# Patient Record
Sex: Male | Born: 1970
Health system: Southern US, Community
[De-identification: ages and names within clinical notes are randomized; demographics above are authoritative.]

## PROBLEM LIST (undated history)

## (undated) DIAGNOSIS — F32A Depression, unspecified: Secondary | ICD-10-CM

## (undated) DIAGNOSIS — F419 Anxiety disorder, unspecified: Secondary | ICD-10-CM

## (undated) DIAGNOSIS — G709 Myoneural disorder, unspecified: Secondary | ICD-10-CM

## (undated) DIAGNOSIS — I4891 Unspecified atrial fibrillation: Secondary | ICD-10-CM

## (undated) DIAGNOSIS — N289 Disorder of kidney and ureter, unspecified: Secondary | ICD-10-CM

## (undated) DIAGNOSIS — D689 Coagulation defect, unspecified: Secondary | ICD-10-CM

## (undated) DIAGNOSIS — D649 Anemia, unspecified: Secondary | ICD-10-CM

## (undated) DIAGNOSIS — R569 Unspecified convulsions: Secondary | ICD-10-CM

## (undated) DIAGNOSIS — R0600 Dyspnea, unspecified: Secondary | ICD-10-CM

## (undated) DIAGNOSIS — E119 Type 2 diabetes mellitus without complications: Secondary | ICD-10-CM

## (undated) DIAGNOSIS — D696 Thrombocytopenia, unspecified: Secondary | ICD-10-CM

## (undated) HISTORY — PX: OTHER SURGICAL HISTORY: SHX169

---

## 2014-09-22 ENCOUNTER — Encounter (HOSPITAL_COMMUNITY): Payer: Self-pay | Admitting: Emergency Medicine

## 2014-09-22 ENCOUNTER — Emergency Department (HOSPITAL_COMMUNITY): Payer: Self-pay

## 2014-09-22 ENCOUNTER — Emergency Department (HOSPITAL_COMMUNITY)
Admission: EM | Admit: 2014-09-22 | Discharge: 2014-09-22 | Disposition: A | Discharge status: Home / Self Care | Payer: Self-pay | Attending: Emergency Medicine | Admitting: Emergency Medicine

## 2014-09-22 DIAGNOSIS — Y939 Activity, unspecified: Secondary | ICD-10-CM | POA: Insufficient documentation

## 2014-09-22 DIAGNOSIS — Y929 Unspecified place or not applicable: Secondary | ICD-10-CM | POA: Insufficient documentation

## 2014-09-22 DIAGNOSIS — S0990XA Unspecified injury of head, initial encounter: Secondary | ICD-10-CM

## 2014-09-22 DIAGNOSIS — Z720 Tobacco use: Secondary | ICD-10-CM | POA: Insufficient documentation

## 2014-09-22 DIAGNOSIS — F1510 Other stimulant abuse, uncomplicated: Secondary | ICD-10-CM | POA: Insufficient documentation

## 2014-09-22 DIAGNOSIS — R55 Syncope and collapse: Secondary | ICD-10-CM | POA: Insufficient documentation

## 2014-09-22 DIAGNOSIS — W1839XA Other fall on same level, initial encounter: Secondary | ICD-10-CM | POA: Insufficient documentation

## 2014-09-22 DIAGNOSIS — S0003XA Contusion of scalp, initial encounter: Secondary | ICD-10-CM | POA: Insufficient documentation

## 2014-09-22 DIAGNOSIS — Y999 Unspecified external cause status: Secondary | ICD-10-CM | POA: Insufficient documentation

## 2014-09-22 DIAGNOSIS — R561 Post traumatic seizures: Secondary | ICD-10-CM | POA: Insufficient documentation

## 2014-09-22 DIAGNOSIS — F1410 Cocaine abuse, uncomplicated: Secondary | ICD-10-CM | POA: Insufficient documentation

## 2014-09-22 LAB — RAPID URINE DRUG SCREEN, HOSP PERFORMED
Amphetamines: POSITIVE — AB
Barbiturates: NOT DETECTED
Benzodiazepines: NOT DETECTED
Cocaine: POSITIVE — AB
Opiates: NOT DETECTED
Tetrahydrocannabinol: POSITIVE — AB

## 2014-09-22 LAB — BASIC METABOLIC PANEL
Anion gap: 10 (ref 5–15)
BUN: 8 mg/dL (ref 6–23)
CO2: 27 mmol/L (ref 19–32)
Calcium: 9.4 mg/dL (ref 8.4–10.5)
Chloride: 99 mmol/L (ref 96–112)
Creatinine, Ser: 0.98 mg/dL (ref 0.50–1.35)
GFR calc Af Amer: >90 mL/min (ref >90)
GFR calc non Af Amer: >90 mL/min (ref >90)
Glucose, Bld: 123 mg/dL — ABNORMAL HIGH (ref 70–99)
Potassium: 3.7 mmol/L (ref 3.5–5.1)
Sodium: 136 mmol/L (ref 135–145)

## 2014-09-22 LAB — CBC
HCT: 39.9 % (ref 39.0–52.0)
Hemoglobin: 13.7 g/dL (ref 13.0–17.0)
MCH: 29.7 pg (ref 26.0–34.0)
MCHC: 34.3 g/dL (ref 30.0–36.0)
MCV: 86.4 fL (ref 78.0–100.0)
Platelets: 209 10*3/uL (ref 150–400)
RBC: 4.62 MIL/uL (ref 4.22–5.81)
RDW: 14.8 % (ref 11.5–15.5)
WBC: 9.3 10*3/uL (ref 4.0–10.5)

## 2014-09-22 LAB — ETHANOL: Alcohol, Ethyl (B): <5 mg/dL (ref 0–9)

## 2014-09-22 LAB — CBG MONITORING, ED: Glucose-Capillary: 131 mg/dL — ABNORMAL HIGH (ref 70–99)

## 2014-09-22 MED ORDER — ACETAMINOPHEN 500 MG PO TABS
1000.0000 mg | ORAL_TABLET | Freq: Once | ORAL | Status: DC
  Filled 2014-09-22: qty 2

## 2014-09-22 NOTE — ED Provider Notes (Signed)
CSN: [id number]     Arrival date & time 09/22/14  2002 History   First MD Initiated Contact with Patient 09/22/14 2017     Chief Complaint  Patient presents with  . Seizures     (Consider location/radiation/quality/duration/timing/severity/associated sxs/prior Treatment) Patient is a 44 y.o. male presenting with head injury. The history is provided by the patient.  Head Injury Location:  Occipital Time since incident:  30 minutes Mechanism of injury: fall   Pain details:    Quality:  Aching   Severity:  Mild   Timing:  Constant   Progression:  Unchanged Chronicity:  New Relieved by:  None tried Worsened by:  Nothing tried Ineffective treatments:  None tried Associated symptoms: loss of consciousness and seizures (possible)   Associated symptoms: no blurred vision, no focal weakness, no nausea, no neck pain and no vomiting     History reviewed. No pertinent past medical history. Past Surgical History  Procedure Laterality Date  . Left kidney removed     No family history on file. History  Substance Use Topics  . Smoking status: Current Every Day Smoker  . Smokeless tobacco: Not on file  . Alcohol Use: 1.8 oz/week    3 Cans of beer per week    Review of Systems  Eyes: Negative for blurred vision.  Gastrointestinal: Negative for nausea and vomiting.  Musculoskeletal: Negative for neck pain.  Neurological: Positive for seizures (possible) and loss of consciousness. Negative for focal weakness.  All other systems reviewed and are negative.     Allergies  Review of patient's allergies indicates no known allergies.  Home Medications   Prior to Admission medications   Not on File   BP 153/94 mmHg  Pulse 81  Temp(Src) 98.2 F (36.8 C) (Oral)  Resp 18  Ht 5\' 4"  (1.626 m)  Wt 150 lb (68.04 kg)  BMI 25.73 kg/m2  SpO2 99% Physical Exam  Constitutional: He is oriented to person, place, and time. He appears well-developed and well-nourished. No distress.  HENT:   Head: Normocephalic.  Mouth/Throat: Oropharynx is clear and moist. No oropharyngeal exudate.  Scalp hematoma with small abrasion to left vertex of scalp. No underlying palpable deformity.  Eyes: Conjunctivae and EOM are normal. Pupils are equal, round, and reactive to light.  Neck: Normal range of motion. Neck supple.  Cardiovascular: Normal rate, regular rhythm, normal heart sounds and intact distal pulses.  Exam reveals no gallop and no friction rub.   No murmur heard. Pulmonary/Chest: Effort normal and breath sounds normal. No respiratory distress. He has no wheezes. He has no rales.  Abdominal: Soft. He exhibits no distension and no mass. There is no tenderness. There is no rebound and no guarding.  Musculoskeletal: Normal range of motion. He exhibits no edema or tenderness.  Lymphadenopathy:    He has no cervical adenopathy.  Neurological: He is alert and oriented to person, place, and time. He has normal strength. No cranial nerve deficit or sensory deficit. He displays a negative Romberg sign. Coordination and gait normal.  Final 5 strength in all extremities and equal. finger-nose-finger intact. Ambulatory without difficulty.  Skin: Skin is warm and dry. No rash noted. He is not diaphoretic.  Psychiatric: He has a normal mood and affect. His behavior is normal. Judgment and thought content normal.  Nursing note and vitals reviewed.   ED Course  Procedures (including critical care time) Labs Review Labs Reviewed  BASIC METABOLIC PANEL - Abnormal; Notable for the following:    Glucose,  Bld 123 (*)    All other components within normal limits  CBG MONITORING, ED - Abnormal; Notable for the following:    Glucose-Capillary 131 (*)    All other components within normal limits  CBC  ETHANOL  URINE RAPID DRUG SCREEN (HOSP PERFORMED)    Imaging Review Ct Head Wo Contrast   (if New Onset Seizure And/or Head Trauma)  09/22/2014   CLINICAL DATA:  Seizure.  EXAM: CT HEAD WITHOUT  CONTRAST  TECHNIQUE: Contiguous axial images were obtained from the base of the skull through the vertex without intravenous contrast.  COMPARISON:  None.  FINDINGS: Bony calvarium appears intact. No mass effect or midline shift is noted. Ventricular size is within normal limits. There is no evidence of mass lesion, hemorrhage or acute infarction.  IMPRESSION: Normal head CT.   Electronically Signed   By: Marijo Conception, M.D.   On: 09/22/2014 20:49     EKG Interpretation   Date/Time:  Thursday September 22 2014 20:12:00 EDT Ventricular Rate:  99 PR Interval:  158 QRS Duration: 82 QT Interval:  370 QTC Calculation: 475 R Axis:   81 Text Interpretation:  Sinus rhythm Abnormal R-wave progression, early  transition Baseline wander in lead(s) V6 No old tracing to compare  Confirmed by MILLER  MD, BRIAN ([id number]) on 09/22/2014 8:20:52 PM      MDM   Final diagnoses:  Head injuries, initial encounter  Seizures, post-traumatic    44 year old male with no past medical history presents with probable syncopal episode followed by possible seizure. He was working on a car when he felt lightheaded and dizzy. Family reports that he then stiffened up and fell backwards hitting his head. He did had less than 1 minute of rhythmic shaking type movements. He was then confused and slow to wake up but alert and oriented in the ambulance with EMS.  Afebrile and stable on arrival. Nonfocal neurologic exam. Differential includes seizure versus posttraumatic seizure versus myoclonic jerks following syncopal lips. EKG is normal sinus rhythm without ischemic changes, arrhythmia, Brugada, short PR intervals. We will obtain CT head as well as basic labs and UDS. C-spine cleared by [facility].  11 PM no further episodes during the emergency department stay. Workup unremarkable and CT head negative. Seizure either secondary to sickle upset with head trauma versus alcohol withdrawal. On exam he has no signs withdrawal and his  vital signs have been stable. He is no other indication for admission and will follow up with PCP. Resources provided.  Larence Penning, MD 09/22/14 [id number]  Noemi Chapel, MD 09/25/14 [phone]

## 2014-09-22 NOTE — Discharge Instructions (Signed)
Seizure, Adult A seizure means there is unusual activity in the brain. A seizure can cause changes in attention or behavior. Seizures often cause shaking (convulsions). Seizures often last from 30 seconds to 2 minutes. HOME CARE   If you are given medicines, take them exactly as told by your doctor.  Keep all doctor visits as told.  Do not swim or drive until your doctor says it is okay.  Teach others what to do if you have a seizure. They should:  Lay you on the ground.  Put a cushion under your head.  Loosen any tight clothing around your neck.  Turn you on your side.  Stay with you until you get better. GET HELP RIGHT AWAY IF:   The seizure lasts longer than 2 to 5 minutes.  The seizure is very bad.  The person does not wake up after the seizure.  The person's attention or behavior changes. Drive the person to the emergency room or call your local emergency services (911 in [place] MAKE SURE YOU:   Understand these instructions.  Will watch your condition.  Will get help right away if you are not doing well or get worse. Document Released: 12/11/2007 Document Revised: 09/16/2011 Document Reviewed: 06/12/2011 [employer] Patient Information 2015 [employer]. This information is not intended to replace advice given to you by your health care provider. Make sure you discuss any questions you have with your health care provider.  

## 2014-09-22 NOTE — ED Notes (Signed)
Per EMS Had witnessed seizure that lasted approx 1 minute. Per ems family stated he was standing up and body tensed up and went to the ground and started shaking all over. Pt has laceration to left back of head. Pt is alert and ox4.

## 2014-09-22 NOTE — ED Provider Notes (Signed)
44 year old male, denies having any medical history, presents after feeling lightheaded, falling and striking the left temporoparietal scalp causing a hematoma, having a resulting seizure like activity lasting approximately 1 minute and then resolving spontaneously. He has no memory of the event, he does endorse drinking daily, states that he did not drink today he does give varying details on this part of the story. He does state he drinks alcohol, possibly a 12 pack, he is very unclear on the amount. He did state that he had mild tremors today, no hallucinations, other than having a headache on the left side where he struck his head he has no other complaints including no fevers chills nausea vomiting diarrhea dysuria or abdominal pain chest pain or shortness of breath. On exam the patient appears calm, he is tearful, he has a 4-5c m hematoma on the left parieto-occipital area of the scalp, no overlying laceration of the skin has broken and a punctate area. No malocclusion, no hemotympanum, no raccoon eyes, or supple neck, no peripheral edema, follows commands without difficulty, soft abdomen, normal strength and sensation. Normal mental status. [name] the patient will need workup for first time seizure, possibly related to alcohol withdrawal, possibly intracranial abnormalities or trauma, electrolytes, labs, reassess. He does not appear to be in acute withdrawal at this time.  I saw and evaluated the patient, reviewed the resident's note and I agree with the findings and plan.   EKG Interpretation  Date/Time:  Thursday September 22 2014 20:12:00 EDT Ventricular Rate:  99 PR Interval:  158 QRS Duration: 82 QT Interval:  370 QTC Calculation: 475 R Axis:   81 Text Interpretation:  Sinus rhythm Abnormal R-wave progression, early transition Baseline wander in lead(s) V6 No old tracing to compare Confirmed by Nichlas Pitera  MD, [place] ([id number]) on 09/22/2014 8:20:52 PM      I personally interpreted the EKG as  well as the resident and agree with the interpretation on the resident's chart.  Final diagnoses:  Head injuries, initial encounter  Seizures, post-traumatic      Noemi Chapel, MD 09/25/14 [phone]

## 2014-09-30 ENCOUNTER — Emergency Department (HOSPITAL_COMMUNITY)
Admission: EM | Admit: 2014-09-30 | Discharge: 2014-09-30 | Discharge status: Against Medical Advice | Payer: No Typology Code available for payment source | Attending: Emergency Medicine | Admitting: Emergency Medicine

## 2014-09-30 ENCOUNTER — Emergency Department (HOSPITAL_COMMUNITY): Payer: No Typology Code available for payment source

## 2014-09-30 ENCOUNTER — Encounter (HOSPITAL_COMMUNITY): Payer: Self-pay | Admitting: Emergency Medicine

## 2014-09-30 DIAGNOSIS — Z720 Tobacco use: Secondary | ICD-10-CM | POA: Insufficient documentation

## 2014-09-30 DIAGNOSIS — I472 Ventricular tachycardia, unspecified: Secondary | ICD-10-CM

## 2014-09-30 DIAGNOSIS — S0990XA Unspecified injury of head, initial encounter: Secondary | ICD-10-CM | POA: Diagnosis present

## 2014-09-30 DIAGNOSIS — Z87448 Personal history of other diseases of urinary system: Secondary | ICD-10-CM | POA: Diagnosis not present

## 2014-09-30 DIAGNOSIS — Y92410 Unspecified street and highway as the place of occurrence of the external cause: Secondary | ICD-10-CM | POA: Diagnosis not present

## 2014-09-30 DIAGNOSIS — S0181XA Laceration without foreign body of other part of head, initial encounter: Secondary | ICD-10-CM | POA: Diagnosis not present

## 2014-09-30 DIAGNOSIS — Y999 Unspecified external cause status: Secondary | ICD-10-CM | POA: Insufficient documentation

## 2014-09-30 DIAGNOSIS — Y939 Activity, unspecified: Secondary | ICD-10-CM | POA: Insufficient documentation

## 2014-09-30 HISTORY — DX: Unspecified convulsions: R56.9

## 2014-09-30 HISTORY — DX: Disorder of kidney and ureter, unspecified: N28.9

## 2014-09-30 LAB — ETHANOL: Alcohol, Ethyl (B): 361 mg/dL — ABNORMAL HIGH (ref 0–9)

## 2014-09-30 LAB — BASIC METABOLIC PANEL
Anion gap: 12 (ref 5–15)
BUN: 14 mg/dL (ref 6–23)
CO2: 24 mmol/L (ref 19–32)
Calcium: 8.2 mg/dL — ABNORMAL LOW (ref 8.4–10.5)
Chloride: 103 mmol/L (ref 96–112)
Creatinine, Ser: 1.35 mg/dL (ref 0.50–1.35)
GFR calc Af Amer: 74 mL/min — ABNORMAL LOW (ref >90)
GFR calc non Af Amer: 64 mL/min — ABNORMAL LOW (ref >90)
Glucose, Bld: 95 mg/dL (ref 70–99)
Potassium: 3.9 mmol/L (ref 3.5–5.1)
Sodium: 139 mmol/L (ref 135–145)

## 2014-09-30 LAB — CBC
HCT: 42.7 % (ref 39.0–52.0)
Hemoglobin: 14.6 g/dL (ref 13.0–17.0)
MCH: 29.9 pg (ref 26.0–34.0)
MCHC: 34.2 g/dL (ref 30.0–36.0)
MCV: 87.5 fL (ref 78.0–100.0)
Platelets: 244 10*3/uL (ref 150–400)
RBC: 4.88 MIL/uL (ref 4.22–5.81)
RDW: 15.1 % (ref 11.5–15.5)
WBC: 6.2 10*3/uL (ref 4.0–10.5)

## 2014-09-30 LAB — ACETAMINOPHEN LEVEL: Acetaminophen (Tylenol), Serum: <10 ug/mL — ABNORMAL LOW (ref 10–30)

## 2014-09-30 LAB — TROPONIN I: Troponin I: <0.03 ng/mL (ref <0.031)

## 2014-09-30 LAB — SALICYLATE LEVEL: Salicylate Lvl: <4 mg/dL (ref 2.8–20.0)

## 2014-09-30 MED ORDER — AMIODARONE HCL IN DEXTROSE 360-4.14 MG/200ML-% IV SOLN
60.0000 mg/h | Freq: Once | INTRAVENOUS | Status: AC
  Administered 2014-09-30: 60 mg/h via INTRAVENOUS
  Filled 2014-09-30: qty 200

## 2014-09-30 MED ORDER — AMIODARONE LOAD VIA INFUSION
150.0000 mg | Freq: Once | INTRAVENOUS | Status: AC
  Administered 2014-09-30: 150 mg via INTRAVENOUS
  Filled 2014-09-30: qty 83.34

## 2014-09-30 MED ORDER — SODIUM CHLORIDE 0.9 % IV BOLUS (SEPSIS)
1000.0000 mL | Freq: Once | INTRAVENOUS | Status: AC
  Administered 2014-09-30: 1000 mL via INTRAVENOUS

## 2014-09-30 MED ORDER — AMIODARONE IV BOLUS ONLY 150 MG/100ML: 150.0000 mg | Freq: Once | INTRAVENOUS | Status: DC

## 2014-09-30 MED ORDER — IOHEXOL 300 MG/ML  SOLN
100.0000 mL | Freq: Once | INTRAMUSCULAR | Status: AC | PRN
  Administered 2014-09-30: 100 mL via INTRAVENOUS
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
## 2014-09-30 NOTE — ED Notes (Signed)
Patient pulling at wires, pulled out iv's,x 2 uncooperative with staff.  IV catheters located, tip noted to be intact, bleeding controlled. Pt states "i need to find my car and keys, i need to get out of here, i don't have time for this".  Pt redirected, plan of care explained, pt placed back in bed and reattached to monitor, dr Mingo Amber notified, at bedside.
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
## 2014-09-30 NOTE — ED Notes (Signed)
Backboard removed by Dr. Mingo Amber.
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
## 2014-09-30 NOTE — ED Notes (Signed)
Patient transported to CT via stretcher.

## 2014-09-30 NOTE — ED Notes (Signed)
Pt returned from ct, in bed on monitor.  Pt repeatedly attempting to leave bed, stating he is going to get his clothes and his keys and his wallet.  Pt redirected and reoriented, resting in bed at this time

## 2015-05-18 ENCOUNTER — Encounter (HOSPITAL_COMMUNITY): Payer: Self-pay | Admitting: Neurology

## 2015-05-18 ENCOUNTER — Emergency Department (HOSPITAL_COMMUNITY): Payer: Self-pay

## 2015-05-18 ENCOUNTER — Emergency Department (HOSPITAL_COMMUNITY)
Admission: EM | Admit: 2015-05-18 | Discharge: 2015-05-18 | Disposition: A | Discharge status: Home / Self Care | Payer: Self-pay | Attending: Emergency Medicine | Admitting: Emergency Medicine

## 2015-05-18 DIAGNOSIS — R569 Unspecified convulsions: Secondary | ICD-10-CM | POA: Insufficient documentation

## 2015-05-18 DIAGNOSIS — S0993XA Unspecified injury of face, initial encounter: Secondary | ICD-10-CM | POA: Insufficient documentation

## 2015-05-18 DIAGNOSIS — R42 Dizziness and giddiness: Secondary | ICD-10-CM | POA: Insufficient documentation

## 2015-05-18 DIAGNOSIS — X58XXXA Exposure to other specified factors, initial encounter: Secondary | ICD-10-CM | POA: Insufficient documentation

## 2015-05-18 DIAGNOSIS — Y998 Other external cause status: Secondary | ICD-10-CM | POA: Insufficient documentation

## 2015-05-18 DIAGNOSIS — T300 Burn of unspecified body region, unspecified degree: Secondary | ICD-10-CM

## 2015-05-18 DIAGNOSIS — T23271A Burn of second degree of right wrist, initial encounter: Secondary | ICD-10-CM | POA: Insufficient documentation

## 2015-05-18 DIAGNOSIS — Y9389 Activity, other specified: Secondary | ICD-10-CM | POA: Insufficient documentation

## 2015-05-18 DIAGNOSIS — T22231A Burn of second degree of right upper arm, initial encounter: Secondary | ICD-10-CM | POA: Insufficient documentation

## 2015-05-18 DIAGNOSIS — Z720 Tobacco use: Secondary | ICD-10-CM | POA: Insufficient documentation

## 2015-05-18 DIAGNOSIS — Y9289 Other specified places as the place of occurrence of the external cause: Secondary | ICD-10-CM | POA: Insufficient documentation

## 2015-05-18 DIAGNOSIS — X16XXXA Contact with hot heating appliances, radiators and pipes, initial encounter: Secondary | ICD-10-CM | POA: Insufficient documentation

## 2015-05-18 LAB — BASIC METABOLIC PANEL
Anion gap: 17 — ABNORMAL HIGH (ref 5–15)
BUN: <5 mg/dL — ABNORMAL LOW (ref 6–20)
CO2: 21 mmol/L — ABNORMAL LOW (ref 22–32)
Calcium: 9.3 mg/dL (ref 8.9–10.3)
Chloride: 94 mmol/L — ABNORMAL LOW (ref 101–111)
Creatinine, Ser: 1 mg/dL (ref 0.61–1.24)
GFR calc Af Amer: >60 mL/min (ref >60)
GFR calc non Af Amer: >60 mL/min (ref >60)
Glucose, Bld: 130 mg/dL — ABNORMAL HIGH (ref 65–99)
Potassium: 3.3 mmol/L — ABNORMAL LOW (ref 3.5–5.1)
Sodium: 132 mmol/L — ABNORMAL LOW (ref 135–145)

## 2015-05-18 LAB — CBC WITH DIFFERENTIAL/PLATELET
Basophils Absolute: 0 10*3/uL (ref 0.0–0.1)
Basophils Relative: 0 %
Eosinophils Absolute: 0 10*3/uL (ref 0.0–0.7)
Eosinophils Relative: 0 %
HCT: 40.2 % (ref 39.0–52.0)
Hemoglobin: 13.8 g/dL (ref 13.0–17.0)
Lymphocytes Relative: 12 %
Lymphs Abs: 1.1 10*3/uL (ref 0.7–4.0)
MCH: 30.8 pg (ref 26.0–34.0)
MCHC: 34.3 g/dL (ref 30.0–36.0)
MCV: 89.7 fL (ref 78.0–100.0)
Monocytes Absolute: 0.9 10*3/uL (ref 0.1–1.0)
Monocytes Relative: 10 %
Neutro Abs: 7.1 10*3/uL (ref 1.7–7.7)
Neutrophils Relative %: 78 %
Platelets: 184 10*3/uL (ref 150–400)
RBC: 4.48 MIL/uL (ref 4.22–5.81)
RDW: 14 % (ref 11.5–15.5)
WBC: 9.1 10*3/uL (ref 4.0–10.5)

## 2015-05-18 MED ORDER — SODIUM CHLORIDE 0.9 % IV BOLUS (SEPSIS)
1000.0000 mL | Freq: Once | INTRAVENOUS | Status: AC
  Administered 2015-05-18: 1000 mL via INTRAVENOUS

## 2015-05-18 MED ORDER — LEVETIRACETAM 500 MG PO TABS
500.0000 mg | ORAL_TABLET | Freq: Two times a day (BID) | ORAL | Status: DC
Start: 2015-05-18 — End: 2018-05-26

## 2015-05-18 MED ORDER — GADOBENATE DIMEGLUMINE 529 MG/ML IV SOLN
15.0000 mL | Freq: Once | INTRAVENOUS | Status: AC | PRN
  Administered 2015-05-18: 13 mL via INTRAVENOUS

## 2015-05-18 MED ORDER — SODIUM CHLORIDE 0.9 % IV SOLN
1000.0000 mg | Freq: Once | INTRAVENOUS | Status: AC
  Administered 2015-05-18: 1000 mg via INTRAVENOUS
  Filled 2015-05-18: qty 10
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
## 2015-05-18 NOTE — ED Notes (Signed)
Patient ambulated easily.

## 2015-05-18 NOTE — ED Notes (Signed)
Patient transported to MRI 

## 2015-07-03 ENCOUNTER — Encounter (HOSPITAL_COMMUNITY): Payer: Self-pay | Admitting: Emergency Medicine

## 2015-07-03 ENCOUNTER — Emergency Department (HOSPITAL_COMMUNITY)
Admission: EM | Admit: 2015-07-03 | Discharge: 2015-07-03 | Disposition: A | Discharge status: Home / Self Care | Payer: Self-pay | Attending: Emergency Medicine | Admitting: Emergency Medicine

## 2015-07-03 DIAGNOSIS — Z87448 Personal history of other diseases of urinary system: Secondary | ICD-10-CM | POA: Insufficient documentation

## 2015-07-03 DIAGNOSIS — R569 Unspecified convulsions: Secondary | ICD-10-CM | POA: Insufficient documentation

## 2015-07-03 DIAGNOSIS — Z79899 Other long term (current) drug therapy: Secondary | ICD-10-CM | POA: Insufficient documentation

## 2015-07-03 DIAGNOSIS — F17200 Nicotine dependence, unspecified, uncomplicated: Secondary | ICD-10-CM | POA: Insufficient documentation

## 2015-07-03 LAB — CBC WITH DIFFERENTIAL/PLATELET
Basophils Absolute: 0 10*3/uL (ref 0.0–0.1)
Basophils Relative: 0 %
Eosinophils Absolute: 0 10*3/uL (ref 0.0–0.7)
Eosinophils Relative: 0 %
HCT: 39.7 % (ref 39.0–52.0)
Hemoglobin: 13.4 g/dL (ref 13.0–17.0)
Lymphocytes Relative: 14 %
Lymphs Abs: 1.5 10*3/uL (ref 0.7–4.0)
MCH: 31.2 pg (ref 26.0–34.0)
MCHC: 33.8 g/dL (ref 30.0–36.0)
MCV: 92.3 fL (ref 78.0–100.0)
Monocytes Absolute: 0.7 10*3/uL (ref 0.1–1.0)
Monocytes Relative: 7 %
Neutro Abs: 8 10*3/uL — ABNORMAL HIGH (ref 1.7–7.7)
Neutrophils Relative %: 79 %
Platelets: 223 10*3/uL (ref 150–400)
RBC: 4.3 MIL/uL (ref 4.22–5.81)
RDW: 14 % (ref 11.5–15.5)
WBC: 10.2 10*3/uL (ref 4.0–10.5)

## 2015-07-03 LAB — BASIC METABOLIC PANEL
Anion gap: 13 (ref 5–15)
BUN: 9 mg/dL (ref 6–20)
CO2: 25 mmol/L (ref 22–32)
Calcium: 9.1 mg/dL (ref 8.9–10.3)
Chloride: 96 mmol/L — ABNORMAL LOW (ref 101–111)
Creatinine, Ser: 0.73 mg/dL (ref 0.61–1.24)
GFR calc Af Amer: >60 mL/min (ref >60)
GFR calc non Af Amer: >60 mL/min (ref >60)
Glucose, Bld: 89 mg/dL (ref 65–99)
Potassium: 4.7 mmol/L (ref 3.5–5.1)
Sodium: 134 mmol/L — ABNORMAL LOW (ref 135–145)

## 2015-07-03 LAB — RAPID URINE DRUG SCREEN, HOSP PERFORMED
Amphetamines: NOT DETECTED
Barbiturates: NOT DETECTED
Benzodiazepines: NOT DETECTED
Cocaine: NOT DETECTED
Opiates: NOT DETECTED
Tetrahydrocannabinol: NOT DETECTED

## 2015-07-03 LAB — ETHANOL: Alcohol, Ethyl (B): <5 mg/dL (ref <5)

## 2015-07-03 LAB — CBG MONITORING, ED: Glucose-Capillary: 91 mg/dL (ref 65–99)

## 2015-07-03 MED ORDER — LORAZEPAM 1 MG PO TABS
1.0000 mg | ORAL_TABLET | Freq: Once | ORAL | Status: AC
  Administered 2015-07-03: 1 mg via ORAL
  Filled 2015-07-03: qty 1

## 2015-07-03 MED ORDER — PHENYTOIN SODIUM EXTENDED 100 MG PO CAPS: 300.0000 mg | ORAL_CAPSULE | Freq: Every day | ORAL | Status: DC

## 2015-07-03 MED ORDER — SODIUM CHLORIDE 0.9 % IV BOLUS (SEPSIS)
1000.0000 mL | Freq: Once | INTRAVENOUS | Status: AC
  Administered 2015-07-03: 1000 mL via INTRAVENOUS

## 2015-07-03 MED ORDER — LORAZEPAM 1 MG PO TABS: 1.0000 mg | ORAL_TABLET | Freq: Three times a day (TID) | ORAL | Status: DC | PRN

## 2015-07-03 MED ORDER — LORAZEPAM 2 MG/ML IJ SOLN
0.5000 mg | Freq: Once | INTRAMUSCULAR | Status: AC
  Administered 2015-07-03: 0.5 mg via INTRAVENOUS
  Filled 2015-07-03: qty 1

## 2015-07-03 NOTE — Progress Notes (Signed)
Entered in EPIC d/c instructions  Please use the resources provided to you in emergency room by case manager to assist with doctor for follow up On 07/06/2015 These [place] uninsured resources provide possible primary care providers, resources for discounted medications, housing, dental resources, affordable care act information, plus other resources for [place] uninsured resources provide possible primary care providers, resources for discounted medications, housing, dental resources, affordable care act information, plus other resources for [employer]

## 2015-07-03 NOTE — Discharge Instructions (Signed)
No alcohol. Start prescription for seizure disorder. Also medication for restlessness. Referral to neurologist. No driving until cleared by specialist

## 2015-07-03 NOTE — Progress Notes (Signed)
CM spoke with pt who confirms uninsured [employer] resident with no pcp.  CM discussed and provided written information for uninsured accepting pcps, discussed the importance of pcp vs EDP services for f/u care, www.needymeds.org, www.goodrx.com, discounted pharmacies and other [employer] such as [employer], affordable care act, financial assistance, uninsured dental services, [place] med assist, DSS and  health department  Reviewed resources for [employer] uninsured accepting pcps like [facility], family medicine at [employer], [facility], [facility], local urgent care centers, [facility], [facility] family practice, general medical clinics, family services of the [place], [facility] urgent care plus others, medication resources, [facility] out patient pharmacies and housing Pt voiced understanding and appreciation of resources provided   Provided [facility] contact information Pt agreed to a referral Cm completed referral Pt to be contact by [facility] clinical liason Cm answered question of pt's parents at bedside related DSS, disability, paying [facility] bill etc Mother has another son presently disabled who had a bill medical paid for by DSS Referred her to DSS and [facility] payment contact number  Pt states he has not been drinking  Teach back method used and pt able to tell CM he would use goodrx discount card to assist with price of keppra at [place] or [employer] stores and importance of getting a pcp using [facility] service Pt never had an orange card but is familiar with [facility] and friends who had an orange card The father also had an orange card

## 2015-07-03 NOTE — ED Notes (Signed)
Bed: WA07 Expected date:  Expected time:  Means of arrival:  Comments: Ems- seizure 
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
## 2015-07-05 NOTE — ED Provider Notes (Signed)
CSN: [id number]     Arrival date & time 07/03/15  1118 History   First MD Initiated Contact with Patient 07/03/15 1341     Chief Complaint  Patient presents with  . Seizures     (Consider location/radiation/quality/duration/timing/severity/associated sxs/prior Treatment) HPI.... Patient with known seizure history presents with suspected seizure at 10:30 this morning. He is presently on no medications, although Keppra has been recommended. This was not started secondary to cost. EMS reports postictal behavior at the scene where he was picked up. An MRI scan recently revealed no acute pathology. His care was discussed with neurologist on May 18, 2015 and Keppra was recommended. Patient does drink alcohol. No street drugs. He appears to be more normal at this time.  Past Medical History  Diagnosis Date  . Seizures ([place])   . Renal disorder     states kidney removal when he was a baby   Past Surgical History  Procedure Laterality Date  . Left kidney removed     No family history on file. Social History  Substance Use Topics  . Smoking status: Current Every Day Smoker -- 2.00 packs/day for 25 years  . Smokeless tobacco: None  . Alcohol Use: 1.8 oz/week    3 Cans of beer per week    Review of Systems  All other systems reviewed and are negative.     Allergies  Review of patient's allergies indicates no known allergies.  Home Medications   Prior to Admission medications   Medication Sig Start Date End Date Taking? Authorizing Provider  levETIRAcetam (KEPPRA) 500 MG tablet Take 1 tablet (500 mg total) by mouth 2 (two) times daily. 05/18/15   Merrily Pew, MD  LORazepam (ATIVAN) 1 MG tablet Take 1 tablet (1 mg total) by mouth 3 (three) times daily as needed for anxiety. 07/03/15   Nat Christen, MD  phenytoin (DILANTIN) 100 MG ER capsule Take 3 capsules (300 mg total) by mouth daily. 07/03/15   Nat Christen, MD   BP 131/98 mmHg  Pulse 90  Temp(Src) 98.6 F (37 C) (Oral)   Resp 21  SpO2 99% Physical Exam  Constitutional: He is oriented to person, place, and time. He appears well-developed and well-nourished.  HENT:  Head: Normocephalic and atraumatic.  Eyes: Conjunctivae and EOM are normal. Pupils are equal, round, and reactive to light.  Neck: Normal range of motion. Neck supple.  Cardiovascular: Normal rate and regular rhythm.   Pulmonary/Chest: Effort normal and breath sounds normal.  Abdominal: Soft. Bowel sounds are normal.  Musculoskeletal: Normal range of motion.  Neurological: He is alert and oriented to person, place, and time.  Skin: Skin is warm and dry.  Psychiatric: He has a normal mood and affect. His behavior is normal.  Nursing note and vitals reviewed.   ED Course  Procedures (including critical care time) Labs Review Labs Reviewed  CBC WITH DIFFERENTIAL/PLATELET - Abnormal; Notable for the following:    Neutro Abs 8.0 (*)    All other components within normal limits  BASIC METABOLIC PANEL - Abnormal; Notable for the following:    Sodium 134 (*)    Chloride 96 (*)    All other components within normal limits  URINE RAPID DRUG SCREEN, HOSP PERFORMED  ETHANOL  CBG MONITORING, ED    Imaging Review No results found. I have personally reviewed and evaluated these images and lab results as part of my medical decision-making.   EKG Interpretation None      MDM   Final diagnoses:  Seizure ([place])    Vital signs are stable. No neurological deficits. Drug screen and alcohol level negative. Glucose normal. Discussed findings with patient and his parents. Will start Dilantin secondary to cost issues. Referral to neurologist. No driving. Also prescription for Ativan for restlessness.    Nat Christen, MD 07/05/15 [phone]

## 2015-09-13 ENCOUNTER — Ambulatory Visit: Payer: Self-pay | Admitting: Family Medicine
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
## 2016-05-21 ENCOUNTER — Encounter (HOSPITAL_COMMUNITY): Payer: Self-pay | Admitting: Emergency Medicine

## 2016-05-21 ENCOUNTER — Emergency Department (HOSPITAL_COMMUNITY): Payer: Self-pay

## 2016-05-21 DIAGNOSIS — R0789 Other chest pain: Secondary | ICD-10-CM | POA: Insufficient documentation

## 2016-05-21 DIAGNOSIS — F17200 Nicotine dependence, unspecified, uncomplicated: Secondary | ICD-10-CM | POA: Insufficient documentation

## 2016-05-21 LAB — BASIC METABOLIC PANEL
Anion gap: 13 (ref 5–15)
BUN: 6 mg/dL (ref 6–20)
CO2: 22 mmol/L (ref 22–32)
Calcium: 9.3 mg/dL (ref 8.9–10.3)
Chloride: 104 mmol/L (ref 101–111)
Creatinine, Ser: 0.78 mg/dL (ref 0.61–1.24)
GFR calc Af Amer: >60 mL/min (ref >60)
GFR calc non Af Amer: >60 mL/min (ref >60)
Glucose, Bld: 121 mg/dL — ABNORMAL HIGH (ref 65–99)
Potassium: 4.6 mmol/L (ref 3.5–5.1)
Sodium: 139 mmol/L (ref 135–145)

## 2016-05-21 LAB — I-STAT TROPONIN, ED: Troponin i, poc: 0 ng/mL (ref 0.00–0.08)

## 2016-05-21 LAB — CBC
HCT: 40.9 % (ref 39.0–52.0)
Hemoglobin: 13.8 g/dL (ref 13.0–17.0)
MCH: 31 pg (ref 26.0–34.0)
MCHC: 33.7 g/dL (ref 30.0–36.0)
MCV: 91.9 fL (ref 78.0–100.0)
Platelets: 187 10*3/uL (ref 150–400)
RBC: 4.45 MIL/uL (ref 4.22–5.81)
RDW: 14.9 % (ref 11.5–15.5)
WBC: 9.6 10*3/uL (ref 4.0–10.5)

## 2016-05-21 NOTE — ED Triage Notes (Signed)
Pt reports CP intermittent with movement since Friday. Hx seizures. Denies SHOB. States pain when rolling over, sitting back.

## 2016-05-22 ENCOUNTER — Emergency Department (HOSPITAL_COMMUNITY)
Admission: EM | Admit: 2016-05-22 | Discharge: 2016-05-22 | Disposition: A | Discharge status: Home / Self Care | Payer: Self-pay | Attending: Emergency Medicine | Admitting: Emergency Medicine

## 2016-05-22 DIAGNOSIS — R0789 Other chest pain: Secondary | ICD-10-CM

## 2016-05-22 MED ORDER — CYCLOBENZAPRINE HCL 10 MG PO TABS: 10.0000 mg | ORAL_TABLET | Freq: Three times a day (TID) | ORAL | 0 refills | Status: DC | PRN

## 2016-05-22 MED ORDER — IBUPROFEN 800 MG PO TABS: 800.0000 mg | ORAL_TABLET | Freq: Three times a day (TID) | ORAL | 0 refills | Status: DC

## 2016-05-22 NOTE — ED Notes (Signed)
EDP at bedside  

## 2016-05-22 NOTE — ED Provider Notes (Signed)
[facility] DEPT Provider Note   CSN: [id number] Arrival date & time: 05/21/16  2010  By signing my name below, I, Gwenlyn Fudge, attest that this documentation has been prepared under the direction and in the presence of Orpah Greek, MD. Electronically Signed: Gwenlyn Fudge, ED Scribe. 05/22/16. 12:27 AM.   History   Chief Complaint Chief Complaint  Patient presents with  . Chest Pain   The history is provided by the patient. No language interpreter was used.   HPI Comments: Gary Mitchell is a 45 y.o. male with PMHx of Seizures who presents to the Emergency Department complaining of gradual onset, intermittent, centralized chest pain onset 4 days. Pain is exacerbated with movement and palpation. Whenever he lays down and places his shoulder blades down, a sharp pain radiates to his central chest area. He denies shortness of breath.  Past Medical History:  Diagnosis Date  . 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 Seizures ([place])     There are no active problems to display for this patient.   Past Surgical History:  Procedure Laterality Date  . left kidney removed      Home Medications    Prior to Admission medications   Medication Sig Start Date End Date Taking? Authorizing Provider  phenytoin (DILANTIN) 100 MG ER capsule Take 3 capsules (300 mg total) by mouth daily. 07/03/15  Yes Nat Christen, MD  cyclobenzaprine (FLEXERIL) 10 MG tablet Take 1 tablet (10 mg total) by mouth 3 (three) times daily as needed for muscle spasms. 05/22/16   Orpah Greek, MD  ibuprofen (ADVIL,MOTRIN) 800 MG tablet Take 1 tablet (800 mg total) by mouth 3 (three) times daily. 05/22/16   Orpah Greek, MD  levETIRAcetam (KEPPRA) 500 MG tablet Take 1 tablet (500 mg total) by mouth 2 (two) times daily. Patient not taking: Reported on 05/21/2016 05/18/15   Merrily Pew, MD  LORazepam (ATIVAN) 1 MG tablet Take 1 tablet (1 mg total) by mouth 3 (three)  times daily as needed for anxiety. Patient not taking: Reported on 05/21/2016 07/03/15   Nat Christen, MD    Family History No family history on file.  Social History Social History  Substance Use Topics  . Smoking status: Current Every Day Smoker    Packs/day: 2.00    Years: 25.00  . Smokeless tobacco: Never Used  . Alcohol use 1.8 oz/week    3 Cans of beer per week     Allergies   Iohexol   Review of Systems Review of Systems  Constitutional: Negative for fever.  Respiratory: Negative for shortness of breath.   Cardiovascular: Positive for chest pain.  All other systems reviewed and are negative.    Physical Exam Updated Vital Signs BP 132/98   Pulse 95   Temp 97.4 F (36.3 C) (Oral)   Resp 19   Ht 5\' 4"  (1.626 m)   Wt 150 lb (68 kg)   SpO2 100%   BMI 25.75 kg/m   Physical Exam  Constitutional: He is oriented to person, place, and time. He appears well-developed and well-nourished. No distress.  HENT:  Head: Normocephalic and atraumatic.  Right Ear: Hearing normal.  Left Ear: Hearing normal.  Nose: Nose normal.  Mouth/Throat: Oropharynx is clear and moist and mucous membranes are normal.  Eyes: Conjunctivae and EOM are normal. Pupils are equal, round, and reactive to light.  Neck: Normal range of motion. Neck supple.  Cardiovascular: Regular rhythm, S1  normal and S2 normal.  Exam reveals no gallop and no friction rub.   No murmur heard. Pulmonary/Chest: Effort normal and breath sounds normal. No respiratory distress. He exhibits no tenderness.  Abdominal: Soft. Normal appearance and bowel sounds are normal. There is no hepatosplenomegaly. There is no tenderness. There is no rebound, no guarding, no tenderness at McBurney's point and negative Murphy's sign. No hernia.  Musculoskeletal: Normal range of motion.  Anterior chest wall tenderness Bilateral midthoracic midline tenderneess  Neurological: He is alert and oriented to person, place, and time. He has  normal strength. No cranial nerve deficit or sensory deficit. Coordination normal. GCS eye subscore is 4. GCS verbal subscore is 5. GCS motor subscore is 6.  Skin: Skin is warm, dry and intact. No rash noted. No cyanosis.  Psychiatric: He has a normal mood and affect. His speech is normal and behavior is normal. Thought content normal.  Nursing note and vitals reviewed.  ED Treatments / Results  DIAGNOSTIC STUDIES: Oxygen Saturation is 99% on RA, normal by my interpretation.    COORDINATION OF CARE: 12:26 AM Discussed treatment plan with pt at bedside which includes EKG and pt agreed to plan.  Labs (all labs ordered are listed, but only abnormal results are displayed) Labs Reviewed  BASIC METABOLIC PANEL - Abnormal; Notable for the following:       Result Value   Glucose, Bld 121 (*)    All other components within normal limits  CBC  I-STAT TROPOININ, ED    EKG  EKG Interpretation  Date/Time:  Tuesday May 21 2016 20:12:41 EST Ventricular Rate:  113 PR Interval:  158 QRS Duration: 78 QT Interval:  326 QTC Calculation: 447 R Axis:   57 Text Interpretation:  Sinus tachycardia Otherwise normal ECG Confirmed by POLLINA  MD, CHRISTOPHER [phone]) on 05/22/2016 12:21:54 AM       Radiology Dg Chest 2 View  Result Date: 05/21/2016 CLINICAL DATA:  Acute onset of generalized chest pain. Pain radiates from the back. Initial encounter. EXAM: CHEST  2 VIEW COMPARISON:  CT of the chest performed 09/30/2014 FINDINGS: The lungs are well-aerated and clear. There is no evidence of focal opacification, pleural effusion or pneumothorax. The heart is normal in size; the mediastinal contour is within normal limits. No acute osseous abnormalities are seen. There is chronic deformity of the left lateral sixth rib. IMPRESSION: No acute cardiopulmonary process seen. Electronically Signed   By: Garald Balding M.D.   On: 05/21/2016 21:24    Procedures Procedures (including critical care  time)  Medications Ordered in ED Medications - No data to display   Initial Impression / Assessment and Plan / ED Course  I have reviewed the triage vital signs and the nursing notes.  Pertinent labs & imaging results that were available during my care of the patient were reviewed by me and considered in my medical decision making (see chart for details).  Clinical Course   Presents with complaints of chest pain. Patient reports anterior chest pain as well as back pain occurs when he bends over. Patient reports that he has difficulty finding a comfortable position when he is lying in his bed as well. Small changes in position causes the pain to occur. When he is at rest there is no pain. Pain is extremely atypical for cardiac etiology. He has reproducible pain on examination. There are no pulmonary symptoms. He is not short of breath or hypoxia, doubt PE. Presentation consistent with musculoskeletal pain and will treat with  analgesia and rest.  I personally performed the services described in this documentation, which was scribed in my presence. The recorded information has been reviewed and is accurate.  Final Clinical Impressions(s) / ED Diagnoses   Final diagnoses:  Chest wall pain    New Prescriptions New Prescriptions   CYCLOBENZAPRINE (FLEXERIL) 10 MG TABLET    Take 1 tablet (10 mg total) by mouth 3 (three) times daily as needed for muscle spasms.   IBUPROFEN (ADVIL,MOTRIN) 800 MG TABLET    Take 1 tablet (800 mg total) by mouth 3 (three) times daily.     Orpah Greek, MD 05/22/16 [phone]

## 2017-03-05 ENCOUNTER — Encounter (HOSPITAL_COMMUNITY): Payer: Self-pay | Admitting: Emergency Medicine

## 2017-03-05 ENCOUNTER — Emergency Department (HOSPITAL_COMMUNITY)
Admission: EM | Admit: 2017-03-05 | Discharge: 2017-03-05 | Disposition: A | Discharge status: Home / Self Care | Payer: Self-pay | Attending: Emergency Medicine | Admitting: Emergency Medicine

## 2017-03-05 DIAGNOSIS — R748 Abnormal levels of other serum enzymes: Secondary | ICD-10-CM | POA: Insufficient documentation

## 2017-03-05 DIAGNOSIS — G40909 Epilepsy, unspecified, not intractable, without status epilepticus: Secondary | ICD-10-CM | POA: Insufficient documentation

## 2017-03-05 DIAGNOSIS — F17200 Nicotine dependence, unspecified, uncomplicated: Secondary | ICD-10-CM | POA: Insufficient documentation

## 2017-03-05 DIAGNOSIS — E860 Dehydration: Secondary | ICD-10-CM | POA: Insufficient documentation

## 2017-03-05 LAB — RAPID URINE DRUG SCREEN, HOSP PERFORMED
Amphetamines: NOT DETECTED
Barbiturates: NOT DETECTED
Benzodiazepines: NOT DETECTED
Cocaine: NOT DETECTED
Opiates: NOT DETECTED
Tetrahydrocannabinol: NOT DETECTED

## 2017-03-05 LAB — CBC WITH DIFFERENTIAL/PLATELET
Basophils Absolute: 0 10*3/uL (ref 0.0–0.1)
Basophils Relative: 0 %
Eosinophils Absolute: 0 10*3/uL (ref 0.0–0.7)
Eosinophils Relative: 0 %
HCT: 35.5 % — ABNORMAL LOW (ref 39.0–52.0)
Hemoglobin: 12.2 g/dL — ABNORMAL LOW (ref 13.0–17.0)
Lymphocytes Relative: 17 %
Lymphs Abs: 1.8 10*3/uL (ref 0.7–4.0)
MCH: 31.9 pg (ref 26.0–34.0)
MCHC: 34.4 g/dL (ref 30.0–36.0)
MCV: 92.9 fL (ref 78.0–100.0)
Monocytes Absolute: 1.3 10*3/uL — ABNORMAL HIGH (ref 0.1–1.0)
Monocytes Relative: 12 %
Neutro Abs: 7.5 10*3/uL (ref 1.7–7.7)
Neutrophils Relative %: 71 %
Platelets: 212 10*3/uL (ref 150–400)
RBC: 3.82 MIL/uL — ABNORMAL LOW (ref 4.22–5.81)
RDW: 14.4 % (ref 11.5–15.5)
WBC: 10.6 10*3/uL — ABNORMAL HIGH (ref 4.0–10.5)

## 2017-03-05 LAB — URINALYSIS, ROUTINE W REFLEX MICROSCOPIC
Bilirubin Urine: NEGATIVE
Glucose, UA: NEGATIVE mg/dL
Ketones, ur: 20 mg/dL — AB
Leukocytes, UA: NEGATIVE
Nitrite: NEGATIVE
Protein, ur: >=300 mg/dL — AB
Specific Gravity, Urine: 1.012 (ref 1.005–1.030)
Squamous Epithelial / LPF: NONE SEEN
pH: 5 (ref 5.0–8.0)

## 2017-03-05 LAB — COMPREHENSIVE METABOLIC PANEL
ALT: 90 U/L — ABNORMAL HIGH (ref 17–63)
ALT: 82 U/L — ABNORMAL HIGH (ref 17–63)
AST: 293 U/L — ABNORMAL HIGH (ref 15–41)
AST: 253 U/L — ABNORMAL HIGH (ref 15–41)
Albumin: 4.3 g/dL (ref 3.5–5.0)
Albumin: 4.2 g/dL (ref 3.5–5.0)
Alkaline Phosphatase: 159 U/L — ABNORMAL HIGH (ref 38–126)
Alkaline Phosphatase: 159 U/L — ABNORMAL HIGH (ref 38–126)
Anion gap: 23 — ABNORMAL HIGH (ref 5–15)
Anion gap: 11 (ref 5–15)
BUN: <5 mg/dL — ABNORMAL LOW (ref 6–20)
BUN: <5 mg/dL — ABNORMAL LOW (ref 6–20)
CO2: 14 mmol/L — ABNORMAL LOW (ref 22–32)
CO2: 22 mmol/L (ref 22–32)
Calcium: 9.2 mg/dL (ref 8.9–10.3)
Calcium: 8.7 mg/dL — ABNORMAL LOW (ref 8.9–10.3)
Chloride: 98 mmol/L — ABNORMAL LOW (ref 101–111)
Chloride: 102 mmol/L (ref 101–111)
Creatinine, Ser: 0.77 mg/dL (ref 0.61–1.24)
Creatinine, Ser: 0.47 mg/dL — ABNORMAL LOW (ref 0.61–1.24)
GFR calc Af Amer: >60 mL/min (ref >60)
GFR calc Af Amer: >60 mL/min (ref >60)
GFR calc non Af Amer: >60 mL/min (ref >60)
GFR calc non Af Amer: >60 mL/min (ref >60)
Glucose, Bld: 89 mg/dL (ref 65–99)
Glucose, Bld: 116 mg/dL — ABNORMAL HIGH (ref 65–99)
Potassium: 3.2 mmol/L — ABNORMAL LOW (ref 3.5–5.1)
Potassium: 3.6 mmol/L (ref 3.5–5.1)
Sodium: 135 mmol/L (ref 135–145)
Sodium: 135 mmol/L (ref 135–145)
Total Bilirubin: 1 mg/dL (ref 0.3–1.2)
Total Bilirubin: 1.4 mg/dL — ABNORMAL HIGH (ref 0.3–1.2)
Total Protein: 7.9 g/dL (ref 6.5–8.1)
Total Protein: 7.8 g/dL (ref 6.5–8.1)

## 2017-03-05 LAB — ETHANOL: Alcohol, Ethyl (B): 10 mg/dL — ABNORMAL HIGH (ref <5)

## 2017-03-05 LAB — PHENYTOIN LEVEL, TOTAL: Phenytoin Lvl: <2.5 ug/mL — ABNORMAL LOW (ref 10.0–20.0)

## 2017-03-05 LAB — CK: Total CK: 309 U/L (ref 49–397)

## 2017-03-05 MED ORDER — POTASSIUM CHLORIDE CRYS ER 20 MEQ PO TBCR
40.0000 meq | EXTENDED_RELEASE_TABLET | Freq: Once | ORAL | Status: AC
  Administered 2017-03-05: 40 meq via ORAL
  Filled 2017-03-05: qty 2

## 2017-03-05 MED ORDER — SODIUM CHLORIDE 0.9 % IV BOLUS (SEPSIS)
1000.0000 mL | Freq: Once | INTRAVENOUS | Status: AC
  Administered 2017-03-05: 1000 mL via INTRAVENOUS

## 2017-03-05 MED ORDER — PHENYTOIN SODIUM EXTENDED 100 MG PO CAPS: 300.0000 mg | ORAL_CAPSULE | Freq: Every day | ORAL | 1 refills | Status: DC

## 2017-03-05 MED ORDER — LORAZEPAM 2 MG/ML IJ SOLN
0.5000 mg | Freq: Once | INTRAMUSCULAR | Status: AC
  Administered 2017-03-05: 0.5 mg via INTRAVENOUS
  Filled 2017-03-05: qty 1

## 2017-03-05 MED ORDER — SODIUM CHLORIDE 0.9 % IV SOLN
1000.0000 mg | Freq: Once | INTRAVENOUS | Status: AC
  Administered 2017-03-05: 1000 mg via INTRAVENOUS
  Filled 2017-03-05: qty 20

## 2017-03-05 NOTE — ED Notes (Signed)
This RN called lab to see time estimate on results. Lab tech reports she is "hunting to find the tubes."

## 2017-03-05 NOTE — ED Notes (Signed)
RN will draw from IV line
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
## 2017-03-05 NOTE — ED Notes (Signed)
Bed: WA05 Expected date:  Expected time:  Means of arrival:  Comments: EMS/seizure 

## 2017-03-05 NOTE — ED Triage Notes (Signed)
Per EMS, patient from home, c/o witnessed seizure by family x2. Hx seizure. Patient prescribed dilantin and reports he does not take it as prescribed. Postictal initially. A&Ox4 currently.   18g L hand  BP 134/86 HR 110 O2 98% CBG 96

## 2017-04-25 ENCOUNTER — Encounter (HOSPITAL_COMMUNITY): Payer: Self-pay | Admitting: Emergency Medicine

## 2017-04-25 DIAGNOSIS — Z79899 Other long term (current) drug therapy: Secondary | ICD-10-CM | POA: Insufficient documentation

## 2017-04-25 DIAGNOSIS — L02413 Cutaneous abscess of right upper limb: Secondary | ICD-10-CM | POA: Insufficient documentation

## 2017-04-25 DIAGNOSIS — F17210 Nicotine dependence, cigarettes, uncomplicated: Secondary | ICD-10-CM | POA: Insufficient documentation

## 2017-04-25 LAB — CBC WITH DIFFERENTIAL/PLATELET
Basophils Absolute: 0 10*3/uL (ref 0.0–0.1)
Basophils Relative: 0 %
Eosinophils Absolute: 0.1 10*3/uL (ref 0.0–0.7)
Eosinophils Relative: 0 %
HCT: 36.3 % — ABNORMAL LOW (ref 39.0–52.0)
Hemoglobin: 11.9 g/dL — ABNORMAL LOW (ref 13.0–17.0)
Lymphocytes Relative: 19 %
Lymphs Abs: 2.5 10*3/uL (ref 0.7–4.0)
MCH: 30.8 pg (ref 26.0–34.0)
MCHC: 32.8 g/dL (ref 30.0–36.0)
MCV: 94 fL (ref 78.0–100.0)
Monocytes Absolute: 1.5 10*3/uL — ABNORMAL HIGH (ref 0.1–1.0)
Monocytes Relative: 12 %
Neutro Abs: 9 10*3/uL — ABNORMAL HIGH (ref 1.7–7.7)
Neutrophils Relative %: 69 %
Platelets: 314 10*3/uL (ref 150–400)
RBC: 3.86 MIL/uL — ABNORMAL LOW (ref 4.22–5.81)
RDW: 13.8 % (ref 11.5–15.5)
WBC: 13.1 10*3/uL — ABNORMAL HIGH (ref 4.0–10.5)

## 2017-04-25 LAB — COMPREHENSIVE METABOLIC PANEL
ALT: 33 U/L (ref 17–63)
AST: 54 U/L — ABNORMAL HIGH (ref 15–41)
Albumin: 3.6 g/dL (ref 3.5–5.0)
Alkaline Phosphatase: 156 U/L — ABNORMAL HIGH (ref 38–126)
Anion gap: 12 (ref 5–15)
BUN: <5 mg/dL — ABNORMAL LOW (ref 6–20)
CO2: 21 mmol/L — ABNORMAL LOW (ref 22–32)
Calcium: 9 mg/dL (ref 8.9–10.3)
Chloride: 102 mmol/L (ref 101–111)
Creatinine, Ser: 0.61 mg/dL (ref 0.61–1.24)
GFR calc Af Amer: >60 mL/min (ref >60)
GFR calc non Af Amer: >60 mL/min (ref >60)
Glucose, Bld: 116 mg/dL — ABNORMAL HIGH (ref 65–99)
Potassium: 3.2 mmol/L — ABNORMAL LOW (ref 3.5–5.1)
Sodium: 135 mmol/L (ref 135–145)
Total Bilirubin: 0.5 mg/dL (ref 0.3–1.2)
Total Protein: 7.2 g/dL (ref 6.5–8.1)

## 2017-04-25 LAB — I-STAT CG4 LACTIC ACID, ED: Lactic Acid, Venous: 1.62 mmol/L (ref 0.5–1.9)
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
## 2017-04-26 ENCOUNTER — Emergency Department (HOSPITAL_COMMUNITY)
Admission: EM | Admit: 2017-04-26 | Discharge: 2017-04-26 | Disposition: A | Discharge status: Home / Self Care | Payer: Self-pay | Attending: Emergency Medicine | Admitting: Emergency Medicine

## 2017-04-26 DIAGNOSIS — L0291 Cutaneous abscess, unspecified: Secondary | ICD-10-CM

## 2017-04-26 MED ORDER — CEPHALEXIN 250 MG PO CAPS
500.0000 mg | ORAL_CAPSULE | Freq: Once | ORAL | Status: AC
  Administered 2017-04-26: 500 mg via ORAL
  Filled 2017-04-26: qty 2

## 2017-04-26 MED ORDER — DOXYCYCLINE HYCLATE 100 MG PO TABS
100.0000 mg | ORAL_TABLET | Freq: Once | ORAL | Status: AC
  Administered 2017-04-26: 100 mg via ORAL
  Filled 2017-04-26: qty 1

## 2017-04-26 MED ORDER — CEPHALEXIN 500 MG PO CAPS: 500.0000 mg | ORAL_CAPSULE | Freq: Four times a day (QID) | ORAL | 0 refills | Status: DC

## 2017-04-26 MED ORDER — DOXYCYCLINE HYCLATE 100 MG PO CAPS: 100.0000 mg | ORAL_CAPSULE | Freq: Two times a day (BID) | ORAL | 0 refills | Status: AC

## 2017-04-26 NOTE — Discharge Instructions (Signed)
Please read attached information regarding your condition. Take Keflex and doxycycline as directed. Return in 2-3 days for wound recheck. Return to ED for worsening pain, additional drainage, fevers, numbness in arm.

## 2017-04-26 NOTE — ED Notes (Signed)
Patient Alert and oriented X4. Stable and ambulatory. Patient verbalized understanding of the discharge instructions.  Patient belongings were taken by the patient.  

## 2017-04-26 NOTE — ED Provider Notes (Signed)
[facility] EMERGENCY DEPARTMENT Provider Note   CSN: [id number] Arrival date & time: 04/25/17  1913     History   Chief Complaint Chief Complaint  Patient presents with  . Abscess    HPI Gary Mitchell is a 46 y.o. male with past medical history of seizures, who presents to ED for evaluation of abscess, swelling of right arm and hand for the past days. He is unsure if he was bit by a spider but he states that he was around a lot of spiders before the symptoms began. He denies any other symptoms including no fevers, chills, appetite changes, vomiting, numbness and arm, injuries, prior history of similar symptoms.  HPI  Past Medical History:  Diagnosis Date  . 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 Seizures ([place])     There are no active problems to display for this patient.   Past Surgical History:  Procedure Laterality Date  . left kidney removed         Home Medications    Prior to Admission medications   Medication Sig Start Date End Date Taking? Authorizing Provider  BENZOYL PEROXIDE EX Apply 1 application topically 4 (four) times daily as needed (wound care).   Yes [provider]  phenytoin (DILANTIN) 100 MG ER capsule Take 3 capsules (300 mg total) by mouth daily. Patient taking differently: Take 100 mg by mouth 3 (three) times daily as needed (vertigo prior to onset of seizure).  03/05/17  Yes Julianne Rice, MD  cephALEXin (KEFLEX) 500 MG capsule Take 1 capsule (500 mg total) by mouth 4 (four) times daily. 04/26/17   Lecil Tapp, PA-C  cyclobenzaprine (FLEXERIL) 10 MG tablet Take 1 tablet (10 mg total) by mouth 3 (three) times daily as needed for muscle spasms. Patient not taking: Reported on 03/05/2017 05/22/16   Orpah Greek, MD  doxycycline (VIBRAMYCIN) 100 MG capsule Take 1 capsule (100 mg total) by mouth 2 (two) times daily. 04/26/17 05/03/17  Brisia Schuermann, PA-C  ibuprofen (ADVIL,MOTRIN) 800  MG tablet Take 1 tablet (800 mg total) by mouth 3 (three) times daily. Patient not taking: Reported on 03/05/2017 05/22/16   Orpah Greek, MD  levETIRAcetam (KEPPRA) 500 MG tablet Take 1 tablet (500 mg total) by mouth 2 (two) times daily. Patient not taking: Reported on 05/21/2016 05/18/15   Mesner, Corene Cornea, MD  LORazepam (ATIVAN) 1 MG tablet Take 1 tablet (1 mg total) by mouth 3 (three) times daily as needed for anxiety. Patient not taking: Reported on 05/21/2016 07/03/15   Nat Christen, MD    Family History No family history on file.  Social History Social History  Substance Use Topics  . Smoking status: Current Every Day Smoker    Packs/day: 2.00    Years: 25.00  . Smokeless tobacco: Never Used  . Alcohol use 1.8 oz/week    3 Cans of beer per week     Allergies   Iohexol   Review of Systems Review of Systems  Constitutional: Negative for appetite change, chills and fever.  HENT: Negative for ear pain, rhinorrhea, sneezing and sore throat.   Eyes: Negative for photophobia and visual disturbance.  Respiratory: Negative for cough, chest tightness, shortness of breath and wheezing.   Cardiovascular: Negative for chest pain and palpitations.  Gastrointestinal: Negative for abdominal pain, blood in stool, constipation, diarrhea, nausea and vomiting.  Genitourinary: Negative for dysuria, hematuria and urgency.  Musculoskeletal: Positive for arthralgias. Negative  for myalgias.  Skin: Positive for color change and wound. Negative for rash.  Neurological: Negative for dizziness, weakness and light-headedness.     Physical Exam Updated Vital Signs BP 137/82 (BP Location: Left Arm)   Pulse 99   Resp 15   Ht 5\' 4"  (1.626 m)   Wt 68 kg (150 lb)   SpO2 100%   BMI 25.75 kg/m   Physical Exam  Constitutional: He appears well-developed and well-nourished. No distress.  HENT:  Head: Normocephalic and atraumatic.  Nose: Nose normal.  Eyes: Conjunctivae and EOM are  normal. Left eye exhibits no discharge. No scleral icterus.  Neck: Normal range of motion. Neck supple.  Cardiovascular: Normal rate, regular rhythm, normal heart sounds and intact distal pulses.  Exam reveals no gallop and no friction rub.   No murmur heard. Pulmonary/Chest: Effort normal and breath sounds normal. No respiratory distress.  Abdominal: Soft. Bowel sounds are normal. He exhibits no distension. There is no tenderness. There is no guarding.  Musculoskeletal: Normal range of motion. He exhibits no edema.  Neurological: He is alert. He exhibits normal muscle tone. Coordination normal.  Skin: Skin is warm and dry. No rash noted. There is erythema.  Psychiatric: He has a normal mood and affect.  Nursing note and vitals reviewed.        ED Treatments / Results  Labs (all labs ordered are listed, but only abnormal results are displayed) Labs Reviewed  COMPREHENSIVE METABOLIC PANEL - Abnormal; Notable for the following:       Result Value   Potassium 3.2 (*)    CO2 21 (*)    Glucose, Bld 116 (*)    BUN <5 (*)    AST 54 (*)    Alkaline Phosphatase 156 (*)    All other components within normal limits  CBC WITH DIFFERENTIAL/PLATELET - Abnormal; Notable for the following:    WBC 13.1 (*)    RBC 3.86 (*)    Hemoglobin 11.9 (*)    HCT 36.3 (*)    Neutro Abs 9.0 (*)    Monocytes Absolute 1.5 (*)    All other components within normal limits  I-STAT CG4 LACTIC ACID, ED    EKG  EKG Interpretation None       Radiology No results found.  Procedures Procedures (including critical care time)  Medications Ordered in ED Medications  doxycycline (VIBRA-TABS) tablet 100 mg (not administered)  cephALEXin (KEFLEX) capsule 500 mg (not administered)     Initial Impression / Assessment and Plan / ED Course  I have reviewed the triage vital signs and the nursing notes.  Pertinent labs & imaging results that were available during my care of the patient were reviewed by  me and considered in my medical decision making (see chart for details).     Patient presents to ED for evaluation of abscess to right arm for the past 6 days. Unsure if he was bit by a spider. On physical exam there is any wound present and associated soft tissue swelling noted. Patient is nontoxic-appearing and in no acute distress. He is afebrile with no history of fever. CBC shows a leukocytosis at 13. Lactate normal. Other lab work unremarkable. I suspect that his symptoms are due to a cellulitis and abscess. No drainage noted at this time. We'll discharge with Keflex and doxycycline to be taken in by his wound recheck in 2-3 days or sooner for any severe worsening symptoms.  Final Clinical Impressions(s) / ED Diagnoses   Final diagnoses:  Abscess    New Prescriptions New Prescriptions   CEPHALEXIN (KEFLEX) 500 MG CAPSULE    Take 1 capsule (500 mg total) by mouth 4 (four) times daily.   DOXYCYCLINE (VIBRAMYCIN) 100 MG CAPSULE    Take 1 capsule (100 mg total) by mouth 2 (two) times daily.     Delia Heady, PA-C 04/26/17 1007    Ripley Fraise, MD 04/26/17 [phone]

## 2018-05-26 ENCOUNTER — Encounter (HOSPITAL_COMMUNITY): Payer: Self-pay

## 2018-05-26 ENCOUNTER — Other Ambulatory Visit: Payer: Self-pay

## 2018-05-26 ENCOUNTER — Emergency Department (HOSPITAL_COMMUNITY)
Admission: EM | Admit: 2018-05-26 | Discharge: 2018-05-26 | Disposition: A | Discharge status: Home / Self Care | Payer: Self-pay | Attending: Emergency Medicine | Admitting: Emergency Medicine

## 2018-05-26 DIAGNOSIS — F17200 Nicotine dependence, unspecified, uncomplicated: Secondary | ICD-10-CM | POA: Insufficient documentation

## 2018-05-26 DIAGNOSIS — Z79899 Other long term (current) drug therapy: Secondary | ICD-10-CM | POA: Insufficient documentation

## 2018-05-26 DIAGNOSIS — R569 Unspecified convulsions: Secondary | ICD-10-CM | POA: Insufficient documentation

## 2018-05-26 LAB — COMPREHENSIVE METABOLIC PANEL
ALT: 74 U/L — ABNORMAL HIGH (ref 0–44)
AST: 253 U/L — ABNORMAL HIGH (ref 15–41)
Albumin: 4.4 g/dL (ref 3.5–5.0)
Alkaline Phosphatase: 136 U/L — ABNORMAL HIGH (ref 38–126)
Anion gap: 12 (ref 5–15)
BUN: 7 mg/dL (ref 6–20)
CO2: 24 mmol/L (ref 22–32)
Calcium: 9 mg/dL (ref 8.9–10.3)
Chloride: 98 mmol/L (ref 98–111)
Creatinine, Ser: 0.62 mg/dL (ref 0.61–1.24)
GFR calc Af Amer: >60 mL/min (ref >60)
GFR calc non Af Amer: >60 mL/min (ref >60)
Glucose, Bld: 95 mg/dL (ref 70–99)
Potassium: 3.3 mmol/L — ABNORMAL LOW (ref 3.5–5.1)
Sodium: 134 mmol/L — ABNORMAL LOW (ref 135–145)
Total Bilirubin: 1.3 mg/dL — ABNORMAL HIGH (ref 0.3–1.2)
Total Protein: 8 g/dL (ref 6.5–8.1)

## 2018-05-26 LAB — CBC
HCT: 36.7 % — ABNORMAL LOW (ref 39.0–52.0)
Hemoglobin: 12.2 g/dL — ABNORMAL LOW (ref 13.0–17.0)
MCH: 30.8 pg (ref 26.0–34.0)
MCHC: 33.2 g/dL (ref 30.0–36.0)
MCV: 92.7 fL (ref 80.0–100.0)
Platelets: 160 10*3/uL (ref 150–400)
RBC: 3.96 MIL/uL — ABNORMAL LOW (ref 4.22–5.81)
RDW: 13.9 % (ref 11.5–15.5)
WBC: 9.1 10*3/uL (ref 4.0–10.5)
nRBC: 0 % (ref 0.0–0.2)

## 2018-05-26 MED ORDER — LEVETIRACETAM 500 MG PO TABS: 500.0000 mg | ORAL_TABLET | Freq: Two times a day (BID) | ORAL | 0 refills | Status: DC

## 2018-05-26 MED ORDER — LEVETIRACETAM IN NACL 1000 MG/100ML IV SOLN: 1000.0000 mg | Freq: Once | INTRAVENOUS | Status: DC

## 2018-05-26 MED ORDER — PHENYTOIN SODIUM EXTENDED 100 MG PO CAPS
300.0000 mg | ORAL_CAPSULE | Freq: Once | ORAL | Status: AC
  Administered 2018-05-26: 300 mg via ORAL
  Filled 2018-05-26: qty 3

## 2018-05-26 MED ORDER — PHENYTOIN SODIUM EXTENDED 100 MG PO CAPS: 300.0000 mg | ORAL_CAPSULE | Freq: Every day | ORAL | 1 refills | Status: DC

## 2018-05-26 NOTE — ED Triage Notes (Signed)
EMS reports from work witnessed seizure, upon EMS arrival post ictal. Hx or seizures, non compliant and out meds, unknown last dose. Denies pain. Pt A&O x 4 on arrival.  Pt refused IV, did not want to be transported to ED.  BP 14099 HR 109 Resp 16 Sp02 98 RA CBG 157

## 2018-05-26 NOTE — ED Provider Notes (Signed)
[facility] DEPT Provider Note   CSN: [id number] Arrival date & time: 05/26/18  1011     History   Chief Complaint Chief Complaint  Patient presents with  . Seizures    HPI Gary Mitchell is a 47 y.o. male.  Patient c/o seizure today, witness generalized seizure lasting approximately 1 minute. Pt was briefly postictal but mental status now clear. Pt notes hx seizures. Pt unsure how long ago last seizure was. States he is supposed to be taking dilantin, but hasnt taken for months. Denies injury or pain. No headache. No neck or back pain. No chest pain or abd pain. Skin intact. Hx etoh abuse - pt states he does not drink heavily or daily. Denies drug use. No fever or chills. Normal appetite. States recent health at baseline.   The history is provided by the patient.  Seizures   Pertinent negatives include no confusion, no headaches, no visual disturbance, no sore throat, no chest pain and no cough.    Past Medical History:  Diagnosis Date  . 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 Seizures ([place])     There are no active problems to display for this patient.   Past Surgical History:  Procedure Laterality Date  . left kidney removed          Home Medications    Prior to Admission medications   Medication Sig Start Date End Date Taking? Authorizing Provider  BENZOYL PEROXIDE EX Apply 1 application topically 4 (four) times daily as needed (wound care).    [provider]  cephALEXin (KEFLEX) 500 MG capsule Take 1 capsule (500 mg total) by mouth 4 (four) times daily. 04/26/17   Khatri, Hina, PA-C  cyclobenzaprine (FLEXERIL) 10 MG tablet Take 1 tablet (10 mg total) by mouth 3 (three) times daily as needed for muscle spasms. Patient not taking: Reported on 03/05/2017 05/22/16   Orpah Greek, MD  ibuprofen (ADVIL,MOTRIN) 800 MG tablet Take 1 tablet (800 mg total) by mouth 3 (three) times daily. Patient  not taking: Reported on 03/05/2017 05/22/16   Orpah Greek, MD  levETIRAcetam (KEPPRA) 500 MG tablet Take 1 tablet (500 mg total) by mouth 2 (two) times daily. Patient not taking: Reported on 05/21/2016 05/18/15   Mesner, Corene Cornea, MD  LORazepam (ATIVAN) 1 MG tablet Take 1 tablet (1 mg total) by mouth 3 (three) times daily as needed for anxiety. Patient not taking: Reported on 05/21/2016 07/03/15   Nat Christen, MD  phenytoin (DILANTIN) 100 MG ER capsule Take 3 capsules (300 mg total) by mouth daily. Patient taking differently: Take 100 mg by mouth 3 (three) times daily as needed (vertigo prior to onset of seizure).  03/05/17   Julianne Rice, MD    Family History History reviewed. No pertinent family history.  Social History Social History   Tobacco Use  . Smoking status: Current Every Day Smoker    Packs/day: 2.00    Years: 25.00    Pack years: 50.00  . Smokeless tobacco: Never Used  Substance Use Topics  . Alcohol use: Yes    Alcohol/week: 3.0 standard drinks    Types: 3 Cans of beer per week  . Drug use: Yes    Types: Marijuana     Allergies   Iohexol   Review of Systems Review of Systems  Constitutional: Negative for fever.  HENT: Negative for sore throat.   Eyes: Negative for visual disturbance.  Respiratory:  Negative for cough and shortness of breath.   Cardiovascular: Negative for chest pain.  Gastrointestinal: Negative for abdominal pain.  Genitourinary: Negative for dysuria and flank pain.  Musculoskeletal: Negative for back pain and neck pain.  Skin: Negative for rash.  Neurological: Positive for seizures. Negative for weakness, numbness and headaches.  Hematological: Does not bruise/bleed easily.  Psychiatric/Behavioral: Negative for confusion.     Physical Exam Updated Vital Signs BP (!) 142/95   Pulse (!) 102   Temp 98.1 F (36.7 C) (Oral)   Resp 16   SpO2 97%   Physical Exam  Constitutional: He appears well-developed and  well-nourished.  HENT:  Head: Atraumatic.  Mouth/Throat: Oropharynx is clear and moist.  Minimal contusion to lateral edge tongue, no laceration.   Eyes: Conjunctivae are normal.  Neck: Neck supple. No tracheal deviation present.  Cardiovascular: Normal rate, regular rhythm, normal heart sounds and intact distal pulses. Exam reveals no gallop and no friction rub.  No murmur heard. Pulmonary/Chest: Effort normal and breath sounds normal. No accessory muscle usage. No respiratory distress. He exhibits no tenderness.  Abdominal: Soft. Bowel sounds are normal. He exhibits no distension. There is no tenderness.  Genitourinary:  Genitourinary Comments: No cva tenderness.   Musculoskeletal: He exhibits no edema.  Neurological: He is alert.  Speech clear/fluent. Motor intact bil, stre 5/5. sens grossly intact. No tremor or shakes. Steady gait.   Skin: Skin is warm and dry. No rash noted.  Psychiatric: He has a normal mood and affect.  Nursing note and vitals reviewed.    ED Treatments / Results  Labs (all labs ordered are listed, but only abnormal results are displayed) Labs Reviewed  CBC  COMPREHENSIVE METABOLIC PANEL    EKG None  Radiology No results found.  Procedures Procedures (including critical care time)  Medications Ordered in ED Medications  phenytoin (DILANTIN) ER capsule 300 mg (has no administration in time range)     Initial Impression / Assessment and Plan / ED Course  I have reviewed the triage vital signs and the nursing notes.  Pertinent labs & imaging results that were available during my care of the patient were reviewed by me and considered in my medical decision making (see chart for details).  Labs ordered. IV. Continuous pulse ox and monitor.   Reviewed nursing notes and prior charts for additional history.   Pt refuses iv meds. Dilantin po given.   RN indicates pt initially refused lab draw, but is now is agreeable.   1357, labs pending.    Pt with seizure in ED, generalized tonic clonic, lasted 60 seconds. Postictal. keppra 100 mg iv.   SIgned out to Dr Wilson Singer that labs pending. Pt will need reassessment and labs checked when resulted. Disposition per Dr Wilson Singer.   CRITICAL CARE  RE: recurrent seizures, requiring IV therapy/seizure medication.  Performed by: Mirna Mires Total critical care time: 35 minutes Critical care time was exclusive of separately billable procedures and treating other patients. Critical care was necessary to treat or prevent imminent or life-threatening deterioration. Critical care was time spent personally by me on the following activities: development of treatment plan with patient and/or surrogate as well as nursing, discussions with consultants, evaluation of patient's response to treatment, examination of patient, obtaining history from patient or surrogate, ordering and performing treatments and interventions, ordering and review of laboratory studies, ordering and review of radiographic studies, pulse oximetry and re-evaluation of patient's condition.   Final Clinical Impressions(s) / ED Diagnoses  Final diagnoses:  None    ED Discharge Orders    None       Lajean Saver, MD 05/26/18 1600

## 2018-05-26 NOTE — ED Provider Notes (Signed)
Assumed care at change of shift.  Patient here for seizures.  Apparently had another seizure while in the emergency room.  He is requesting to leave.  I went and assessed him.  He is alert has no complaints currently.  He is following commands and acting appropriately.  He has repeatedly refused IV placement.  He did get a dose of oral Dilantin.  He does not want to wait for oral Keppra.  He will be discrete enlarged with new prescriptions for both his Dilantin and Keppra.  Return precautions discussed.  Outpatient follow-up otherwise.   Virgel Manifold, MD 05/26/18 [phone]

## 2018-05-26 NOTE — ED Notes (Addendum)
Pt refused IV again, asked several times and explained need for IV medication but still refused. Stated "I do not want an IV" several times.

## 2018-05-26 NOTE — ED Notes (Signed)
Bed: WA21 Expected date:  Expected time:  Means of arrival:  Comments: EMS-SZ

## 2018-05-26 NOTE — ED Notes (Signed)
Post lab draw Pt had seizure lasting approx. 60 seconds. No injury sustained, Pt currently in Post Ictal state. Will re-assess in 30 minutes.

## 2018-05-26 NOTE — ED Notes (Signed)
Pt refuses IV/blood work

## 2019-11-06 DIAGNOSIS — K7010 Alcoholic hepatitis without ascites: Secondary | ICD-10-CM

## 2019-11-06 DIAGNOSIS — K859 Acute pancreatitis without necrosis or infection, unspecified: Secondary | ICD-10-CM

## 2019-11-06 DIAGNOSIS — K7030 Alcoholic cirrhosis of liver without ascites: Secondary | ICD-10-CM

## 2019-11-06 DIAGNOSIS — K869 Disease of pancreas, unspecified: Secondary | ICD-10-CM

## 2019-11-06 HISTORY — DX: Disease of pancreas, unspecified: K86.9

## 2019-11-06 HISTORY — DX: Alcoholic cirrhosis of liver without ascites: K70.30

## 2019-11-06 HISTORY — DX: Alcoholic hepatitis without ascites: K70.10

## 2019-11-06 HISTORY — DX: Acute pancreatitis without necrosis or infection, unspecified: K85.90

## 2019-11-23 ENCOUNTER — Inpatient Hospital Stay (HOSPITAL_COMMUNITY)
Admission: EM | Admit: 2019-11-23 | Discharge: 2019-11-29 | DRG: 439 | Disposition: A | Discharge status: Home / Self Care | Payer: Self-pay | Attending: Internal Medicine | Admitting: Internal Medicine

## 2019-11-23 ENCOUNTER — Other Ambulatory Visit: Payer: Self-pay

## 2019-11-23 ENCOUNTER — Inpatient Hospital Stay (HOSPITAL_COMMUNITY): Payer: Self-pay

## 2019-11-23 ENCOUNTER — Emergency Department (HOSPITAL_COMMUNITY): Payer: Self-pay

## 2019-11-23 ENCOUNTER — Encounter (HOSPITAL_COMMUNITY): Payer: Self-pay | Admitting: Emergency Medicine

## 2019-11-23 DIAGNOSIS — Z20822 Contact with and (suspected) exposure to covid-19: Secondary | ICD-10-CM | POA: Diagnosis present

## 2019-11-23 DIAGNOSIS — G40909 Epilepsy, unspecified, not intractable, without status epilepticus: Secondary | ICD-10-CM | POA: Diagnosis present

## 2019-11-23 DIAGNOSIS — Z9114 Patient's other noncompliance with medication regimen: Secondary | ICD-10-CM

## 2019-11-23 DIAGNOSIS — F17210 Nicotine dependence, cigarettes, uncomplicated: Secondary | ICD-10-CM | POA: Diagnosis present

## 2019-11-23 DIAGNOSIS — F1020 Alcohol dependence, uncomplicated: Secondary | ICD-10-CM | POA: Diagnosis present

## 2019-11-23 DIAGNOSIS — K7010 Alcoholic hepatitis without ascites: Secondary | ICD-10-CM | POA: Diagnosis present

## 2019-11-23 DIAGNOSIS — R17 Unspecified jaundice: Secondary | ICD-10-CM

## 2019-11-23 DIAGNOSIS — K766 Portal hypertension: Secondary | ICD-10-CM | POA: Diagnosis present

## 2019-11-23 DIAGNOSIS — K8020 Calculus of gallbladder without cholecystitis without obstruction: Secondary | ICD-10-CM | POA: Diagnosis present

## 2019-11-23 DIAGNOSIS — K8689 Other specified diseases of pancreas: Secondary | ICD-10-CM | POA: Diagnosis present

## 2019-11-23 DIAGNOSIS — K7030 Alcoholic cirrhosis of liver without ascites: Secondary | ICD-10-CM | POA: Diagnosis present

## 2019-11-23 DIAGNOSIS — E46 Unspecified protein-calorie malnutrition: Secondary | ICD-10-CM | POA: Diagnosis present

## 2019-11-23 DIAGNOSIS — Z6823 Body mass index (BMI) 23.0-23.9, adult: Secondary | ICD-10-CM

## 2019-11-23 DIAGNOSIS — E876 Hypokalemia: Secondary | ICD-10-CM | POA: Diagnosis present

## 2019-11-23 DIAGNOSIS — K852 Alcohol induced acute pancreatitis without necrosis or infection: Principal | ICD-10-CM | POA: Diagnosis present

## 2019-11-23 DIAGNOSIS — F1010 Alcohol abuse, uncomplicated: Secondary | ICD-10-CM | POA: Diagnosis present

## 2019-11-23 DIAGNOSIS — R109 Unspecified abdominal pain: Secondary | ICD-10-CM

## 2019-11-23 DIAGNOSIS — K859 Acute pancreatitis without necrosis or infection, unspecified: Secondary | ICD-10-CM | POA: Diagnosis present

## 2019-11-23 DIAGNOSIS — E871 Hypo-osmolality and hyponatremia: Secondary | ICD-10-CM | POA: Diagnosis present

## 2019-11-23 DIAGNOSIS — D649 Anemia, unspecified: Secondary | ICD-10-CM | POA: Diagnosis present

## 2019-11-23 DIAGNOSIS — K860 Alcohol-induced chronic pancreatitis: Secondary | ICD-10-CM | POA: Diagnosis present

## 2019-11-23 DIAGNOSIS — K219 Gastro-esophageal reflux disease without esophagitis: Secondary | ICD-10-CM | POA: Diagnosis present

## 2019-11-23 DIAGNOSIS — Z905 Acquired absence of kidney: Secondary | ICD-10-CM

## 2019-11-23 LAB — CBC
HCT: 31.7 % — ABNORMAL LOW (ref 39.0–52.0)
Hemoglobin: 11.1 g/dL — ABNORMAL LOW (ref 13.0–17.0)
MCH: 32.4 pg (ref 26.0–34.0)
MCHC: 35 g/dL (ref 30.0–36.0)
MCV: 92.4 fL (ref 80.0–100.0)
Platelets: 190 10*3/uL (ref 150–400)
RBC: 3.43 MIL/uL — ABNORMAL LOW (ref 4.22–5.81)
RDW: 18.8 % — ABNORMAL HIGH (ref 11.5–15.5)
WBC: 17.9 10*3/uL — ABNORMAL HIGH (ref 4.0–10.5)
nRBC: 0 % (ref 0.0–0.2)

## 2019-11-23 LAB — GLUCOSE, CAPILLARY: Glucose-Capillary: 77 mg/dL (ref 70–99)

## 2019-11-23 LAB — COMPREHENSIVE METABOLIC PANEL
ALT: 35 U/L (ref 0–44)
AST: 132 U/L — ABNORMAL HIGH (ref 15–41)
Albumin: 2.8 g/dL — ABNORMAL LOW (ref 3.5–5.0)
Alkaline Phosphatase: 190 U/L — ABNORMAL HIGH (ref 38–126)
Anion gap: 22 — ABNORMAL HIGH (ref 5–15)
BUN: 8 mg/dL (ref 6–20)
CO2: 17 mmol/L — ABNORMAL LOW (ref 22–32)
Calcium: 9 mg/dL (ref 8.9–10.3)
Chloride: 89 mmol/L — ABNORMAL LOW (ref 98–111)
Creatinine, Ser: 0.86 mg/dL (ref 0.61–1.24)
GFR calc Af Amer: >60 mL/min (ref >60)
GFR calc non Af Amer: >60 mL/min (ref >60)
Glucose, Bld: 108 mg/dL — ABNORMAL HIGH (ref 70–99)
Potassium: 2.6 mmol/L — CL (ref 3.5–5.1)
Sodium: 128 mmol/L — ABNORMAL LOW (ref 135–145)
Total Bilirubin: 34.8 mg/dL (ref 0.3–1.2)
Total Protein: 8.8 g/dL — ABNORMAL HIGH (ref 6.5–8.1)

## 2019-11-23 LAB — URINALYSIS, ROUTINE W REFLEX MICROSCOPIC
Glucose, UA: 50 mg/dL — AB
Hgb urine dipstick: NEGATIVE
Ketones, ur: 20 mg/dL — AB
Leukocytes,Ua: NEGATIVE
Nitrite: NEGATIVE
Protein, ur: 100 mg/dL — AB
Specific Gravity, Urine: 1.025 (ref 1.005–1.030)
pH: 5 (ref 5.0–8.0)

## 2019-11-23 LAB — LIPASE, BLOOD: Lipase: 284 U/L — ABNORMAL HIGH (ref 11–51)

## 2019-11-23 MED ORDER — PIPERACILLIN-TAZOBACTAM 3.375 G IVPB 30 MIN
3.3750 g | Freq: Once | INTRAVENOUS | Status: AC
  Administered 2019-11-23: 3.375 g via INTRAVENOUS
  Filled 2019-11-23: qty 50

## 2019-11-23 MED ORDER — ADULT MULTIVITAMIN W/MINERALS CH
1.0000 | ORAL_TABLET | Freq: Every day | ORAL | Status: DC
  Administered 2019-11-24 – 2019-11-29 (×6): 1 via ORAL
  Filled 2019-11-23 (×6): qty 1

## 2019-11-23 MED ORDER — MORPHINE SULFATE (PF) 4 MG/ML IV SOLN
4.0000 mg | Freq: Once | INTRAVENOUS | Status: AC
  Administered 2019-11-23: 4 mg via INTRAVENOUS
  Filled 2019-11-23: qty 1

## 2019-11-23 MED ORDER — ONDANSETRON HCL 4 MG/2ML IJ SOLN
4.0000 mg | Freq: Once | INTRAMUSCULAR | Status: AC
  Administered 2019-11-23: 4 mg via INTRAVENOUS
  Filled 2019-11-23: qty 2

## 2019-11-23 MED ORDER — LORAZEPAM 2 MG/ML IJ SOLN: 1.0000 mg | INTRAMUSCULAR | Status: AC | PRN

## 2019-11-23 MED ORDER — THIAMINE HCL 100 MG/ML IJ SOLN: 100.0000 mg | Freq: Every day | INTRAMUSCULAR | Status: DC

## 2019-11-23 MED ORDER — ONDANSETRON HCL 4 MG/2ML IJ SOLN: 4.0000 mg | Freq: Four times a day (QID) | INTRAMUSCULAR | Status: DC | PRN

## 2019-11-23 MED ORDER — SODIUM CHLORIDE 0.9 % IV SOLN
2.0000 g | Freq: Once | INTRAVENOUS | Status: AC
  Administered 2019-11-23: 2 g via INTRAVENOUS
  Filled 2019-11-23: qty 20

## 2019-11-23 MED ORDER — GADOBUTROL 1 MMOL/ML IV SOLN
7.0000 mL | Freq: Once | INTRAVENOUS | Status: AC | PRN
  Administered 2019-11-23: 7 mL via INTRAVENOUS

## 2019-11-23 MED ORDER — LACTATED RINGERS IV SOLN: INTRAVENOUS | Status: AC

## 2019-11-23 MED ORDER — FENTANYL CITRATE (PF) 100 MCG/2ML IJ SOLN
50.0000 ug | Freq: Once | INTRAMUSCULAR | Status: AC
  Administered 2019-11-23: 50 ug via INTRAVENOUS
  Filled 2019-11-23: qty 2

## 2019-11-23 MED ORDER — PIPERACILLIN-TAZOBACTAM 3.375 G IVPB 30 MIN
3.3750 g | Freq: Three times a day (TID) | INTRAVENOUS | Status: DC
  Administered 2019-11-24 – 2019-11-25 (×4): 3.375 g via INTRAVENOUS
  Filled 2019-11-23 (×11): qty 50

## 2019-11-23 MED ORDER — SODIUM CHLORIDE 0.9 % IV BOLUS
1000.0000 mL | Freq: Once | INTRAVENOUS | Status: AC
  Administered 2019-11-23: 1000 mL via INTRAVENOUS

## 2019-11-23 MED ORDER — THIAMINE HCL 100 MG PO TABS
100.0000 mg | ORAL_TABLET | Freq: Every day | ORAL | Status: DC
  Administered 2019-11-24 – 2019-11-29 (×6): 100 mg via ORAL
  Filled 2019-11-23 (×6): qty 1

## 2019-11-23 MED ORDER — ONDANSETRON HCL 4 MG PO TABS: 4.0000 mg | ORAL_TABLET | Freq: Four times a day (QID) | ORAL | Status: DC | PRN

## 2019-11-23 MED ORDER — FOLIC ACID 1 MG PO TABS
1.0000 mg | ORAL_TABLET | Freq: Every day | ORAL | Status: DC
  Administered 2019-11-24 – 2019-11-29 (×6): 1 mg via ORAL
  Filled 2019-11-23 (×7): qty 1

## 2019-11-23 MED ORDER — LORAZEPAM 2 MG/ML IJ SOLN
0.0000 mg | Freq: Four times a day (QID) | INTRAMUSCULAR | Status: DC
  Administered 2019-11-23 – 2019-11-25 (×2): 1 mg via INTRAVENOUS
  Filled 2019-11-23 (×2): qty 1

## 2019-11-23 MED ORDER — LORAZEPAM 1 MG PO TABS: 1.0000 mg | ORAL_TABLET | ORAL | Status: AC | PRN

## 2019-11-23 MED ORDER — LORAZEPAM 2 MG/ML IJ SOLN: 0.0000 mg | Freq: Two times a day (BID) | INTRAMUSCULAR | Status: DC

## 2019-11-23 NOTE — ED Triage Notes (Signed)
Pt reports abd pain with N/V and hematuria for 2 months.

## 2019-11-23 NOTE — ED Notes (Signed)
Patient transported to Ultrasound 

## 2019-11-23 NOTE — H&P (Signed)
History and Physical    Gary Mitchell [id number] DOB: [birth year] DOA: 11/23/2019  PCP: Patient, No Pcp Per  Patient coming from: Home.  Chief Complaint: Abdominal pain.  HPI: Gary Mitchell is a 49 y.o. male with history of alcoholism and alcohol-related seizures presently not take any medications presents to the ER with complaint of having abdominal pain with nausea vomiting over the last 5 days.  Denies any diarrhea denies any fever chills or shortness of breath.  Pain has been constant increased on eating.  Denies any blood in the vomitus.  Patient admits to drinking alcohol every day.  ED Course: In the ER patient was afebrile and not hypoxic.  Labs show lipase of 284 AST 132 ALT 35 total bilirubin 34.8 creatinine 0.8.  Potassium was 2.6 sodium 128 WBC 70.9 hemoglobin 9.1.  Sonogram of the abdomen shows gallstones with thickening of the gallbladder wall.  Also features of cirrhosis.  CT abdomen pelvis shows focal pancreatitis with concerning features for portal hypertension.  In the ER patient had discussed with on-call gastroenterologist and general surgery.  Patient started on antibiotics admitted for further management MRCP has been ordered results of which are pending.  Covid test is negative.  Review of Systems: As per HPI, rest all negative.   Past Medical History:  Diagnosis Date  . 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 Seizures ([place])     Past Surgical History:  Procedure Laterality Date  . left kidney removed       reports that he has been smoking. He has a 50.00 pack-year smoking history. He has never used smokeless tobacco. He reports current alcohol use of about 3.0 standard drinks of alcohol per week. He reports current drug use. Drug: Marijuana.  Allergies  Allergen Reactions  . Iohexol Other (See Comments)    Unknown reaction at 98 days old    Family History  Problem Relation Age of Onset  . Diabetes Mellitus II Mother      Prior to Admission medications   Medication Sig Start Date End Date Taking? Authorizing Provider  ibuprofen (ADVIL,MOTRIN) 200 MG tablet Take 400-600 mg by mouth every 6 (six) hours as needed for moderate pain.   Yes [provider]  levETIRAcetam (KEPPRA) 500 MG tablet Take 1 tablet (500 mg total) by mouth 2 (two) times daily. 05/26/18   Virgel Manifold, MD  LORazepam (ATIVAN) 1 MG tablet Take 1 tablet (1 mg total) by mouth 3 (three) times daily as needed for anxiety. Patient not taking: Reported on 05/26/2018 07/03/15   Nat Christen, MD  phenytoin (DILANTIN) 100 MG ER capsule Take 3 capsules (300 mg total) by mouth daily. 05/26/18   Virgel Manifold, MD    Physical Exam: Constitutional: Moderately built and nourished. Vitals:   11/23/19 1600 11/23/19 1630 11/23/19 1800 11/23/19 2230  BP: (!) 138/95 (!) 153/93 (!) 146/89 124/72  Pulse:    96  Resp: 17 17 19 20   Temp:      TempSrc:      SpO2:  99%  96%   Eyes: Icterus present no pallor. ENMT: No discharge from the ears eyes nose or mouth. Neck: No mass felt.  No neck rigidity. Respiratory: No rhonchi or crepitations. Cardiovascular: S1-S2 heard. Abdomen: Soft epigastric tenderness no guarding or rigidity. Musculoskeletal: No edema. Skin: No rash. Neurologic: Alert awake oriented to time place and person.  Moves all extremities. Psychiatric: Appears normal per normal affect.  Labs on Admission: I have personally reviewed following labs and imaging studies  CBC: Recent Labs  Lab 11/23/19 1354  WBC 17.9*  HGB 11.1*  HCT 31.7*  MCV 92.4  PLT 99991111   Basic Metabolic Panel: Recent Labs  Lab 11/23/19 1354  NA 128*  K 2.6*  CL 89*  CO2 17*  GLUCOSE 108*  BUN 8  CREATININE 0.86  CALCIUM 9.0   GFR: CrCl cannot be calculated (Unknown ideal weight.). Liver Function Tests: Recent Labs  Lab 11/23/19 1354  AST 132*  ALT 35  ALKPHOS 190*  BILITOT 34.8*  PROT 8.8*  ALBUMIN 2.8*   Recent Labs  Lab  11/23/19 1354  LIPASE 284*   No results for input(s): AMMONIA in the last 168 hours. Coagulation Profile: No results for input(s): INR, PROTIME in the last 168 hours. Cardiac Enzymes: No results for input(s): CKTOTAL, CKMB, CKMBINDEX, TROPONINI in the last 168 hours. BNP (last 3 results) No results for input(s): PROBNP in the last 8760 hours. HbA1C: No results for input(s): HGBA1C in the last 72 hours. CBG: No results for input(s): GLUCAP in the last 168 hours. Lipid Profile: No results for input(s): CHOL, HDL, LDLCALC, TRIG, CHOLHDL, LDLDIRECT in the last 72 hours. Thyroid Function Tests: No results for input(s): TSH, T4TOTAL, FREET4, T3FREE, THYROIDAB in the last 72 hours. Anemia Panel: No results for input(s): VITAMINB12, FOLATE, FERRITIN, TIBC, IRON, RETICCTPCT in the last 72 hours. Urine analysis:    Component Value Date/Time   COLORURINE AMBER (A) 11/23/2019 1546   APPEARANCEUR HAZY (A) 11/23/2019 1546   LABSPEC 1.025 11/23/2019 1546   PHURINE 5.0 11/23/2019 1546   GLUCOSEU 50 (A) 11/23/2019 1546   HGBUR NEGATIVE 11/23/2019 1546   BILIRUBINUR MODERATE (A) 11/23/2019 1546   KETONESUR 20 (A) 11/23/2019 1546   PROTEINUR 100 (A) 11/23/2019 1546   NITRITE NEGATIVE 11/23/2019 1546   LEUKOCYTESUR NEGATIVE 11/23/2019 1546   Sepsis Labs: @LABRCNTIP (procalcitonin:4,lacticidven:4) )No results found for this or any previous visit (from the past 240 hour(s)).   Radiological Exams on Admission: CT ABDOMEN PELVIS WO CONTRAST  Result Date: 11/23/2019 CLINICAL DATA:  Abdominal pain and nausea and vomiting for 2 months. Hematuria. EXAM: CT ABDOMEN AND PELVIS WITHOUT CONTRAST TECHNIQUE: Multidetector CT imaging of the abdomen and pelvis was performed following the standard protocol without IV contrast. COMPARISON:  None. FINDINGS: Lower chest: No acute findings. Hepatobiliary: Mild hepatomegaly and moderate diffuse hepatic steatosis are noted. No mass visualized on this unenhanced exam.  Recanalization of paraumbilical veins is seen and suspicious for portal venous hypertension. Gallbladder is unremarkable. No evidence of biliary ductal dilatation. Pancreas: Enlargement of the pancreatic head is seen with peripancreatic soft tissue swelling, consistent with acute pancreatitis. No peripancreatic fluid collections are seen. Spleen:  Within normal limits in size. Adrenals/Urinary tract: Normal adrenal glands. Absent left kidney. No evidence of right renal or ureteral calculi, or hydronephrosis. Unremarkable unopacified urinary bladder. Stomach/Bowel: No evidence of obstruction, inflammatory process, or abnormal fluid collections. Vascular/Lymphatic: No pathologically enlarged lymph nodes identified. No evidence of abdominal aortic aneurysm. Aortic atherosclerosis noted. Reproductive:  No mass or other significant abnormality. Other:  None. Musculoskeletal:  No suspicious bone lesions identified. IMPRESSION: 1. Acute focal pancreatitis involving the pancreatic head. Recommend continued follow-up by contrast enhanced CT to confirm resolution and exclude underlying neoplasm. 2. Hepatic steatosis and hepatomegaly. 3. Recanalization of paraumbilical veins is suspicious for portal venous hypertension due to underlying cirrhosis. 4. Absent left kidney. No evidence of right renal calculi or hydronephrosis. Aortic Atherosclerosis (  ICD10-I70.0). Electronically Signed   By: Marlaine Hind M.D.   On: 11/23/2019 18:53   DG Chest 2 View  Result Date: 11/23/2019 CLINICAL DATA:  Preop EXAM: CHEST - 2 VIEW COMPARISON:  05/21/2016 FINDINGS: The heart size and mediastinal contours are within normal limits. Both lungs are clear. The visualized skeletal structures are unremarkable. IMPRESSION: No active cardiopulmonary disease. Electronically Signed   By: Rolm Baptise M.D.   On: 11/23/2019 20:25   US Abdomen Limited RUQ  Result Date: 11/23/2019 CLINICAL DATA:  Abdominal pain EXAM: ULTRASOUND ABDOMEN LIMITED RIGHT  UPPER QUADRANT COMPARISON:  None. FINDINGS: Gallbladder: Multiple layering stones and sludge within the gallbladder. Gallbladder wall is thickened at 4 mm. Small amount of pericholecystic fluid. Common bile duct: Diameter: 5 mm in diameter Liver: Heterogeneous, increased echotexture with nodular contours suggesting cirrhosis. Portal vein is patent on color Doppler imaging with normal direction of blood flow towards the liver. Recanalized umbilical vein. Other: None. IMPRESSION: Increased echotexture throughout the liver with nodular contour suggesting cirrhosis. Recanalized umbilical vein compatible with portal venous hypertension. Stones and sludge noted within gallbladder with gallbladder wall thickening and a small amount of pericholecystic fluid. Cannot exclude acute cholecystitis. Electronically Signed   By: Rolm Baptise M.D.   On: 11/23/2019 18:01    EKG: Independently reviewed.  Normal sinus rhythm with early repolarization changes.  Assessment/Plan Principal Problem:   Acute pancreatitis Active Problems:   Alcoholic hepatitis   Gallstones   Alcohol abuse    1. Acute pancreatitis cause of which could be either gallstones or alcoholism.  MRCP has been ordered which is pending.  Patient be kept n.p.o. fluids and pain medications. 2. Marked elevated total bilirubin likely from alcoholic hepatitis.  Gastroenterology has been notified follow LFTs.  Follow MRCP. 3. Gallstones with features concerning for cholecystitis for which general surgery has been consulted patient is placed on empiric antibiotics. 4. Alcohol abuse advised about quitting patient on CIWA protocol. 5. Anemia follow CBC. 6. History of alcohol-related seizures has not taken any medication for last few months.  Closely monitor.  Presently on CIWA protocol.  Since patient has acute pancreatitis with markedly elevated LFTs will need close monitoring for any further deterioration in inpatient status.   DVT prophylaxis: We will  keep patient SCDs since patient has abnormal LFTs will avoid anticoagulation.  May need procedure. Code Status: Full code. Family Communication: Discussed with patient. Disposition Plan: To be determined. Consults called: Gastroenterology and general surgery. Admission status: Inpatient.   Rise Patience MD [facility] Pager [phone].  If 7PM-7AM, please contact night-coverage [email or web address] Password [facility]  11/23/2019, 10:36 PM

## 2019-11-23 NOTE — ED Notes (Signed)
Pt returned from MRI °

## 2019-11-23 NOTE — Progress Notes (Signed)
[facility]iology called and let us know that the Body Radiologist will not be in till tomorrow (5/19) at 8am to read his MRI imaging.

## 2019-11-23 NOTE — ED Provider Notes (Signed)
[facility] Provider Note   CSN: [id number] Arrival date & time: 11/23/19  1329     History Chief Complaint  Patient presents with  . Abdominal Pain  . Hematuria    Gary Mitchell is a 49 y.o. 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  He states he has not seen anybody for the hematuria.  He states he came in today because he has not been able to tolerate any p.o. and he has worsening abdominal pain.  He states that he has not had anything over the last 4 to 5 days.  He states that vomit is nonbloody but he has noticed some occasional episodes of green.  No coffee-ground emesis.  His last bowel movement was about 2 to 3 days ago.  He is still passing flatus.  He has not noted any fevers, weight loss.  He reports that he has not seen anybody in the 2 months that this hematuria has been occurring.  He states it does not happen every time and he has not noted any passing of clots.  He has no dysuria, testicular pain or swelling, penile pain or swelling.  He states that his abdominal pain is generalized with no focal point.  He states that he feels like sometimes his abdomen is swollen and is pushing up against his chest causing pain all over.  He states he also has some burning reflux pain occasionally.  He currently denies any chest pain.  He also reports feeling lightheaded and generalized weakness whenever he is moving around.  No fevers, dysuria.  He states he quit smoking about 4 months ago.  He does endorse daily alcohol use and states he drinks about 40 ounces of beer a day.  He states that he does not think he has gone into seizures from withdrawal.  He does report a history of seizures but he thinks it is due to seizure disorder  rather than alcohol use.  The history is provided by the patient.       Past Medical History:  Diagnosis Date  . 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 Seizures [facility])     Patient Active Problem List   Diagnosis Date Noted  . Acute pancreatitis 11/23/2019  . Alcoholic hepatitis 56/31/4970  . Gallstones 11/23/2019  . Alcohol abuse 11/23/2019    Past Surgical History:  Procedure Laterality Date  . left kidney removed         Family History  Problem Relation Age of Onset  . Diabetes Mellitus II Mother     Social History   Tobacco Use  . Smoking status: Current Every Day Smoker    Packs/day: 2.00    Years: 25.00    Pack years: 50.00  . Smokeless tobacco: Never Used  Substance Use Topics  . Alcohol use: Yes    Alcohol/week: 3.0 standard drinks    Types: 3 Cans of beer per week  . Drug use: Yes    Types: Marijuana    Home Medications Prior to Admission medications   Medication Sig Start Date End Date Taking? Authorizing Provider  ibuprofen (ADVIL,MOTRIN) 200 MG tablet Take 400-600 mg by mouth every 6 (six) hours as needed for moderate pain.   Yes [provider]  levETIRAcetam (KEPPRA) 500 MG tablet Take 1 tablet (500 mg total) by mouth 2 (two) times daily. 05/26/18   Virgel Manifold, MD  LORazepam (ATIVAN) 1 MG tablet Take 1 tablet (1 mg total) by mouth 3 (three) times daily as needed for anxiety. Patient not taking: Reported on 05/26/2018 07/03/15   Nat Christen, MD  phenytoin (DILANTIN) 100 MG ER capsule Take 3 capsules (300 mg total) by mouth daily. 05/26/18   Virgel Manifold, MD    Allergies    Iohexol  Review of Systems   Review of Systems  Constitutional: Positive for appetite change. Negative for fever.  Respiratory: Negative for cough and shortness of breath.   Cardiovascular: Negative for chest pain.  Gastrointestinal: Positive for abdominal pain, nausea and vomiting.  Genitourinary: Positive for hematuria. Negative  for dysuria.  Neurological: Positive for weakness (generalized). Negative for headaches.  All other systems reviewed and are negative.   Physical Exam Updated Vital Signs BP 132/80   Pulse (!) 101   Temp 98.7 F (37.1 C) (Oral)   Resp (!) 26   SpO2 97%   Physical Exam Vitals and nursing note reviewed.  Constitutional:      Appearance: Normal appearance. He is well-developed.  HENT:     Head: Normocephalic and atraumatic.  Eyes:     General: Lids are normal. Scleral icterus present.     Conjunctiva/sclera: Conjunctivae normal.     Pupils: Pupils are equal, round, and reactive to light.     Comments: Scleral icterus noted bilaterally.  Cardiovascular:     Rate and Rhythm: Normal rate and regular rhythm.     Pulses: Normal pulses.     Heart sounds: Normal heart sounds. No murmur. No friction rub. No gallop.   Pulmonary:     Effort: Pulmonary effort is normal.     Breath sounds: Normal breath sounds.     Comments: Lungs clear to auscultation bilaterally.  Symmetric chest rise.  No wheezing, rales, rhonchi. Abdominal:     General: There is distension.     Palpations: Abdomen is soft. Abdomen is not rigid.     Tenderness: There is generalized abdominal tenderness. There is no right CVA tenderness, left CVA tenderness or guarding.     Comments: Slight abdominal distention.  Generalized tenderness noted with slight worse pain to the upper abd. Negative Murphy's sign.  No rigidity, guarding.  No CVA tenderness noted bilaterally.  Genitourinary:    Comments: The exam was performed with a chaperone present. Normal male genitalia. No evidence of rash, ulcers or lesions.  No tenderness noted bilateral testicles.  No overlying warmth, erythema, edema. Musculoskeletal:        General: Normal range of motion.     Cervical back: Full passive range of motion without pain.  Skin:    General: Skin is warm and dry.     Capillary Refill: Capillary refill takes less than 2 seconds.    Neurological:     Mental Status: He is alert and oriented to person, place, and time.  Psychiatric:        Speech: Speech normal.     ED Results / Procedures / Treatments   Labs (all labs ordered are listed, but only abnormal results are displayed) Labs Reviewed  LIPASE, BLOOD - Abnormal; Notable for the following components:      Result Value   Lipase 284 (*)    All other components within normal limits  COMPREHENSIVE METABOLIC PANEL - Abnormal; Notable for the following  components:   Sodium 128 (*)    Potassium 2.6 (*)    Chloride 89 (*)    CO2 17 (*)    Glucose, Bld 108 (*)    Total Protein 8.8 (*)    Albumin 2.8 (*)    AST 132 (*)    Alkaline Phosphatase 190 (*)    Total Bilirubin 34.8 (*)    Anion gap 22 (*)    All other components within normal limits  CBC - Abnormal; Notable for the following components:   WBC 17.9 (*)    RBC 3.43 (*)    Hemoglobin 11.1 (*)    HCT 31.7 (*)    RDW 18.8 (*)    All other components within normal limits  URINALYSIS, ROUTINE W REFLEX MICROSCOPIC - Abnormal; Notable for the following components:   Color, Urine AMBER (*)    APPearance HAZY (*)    Glucose, UA 50 (*)    Bilirubin Urine MODERATE (*)    Ketones, ur 20 (*)    Protein, ur 100 (*)    Bacteria, UA MANY (*)    All other components within normal limits  SARS CORONAVIRUS 2 BY RT PCR (HOSPITAL ORDER, [place] [facility])  PROTIME-INR  HEPATITIS PANEL, ACUTE  HIV ANTIBODY (ROUTINE TESTING W REFLEX)  TROPONIN I (HIGH SENSITIVITY)    EKG None  Radiology CT ABDOMEN PELVIS WO CONTRAST  Result Date: 11/23/2019 CLINICAL DATA:  Abdominal pain and nausea and vomiting for 2 months. Hematuria. EXAM: CT ABDOMEN AND PELVIS WITHOUT CONTRAST TECHNIQUE: Multidetector CT imaging of the abdomen and pelvis was performed following the standard protocol without IV contrast. COMPARISON:  None. FINDINGS: Lower chest: No acute findings. Hepatobiliary: Mild hepatomegaly and  moderate diffuse hepatic steatosis are noted. No mass visualized on this unenhanced exam. Recanalization of paraumbilical veins is seen and suspicious for portal venous hypertension. Gallbladder is unremarkable. No evidence of biliary ductal dilatation. Pancreas: Enlargement of the pancreatic head is seen with peripancreatic soft tissue swelling, consistent with acute pancreatitis. No peripancreatic fluid collections are seen. Spleen:  Within normal limits in size. Adrenals/Urinary tract: Normal adrenal glands. Absent left kidney. No evidence of right renal or ureteral calculi, or hydronephrosis. Unremarkable unopacified urinary bladder. Stomach/Bowel: No evidence of obstruction, inflammatory process, or abnormal fluid collections. Vascular/Lymphatic: No pathologically enlarged lymph nodes identified. No evidence of abdominal aortic aneurysm. Aortic atherosclerosis noted. Reproductive:  No mass or other significant abnormality. Other:  None. Musculoskeletal:  No suspicious bone lesions identified. IMPRESSION: 1. Acute focal pancreatitis involving the pancreatic head. Recommend continued follow-up by contrast enhanced CT to confirm resolution and exclude underlying neoplasm. 2. Hepatic steatosis and hepatomegaly. 3. Recanalization of paraumbilical veins is suspicious for portal venous hypertension due to underlying cirrhosis. 4. Absent left kidney. No evidence of right renal calculi or hydronephrosis. Aortic Atherosclerosis (ICD10-I70.0). Electronically Signed   By: Marlaine Hind M.D.   On: 11/23/2019 18:53   DG Chest 2 View  Result Date: 11/23/2019 CLINICAL DATA:  Preop EXAM: CHEST - 2 VIEW COMPARISON:  05/21/2016 FINDINGS: The heart size and mediastinal contours are within normal limits. Both lungs are clear. The visualized skeletal structures are unremarkable. IMPRESSION: No active cardiopulmonary disease. Electronically Signed   By: Rolm Baptise M.D.   On: 11/23/2019 20:25   US Abdomen Limited RUQ  Result  Date: 11/23/2019 CLINICAL DATA:  Abdominal pain EXAM: ULTRASOUND ABDOMEN LIMITED RIGHT UPPER QUADRANT COMPARISON:  None. FINDINGS: Gallbladder: Multiple layering stones and sludge within the gallbladder. Gallbladder wall  is thickened at 4 mm. Small amount of pericholecystic fluid. Common bile duct: Diameter: 5 mm in diameter Liver: Heterogeneous, increased echotexture with nodular contours suggesting cirrhosis. Portal vein is patent on color Doppler imaging with normal direction of blood flow towards the liver. Recanalized umbilical vein. Other: None. IMPRESSION: Increased echotexture throughout the liver with nodular contour suggesting cirrhosis. Recanalized umbilical vein compatible with portal venous hypertension. Stones and sludge noted within gallbladder with gallbladder wall thickening and a small amount of pericholecystic fluid. Cannot exclude acute cholecystitis. Electronically Signed   By: Rolm Baptise M.D.   On: 11/23/2019 18:01    Procedures Procedures (including critical care time)  Medications Ordered in ED Medications  LORazepam (ATIVAN) tablet 1-4 mg (has no administration in time range)    Or  LORazepam (ATIVAN) injection 1-4 mg (has no administration in time range)  thiamine tablet 100 mg (has no administration in time range)    Or  thiamine (B-1) injection 100 mg (has no administration in time range)  folic acid (FOLVITE) tablet 1 mg (has no administration in time range)  multivitamin with minerals tablet 1 tablet (has no administration in time range)  ondansetron (ZOFRAN) tablet 4 mg (has no administration in time range)    Or  ondansetron (ZOFRAN) injection 4 mg (has no administration in time range)  LORazepam (ATIVAN) injection 0-4 mg (has no administration in time range)    Followed by  LORazepam (ATIVAN) injection 0-4 mg (has no administration in time range)  lactated ringers infusion (has no administration in time range)  piperacillin-tazobactam (ZOSYN) IVPB 3.375 g  (3.375 g Intravenous New Bag/Given 11/23/19 2256)    Followed by  piperacillin-tazobactam (ZOSYN) IVPB 3.375 g (has no administration in time range)  ondansetron (ZOFRAN) injection 4 mg (4 mg Intravenous Given 11/23/19 1558)  morphine 4 MG/ML injection 4 mg (4 mg Intravenous Given 11/23/19 1558)  sodium chloride 0.9 % bolus 1,000 mL (0 mLs Intravenous Stopped 11/23/19 1804)  cefTRIAXone (ROCEPHIN) 2 g in sodium chloride 0.9 % 100 mL IVPB (0 g Intravenous Stopped 11/23/19 2001)  fentaNYL (SUBLIMAZE) injection 50 mcg (50 mcg Intravenous Given 11/23/19 1926)  ondansetron (ZOFRAN) injection 4 mg (4 mg Intravenous Given 11/23/19 1926)  gadobutrol (GADAVIST) 1 MMOL/ML injection 7 mL (7 mLs Intravenous Contrast Given 11/23/19 2224)    ED Course  I have reviewed the triage vital signs and the nursing notes.  Pertinent labs & imaging results that were available during my care of the patient were reviewed by me and considered in my medical decision making (see chart for details).    MDM Rules/Calculators/A&P                      49 year old male past medical history of renal disorder (kidney removed as a child), seizures who presents for evaluation of abdominal pain, nausea/vomiting, decreased appetite that has been ongoing for the last 4 to 5 days as well as hematuria that has been intermittently occurring over the last 2 months.  No fevers.  Really has not been able to tolerate much p.o. over the last 2 days.  On initially arrival, he is afebrile, nontoxic-appearing but does appear uncomfortable.  On exam, he is scleral icterus noted bilaterally.  He has generalized nominal pain, slightly worse in the upper region but no focal point.  Normal GU exam.  Consider viral etiology versus hepatobiliary theology.  Low suspicion for kidney stone given history/physical exam and duration of hematuria.  Additionally, low suspicion for  bowel obstruction.  We will plan to check labs.  Lipase is 284.  No priors for  comparison.  CBC shows leukocytosis of 17.9, hemoglobin of 11.1.  CMP shows potassium of 2.6.  Normal BUN and creatinine.  His alk phos is elevated at 190.  He has had elevations before but it looks like previously was between 130-150.  His total bili is 34.8.  This is a new elevation.  His AST is slightly elevated at 132.  Review of records show that he has had elevation since is actually proved from previous.  Given elevation in bilirubin, will plan for right upper quadrant sound ultrasound for evaluation of hepatobiliary etiology.  Right upper quadrant ultrasound shows stones and sludge noted within the gallbladder with gallbladder wall thickening and small amount of pericholecystic fluid.  Cannot exclude acute cholecystitis.  CT scan shows acute focal pancreatitis involving the pancreatic head.  Absent left kidney.  Discussed results with patient.  Discussed patient with Dr. Rush Landmark (GI). He feels like given the bilirubin, this is most likely alcoholic hepatitis.  He recommends getting a chest x-ray and urine to ensure there is no other infectious etiology that would preclude him from getting steroids.  Additionally, patient will need an MRCP to evaluate his common bile duct.  They will plan to consult tomorrow.  Patient be clear with liquids.  Discussed patient with Dr. Rosendo Gros (Gen Surg). [name], no indication for surgical intervention. He feels that the elevated bilirubin may be from other source. Will plan to consult on patient tomorrow.   Discussed patient with Dr. Hal Hope (hospitalist). Will accept patient for admission.   Portions of this note were generated with Lobbyist. Dictation errors may occur despite best attempts at proofreading.   Final Clinical Impression(s) / ED Diagnoses Final diagnoses:  Abdominal pain  Elevated bilirubin  Calculus of gallbladder without cholecystitis without obstruction  Hypokalemia  Alcohol-induced acute pancreatitis,  unspecified complication status    Rx / DC Orders ED Discharge Orders    None       Desma Mcgregor 11/23/19 2309    Dorie Rank, MD 11/24/19 1705

## 2019-11-24 DIAGNOSIS — E876 Hypokalemia: Secondary | ICD-10-CM

## 2019-11-24 DIAGNOSIS — K8020 Calculus of gallbladder without cholecystitis without obstruction: Secondary | ICD-10-CM

## 2019-11-24 DIAGNOSIS — K859 Acute pancreatitis without necrosis or infection, unspecified: Secondary | ICD-10-CM

## 2019-11-24 DIAGNOSIS — K7010 Alcoholic hepatitis without ascites: Secondary | ICD-10-CM

## 2019-11-24 DIAGNOSIS — F1010 Alcohol abuse, uncomplicated: Secondary | ICD-10-CM

## 2019-11-24 LAB — CBC WITH DIFFERENTIAL/PLATELET
Abs Immature Granulocytes: 0.18 10*3/uL — ABNORMAL HIGH (ref 0.00–0.07)
Basophils Absolute: 0.1 10*3/uL (ref 0.0–0.1)
Basophils Relative: 0 %
Eosinophils Absolute: 0.1 10*3/uL (ref 0.0–0.5)
Eosinophils Relative: 0 %
HCT: 27.5 % — ABNORMAL LOW (ref 39.0–52.0)
Hemoglobin: 10 g/dL — ABNORMAL LOW (ref 13.0–17.0)
Immature Granulocytes: 1 %
Lymphocytes Relative: 7 %
Lymphs Abs: 1.3 10*3/uL (ref 0.7–4.0)
MCH: 32.9 pg (ref 26.0–34.0)
MCHC: 36.4 g/dL — ABNORMAL HIGH (ref 30.0–36.0)
MCV: 90.5 fL (ref 80.0–100.0)
Monocytes Absolute: 1.8 10*3/uL — ABNORMAL HIGH (ref 0.1–1.0)
Monocytes Relative: 9 %
Neutro Abs: 16.1 10*3/uL — ABNORMAL HIGH (ref 1.7–7.7)
Neutrophils Relative %: 83 %
Platelets: 159 10*3/uL (ref 150–400)
RBC: 3.04 MIL/uL — ABNORMAL LOW (ref 4.22–5.81)
RDW: 18.5 % — ABNORMAL HIGH (ref 11.5–15.5)
WBC: 19.6 10*3/uL — ABNORMAL HIGH (ref 4.0–10.5)
nRBC: 0 % (ref 0.0–0.2)

## 2019-11-24 LAB — BASIC METABOLIC PANEL
Anion gap: 17 — ABNORMAL HIGH (ref 5–15)
BUN: 7 mg/dL (ref 6–20)
CO2: 20 mmol/L — ABNORMAL LOW (ref 22–32)
Calcium: 8.2 mg/dL — ABNORMAL LOW (ref 8.9–10.3)
Chloride: 91 mmol/L — ABNORMAL LOW (ref 98–111)
Creatinine, Ser: 0.92 mg/dL (ref 0.61–1.24)
GFR calc Af Amer: >60 mL/min (ref >60)
GFR calc non Af Amer: >60 mL/min (ref >60)
Glucose, Bld: 105 mg/dL — ABNORMAL HIGH (ref 70–99)
Potassium: 3.2 mmol/L — ABNORMAL LOW (ref 3.5–5.1)
Sodium: 128 mmol/L — ABNORMAL LOW (ref 135–145)

## 2019-11-24 LAB — HEPATIC FUNCTION PANEL
ALT: 35 U/L (ref 0–44)
AST: 93 U/L — ABNORMAL HIGH (ref 15–41)
Albumin: 2.4 g/dL — ABNORMAL LOW (ref 3.5–5.0)
Alkaline Phosphatase: 158 U/L — ABNORMAL HIGH (ref 38–126)
Bilirubin, Direct: 22.6 mg/dL — ABNORMAL HIGH (ref 0.0–0.2)
Indirect Bilirubin: 14.3 mg/dL — ABNORMAL HIGH (ref 0.3–0.9)
Total Bilirubin: 36.9 mg/dL (ref 0.3–1.2)
Total Protein: 8.1 g/dL (ref 6.5–8.1)

## 2019-11-24 LAB — SARS CORONAVIRUS 2 BY RT PCR (HOSPITAL ORDER, PERFORMED IN ~~LOC~~ HOSPITAL LAB): SARS Coronavirus 2: NEGATIVE

## 2019-11-24 LAB — HEPATITIS PANEL, ACUTE
HCV Ab: NONREACTIVE
Hep A IgM: NONREACTIVE
Hep B C IgM: NONREACTIVE
Hepatitis B Surface Ag: NONREACTIVE

## 2019-11-24 LAB — GLUCOSE, CAPILLARY
Glucose-Capillary: 113 mg/dL — ABNORMAL HIGH (ref 70–99)
Glucose-Capillary: 108 mg/dL — ABNORMAL HIGH (ref 70–99)

## 2019-11-24 LAB — PROTIME-INR
INR: 1.4 — ABNORMAL HIGH (ref 0.8–1.2)
Prothrombin Time: 16.4 seconds — ABNORMAL HIGH (ref 11.4–15.2)

## 2019-11-24 LAB — TROPONIN I (HIGH SENSITIVITY): Troponin I (High Sensitivity): 6 ng/L (ref <18)

## 2019-11-24 LAB — HIV ANTIBODY (ROUTINE TESTING W REFLEX): HIV Screen 4th Generation wRfx: NONREACTIVE

## 2019-11-24 MED ORDER — PANTOPRAZOLE SODIUM 40 MG IV SOLR
40.0000 mg | Freq: Once | INTRAVENOUS | Status: AC
  Administered 2019-11-24: 40 mg via INTRAVENOUS
  Filled 2019-11-24: qty 40

## 2019-11-24 MED ORDER — PHYTONADIONE 5 MG PO TABS
10.0000 mg | ORAL_TABLET | Freq: Every day | ORAL | Status: AC
  Administered 2019-11-24 – 2019-11-26 (×3): 10 mg via ORAL
  Filled 2019-11-24 (×3): qty 2

## 2019-11-24 MED ORDER — POTASSIUM CHLORIDE CRYS ER 20 MEQ PO TBCR: 40.0000 meq | EXTENDED_RELEASE_TABLET | Freq: Once | ORAL | Status: DC

## 2019-11-24 MED ORDER — POTASSIUM CHLORIDE 10 MEQ/100ML IV SOLN
10.0000 meq | INTRAVENOUS | Status: AC
  Administered 2019-11-24 (×4): 10 meq via INTRAVENOUS
  Filled 2019-11-24 (×3): qty 100

## 2019-11-24 MED ORDER — PANTOPRAZOLE SODIUM 40 MG IV SOLR: 40.0000 mg | INTRAVENOUS | Status: DC

## 2019-11-24 MED ORDER — LEVETIRACETAM 500 MG PO TABS
500.0000 mg | ORAL_TABLET | Freq: Two times a day (BID) | ORAL | Status: DC
  Administered 2019-11-24 – 2019-11-29 (×10): 500 mg via ORAL
  Filled 2019-11-24 (×10): qty 1

## 2019-11-24 MED ORDER — PANTOPRAZOLE SODIUM 40 MG PO TBEC
40.0000 mg | DELAYED_RELEASE_TABLET | Freq: Every day | ORAL | Status: DC
  Administered 2019-11-25 – 2019-11-29 (×5): 40 mg via ORAL
  Filled 2019-11-24 (×5): qty 1

## 2019-11-24 MED ORDER — PHENYTOIN SODIUM EXTENDED 100 MG PO CAPS
300.0000 mg | ORAL_CAPSULE | Freq: Every day | ORAL | Status: DC
  Administered 2019-11-24 – 2019-11-29 (×6): 300 mg via ORAL
  Filled 2019-11-24 (×6): qty 3

## 2019-11-24 NOTE — Progress Notes (Addendum)
CC: Abdominal pain, nausea and vomiting; hematuria x2 months  Subjective: Patient reports recurrent episodes of nausea and vomiting at home over the last 2 months.  He says the episodes last 3 to 4 days.  During that time he is not able to eat.  Episodes resolve when he is usually good for a couple weeks, before symptoms returned.  No diarrhea some intermittent constipation, he cannot relate episodes to p.o. intake or alcohol consumption.  He continues to drink 1.5 40's per day.  He came in this time because his symptoms have lasted longer than the other prior episodes.  He lives with his mother, that they apparently cooks separately.  He also works as a Dealer intermittently. He complains of some tenderness in his right upper quadrant on exam. No hx of drug use; tobacco use x 30 years.  Objective: Vital signs in last 24 hours: Temp:  [98.3 F (36.8 C)-98.9 F (37.2 C)] 98.9 F (37.2 C) (05/19 0521) Pulse Rate:  [96-109] 109 (05/19 0521) Resp:  [15-26] 17 (05/19 0521) BP: (110-153)/(71-95) 112/72 (05/19 0521) SpO2:  [96 %-100 %] 99 % (05/19 0521) Weight:  [63.1 kg] 63.1 kg (05/19 0500) Last BM Date: 11/21/19 N.p.o.  1114 IV 250 urine recorded Afebrile vital signs are stable, slightly tachycardic heart rate in the 100 range. K+ 2.6 NA 128 Total bilirubin 34.8  lipase 284 WBC 17.9 INR 1.4 A.m. labs are ordered and pending Intake/Output from previous day: 05/18 0701 - 05/19 0700 In: 1114.7 [I.V.:985.7; IV Piggyback:128.9] Out: 250 [Urine:250] Intake/Output this shift: No intake/output data recorded.  General appearance: alert, cooperative and no distress Resp: clear to auscultation bilaterally Cardio: Regular rate and rhythm slightly tachycardic GI: Abdomen is mildly distended, he has tenderness on palpation in the right upper quadrant.  Liver is palpable and enlarged.  He has a upper abdominal scar from his prior nephrectomy.  Positive bowel sounds. Extremities: Skin  changes lower extremities with good distal pulses bilaterally.  No edema.  Lab Results:  Recent Labs    11/23/19 1354  WBC 17.9*  HGB 11.1*  HCT 31.7*  PLT 190    BMET Recent Labs    11/23/19 1354  NA 128*  K 2.6*  CL 89*  CO2 17*  GLUCOSE 108*  BUN 8  CREATININE 0.86  CALCIUM 9.0   PT/INR Recent Labs    11/24/19 0009  LABPROT 16.4*  INR 1.4*    Recent Labs  Lab 11/23/19 1354  AST 132*  ALT 35  ALKPHOS 190*  BILITOT 34.8*  PROT 8.8*  ALBUMIN 2.8*     Lipase     Component Value Date/Time   LIPASE 284 (H) 11/23/2019 1354     Medications: . folic acid  1 mg Oral Daily  . LORazepam  0-4 mg Intravenous Q6H   Followed by  . [START ON 11/26/2019] LORazepam  0-4 mg Intravenous Q12H  . multivitamin with minerals  1 tablet Oral Daily  . thiamine  100 mg Oral Daily   Or  . thiamine  100 mg Intravenous Daily   . lactated ringers 100 mL/hr at 11/23/19 2343  . piperacillin-tazobactam 3.375 g (11/24/19 0443)  . potassium chloride 10 mEq (11/24/19 0713)    Assessment/Plan Hyponatremia Hypokalemia - K+ being replaced Hx tobacco use 30 yrs <1PPD    Abdominal pain, nausea and vomiting Cholelithiasis, possible cholecystitis,   - CBD 4 mm/GB wall thickening/pericholecystic fluid Possible cirrhosis with recanalization of umbilical vein/? portal hypertension Pancreatitis  -  lipase 284/CT: Enlargement of the pancreatic head is seen with       peripancreatic soft tissue swelling, consistent with acute    pancreatitis. No peripancreatic fluid collections are seen Hyperbilirubinemia -total bilirubin 34.8 Hx alcohol abuse Hx alcohol related seizures  - he reports last seizure about 3 mo ago/ED record 05/27/19 - RX for Keppra    and Dilantin Hx left nephrectomy  FEN: IV fluids/n.p.o. ID: Rocephin x1 dose 5/18; Zosyn 5/18 >> day 2 DVT: SCDs/INR 1.4  Plan: MRI shows a 4 cm ill-defined infiltrating pancreatic body lesion worrisome for an infiltrating  neoplasm/adenocarcinoma.  There is atrophy of the pancreatic tail and dilatation of the main pancreatic duct.  The liver shows cirrhosis with innumerable regenerating nodules.  Portal venous hypertension with portal venous collaterals no splenomegaly or ascites.  Cholelithiasis, small layering gallstones with no evidence of common bile duct dilatation.  Hepatitis panel was pending.  Sodium 128, potassium 3.2, alk phos 158, AST 93, ALT 35, direct bilirubin 22.6, indirect bilirubin 14.8, total bilirubin 36.9.  WBC 19.6, hemoglobin 10, hematocrit 27.5, platelets 159,000.   LOS: 1 day    Gary Mitchell 11/24/2019 Please see Amion

## 2019-11-24 NOTE — Consult Note (Addendum)
[facility] Consult: 8:19 AM 11/24/2019  LOS: 1 day    Referring Provider: Dr Thereasa Solo  Primary Care Physician:  Patient, No Pcp Per Primary Gastroenterologist:  none     Reason for Consultation:  abd pain.  Elevated LFTs, GB stones.    HPI: Gary Mitchell is a 49 y.o. male.  PMH of nephrectomy as infant. Seizures.  Noncompliance with seizure medications due to cost and lack of medical access.  Normally presents to the ED, often with seizures, and gets a prescription for antiseizure meds which subsequently run out.  Bouts of bilious emesis, upper abdominal and sometimes radiating to back pain over several months.  Pruritus.  Dark urine.  Anorexia.  No weight loss.  Current bout started last Thursday, 5 days prior to presenting to the ED yesterday. Lipase 284.  T bili 34.8.  Alkaline phosphatase 190.  AST/ALT 132/35. Normal renal function.  Sodium 128.  Potassium 2.6.  Additionally low chloride. WBCs 17.9.  Hb 11.1.  Platelets 190.  INR 1.4.  COVID-19 negative. Acute hepatitis panel pending. Abdominal ultrasound: cirrhotic looking liver.  Stones and sludge in gallbladder, gallbladder wall thickening and small pericholecystic fluid.  PV is patent with normal directional flow, recanalized umbilical vein. CTAP w/o contrast shows focal pancreatitis in the head.  Hepatomegaly, hepatic steatosis.  Recanalization of paraumbilical veins suspicious for portal hypertension.  Absent left kidney. MRI/MRCP: Await reading.  Patient admits to drinking 40 ounce beers every 1 to 2 days.  Previously would drink 4 to 540 ounce beers daily but decreased intake in the last 4 months, mostly because of the bouts of nausea and vomiting.  Pt is not employed.  He lives with his 107 year old mother.  2 previous Technical brewer, Dr. Rosendo Gros  has evaluated the patient and no plans at present for cholecystectomy, would want HIDA scan results prior to proceeding to cholecystectomy.  Suspect the hyperbilirubinemia is from alcohol.  Family history of leukemia in a brother.  Past Medical History:  Diagnosis Date  . 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 Seizures ([place])     Past Surgical History:  Procedure Laterality Date  . left kidney removed      Prior to Admission medications   Medication Sig Start Date End Date Taking? Authorizing Provider  ibuprofen (ADVIL,MOTRIN) 200 MG tablet Take 400-600 mg by mouth every 6 (six) hours as needed for moderate pain.   Yes [provider]  levETIRAcetam (KEPPRA) 500 MG tablet Take 1 tablet (500 mg total) by mouth 2 (two) times daily. 05/26/18   Virgel Manifold, MD  LORazepam (ATIVAN) 1 MG tablet Take 1 tablet (1 mg total) by mouth 3 (three) times daily as needed for anxiety. Patient not taking: Reported on 05/26/2018 07/03/15   Nat Christen, MD  phenytoin (DILANTIN) 100 MG ER capsule Take 3 capsules (300 mg total) by mouth daily. 05/26/18   Virgel Manifold, MD    Scheduled Meds: . folic acid  1 mg Oral Daily  . LORazepam  0-4 mg  Intravenous Q6H   Followed by  . [START ON 11/26/2019] LORazepam  0-4 mg Intravenous Q12H  . multivitamin with minerals  1 tablet Oral Daily  . thiamine  100 mg Oral Daily   Or  . thiamine  100 mg Intravenous Daily   Infusions: . lactated ringers 100 mL/hr at 11/24/19 0817  . piperacillin-tazobactam 3.375 g (11/24/19 0443)  . potassium chloride 10 mEq (11/24/19 0815)   PRN Meds: LORazepam **OR** LORazepam, ondansetron **OR** ondansetron (ZOFRAN) IV   Allergies as of 11/23/2019 - Review Complete 11/23/2019  Allergen Reaction Noted  . Iohexol Other (See Comments) 05/21/2016    Family History  Problem Relation Age of Onset  . Diabetes Mellitus II Mother     Social History   Socioeconomic History  . Marital status:  Single    Spouse name: Not on file  . Number of children: Not on file  . Years of education: Not on file  . Highest education level: Not on file  Occupational History  . Not on file  Tobacco Use  . Smoking status: Current Every Day Smoker    Packs/day: 2.00    Years: 25.00    Pack years: 50.00  . Smokeless tobacco: Never Used  Substance and Sexual Activity  . Alcohol use: Yes    Alcohol/week: 3.0 standard drinks    Types: 3 Cans of beer per week  . Drug use: Yes    Types: Marijuana  . Sexual activity: Not on file  Other Topics Concern  . Not on file  Social History Narrative  . Not on file   Social Determinants of Health   Financial Resource Strain:   . Difficulty of Paying Living Expenses:   Food Insecurity:   . Worried About Charity fundraiser in the Last Year:   . Arboriculturist in the Last Year:   Transportation Needs:   . Film/video editor (Medical):   Marland Kitchen Lack of Transportation (Non-Medical):   Physical Activity:   . Days of Exercise per Week:   . Minutes of Exercise per Session:   Stress:   . Feeling of Stress :   Social Connections:   . Frequency of Communication with Friends and Family:   . Frequency of Social Gatherings with Friends and Family:   . Attends Religious Services:   . Active Member of Clubs or Organizations:   . Attends Archivist Meetings:   Marland Kitchen Marital Status:   Intimate Partner Violence:   . Fear of Current or Ex-Partner:   . Emotionally Abused:   Marland Kitchen Physically Abused:   . Sexually Abused:     REVIEW OF SYSTEMS: Constitutional: Tired, weak. ENT:  No nose bleeds Pulm: No shortness of breath or cough. CV:  No palpitations, no LE edema.  No angina GU:  No hematuria, no frequency GI: See HPI. Heme: No excessive or unusual bleeding or bruising. Transfusions: None Neuro:  No headaches, no peripheral tingling or numbness Derm:  No itching, no rash or sores.  Endocrine:  No sweats or chills.  No polyuria or  dysuria Immunization: Not queried. Travel:  None beyond local counties in last few months.    PHYSICAL EXAM: Vital signs in last 24 hours: Vitals:   11/24/19 0431 11/24/19 0521  BP: 110/72 112/72  Pulse: (!) 109 (!) 109  Resp: 18 17  Temp: 98.9 F (37.2 C) 98.9 F (37.2 C)  SpO2: 100% 99%   Wt Readings from Last 3 Encounters:  11/24/19 63.1  kg  04/25/17 68 kg  03/05/17 68 kg    General: Patient looks ill, malnourished.  Currently comfortable. Head: No facial asymmetry or swelling.  No signs of head trauma. Eyes: Icteric sclera.  No conjunctival pallor.  EOMI Ears: Not hard of hearing Nose: No discharge or congestion Mouth: Good dentition.  Oral mucosa moist, pink, clear.  Tongue midline Neck: No JVD, thyromegaly, bruits Lungs: Clear bilaterally without labored breathing.  No cough Heart: RRR.  No MRG.  S1, S2 present. Abdomen: Not distended.  Hepatomegaly.  Tender mostly in the right upper quadrant, epigastrium without rebound or guarding.  Bowel sounds hypoactive but normal quality.  No hernias.  No bruits..   Rectal: Deferred Musc/Skeltl: No joint redness, deformity or swelling. Extremities: No CCE. Neurologic: Alert.  Oriented x3.  No asterixis, no tremors.  Moves all 4 limbs, strength not tested but grossly normal. Skin: Multiple hyperpigmented scars and some excoriations on the legs and less so on the arms. Nodes: No cervical adenopathy Psych: Affect flat.  Cooperative.  Fluid speech though somewhat laconic.  Intake/Output from previous day: 05/18 0701 - 05/19 0700 In: 1114.7 [I.V.:985.7; IV Piggyback:128.9] Out: 250 [Urine:250] Intake/Output this shift: No intake/output data recorded.  LAB RESULTS: Recent Labs    11/23/19 1354  WBC 17.9*  HGB 11.1*  HCT 31.7*  PLT 190   BMET Lab Results  Component Value Date   NA 128 (L) 11/23/2019   NA 134 (L) 05/26/2018   NA 135 04/25/2017   K 2.6 (LL) 11/23/2019   K 3.3 (L) 05/26/2018   K 3.2 (L) 04/25/2017    CL 89 (L) 11/23/2019   CL 98 05/26/2018   CL 102 04/25/2017   CO2 17 (L) 11/23/2019   CO2 24 05/26/2018   CO2 21 (L) 04/25/2017   GLUCOSE 108 (H) 11/23/2019   GLUCOSE 95 05/26/2018   GLUCOSE 116 (H) 04/25/2017   BUN 8 11/23/2019   BUN 7 05/26/2018   BUN <5 (L) 04/25/2017   CREATININE 0.86 11/23/2019   CREATININE 0.62 05/26/2018   CREATININE 0.61 04/25/2017   CALCIUM 9.0 11/23/2019   CALCIUM 9.0 05/26/2018   CALCIUM 9.0 04/25/2017   LFT Recent Labs    11/23/19 1354  PROT 8.8*  ALBUMIN 2.8*  AST 132*  ALT 35  ALKPHOS 190*  BILITOT 34.8*   PT/INR Lab Results  Component Value Date   INR 1.4 (H) 11/24/2019   Hepatitis Panel No results for input(s): HEPBSAG, HCVAB, HEPAIGM, HEPBIGM in the last 72 hours. C-Diff No components found for: CDIFF Lipase     Component Value Date/Time   LIPASE 284 (H) 11/23/2019 1354    Drugs of Abuse     Component Value Date/Time   LABOPIA NONE DETECTED 03/05/2017 1542   COCAINSCRNUR NONE DETECTED 03/05/2017 1542   LABBENZ NONE DETECTED 03/05/2017 1542   AMPHETMU NONE DETECTED 03/05/2017 1542   THCU NONE DETECTED 03/05/2017 1542   LABBARB NONE DETECTED 03/05/2017 1542     RADIOLOGY STUDIES: CT ABDOMEN PELVIS WO CONTRAST  Result Date: 11/23/2019 CLINICAL DATA:  Abdominal pain and nausea and vomiting for 2 months. Hematuria. EXAM: CT ABDOMEN AND PELVIS WITHOUT CONTRAST TECHNIQUE: Multidetector CT imaging of the abdomen and pelvis was performed following the standard protocol without IV contrast. COMPARISON:  None. FINDINGS: Lower chest: No acute findings. Hepatobiliary: Mild hepatomegaly and moderate diffuse hepatic steatosis are noted. No mass visualized on this unenhanced exam. Recanalization of paraumbilical veins is seen and suspicious for portal venous hypertension. Gallbladder is  unremarkable. No evidence of biliary ductal dilatation. Pancreas: Enlargement of the pancreatic head is seen with peripancreatic soft tissue swelling,  consistent with acute pancreatitis. No peripancreatic fluid collections are seen. Spleen:  Within normal limits in size. Adrenals/Urinary tract: Normal adrenal glands. Absent left kidney. No evidence of right renal or ureteral calculi, or hydronephrosis. Unremarkable unopacified urinary bladder. Stomach/Bowel: No evidence of obstruction, inflammatory process, or abnormal fluid collections. Vascular/Lymphatic: No pathologically enlarged lymph nodes identified. No evidence of abdominal aortic aneurysm. Aortic atherosclerosis noted. Reproductive:  No mass or other significant abnormality. Other:  None. Musculoskeletal:  No suspicious bone lesions identified. IMPRESSION: 1. Acute focal pancreatitis involving the pancreatic head. Recommend continued follow-up by contrast enhanced CT to confirm resolution and exclude underlying neoplasm. 2. Hepatic steatosis and hepatomegaly. 3. Recanalization of paraumbilical veins is suspicious for portal venous hypertension due to underlying cirrhosis. 4. Absent left kidney. No evidence of right renal calculi or hydronephrosis. Aortic Atherosclerosis (ICD10-I70.0). Electronically Signed   By: Marlaine Hind M.D.   On: 11/23/2019 18:53   DG Chest 2 View  Result Date: 11/23/2019 CLINICAL DATA:  Preop EXAM: CHEST - 2 VIEW COMPARISON:  05/21/2016 FINDINGS: The heart size and mediastinal contours are within normal limits. Both lungs are clear. The visualized skeletal structures are unremarkable. IMPRESSION: No active cardiopulmonary disease. Electronically Signed   By: Rolm Baptise M.D.   On: 11/23/2019 20:25   US Abdomen Limited RUQ  Result Date: 11/23/2019 CLINICAL DATA:  Abdominal pain EXAM: ULTRASOUND ABDOMEN LIMITED RIGHT UPPER QUADRANT COMPARISON:  None. FINDINGS: Gallbladder: Multiple layering stones and sludge within the gallbladder. Gallbladder wall is thickened at 4 mm. Small amount of pericholecystic fluid. Common bile duct: Diameter: 5 mm in diameter Liver: Heterogeneous,  increased echotexture with nodular contours suggesting cirrhosis. Portal vein is patent on color Doppler imaging with normal direction of blood flow towards the liver. Recanalized umbilical vein. Other: None. IMPRESSION: Increased echotexture throughout the liver with nodular contour suggesting cirrhosis. Recanalized umbilical vein compatible with portal venous hypertension. Stones and sludge noted within gallbladder with gallbladder wall thickening and a small amount of pericholecystic fluid. Cannot exclude acute cholecystitis. Electronically Signed   By: Rolm Baptise M.D.   On: 11/23/2019 18:01      IMPRESSION:   *    Acute pancreatitis, ?  Due to ETOH and/or biliary?  Suspect it is alcohol in origin.  *     Acute hepatitis, discr fx score is 53.  Possible cirrhosis of the liver, hepatomegaly.  History EtOH abuse.  *     CT imaging suspicious for portal hypertension  *     Hyponatremia, hypokalemia.  *    Elevated INR.    PLAN:     *    Await hepatitis serologies and MRCP reading.  *   Obtain ANA, smooth muscle antibody, mitochondrial antibody, IgG, ceruloplasmin, alpha-1 antitrypsin. Follow LFTs and INR.  *    ? Duration of Zosyn.  ? Initiate Prednisolone.  Wait for MRCP results to help w decision making.   Adding daily Protonix.    *   Allow clears, up rate LR to 150/hour.  Is/os.     Azucena Freed  11/24/2019, 8:19 AM Phone [phone]  ________________________________________________________________________  Velora Heckler GI MD note:  I personally examined the patient, reviewed the data and agree with the assessment and plan described above.  I spoke with radiologist about the MRI and reviewed the images. See impression below.  He clearly has cirrhosis (from previous  alcoholism, still drinks daily but not like 20 years ago) and portal hypertension.  He has gallstones in his gallbladder. He has acute pancreatitis (alcohol related vs. gallstone related? Will be hard to know  for certain).  His LFTs are very elevated.  He may have acute alcoholic hepatitis on top of the cirrhosis. Tbili in the 30s is much higher than usual but there are NO signs on any imaging that he has biliary obstruction.  He has a very abnormal lesion in the body of his pancreas that looks like an infiltrating cancer.  He has not been losing weight which would be unusual if he truly has a pancreatic cancer.  He will need eventual biopsy of his pancreas by EUS, FNA. Timing to be determined; usually it is best that biopsy of the pancreas NOT be done during an acute illness (such as his acute pancreatitis, alcoholic hepatitis).  He is not very tender this morning and since there is quite a bit of possibilities here I think it is best to proceed with biopsy of the pancreas this admission, probably Friday to at least another 24 hours of time, repeat labs. I would not start him on steroids for the possible alc hepatitis for now, too many unanswered questions.  Continue IV fluids, ok to test him on clear liquids for now.  Repeat labs in AM. Await acute hepatitis viral testing.  ?HIDA as suggested by surgery.     IMPRESSION: 1. 4 cm ill-defined infiltrating pancreatic body lesion worrisome for infiltrating neoplasm/adenocarcinoma. This is associated with marked atrophy of the pancreatic tail and dilatation of the main pancreatic duct. Recommend EUS and biopsy. 2. Ill-defined area of decreased perfusion in the pancreatic head could reflect focal pancreatitis but could not exclude a small lesion measuring 14 mm in the pancreatic head also. 3. Inflammation in and around the pancreatic head consistent with acute pancreatitis which could be gallstone related. 4. Cirrhosis with innumerable regenerating nodules throughout the liver but no early arterial phase enhancing hepatic lesions to suggest dysplastic nodules or HCC. Fatty infiltration is also noted. 5. Portal venous hypertension with portal venous  collaterals but no splenomegaly or ascites. 6. Cholelithiasis. 7. Enlarged peripancreatic and celiac axis lymph nodes worrisome for metastatic adenopathy.   Owens Loffler, MD [facility] Pager [phone]

## 2019-11-24 NOTE — Progress Notes (Signed)
Gary Mitchell  [id number] DOB: 1970-10-17 DOA: 11/23/2019 PCP: Patient, No Pcp Per    Brief Narrative:  49 year old with a history of alcoholism and alcohol-related seizures who presented to the ED with abdominal pain nausea and vomiting x5 days.In the ED CT abdomen and pelvis noted findings consistent with pancreatitis as well as probable portal hypertension.  Significant Events: 5/18 admit via [place]  Antimicrobials:  Rocephin 5/18 Zosyn 5/18 >  Subjective: Sleeping heavily at time of my visit.  Does not awaken to exam or to verbal cues.  No respiratory distress whatsoever.  Resting very comfortably.  Assessment & Plan:  Acute alcohol-related pancreatitis Clear liquid only -continue to counsel alcohol abstinence  Alcoholic hepatitis Discriminant score 53 -GI does not feel steroids are safe at present  Hyponatremia Due to alcoholism and possibly cirrhosis -monitor trend  Hypokalemia Replace -check magnesium in a.m.  Abnormal lesion in body of pancreas Will need eventual biopsy by EUS -plan is to pursue this likely during the latter portion of this hospital stay  Cholelithiasis Being evaluated by GI  Alcoholism  Anemia Due to above -no evidence of significant acute blood loss -follow trend  Alcohol related seizures  DVT prophylaxis: SCDs Code Status: FULL CODE Family Communication:  Status is: Inpatient  Remains inpatient appropriate because:Ongoing diagnostic testing needed not appropriate for outpatient work up   Dispo: The patient is from: Home              Anticipated d/c is to: Home              Anticipated d/c date is: 3 days              Patient currently is not medically stable to d/c.  Consultants:  General surgery [facility]  Objective: Blood pressure 127/85, pulse (!) 110, temperature 97.9 F (36.6 C), temperature source Oral, resp. rate 18, weight 63.1 kg, SpO2 100 %.  Intake/Output Summary (Last 24 hours) at 11/24/2019  1058 Last data filed at 11/24/2019 0830 Gross per 24 hour  Intake 1114.65 ml  Output 250 ml  Net 864.65 ml   Filed Weights   11/24/19 0500  Weight: 63.1 kg    Examination: General: No acute respiratory distress Lungs: Clear to auscultation bilaterally without wheezes or crackles Cardiovascular: Regular rate and rhythm without murmur gallop or rub normal S1 and S2 Abdomen: Nondistended, soft, bowel sounds positive Extremities: No significant cyanosis, clubbing, or edema bilateral lower extremities  CBC: Recent Labs  Lab 11/23/19 1354 11/24/19 0832  WBC 17.9* 19.6*  NEUTROABS  --  16.1*  HGB 11.1* 10.0*  HCT 31.7* 27.5*  MCV 92.4 90.5  PLT 190 [id number]   Basic Metabolic Panel: Recent Labs  Lab 11/23/19 1354 11/24/19 0832  NA 128* 128*  K 2.6* 3.2*  CL 89* 91*  CO2 17* 20*  GLUCOSE 108* 105*  BUN 8 7  CREATININE 0.86 0.92  CALCIUM 9.0 8.2*   GFR: CrCl cannot be calculated (Unknown ideal weight.).  Liver Function Tests: Recent Labs  Lab 11/23/19 1354 11/24/19 0832  AST 132* 93*  ALT 35 35  ALKPHOS 190* 158*  BILITOT 34.8* 36.9*  PROT 8.8* 8.1  ALBUMIN 2.8* 2.4*   Recent Labs  Lab 11/23/19 1354  LIPASE 284*    Coagulation Profile: Recent Labs  Lab 11/24/19 0009  INR 1.4*    CBG: Recent Labs  Lab 11/23/19 2352 11/24/19 0750  GLUCAP 77 113*    Recent Results (from the past 240  hour(s))  SARS Coronavirus 2 by RT PCR (hospital order, performed in [facility] hospital lab) Nasopharyngeal Nasopharyngeal Swab     Status: None   Collection Time: 11/23/19 10:47 PM   Specimen: Nasopharyngeal Swab  Result Value Ref Range Status   SARS Coronavirus 2 NEGATIVE NEGATIVE Final    Comment: (NOTE) SARS-CoV-2 target nucleic acids are NOT DETECTED. The SARS-CoV-2 RNA is generally detectable in upper and lower respiratory specimens during the acute phase of infection. The lowest concentration of SARS-CoV-2 viral copies this assay can detect is 250 copies /  mL. A negative result does not preclude SARS-CoV-2 infection and should not be used as the sole basis for treatment or other patient management decisions.  A negative result may occur with improper specimen collection / handling, submission of specimen other than nasopharyngeal swab, presence of viral mutation(s) within the areas targeted by this assay, and inadequate number of viral copies (<250 copies / mL). A negative result must be combined with clinical observations, patient history, and epidemiological information. Fact Sheet for Patients:   StrictlyIdeas.no Fact Sheet for Healthcare Providers: BankingDealers.co.za This test is not yet approved or cleared  by the [place] FDA and has been authorized for detection and/or diagnosis of SARS-CoV-2 by FDA under an Emergency Use Authorization (EUA).  This EUA will remain in effect (meaning this test can be used) for the duration of the COVID-19 declaration under Section 564(b)(1) of the Act, 21 U.S.C. section 360bbb-3(b)(1), unless the authorization is terminated or revoked sooner. Performed at [facility], [place] [address], [place], [place] [address]      Scheduled Meds: . folic acid  1 mg Oral Daily  . LORazepam  0-4 mg Intravenous Q6H   Followed by  . [START ON 11/26/2019] LORazepam  0-4 mg Intravenous Q12H  . multivitamin with minerals  1 tablet Oral Daily  . [START ON 11/25/2019] pantoprazole  40 mg Oral Daily  . phytonadione  10 mg Oral Daily  . thiamine  100 mg Oral Daily   Or  . thiamine  100 mg Intravenous Daily   Continuous Infusions: . lactated ringers 150 mL/hr at 11/24/19 1048  . piperacillin-tazobactam 3.375 g (11/24/19 0443)     LOS: 1 day   Cherene Altes, MD [facility] Office  [phone] Pager - Text Page per Amion  If 7PM-7AM, please contact night-coverage per Amion 11/24/2019, 10:58 AM

## 2019-11-24 NOTE — Consult Note (Signed)
Reason for Consult: Questionable cholecystitis Referring Physician: Dr. Nelda Severe is an 49 y.o. male.  HPI: Patient is a 49 year old male, who comes in he has not been able to tolerate any p.o., has had abdominal pain, and has had some nausea and vomiting.  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  Patient states his abdominal pain is in the right lower quadrant area.  He states that he has had no hematemesis.  Patient states his urine has been dark for approximately 3 months.  Patient underwent CT scan and ultrasound.  Ultrasound did show stones and sludge within the gallbladder with gallbladder wall thickening and small amount of pericholecystic fluid.  Patient common bile duct was 5 mm.  CT scan was significant for pancreatitis involving the pancreatic head.  GI was consulted.  Patient underwent MRCP.  Results currently pending.  General surgery was consulted for further evaluation.  Patient does have a history of significant alcohol abuse, currently states that he drinks 1 40 ounce beers in a day and a half.  He states he used to drink 4 to 5 40 ounce beers in a day and recently decrease it in the last 3 to 4 months.  Patient states his brother has leukemia.  Past Medical History:  Diagnosis Date  . 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 Seizures ([place])     Past Surgical History:  Procedure Laterality Date  . left kidney removed      Family History  Problem Relation Age of Onset  . Diabetes Mellitus II Mother     Social History:  reports that he has been smoking. He has a 50.00 pack-year smoking history. He has never used smokeless tobacco. He reports current alcohol use of about 3.0 standard drinks of alcohol per week. He reports current drug use. Drug: Marijuana.  Allergies:  Allergies  Allergen Reactions  . Iohexol Other (See Comments)    Unknown reaction at 46 days old    Medications: I have reviewed the  patient's current medications.  Results for orders placed or performed during the hospital encounter of 11/23/19 (from the past 48 hour(s))  Lipase, blood     Status: Abnormal   Collection Time: 11/23/19  1:54 PM  Result Value Ref Range   Lipase 284 (H) 11 - 51 U/L    Comment: Performed at [facility], [place] [address], [place], [place] [address]  Comprehensive metabolic panel     Status: Abnormal   Collection Time: 11/23/19  1:54 PM  Result Value Ref Range   Sodium 128 (L) 135 - 145 mmol/L   Potassium 2.6 (LL) 3.5 - 5.1 mmol/L    Comment: CRITICAL RESULT CALLED TO, READ BACK BY AND VERIFIED WITH: S.TOWNES,RN 1525 11/23/19 CLARK,S    Chloride 89 (L) 98 - 111 mmol/L   CO2 17 (L) 22 - 32 mmol/L   Glucose, Bld 108 (H) 70 - 99 mg/dL    Comment: Glucose reference range applies only to samples taken after fasting for at least 8 hours.   BUN 8 6 - 20 mg/dL   Creatinine, Ser 0.86 0.61 - 1.24 mg/dL   Calcium 9.0 8.9 - 10.3 mg/dL   Total Protein 8.8 (H) 6.5 - 8.1 g/dL   Albumin 2.8 (L) 3.5 - 5.0 g/dL   AST 132 (H) 15 - 41 U/L   ALT 35 0 - 44 U/L   Alkaline Phosphatase 190 (  H) 38 - 126 U/L   Total Bilirubin 34.8 (HH) 0.3 - 1.2 mg/dL    Comment: RESULTS CONFIRMED BY MANUAL DILUTION CRITICAL RESULT CALLED TO, READ BACK BY AND VERIFIED WITH: L SHULER,RN 1554 11/23/2019 WBOND    GFR calc non Af Amer >60 >60 mL/min   GFR calc Af Amer >60 >60 mL/min   Anion gap 22 (H) 5 - 15    Comment: Performed at [facility] [address], [place], [place] [address]  CBC     Status: Abnormal   Collection Time: 11/23/19  1:54 PM  Result Value Ref Range   WBC 17.9 (H) 4.0 - 10.5 K/uL   RBC 3.43 (L) 4.22 - 5.81 MIL/uL   Hemoglobin 11.1 (L) 13.0 - 17.0 g/dL   HCT 31.7 (L) 39.0 - 52.0 %   MCV 92.4 80.0 - 100.0 fL   MCH 32.4 26.0 - 34.0 pg   MCHC 35.0 30.0 - 36.0 g/dL   RDW 18.8 (H) 11.5 - 15.5 %   Platelets 190 150 - 400 K/uL   nRBC 0.0 0.0 - 0.2 %    Comment: Performed at [facility] [address], [place], [place] [address]  Urinalysis, Routine w reflex microscopic     Status: Abnormal   Collection Time: 11/23/19  3:46 PM  Result Value Ref Range   Color, Urine AMBER (A) YELLOW    Comment: BIOCHEMICALS MAY BE AFFECTED BY COLOR   APPearance HAZY (A) CLEAR   Specific Gravity, Urine 1.025 1.005 - 1.030   pH 5.0 5.0 - 8.0   Glucose, UA 50 (A) NEGATIVE mg/dL   Hgb urine dipstick NEGATIVE NEGATIVE   Bilirubin Urine MODERATE (A) NEGATIVE   Ketones, ur 20 (A) NEGATIVE mg/dL   Protein, ur 100 (A) NEGATIVE mg/dL   Nitrite NEGATIVE NEGATIVE   Leukocytes,Ua NEGATIVE NEGATIVE   WBC, UA 0-5 0 - 5 WBC/hpf   Bacteria, UA MANY (A) NONE SEEN   Squamous Epithelial / LPF 0-5 0 - 5   Mucus PRESENT    Hyaline Casts, UA PRESENT     Comment: Performed at [facility], [place] [address] [place],  [address]  SARS Coronavirus 2 by RT PCR (hospital order, performed in [facility] hospital lab) Nasopharyngeal Nasopharyngeal Swab     Status: None   Collection Time: 11/23/19 10:47 PM   Specimen: Nasopharyngeal Swab  Result Value Ref Range   SARS Coronavirus 2 NEGATIVE NEGATIVE    Comment: (NOTE) SARS-CoV-2 target nucleic acids are NOT DETECTED. The SARS-CoV-2 RNA is generally detectable in upper and lower respiratory specimens during the acute phase of infection. The lowest concentration of SARS-CoV-2 viral copies this assay can detect is 250 copies / mL. A negative result does not preclude SARS-CoV-2 infection and should not be used as the sole basis for treatment or other patient management decisions.  A negative result may occur with improper specimen collection / handling, submission of specimen other than nasopharyngeal swab, presence of viral mutation(s) within the areas targeted by this assay, and inadequate number of viral copies (<250 copies / mL). A negative result must be combined with clinical observations, patient history, and epidemiological  information. Fact Sheet for Patients:   StrictlyIdeas.no Fact Sheet for Healthcare Providers: BankingDealers.co.za This test is not yet approved or cleared  by the [place] FDA and has been authorized for detection and/or diagnosis of SARS-CoV-2 by FDA under an Emergency Use Authorization (EUA).  This EUA will remain  in effect (meaning this test can be used) for the duration of the COVID-19 declaration under Section 564(b)(1) of the Act, 21 U.S.C. section 360bbb-3(b)(1), unless the authorization is terminated or revoked sooner. Performed at [facility], [place] [address], [place], [place] [address]   Glucose, capillary     Status: None   Collection Time: 11/23/19 11:52 PM  Result Value Ref Range   Glucose-Capillary 77 70 - 99 mg/dL    Comment: Glucose reference range applies only to samples taken after fasting for at least 8 hours.  Protime-INR     Status: Abnormal   Collection Time: 11/24/19 12:09 AM  Result Value Ref Range   Prothrombin Time 16.4 (H) 11.4 - 15.2 seconds   INR 1.4 (H) 0.8 - 1.2    Comment: (NOTE) INR goal varies based on device and disease states. Performed at [facility], [place] [address], [place], [place] [address]   Troponin I (High Sensitivity)     Status: None   Collection Time: 11/24/19 12:09 AM  Result Value Ref Range   Troponin I (High Sensitivity) 6 <18 ng/L    Comment: (NOTE) Elevated high sensitivity troponin I (hsTnI) values and significant  changes across serial measurements may suggest ACS but many other  chronic and acute conditions are known to elevate hsTnI results.  Refer to the "Links" section for chest pain algorithms and additional  guidance. Performed at [facility], [place] [address], [place], [place] [address]     CT ABDOMEN PELVIS WO CONTRAST  Result Date: 11/23/2019 CLINICAL DATA:  Abdominal pain and nausea and vomiting for 2 months. Hematuria. EXAM: CT ABDOMEN  AND PELVIS WITHOUT CONTRAST TECHNIQUE: Multidetector CT imaging of the abdomen and pelvis was performed following the standard protocol without IV contrast. COMPARISON:  None. FINDINGS: Lower chest: No acute findings. Hepatobiliary: Mild hepatomegaly and moderate diffuse hepatic steatosis are noted. No mass visualized on this unenhanced exam. Recanalization of paraumbilical veins is seen and suspicious for portal venous hypertension. Gallbladder is unremarkable. No evidence of biliary ductal dilatation. Pancreas: Enlargement of the pancreatic head is seen with peripancreatic soft tissue swelling, consistent with acute pancreatitis. No peripancreatic fluid collections are seen. Spleen:  Within normal limits in size. Adrenals/Urinary tract: Normal adrenal glands. Absent left kidney. No evidence of right renal or ureteral calculi, or hydronephrosis. Unremarkable unopacified urinary bladder. Stomach/Bowel: No evidence of obstruction, inflammatory process, or abnormal fluid collections. Vascular/Lymphatic: No pathologically enlarged lymph nodes identified. No evidence of abdominal aortic aneurysm. Aortic atherosclerosis noted. Reproductive:  No mass or other significant abnormality. Other:  None. Musculoskeletal:  No suspicious bone lesions identified. IMPRESSION: 1. Acute focal pancreatitis involving the pancreatic head. Recommend continued follow-up by contrast enhanced CT to confirm resolution and exclude underlying neoplasm. 2. Hepatic steatosis and hepatomegaly. 3. Recanalization of paraumbilical veins is suspicious for portal venous hypertension due to underlying cirrhosis. 4. Absent left kidney. No evidence of right renal calculi or hydronephrosis. Aortic Atherosclerosis (ICD10-I70.0). Electronically Signed   By: Marlaine Hind M.D.   On: 11/23/2019 18:53   DG Chest 2 View  Result Date: 11/23/2019 CLINICAL DATA:  Preop EXAM: CHEST - 2 VIEW COMPARISON:  05/21/2016 FINDINGS: The heart size and mediastinal contours  are within normal limits. Both lungs are clear. The visualized skeletal structures are unremarkable. IMPRESSION: No active cardiopulmonary disease. Electronically Signed   By: Rolm Baptise M.D.   On: 11/23/2019 20:25   US Abdomen Limited RUQ  Result Date: 11/23/2019 CLINICAL DATA:  Abdominal pain EXAM: ULTRASOUND ABDOMEN LIMITED RIGHT UPPER QUADRANT COMPARISON:  None. FINDINGS: Gallbladder: Multiple layering stones and sludge within the gallbladder. Gallbladder wall is thickened at 4 mm. Small amount of pericholecystic fluid. Common bile duct: Diameter: 5 mm in diameter Liver: Heterogeneous, increased echotexture with nodular contours suggesting cirrhosis. Portal vein is patent on color Doppler imaging with normal direction of blood flow towards the liver. Recanalized umbilical vein. Other: None. IMPRESSION: Increased echotexture throughout the liver with nodular contour suggesting cirrhosis. Recanalized umbilical vein compatible with portal venous hypertension. Stones and sludge noted within gallbladder with gallbladder wall thickening and a small amount of pericholecystic fluid. Cannot exclude acute cholecystitis. Electronically Signed   By: Rolm Baptise M.D.   On: 11/23/2019 18:01    Review of Systems  HENT: Negative for ear discharge, ear pain, hearing loss and tinnitus.   Eyes: Negative for photophobia and pain.  Respiratory: Negative for cough and shortness of breath.   Cardiovascular: Negative for chest pain.  Gastrointestinal: Negative for abdominal pain, nausea and vomiting.  Genitourinary: Negative for dysuria, flank pain, frequency and urgency.  Musculoskeletal: Negative for back pain, myalgias and neck pain.  Neurological: Negative for dizziness and headaches.  Hematological: Does not bruise/bleed easily.  Psychiatric/Behavioral: The patient is not nervous/anxious.    Blood pressure 112/72, pulse (!) 109, temperature 98.9 F (37.2 C), temperature source Oral, resp. rate 17, weight  63.1 kg, SpO2 99 %. Physical Exam  Constitutional: He is oriented to person, place, and time. Vital signs are normal. He appears well-developed and well-nourished.  Conversant No acute distress  Eyes: Lids are normal. No scleral icterus.  Pupils are equal round and reactive No lid lag Moist conjunctiva  Neck: No tracheal tenderness present. No thyromegaly present.  No cervical lymphadenopathy  Cardiovascular: Normal rate, regular rhythm and intact distal pulses.  No murmur heard. Respiratory: Effort normal and breath sounds normal. He has no wheezes. He has no rales.  GI: Soft. Bowel sounds are normal. He exhibits no distension and no mass. There is no hepatosplenomegaly. There is abdominal tenderness (RLQ). There is no rebound and no guarding. No hernia.  Neurological: He is alert and oriented to person, place, and time.  Normal gait and station  Skin: Skin is warm. No rash noted. No cyanosis. Nails show no clubbing.  Normal skin turgor  Psychiatric: Judgment normal.  Appropriate affect    Assessment/Plan: 49 year old male with hyperbilirubinemia  Pancreatitis alcohol abuse  1.  We will follow MRCP. 2.  Patient would likely benefit from HIDA scan prior to any plans for cholecystectomy.  It is possible that his hyperbilirubinemia is coming from his chronic pancreatitis likely due to alcoholism. 3.  Hepatitis panel pending. 4.  We will follow along.  Ralene Ok 11/24/2019, 6:46 AM

## 2019-11-24 NOTE — TOC Initial Note (Signed)
Transition of Care [facility]) - Initial/Assessment Note    Patient Details  Name: Gary Mitchell MRN: [id number] Date of Birth: Aug 01, 1970  Transition of Care [facility]) CM/SW Contact:    Alexander Mt, LCSW Phone Number: 11/24/2019, 10:37 AM  Clinical Narrative:                 CSW spoke with pt at bedside. Introduced self, role, reason for visit. Pt from home with his mother, he states this is a recent arrangement over the past few weeks. He is independent with ambulation etc at baseline. He does not have a PCP and is okay with CSW making PCP appointment but is unsure still if he will f/u. CSW confirmed home address and that his mother can drive him if needed but he states she is often at work.   CSW explained that I was consulted in relation to pt substance use. Pt states he has been cutting back on his own and is down to a 40 oz beer every 1-2 days. Pt declines any additional resources, is aware CSW can bring if needed. Pt states he will likely need a bus pass to get home at d/c.   [facility] team will make appointment at [facility], will alert RNCM for [facility] if pt eligible and provide bus pass if still needed at d/c.  Expected Discharge Plan: Home/Self Care Barriers to Discharge: Continued Medical Work up   Patient Goals and CMS Choice Patient states their goals for this hospitalization and ongoing recovery are:: feel better CMS Medicare.gov Compare Post Acute Care list provided to:: (n/a) Choice offered to / list presented to : NA  Expected Discharge Plan and Services Expected Discharge Plan: Home/Self Care In-house Referral: Clinical Social Work Discharge Planning Services: CM Consult   Living arrangements for the past 2 months: Mobile Home                  Prior Living Arrangements/Services Living arrangements for the past 2 months: Mobile Home Lives with:: Parents Patient language and need for interpreter reviewed:: Yes(no needs) Do you feel safe going back to the place where you live?:  Yes      Need for Family Participation in Patient Care: No (Comment)(pt states he is relatively independent) Care giver support system in place?: Yes (comment)(pt mother)   Criminal Activity/Legal Involvement Pertinent to Current Situation/Hospitalization: No - Comment as needed  Permission Sought/Granted Permission sought to share information with : Family Supports, PCP Permission granted to share information with : Yes, Verbal Permission Granted  Share Information with NAME: Jasan Potempa  Permission granted to share info w AGENCY: [place] granted to share info w Relationship: mother  Permission granted to share info w Contact Information: [phone]  Emotional Assessment Appearance:: Appears stated age Attitude/Demeanor/Rapport: Lethargic Affect (typically observed): Flat Orientation: : Oriented to Self, Oriented to Place, Oriented to  Time, Oriented to Situation Alcohol / Substance Use: Alcohol Use Psych Involvement: (n/a)  Admission diagnosis:  Acute pancreatitis [K85.90] Hypokalemia [E87.6] Calculus of gallbladder without cholecystitis without obstruction [K80.20] Elevated bilirubin [R17] Abdominal pain [R10.9] Alcohol-induced acute pancreatitis, unspecified complication status [id number] Patient Active Problem List   Diagnosis Date Noted  . Acute pancreatitis 11/23/2019  . Alcoholic hepatitis 123456  . Gallstones 11/23/2019  . Alcohol abuse 11/23/2019   PCP:  Patient, No Pcp Per Pharmacy:   [facility] [address] ([address] [place] [id number] [place] ([place])  [address] Phone: [phone] Fax: [phone]  [id number] [address], [place] [address] Phone: [phone] Fax: [phone]  [id number] - [place], [place] [id number] [place]  [address] Phone: [phone] Fax: [phone]  [facility] [place], [place] - [address] [place] [address] Phone: [phone] Fax: [phone]  Readmission Risk Interventions Readmission Risk Prevention Plan 11/24/2019  Post Dischage Appt Complete  Medication Screening Complete  Transportation Screening Complete  Some recent data might be hidden

## 2019-11-25 LAB — GLUCOSE, CAPILLARY
Glucose-Capillary: 138 mg/dL — ABNORMAL HIGH (ref 70–99)
Glucose-Capillary: 143 mg/dL — ABNORMAL HIGH (ref 70–99)
Glucose-Capillary: 89 mg/dL (ref 70–99)

## 2019-11-25 LAB — MAGNESIUM: Magnesium: 1.6 mg/dL — ABNORMAL LOW (ref 1.7–2.4)

## 2019-11-25 LAB — COMPREHENSIVE METABOLIC PANEL
ALT: 25 U/L (ref 0–44)
AST: 74 U/L — ABNORMAL HIGH (ref 15–41)
Albumin: 2.1 g/dL — ABNORMAL LOW (ref 3.5–5.0)
Alkaline Phosphatase: 147 U/L — ABNORMAL HIGH (ref 38–126)
Anion gap: 12 (ref 5–15)
BUN: 5 mg/dL — ABNORMAL LOW (ref 6–20)
CO2: 22 mmol/L (ref 22–32)
Calcium: 8.1 mg/dL — ABNORMAL LOW (ref 8.9–10.3)
Chloride: 95 mmol/L — ABNORMAL LOW (ref 98–111)
Creatinine, Ser: 0.68 mg/dL (ref 0.61–1.24)
GFR calc Af Amer: >60 mL/min (ref >60)
GFR calc non Af Amer: >60 mL/min (ref >60)
Glucose, Bld: 121 mg/dL — ABNORMAL HIGH (ref 70–99)
Potassium: 2.7 mmol/L — CL (ref 3.5–5.1)
Sodium: 129 mmol/L — ABNORMAL LOW (ref 135–145)
Total Bilirubin: 35.3 mg/dL (ref 0.3–1.2)
Total Protein: 7 g/dL (ref 6.5–8.1)

## 2019-11-25 LAB — CBC
HCT: 26.1 % — ABNORMAL LOW (ref 39.0–52.0)
Hemoglobin: 9.4 g/dL — ABNORMAL LOW (ref 13.0–17.0)
MCH: 32.8 pg (ref 26.0–34.0)
MCHC: 36 g/dL (ref 30.0–36.0)
MCV: 90.9 fL (ref 80.0–100.0)
Platelets: 158 10*3/uL (ref 150–400)
RBC: 2.87 MIL/uL — ABNORMAL LOW (ref 4.22–5.81)
RDW: 18.3 % — ABNORMAL HIGH (ref 11.5–15.5)
WBC: 14.4 10*3/uL — ABNORMAL HIGH (ref 4.0–10.5)
nRBC: 0 % (ref 0.0–0.2)

## 2019-11-25 LAB — PROTIME-INR
INR: 1.5 — ABNORMAL HIGH (ref 0.8–1.2)
Prothrombin Time: 17.5 seconds — ABNORMAL HIGH (ref 11.4–15.2)

## 2019-11-25 MED ORDER — POTASSIUM CHLORIDE 10 MEQ/100ML IV SOLN
10.0000 meq | INTRAVENOUS | Status: AC
  Administered 2019-11-25 (×4): 10 meq via INTRAVENOUS
  Filled 2019-11-25 (×3): qty 100

## 2019-11-25 MED ORDER — MAGNESIUM SULFATE 4 GM/100ML IV SOLN
4.0000 g | Freq: Once | INTRAVENOUS | Status: AC
  Administered 2019-11-25: 4 g via INTRAVENOUS
  Filled 2019-11-25: qty 100

## 2019-11-25 MED ORDER — POTASSIUM CHLORIDE 10 MEQ/100ML IV SOLN
10.0000 meq | INTRAVENOUS | Status: AC
  Administered 2019-11-25 (×4): 10 meq via INTRAVENOUS
  Filled 2019-11-25 (×5): qty 100

## 2019-11-25 MED ORDER — LACTATED RINGERS IV SOLN: INTRAVENOUS | Status: DC

## 2019-11-25 NOTE — Plan of Care (Signed)
  Problem: Education: Goal: Knowledge of General Education information will improve Description Including pain rating scale, medication(s)/side effects and non-pharmacologic comfort measures Outcome: Progressing   

## 2019-11-25 NOTE — Progress Notes (Signed)
CC:  Abdominal pain, nausea and vomiting; hematuria x 2 months  Subjective: His mother is with him this AM.  NO complaints of pain, nausea or vomting. Tolerating clears well.  Objective: Vital signs in last 24 hours: Temp:  [97.9 F (36.6 C)-98.7 F (37.1 C)] 98.3 F (36.8 C) (05/20 0544) Pulse Rate:  [92-110] 92 (05/20 0544) Resp:  [17-18] 18 (05/20 0544) BP: (103-127)/(70-85) 103/73 (05/20 0544) SpO2:  [97 %-100 %] 99 % (05/20 0544) Last BM Date: 11/21/19 1380 PO 1050 IV 750 urine Afebrile, VSS Na 129 K+ 2.7 Mag 1.6 Alk phos 147 ALT 74/AST 25 T Bil 35.3 WBC 14.4 MRI:  4 cm ill-defined infiltrating pancreatic body lesion worrisome for infiltrating neoplasm/adenocarcinoma; marked atrophy of the pancreatic tail and dilatation of the main pancreatic duct. Ill-defined area of decreased perfusion in the pancreatic head could reflect focal pancreatitis but could not exclude a small lesion measuring 14 mm in the pancreatic head also. Inflammation in and around the pancreatic head consistent with acute pancreatitis Cirrhosis with innumerable regenerating nodules throughout the liver but no early arterial phase enhancing hepatic lesions to suggest dysplastic nodules or hepatocellular cancer. Portal venous hypertension with portal venous collaterals but no splenomegaly or ascites. Intake/Output from previous day: 05/19 0701 - 05/20 0700 In: 2430 [P.O.:1380; I.V.:1000; IV Piggyback:50] Out: 750 [Urine:750] Intake/Output this shift: No intake/output data recorded.  General appearance: alert, cooperative and no distress Resp: clear to auscultation bilaterally GI: soft non tender, + flatus, no abdominal pain.  Lab Results:  Recent Labs    11/24/19 0832 11/25/19 0429  WBC 19.6* 14.4*  HGB 10.0* 9.4*  HCT 27.5* 26.1*  PLT 159 158    BMET Recent Labs    11/24/19 0832 11/25/19 0429  NA 128* 129*  K 3.2* 2.7*  CL 91* 95*  CO2 20* 22  GLUCOSE 105* 121*  BUN 7 5*   CREATININE 0.92 0.68  CALCIUM 8.2* 8.1*   PT/INR Recent Labs    11/24/19 0009 11/25/19 0429  LABPROT 16.4* 17.5*  INR 1.4* 1.5*    Recent Labs  Lab 11/23/19 1354 11/24/19 0832 11/25/19 0429  AST 132* 93* 74*  ALT 35 35 25  ALKPHOS 190* 158* 147*  BILITOT 34.8* 36.9* 35.3*  PROT 8.8* 8.1 7.0  ALBUMIN 2.8* 2.4* 2.1*     Lipase     Component Value Date/Time   LIPASE 284 (H) 11/23/2019 1354     Medications: . folic acid  1 mg Oral Daily  . levETIRAcetam  500 mg Oral BID  . LORazepam  0-4 mg Intravenous Q6H   Followed by  . [START ON 11/26/2019] LORazepam  0-4 mg Intravenous Q12H  . multivitamin with minerals  1 tablet Oral Daily  . pantoprazole  40 mg Oral Daily  . phenytoin  300 mg Oral Daily  . phytonadione  10 mg Oral Daily  . thiamine  100 mg Oral Daily   Or  . thiamine  100 mg Intravenous Daily    Assessment/Plan Hyponatremia Hypokalemia - K+3.2>>2.7 - being replaced Hypomagnesemia  - 1.6 Hx tobacco use 30 yrs <1PPD    Abdominal pain, nausea and vomiting Cholelithiasis/Acute pancreatitis 4 cm pancreatic body lesion - possible neoplasm/adenocarcinoma Cirrhosis/portal venous hypertension- no splenomegaly/ascites  - CBD 4 mm/GB wall thickening/pericholecystic fluid Possible cirrhosis with recanalization of umbilical vein/? portal hypertension Pancreatitis  - lipase 284/CT: Enlargement of the pancreatic head is seen with       peripancreatic soft tissue swelling, consistent with acute  pancreatitis. No peripancreatic fluid collections are seen Hyperbilirubinemia -total bilirubin 34.8 Hx alcohol abuse/drug use Hx alcohol related seizures  - he reports last seizure about 3 mo ago/ED record 05/27/19 - RX for Keppra    and Dilantin  - WBC 17.9>>19.6>>14.4 Hx left nephrectomy - creatinine 0.86>>0.92>>0.68  - GI planning EUS/biopsy  FEN: IV fluids/n.p.o. ID: Rocephin x1 dose 5/18; Zosyn 5/18 >> day 2 DVT: SCDs/INR 1.4>>1.5  Plan:  GI work up in  progress.  We will follow. If this is a cancer will eventually need to see DR. Byerly as an outpatient.        LOS: 2 days    Gerlad Pelzel 11/25/2019 Please see Amion

## 2019-11-25 NOTE — Progress Notes (Signed)
Gary Mitchell  [id number] DOB: [birth year] DOA: 11/23/2019 PCP: Patient, No Pcp Per    Brief Narrative:  49 year old with a history of alcoholism and alcohol-related seizures who presented to the ED with abdominal pain nausea and vomiting x5 days. In the ED CT abdomen and pelvis noted findings consistent with pancreatitis as well as probable portal hypertension and cholelithiasis.  Significant Events: 5/18 admit via [facility]  Antimicrobials:  Rocephin 5/18 Zosyn 5/18 >  Subjective: Awake alert and interactive today.  Reports that his abdominal pain has essentially resolved.  Is tolerating intake without difficulty at this time.  Denies chest pain or shortness of breath.  Reports that he has no questions.  Assessment & Plan:  Acute alcohol-related pancreatitis continue to counsel alcohol abstinence -clinically much improved -agree with advancing diet as per GI plan  Alcoholic hepatitis Discriminant score 53 -GI does not feel steroids are safe at present  Cirrhosis of the liver  Due to heavy alcohol abuse -other factors being ruled out with serologic work-up  Hyponatremia Due to alcoholism and cirrhosis -sodium is stable  Severe hypokalemia Continue to supplement -likely has significant total body deficit related to malnutrition of alcoholism  Hypomagnesemia Supplement to goal of 2.0  Abnormal lesion in body of pancreas Will need eventual biopsy by EUS -GI now feels it is most appropriate to do this in the outpatient setting when the patient is given more time to recover from his acute pancreatitis and alcoholic hepatitis  Cholelithiasis Not believed to be a significant contributor to his current acute illness  Alcoholism  Anemia Due to above -no evidence of significant acute blood loss -follow trend  Alcohol related seizures  DVT prophylaxis: SCDs Code Status: FULL CODE Family Communication:  Status is: Inpatient  Remains inpatient appropriate because:Ongoing  diagnostic testing needed not appropriate for outpatient work up   Dispo: The patient is from: Home              Anticipated d/c is to: Home              Anticipated d/c date is: 1-2 days               Patient currently is not medically stable to d/c.  Consultants:  General surgery [place] [facility]  Objective: Blood pressure 103/73, pulse 92, temperature 98.3 F (36.8 C), temperature source Oral, resp. rate 18, weight 63.1 kg, SpO2 99 %.  Intake/Output Summary (Last 24 hours) at 11/25/2019 0919 Last data filed at 11/25/2019 0700 Gross per 24 hour  Intake 2532.19 ml  Output 750 ml  Net 1782.19 ml   Filed Weights   11/24/19 0500  Weight: 63.1 kg    Examination: General: No acute respiratory distress Lungs: CTA B w/o wheezing  Cardiovascular: RRR w/o M Abdomen: NT/ND, soft, bs+, no mass Extremities: No C/C/E B LE   CBC: Recent Labs  Lab 11/23/19 1354 11/24/19 0832 11/25/19 0429  WBC 17.9* 19.6* 14.4*  NEUTROABS  --  16.1*  --   HGB 11.1* 10.0* 9.4*  HCT 31.7* 27.5* 26.1*  MCV 92.4 90.5 90.9  PLT 190 159 0000000   Basic Metabolic Panel: Recent Labs  Lab 11/23/19 1354 11/24/19 0832 11/25/19 0429  NA 128* 128* 129*  K 2.6* 3.2* 2.7*  CL 89* 91* 95*  CO2 17* 20* 22  GLUCOSE 108* 105* 121*  BUN 8 7 5*  CREATININE 0.86 0.92 0.68  CALCIUM 9.0 8.2* 8.1*  MG  --   --  1.6*  GFR: CrCl cannot be calculated (Unknown ideal weight.).  Liver Function Tests: Recent Labs  Lab 11/23/19 1354 11/24/19 0832 11/25/19 0429  AST 132* 93* 74*  ALT 35 35 25  ALKPHOS 190* 158* 147*  BILITOT 34.8* 36.9* 35.3*  PROT 8.8* 8.1 7.0  ALBUMIN 2.8* 2.4* 2.1*   Recent Labs  Lab 11/23/19 1354  LIPASE 284*    Coagulation Profile: Recent Labs  Lab 11/24/19 0009 11/25/19 0429  INR 1.4* 1.5*    CBG: Recent Labs  Lab 11/23/19 2352 11/24/19 0750 11/24/19 1602 11/25/19 0017 11/25/19 0752  GLUCAP 77 113* 108* 89 143*    Recent Results (from the past 240  hour(s))  SARS Coronavirus 2 by RT PCR (hospital order, performed in [facility] hospital lab) Nasopharyngeal Nasopharyngeal Swab     Status: None   Collection Time: 11/23/19 10:47 PM   Specimen: Nasopharyngeal Swab  Result Value Ref Range Status   SARS Coronavirus 2 NEGATIVE NEGATIVE Final    Comment: (NOTE) SARS-CoV-2 target nucleic acids are NOT DETECTED. The SARS-CoV-2 RNA is generally detectable in upper and lower respiratory specimens during the acute phase of infection. The lowest concentration of SARS-CoV-2 viral copies this assay can detect is 250 copies / mL. A negative result does not preclude SARS-CoV-2 infection and should not be used as the sole basis for treatment or other patient management decisions.  A negative result may occur with improper specimen collection / handling, submission of specimen other than nasopharyngeal swab, presence of viral mutation(s) within the areas targeted by this assay, and inadequate number of viral copies (<250 copies / mL). A negative result must be combined with clinical observations, patient history, and epidemiological information. Fact Sheet for Patients:   StrictlyIdeas.no Fact Sheet for Healthcare Providers: BankingDealers.co.za This test is not yet approved or cleared  by the [place] FDA and has been authorized for detection and/or diagnosis of SARS-CoV-2 by FDA under an Emergency Use Authorization (EUA).  This EUA will remain in effect (meaning this test can be used) for the duration of the COVID-19 declaration under Section 564(b)(1) of the Act, 21 U.S.C. section 360bbb-3(b)(1), unless the authorization is terminated or revoked sooner. Performed at [facility], [place] [address] [place], [place] [address]      Scheduled Meds: . folic acid  1 mg Oral Daily  . levETIRAcetam  500 mg Oral BID  . LORazepam  0-4 mg Intravenous Q6H   Followed by  . [START ON 11/26/2019]  LORazepam  0-4 mg Intravenous Q12H  . multivitamin with minerals  1 tablet Oral Daily  . pantoprazole  40 mg Oral Daily  . phenytoin  300 mg Oral Daily  . phytonadione  10 mg Oral Daily  . thiamine  100 mg Oral Daily   Or  . thiamine  100 mg Intravenous Daily   Continuous Infusions: . lactated ringers    . piperacillin-tazobactam 3.375 g (11/25/19 0438)  . potassium chloride 10 mEq (11/25/19 0658)     LOS: 2 days   Cherene Altes, MD [facility] Office  [phone] Pager - Text Page per Amion  If 7PM-7AM, please contact night-coverage per Amion 11/25/2019, 9:19 AM

## 2019-11-25 NOTE — Progress Notes (Addendum)
Daily Rounding Note  11/25/2019, 11:39 AM  LOS: 2 days   SUBJECTIVE:   Chief complaint: Acute pancreatitis.  Mass in body of pancreas and associated adenopathy, suspicious for neoplasm Abdominal pain is gone.  Has not required any pain meds today.  No nausea, tolerating clear liquids.  Feels "100% better".  OBJECTIVE:         Vital signs in last 24 hours:    Temp:  [98.2 F (36.8 C)-98.7 F (37.1 C)] 98.3 F (36.8 C) (05/20 0544) Pulse Rate:  [92-102] 92 (05/20 0544) Resp:  [17-18] 18 (05/20 0544) BP: (103-110)/(70-74) 103/73 (05/20 0544) SpO2:  [97 %-100 %] 99 % (05/20 0544) Last BM Date: 11/21/19 Filed Weights   11/24/19 0500  Weight: 63.1 kg   General: Seems depressed.  Alert.  Comfortable. Heart: RRR Chest: Clear bilaterally. Abdomen: Soft, nontender, active bowel sounds. Extremities: No CCE. Neuro/Psych: Alert.  Appropriate.  No tremors, no asterixis.  Flat affect, seems sad.  Intake/Output from previous day: 05/19 0701 - 05/20 0700 In: 2532.2 [P.O.:1380; I.V.:1000; IV Piggyback:152.2] Out: 750 [Urine:750]  Intake/Output this shift: No intake/output data recorded.  Lab Results: Recent Labs    11/23/19 1354 11/24/19 0832 11/25/19 0429  WBC 17.9* 19.6* 14.4*  HGB 11.1* 10.0* 9.4*  HCT 31.7* 27.5* 26.1*  PLT 190 159 158   BMET Recent Labs    11/23/19 1354 11/24/19 0832 11/25/19 0429  NA 128* 128* 129*  K 2.6* 3.2* 2.7*  CL 89* 91* 95*  CO2 17* 20* 22  GLUCOSE 108* 105* 121*  BUN 8 7 5*  CREATININE 0.86 0.92 0.68  CALCIUM 9.0 8.2* 8.1*   LFT Recent Labs    11/23/19 1354 11/24/19 0832 11/25/19 0429  PROT 8.8* 8.1 7.0  ALBUMIN 2.8* 2.4* 2.1*  AST 132* 93* 74*  ALT 35 35 25  ALKPHOS 190* 158* 147*  BILITOT 34.8* 36.9* 35.3*  BILIDIR  --  22.6*  --   IBILI  --  14.3*  --    PT/INR Recent Labs    11/24/19 0009 11/25/19 0429  LABPROT 16.4* 17.5*  INR 1.4* 1.5*   Hepatitis  Panel Recent Labs    11/24/19 0009  HEPBSAG NON REACTIVE  HCVAB NON REACTIVE  HEPAIGM NON REACTIVE  HEPBIGM NON REACTIVE    Studies/Results: CT ABDOMEN PELVIS WO CONTRAST  Result Date: 11/23/2019 CLINICAL DATA:  Abdominal pain and nausea and vomiting for 2 months. Hematuria. EXAM: CT ABDOMEN AND PELVIS WITHOUT CONTRAST TECHNIQUE: Multidetector CT imaging of the abdomen and pelvis was performed following the standard protocol without IV contrast. COMPARISON:  None. FINDINGS: Lower chest: No acute findings. Hepatobiliary: Mild hepatomegaly and moderate diffuse hepatic steatosis are noted. No mass visualized on this unenhanced exam. Recanalization of paraumbilical veins is seen and suspicious for portal venous hypertension. Gallbladder is unremarkable. No evidence of biliary ductal dilatation. Pancreas: Enlargement of the pancreatic head is seen with peripancreatic soft tissue swelling, consistent with acute pancreatitis. No peripancreatic fluid collections are seen. Spleen:  Within normal limits in size. Adrenals/Urinary tract: Normal adrenal glands. Absent left kidney. No evidence of right renal or ureteral calculi, or hydronephrosis. Unremarkable unopacified urinary bladder. Stomach/Bowel: No evidence of obstruction, inflammatory process, or abnormal fluid collections. Vascular/Lymphatic: No pathologically enlarged lymph nodes identified. No evidence of abdominal aortic aneurysm. Aortic atherosclerosis noted. Reproductive:  No mass or other significant abnormality. Other:  None. Musculoskeletal:  No suspicious bone lesions identified. IMPRESSION: 1. Acute focal pancreatitis involving  the pancreatic head. Recommend continued follow-up by contrast enhanced CT to confirm resolution and exclude underlying neoplasm. 2. Hepatic steatosis and hepatomegaly. 3. Recanalization of paraumbilical veins is suspicious for portal venous hypertension due to underlying cirrhosis. 4. Absent left kidney. No evidence of  right renal calculi or hydronephrosis. Aortic Atherosclerosis (ICD10-I70.0). Electronically Signed   By: Marlaine Hind M.D.   On: 11/23/2019 18:53   DG Chest 2 View  Result Date: 11/23/2019 CLINICAL DATA:  Preop EXAM: CHEST - 2 VIEW COMPARISON:  05/21/2016 FINDINGS: The heart size and mediastinal contours are within normal limits. Both lungs are clear. The visualized skeletal structures are unremarkable. IMPRESSION: No active cardiopulmonary disease. Electronically Signed   By: Rolm Baptise M.D.   On: 11/23/2019 20:25   MR ABDOMEN MRCP W WO CONTAST  Result Date: 11/24/2019 CLINICAL DATA:  Abdominal pain. Pancreatitis. Alcoholic hepatitis. EXAM: MRI ABDOMEN WITHOUT AND WITH CONTRAST (INCLUDING MRCP) TECHNIQUE: Multiplanar multisequence MR imaging of the abdomen was performed both before and after the administration of intravenous contrast. Heavily T2-weighted images of the biliary and pancreatic ducts were obtained, and three-dimensional MRCP images were rendered by post processing. CONTRAST:  79mL GADAVIST GADOBUTROL 1 MMOL/ML IV SOLN COMPARISON:  CT scan 11/21/2019 FINDINGS: Lower chest: The lung bases are grossly clear. No pleural or pericardial effusion. Hepatobiliary: Hepatomegaly is demonstrated along with geographic fatty infiltration. There are cirrhotic changes involving the liver with abnormal liver contour and innumerable small regenerating nodules throughout the liver. There are also changes of portal venous hypertension with portal venous collaterals. I do not see any early arterial phase enhancing hepatic lesions to suggest dysplastic nodules or HCC. Small layering gallstones are noted the gallbladder. No common bile duct dilatation. Pancreas: Inflammatory changes noted in and around the head of the pancreas consistent with acute pancreatitis. Ill-defined area of decreased perfusion in the pancreatic head could reflect an area of focal pancreatitis. I do not see an obvious lesion on the T2  weighted images but could not exclude a small lesion. There is an ill-defined infiltrating lesion in the pancreatic body worrisome for infiltrating neoplasm. This measures approximately 4 cm and there is associated fairly abrupt cut off of the main pancreatic duct along with dilatation and associated fairly marked atrophy of the pancreatic tail. This all appeared normal on a prior CT scan from 2016. Spleen:  Normal size. No focal lesions. Adrenals/Urinary Tract:  The adrenal glands are unremarkable. The left kidney is surgically absent. The right kidney demonstrates compensatory hypertrophy. No renal lesions or hydronephrosis. Stomach/Bowel: The stomach is unremarkable. There is moderate inflammation involving the second and third portions of the duodenum due to the acute pancreatitis. The visualized small bowel and colon are otherwise unremarkable. Vascular/Lymphatic: The aorta and branch vessels are patent. The portal and splenic veins are patent. Portal venous collateral vasculature is noted. There are enlarged peripancreatic and celiac axis lymph nodes worrisome for metastatic adenopathy. Other:  No ascites or abdominal hernia. Musculoskeletal: No significant bony findings. IMPRESSION: 1. 4 cm ill-defined infiltrating pancreatic body lesion worrisome for infiltrating neoplasm/adenocarcinoma. This is associated with marked atrophy of the pancreatic tail and dilatation of the main pancreatic duct. Recommend EUS and biopsy. 2. Ill-defined area of decreased perfusion in the pancreatic head could reflect focal pancreatitis but could not exclude a small lesion measuring 14 mm in the pancreatic head also. 3. Inflammation in and around the pancreatic head consistent with acute pancreatitis which could be gallstone related. 4. Cirrhosis with innumerable regenerating nodules throughout the liver  but no early arterial phase enhancing hepatic lesions to suggest dysplastic nodules or HCC. Fatty infiltration is also noted. 5.  Portal venous hypertension with portal venous collaterals but no splenomegaly or ascites. 6. Cholelithiasis. 7. Enlarged peripancreatic and celiac axis lymph nodes worrisome for metastatic adenopathy. Electronically Signed   By: Marijo Sanes M.D.   On: 11/24/2019 06:18   US Abdomen Limited RUQ  Result Date: 11/23/2019 CLINICAL DATA:  Abdominal pain EXAM: ULTRASOUND ABDOMEN LIMITED RIGHT UPPER QUADRANT COMPARISON:  None. FINDINGS: Gallbladder: Multiple layering stones and sludge within the gallbladder. Gallbladder wall is thickened at 4 mm. Small amount of pericholecystic fluid. Common bile duct: Diameter: 5 mm in diameter Liver: Heterogeneous, increased echotexture with nodular contours suggesting cirrhosis. Portal vein is patent on color Doppler imaging with normal direction of blood flow towards the liver. Recanalized umbilical vein. Other: None. IMPRESSION: Increased echotexture throughout the liver with nodular contour suggesting cirrhosis. Recanalized umbilical vein compatible with portal venous hypertension. Stones and sludge noted within gallbladder with gallbladder wall thickening and a small amount of pericholecystic fluid. Cannot exclude acute cholecystitis. Electronically Signed   By: Rolm Baptise M.D.   On: 11/23/2019 18:01    ASSESMENT:   *    Acute pancreatitis at head of pancreas.  Ill-defined lesion in pancreatic body along with peripancreatic and celiac axis lymphadenopathy allworrisome for neoplasm  .  *    Alcoholic hepatitis.  Alcoholic cirrhosis of the liver. Disc Fx score 53.    Prednisolone not initiated due to multiple unanswered questions and potential risks. LFTs improving but the bili is still significantly elevated at 35.3.  Acute hepatitis panel nonreactive.  Alpha-1 antitrypsin, ceruloplasmin, IgG, smooth muscle and mitochondrial antibodies all pending.  *     Colelithiasis.  Surgery has not pursued HIDA scan. Day 3 Zosyn.  Not convinced patient has cholecystitis  *     Normocytic anemia.   PLAN   *   Plans for EUS, FNA on hold, likely will be deferred to outpt setting.    *     Advance to heart healthy/low-fat diet.  *    Does she still need to Zosyn?   Azucena Freed  11/25/2019, 11:39 AM Phone [phone]  ________________________________________________________________________  Velora Heckler GI MD note:  I personally examined the patient, reviewed the data and agree with the assessment and plan described above.  Hard to know how much of his LFT picture is from severe alcoholic hepatitis (TB 35, INR 1.5) or just underlying cirrhosis but since his Tbili was1.3 about 1 1/2 years ago and his AST/ALT is typical >>2/1 ratio I think most is from Alcoholic hepatitis.  Today he told me that in addition to daily beer he also consumes about a fifth of liquor every 2-3 days.  Probably his acute pancreatis was alcohol related as well.    He needs time to recover from this severe AH and mild pancreatitis before I think it would be wise to investigate the pancreatic lesion further.  I will therefore look at EUS FNA in 3-4 weeks from now as an outpatient.   For now, he needs to ambulate, eat well.  I am a bit nervous about starting steroids for the severe alc hep.  I am going to stop his antibiotic for now. See how he does without them as they were started 'empirically' by ?  Owens Loffler, MD [facility] Pager [phone]

## 2019-11-26 LAB — COMPREHENSIVE METABOLIC PANEL
ALT: 29 U/L (ref 0–44)
AST: 76 U/L — ABNORMAL HIGH (ref 15–41)
Albumin: 2 g/dL — ABNORMAL LOW (ref 3.5–5.0)
Alkaline Phosphatase: 135 U/L — ABNORMAL HIGH (ref 38–126)
Anion gap: 10 (ref 5–15)
BUN: <5 mg/dL — ABNORMAL LOW (ref 6–20)
CO2: 20 mmol/L — ABNORMAL LOW (ref 22–32)
Calcium: 8.3 mg/dL — ABNORMAL LOW (ref 8.9–10.3)
Chloride: 101 mmol/L (ref 98–111)
Creatinine, Ser: 0.7 mg/dL (ref 0.61–1.24)
GFR calc Af Amer: >60 mL/min (ref >60)
GFR calc non Af Amer: >60 mL/min (ref >60)
Glucose, Bld: 121 mg/dL — ABNORMAL HIGH (ref 70–99)
Potassium: 3.2 mmol/L — ABNORMAL LOW (ref 3.5–5.1)
Sodium: 131 mmol/L — ABNORMAL LOW (ref 135–145)
Total Bilirubin: 38 mg/dL (ref 0.3–1.2)
Total Protein: 7 g/dL (ref 6.5–8.1)

## 2019-11-26 LAB — GLUCOSE, CAPILLARY
Glucose-Capillary: 122 mg/dL — ABNORMAL HIGH (ref 70–99)
Glucose-Capillary: 173 mg/dL — ABNORMAL HIGH (ref 70–99)
Glucose-Capillary: 190 mg/dL — ABNORMAL HIGH (ref 70–99)
Glucose-Capillary: 260 mg/dL — ABNORMAL HIGH (ref 70–99)

## 2019-11-26 LAB — CBC
HCT: 25.1 % — ABNORMAL LOW (ref 39.0–52.0)
Hemoglobin: 8.9 g/dL — ABNORMAL LOW (ref 13.0–17.0)
MCH: 32.2 pg (ref 26.0–34.0)
MCHC: 35.5 g/dL (ref 30.0–36.0)
MCV: 90.9 fL (ref 80.0–100.0)
Platelets: 176 10*3/uL (ref 150–400)
RBC: 2.76 MIL/uL — ABNORMAL LOW (ref 4.22–5.81)
RDW: 18.5 % — ABNORMAL HIGH (ref 11.5–15.5)
WBC: 12.5 10*3/uL — ABNORMAL HIGH (ref 4.0–10.5)
nRBC: 0 % (ref 0.0–0.2)

## 2019-11-26 LAB — PROTIME-INR
INR: 1.6 — ABNORMAL HIGH (ref 0.8–1.2)
Prothrombin Time: 18.1 seconds — ABNORMAL HIGH (ref 11.4–15.2)

## 2019-11-26 LAB — IGG: IgG (Immunoglobin G), Serum: 1719 mg/dL — ABNORMAL HIGH (ref 603–1613)

## 2019-11-26 LAB — CERULOPLASMIN: Ceruloplasmin: 30.9 mg/dL (ref 16.0–31.0)

## 2019-11-26 LAB — ALPHA-1-ANTITRYPSIN: A-1 Antitrypsin, Ser: 174 mg/dL (ref 101–187)

## 2019-11-26 LAB — ANTINUCLEAR ANTIBODIES, IFA: ANA Ab, IFA: NEGATIVE

## 2019-11-26 MED ORDER — PREDNISOLONE 5 MG PO TABS
40.0000 mg | ORAL_TABLET | Freq: Every day | ORAL | Status: AC
  Administered 2019-11-26 – 2019-11-29 (×4): 40 mg via ORAL
  Filled 2019-11-26 (×4): qty 8

## 2019-11-26 NOTE — Progress Notes (Signed)
[facility] Progress Note    Since last GI note: Eating well. Slow mentation this morning, close to baseline probably.  No asterixis  Objective: Vital signs in last 24 hours: Temp:  [98 F (36.7 C)-98.5 F (36.9 C)] 98.5 F (36.9 C) (05/21 0439) Pulse Rate:  [93-99] 99 (05/21 0439) Resp:  [15-17] 15 (05/21 0439) BP: (102-111)/(70-76) 111/71 (05/21 0439) SpO2:  [99 %-100 %] 100 % (05/21 0439) Last BM Date: 11/21/19 General: alert and oriented times 3 Heart: regular rate and rythm Abdomen: soft, non-tender, non-distended, normal bowel sounds   Lab Results: Recent Labs    11/24/19 0832 11/25/19 0429 11/26/19 0119  WBC 19.6* 14.4* 12.5*  HGB 10.0* 9.4* 8.9*  PLT 159 158 176  MCV 90.5 90.9 90.9   Recent Labs    11/24/19 0832 11/25/19 0429 11/26/19 0119  NA 128* 129* 131*  K 3.2* 2.7* 3.2*  CL 91* 95* 101  CO2 20* 22 20*  GLUCOSE 105* 121* 121*  BUN 7 5* <5*  CREATININE 0.92 0.68 0.70  CALCIUM 8.2* 8.1* 8.3*   Recent Labs    11/24/19 0832 11/25/19 0429 11/26/19 0119  PROT 8.1 7.0 7.0  ALBUMIN 2.4* 2.1* 2.0*  AST 93* 74* 76*  ALT 35 25 29  ALKPHOS 158* 147* 135*  BILITOT 36.9* 35.3* 38.0*  BILIDIR 22.6*  --   --   IBILI 14.3*  --   --    Recent Labs    11/25/19 0429 11/26/19 0119  INR 1.5* 1.6*    Medications: Scheduled Meds: . folic acid  1 mg Oral Daily  . levETIRAcetam  500 mg Oral BID  . LORazepam  0-4 mg Intravenous Q12H  . multivitamin with minerals  1 tablet Oral Daily  . pantoprazole  40 mg Oral Daily  . phenytoin  300 mg Oral Daily  . phytonadione  10 mg Oral Daily  . thiamine  100 mg Oral Daily   Continuous Infusions: . lactated ringers Stopped (11/25/19 1930)   PRN Meds:.LORazepam **OR** LORazepam, ondansetron **OR** ondansetron (ZOFRAN) IV    Assessment/Plan: 49 y.o. 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  Viral hepatitis panel negative.  No biliary obstruction on MR, CT, [place].  Autoimmune workup still pending.  T bili and INR slowly climbing.  Hopefully these will start to improve.  I am going to start prednisolone (40mg  daily for 30 day course)  today as we have no evidence of underlying infection.    No plans for pancreatic biopsy until his severe alc hepatitis begins to improve.  [facility] will return to see him on Monday. I am available all weekend.  Gary Banister, MD  11/26/2019, 8:31 AM [facility] Pager [phone]

## 2019-11-26 NOTE — Progress Notes (Signed)
The previous note was an error, wrong chart.

## 2019-11-26 NOTE — Plan of Care (Signed)
  Problem: Education: Goal: Knowledge of General Education information will improve Description Including pain rating scale, medication(s)/side effects and non-pharmacologic comfort measures Outcome: Progressing   

## 2019-11-26 NOTE — Progress Notes (Signed)
Gary Mitchell  [id number] DOB: 1971-07-01 DOA: 11/23/2019 PCP: Patient, No Pcp Per    Brief Narrative:  49 year old with a history of alcoholism and alcohol-related seizures who presented to the ED with abdominal pain nausea and vomiting x5 days. In the ED CT abdomen and pelvis noted findings consistent with pancreatitis as well as probable portal hypertension and cholelithiasis.  Significant Events: 5/18 admit via [place]  Antimicrobials:  Rocephin 5/18 Zosyn 5/18 > 5/20  Subjective: Reports that he is feeling much better today.  States he intends to fully abstain from alcohol.  Denies chest pain nausea vomiting or abdominal pain.  Is anxious to go home as soon as possible.  Assessment & Plan:  Acute alcohol-related pancreatitis continue to counsel alcohol abstinence -clinically much improved -tolerating advance diet thus far  Alcoholic hepatitis Discriminant score 53 -steroids carefully initiated today -monitor trend  Cirrhosis of the liver  Due to heavy alcohol abuse -other factors being ruled out with serologic work-up  Hyponatremia Due to alcoholism and cirrhosis -sodium is slowly improving  Severe hypokalemia Continue to supplement -likely has significant total body deficit related to malnutrition of alcoholism -recheck in a.m.  Hypomagnesemia Supplement to goal of 2.0  Abnormal lesion in body of pancreas Will need eventual biopsy by EUS -GI now feels it is most appropriate to do this in the outpatient setting when the patient is given more time to recover from his acute pancreatitis and alcoholic hepatitis  Cholelithiasis Not believed to be a significant contributor to his current acute illness  Alcoholism  Anemia Due to above -no evidence of significant acute blood loss -follow trend  Alcohol related seizures  DVT prophylaxis: SCDs Code Status: FULL CODE Family Communication:  Status is: Inpatient  Remains inpatient appropriate because:Ongoing  diagnostic testing needed not appropriate for outpatient work up   Dispo: The patient is from: Home              Anticipated d/c is to: Home              Anticipated d/c date is: 1-2 days               Patient currently is not medically stable to d/c.  Consultants:  General surgery [place] [facility]  Objective: Blood pressure 111/71, pulse 99, temperature 98.5 F (36.9 C), temperature source Oral, resp. rate 15, weight 63.1 kg, SpO2 100 %.  Intake/Output Summary (Last 24 hours) at 11/26/2019 0842 Last data filed at 11/26/2019 0446 Gross per 24 hour  Intake 1468.78 ml  Output --  Net 1468.78 ml   Filed Weights   11/24/19 0500  Weight: 63.1 kg    Examination: General: No acute respiratory distress Lungs: CTA B  Cardiovascular: RRR w/o M Abdomen: NT/ND, soft, bs+, no mass Extremities: No C/C/E bilateral lower extremities  CBC: Recent Labs  Lab 11/24/19 0832 11/25/19 0429 11/26/19 0119  WBC 19.6* 14.4* 12.5*  NEUTROABS 16.1*  --   --   HGB 10.0* 9.4* 8.9*  HCT 27.5* 26.1* 25.1*  MCV 90.5 90.9 90.9  PLT 159 158 0000000   Basic Metabolic Panel: Recent Labs  Lab 11/24/19 0832 11/25/19 0429 11/26/19 0119  NA 128* 129* 131*  K 3.2* 2.7* 3.2*  CL 91* 95* 101  CO2 20* 22 20*  GLUCOSE 105* 121* 121*  BUN 7 5* <5*  CREATININE 0.92 0.68 0.70  CALCIUM 8.2* 8.1* 8.3*  MG  --  1.6*  --    GFR: CrCl cannot be calculated (  Unknown ideal weight.).  Liver Function Tests: Recent Labs  Lab 11/23/19 1354 11/24/19 0832 11/25/19 0429 11/26/19 0119  AST 132* 93* 74* 76*  ALT 35 35 25 29  ALKPHOS 190* 158* 147* 135*  BILITOT 34.8* 36.9* 35.3* 38.0*  PROT 8.8* 8.1 7.0 7.0  ALBUMIN 2.8* 2.4* 2.1* 2.0*   Recent Labs  Lab 11/23/19 1354  LIPASE 284*    Coagulation Profile: Recent Labs  Lab 11/24/19 0009 11/25/19 0429 11/26/19 0119  INR 1.4* 1.5* 1.6*    CBG: Recent Labs  Lab 11/25/19 0017 11/25/19 0752 11/25/19 1704 11/26/19 0003 11/26/19 0806   GLUCAP 89 143* 138* 122* 190*    Recent Results (from the past 240 hour(s))  SARS Coronavirus 2 by RT PCR (hospital order, performed in [facility] hospital lab) Nasopharyngeal Nasopharyngeal Swab     Status: None   Collection Time: 11/23/19 10:47 PM   Specimen: Nasopharyngeal Swab  Result Value Ref Range Status   SARS Coronavirus 2 NEGATIVE NEGATIVE Final    Comment: (NOTE) SARS-CoV-2 target nucleic acids are NOT DETECTED. The SARS-CoV-2 RNA is generally detectable in upper and lower respiratory specimens during the acute phase of infection. The lowest concentration of SARS-CoV-2 viral copies this assay can detect is 250 copies / mL. A negative result does not preclude SARS-CoV-2 infection and should not be used as the sole basis for treatment or other patient management decisions.  A negative result may occur with improper specimen collection / handling, submission of specimen other than nasopharyngeal swab, presence of viral mutation(s) within the areas targeted by this assay, and inadequate number of viral copies (<250 copies / mL). A negative result must be combined with clinical observations, patient history, and epidemiological information. Fact Sheet for Patients:   StrictlyIdeas.no Fact Sheet for Healthcare Providers: BankingDealers.co.za This test is not yet approved or cleared  by the [place] FDA and has been authorized for detection and/or diagnosis of SARS-CoV-2 by FDA under an Emergency Use Authorization (EUA).  This EUA will remain in effect (meaning this test can be used) for the duration of the COVID-19 declaration under Section 564(b)(1) of the Act, 21 U.S.C. section 360bbb-3(b)(1), unless the authorization is terminated or revoked sooner. Performed at [facility], [place] [address], [place], [place] [address]      Scheduled Meds: . folic acid  1 mg Oral Daily  . levETIRAcetam  500 mg Oral BID  .  LORazepam  0-4 mg Intravenous Q12H  . multivitamin with minerals  1 tablet Oral Daily  . pantoprazole  40 mg Oral Daily  . phenytoin  300 mg Oral Daily  . phytonadione  10 mg Oral Daily  . thiamine  100 mg Oral Daily   Continuous Infusions: . lactated ringers Stopped (11/25/19 1930)     LOS: 3 days   Cherene Altes, MD [facility] Office  [phone] Pager - Text Page per Amion  If 7PM-7AM, please contact night-coverage per Amion 11/26/2019, 8:42 AM

## 2019-11-27 LAB — CBC
HCT: 25.5 % — ABNORMAL LOW (ref 39.0–52.0)
Hemoglobin: 9.1 g/dL — ABNORMAL LOW (ref 13.0–17.0)
MCH: 32.6 pg (ref 26.0–34.0)
MCHC: 35.7 g/dL (ref 30.0–36.0)
MCV: 91.4 fL (ref 80.0–100.0)
Platelets: 222 10*3/uL (ref 150–400)
RBC: 2.79 MIL/uL — ABNORMAL LOW (ref 4.22–5.81)
RDW: 18.3 % — ABNORMAL HIGH (ref 11.5–15.5)
WBC: 14.6 10*3/uL — ABNORMAL HIGH (ref 4.0–10.5)
nRBC: 0 % (ref 0.0–0.2)

## 2019-11-27 LAB — COMPREHENSIVE METABOLIC PANEL
ALT: 32 U/L (ref 0–44)
AST: 100 U/L — ABNORMAL HIGH (ref 15–41)
Albumin: 2.1 g/dL — ABNORMAL LOW (ref 3.5–5.0)
Alkaline Phosphatase: 156 U/L — ABNORMAL HIGH (ref 38–126)
Anion gap: 11 (ref 5–15)
BUN: 5 mg/dL — ABNORMAL LOW (ref 6–20)
CO2: 20 mmol/L — ABNORMAL LOW (ref 22–32)
Calcium: 8.7 mg/dL — ABNORMAL LOW (ref 8.9–10.3)
Chloride: 102 mmol/L (ref 98–111)
Creatinine, Ser: 0.69 mg/dL (ref 0.61–1.24)
GFR calc Af Amer: >60 mL/min (ref >60)
GFR calc non Af Amer: >60 mL/min (ref >60)
Glucose, Bld: 112 mg/dL — ABNORMAL HIGH (ref 70–99)
Potassium: 3.1 mmol/L — ABNORMAL LOW (ref 3.5–5.1)
Sodium: 133 mmol/L — ABNORMAL LOW (ref 135–145)
Total Bilirubin: 27.6 mg/dL (ref 0.3–1.2)
Total Protein: 7.1 g/dL (ref 6.5–8.1)

## 2019-11-27 LAB — PROTIME-INR
INR: 1.5 — ABNORMAL HIGH (ref 0.8–1.2)
Prothrombin Time: 17.5 seconds — ABNORMAL HIGH (ref 11.4–15.2)

## 2019-11-27 LAB — GLUCOSE, CAPILLARY
Glucose-Capillary: 107 mg/dL — ABNORMAL HIGH (ref 70–99)
Glucose-Capillary: 132 mg/dL — ABNORMAL HIGH (ref 70–99)
Glucose-Capillary: 242 mg/dL — ABNORMAL HIGH (ref 70–99)

## 2019-11-27 LAB — MITOCHONDRIAL ANTIBODIES: Mitochondrial M2 Ab, IgG: <20 Units (ref 0.0–20.0)

## 2019-11-27 LAB — MAGNESIUM: Magnesium: 1.7 mg/dL (ref 1.7–2.4)

## 2019-11-27 LAB — PHENYTOIN LEVEL, TOTAL: Phenytoin Lvl: 5.6 ug/mL — ABNORMAL LOW (ref 10.0–20.0)

## 2019-11-27 LAB — ANTI-SMOOTH MUSCLE ANTIBODY, IGG: F-Actin IgG: 14 Units (ref 0–19)

## 2019-11-27 MED ORDER — MAGNESIUM SULFATE 2 GM/50ML IV SOLN
2.0000 g | Freq: Once | INTRAVENOUS | Status: DC
  Filled 2019-11-27: qty 50

## 2019-11-27 MED ORDER — POTASSIUM CHLORIDE CRYS ER 20 MEQ PO TBCR
40.0000 meq | EXTENDED_RELEASE_TABLET | Freq: Two times a day (BID) | ORAL | Status: AC
  Administered 2019-11-27 – 2019-11-28 (×3): 40 meq via ORAL
  Filled 2019-11-27 (×3): qty 2

## 2019-11-27 NOTE — Progress Notes (Signed)
Gary Mitchell  [id number] DOB: 18-Jan-1971 DOA: 11/23/2019 PCP: Patient, No Pcp Per    Brief Narrative:  49 year old with a history of alcoholism and alcohol-related seizures who presented to the ED with abdominal pain nausea and vomiting x5 days. In the ED CT abdomen and pelvis noted findings consistent with pancreatitis as well as probable portal hypertension and cholelithiasis.  Significant Events: 5/18 admit via [place]  Antimicrobials:  Rocephin 5/18 Zosyn 5/18 > 5/19  Subjective: Resting comfortably in bed.  Has no new complaints.  States he is tolerating his diet without difficulty.  Denies shortness of breath.  Denies abdominal pain.  Assessment & Plan:  Acute alcohol-related pancreatitis Has been counseled on absolute need for alcohol abstinence -clinically much improved -tolerating low-fat diet at this time  Alcoholic hepatitis Discriminant score 53 -steroid treatment initiated by GI 5/21 -total bili improved today for first time since admission -follow-up in a.m.  Cirrhosis of the liver  Due to heavy alcohol abuse -other factors being ruled out with serologic work-up  Hyponatremia Due to alcoholism and cirrhosis -sodium is slowly improving  Severe hypokalemia Continue to supplement -likely has significant total body deficit related to malnutrition of alcoholism -not yet normalized/stable  Hypomagnesemia Continue to supplement to goal of 2.0  Abnormal lesion in body of pancreas Will need eventual biopsy by EUS -GI now feels it is most appropriate to do this in the outpatient setting when the patient is given more time to recover from his acute pancreatitis and alcoholic hepatitis  Cholelithiasis Not believed to be a significant contributor to his current acute illness  Alcoholism  Anemia Due to above -no evidence of significant acute blood loss -follow trend  Alcohol related seizures  DVT prophylaxis: SCDs Code Status: FULL CODE Family Communication:     Consultants:  General surgery [place] [facility]  Objective: Blood pressure 119/82, pulse 89, temperature 98.5 F (36.9 C), temperature source Oral, resp. rate 18, height 5\' 4"  (1.626 m), weight 63.1 kg, SpO2 100 %. No intake or output data in the 24 hours ending 11/27/19 0950 Filed Weights   11/24/19 0500  Weight: 63.1 kg    Examination: General: No acute respiratory distress Lungs: CTA B without wheezing Cardiovascular: RRR  Abdomen: NT/ND, soft, bs+, no mass Extremities: No edema bilateral lower extremities  CBC: Recent Labs  Lab 11/24/19 0832 11/24/19 0832 11/25/19 0429 11/26/19 0119 11/27/19 0718  WBC 19.6*   < > 14.4* 12.5* 14.6*  NEUTROABS 16.1*  --   --   --   --   HGB 10.0*   < > 9.4* 8.9* 9.1*  HCT 27.5*   < > 26.1* 25.1* 25.5*  MCV 90.5   < > 90.9 90.9 91.4  PLT 159   < > 158 176 222   < > = values in this interval not displayed.   Basic Metabolic Panel: Recent Labs  Lab 11/25/19 0429 11/26/19 0119 11/27/19 0718  NA 129* 131* 133*  K 2.7* 3.2* 3.1*  CL 95* 101 102  CO2 22 20* 20*  GLUCOSE 121* 121* 112*  BUN 5* <5* 5*  CREATININE 0.68 0.70 0.69  CALCIUM 8.1* 8.3* 8.7*  MG 1.6*  --  1.7   GFR: Estimated Creatinine Clearance: 95.6 mL/min (by C-G formula based on SCr of 0.69 mg/dL).  Liver Function Tests: Recent Labs  Lab 11/24/19 0832 11/25/19 0429 11/26/19 0119 11/27/19 0718  AST 93* 74* 76* 100*  ALT 35 25 29 32  ALKPHOS 158* 147* 135* 156*  BILITOT 36.9* 35.3* 38.0* 27.6*  PROT 8.1 7.0 7.0 7.1  ALBUMIN 2.4* 2.1* 2.0* 2.1*   Recent Labs  Lab 11/23/19 1354  LIPASE 284*    Coagulation Profile: Recent Labs  Lab 11/24/19 0009 11/25/19 0429 11/26/19 0119 11/27/19 0718  INR 1.4* 1.5* 1.6* 1.5*    CBG: Recent Labs  Lab 11/26/19 0003 11/26/19 0806 11/26/19 1646 11/26/19 2123 11/27/19 0749  GLUCAP 122* 190* 260* 173* 107*    Recent Results (from the past 240 hour(s))  SARS Coronavirus 2 by RT PCR (hospital  order, performed in [place] hospital lab) Nasopharyngeal Nasopharyngeal Swab     Status: None   Collection Time: 11/23/19 10:47 PM   Specimen: Nasopharyngeal Swab  Result Value Ref Range Status   SARS Coronavirus 2 NEGATIVE NEGATIVE Final    Comment: (NOTE) SARS-CoV-2 target nucleic acids are NOT DETECTED. The SARS-CoV-2 RNA is generally detectable in upper and lower respiratory specimens during the acute phase of infection. The lowest concentration of SARS-CoV-2 viral copies this assay can detect is 250 copies / mL. A negative result does not preclude SARS-CoV-2 infection and should not be used as the sole basis for treatment or other patient management decisions.  A negative result may occur with improper specimen collection / handling, submission of specimen other than nasopharyngeal swab, presence of viral mutation(s) within the areas targeted by this assay, and inadequate number of viral copies (<250 copies / mL). A negative result must be combined with clinical observations, patient history, and epidemiological information. Fact Sheet for Patients:   StrictlyIdeas.no Fact Sheet for Healthcare Providers: BankingDealers.co.za This test is not yet approved or cleared  by the [place] FDA and has been authorized for detection and/or diagnosis of SARS-CoV-2 by FDA under an Emergency Use Authorization (EUA).  This EUA will remain in effect (meaning this test can be used) for the duration of the COVID-19 declaration under Section 564(b)(1) of the Act, 21 U.S.C. section 360bbb-3(b)(1), unless the authorization is terminated or revoked sooner. Performed at [facility], [place] [address], [place], [place] [address]      Scheduled Meds: . folic acid  1 mg Oral Daily  . levETIRAcetam  500 mg Oral BID  . LORazepam  0-4 mg Intravenous Q12H  . multivitamin with minerals  1 tablet Oral Daily  . pantoprazole  40 mg Oral Daily  .  phenytoin  300 mg Oral Daily  . prednisoLONE  40 mg Oral Daily  . thiamine  100 mg Oral Daily      LOS: 4 days   Cherene Altes, MD [facility] Office  [phone] Pager - Text Page per Amion  If 7PM-7AM, please contact night-coverage per Amion 11/27/2019, 9:50 AM

## 2019-11-28 LAB — COMPREHENSIVE METABOLIC PANEL
ALT: 38 U/L (ref 0–44)
AST: 88 U/L — ABNORMAL HIGH (ref 15–41)
Albumin: 2 g/dL — ABNORMAL LOW (ref 3.5–5.0)
Alkaline Phosphatase: 151 U/L — ABNORMAL HIGH (ref 38–126)
Anion gap: 8 (ref 5–15)
BUN: 7 mg/dL (ref 6–20)
CO2: 21 mmol/L — ABNORMAL LOW (ref 22–32)
Calcium: 8.8 mg/dL — ABNORMAL LOW (ref 8.9–10.3)
Chloride: 104 mmol/L (ref 98–111)
Creatinine, Ser: 0.55 mg/dL — ABNORMAL LOW (ref 0.61–1.24)
GFR calc Af Amer: >60 mL/min (ref >60)
GFR calc non Af Amer: >60 mL/min (ref >60)
Glucose, Bld: 106 mg/dL — ABNORMAL HIGH (ref 70–99)
Potassium: 3.7 mmol/L (ref 3.5–5.1)
Sodium: 133 mmol/L — ABNORMAL LOW (ref 135–145)
Total Bilirubin: 24.1 mg/dL (ref 0.3–1.2)
Total Protein: 6.7 g/dL (ref 6.5–8.1)

## 2019-11-28 LAB — MAGNESIUM: Magnesium: 1.5 mg/dL — ABNORMAL LOW (ref 1.7–2.4)

## 2019-11-28 LAB — HEMOGLOBIN A1C
Hgb A1c MFr Bld: 4.3 % — ABNORMAL LOW (ref 4.8–5.6)
Mean Plasma Glucose: 76.71 mg/dL

## 2019-11-28 LAB — GLUCOSE, CAPILLARY: Glucose-Capillary: 175 mg/dL — ABNORMAL HIGH (ref 70–99)

## 2019-11-28 MED ORDER — MAGNESIUM SULFATE 4 GM/100ML IV SOLN
4.0000 g | Freq: Once | INTRAVENOUS | Status: AC
  Administered 2019-11-28: 4 g via INTRAVENOUS
  Filled 2019-11-28: qty 100

## 2019-11-28 NOTE — Progress Notes (Signed)
[facility] Progress Note    Since last GI note: Eating well, no abd pains, ambulating in room.    Objective: Vital signs in last 24 hours: Temp:  [98.2 F (36.8 C)-98.9 F (37.2 C)] 98.2 F (36.8 C) (05/23 0443) Pulse Rate:  [90-95] 90 (05/23 0443) Resp:  [18] 18 (05/23 0443) BP: (120-125)/(78-81) 120/79 (05/23 0443) SpO2:  [95 %-99 %] 97 % (05/23 0443) Last BM Date: 11/27/19 General: alert and oriented times 3 Heart: regular rate and rythm Abdomen: soft, non-tender, non-distended, normal bowel sounds  Lab Results: Recent Labs    11/26/19 0119 11/27/19 0718  WBC 12.5* 14.6*  HGB 8.9* 9.1*  PLT 176 222  MCV 90.9 91.4   Recent Labs    11/26/19 0119 11/27/19 0718 11/28/19 0513  NA 131* 133* 133*  K 3.2* 3.1* 3.7  CL 101 102 104  CO2 20* 20* 21*  GLUCOSE 121* 112* 106*  BUN <5* 5* 7  CREATININE 0.70 0.69 0.55*  CALCIUM 8.3* 8.7* 8.8*   Recent Labs    11/26/19 0119 11/27/19 0718 11/28/19 0513  PROT 7.0 7.1 6.7  ALBUMIN 2.0* 2.1* 2.0*  AST 76* 100* 88*  ALT 29 32 38  ALKPHOS 135* 156* 151*  BILITOT 38.0* 27.6* 24.1*   Recent Labs    11/26/19 0119 11/27/19 0718  INR 1.6* 1.5*    Medications: Scheduled Meds: . folic acid  1 mg Oral Daily  . levETIRAcetam  500 mg Oral BID  . multivitamin with minerals  1 tablet Oral Daily  . pantoprazole  40 mg Oral Daily  . phenytoin  300 mg Oral Daily  . potassium chloride  40 mEq Oral BID  . prednisoLONE  40 mg Oral Daily  . thiamine  100 mg Oral Daily   Continuous Infusions: . magnesium sulfate bolus IVPB     PRN Meds:.ondansetron **OR** ondansetron (ZOFRAN) IV   Assessment/Plan: 49 y.o. male admitted with severe alcoholic hepatitis, abnormal body of pancreas.  Day 3/30 oral steroids for severe alcoholic hepatitis.  His LFTs are finally clearly improving and I think it is safe for d/c home today.  My office will contact him about repeat labs (cbc, cmet, INR) later this week and will also  schedule upper EUS evaluation of his pancreas in 4-5 weeks from now to allow complete steroid course, further time for his alc hep to improve.  Prednisolone is very expensive and so it is ok to d/c on prednisone 30mg  daily for 3 weeks, then a taper over 1 week to zero.    He knows he should not resume alcohol drinking.  Please call or page with any further questions or concerns.  Milus Banister, MD  11/28/2019, 10:30 AM [facility] Pager [phone]

## 2019-11-28 NOTE — Plan of Care (Signed)
  Problem: Education: Goal: Knowledge of General Education information will improve Description Including pain rating scale, medication(s)/side effects and non-pharmacologic comfort measures Outcome: Progressing   

## 2019-11-28 NOTE — Progress Notes (Signed)
Gary Mitchell  [id number] DOB: 07-Aug-1970 DOA: 11/23/2019 PCP: Patient, No Pcp Per    Brief Narrative:  49 year old with a history of alcoholism and alcohol-related seizures who presented to the ED with abdominal pain nausea and vomiting x5 days. In the ED CT abdomen and pelvis noted findings consistent with pancreatitis as well as probable portal hypertension and cholelithiasis.  Significant Events: 5/18 admit via [place]  Antimicrobials:  Rocephin 5/18 Zosyn 5/18 > 5/19  Subjective: No new complaints today.  Tolerating diet without difficulty.  Up and around the room without trouble.  Total bili/LFTs improving with ongoing use of steroid.  Assessment & Plan:  Acute alcohol-related pancreatitis Has been counseled on absolute need for alcohol abstinence -clinically much improved -tolerating low-fat diet at this time   Alcoholic hepatitis Initial discriminant score 53 -steroid treatment initiated by GI 5/21 -total bili continues to improve -anticipate discharge home tomorrow  Cirrhosis of the liver  Due to heavy alcohol abuse -other factors being ruled out with serologic work-up  Hyponatremia Due to alcoholism and cirrhosis -sodium is slowly improving  Severe hypokalemia likely has significant total body deficit related to malnutrition of alcoholism -now within target range -recheck in a.m.  Hypomagnesemia Continue to supplement to goal of 2.0  Abnormal lesion in body of pancreas Will need eventual biopsy by EUS -GI now feels it is most appropriate to do this in the outpatient setting when the patient is given more time to recover from his acute pancreatitis and alcoholic hepatitis  Cholelithiasis Not believed to be a significant contributor to his current acute illness  Alcoholism  Anemia Due to above -no evidence of significant acute blood loss -follow trend  Alcohol related seizures  DVT prophylaxis: SCDs Code Status: FULL CODE Family Communication: No  family present at time of exam Status is: Inpatient  Remains inpatient appropriate because:Inpatient level of care appropriate due to severity of illness   Dispo: The patient is from: Home              Anticipated d/c is to: Home              Anticipated d/c date is: 1 day              Patient currently is not medically stable to d/c.   Consultants:  General surgery [place] [facility]  Objective: Blood pressure 120/79, pulse 90, temperature 98.2 F (36.8 C), temperature source Oral, resp. rate 18, height 5\' 4"  (1.626 m), weight 63.1 kg, SpO2 97 %.  Intake/Output Summary (Last 24 hours) at 11/28/2019 0840 Last data filed at 11/27/2019 1746 Gross per 24 hour  Intake 720 ml  Output --  Net 720 ml   Filed Weights   11/24/19 0500  Weight: 63.1 kg    Examination: General: No acute respiratory distress Lungs: CTA B without wheezing Cardiovascular: RRR without murmur Abdomen: NT/ND, soft, bs+, no mass Extremities: No edema B LE  CBC: Recent Labs  Lab 11/24/19 0832 11/24/19 0832 11/25/19 0429 11/26/19 0119 11/27/19 0718  WBC 19.6*   < > 14.4* 12.5* 14.6*  NEUTROABS 16.1*  --   --   --   --   HGB 10.0*   < > 9.4* 8.9* 9.1*  HCT 27.5*   < > 26.1* 25.1* 25.5*  MCV 90.5   < > 90.9 90.9 91.4  PLT 159   < > 158 176 222   < > = values in this interval not displayed.   Basic Metabolic Panel:  Recent Labs  Lab 11/25/19 0429 11/25/19 0429 11/26/19 0119 11/27/19 0718 11/28/19 0513  NA 129*   < > 131* 133* 133*  K 2.7*   < > 3.2* 3.1* 3.7  CL 95*   < > 101 102 104  CO2 22   < > 20* 20* 21*  GLUCOSE 121*   < > 121* 112* 106*  BUN 5*   < > <5* 5* 7  CREATININE 0.68   < > 0.70 0.69 0.55*  CALCIUM 8.1*   < > 8.3* 8.7* 8.8*  MG 1.6*  --   --  1.7 1.5*   < > = values in this interval not displayed.   GFR: Estimated Creatinine Clearance: 95.6 mL/min (A) (by C-G formula based on SCr of 0.55 mg/dL (L)).  Liver Function Tests: Recent Labs  Lab 11/25/19 0429  11/26/19 0119 11/27/19 0718 11/28/19 0513  AST 74* 76* 100* 88*  ALT 25 29 32 38  ALKPHOS 147* 135* 156* 151*  BILITOT 35.3* 38.0* 27.6* 24.1*  PROT 7.0 7.0 7.1 6.7  ALBUMIN 2.1* 2.0* 2.1* 2.0*   Recent Labs  Lab 11/23/19 1354  LIPASE 284*    Coagulation Profile: Recent Labs  Lab 11/24/19 0009 11/25/19 0429 11/26/19 0119 11/27/19 0718  INR 1.4* 1.5* 1.6* 1.5*    CBG: Recent Labs  Lab 11/26/19 1646 11/26/19 2123 11/27/19 0749 11/27/19 1700 11/27/19 2355  GLUCAP 260* 173* 107* 242* 132*    Recent Results (from the past 240 hour(s))  SARS Coronavirus 2 by RT PCR (hospital order, performed in [place] hospital lab) Nasopharyngeal Nasopharyngeal Swab     Status: None   Collection Time: 11/23/19 10:47 PM   Specimen: Nasopharyngeal Swab  Result Value Ref Range Status   SARS Coronavirus 2 NEGATIVE NEGATIVE Final    Comment: (NOTE) SARS-CoV-2 target nucleic acids are NOT DETECTED. The SARS-CoV-2 RNA is generally detectable in upper and lower respiratory specimens during the acute phase of infection. The lowest concentration of SARS-CoV-2 viral copies this assay can detect is 250 copies / mL. A negative result does not preclude SARS-CoV-2 infection and should not be used as the sole basis for treatment or other patient management decisions.  A negative result may occur with improper specimen collection / handling, submission of specimen other than nasopharyngeal swab, presence of viral mutation(s) within the areas targeted by this assay, and inadequate number of viral copies (<250 copies / mL). A negative result must be combined with clinical observations, patient history, and epidemiological information. Fact Sheet for Patients:   StrictlyIdeas.no Fact Sheet for Healthcare Providers: BankingDealers.co.za This test is not yet approved or cleared  by the [place] FDA and has been authorized for detection and/or  diagnosis of SARS-CoV-2 by FDA under an Emergency Use Authorization (EUA).  This EUA will remain in effect (meaning this test can be used) for the duration of the COVID-19 declaration under Section 564(b)(1) of the Act, 21 U.S.C. section 360bbb-3(b)(1), unless the authorization is terminated or revoked sooner. Performed at [facility], [place] [address] [place], [place] [address]      Scheduled Meds: . folic acid  1 mg Oral Daily  . levETIRAcetam  500 mg Oral BID  . multivitamin with minerals  1 tablet Oral Daily  . pantoprazole  40 mg Oral Daily  . phenytoin  300 mg Oral Daily  . potassium chloride  40 mEq Oral BID  . prednisoLONE  40 mg Oral Daily  . thiamine  100 mg  Oral Daily   . magnesium sulfate bolus IVPB    . magnesium sulfate bolus IVPB       LOS: 5 days   Cherene Altes, MD [facility] Office  [phone] Pager - Text Page per Amion  If 7PM-7AM, please contact night-coverage per Amion 11/28/2019, 8:40 AM

## 2019-11-29 DIAGNOSIS — K8689 Other specified diseases of pancreas: Secondary | ICD-10-CM

## 2019-11-29 DIAGNOSIS — K852 Alcohol induced acute pancreatitis without necrosis or infection: Principal | ICD-10-CM

## 2019-11-29 DIAGNOSIS — R17 Unspecified jaundice: Secondary | ICD-10-CM

## 2019-11-29 LAB — COMPREHENSIVE METABOLIC PANEL
ALT: 40 U/L (ref 0–44)
AST: 96 U/L — ABNORMAL HIGH (ref 15–41)
Albumin: 2.3 g/dL — ABNORMAL LOW (ref 3.5–5.0)
Alkaline Phosphatase: 186 U/L — ABNORMAL HIGH (ref 38–126)
Anion gap: 12 (ref 5–15)
BUN: 6 mg/dL (ref 6–20)
CO2: 20 mmol/L — ABNORMAL LOW (ref 22–32)
Calcium: 9.1 mg/dL (ref 8.9–10.3)
Chloride: 100 mmol/L (ref 98–111)
Creatinine, Ser: 0.67 mg/dL (ref 0.61–1.24)
GFR calc Af Amer: >60 mL/min (ref >60)
GFR calc non Af Amer: >60 mL/min (ref >60)
Glucose, Bld: 113 mg/dL — ABNORMAL HIGH (ref 70–99)
Potassium: 4.4 mmol/L (ref 3.5–5.1)
Sodium: 132 mmol/L — ABNORMAL LOW (ref 135–145)
Total Bilirubin: 25.3 mg/dL (ref 0.3–1.2)
Total Protein: 8 g/dL (ref 6.5–8.1)

## 2019-11-29 LAB — CBC
HCT: 32.1 % — ABNORMAL LOW (ref 39.0–52.0)
Hemoglobin: 11.3 g/dL — ABNORMAL LOW (ref 13.0–17.0)
MCH: 32 pg (ref 26.0–34.0)
MCHC: 35.2 g/dL (ref 30.0–36.0)
MCV: 90.9 fL (ref 80.0–100.0)
Platelets: 299 10*3/uL (ref 150–400)
RBC: 3.53 MIL/uL — ABNORMAL LOW (ref 4.22–5.81)
RDW: 18.6 % — ABNORMAL HIGH (ref 11.5–15.5)
WBC: 15.9 10*3/uL — ABNORMAL HIGH (ref 4.0–10.5)
nRBC: 0 % (ref 0.0–0.2)

## 2019-11-29 LAB — MAGNESIUM: Magnesium: 1.9 mg/dL (ref 1.7–2.4)

## 2019-11-29 MED ORDER — PREDNISONE 10 MG PO TABS: 10.0000 mg | ORAL_TABLET | Freq: Every day | ORAL | Status: DC

## 2019-11-29 MED ORDER — PANTOPRAZOLE SODIUM 40 MG PO TBEC: 40.0000 mg | DELAYED_RELEASE_TABLET | Freq: Every day | ORAL | 1 refills | Status: DC

## 2019-11-29 MED ORDER — PREDNISONE 20 MG PO TABS: 20.0000 mg | ORAL_TABLET | Freq: Every day | ORAL | 0 refills | Status: AC

## 2019-11-29 MED ORDER — FOLIC ACID 1 MG PO TABS: 1.0000 mg | ORAL_TABLET | Freq: Every day | ORAL | Status: DC

## 2019-11-29 MED ORDER — ADULT MULTIVITAMIN W/MINERALS CH: 1.0000 | ORAL_TABLET | Freq: Every day | ORAL | Status: DC

## 2019-11-29 MED ORDER — LEVETIRACETAM 500 MG PO TABS
500.0000 mg | ORAL_TABLET | Freq: Two times a day (BID) | ORAL | 1 refills | Status: DC
Start: 2019-11-29 — End: 2020-01-12

## 2019-11-29 MED ORDER — THIAMINE HCL 100 MG PO TABS: 100.0000 mg | ORAL_TABLET | Freq: Every day | ORAL | Status: DC

## 2019-11-29 MED ORDER — PREDNISONE 20 MG PO TABS: 30.0000 mg | ORAL_TABLET | Freq: Every day | ORAL | Status: DC

## 2019-11-29 MED ORDER — PREDNISONE 20 MG PO TABS: 20.0000 mg | ORAL_TABLET | Freq: Every day | ORAL | Status: DC

## 2019-11-29 MED ORDER — PHENYTOIN SODIUM EXTENDED 100 MG PO CAPS
300.0000 mg | ORAL_CAPSULE | Freq: Every day | ORAL | 1 refills | Status: DC
Start: 2019-11-29 — End: 2020-07-10

## 2019-11-29 MED ORDER — PREDNISONE 10 MG PO TABS: 30.0000 mg | ORAL_TABLET | Freq: Every day | ORAL | 0 refills | Status: AC

## 2019-11-29 MED ORDER — PREDNISONE 10 MG PO TABS: 10.0000 mg | ORAL_TABLET | Freq: Every day | ORAL | 0 refills | Status: AC

## 2019-11-29 MED FILL — predniSONE 10 MG TABS: 10 | 28 days supply | Qty: 74 | Fill #0

## 2019-11-29 MED FILL — PHENYTOIN SOD EXT 100 MG CA: 100 | 30 days supply | Qty: 90 | Fill #0

## 2019-11-29 MED FILL — levETIRAcetam 500 MG TABS: 500 | 30 days supply | Qty: 60 | Fill #0

## 2019-11-29 MED FILL — PANTOPRAZOLE SOD DR 40 MG T: 40 | 30 days supply | Qty: 30 | Fill #0

## 2019-11-29 NOTE — Progress Notes (Signed)
Patient discharged to home. Patient's mother at bedside for discharge instructions. Verbalizes understanding of all discharge instructions including discharge medications and follow up MD visits.

## 2019-11-29 NOTE — Discharge Summary (Signed)
DISCHARGE SUMMARY  Gary Mitchell  MR#: [id number]  DOB:1971-01-09  Date of Admission: 11/23/2019 Date of Discharge: 11/29/2019  Attending Physician:Jeffrey Hennie Duos, MD  Patient's QP:[id number], No Pcp Per  Consults: [facility]  Disposition: D/C home   Follow-up Appts: Follow-up [facility] Follow up on 12/08/2019.   Why: 10:30am appointment was made. Please call if unable to go. Contact information: [place] [phone]          Tests Needing Follow-up: -recehck CBC, CMET, INR in f/u w/ GI team  -pt requires EUS eval of pancreatic mass w/ bx  Discharge Diagnoses: Acute alcohol-related pancreatitis Alcoholic hepatitis Cirrhosis of the liver  Hyponatremia Severe hypokalemia Hypomagnesemia Abnormal lesion in body of pancreas Cholelithiasis Alcoholism Anemia Alcohol related seizures  Initial presentation: 49 year old with a history of alcoholism and alcohol-related seizures who presented to the ED with abdominal pain nausea and vomiting x5 days. In the ED CT abdomen and pelvis noted findings consistent with pancreatitis as well as probable portal hypertension and cholelithiasis.  Hospital Course:  Acute alcohol-related pancreatitis Has been counseled on absolute need for alcohol abstinence and link between his acute illness and alcohol abuse -clinically much improved - tolerating low-fat diet at time of d/c   Alcoholic hepatitis Initial discriminant score 53 -steroid treatment initiated by GI 5/21 -total bili has begun to trend downward - to be followed up in outpt setting by GI Team - to complete ~30 days of steroid tx   Cirrhosis of the liver  Due to heavy alcohol abuse -other factors being ruled out with serologic work-up - to f/u w/ GI in outpt setting for continued monitoring   Hyponatremia Due to alcoholism and cirrhosis -  sodium stable at ~133 at time of d/c   Severe hypokalemia likely has significant total body deficit related to malnutrition of alcoholism - corrected to goal w/ high dose replacement   Hypomagnesemia Supplemented to goal w/ high dose replacement   Abnormal lesion in body of pancreas Will need eventual biopsy by EUS -GI now feels it is most appropriate to do this in the outpatient setting when the patient is given more time to recover from his acute pancreatitis and alcoholic hepatitis - GI to arrange f/u appointment   Cholelithiasis Not believed to be a significant contributor to his current acute illness  Alcoholism As above, has been counseled on absolute need to fully an completely abstain from further alcohol use  Anemia Due to above -no evidence of significant acute blood loss -follow trend  Alcohol related seizures Cont usual seizure meds for now - if pt continues to abstain these meds could likely be tapered to off in a stepwise fashion in 3-4 months  Allergies as of 11/29/2019      Reactions   Iohexol Other (See Comments)   Unknown reaction at 10 days old      Medication List    STOP taking these medications   ibuprofen 200 MG tablet Commonly known as: ADVIL     TAKE these medications   folic acid 1 MG tablet Commonly known as: FOLVITE Take 1 tablet (1 mg total) by mouth daily. Start taking on: Nov 30, 2019   levETIRAcetam 500 MG tablet Commonly known as: Keppra Take 1 tablet (500 mg total) by mouth 2 (two) times daily.   multivitamin with minerals Tabs tablet Take 1 tablet by mouth daily. Start taking on: Nov 30, 2019  pantoprazole 40 MG tablet Commonly known as: PROTONIX Take 1 tablet (40 mg total) by mouth daily. Start taking on: Nov 30, 2019   phenytoin 100 MG ER capsule Commonly known as: Dilantin Take 3 capsules (300 mg total) by mouth daily.   predniSONE 10 MG tablet Commonly known as: DELTASONE Take 3 tablets (30 mg total) by mouth  daily with breakfast for 21 days. Start taking on: Nov 30, 2019   predniSONE 20 MG tablet Commonly known as: DELTASONE Take 1 tablet (20 mg total) by mouth daily with breakfast for 4 days. Start taking on: December 21, 2019   predniSONE 10 MG tablet Commonly known as: DELTASONE Take 1 tablet (10 mg total) by mouth daily with breakfast for 3 days. Start taking on: December 25, 2019   thiamine 100 MG tablet Take 1 tablet (100 mg total) by mouth daily. Start taking on: Nov 30, 2019       Day of Discharge BP 117/86 (BP Location: Right Arm)   Pulse 79   Temp 98.1 F (36.7 C) (Oral)   Resp 16   Ht 5\' 4"  (1.626 m) Comment: per EPIC records  Wt 63.1 kg   SpO2 100%   BMI 23.88 kg/m   Physical Exam: General: No acute respiratory distress Lungs: Clear to auscultation bilaterally without wheezes or crackles Cardiovascular: Regular rate and rhythm without murmur gallop or rub normal S1 and S2 Abdomen: Nontender, nondistended, soft, bowel sounds positive, no rebound, no ascites, no appreciable mass Extremities: No significant cyanosis, clubbing, or edema bilateral lower extremities  Basic Metabolic Panel: Recent Labs  Lab 11/25/19 0429 11/26/19 0119 11/27/19 0718 11/28/19 0513 11/29/19 0412  NA 129* 131* 133* 133* 132*  K 2.7* 3.2* 3.1* 3.7 4.4  CL 95* 101 102 104 100  CO2 22 20* 20* 21* 20*  GLUCOSE 121* 121* 112* 106* 113*  BUN 5* <5* 5* 7 6  CREATININE 0.68 0.70 0.69 0.55* 0.67  CALCIUM 8.1* 8.3* 8.7* 8.8* 9.1  MG 1.6*  --  1.7 1.5* 1.9    Liver Function Tests: Recent Labs  Lab 11/25/19 0429 11/26/19 0119 11/27/19 0718 11/28/19 0513 11/29/19 0412  AST 74* 76* 100* 88* 96*  ALT 25 29 32 38 40  ALKPHOS 147* 135* 156* 151* 186*  BILITOT 35.3* 38.0* 27.6* 24.1* 25.3*  PROT 7.0 7.0 7.1 6.7 8.0  ALBUMIN 2.1* 2.0* 2.1* 2.0* 2.3*   Recent Labs  Lab 11/23/19 1354  LIPASE 284*   Coags: Recent Labs  Lab 11/24/19 0009 11/25/19 0429 11/26/19 0119 11/27/19 0718   INR 1.4* 1.5* 1.6* 1.5*   CBC: Recent Labs  Lab 11/24/19 0832 11/25/19 0429 11/26/19 0119 11/27/19 0718 11/29/19 0412  WBC 19.6* 14.4* 12.5* 14.6* 15.9*  NEUTROABS 16.1*  --   --   --   --   HGB 10.0* 9.4* 8.9* 9.1* 11.3*  HCT 27.5* 26.1* 25.1* 25.5* 32.1*  MCV 90.5 90.9 90.9 91.4 90.9  PLT 159 158 176 222 299    CBG: Recent Labs  Lab 11/26/19 2123 11/27/19 0749 11/27/19 1700 11/27/19 2355 11/28/19 2203  GLUCAP 173* 107* 242* 132* 175*    Recent Results (from the past 240 hour(s))  SARS Coronavirus 2 by RT PCR (hospital order, performed in [facility] hospital lab) Nasopharyngeal Nasopharyngeal Swab     Status: None   Collection Time: 11/23/19 10:47 PM   Specimen: Nasopharyngeal Swab  Result Value Ref Range Status   SARS Coronavirus 2 NEGATIVE NEGATIVE Final    Comment: (  NOTE) SARS-CoV-2 target nucleic acids are NOT DETECTED. The SARS-CoV-2 RNA is generally detectable in upper and lower respiratory specimens during the acute phase of infection. The lowest concentration of SARS-CoV-2 viral copies this assay can detect is 250 copies / mL. A negative result does not preclude SARS-CoV-2 infection and should not be used as the sole basis for treatment or other patient management decisions.  A negative result may occur with improper specimen collection / handling, submission of specimen other than nasopharyngeal swab, presence of viral mutation(s) within the areas targeted by this assay, and inadequate number of viral copies (<250 copies / mL). A negative result must be combined with clinical observations, patient history, and epidemiological information. Fact Sheet for Patients:   StrictlyIdeas.no Fact Sheet for Healthcare Providers: BankingDealers.co.za This test is not yet approved or cleared  by the [place] FDA and has been authorized for detection and/or diagnosis of SARS-CoV-2 by FDA under an Emergency Use  Authorization (EUA).  This EUA will remain in effect (meaning this test can be used) for the duration of the COVID-19 declaration under Section 564(b)(1) of the Act, 21 U.S.C. section 360bbb-3(b)(1), unless the authorization is terminated or revoked sooner. Performed at [facility], [place] [address], [place], [place] [address]       Time spent in discharge (includes decision making & examination of pt): 35 minutes  11/29/2019, 10:55 AM   Cherene Altes, MD [facility] Office  [phone]

## 2019-11-29 NOTE — Care Management (Signed)
Patient has follow up appointment scheduled and on AVS. Prescriptions sent to [facility]. Patient entered in [facility]

## 2019-11-30 ENCOUNTER — Telehealth: Payer: Self-pay

## 2019-11-30 DIAGNOSIS — K859 Acute pancreatitis without necrosis or infection, unspecified: Secondary | ICD-10-CM

## 2019-11-30 NOTE — Telephone Encounter (Signed)
-----   Message from Milus Banister, MD sent at 11/28/2019 10:41 AM EDT ----- He is going home today.   Needs labs in 5-6 days (cbc, cmet, INR).  Needs EUS with me in 3-4 weeks for pancreatic mass.  Thanks

## 2019-12-01 ENCOUNTER — Other Ambulatory Visit: Payer: Self-pay

## 2019-12-01 DIAGNOSIS — K8689 Other specified diseases of pancreas: Secondary | ICD-10-CM

## 2019-12-01 NOTE — Telephone Encounter (Signed)
Lab order entered needs to set up EUS

## 2019-12-01 NOTE — Telephone Encounter (Signed)
The pt has been advised of the appt and lab order he was mailed a copy of the EUS instructions to his home

## 2019-12-01 NOTE — Telephone Encounter (Signed)
EUS scheduled for 12/30/19 at 730 am [facility] with Dr Ardis Hughs COVID test on 6/21 at 1030 am

## 2019-12-06 NOTE — Progress Notes (Signed)
Patient ID: Gary Mitchell, male   DOB: 1970-08-02, 49 y.o.   MRN: [id number]     Gary Mitchell, is a 49 y.o. male  [id number]  [id number]  DOB - [birth year]  Subjective:  Chief Complaint and HPI: Gary Mitchell is a 49 y.o. male here today to establish care and for a follow up visit After hospitalization 5/18-5/24/2021 for pancreatitis.  His appetite is improving.  He has not drank any alcohol.  He denies abdominal pain or fever.  He was able to get all his prescriptions and is taking them as directed.  He is aware of his follow-up appts.  No N/V/D.  BMs moving normally  From discharge summary: Tests Needing Follow-up: -recehck CBC, CMET, INR in f/u w/ GI team  -pt requires EUS eval of pancreatic mass w/ bx  Discharge Diagnoses: Acute alcohol-related pancreatitis Alcoholic hepatitis Cirrhosis of the liver  Hyponatremia Severe hypokalemia Hypomagnesemia Abnormal lesion in body of pancreas Cholelithiasis Alcoholism Anemia Alcohol related seizures  Initial presentation: 49 year old with a history of alcoholism and alcohol-related seizures who presented to the ED with abdominal pain nausea and vomiting x5 days. In the ED CT abdomen and pelvis noted findings consistent with pancreatitis as well as probable portal hypertension and cholelithiasis.  Hospital Course:  Acute alcohol-related pancreatitis Has been counseled on absolute need for alcohol abstinence and link between his acute illness and alcohol abuse -clinically much improved - tolerating low-fat diet at time of d/c   Alcoholic hepatitis Initial discriminant score 53 -steroid treatment initiated by GI 5/21 -total bilihas begun to trend downward - to be followed up in outpt setting by GI Team - to complete ~30 days of steroid tx   Cirrhosis of the liver  Due to heavy alcohol abuse -other factors being ruled out with serologic work-up - to f/u w/ GI in outpt setting for continued monitoring    Hyponatremia Due to alcoholism and cirrhosis - sodium stable at ~133 at time of d/c   Severe hypokalemia likely has significant total body deficit related to malnutrition of alcoholism - corrected to goal w/ high dose replacement   Hypomagnesemia Supplemented to goal w/ high dose replacement   Abnormal lesion in body of pancreas Will need eventual biopsy by EUS -GI now feels it is most appropriate to do this in the outpatient setting when the patient is given more time to recover from his acute pancreatitis and alcoholic hepatitis - GI to arrange f/u appointment   Cholelithiasis Not believed to be a significant contributor to his current acute illness  Alcoholism As above, has been counseled on absolute need to fully an completely abstain from further alcohol use  Anemia Due to above -no evidence of significant acute blood loss -follow trend  Alcohol related seizures Cont usual seizure meds for now - if pt continues to abstain these meds could likely be tapered to off in a stepwise fashion in 3-4 months  ED/Hospital notes reviewed.    ROS:   Constitutional:  No f/c, No night sweats, No unexplained weight loss. EENT:  No vision changes, No blurry vision, No hearing changes. No mouth, throat, or ear problems.  Respiratory: No cough, No SOB Cardiac: No CP, no palpitations GI:  No abd pain, No N/V/D. GU: No Urinary s/sx Musculoskeletal: No joint pain Neuro: No headache, no dizziness, no motor weakness.  Skin: No rash Endocrine:  No polydipsia. No polyuria.  Psych: Denies SI/HI  No problems updated.  ALLERGIES: Allergies  Allergen Reactions  . Iohexol Other (  See Comments)    Unknown reaction at 10 days old    PAST MEDICAL HISTORY: Past Medical History:  Diagnosis Date  . 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 Seizures ([name])     MEDICATIONS AT HOME: Prior to Admission medications   Medication Sig Start Date End Date Taking?  Authorizing Provider  pantoprazole (PROTONIX) 40 MG tablet Take 1 tablet (40 mg total) by mouth daily. 11/30/19  Yes Cherene Altes, MD  phenytoin (DILANTIN) 100 MG ER capsule Take 3 capsules (300 mg total) by mouth daily. 11/29/19  Yes Cherene Altes, MD  predniSONE (DELTASONE) 10 MG tablet Take 3 tablets (30 mg total) by mouth daily with breakfast for 21 days. 11/30/19 12/21/19 Yes Cherene Altes, MD  folic acid (FOLVITE) 1 MG tablet Take 1 tablet (1 mg total) by mouth daily. Patient not taking: Reported on 12/08/2019 11/30/19   Cherene Altes, MD  levETIRAcetam (KEPPRA) 500 MG tablet Take 1 tablet (500 mg total) by mouth 2 (two) times daily. Patient not taking: Reported on 12/08/2019 11/29/19   Cherene Altes, MD  Multiple Vitamin (MULTIVITAMIN WITH MINERALS) TABS tablet Take 1 tablet by mouth daily. Patient not taking: Reported on 12/08/2019 11/30/19   Cherene Altes, MD  predniSONE (DELTASONE) 10 MG tablet Take 1 tablet (10 mg total) by mouth daily with breakfast for 3 days. Patient not taking: Reported on 12/08/2019 12/25/19 12/28/19  Cherene Altes, MD  predniSONE (DELTASONE) 20 MG tablet Take 1 tablet (20 mg total) by mouth daily with breakfast for 4 days. Patient not taking: Reported on 12/08/2019 12/21/19 12/25/19  Cherene Altes, MD  thiamine 100 MG tablet Take 1 tablet (100 mg total) by mouth daily. Patient not taking: Reported on 12/08/2019 11/30/19   Cherene Altes, MD     Objective:  EXAM:   Vitals:   12/08/19 1005  BP: 124/80  Pulse: 99  SpO2: 99%  Weight: 156 lb 3.2 oz (70.9 kg)  Height: 5\' 4"  (1.626 m)    General appearance : A&OX3. NAD. Non-toxic-appearing; appears unhealthy HEENT: Atraumatic and Normocephalic.  PERRLA. EOM intact.   Neck: supple, no JVD. No cervical lymphadenopathy. No thyromegaly Chest/Lungs:  Breathing-non-labored, Good air entry bilaterally, breath sounds normal without rales, rhonchi, or wheezing  CVS: S1 S2 regular, no murmurs,  gallops, rubs  Abdomen: Bowel sounds present, Non tender and mild distention  with no gaurding, rigidity or rebound. Extremities: Bilateral Lower Ext shows <1+ edema edema, both legs are warm to touch with = pulse throughout Neurology:  CN II-XII grossly intact, Non focal.   Psych:  TP linear. J/I WNL. Normal speech. Appropriate eye contact and affect.  Skin:  No Rash  Data Review Lab Results  Component Value Date   HGBA1C 4.3 (L) 11/28/2019     Assessment & Plan   1. Pancreatic mass Keep GI f/up and biopsy is scheduled - Comprehensive metabolic panel - Lipase  2. Alcoholic hepatitis without ascites I have counseled the patient at length about substance abuse and addiction.  12 step meetings/recovery recommended.  Local 12 step meeting lists were given and attendance was encouraged.  Patient expresses understanding.  - Comprehensive metabolic panel - CBC with Differential/Platelet  3. Hyponatremia - Comprehensive metabolic panel  4. Hypokalemia - Comprehensive metabolic panel  5. Hospital discharge follow-up Improving overall  6. Anemia, unspecified type - CBC with Differential/Platelet  7. Edema, unspecified type Very minimal currently Compression stockings  and edema instructions   Patient have been counseled extensively about nutrition and exercise  Return in about 1 month (around 01/07/2020) for assign PCP.  The patient was given clear instructions to go to ER or return to medical center if symptoms don't improve, worsen or new problems develop. The patient verbalized understanding. The patient was told to call to get lab results if they haven't heard anything in the next week.     Freeman Caldron, PA-C [facility] and [facility] [place], [place]   12/08/2019, 10:15 AM

## 2019-12-08 ENCOUNTER — Other Ambulatory Visit (INDEPENDENT_AMBULATORY_CARE_PROVIDER_SITE_OTHER): Payer: Self-pay

## 2019-12-08 ENCOUNTER — Other Ambulatory Visit: Payer: Self-pay

## 2019-12-08 ENCOUNTER — Ambulatory Visit: Payer: Self-pay | Attending: Critical Care Medicine | Admitting: Physician Assistant

## 2019-12-08 VITALS — BP 124/80 | HR 99 | Ht 64.0 in | Wt 156.2 lb

## 2019-12-08 DIAGNOSIS — K8689 Other specified diseases of pancreas: Secondary | ICD-10-CM

## 2019-12-08 DIAGNOSIS — R609 Edema, unspecified: Secondary | ICD-10-CM

## 2019-12-08 DIAGNOSIS — Z09 Encounter for follow-up examination after completed treatment for conditions other than malignant neoplasm: Secondary | ICD-10-CM

## 2019-12-08 DIAGNOSIS — K859 Acute pancreatitis without necrosis or infection, unspecified: Secondary | ICD-10-CM

## 2019-12-08 DIAGNOSIS — E876 Hypokalemia: Secondary | ICD-10-CM

## 2019-12-08 DIAGNOSIS — D649 Anemia, unspecified: Secondary | ICD-10-CM

## 2019-12-08 DIAGNOSIS — E871 Hypo-osmolality and hyponatremia: Secondary | ICD-10-CM

## 2019-12-08 DIAGNOSIS — K7010 Alcoholic hepatitis without ascites: Secondary | ICD-10-CM

## 2019-12-08 LAB — CBC WITH DIFFERENTIAL/PLATELET
Basophils Relative: 0 % (ref 0.0–3.0)
Eosinophils Relative: 0 % (ref 0.0–5.0)
HCT: 33 % — ABNORMAL LOW (ref 39.0–52.0)
Hemoglobin: 10.9 g/dL — ABNORMAL LOW (ref 13.0–17.0)
Lymphocytes Relative: 10 % — ABNORMAL LOW (ref 12.0–46.0)
MCHC: 33.2 g/dL (ref 30.0–36.0)
MCV: 95.3 fl (ref 78.0–100.0)
Monocytes Relative: 2 % — ABNORMAL LOW (ref 3.0–12.0)
Neutrophils Relative %: 88 % — ABNORMAL HIGH (ref 43.0–77.0)
Platelets: 302 10*3/uL (ref 150.0–400.0)
RBC: 3.46 Mil/uL — ABNORMAL LOW (ref 4.22–5.81)
RDW: 16.4 % — ABNORMAL HIGH (ref 11.5–15.5)
WBC: 19.7 10*3/uL (ref 4.0–10.5)

## 2019-12-08 LAB — COMPREHENSIVE METABOLIC PANEL
ALT: 33 U/L (ref 0–53)
AST: 63 U/L — ABNORMAL HIGH (ref 0–37)
Albumin: 3.6 g/dL (ref 3.5–5.2)
Alkaline Phosphatase: 256 U/L — ABNORMAL HIGH (ref 39–117)
BUN: 6 mg/dL (ref 6–23)
CO2: 26 mEq/L (ref 19–32)
Calcium: 9.2 mg/dL (ref 8.4–10.5)
Chloride: 94 mEq/L — ABNORMAL LOW (ref 96–112)
Creatinine, Ser: 0.49 mg/dL (ref 0.40–1.50)
GFR: 220.7 mL/min (ref >60.00)
Glucose, Bld: 264 mg/dL — ABNORMAL HIGH (ref 70–99)
Potassium: 4.1 mEq/L (ref 3.5–5.1)
Sodium: 128 mEq/L — ABNORMAL LOW (ref 135–145)
Total Bilirubin: 11.8 mg/dL — ABNORMAL HIGH (ref 0.2–1.2)
Total Protein: 7.8 g/dL (ref 6.0–8.3)

## 2019-12-08 LAB — PROTIME-INR
INR: 1.8 ratio — ABNORMAL HIGH (ref 0.8–1.0)
Prothrombin Time: 19.9 s — ABNORMAL HIGH (ref 9.6–13.1)

## 2019-12-08 NOTE — Patient Instructions (Addendum)
FactoringRate.ca is the website for alcoholics anonymous  123XX123 is the [place] phone number for AA   Edema  Edema is when you have too much fluid in your body or under your skin. Edema may make your legs, feet, and ankles swell up. Swelling is also common in looser tissues, like around your eyes. This is a common condition. It gets more common as you get older. There are many possible causes of edema. Eating too much salt (sodium) and being on your feet or sitting for a long time can cause edema in your legs, feet, and ankles. Hot weather may make edema worse. Edema is usually painless. Your skin may look swollen or shiny. Follow these instructions at home:  Keep the swollen body part raised (elevated) above the level of your heart when you are sitting or lying down.  Do not sit still or stand for a long time.  Do not wear tight clothes. Do not wear garters on your upper legs.  Exercise your legs. This can help the swelling go down.  Wear elastic bandages or support stockings as told by your doctor.  Eat a low-salt (low-sodium) diet to reduce fluid as told by your doctor.  Depending on the cause of your swelling, you may need to limit how much fluid you drink (fluid restriction).  Take over-the-counter and prescription medicines only as told by your doctor. Contact a doctor if:  Treatment is not working.  You have heart, liver, or kidney disease and have symptoms of edema.  You have sudden and unexplained weight gain. Get help right away if:  You have shortness of breath or chest pain.  You cannot breathe when you lie down.  You have pain, redness, or warmth in the swollen areas.  You have heart, liver, or kidney disease and get edema all of a sudden.  You have a fever and your symptoms get worse all of a sudden. Summary  Edema is when you have too much fluid in your body or under your skin.  Edema may make your legs, feet, and ankles swell up. Swelling is also  common in looser tissues, like around your eyes.  Raise (elevate) the swollen body part above the level of your heart when you are sitting or lying down.  Follow your doctor's instructions about diet and how much fluid you can drink (fluid restriction). This information is not intended to replace advice given to you by your health care provider. Make sure you discuss any questions you have with your health care provider. Document Revised: 06/27/2017 Document Reviewed: 07/12/2016 Elsevier Patient Education  2020 [employer].

## 2019-12-09 LAB — COMPREHENSIVE METABOLIC PANEL
ALT: 35 IU/L (ref 0–44)
AST: 69 IU/L — ABNORMAL HIGH (ref 0–40)
Albumin/Globulin Ratio: 0.9 — ABNORMAL LOW (ref 1.2–2.2)
Albumin: 3.6 g/dL — ABNORMAL LOW (ref 4.0–5.0)
Alkaline Phosphatase: 286 IU/L — ABNORMAL HIGH (ref 48–121)
BUN/Creatinine Ratio: 9 (ref 9–20)
BUN: 5 mg/dL — ABNORMAL LOW (ref 6–24)
Bilirubin Total: 10.6 mg/dL — ABNORMAL HIGH (ref 0.0–1.2)
CO2: 22 mmol/L (ref 20–29)
Calcium: 9.2 mg/dL (ref 8.7–10.2)
Chloride: 96 mmol/L (ref 96–106)
Creatinine, Ser: 0.53 mg/dL — ABNORMAL LOW (ref 0.76–1.27)
GFR calc Af Amer: 146 mL/min/{1.73_m2} (ref >59)
GFR calc non Af Amer: 126 mL/min/{1.73_m2} (ref >59)
Globulin, Total: 4 g/dL (ref 1.5–4.5)
Glucose: 261 mg/dL — ABNORMAL HIGH (ref 65–99)
Potassium: 4.4 mmol/L (ref 3.5–5.2)
Sodium: 132 mmol/L — ABNORMAL LOW (ref 134–144)
Total Protein: 7.6 g/dL (ref 6.0–8.5)

## 2019-12-09 LAB — CBC WITH DIFFERENTIAL/PLATELET
Basophils Absolute: 0.1 10*3/uL (ref 0.0–0.2)
Basos: 0 %
EOS (ABSOLUTE): 0 10*3/uL (ref 0.0–0.4)
Eos: 0 %
Hematocrit: 27.7 % — ABNORMAL LOW (ref 37.5–51.0)
Hemoglobin: 10.4 g/dL — ABNORMAL LOW (ref 13.0–17.7)
Immature Grans (Abs): 0.1 10*3/uL (ref 0.0–0.1)
Immature Granulocytes: 1 %
Lymphocytes Absolute: 1.1 10*3/uL (ref 0.7–3.1)
Lymphs: 5 %
MCH: 31.7 pg (ref 26.6–33.0)
MCHC: 37.5 g/dL — ABNORMAL HIGH (ref 31.5–35.7)
MCV: 85 fL (ref 79–97)
Monocytes Absolute: 1.1 10*3/uL — ABNORMAL HIGH (ref 0.1–0.9)
Monocytes: 5 %
Neutrophils Absolute: 19.1 10*3/uL — ABNORMAL HIGH (ref 1.4–7.0)
Neutrophils: 89 %
Platelets: 274 10*3/uL (ref 150–450)
RBC: 3.28 x10E6/uL — ABNORMAL LOW (ref 4.14–5.80)
RDW: 14.8 % (ref 11.6–15.4)
WBC: 21.4 10*3/uL (ref 3.4–10.8)

## 2019-12-09 LAB — LIPASE: Lipase: 12 U/L — ABNORMAL LOW (ref 13–78)

## 2019-12-17 ENCOUNTER — Other Ambulatory Visit (INDEPENDENT_AMBULATORY_CARE_PROVIDER_SITE_OTHER): Payer: Self-pay

## 2019-12-17 DIAGNOSIS — K859 Acute pancreatitis without necrosis or infection, unspecified: Secondary | ICD-10-CM

## 2019-12-17 DIAGNOSIS — K8689 Other specified diseases of pancreas: Secondary | ICD-10-CM

## 2019-12-17 LAB — COMPREHENSIVE METABOLIC PANEL
ALT: 20 U/L (ref 0–53)
AST: 41 U/L — ABNORMAL HIGH (ref 0–37)
Albumin: 3.5 g/dL (ref 3.5–5.2)
Alkaline Phosphatase: 226 U/L — ABNORMAL HIGH (ref 39–117)
BUN: 11 mg/dL (ref 6–23)
CO2: 22 mEq/L (ref 19–32)
Calcium: 8.9 mg/dL (ref 8.4–10.5)
Chloride: 99 mEq/L (ref 96–112)
Creatinine, Ser: 0.48 mg/dL (ref 0.40–1.50)
GFR: 225.99 mL/min (ref >60.00)
Glucose, Bld: 383 mg/dL — ABNORMAL HIGH (ref 70–99)
Potassium: 4.4 mEq/L (ref 3.5–5.1)
Sodium: 129 mEq/L — ABNORMAL LOW (ref 135–145)
Total Bilirubin: 6.6 mg/dL — ABNORMAL HIGH (ref 0.2–1.2)
Total Protein: 7.6 g/dL (ref 6.0–8.3)

## 2019-12-17 LAB — CBC WITH DIFFERENTIAL/PLATELET
Basophils Absolute: 0.1 10*3/uL (ref 0.0–0.1)
Basophils Relative: 0.4 % (ref 0.0–3.0)
Eosinophils Absolute: 0 10*3/uL (ref 0.0–0.7)
Eosinophils Relative: 0.1 % (ref 0.0–5.0)
HCT: 32.9 % — ABNORMAL LOW (ref 39.0–52.0)
Hemoglobin: 10.9 g/dL — ABNORMAL LOW (ref 13.0–17.0)
Lymphocytes Relative: 6 % — ABNORMAL LOW (ref 12.0–46.0)
Lymphs Abs: 0.9 10*3/uL (ref 0.7–4.0)
MCHC: 33.1 g/dL (ref 30.0–36.0)
MCV: 94 fl (ref 78.0–100.0)
Monocytes Absolute: 0.4 10*3/uL (ref 0.1–1.0)
Monocytes Relative: 2.7 % — ABNORMAL LOW (ref 3.0–12.0)
Neutro Abs: 13.7 10*3/uL — ABNORMAL HIGH (ref 1.4–7.7)
Neutrophils Relative %: 90.8 % — ABNORMAL HIGH (ref 43.0–77.0)
Platelets: 204 10*3/uL (ref 150.0–400.0)
RBC: 3.49 Mil/uL — ABNORMAL LOW (ref 4.22–5.81)
RDW: 15.8 % — ABNORMAL HIGH (ref 11.5–15.5)
WBC: 15.1 10*3/uL — ABNORMAL HIGH (ref 4.0–10.5)

## 2019-12-17 LAB — PROTIME-INR
INR: 1.9 ratio — ABNORMAL HIGH (ref 0.8–1.0)
Prothrombin Time: 21.4 s — ABNORMAL HIGH (ref 9.6–13.1)

## 2019-12-20 ENCOUNTER — Other Ambulatory Visit: Payer: Self-pay

## 2019-12-20 DIAGNOSIS — K859 Acute pancreatitis without necrosis or infection, unspecified: Secondary | ICD-10-CM

## 2019-12-20 DIAGNOSIS — K8689 Other specified diseases of pancreas: Secondary | ICD-10-CM

## 2019-12-24 ENCOUNTER — Telehealth: Payer: Self-pay

## 2019-12-24 ENCOUNTER — Other Ambulatory Visit (INDEPENDENT_AMBULATORY_CARE_PROVIDER_SITE_OTHER): Payer: Self-pay

## 2019-12-24 DIAGNOSIS — K859 Acute pancreatitis without necrosis or infection, unspecified: Secondary | ICD-10-CM

## 2019-12-24 DIAGNOSIS — K8689 Other specified diseases of pancreas: Secondary | ICD-10-CM

## 2019-12-24 LAB — CBC WITH DIFFERENTIAL/PLATELET
Basophils Absolute: 0.1 10*3/uL (ref 0.0–0.1)
Basophils Relative: 0.8 % (ref 0.0–3.0)
Eosinophils Absolute: 0 10*3/uL (ref 0.0–0.7)
Eosinophils Relative: 0 % (ref 0.0–5.0)
HCT: 34.5 % — ABNORMAL LOW (ref 39.0–52.0)
Hemoglobin: 11.4 g/dL — ABNORMAL LOW (ref 13.0–17.0)
Lymphocytes Relative: 5.6 % — ABNORMAL LOW (ref 12.0–46.0)
Lymphs Abs: 0.8 10*3/uL (ref 0.7–4.0)
MCHC: 32.9 g/dL (ref 30.0–36.0)
MCV: 92.7 fl (ref 78.0–100.0)
Monocytes Absolute: 0.4 10*3/uL (ref 0.1–1.0)
Monocytes Relative: 2.7 % — ABNORMAL LOW (ref 3.0–12.0)
Neutro Abs: 13.4 10*3/uL — ABNORMAL HIGH (ref 1.4–7.7)
Neutrophils Relative %: 90.9 % — ABNORMAL HIGH (ref 43.0–77.0)
Platelets: 236 10*3/uL (ref 150.0–400.0)
RBC: 3.72 Mil/uL — ABNORMAL LOW (ref 4.22–5.81)
RDW: 15.5 % (ref 11.5–15.5)
WBC: 14.8 10*3/uL — ABNORMAL HIGH (ref 4.0–10.5)

## 2019-12-24 LAB — COMPREHENSIVE METABOLIC PANEL
ALT: 21 U/L (ref 0–53)
AST: 48 U/L — ABNORMAL HIGH (ref 0–37)
Albumin: 3.8 g/dL (ref 3.5–5.2)
Alkaline Phosphatase: 285 U/L — ABNORMAL HIGH (ref 39–117)
BUN: 10 mg/dL (ref 6–23)
CO2: 24 mEq/L (ref 19–32)
Calcium: 9.3 mg/dL (ref 8.4–10.5)
Chloride: 95 mEq/L — ABNORMAL LOW (ref 96–112)
Creatinine, Ser: 0.63 mg/dL (ref 0.40–1.50)
GFR: 165.11 mL/min (ref >60.00)
Glucose, Bld: 549 mg/dL (ref 70–99)
Potassium: 5.1 mEq/L (ref 3.5–5.1)
Sodium: 129 mEq/L — ABNORMAL LOW (ref 135–145)
Total Bilirubin: 4.9 mg/dL — ABNORMAL HIGH (ref 0.2–1.2)
Total Protein: 8.7 g/dL — ABNORMAL HIGH (ref 6.0–8.3)

## 2019-12-24 LAB — PROTIME-INR
INR: 1.7 ratio — ABNORMAL HIGH (ref 0.8–1.0)
Prothrombin Time: 19.3 s — ABNORMAL HIGH (ref 9.6–13.1)

## 2019-12-24 NOTE — Telephone Encounter (Signed)
Received critical glucose on the pt of 549.  I called the pt and he states he is not diabetic but is currently on prednisone.  I asked why he is taking prednisone but he told he "I have no idea why or who put me on it."  I asked who his PCP is and he tells me he does not have one.  I asked if he follows up with any provider and he said no.  I advised he needs to be evaluated at an urgent care or ED.  FYI Dr Ardis Hughs

## 2019-12-24 NOTE — Telephone Encounter (Signed)
defintiely refer to urgent clinic or ER.   Prednisone is for his recent severe acute etoh hepatitis. He needs to start tapering today (by 5mg  every day until he is completely off of it.  Thanks).

## 2019-12-24 NOTE — Telephone Encounter (Signed)
Left message on machine to call back  

## 2019-12-27 ENCOUNTER — Other Ambulatory Visit: Payer: Self-pay

## 2019-12-27 ENCOUNTER — Other Ambulatory Visit (HOSPITAL_COMMUNITY): Payer: Self-pay

## 2019-12-27 ENCOUNTER — Other Ambulatory Visit (INDEPENDENT_AMBULATORY_CARE_PROVIDER_SITE_OTHER): Payer: Self-pay

## 2019-12-27 DIAGNOSIS — K859 Acute pancreatitis without necrosis or infection, unspecified: Secondary | ICD-10-CM

## 2019-12-27 DIAGNOSIS — K8689 Other specified diseases of pancreas: Secondary | ICD-10-CM

## 2019-12-27 DIAGNOSIS — R899 Unspecified abnormal finding in specimens from other organs, systems and tissues: Secondary | ICD-10-CM

## 2019-12-27 DIAGNOSIS — K852 Alcohol induced acute pancreatitis without necrosis or infection: Secondary | ICD-10-CM

## 2019-12-27 LAB — COMPREHENSIVE METABOLIC PANEL
ALT: 16 U/L (ref 0–53)
AST: 39 U/L — ABNORMAL HIGH (ref 0–37)
Albumin: 3.6 g/dL (ref 3.5–5.2)
Alkaline Phosphatase: 254 U/L — ABNORMAL HIGH (ref 39–117)
BUN: 7 mg/dL (ref 6–23)
CO2: 23 mEq/L (ref 19–32)
Calcium: 8.8 mg/dL (ref 8.4–10.5)
Chloride: 96 mEq/L (ref 96–112)
Creatinine, Ser: 0.44 mg/dL (ref 0.40–1.50)
GFR: 249.83 mL/min (ref >60.00)
Glucose, Bld: 278 mg/dL — ABNORMAL HIGH (ref 70–99)
Potassium: 3.7 mEq/L (ref 3.5–5.1)
Sodium: 128 mEq/L — ABNORMAL LOW (ref 135–145)
Total Bilirubin: 3.7 mg/dL — ABNORMAL HIGH (ref 0.2–1.2)
Total Protein: 8 g/dL (ref 6.0–8.3)

## 2019-12-27 LAB — PROTIME-INR
INR: 1.7 ratio — ABNORMAL HIGH (ref 0.8–1.0)
Prothrombin Time: 19.3 s — ABNORMAL HIGH (ref 9.6–13.1)

## 2019-12-27 LAB — CBC WITH DIFFERENTIAL/PLATELET
Basophils Absolute: 0.4 10*3/uL — ABNORMAL HIGH (ref 0.0–0.1)
Basophils Relative: 2.4 % (ref 0.0–3.0)
Eosinophils Absolute: 0.1 10*3/uL (ref 0.0–0.7)
Eosinophils Relative: 0.6 % (ref 0.0–5.0)
HCT: 32.8 % — ABNORMAL LOW (ref 39.0–52.0)
Hemoglobin: 11 g/dL — ABNORMAL LOW (ref 13.0–17.0)
Lymphocytes Relative: 17.2 % (ref 12.0–46.0)
Lymphs Abs: 2.5 10*3/uL (ref 0.7–4.0)
MCHC: 33.4 g/dL (ref 30.0–36.0)
MCV: 91.2 fl (ref 78.0–100.0)
Monocytes Absolute: 1.2 10*3/uL — ABNORMAL HIGH (ref 0.1–1.0)
Monocytes Relative: 8.3 % (ref 3.0–12.0)
Neutro Abs: 10.4 10*3/uL — ABNORMAL HIGH (ref 1.4–7.7)
Neutrophils Relative %: 71.5 % (ref 43.0–77.0)
Platelets: 193 10*3/uL (ref 150.0–400.0)
RBC: 3.6 Mil/uL — ABNORMAL LOW (ref 4.22–5.81)
RDW: 15.1 % (ref 11.5–15.5)
WBC: 14.5 10*3/uL — ABNORMAL HIGH (ref 4.0–10.5)

## 2019-12-27 MED ORDER — PHYTONADIONE 5 MG PO TABS: 10.0000 mg | ORAL_TABLET | Freq: Every day | ORAL | 0 refills | Status: DC

## 2019-12-27 MED FILL — levETIRAcetam 500 MG TABS: 500 | 30 days supply | Qty: 60 | Fill #0

## 2019-12-27 MED FILL — PANTOPRAZOLE SOD DR 40 MG T: 40 | 30 days supply | Qty: 30 | Fill #0

## 2019-12-27 MED FILL — PHENYTOIN SOD EXT 100 MG CA: 100 | 30 days supply | Qty: 90 | Fill #0

## 2019-12-28 ENCOUNTER — Telehealth: Payer: Self-pay | Admitting: Gastroenterology

## 2019-12-28 MED ORDER — PHYTONADIONE 5 MG PO TABS: 10.0000 mg | ORAL_TABLET | Freq: Every day | ORAL | 0 refills | Status: DC

## 2019-12-28 NOTE — Telephone Encounter (Signed)
They don't have Vitamin K that was sent to them requesting an alternative to be sent

## 2019-12-28 NOTE — Telephone Encounter (Signed)
The pt prescription has been sent to [facility] per pt request.

## 2019-12-29 NOTE — Telephone Encounter (Signed)
Patty, That is quite expensive. I recommend we do the over the counter Vitamin K2. It will not be as potent but it is worth trying. Can be bought over the counter and is usually 100 mcg. He can take 1 pill daily and if no issues taking then would continue as a daily supplement or at least until 30-60 days supply is complete. Thanks. GM

## 2019-12-29 NOTE — Telephone Encounter (Signed)
Dr Rush Landmark the pt can not afford VIT K please advise

## 2019-12-30 ENCOUNTER — Ambulatory Visit (HOSPITAL_COMMUNITY): Admit: 2019-12-30 | Payer: Self-pay | Admitting: Gastroenterology

## 2019-12-30 ENCOUNTER — Encounter (HOSPITAL_COMMUNITY): Payer: Self-pay

## 2019-12-30 SURGERY — UPPER ENDOSCOPIC ULTRASOUND (EUS) RADIAL
Anesthesia: Monitor Anesthesia Care

## 2019-12-30 NOTE — Telephone Encounter (Signed)
The pt has been advised and will try the OTC and call back with any further concerns

## 2019-12-31 ENCOUNTER — Encounter: Payer: Self-pay | Admitting: Gastroenterology

## 2019-12-31 ENCOUNTER — Ambulatory Visit (INDEPENDENT_AMBULATORY_CARE_PROVIDER_SITE_OTHER): Payer: Self-pay | Admitting: Gastroenterology

## 2019-12-31 VITALS — BP 124/88 | HR 98 | Ht 66.25 in | Wt 153.1 lb

## 2019-12-31 DIAGNOSIS — Q453 Other congenital malformations of pancreas and pancreatic duct: Secondary | ICD-10-CM

## 2019-12-31 DIAGNOSIS — K709 Alcoholic liver disease, unspecified: Secondary | ICD-10-CM

## 2019-12-31 NOTE — Progress Notes (Signed)
Review of pertinent gastrointestinal problems: 1.  Alcoholic liver disease; presented with severe alcoholic hepatitis and also alcoholic pancreatitis in the setting of underlying cirrhosis May 202.   MRI May 2021 showed abnormal liver contour, cirrhotic changes of the liver, innumerable small regenerating nodules throughout the liver, evidence of portal hypertension as well with portal venous collaterals.  Presenting total bilirubin 35, AST/ALT ratio greater than 2 1, INR 1.5; discriminant function was high enough that he qualified for oral prednisolone 4-week course  2.  Abnormal pancreas.  Presented with acute pancreatitis at the same time as his severe alcoholic hepatitis in May 2021.  Imaging while he was in the hospital also suggested "4 cm ill-defined infiltrating pancreatic body lesion worrisome for infiltrating neoplasm/adenocarcinoma.  This is associated with marked atrophy of the pancreatic tail and dilation of the main pancreatic duct"   HPI: This is a very pleasant 49 year old man  I had originally planned for outpatient endoscopic ultrasound of the abnormal area of his pancreas yesterday.  I had increasing concerns about his metabolic stability as I followed his blood work serially since his discharge.  Most recently total bilirubin 3.7, alk phos 254, AST 39 his glucose was 549 and then 278, his sodium was 128 and his INR was 1.7.  Today he tells me he is off of his prednisone as of 3 to 4 days ago.  I called in a prescription for vitamin K for him to try to help correct his INR a bit more in case he does need eventual endoscopic ultrasound and FNA.  He ended up buying of vitamin K to over-the-counter medicine that I am not sure is adequate.  He is still drinking periodically.  He tells me he had a beer, 1 beer about a week ago.  Turns out it was a 40 ounce beer.  He has no abdominal pains.  His abdomen is felt fine since he left the hospital about a month ago.   ROS: complete GI  ROS as described in HPI, all other review negative.  Constitutional:  No unintentional weight loss   Past Medical History:  Diagnosis Date  . 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 Seizures ([place])     Past Surgical History:  Procedure Laterality Date  . left kidney removed      Current Outpatient Medications  Medication Sig Dispense Refill  . levETIRAcetam (KEPPRA) 500 MG tablet Take 1 tablet (500 mg total) by mouth 2 (two) times daily. 60 tablet 1  . pantoprazole (PROTONIX) 40 MG tablet Take 1 tablet (40 mg total) by mouth daily. 30 tablet 1  . phenytoin (DILANTIN) 100 MG ER capsule Take 3 capsules (300 mg total) by mouth daily. 45 capsule 1  . vitamin k 100 MCG tablet Take 100 mcg by mouth daily. For 60 days per patient     No current facility-administered medications for this visit.    Allergies as of 12/31/2019 - Review Complete 12/31/2019  Allergen Reaction Noted  . Iohexol Other (See Comments) 05/21/2016    Family History  Problem Relation Age of Onset  . Diabetes Mellitus II Mother   . Colon cancer Neg Hx   . Esophageal cancer Neg Hx     Social History   Socioeconomic History  . Marital status: Single    Spouse name: Not on file  . Number of children: Not on file  . Years of education: Not on file  . Highest education level:  Not on file  Occupational History  . Not on file  Tobacco Use  . Smoking status: Current Every Day Smoker    Packs/day: 2.00    Years: 25.00    Pack years: 50.00  . Smokeless tobacco: Never Used  Vaping Use  . Vaping Use: Never used  Substance and Sexual Activity  . Alcohol use: Not Currently    Alcohol/week: 3.0 standard drinks    Types: 3 Cans of beer per week  . Drug use: Not Currently    Types: Marijuana  . Sexual activity: Not on file  Other Topics Concern  . Not on file  Social History Narrative  . Not on file   Social Determinants of Health   Financial Resource Strain:   . Difficulty of  Paying Living Expenses:   Food Insecurity:   . Worried About Charity fundraiser in the Last Year:   . Arboriculturist in the Last Year:   Transportation Needs:   . Film/video editor (Medical):   Marland Kitchen Lack of Transportation (Non-Medical):   Physical Activity:   . Days of Exercise per Week:   . Minutes of Exercise per Session:   Stress:   . Feeling of Stress :   Social Connections:   . Frequency of Communication with Friends and Family:   . Frequency of Social Gatherings with Friends and Family:   . Attends Religious Services:   . Active Member of Clubs or Organizations:   . Attends Archivist Meetings:   Marland Kitchen Marital Status:   Intimate Partner Violence:   . Fear of Current or Ex-Partner:   . Emotionally Abused:   Marland Kitchen Physically Abused:   . Sexually Abused:      Physical Exam: BP 124/88   Pulse 98   Ht 5' 6.25" (1.683 m) Comment: with shoes  Wt 153 lb 2 oz (69.5 kg)   BMI 24.53 kg/m  Constitutional: generally well-appearing Psychiatric: alert and oriented x3 Abdomen: soft, nontender, nondistended, no obvious ascites, no peritoneal signs, normal bowel sounds No peripheral edema noted in lower extremities  Assessment and plan: 49 y.o. male with alcoholic liver disease, abnormal pancreas  He clearly has cirrhosis and he is recovering from severe acute alcoholic hepatitis and mild acute alcohol-related pancreatitis.  He drank a 40 ounce beer or at least admitted to drinking 1 of these a week or so ago.  Obviously he might be drinking more but I hope not.  I tried to educate him about that.  The MRI which he had when he was hospitalized suggested a pancreatic mass.  He does not really appear clinically to have pancreatic cancer at all.  He has no abdominal pains and his weight is overall stable.  Before recommitting to endoscopic ultrasound with fine-needle aspiration given his metabolic instability and still recovering from his severe acute alcoholic hepatitis I ordered  a repeat MRI of his pancreas.  He is getting that done tomorrow.  After I see the results of that we will make plans.  Please see the "Patient Instructions" section for addition details about the plan.  Owens Loffler, MD [facility] 12/31/2019, 3:19 PM   Total time on date of encounter was 30 minutes (this included time spent preparing to see the patient reviewing records; obtaining and/or reviewing separately obtained history; performing a medically appropriate exam and/or evaluation; counseling and educating the patient and family if present; ordering medications, tests or procedures if applicable; and documenting clinical information in the health  record).

## 2019-12-31 NOTE — Patient Instructions (Addendum)
If you are age 47 or older, your body mass index should be between 23-30. Your Body mass index is 24.53 kg/m. If this is out of the aforementioned range listed, please consider follow up with your Primary Care Provider.  If you are age 9 or younger, your body mass index should be between 19-25. Your Body mass index is 24.53 kg/m. If this is out of the aformentioned range listed, please consider follow up with your Primary Care Provider.   You will follow up with our office depending on the results of your MRI scheduled on 01-01-20 at 8:00am at [facility].  Thank you for entrusting me with your care and choosing [facility].  Dr Ardis Hughs

## 2020-01-01 ENCOUNTER — Ambulatory Visit (HOSPITAL_BASED_OUTPATIENT_CLINIC_OR_DEPARTMENT_OTHER)
Admission: RE | Admit: 2020-01-01 | Discharge: 2020-01-01 | Disposition: A | Discharge status: Home / Self Care | Payer: Self-pay | Source: Ambulatory Visit | Attending: Gastroenterology | Admitting: Gastroenterology

## 2020-01-01 ENCOUNTER — Other Ambulatory Visit: Payer: Self-pay

## 2020-01-01 DIAGNOSIS — K852 Alcohol induced acute pancreatitis without necrosis or infection: Secondary | ICD-10-CM | POA: Insufficient documentation

## 2020-01-01 MED ORDER — GADOBUTROL 1 MMOL/ML IV SOLN
6.9000 mL | Freq: Once | INTRAVENOUS | Status: AC | PRN
  Administered 2020-01-01: 6.9 mL via INTRAVENOUS

## 2020-01-03 ENCOUNTER — Other Ambulatory Visit: Payer: Self-pay

## 2020-01-03 DIAGNOSIS — K709 Alcoholic liver disease, unspecified: Secondary | ICD-10-CM

## 2020-01-12 ENCOUNTER — Other Ambulatory Visit: Payer: Self-pay

## 2020-01-12 ENCOUNTER — Ambulatory Visit: Payer: Self-pay | Attending: Family Medicine | Admitting: Family Medicine

## 2020-01-12 ENCOUNTER — Encounter: Payer: Self-pay | Admitting: Family Medicine

## 2020-01-12 VITALS — BP 126/80 | HR 98 | Ht 66.0 in | Wt 153.0 lb

## 2020-01-12 DIAGNOSIS — R569 Unspecified convulsions: Secondary | ICD-10-CM

## 2020-01-12 DIAGNOSIS — K7010 Alcoholic hepatitis without ascites: Secondary | ICD-10-CM

## 2020-01-12 DIAGNOSIS — K709 Alcoholic liver disease, unspecified: Secondary | ICD-10-CM

## 2020-01-12 MED ORDER — PANTOPRAZOLE SODIUM 40 MG PO TBEC: 40.0000 mg | DELAYED_RELEASE_TABLET | Freq: Every day | ORAL | 3 refills | Status: DC

## 2020-01-12 MED ORDER — NALTREXONE HCL 50 MG PO TABS: 50.0000 mg | ORAL_TABLET | Freq: Every day | ORAL | 3 refills | Status: DC

## 2020-01-12 MED ORDER — LEVETIRACETAM 500 MG PO TABS: 500.0000 mg | ORAL_TABLET | Freq: Two times a day (BID) | ORAL | 3 refills | Status: DC

## 2020-01-12 MED FILL — NALTREXONE 50 MG TABLET: 50 | 30 days supply | Qty: 30 | Fill #0

## 2020-01-12 NOTE — Progress Notes (Signed)
Subjective:  Patient ID: Gary Mitchell, male    DOB: 03/29/1971  Age: 49 y.o. MRN: [id number]  CC: Pancreatitis   HPI Gary Mitchell is a 49 year old male with a history of alcoholic liver disease hospitalized for acute pancreatitis and alcoholic hepatitis in 0/8144.  During his hospital course he was also placed on Keppra and phenytoin for alcohol-related seizures, alcoholic hepatitis was treated with steroids for a 30-day course. Since discharge she has been to see GI and has had repeat MRI of the abdomen which revealed: IMPRESSION: 1. A previously described masslike lesion in the pancreatic body and proximal tail is less clearly appreciated on current examination, with a preponderance of heterogeneous fluid signal in this area and increased dilatation of the pancreatic duct distally. New, circumscribed appearing fluid collection anterior to the pancreatic body and adjacent to the lesser curvature of the stomach measuring 3.7 cm. Findings are most consistent with evolution of pancreatitis and development of a new pseudocyst. There are no definitive findings of malignancy, although underlying malignancy would be difficult to strictly exclude given course of findings. 2. Heterogeneous parenchymal enhancement of the liver without focal lesion or suspicious hyperenhancement. This is in keeping with reported history of cirrhosis. There are no overt morphologic stigmata of cirrhosis. 3. Status post left nephrectomy.   He is unsure if he has had a seizure recently as he states he wouldn't know but he lives with his mum and has been compliant with Keppra and Phenytoin. He denies presence of bruising or bleeding and has no abdominal pain. Last alcohol intake was 2 weeks ago; he is working on quitting on his own but is open to trying a medication to reduce craving. Denies acute concerns today  Past Medical History:  Diagnosis Date  . Renal disorder    states kidney removal when he  was a baby  . Seizures ([place])     Past Surgical History:  Procedure Laterality Date  . left kidney removed      Family History  Problem Relation Age of Onset  . Diabetes Mellitus II Mother   . Colon cancer Neg Hx   . Esophageal cancer Neg Hx     Allergies  Allergen Reactions  . Iohexol Other (See Comments)    Unknown reaction at 45 days old    Outpatient Medications Prior to Visit  Medication Sig Dispense Refill  . levETIRAcetam (KEPPRA) 500 MG tablet Take 1 tablet (500 mg total) by mouth 2 (two) times daily. 60 tablet 1  . pantoprazole (PROTONIX) 40 MG tablet Take 1 tablet (40 mg total) by mouth daily. 30 tablet 1  . phenytoin (DILANTIN) 100 MG ER capsule Take 3 capsules (300 mg total) by mouth daily. 45 capsule 1  . vitamin k 100 MCG tablet Take 100 mcg by mouth daily. For 60 days per patient     No facility-administered medications prior to visit.     ROS Review of Systems  Constitutional: Negative for activity change and appetite change.  HENT: Negative for sinus pressure and sore throat.   Eyes: Negative for visual disturbance.  Respiratory: Negative for cough, chest tightness and shortness of breath.   Cardiovascular: Negative for chest pain and leg swelling.  Gastrointestinal: Negative for abdominal distention, abdominal pain, constipation and diarrhea.  Endocrine: Negative.   Genitourinary: Negative for dysuria.  Musculoskeletal: Negative for joint swelling and myalgias.  Skin: Negative for rash.  Allergic/Immunologic: Negative.   Neurological: Negative for weakness, light-headedness and numbness.  Psychiatric/Behavioral: Negative for  dysphoric mood and suicidal ideas.    Objective:  BP 126/80   Pulse 98   Ht '5\' 6"'  (1.676 m)   Wt 153 lb (69.4 kg)   SpO2 99%   BMI 24.69 kg/m   BP/Weight 01/12/2020 12/10/5407 [birth year]1913  Systolic BP 782 956 213  Diastolic BP 80 88 80  Wt. (Lbs) 153 153.13 156.2  BMI 24.69 24.53 26.81      Physical  Exam Constitutional:      Appearance: He is well-developed.  Neck:     Vascular: No JVD.  Cardiovascular:     Rate and Rhythm: Normal rate.     Heart sounds: Normal heart sounds. No murmur heard.   Pulmonary:     Effort: Pulmonary effort is normal.     Breath sounds: Normal breath sounds. No wheezing or rales.  Chest:     Chest wall: No tenderness.  Abdominal:     General: Bowel sounds are normal. There is no distension.     Palpations: Abdomen is soft. There is no mass.     Tenderness: There is no abdominal tenderness.  Musculoskeletal:        General: Normal range of motion.     Right lower leg: No edema.     Left lower leg: No edema.  Neurological:     Mental Status: He is alert and oriented to person, place, and time.  Psychiatric:        Mood and Affect: Mood normal.     CMP Latest Ref Rng & Units 12/27/2019 12/24/2019 12/17/2019  Glucose 70 - 99 mg/dL 278(H) 549(HH) 383(H)  BUN 6 - 23 mg/dL '7 10 11  ' Creatinine 0.40 - 1.50 mg/dL 0.44 0.63 0.48  Sodium 135 - 145 mEq/L 128(L) 129(L) 129(L)  Potassium 3.5 - 5.1 mEq/L 3.7 5.1 4.4  Chloride 96 - 112 mEq/L 96 95(L) 99  CO2 19 - 32 mEq/L '23 24 22  ' Calcium 8.4 - 10.5 mg/dL 8.8 9.3 8.9  Total Protein 6.0 - 8.3 g/dL 8.0 8.7(H) 7.6  Total Bilirubin 0.2 - 1.2 mg/dL 3.7(H) 4.9(H) 6.6(H)  Alkaline Phos 39 - 117 U/L 254(H) 285(H) 226(H)  AST 0 - 37 U/L 39(H) 48(H) 41(H)  ALT 0 - 53 U/L '16 21 20    ' Lipid Panel  No results found for: CHOL, TRIG, HDL, CHOLHDL, VLDL, LDLCALC, LDLDIRECT  CBC    Component Value Date/Time   WBC 14.5 (H) 12/27/2019 1106   RBC 3.60 (L) 12/27/2019 1106   HGB 11.0 (L) 12/27/2019 1106   HGB 10.4 (L) 12/08/2019 1023   HCT 32.8 (L) 12/27/2019 1106   HCT 27.7 (L) 12/08/2019 1023   PLT 193.0 12/27/2019 1106   PLT 274 12/08/2019 1023   MCV 91.2 12/27/2019 1106   MCV 85 12/08/2019 1023   MCH 31.7 12/08/2019 1023   MCH 32.0 11/29/2019 0412   MCHC 33.4 12/27/2019 1106   RDW 15.1 12/27/2019 1106   RDW  14.8 12/08/2019 1023   LYMPHSABS 2.5 12/27/2019 1106   LYMPHSABS 1.1 12/08/2019 1023   MONOABS 1.2 (H) 12/27/2019 1106   EOSABS 0.1 12/27/2019 1106   EOSABS 0.0 12/08/2019 1023   BASOSABS 0.4 (H) 12/27/2019 1106   BASOSABS 0.1 12/08/2019 1023    Lab Results  Component Value Date   HGBA1C 4.3 (L) 11/28/2019    Assessment & Plan:  1. Alcoholic liver disease ([place]) Last drink was 2 weeks ago Commenced on Naltrexone He declines AA referral - naltrexone (DEPADE) 50 MG tablet; Take  1 tablet (50 mg total) by mouth daily.  Dispense: 30 tablet; Refill: 3 - pantoprazole (PROTONIX) 40 MG tablet; Take 1 tablet (40 mg total) by mouth daily.  Dispense: 30 tablet; Refill: 3 - CMP14+EGFR - CBC with Differential/Platelet - Protime-INR  2. 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  Dispense: 60 tablet; Refill: 3   Health Care Maintenance: Tdap at next visit No orders of the defined types were placed in this encounter.   Follow-up: No follow-ups on file.      Charlott Rakes, MD, FAAFP. [facility] [place], [place]   01/12/2020, 10:18 AM

## 2020-01-12 NOTE — Patient Instructions (Signed)
Alcoholic Liver Disease  Alcoholic liver disease is liver damage that is caused by drinking a lot of alcohol for a long time. If you have this disease, you must stop drinking alcohol. Follow these instructions at home:   Do not drink alcohol. Follow your treatment plan. Work with your doctor if you need help.  Think about joining an alcohol support group.  Take over-the-counter and prescription medicines only as told by your doctor. These include vitamins.  Do not use medicines or eat foods that have alcohol in them, unless your doctor says that it is safe.  Follow instructions from your doctor about eating a healthy diet.  Keep all follow-up visits as told by your doctor. This is important. Contact a doctor if:  You get a fever.  Your skin starts to look more yellow, pale, or dark.  You get headaches. Get help right away if:  You throw up (vomit) blood.  You have bright red blood in your poop (stool).  Your poop looks black or looks like tar.  You have trouble: ? Thinking. ? Walking. ? Balancing. ? Breathing. Summary  Alcoholic liver disease is liver damage that is caused by drinking a lot of alcohol for a long time.  If you have this disease, you must stop drinking alcohol. Follow your treatment plan, and work with your doctor as needed.  Think about joining an alcohol support group. This information is not intended to replace advice given to you by your health care provider. Make sure you discuss any questions you have with your health care provider. Document Revised: 10/13/2018 Document Reviewed: 03/14/2017 Elsevier Patient Education  2020 Elsevier Inc.  

## 2020-01-13 LAB — CBC WITH DIFFERENTIAL/PLATELET
Basophils Absolute: 0.1 10*3/uL (ref 0.0–0.2)
Basos: 1 %
EOS (ABSOLUTE): 0.1 10*3/uL (ref 0.0–0.4)
Eos: 1 %
Hematocrit: 31.5 % — ABNORMAL LOW (ref 37.5–51.0)
Hemoglobin: 11.1 g/dL — ABNORMAL LOW (ref 13.0–17.7)
Immature Grans (Abs): 0 10*3/uL (ref 0.0–0.1)
Immature Granulocytes: 0 %
Lymphocytes Absolute: 2.5 10*3/uL (ref 0.7–3.1)
Lymphs: 21 %
MCH: 30.2 pg (ref 26.6–33.0)
MCHC: 35.2 g/dL (ref 31.5–35.7)
MCV: 86 fL (ref 79–97)
Monocytes Absolute: 1.4 10*3/uL — ABNORMAL HIGH (ref 0.1–0.9)
Monocytes: 12 %
Neutrophils Absolute: 7.7 10*3/uL — ABNORMAL HIGH (ref 1.4–7.0)
Neutrophils: 65 %
Platelets: 159 10*3/uL (ref 150–450)
RBC: 3.68 x10E6/uL — ABNORMAL LOW (ref 4.14–5.80)
RDW: 13.4 % (ref 11.6–15.4)
WBC: 11.7 10*3/uL — ABNORMAL HIGH (ref 3.4–10.8)

## 2020-01-13 LAB — CMP14+EGFR
ALT: 7 IU/L (ref 0–44)
AST: 45 IU/L — ABNORMAL HIGH (ref 0–40)
Albumin/Globulin Ratio: 0.9 — ABNORMAL LOW (ref 1.2–2.2)
Albumin: 3.9 g/dL — ABNORMAL LOW (ref 4.0–5.0)
Alkaline Phosphatase: 377 IU/L — ABNORMAL HIGH (ref 48–121)
BUN/Creatinine Ratio: 6 — ABNORMAL LOW (ref 9–20)
BUN: 3 mg/dL — ABNORMAL LOW (ref 6–24)
Bilirubin Total: 2.4 mg/dL — ABNORMAL HIGH (ref 0.0–1.2)
CO2: 22 mmol/L (ref 20–29)
Calcium: 8.6 mg/dL — ABNORMAL LOW (ref 8.7–10.2)
Chloride: 99 mmol/L (ref 96–106)
Creatinine, Ser: 0.52 mg/dL — ABNORMAL LOW (ref 0.76–1.27)
GFR calc Af Amer: 147 mL/min/{1.73_m2} (ref >59)
GFR calc non Af Amer: 127 mL/min/{1.73_m2} (ref >59)
Globulin, Total: 4.3 g/dL (ref 1.5–4.5)
Glucose: 84 mg/dL (ref 65–99)
Potassium: 4 mmol/L (ref 3.5–5.2)
Sodium: 135 mmol/L (ref 134–144)
Total Protein: 8.2 g/dL (ref 6.0–8.5)

## 2020-01-13 LAB — PROTIME-INR
INR: 1.3 — ABNORMAL HIGH (ref 0.9–1.2)
Prothrombin Time: 13.5 s — ABNORMAL HIGH (ref 9.1–12.0)

## 2020-01-14 ENCOUNTER — Telehealth: Payer: Self-pay

## 2020-01-14 NOTE — Telephone Encounter (Signed)
Patient was called and a voicemail was left informing patient to return phone call for lab results. 

## 2020-01-14 NOTE — Telephone Encounter (Signed)
-----   Message from Charlott Rakes, MD sent at 01/13/2020  8:48 AM EDT ----- Liver enzymes are still elevated likely from his chronic liver disease.  Please advised to continue to follow-up with GI.  White blood cell count is slightly elevated but this is likely due to the fact that he recently completed a course of prednisone.  Please encouraged to continue to abstain from alcohol.

## 2020-01-17 ENCOUNTER — Telehealth: Payer: Self-pay

## 2020-01-17 NOTE — Telephone Encounter (Signed)
-----   Message from Charlott Rakes, MD sent at 01/13/2020  8:48 AM EDT ----- Liver enzymes are still elevated likely from his chronic liver disease.  Please advised to continue to follow-up with GI.  White blood cell count is slightly elevated but this is likely due to the fact that he recently completed a course of prednisone.  Please encouraged to continue to abstain from alcohol.

## 2020-01-17 NOTE — Telephone Encounter (Signed)
Call placed to patient to go over lab results. Message states that call can not be completed at this time.

## 2020-04-27 ENCOUNTER — Ambulatory Visit: Payer: Self-pay | Admitting: Physician Assistant

## 2020-05-11 ENCOUNTER — Other Ambulatory Visit: Payer: Self-pay

## 2020-05-11 ENCOUNTER — Ambulatory Visit: Payer: Self-pay | Admitting: Physician Assistant

## 2020-05-29 ENCOUNTER — Ambulatory Visit: Payer: Self-pay | Admitting: Family Medicine

## 2020-07-08 ENCOUNTER — Other Ambulatory Visit: Payer: Self-pay

## 2020-07-08 ENCOUNTER — Inpatient Hospital Stay (HOSPITAL_COMMUNITY)
Admission: EM | Admit: 2020-07-08 | Discharge: 2020-07-10 | DRG: 100 | Disposition: A | Discharge status: Home / Self Care | Payer: 59 | Attending: Internal Medicine | Admitting: Internal Medicine

## 2020-07-08 ENCOUNTER — Encounter (HOSPITAL_COMMUNITY): Payer: Self-pay | Admitting: Emergency Medicine

## 2020-07-08 DIAGNOSIS — E871 Hypo-osmolality and hyponatremia: Secondary | ICD-10-CM | POA: Diagnosis present

## 2020-07-08 DIAGNOSIS — R7989 Other specified abnormal findings of blood chemistry: Secondary | ICD-10-CM

## 2020-07-08 DIAGNOSIS — R9431 Abnormal electrocardiogram [ECG] [EKG]: Secondary | ICD-10-CM | POA: Diagnosis present

## 2020-07-08 DIAGNOSIS — K7030 Alcoholic cirrhosis of liver without ascites: Secondary | ICD-10-CM | POA: Diagnosis present

## 2020-07-08 DIAGNOSIS — U071 COVID-19: Secondary | ICD-10-CM

## 2020-07-08 DIAGNOSIS — K922 Gastrointestinal hemorrhage, unspecified: Secondary | ICD-10-CM

## 2020-07-08 DIAGNOSIS — G40409 Other generalized epilepsy and epileptic syndromes, not intractable, without status epilepticus: Principal | ICD-10-CM | POA: Diagnosis present

## 2020-07-08 DIAGNOSIS — D696 Thrombocytopenia, unspecified: Secondary | ICD-10-CM

## 2020-07-08 DIAGNOSIS — R569 Unspecified convulsions: Secondary | ICD-10-CM

## 2020-07-08 DIAGNOSIS — K219 Gastro-esophageal reflux disease without esophagitis: Secondary | ICD-10-CM

## 2020-07-08 DIAGNOSIS — R17 Unspecified jaundice: Secondary | ICD-10-CM

## 2020-07-08 DIAGNOSIS — T426X6A Underdosing of other antiepileptic and sedative-hypnotic drugs, initial encounter: Secondary | ICD-10-CM | POA: Diagnosis present

## 2020-07-08 DIAGNOSIS — D689 Coagulation defect, unspecified: Secondary | ICD-10-CM | POA: Diagnosis present

## 2020-07-08 DIAGNOSIS — K7010 Alcoholic hepatitis without ascites: Secondary | ICD-10-CM | POA: Diagnosis present

## 2020-07-08 DIAGNOSIS — D649 Anemia, unspecified: Secondary | ICD-10-CM

## 2020-07-08 DIAGNOSIS — Z833 Family history of diabetes mellitus: Secondary | ICD-10-CM

## 2020-07-08 DIAGNOSIS — D500 Iron deficiency anemia secondary to blood loss (chronic): Secondary | ICD-10-CM | POA: Diagnosis present

## 2020-07-08 DIAGNOSIS — E872 Acidosis: Secondary | ICD-10-CM | POA: Diagnosis present

## 2020-07-08 DIAGNOSIS — Z781 Physical restraint status: Secondary | ICD-10-CM

## 2020-07-08 DIAGNOSIS — F17210 Nicotine dependence, cigarettes, uncomplicated: Secondary | ICD-10-CM | POA: Diagnosis present

## 2020-07-08 DIAGNOSIS — F1020 Alcohol dependence, uncomplicated: Secondary | ICD-10-CM | POA: Diagnosis present

## 2020-07-08 DIAGNOSIS — F10239 Alcohol dependence with withdrawal, unspecified: Secondary | ICD-10-CM | POA: Diagnosis present

## 2020-07-08 DIAGNOSIS — F1010 Alcohol abuse, uncomplicated: Secondary | ICD-10-CM | POA: Diagnosis present

## 2020-07-08 DIAGNOSIS — Z91128 Patient's intentional underdosing of medication regimen for other reason: Secondary | ICD-10-CM

## 2020-07-08 DIAGNOSIS — K709 Alcoholic liver disease, unspecified: Secondary | ICD-10-CM

## 2020-07-08 DIAGNOSIS — K766 Portal hypertension: Secondary | ICD-10-CM | POA: Diagnosis present

## 2020-07-08 LAB — CBG MONITORING, ED: Glucose-Capillary: 158 mg/dL — ABNORMAL HIGH (ref 70–99)

## 2020-07-08 MED ORDER — LEVETIRACETAM IN NACL 1000 MG/100ML IV SOLN
1000.0000 mg | Freq: Once | INTRAVENOUS | Status: AC
  Administered 2020-07-09: 1000 mg via INTRAVENOUS
  Filled 2020-07-08: qty 100

## 2020-07-08 MED ORDER — SODIUM CHLORIDE 0.9 % IV SOLN: INTRAVENOUS | Status: DC

## 2020-07-08 MED ORDER — SODIUM CHLORIDE 0.9 % IV BOLUS
1000.0000 mL | Freq: Once | INTRAVENOUS | Status: AC
  Administered 2020-07-09: 1000 mL via INTRAVENOUS

## 2020-07-08 NOTE — ED Provider Notes (Addendum)
TIME SEEN: 11:28 PM  CHIEF COMPLAINT: Seizure  HPI: Patient is a 50 year old male with history of seizures who presents to the emergency department after he had 2 generalized tonic-clonic seizures at home.  Spoke with patient's mother, Birch Farino, by phone.  He had two seizures at home that were grand mal in nature.  He refused transport after the first seizure.  Each lasted 5 minutes or less.  Has had grand mal seizures in the past.  Patient states that he thinks he may have missed some doses of his seizure medications.  It appears he is on Keppra and Dilantin.  He did bite his tongue during 1 of these episodes.  Denies any other pain.  No headache, neck or back pain.  No numbness or weakness.  Denies drug or alcohol use.  No recent fevers, cough, nausea, vomiting or diarrhea.  ROS: See HPI Constitutional: no fever  Eyes: no drainage  ENT: no runny nose   Cardiovascular:  no chest pain  Resp: no SOB  GI: no vomiting GU: no dysuria Integumentary: no rash  Allergy: no hives  Musculoskeletal: no leg swelling  Neurological: no slurred speech ROS otherwise negative  PAST MEDICAL HISTORY/PAST SURGICAL HISTORY:  Past Medical History:  Diagnosis Date  . 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 Seizures (HCC)     MEDICATIONS:  Prior to Admission medications   Medication Sig Start Date End Date Taking? Authorizing Provider  levETIRAcetam (KEPPRA) 500 MG tablet Take 1 tablet (500 mg total) by mouth 2 (two) times daily. 01/12/20   Hoy Register, MD  naltrexone (DEPADE) 50 MG tablet Take 1 tablet (50 mg total) by mouth daily. 01/12/20   Hoy Register, MD  pantoprazole (PROTONIX) 40 MG tablet Take 1 tablet (40 mg total) by mouth daily. 01/12/20   Hoy Register, MD  phenytoin (DILANTIN) 100 MG ER capsule Take 3 capsules (300 mg total) by mouth daily. 11/29/19   Lonia Blood, MD  vitamin k 100 MCG tablet Take 100 mcg by mouth daily. For 60 days per patient     [provider]    ALLERGIES:  Allergies  Allergen Reactions  . Iohexol Other (See Comments)    Unknown reaction at 9 days old    SOCIAL HISTORY:  Social History   Tobacco Use  . Smoking status: Current Every Day Smoker    Packs/day: 2.00    Years: 25.00    Pack years: 50.00  . Smokeless tobacco: Never Used  Substance Use Topics  . Alcohol use: Not Currently    Alcohol/week: 3.0 standard drinks    Types: 3 Cans of beer per week    FAMILY HISTORY: Family History  Problem Relation Age of Onset  . Diabetes Mellitus II Mother   . Colon cancer Neg Hx   . Esophageal cancer Neg Hx     EXAM: BP 133/86   Pulse (!) 112   Temp 99.5 F (37.5 C) (Oral)   Resp (!) 22   Ht 5\' 6"  (1.676 m)   Wt 70 kg   SpO2 99%   BMI 24.91 kg/m  CONSTITUTIONAL: Alert and oriented x3 and responds appropriately to questions. Well-appearing; well-nourished HEAD: Normocephalic, appears atraumatic EYES: Conjunctivae clear, pupils appear equal, EOM appear intact; + scleral icterus ENT: normal nose; moist mucous membranes, no dental injury, abrasion to the end of the left side of the tongue NECK: Supple, normal ROM, no midline spinal tenderness or step-off or  deformity CARD: Regular tachycardic; S1 and S2 appreciated; no murmurs, no clicks, no rubs, no gallops RESP: Normal chest excursion without splinting or tachypnea; breath sounds clear and equal bilaterally; no wheezes, no rhonchi, no rales, no hypoxia or respiratory distress, speaking full sentences ABD/GI: Normal bowel sounds; non-distended; soft, non-tender, no rebound, no guarding, no peritoneal signs, no hepatosplenomegaly BACK:  The back appears normal no midline spinal tenderness or step-off or deformity EXT: Normal ROM in all joints; no deformity noted, no edema; no cyanosis SKIN: Normal color for age and race; warm; no rash on exposed skin NEURO: Moves all extremities equally, normal sensation diffusely, cranial nerves II through  XII intact, normal speech PSYCH: The patient's mood and manner are appropriate.   MEDICAL DECISION MAKING: Patient here after 2 seizures.  Reports missing some doses of medication.  Will give IV Keppra load and check Dilantin level.  Labs, urine pending.  No longer postictal.  We will continue to monitor.  Does have an abrasion to the tongue but no laceration that needs repair.  No other sign of traumatic injury on exam.  Blood glucose normal today.  ED PROGRESS: Patient's hemoglobin has come back at 6.7 which is a significant drop from 11 in July 2021.  Will recheck.  Will obtain Hemoccult and type and screen.   Patient is Hemoccult positive.  No gross blood or melena per nursing staff.  He denies having gross blood or melena at home but states his stools have been dark.  He is not on blood thinners.  No hematemesis.  No history of previous endoscopy or colonoscopy.    Has never had to have a blood transfusion before.  States he does drink alcohol but not regularly.  Patient does have scleral icterus on exam.  His total bilirubin is elevated.  He has no complaints of abdominal pain or vomiting.  He was admitted to the hospital for the same in May 2021.  Appears he saw [facility] at that time.  At that time he was diagnosed with acute alcohol-related pancreatitis, alcoholic hepatitis, cirrhosis and had an abnormal lesion on the body of the pancreas.  They recommended biopsy.  Unclear if patient ever followed up.  We will obtain two hour Covid test in case patient needs endoscopy or colonoscopy in the morning due to GI bleed.  Patient states he does not drink alcohol every day however his chart states that he has a history of alcohol abuse.  His alcohol level today is negative.  There could have been a component of withdrawal seizures.  Will place on CIWA protocol.  Drug screen negative.  He has received IV Keppra.  Dilantin level undetectable.  We will also give IV Dilantin load.  2:16 AM Discussed  patient's case with hospitalist, Dr. Marlowe Sax.  I have recommended admission and patient (and family if present) agree with this plan. Admitting physician will place admission orders.   I reviewed all nursing notes, vitals, pertinent previous records and reviewed/interpreted all EKGs, lab and urine results, imaging (as available).  Patient has also been seen by [facility].  Will send secure chat message for follow-up in the morning.   5:00 AM  Pt's Covid test is positive.  He is not having any respiratory symptoms at this time.  No hypoxia or respiratory distress.  Will message to the hospitalist for update.   EKG Interpretation  Date/Time:  Sunday July 09 2020 00:03:53 EST Ventricular Rate:  104 PR Interval:    QRS Duration:  87 QT Interval:  371 QTC Calculation: 488 R Axis:   57 Text Interpretation: Sinus tachycardia Borderline prolonged QT interval No significant change since last tracing Confirmed by Pryor Curia [phone]) on 07/09/2020 12:11:40 AM        CRITICAL CARE Performed by: Cyril Mourning Ward   Total critical care time: 50 minutes  Critical care time was exclusive of separately billable procedures and treating other patients.  Critical care was necessary to treat or prevent imminent or life-threatening deterioration.  Critical care was time spent personally by me on the following activities: development of treatment plan with patient and/or surrogate as well as nursing, discussions with consultants, evaluation of patient's response to treatment, examination of patient, obtaining history from patient or surrogate, ordering and performing treatments and interventions, ordering and review of laboratory studies, ordering and review of radiographic studies, pulse oximetry and re-evaluation of patient's condition.   KEMUEL ARN was evaluated in Emergency Department on 07/08/2020 for the symptoms described in the history of present illness. He was evaluated in the context of the  global COVID-19 pandemic, which necessitated consideration that the patient might be at risk for infection with the SARS-CoV-2 virus that causes COVID-19. Institutional protocols and algorithms that pertain to the evaluation of patients at risk for COVID-19 are in a state of rapid change based on information released by regulatory bodies including the CDC and federal and state organizations. These policies and algorithms were followed during the patient's care in the ED.      Ward, Delice Bison, DO 07/09/20 0217    Ward, Delice Bison, DO 07/09/20 [id number]

## 2020-07-08 NOTE — ED Triage Notes (Signed)
Pt arrived via EMS from home. Pt's mom called EMS stating that he had a seizure and this was his second seizure today. Pt normally has absence seizures, but today he has had grand mal seizures. Pt has hx of seizures and takes medication for it, but does not know what the medication is. Pt is a little confused post-ictal, but is less confused than when EMS arrived.

## 2020-07-09 ENCOUNTER — Inpatient Hospital Stay (HOSPITAL_COMMUNITY): Payer: Self-pay

## 2020-07-09 DIAGNOSIS — K766 Portal hypertension: Secondary | ICD-10-CM | POA: Diagnosis present

## 2020-07-09 DIAGNOSIS — R9431 Abnormal electrocardiogram [ECG] [EKG]: Secondary | ICD-10-CM | POA: Diagnosis present

## 2020-07-09 DIAGNOSIS — K7030 Alcoholic cirrhosis of liver without ascites: Secondary | ICD-10-CM | POA: Diagnosis present

## 2020-07-09 DIAGNOSIS — F17210 Nicotine dependence, cigarettes, uncomplicated: Secondary | ICD-10-CM | POA: Diagnosis present

## 2020-07-09 DIAGNOSIS — K219 Gastro-esophageal reflux disease without esophagitis: Secondary | ICD-10-CM | POA: Diagnosis present

## 2020-07-09 DIAGNOSIS — D696 Thrombocytopenia, unspecified: Secondary | ICD-10-CM | POA: Diagnosis present

## 2020-07-09 DIAGNOSIS — D500 Iron deficiency anemia secondary to blood loss (chronic): Secondary | ICD-10-CM | POA: Diagnosis present

## 2020-07-09 DIAGNOSIS — U071 COVID-19: Secondary | ICD-10-CM

## 2020-07-09 DIAGNOSIS — R569 Unspecified convulsions: Secondary | ICD-10-CM | POA: Diagnosis not present

## 2020-07-09 DIAGNOSIS — E872 Acidosis: Secondary | ICD-10-CM | POA: Diagnosis present

## 2020-07-09 DIAGNOSIS — D509 Iron deficiency anemia, unspecified: Secondary | ICD-10-CM | POA: Diagnosis not present

## 2020-07-09 DIAGNOSIS — Z91128 Patient's intentional underdosing of medication regimen for other reason: Secondary | ICD-10-CM | POA: Diagnosis not present

## 2020-07-09 DIAGNOSIS — D649 Anemia, unspecified: Secondary | ICD-10-CM

## 2020-07-09 DIAGNOSIS — Z833 Family history of diabetes mellitus: Secondary | ICD-10-CM | POA: Diagnosis not present

## 2020-07-09 DIAGNOSIS — G40409 Other generalized epilepsy and epileptic syndromes, not intractable, without status epilepticus: Secondary | ICD-10-CM | POA: Diagnosis present

## 2020-07-09 DIAGNOSIS — F1020 Alcohol dependence, uncomplicated: Secondary | ICD-10-CM | POA: Diagnosis present

## 2020-07-09 DIAGNOSIS — D689 Coagulation defect, unspecified: Secondary | ICD-10-CM | POA: Diagnosis present

## 2020-07-09 DIAGNOSIS — K922 Gastrointestinal hemorrhage, unspecified: Secondary | ICD-10-CM | POA: Diagnosis present

## 2020-07-09 DIAGNOSIS — F10239 Alcohol dependence with withdrawal, unspecified: Secondary | ICD-10-CM | POA: Diagnosis present

## 2020-07-09 DIAGNOSIS — R17 Unspecified jaundice: Secondary | ICD-10-CM

## 2020-07-09 DIAGNOSIS — K7010 Alcoholic hepatitis without ascites: Secondary | ICD-10-CM | POA: Diagnosis present

## 2020-07-09 DIAGNOSIS — T426X6A Underdosing of other antiepileptic and sedative-hypnotic drugs, initial encounter: Secondary | ICD-10-CM | POA: Diagnosis present

## 2020-07-09 DIAGNOSIS — E871 Hypo-osmolality and hyponatremia: Secondary | ICD-10-CM | POA: Diagnosis present

## 2020-07-09 DIAGNOSIS — F1010 Alcohol abuse, uncomplicated: Secondary | ICD-10-CM | POA: Diagnosis not present

## 2020-07-09 DIAGNOSIS — Z781 Physical restraint status: Secondary | ICD-10-CM | POA: Diagnosis not present

## 2020-07-09 LAB — HEMOGLOBIN AND HEMATOCRIT, BLOOD
HCT: 20.8 % — ABNORMAL LOW (ref 39.0–52.0)
Hemoglobin: 6.9 g/dL — CL (ref 13.0–17.0)

## 2020-07-09 LAB — URINALYSIS, COMPLETE (UACMP) WITH MICROSCOPIC
Glucose, UA: NEGATIVE mg/dL
Ketones, ur: 5 mg/dL — AB
Leukocytes,Ua: NEGATIVE
Nitrite: NEGATIVE
Protein, ur: 30 mg/dL — AB
Specific Gravity, Urine: 1.006 (ref 1.005–1.030)
pH: 7 (ref 5.0–8.0)

## 2020-07-09 LAB — CBC
HCT: 28.7 % — ABNORMAL LOW (ref 39.0–52.0)
Hemoglobin: 9.5 g/dL — ABNORMAL LOW (ref 13.0–17.0)
MCH: 25.4 pg — ABNORMAL LOW (ref 26.0–34.0)
MCHC: 33.1 g/dL (ref 30.0–36.0)
MCV: 76.7 fL — ABNORMAL LOW (ref 80.0–100.0)
Platelets: 89 10*3/uL — ABNORMAL LOW (ref 150–400)
RBC: 3.74 MIL/uL — ABNORMAL LOW (ref 4.22–5.81)
RDW: 21.7 % — ABNORMAL HIGH (ref 11.5–15.5)
WBC: 8.1 10*3/uL (ref 4.0–10.5)
nRBC: 0 % (ref 0.0–0.2)

## 2020-07-09 LAB — COMPREHENSIVE METABOLIC PANEL
ALT: 16 U/L (ref 0–44)
ALT: 17 U/L (ref 0–44)
AST: 89 U/L — ABNORMAL HIGH (ref 15–41)
AST: 97 U/L — ABNORMAL HIGH (ref 15–41)
Albumin: 2.7 g/dL — ABNORMAL LOW (ref 3.5–5.0)
Albumin: 2.8 g/dL — ABNORMAL LOW (ref 3.5–5.0)
Alkaline Phosphatase: 204 U/L — ABNORMAL HIGH (ref 38–126)
Alkaline Phosphatase: 213 U/L — ABNORMAL HIGH (ref 38–126)
Anion gap: 11 (ref 5–15)
Anion gap: 14 (ref 5–15)
BUN: <5 mg/dL — ABNORMAL LOW (ref 6–20)
BUN: <5 mg/dL — ABNORMAL LOW (ref 6–20)
CO2: 17 mmol/L — ABNORMAL LOW (ref 22–32)
CO2: 20 mmol/L — ABNORMAL LOW (ref 22–32)
Calcium: 7.5 mg/dL — ABNORMAL LOW (ref 8.9–10.3)
Calcium: 7.9 mg/dL — ABNORMAL LOW (ref 8.9–10.3)
Chloride: 104 mmol/L (ref 98–111)
Chloride: 99 mmol/L (ref 98–111)
Creatinine, Ser: 0.37 mg/dL — ABNORMAL LOW (ref 0.61–1.24)
Creatinine, Ser: 0.37 mg/dL — ABNORMAL LOW (ref 0.61–1.24)
GFR, Estimated: >60 mL/min (ref >60)
GFR, Estimated: >60 mL/min (ref >60)
Glucose, Bld: 100 mg/dL — ABNORMAL HIGH (ref 70–99)
Glucose, Bld: 146 mg/dL — ABNORMAL HIGH (ref 70–99)
Potassium: 3.6 mmol/L (ref 3.5–5.1)
Potassium: 3.6 mmol/L (ref 3.5–5.1)
Sodium: 130 mmol/L — ABNORMAL LOW (ref 135–145)
Sodium: 135 mmol/L (ref 135–145)
Total Bilirubin: 12.5 mg/dL — ABNORMAL HIGH (ref 0.3–1.2)
Total Bilirubin: 9.6 mg/dL — ABNORMAL HIGH (ref 0.3–1.2)
Total Protein: 7.4 g/dL (ref 6.5–8.1)
Total Protein: 7.7 g/dL (ref 6.5–8.1)

## 2020-07-09 LAB — CBC WITH DIFFERENTIAL/PLATELET
Abs Immature Granulocytes: 0.05 10*3/uL (ref 0.00–0.07)
Basophils Absolute: 0 10*3/uL (ref 0.0–0.1)
Basophils Relative: 0 %
Eosinophils Absolute: 0 10*3/uL (ref 0.0–0.5)
Eosinophils Relative: 0 %
HCT: 20.4 % — ABNORMAL LOW (ref 39.0–52.0)
Hemoglobin: 6.7 g/dL — CL (ref 13.0–17.0)
Immature Granulocytes: 1 %
Lymphocytes Relative: 12 %
Lymphs Abs: 0.8 10*3/uL (ref 0.7–4.0)
MCH: 23.9 pg — ABNORMAL LOW (ref 26.0–34.0)
MCHC: 32.8 g/dL (ref 30.0–36.0)
MCV: 72.9 fL — ABNORMAL LOW (ref 80.0–100.0)
Monocytes Absolute: 0.6 10*3/uL (ref 0.1–1.0)
Monocytes Relative: 9 %
Neutro Abs: 5.4 10*3/uL (ref 1.7–7.7)
Neutrophils Relative %: 78 %
Platelets: 75 10*3/uL — ABNORMAL LOW (ref 150–400)
RBC: 2.8 MIL/uL — ABNORMAL LOW (ref 4.22–5.81)
RDW: 21.4 % — ABNORMAL HIGH (ref 11.5–15.5)
WBC: 7 10*3/uL (ref 4.0–10.5)
nRBC: 0 % (ref 0.0–0.2)

## 2020-07-09 LAB — VITAMIN B12: Vitamin B-12: 787 pg/mL (ref 180–914)

## 2020-07-09 LAB — PROTIME-INR
INR: 1.7 — ABNORMAL HIGH (ref 0.8–1.2)
Prothrombin Time: 19.3 seconds — ABNORMAL HIGH (ref 11.4–15.2)

## 2020-07-09 LAB — HEPATITIS C ANTIBODY: HCV Ab: NONREACTIVE

## 2020-07-09 LAB — TRIGLYCERIDES: Triglycerides: 120 mg/dL (ref <150)

## 2020-07-09 LAB — RAPID URINE DRUG SCREEN, HOSP PERFORMED
Amphetamines: NOT DETECTED
Barbiturates: NOT DETECTED
Benzodiazepines: NOT DETECTED
Cocaine: NOT DETECTED
Opiates: NOT DETECTED
Tetrahydrocannabinol: NOT DETECTED

## 2020-07-09 LAB — LACTATE DEHYDROGENASE: LDH: 187 U/L (ref 98–192)

## 2020-07-09 LAB — PHENYTOIN LEVEL, TOTAL: Phenytoin Lvl: <2.5 ug/mL — ABNORMAL LOW (ref 10.0–20.0)

## 2020-07-09 LAB — SAVE SMEAR(SSMR), FOR PROVIDER SLIDE REVIEW

## 2020-07-09 LAB — RESP PANEL BY RT-PCR (FLU A&B, COVID) ARPGX2
Influenza A by PCR: NEGATIVE
Influenza B by PCR: NEGATIVE
SARS Coronavirus 2 by RT PCR: POSITIVE — AB

## 2020-07-09 LAB — C-REACTIVE PROTEIN: CRP: 1.4 mg/dL — ABNORMAL HIGH (ref <1.0)

## 2020-07-09 LAB — ABO/RH: ABO/RH(D): O POS

## 2020-07-09 LAB — FERRITIN: Ferritin: 25 ng/mL (ref 24–336)

## 2020-07-09 LAB — POC OCCULT BLOOD, ED: Fecal Occult Bld: POSITIVE — AB

## 2020-07-09 LAB — PHOSPHORUS: Phosphorus: 1.8 mg/dL — ABNORMAL LOW (ref 2.5–4.6)

## 2020-07-09 LAB — FOLATE: Folate: 6.5 ng/mL (ref >5.9)

## 2020-07-09 LAB — ETHANOL: Alcohol, Ethyl (B): <10 mg/dL (ref <10)

## 2020-07-09 LAB — HIV ANTIBODY (ROUTINE TESTING W REFLEX): HIV Screen 4th Generation wRfx: NONREACTIVE

## 2020-07-09 LAB — D-DIMER, QUANTITATIVE: D-Dimer, Quant: 0.51 ug/mL-FEU — ABNORMAL HIGH (ref 0.00–0.50)

## 2020-07-09 LAB — LIPASE, BLOOD: Lipase: 12 U/L (ref 11–51)

## 2020-07-09 LAB — FIBRINOGEN: Fibrinogen: 238 mg/dL (ref 210–475)

## 2020-07-09 LAB — PREPARE RBC (CROSSMATCH)

## 2020-07-09 LAB — MAGNESIUM
Magnesium: 1.2 mg/dL — ABNORMAL LOW (ref 1.7–2.4)
Magnesium: 1.8 mg/dL (ref 1.7–2.4)

## 2020-07-09 MED ORDER — SODIUM CHLORIDE 0.9 % IV SOLN
400.0000 mg | Freq: Every day | INTRAVENOUS | Status: DC
  Administered 2020-07-09: 400 mg via INTRAVENOUS
  Filled 2020-07-09: qty 8

## 2020-07-09 MED ORDER — MAGNESIUM SULFATE 2 GM/50ML IV SOLN
2.0000 g | Freq: Once | INTRAVENOUS | Status: AC
  Administered 2020-07-09: 2 g via INTRAVENOUS
  Filled 2020-07-09: qty 50

## 2020-07-09 MED ORDER — THIAMINE HCL 100 MG/ML IJ SOLN
100.0000 mg | Freq: Every day | INTRAMUSCULAR | Status: DC
  Administered 2020-07-10: 100 mg via INTRAVENOUS
  Filled 2020-07-09: qty 2

## 2020-07-09 MED ORDER — PANTOPRAZOLE SODIUM 40 MG IV SOLR
40.0000 mg | INTRAVENOUS | Status: DC
  Administered 2020-07-09 – 2020-07-10 (×2): 40 mg via INTRAVENOUS
  Filled 2020-07-09 (×2): qty 40

## 2020-07-09 MED ORDER — SODIUM CHLORIDE 0.9 % IV SOLN
800.0000 mg | Freq: Once | INTRAVENOUS | Status: AC
  Administered 2020-07-09: 800 mg via INTRAVENOUS
  Filled 2020-07-09: qty 16

## 2020-07-09 MED ORDER — K PHOS MONO-SOD PHOS DI & MONO 155-852-130 MG PO TABS
500.0000 mg | ORAL_TABLET | ORAL | Status: AC
  Administered 2020-07-09 (×2): 500 mg via ORAL
  Filled 2020-07-09 (×2): qty 2

## 2020-07-09 MED ORDER — LORAZEPAM 2 MG/ML IJ SOLN: 0.0000 mg | Freq: Two times a day (BID) | INTRAMUSCULAR | Status: DC

## 2020-07-09 MED ORDER — THIAMINE HCL 100 MG PO TABS
100.0000 mg | ORAL_TABLET | Freq: Every day | ORAL | Status: DC
  Administered 2020-07-09: 100 mg via ORAL
  Filled 2020-07-09: qty 1

## 2020-07-09 MED ORDER — LORAZEPAM 1 MG PO TABS: 0.0000 mg | ORAL_TABLET | Freq: Two times a day (BID) | ORAL | Status: DC

## 2020-07-09 MED ORDER — LEVETIRACETAM IN NACL 500 MG/100ML IV SOLN
500.0000 mg | Freq: Two times a day (BID) | INTRAVENOUS | Status: DC
  Administered 2020-07-09 – 2020-07-10 (×3): 500 mg via INTRAVENOUS
  Filled 2020-07-09 (×3): qty 100

## 2020-07-09 MED ORDER — LORAZEPAM 1 MG PO TABS
0.0000 mg | ORAL_TABLET | Freq: Four times a day (QID) | ORAL | Status: DC
  Administered 2020-07-09 (×2): 1 mg via ORAL
  Filled 2020-07-09 (×2): qty 1

## 2020-07-09 MED ORDER — LORAZEPAM 2 MG/ML IJ SOLN
0.0000 mg | Freq: Four times a day (QID) | INTRAMUSCULAR | Status: DC
  Administered 2020-07-10 (×2): 2 mg via INTRAVENOUS
  Filled 2020-07-09 (×2): qty 1

## 2020-07-09 MED ORDER — NICOTINE 21 MG/24HR TD PT24
21.0000 mg | MEDICATED_PATCH | Freq: Every day | TRANSDERMAL | Status: DC
  Filled 2020-07-09: qty 1

## 2020-07-09 MED ORDER — SODIUM CHLORIDE 0.9 % IV SOLN
10.0000 mL/h | Freq: Once | INTRAVENOUS | Status: AC
  Administered 2020-07-09: 10 mL/h via INTRAVENOUS

## 2020-07-09 NOTE — Consult Note (Addendum)
Referring Provider:  [facility]         Primary Care Physician:  Charlott Rakes, MD Primary Gastroenterologist:   Oretha Caprice, MD               We were asked to see this patient for:   Anemia / cirrhosis               ASSESSMENT / PLAN:   # 50 yo male with heme positive stools and microcytic anemia, probably iron deficient with ferritin of 25. He has Etoh cirrhosis. Could have occult bleeding from portal gastropathy. Also consider PUD, AMVs, colon neoplasm.  --No overt GI bleeding. Patient will need EGD and colonoscopy at some point.  --Hgb improved from 6.7 to 9.5 post 2 units PRCs today. --INR 1.7 --No evidence for ascites so no need for SBP prophylaxis  # COVID positive. No PNA on CXR  # Hx of cirrhosis with portal HTN. MELD Na 24. Cirrhosis presumably secondary to Etoh as prior serologic workup for competing causes was negative. Unfortunately he continues to drink ( up until a week ago).  Suspect decompensation of liver disease . Could have superimposed Etoh hepatitis with bilirubin of 12.5. No ascites on [place] today. No evidence for biliary obstruction but there is increased gallbladder wall thickening / edema compared to May. Radiologist favors finding to be related to cirrhosis rather than acalculous cholecystitis.  --Eventual EGD for esophageal varices screening and evaluation of anemia.  --No focal liver lesions on MRI in June 2021 or RUQ [place] today. Will order AFP --No steroids for Etoh hepatitis given GI blood ( even though occult) --Consider CT scan in a few days to evaluate for esophageal varices and also follow up on pancreatic abnormality on previous MRI .  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  MRI in May 2021 also suggested pancreatic lesion. Lesion less obvious on follow up MRCP late June ( ordered by Dr. Ardis Hughs) but he had developed a pseudocyst. Patient hasn't returned for office follow up.   # Hx of Etoh related seizures.  Admitted with seizure activity yesterday but apparently hasn't been taking seizure meds at home. Also, according to H+P, he stopped drinking Etoh a week ago.  Had EEG today. Getting IV seizure medications.     HPI:                                                                                                                             Chief Complaint: anemia  Gary Mitchell is a 50 y.o. male with PMH significant for Etoh cirrhosis with portal HTN, Etoh abuse, alcohol-related seizures,  pancreatitis.   Patient admitted May 2021 with severe etoh hepatitis, pancreatitis at which time imaging showed a pancreatic mass, marked atrophy of pancreatic tail and dilation of main PD.  He was discharged home with 30-day course of steroids He was last seen by [place] in the office late  June 2021 at which time he was still drinking periodically.  Plan was for EUS with FNA but patient was not yet medically stable medically stable.  We repeated his MRI 01/01/2020 .  The masslike lesion in the pancreas was not as clearly appreciated given fluid signal in the area and increased dilatation of the pancreatic duct distally. There was a new fluid collection anterior to the pancreatic body and adjacent to the lesser curvature of the stomach measuring 3.7 cm, felt to be a pseudocyst.  No focal liver lesions.. Dr. Ardis Hughs felt much less concerned about a possible mass as suggested by May 2021 MRI. Patient was advised to avoid Etoh and follow up in office in 6-8 weeks.( didn't return)     Patient brought to ED yesterday by EMS.  He reported seizure activity.  In the ED he seemed to be postictal.  Apparently ED spoke with patient's mother who described to grand mal seizures at home yesterday.  Sounds like he has missed some doses of his seizure medication.  Patient's hgb was 6.7 in ED. It was ~ 11 in July. MCV 76.7. Ferritin 25,  He is FOBT+. Na 130, Alk phos 204, AST 97, ALT 16, bilirubin 9.6, albumin 2.7,  lipase 12.   Phosphorus  is low. He is COVID positive.   Today patient's bilirubin is 12.5, alk phos 213   PREVIOUS ENDOSCOPIC EVALUATIONS / PERTINENT STUDIES   none  Past Medical History:  Diagnosis Date  . 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 Seizures ([place])     Past Surgical History:  Procedure Laterality Date  . left kidney removed      Prior to Admission medications   Medication Sig Start Date End Date Taking? Authorizing Provider  levETIRAcetam (KEPPRA) 500 MG tablet Take 1 tablet (500 mg total) by mouth 2 (two) times daily. 01/12/20  Yes Charlott Rakes, MD  phenytoin (DILANTIN) 100 MG ER capsule Take 3 capsules (300 mg total) by mouth daily. 11/29/19  Yes Cherene Altes, MD  naltrexone (DEPADE) 50 MG tablet Take 1 tablet (50 mg total) by mouth daily. 01/12/20   Charlott Rakes, MD  pantoprazole (PROTONIX) 40 MG tablet Take 1 tablet (40 mg total) by mouth daily. Patient not taking: Reported on 07/08/2020 01/12/20   Charlott Rakes, MD  vitamin k 100 MCG tablet Take 100 mcg by mouth daily. For 60 days per patient Patient not taking: Reported on 07/08/2020    [provider]    Current Facility-Administered Medications  Medication Dose Route Frequency Provider Last Rate Last Admin  . 0.9 %  sodium chloride infusion   Intravenous Continuous Shela Leff, MD 125 mL/hr at 07/09/20 0001 New Bag at 07/09/20 0001  . levETIRAcetam (KEPPRA) IVPB 500 mg/100 mL premix  500 mg Intravenous Q12H Shela Leff, MD 400 mL/hr at 07/09/20 0939 500 mg at 07/09/20 0939  . LORazepam (ATIVAN) injection 0-4 mg  0-4 mg Intravenous Q6H Shela Leff, MD       Or  . LORazepam (ATIVAN) tablet 0-4 mg  0-4 mg Oral Q6H Shela Leff, MD   1 mg at 07/09/20 0900  . [START ON 07/11/2020] LORazepam (ATIVAN) injection 0-4 mg  0-4 mg Intravenous Q12H Shela Leff, MD       Or  . Derrill Memo ON 07/11/2020] LORazepam (ATIVAN) tablet 0-4 mg  0-4 mg Oral Q12H Shela Leff, MD      .  nicotine (NICODERM CQ - dosed in mg/24  hours) patch 21 mg  21 mg Transdermal Daily Shela Leff, MD      . pantoprazole (PROTONIX) injection 40 mg  40 mg Intravenous Q24H Shela Leff, MD   40 mg at 07/09/20 0553  . phenytoin (DILANTIN) 400 mg in sodium chloride 0.9 % 100 mL IVPB  400 mg Intravenous QHS Shela Leff, MD      . thiamine tablet 100 mg  100 mg Oral Daily Shela Leff, MD   100 mg at 07/09/20 8889   Or  . thiamine (B-1) injection 100 mg  100 mg Intravenous Daily Shela Leff, MD       Current Outpatient Medications  Medication Sig Dispense Refill  . levETIRAcetam (KEPPRA) 500 MG tablet Take 1 tablet (500 mg total) by mouth 2 (two) times daily. 60 tablet 3  . phenytoin (DILANTIN) 100 MG ER capsule Take 3 capsules (300 mg total) by mouth daily. 45 capsule 1  . naltrexone (DEPADE) 50 MG tablet Take 1 tablet (50 mg total) by mouth daily. 30 tablet 3  . pantoprazole (PROTONIX) 40 MG tablet Take 1 tablet (40 mg total) by mouth daily. (Patient not taking: Reported on 07/08/2020) 30 tablet 3  . vitamin k 100 MCG tablet Take 100 mcg by mouth daily. For 60 days per patient (Patient not taking: Reported on 07/08/2020)      Allergies as of 07/08/2020 - Review Complete 07/08/2020  Allergen Reaction Noted  . Iohexol Other (See Comments) 05/21/2016    Family History  Problem Relation Age of Onset  . Diabetes Mellitus II Mother   . Colon cancer Neg Hx   . Esophageal cancer Neg Hx     Social History   Socioeconomic History  . Marital status: Single    Spouse name: Not on file  . Number of children: Not on file  . Years of education: Not on file  . Highest education level: Not on file  Occupational History  . Not on file  Tobacco Use  . Smoking status: Current Every Day Smoker    Packs/day: 2.00    Years: 25.00    Pack years: 50.00  . Smokeless tobacco: Never Used  Vaping Use  . Vaping Use: Never used  Substance and Sexual Activity  . Alcohol use:  Not Currently    Alcohol/week: 3.0 standard drinks    Types: 3 Cans of beer per week  . Drug use: Not Currently    Types: Marijuana  . Sexual activity: Not on file  Other Topics Concern  . Not on file  Social History Narrative  . Not on file   Social Determinants of Health   Financial Resource Strain: Not on file  Food Insecurity: Not on file  Transportation Needs: Not on file  Physical Activity: Not on file  Stress: Not on file  Social Connections: Not on file  Intimate Partner Violence: Not on file    Review of Systems: All systems reviewed and negative except where noted in HPI.    OBJECTIVE:    Physical Exam: Vital signs in last 24 hours: Temp:  [98.1 F (36.7 C)-99.5 F (37.5 C)] 99.1 F (37.3 C) (01/02 0900) Pulse Rate:  [75-112] 94 (01/02 1300) Resp:  [18-25] 24 (01/02 1300) BP: (114-139)/(77-93) 114/79 (01/02 1300) SpO2:  [96 %-99 %] 98 % (01/02 1300) Weight:  [70 kg] 70 kg (01/01 2312)   General:   Alert  male in NAD Psych:  Pleasant, cooperative. Normal mood and affect. Eyes:  Pupils equal, icteric sclerae. Ears:  Normal auditory acuity. Nose:  No deformity, discharge,  or lesions. Neck:  Supple; no masses Lungs:  Clear throughout to auscultation.   Marland Kitchen  Heart:  Regular rate , no lower extremity edema Abdomen:  Soft, non-distended, nontender, BS active, no palp mass. Small umbilical hernia Rectal:  Deferred  Msk:  Symmetrical without gross deformities. . Neurologic:  Alert and  oriented x4;  grossly normal neurologically. Skin:  Intact without significant lesions or rashes.  Filed Weights   07/08/20 2312  Weight: 70 kg     Scheduled inpatient medications . LORazepam  0-4 mg Intravenous Q6H   Or  . LORazepam  0-4 mg Oral Q6H  . [START ON 07/11/2020] LORazepam  0-4 mg Intravenous Q12H   Or  . [START ON 07/11/2020] LORazepam  0-4 mg Oral Q12H  . nicotine  21 mg Transdermal Daily  . pantoprazole (PROTONIX) IV  40 mg Intravenous Q24H  . thiamine   100 mg Oral Daily   Or  . thiamine  100 mg Intravenous Daily      Intake/Output from previous day: 01/01 0701 - 01/02 0700 In: 410.4 [Blood:360.4; IV Piggyback:50] Out: -  Intake/Output this shift: Total I/O In: 483.3 [I.V.:50; Blood:433.3] Out: -    Lab Results: Recent Labs    07/08/20 0005 07/09/20 0053 07/09/20 1240  WBC 7.0  --  8.1  HGB 6.7* 6.9* 9.5*  HCT 20.4* 20.8* 28.7*  PLT 75*  --  89*   BMET Recent Labs    07/08/20 0005 07/09/20 1240  NA 130* 135  K 3.6 3.6  CL 99 104  CO2 17* 20*  GLUCOSE 146* 100*  BUN <5* <5*  CREATININE 0.37* 0.37*  CALCIUM 7.5* 7.9*   LFT Recent Labs    07/09/20 1240  PROT 7.7  ALBUMIN 2.8*  AST 89*  ALT 17  ALKPHOS 213*  BILITOT 12.5*   PT/INR Recent Labs    07/09/20 0206  LABPROT 19.3*  INR 1.7*   Hepatitis Panel No results for input(s): HEPBSAG, HCVAB, HEPAIGM, HEPBIGM in the last 72 hours.   . CBC Latest Ref Rng & Units 07/09/2020 07/09/2020 07/08/2020  WBC 4.0 - 10.5 K/uL 8.1 - 7.0  Hemoglobin 13.0 - 17.0 g/dL 9.5(L) 6.9(LL) 6.7(LL)  Hematocrit 39.0 - 52.0 % 28.7(L) 20.8(L) 20.4(L)  Platelets 150 - 400 K/uL 89(L) - 75(L)    . CMP Latest Ref Rng & Units 07/09/2020 07/08/2020 01/12/2020  Glucose 70 - 99 mg/dL 100(H) 146(H) 84  BUN 6 - 20 mg/dL <5(L) <5(L) 3(L)  Creatinine 0.61 - 1.24 mg/dL 0.37(L) 0.37(L) 0.52(L)  Sodium 135 - 145 mmol/L 135 130(L) 135  Potassium 3.5 - 5.1 mmol/L 3.6 3.6 4.0  Chloride 98 - 111 mmol/L 104 99 99  CO2 22 - 32 mmol/L 20(L) 17(L) 22  Calcium 8.9 - 10.3 mg/dL 7.9(L) 7.5(L) 8.6(L)  Total Protein 6.5 - 8.1 g/dL 7.7 7.4 8.2  Total Bilirubin 0.3 - 1.2 mg/dL 12.5(H) 9.6(H) 2.4(H)  Alkaline Phos 38 - 126 U/L 213(H) 204(H) 377(H)  AST 15 - 41 U/L 89(H) 97(H) 45(H)  ALT 0 - 44 U/L '17 16 7   ' Studies/Results: US Abdomen Limited  Result Date: 07/09/2020 CLINICAL DATA:  50 year old male positive COVID-19. Cirrhosis. History of pancreatitis this summer. EXAM: ULTRASOUND ABDOMEN LIMITED  RIGHT UPPER QUADRANT COMPARISON:  CT Abdomen and Pelvis 11/23/2019. Ultrasound 11/23/2019. FINDINGS: Gallbladder: Thickened gallbladder wall up to 7 mm (4 mm in May) with possible trace pericholecystic fluid (image 5). However, decreased sludge within  the gallbladder lumen since May. No shadowing echogenic stones. No sonographic Murphy sign elicited. Common bile duct: Diameter: 4 mm, normal. Liver: Similar increased liver echogenicity. Coarse hepatic echotexture. No discrete liver lesion. Nodular liver contour evident on images 49 through 52. Portal vein is patent on color Doppler imaging with normal direction of blood flow towards the liver. Other: Negative visible right kidney. No free fluid identified. IMPRESSION: 1. Increased gallbladder wall thickening/edema since May, but absent gallstones and sonographic Murphy sign. Burtis Junes this is sequelae of cirrhosis rather than a calculus cholecystitis. If necessary Nuclear Medicine Hepatobiliary Scan could evaluate further. 2. Stable ultrasound appearance of cirrhotic liver with no discrete lesion. Electronically Signed   By: Genevie Ann M.D.   On: 07/09/2020 06:22   DG CHEST PORT 1 VIEW  Result Date: 07/09/2020 CLINICAL DATA:  Seizure today, smoker EXAM: PORTABLE CHEST 1 VIEW COMPARISON:  11/23/2019 chest radiograph. FINDINGS: Low lung volumes. Borderline mild enlargement of the cardiopericardial silhouette. Normal mediastinal contour. No pneumothorax. No pleural effusion. No pulmonary edema. Small vague nodular opacity overlying the peripheral right hemidiaphragm at the right lung base. IMPRESSION: Low lung volumes. Borderline mild enlargement of the cardiopericardial silhouette without pulmonary edema. Small vague nodular opacity overlying the peripheral right lung base, cannot exclude a pulmonary nodule versus nipple shadow. Suggest follow-up PA and lateral chest radiographs with nipple markers. Electronically Signed   By: Ilona Sorrel M.D.   On: 07/09/2020 06:08     Principal Problem:   Seizures ([place]) Active Problems:   Alcohol abuse   High bilirubin   Anemia   Thrombocytopenia ([place])   COVID-19 virus infection   GERD (gastroesophageal reflux disease)    Tye Savoy, NP-C @  07/09/2020, 2:06 PM   Attending physician's note   I have taken an interval history, reviewed the chart and examined the patient. I agree with the Advanced Practitioner's note, impression and recommendations.   Adm d/t SZ (d/t ETOH/noncompliance with meds)  ETOH hepatitis with underlying ETOH liver cirrhosis with portal hypertension. MELD 24. DF>32 but decided against steroids d/t slow GI bleed. Assoc jaundice, coagulopathy. No ascites on [place]  Anemia due to chronic blood loss (heme pos).  No overt bleeding. Hb 6.7 s/p 2U to 9.5.  Continued alcohol abuse with H/O pancreatitis with pseudocyst in past  Covid 19+.  No vaccination. No PNA on CxR.  Hopefully will remain asymptomatic.  Plan: -IV Protonix -Trend CBC.  Transfuse if needed. -Supportive treatment for now. -Vit K to correct coagulopathy d/t malnutrition -Watch for overt bleeding, hepatic encephalopathy. -Trend LFTs, PT -Watch for alcohol withdrawal. -Once he is out of isolation for COVID-19, would recommend CT Abdo/pelvis with p.o. and IV contrast followed by EGD/colonoscopy as inpt/outpt depending upon clinical course. -Dr. Hilarie Fredrickson taking over the service from tomorrow onwards.    Carmell Austria, MD [facility] [phone]

## 2020-07-09 NOTE — H&P (Signed)
History and Physical    CORBAN KISTLER EVO:[id number] DOB: 1971/07/07 DOA: 07/08/2020  PCP: Charlott Rakes, MD Patient coming from: Home  Chief Complaint: Seizures  HPI: Gary Mitchell is a 50 y.o. male with medical history significant of alcoholism, alcohol withdrawal seizures, alcohol-related pancreatitis, alcoholic hepatitis, cirrhosis, cholelithiasis, tobacco use, GERD presenting to the ED via EMS after he had 2 witnessed generalized tonic-clonic seizures at home.  He was postictal when EMS arrived.  He refused transport after the first seizure.  Family reported that each seizure lasted 5 minutes or less.  Patient reports noncompliance with his home antiepileptic medication regimen (Dilantin and Keppra).  States he has not taken his medications for several days as he forgets to take them.  States he normally drinks 1/5 of liquor every day but stopped drinking alcohol a week ago.  Reports chills, cough, and shortness of breath.  Denies fevers, chest pain, nausea, vomiting, or abdominal pain.  He has never had a colonoscopy or endoscopy done.  He has not been vaccinated against Covid.  ED Course: Afebrile.  Slightly tachycardic.  WBC 7.0, hemoglobin 6.7, hematocrit 20.4, platelet count 75K.  Sodium 130, potassium 3.6, chloride 99, bicarb 17, anion gap 14, BUN <5, creatinine 0.3, glucose 146.  INR 1.7.  AST 97, ALT 16, alk phos 204, T bili 9.6.  Lipase normal.  Blood ethanol level undetectable.  Phenytoin level undetectable.  Magnesium level 1.2.  UA not suggestive of infection.  UDS negative.  Repeat H&H showing hemoglobin 6.9.  Hemoccult positive but no grossly bloody stools or melena noted.  Screening SARS-CoV-2 PCR test and influenza panel pending.  Patient was given 1 L normal saline bolus, IV magnesium 2 g, loading dose IV Keppra 1000 mg, and loading dose of IV Dilantin 800 mg.  2 units PRBCs ordered.  Review of Systems:  All systems reviewed and apart from history of presenting illness, are  negative.  Past Medical History:  Diagnosis Date  . 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 Seizures ([place])     Past Surgical History:  Procedure Laterality Date  . left kidney removed       reports that he has been smoking. He has a 50.00 pack-year smoking history. He has never used smokeless tobacco. He reports previous alcohol use of about 3.0 standard drinks of alcohol per week. He reports previous drug use. Drug: Marijuana.  Allergies  Allergen Reactions  . Iohexol Other (See Comments)    Unknown reaction at 55 days old    Family History  Problem Relation Age of Onset  . Diabetes Mellitus II Mother   . Colon cancer Neg Hx   . Esophageal cancer Neg Hx     Prior to Admission medications   Medication Sig Start Date End Date Taking? Authorizing Provider  levETIRAcetam (KEPPRA) 500 MG tablet Take 1 tablet (500 mg total) by mouth 2 (two) times daily. 01/12/20  Yes Charlott Rakes, MD  phenytoin (DILANTIN) 100 MG ER capsule Take 3 capsules (300 mg total) by mouth daily. 11/29/19  Yes Cherene Altes, MD  naltrexone (DEPADE) 50 MG tablet Take 1 tablet (50 mg total) by mouth daily. 01/12/20   Charlott Rakes, MD  pantoprazole (PROTONIX) 40 MG tablet Take 1 tablet (40 mg total) by mouth daily. Patient not taking: Reported on 07/08/2020 01/12/20   Charlott Rakes, MD  vitamin k 100 MCG tablet Take 100 mcg by mouth daily. For 60 days per patient  Patient not taking: Reported on 07/08/2020    [provider]    Physical Exam: Vitals:   07/09/20 0252 07/09/20 0345 07/09/20 0430 07/09/20 0500  BP: 132/85 116/84 121/77 121/84  Pulse: (!) 107 99 98   Resp: 19  20 (!) 22  Temp: 99.3 F (37.4 C)     TempSrc: Oral     SpO2:  96% 96% 97%  Weight:      Height:        Physical Exam Constitutional:      General: He is not in acute distress. HENT:     Head: Normocephalic and atraumatic.  Eyes:     Extraocular Movements: Extraocular movements intact.      Comments: Scleral icterus  Cardiovascular:     Rate and Rhythm: Normal rate and regular rhythm.     Pulses: Normal pulses.  Pulmonary:     Effort: Pulmonary effort is normal. No respiratory distress.     Breath sounds: No wheezing.  Abdominal:     General: Bowel sounds are normal. There is no distension.     Palpations: Abdomen is soft.     Tenderness: There is no abdominal tenderness.  Musculoskeletal:        General: No swelling or tenderness.     Cervical back: Normal range of motion and neck supple.  Skin:    General: Skin is warm and dry.  Neurological:     General: No focal deficit present.     Mental Status: He is alert and oriented to person, place, and time.     Labs on Admission: I have personally reviewed following labs and imaging studies  CBC: Recent Labs  Lab 07/08/20 0005 07/09/20 0053  WBC 7.0  --   NEUTROABS 5.4  --   HGB 6.7* 6.9*  HCT 20.4* 20.8*  MCV 72.9*  --   PLT 75*  --    Basic Metabolic Panel: Recent Labs  Lab 07/08/20 0005 07/09/20 0005  NA 130*  --   K 3.6  --   CL 99  --   CO2 17*  --   GLUCOSE 146*  --   BUN <5*  --   CREATININE 0.37*  --   CALCIUM 7.5*  --   MG  --  1.2*   GFR: Estimated Creatinine Clearance: 103 mL/min (A) (by C-G formula based on SCr of 0.37 mg/dL (L)). Liver Function Tests: Recent Labs  Lab 07/08/20 0005  AST 97*  ALT 16  ALKPHOS 204*  BILITOT 9.6*  PROT 7.4  ALBUMIN 2.7*   Recent Labs  Lab 07/09/20 0005  LIPASE 12   No results for input(s): AMMONIA in the last 168 hours. Coagulation Profile: Recent Labs  Lab 07/09/20 0206  INR 1.7*   Cardiac Enzymes: No results for input(s): CKTOTAL, CKMB, CKMBINDEX, TROPONINI in the last 168 hours. BNP (last 3 results) No results for input(s): PROBNP in the last 8760 hours. HbA1C: No results for input(s): HGBA1C in the last 72 hours. CBG: Recent Labs  Lab 07/08/20 2317  GLUCAP 158*   Lipid Profile: No results for input(s): CHOL, HDL, LDLCALC,  TRIG, CHOLHDL, LDLDIRECT in the last 72 hours. Thyroid Function Tests: No results for input(s): TSH, T4TOTAL, FREET4, T3FREE, THYROIDAB in the last 72 hours. Anemia Panel: No results for input(s): VITAMINB12, FOLATE, FERRITIN, TIBC, IRON, RETICCTPCT in the last 72 hours. Urine analysis:    Component Value Date/Time   COLORURINE AMBER (A) 07/09/2020 0020   APPEARANCEUR CLEAR 07/09/2020 0020  LABSPEC 1.006 07/09/2020 0020   PHURINE 7.0 07/09/2020 0020   GLUCOSEU NEGATIVE 07/09/2020 0020   HGBUR SMALL (A) 07/09/2020 0020   BILIRUBINUR SMALL (A) 07/09/2020 0020   KETONESUR 5 (A) 07/09/2020 0020   PROTEINUR 30 (A) 07/09/2020 0020   NITRITE NEGATIVE 07/09/2020 0020   LEUKOCYTESUR NEGATIVE 07/09/2020 0020    Radiological Exams on Admission: No results found.  EKG: Independently reviewed.  Sinus tachycardia, QTC 488.  Assessment/Plan Principal Problem:   Seizures ([place]) Active Problems:   Alcohol abuse   High bilirubin   Anemia   Thrombocytopenia ([facility])   COVID-19 virus infection   GERD (gastroesophageal reflux disease)   Seizures: Patient had 2 witnessed generalized tonic-clonic seizures at home.  He was postictal at the time of EMS arrival.  Suspect seizures are due to alcohol withdrawal and antiepileptic medication noncompliance.  He has a history of alcoholism and blood ethanol level undetectable on labs.  He is supposed to be taking Keppra and Dilantin at home.  Labs showing undetectable Dilantin level.  Patient was given loading dose IV Keppra 1 g and loading dose IV Dilantin 800 mg in the ED.  No further seizure activity.  Currently awake and alert. -Pharmacy consulted for Dilantin dosing, recommending continuing Dilantin at a maintenance dose of 400 mg daily.  Continue Keppra 500 mg twice daily.  EEG ordered.  Follow seizure precautions.  COVID-19 infection: He is unvaccinated.  Screening SARS-CoV-2 PCR test positive.  No fever.  Not hypoxic or tachypneic. However, does endorse  cough and shortness of breath. -Chest x-ray ordered.  Check inflammatory markers.  Continuous pulse ox.  Airborne and contact precautions.  Alcoholism: Blood ethanol level undetectable.  Slightly tachycardic and had seizures at home.  No other signs of withdrawal at this time. -CIWA protocol; Ativan as needed.  Thiamine, folate, and multivitamin.  Monitor mag and Phos levels.  Elevated T bili: Does have scleral icterus on exam.  T bili significantly elevated at 9.6, was previously 2.4 on labs done in July.  History of alcoholic hepatitis, cirrhosis, and alcohol related pancreatitis.  AST slightly up from baseline.  Alk phos improved compared to prior labs.  ALT level normal.  Lipase normal.  He has known cholelithiasis but not endorsing any abdominal pain, nausea, or vomiting. No fever or leukocytosis. -Right upper quadrant ultrasound ordered.  Secure chat message sent to Dr. Lyndel Safe from [facility] requesting consultation in the morning.  Acute on chronic anemia, Hemoccult positive: Hemoglobin 6.9, was above 11 back in July 2021.  Hemoccult positive but no grossly bloody stools or melena. -Secure chat message sent to GI requesting consultation in the morning.  2 units PRBCs ordered in the ED.  Follow-up posttransfusion H&H.  Thrombocytopenia: Appears new.  Platelet count 75K.  Does have history of alcoholism but platelet count has been normal previously.  Last set of labs in the chart from 6 months ago.  Infection felt to be less likely given no fever or leukocytosis. -Peripheral blood smear ordered.  Check folate, B12, and copper levels.  HIV and hep C testing.  Continue to monitor platelet count closely.   Mild chronic hyponatremia: Likely due to chronic alcohol use.  Sodium 130, stable compared to prior labs. -Continue to monitor  Normal anion gap metabolic acidosis: Bicarb 17, anion gap 14. -Check lactic acid level.  Received IV fluids, repeat metabolic panel.  Tobacco use: Smokes 2 packs of  cigarettes daily. -NicoDerm patch and counseling.  GERD -Continue PPI  Hypomagnesemia: Magnesium level  1.2. -Replete and continue to monitor  Borderline QT prolongation on EKG -Cardiac monitoring.  Monitor potassium and magnesium levels.  Avoid QT prolonging drugs if possible.  DVT prophylaxis: SCDs Code Status: Full code Family Communication: No family available at this time. Disposition Plan: Status is: Inpatient  Remains inpatient appropriate because:IV treatments appropriate due to intensity of illness or inability to take PO and Inpatient level of care appropriate due to severity of illness   Dispo: The patient is from: Home              Anticipated d/c is to: Home              Anticipated d/c date is: 3 days              Patient currently is not medically stable to d/c.  The medical decision making on this patient was of high complexity and the patient is at high risk for clinical deterioration, therefore this is a level 3 visit.  Shela Leff MD [facility]  If 7PM-7AM, please contact night-coverage [email or web address]  07/09/2020, 5:12 AM

## 2020-07-09 NOTE — ED Notes (Signed)
Patient will not stay in bed even with seizure pads present and both siderails are up. Patient continues to remove heart monitor and BP cuff off. Patient has IV line stretched to the sink. Patient already pulled 1 IV out and disconnected the current IV

## 2020-07-09 NOTE — ED Notes (Signed)
Pt informed that we need a urine sample. Pt states that he will let me know when he is able to provide a sample.

## 2020-07-09 NOTE — ED Notes (Addendum)
Date and time results received: 07/09/20 0444   Test: Covid  Critical Value: Positive  Name of Provider Notified: Dr. Elesa Massed

## 2020-07-09 NOTE — ED Notes (Signed)
Date and time results received: 07/09/20 12:50 AM  (use smartphrase ".now" to insert current time)  Test: hemoglobin Critical Value: 6.7  Name of Provider Notified: Dr. Elesa Massed, MD  Orders Received? Or Actions Taken?: H&H re-check

## 2020-07-09 NOTE — Progress Notes (Signed)
PROGRESS NOTE  Gary Mitchell  DOB: 31-Mar-1971  PCP: Charlott Rakes, MD EXH:[id number]  DOA: 07/08/2020  LOS: 0 days   Chief Complaint  Patient presents with  . Seizures   Brief narrative: LEV CERVONE is a 50 y.o. male with PMH significant for chronic alcoholism, alcohol withdrawal seizures, alcohol-related pancreatitis, alcoholic hepatitis, cirrhosis, cholelithiasis, tobacco use, GERD. Patient states that he quit alcohol a week ago.  Patient was brought to the ED on 1/1 after he had  2 witnessed generalized tonic-clonic seizures at home.  He was postictal when EMS arrived.  He first refused transport the first seizure.  Family reported that each seizure lasted 5 minutes or less.  Patient reports noncompliance with his home antiepileptic medication regimen (Dilantin and Keppra).  States he has not taken his medications for several days as he forgets to take them.  States he normally drinks 1/5 of liquor every day but stopped drinking alcohol a week ago.  He has not been vaccinated against Covid.  In the ED, patient was afebrile, slightly tachycardic. Labs with WBC 7.0, hemoglobin 6.7, repeat was 6.9. platelet count 75K.  Sodium 130, potassium 3.6, magnesium level low at 1.2 chloride 99, bicarb 17, anion gap 14, BUN <5, creatinine 0.3, glucose 146.  INR 1.7.  AST 97, ALT 16, alk phos 204, T bili 9.6.  Lipase normal.   Blood ethanol level undetectable.   Phenytoin level undetectable.  UA not suggestive of infection.   UDS negative.   Hemoccult positive but no grossly bloody stools or melena noted. Screening SARS-CoV-2 PCR test positive. Janett Billow did not show any evidence of pneumonia.   Patient was given 1 L normal saline bolus, IV magnesium 2 g, loading dose IV Keppra 1000 mg, and loading dose of IV Dilantin 800 mg.   2 units PRBCs ordered. Admitted to hospitalist service. GI consultation was obtained.   Subjective: Patient was seen and examined this morning. Middle-aged  African-American male.  Looks older for his age.  Not in distress. He was not in postictal phase at the time of my evaluation.  Assessment/Plan: Generalized tonic-clonic seizure History of alcohol-related seizures Noncompliant to antiepileptics -Supposed to be on Dilantin and Keppra at home.  Undetectable levels -Home meds resumed.  Covid positive -Incidental Covid positive.  Asymptomatic.  No pneumonia noted on chest x-ray -Not on treatment  Acute on chronic anemia Iron deficiency anemia Chronic GI blood loss because of liver cirrhosis -GI consult obtained. -2 units of PRBC transfused.  Repeat hemoglobin is better at 9.5 this afternoon. Recent Labs    12/27/19 1106 01/12/20 1043 07/08/20 0005 07/09/20 0053 07/09/20 1240  HGB 11.0* 11.1* 6.7* 6.9* 9.5*  MCV 91.2 86 72.9*  --  76.7*  VITAMINB12  --   --   --   --  787  FOLATE  --   --   --   --  6.5  FERRITIN  --   --   --   --  25   Chronic alcoholism Alcoholic liver cirrhosis with portal hypertension -Meld sodium score of 24. -He states he quit alcohol a week ago.  Questionable reliability. -No ascites on ultrasound. -GI consult appreciated.  EGD inpatient versus outpatient. -Continue PPI. -Monitor for possible withdrawal symptoms.  History of alcoholic pancreatitisand pseudocyst  Chronic thrombocytopenia -Related to liver cirrhosis.  Continue monitor Recent Labs  Lab 07/08/20 0005 07/09/20 1240  PLT 75* 89*   Hyponatremia -Related to liver cirrhosis.  Continue to monitor Recent Labs  Lab  07/08/20 0005 07/09/20 1240  NA 130* 135   Hypomagnesemia/hypophosphatemia -Levels low.  Replacement given.  Repeat tomorrow. Recent Labs  Lab 07/08/20 0005 07/09/20 0005 07/09/20 1240  K 3.6  --  3.6  MG  --  1.2* 1.8  PHOS  --   --  1.8*   Mobility: Encourage ambulation Code Status:   Code Status: Full Code  Nutritional status: Body mass index is 24.91 kg/m.     Diet Order            Diet 2 gram  sodium Room service appropriate? Yes; Fluid consistency: Thin  Diet effective now                 DVT prophylaxis: SCDs Start: 07/09/20 0458   Antimicrobials:  None Fluid: Reduce normal range of 50 mill per hour Consultants: GI Family Communication:  Family not at bedside  Status is: Inpatient  Remains inpatient appropriate because: Pending GI evaluation  Dispo: The patient is from: Home              Anticipated d/c is to: Home               Anticipated d/c date is: If no inpatient GI procedure required, will be discharged home tomorrow              Patient currently is not medically stable to d/c.       Infusions:  . sodium chloride 125 mL/hr at 07/09/20 0001  . levETIRAcetam 500 mg (07/09/20 0939)  . phenytoin (DILANTIN) IV      Scheduled Meds: . LORazepam  0-4 mg Intravenous Q6H   Or  . LORazepam  0-4 mg Oral Q6H  . [START ON 07/11/2020] LORazepam  0-4 mg Intravenous Q12H   Or  . [START ON 07/11/2020] LORazepam  0-4 mg Oral Q12H  . nicotine  21 mg Transdermal Daily  . pantoprazole (PROTONIX) IV  40 mg Intravenous Q24H  . phosphorus  500 mg Oral Q4H  . thiamine  100 mg Oral Daily   Or  . thiamine  100 mg Intravenous Daily    Antimicrobials: Anti-infectives (From admission, onward)   None      PRN meds:    Objective: Vitals:   07/09/20 1500 07/09/20 1510  BP: 122/80   Pulse: 87 88  Resp:    Temp:    SpO2:      Intake/Output Summary (Last 24 hours) at 07/09/2020 1728 Last data filed at 07/09/2020 0900 Gross per 24 hour  Intake 893.75 ml  Output --  Net 893.75 ml   Filed Weights   07/08/20 2312  Weight: 70 kg   Weight change:  Body mass index is 24.91 kg/m.   Physical Exam: General exam: Middle-aged male.  Not in distress Skin: No rashes, lesions or ulcers. HEENT: Atraumatic, normocephalic, no obvious bleeding Lungs: Clear to auscultation bilaterally CVS: Regular rate and rhythm, no murmur GI/Abd soft, nontender, nondistended, bowel  sound present CNS: Alert, awake, oriented x3 Psychiatry: Mood appropriate Extremities: No pedal edema, no calf tenderness  Data Review: I have personally reviewed the laboratory data and studies available.  Recent Labs  Lab 07/08/20 0005 07/09/20 0053 07/09/20 1240  WBC 7.0  --  8.1  NEUTROABS 5.4  --   --   HGB 6.7* 6.9* 9.5*  HCT 20.4* 20.8* 28.7*  MCV 72.9*  --  76.7*  PLT 75*  --  89*   Recent Labs  Lab 07/08/20 0005 07/09/20 0005 07/09/20  1240  NA 130*  --  135  K 3.6  --  3.6  CL 99  --  104  CO2 17*  --  20*  GLUCOSE 146*  --  100*  BUN <5*  --  <5*  CREATININE 0.37*  --  0.37*  CALCIUM 7.5*  --  7.9*  MG  --  1.2* 1.8  PHOS  --   --  1.8*    F/u labs ordered.  Signed, Terrilee Croak, MD [facility] 07/09/2020

## 2020-07-09 NOTE — Progress Notes (Signed)
Phenytoin Follow-Up Consult Indication:   Allergies  Allergen Reactions  . Iohexol Other (See Comments)    Unknown reaction at 18 days old    Patient Measurements: Height: 5\' 6"  (167.6 cm) Weight: 70 kg (154 lb 5.2 oz) IBW/kg (Calculated) : 63.8 TPN AdjBW (KG): 70 Body mass index is 24.91 kg/m.   Vital signs: Temp: 99.5 F (37.5 C) (01/01 2309) Temp Source: Oral (01/01 2309) BP: 130/85 (01/01 2345) Pulse Rate: 101 (01/01 2345)  Labs: No results found for: ALBUMIN, PHENYTOIN, PHENYTFREE Lab Results  Component Value Date   PHENYTOIN <2.5 (L) 07/08/2020   Estimated Creatinine Clearance: 103 mL/min (A) (by C-G formula based on SCr of 0.37 mg/dL (L)).   Medications:  PTA: keppra 500mg  po twice daily Phenytoin 300mg  po once daily  Assessment: Corrected phenytoin level (if needed): 3.9mg /ml Seizure activity: yes Significant potential drug interactions: none  Goals of care:  Total phenytoin level: 10-20 mcg/ml Free phenytoin level: 1-2 mcg/ml  Plan:  Load phenytoin 800mg  IV x 1 Non-compliance with medications? Increase maintenance dose to 400mg  once daily F/u when can take po's Check phenytoin level in 5-7 days    09/05/2020 RPh 07/09/2020, 1:34 AM

## 2020-07-09 NOTE — ED Notes (Signed)
Patient continues to get up and move around room, removing heart monitor, BP cuff and pulse ox. Patient also pulled his first IV out.

## 2020-07-10 ENCOUNTER — Other Ambulatory Visit (HOSPITAL_COMMUNITY): Payer: Self-pay | Admitting: Internal Medicine

## 2020-07-10 DIAGNOSIS — F1010 Alcohol abuse, uncomplicated: Secondary | ICD-10-CM | POA: Diagnosis not present

## 2020-07-10 DIAGNOSIS — R569 Unspecified convulsions: Secondary | ICD-10-CM | POA: Diagnosis not present

## 2020-07-10 DIAGNOSIS — D509 Iron deficiency anemia, unspecified: Secondary | ICD-10-CM

## 2020-07-10 LAB — TYPE AND SCREEN
ABO/RH(D): O POS
Antibody Screen: NEGATIVE
Unit division: 0
Unit division: 0

## 2020-07-10 LAB — BPAM RBC
Blood Product Expiration Date: 202202012359
Blood Product Expiration Date: 202202012359
ISSUE DATE / TIME: 202201020231
ISSUE DATE / TIME: 202201020521
Unit Type and Rh: 5100
Unit Type and Rh: 5100

## 2020-07-10 MED ORDER — VITAMIN K 100 MCG PO TABS: 100.0000 ug | ORAL_TABLET | Freq: Every day | ORAL | 2 refills | Status: DC

## 2020-07-10 MED ORDER — LEVETIRACETAM 500 MG PO TABS: 500.0000 mg | ORAL_TABLET | Freq: Two times a day (BID) | ORAL | 2 refills | Status: DC

## 2020-07-10 MED ORDER — SODIUM CHLORIDE 0.9 % IV SOLN: 300.0000 mg | Freq: Every day | INTRAVENOUS | Status: DC

## 2020-07-10 MED ORDER — PANTOPRAZOLE SODIUM 40 MG PO TBEC: 40.0000 mg | DELAYED_RELEASE_TABLET | Freq: Every day | ORAL | 2 refills | Status: DC

## 2020-07-10 MED ORDER — HYDROXYZINE HCL 50 MG/ML IM SOLN
50.0000 mg | Freq: Four times a day (QID) | INTRAMUSCULAR | Status: DC | PRN
  Administered 2020-07-10: 50 mg via INTRAMUSCULAR
  Filled 2020-07-10: qty 1

## 2020-07-10 MED ORDER — PHENYTOIN SODIUM EXTENDED 100 MG PO CAPS: 300.0000 mg | ORAL_CAPSULE | Freq: Every day | ORAL | 2 refills | Status: DC

## 2020-07-10 MED ORDER — VITAMIN K 100 MCG PO TABS: 100.0000 ug | ORAL_TABLET | Freq: Every day | ORAL | 2 refills | Status: AC

## 2020-07-10 MED ORDER — HALOPERIDOL LACTATE 5 MG/ML IJ SOLN
5.0000 mg | Freq: Once | INTRAMUSCULAR | Status: AC
  Administered 2020-07-10: 5 mg via INTRAVENOUS
  Filled 2020-07-10: qty 1

## 2020-07-10 MED ORDER — DIAZEPAM 5 MG PO TABS
5.0000 mg | ORAL_TABLET | Freq: Three times a day (TID) | ORAL | Status: DC | PRN
  Administered 2020-07-10: 5 mg via ORAL
  Filled 2020-07-10: qty 1

## 2020-07-10 MED FILL — PANTOPRAZOLE SOD DR 40 MG T: 40 | 30 days supply | Qty: 30 | Fill #0

## 2020-07-10 MED FILL — VITAMIN K2 100 MCG TABS: 100 | 30 days supply | Qty: 30 | Fill #0

## 2020-07-10 MED FILL — levETIRAcetam 500 MG TABS: 500 | 30 days supply | Qty: 60 | Fill #0

## 2020-07-10 MED FILL — PHENYTOIN SOD EXT 100 MG CA: 100 | 30 days supply | Qty: 90 | Fill #0

## 2020-07-10 MED FILL — PHENYTOIN SODIUM EXTENDED 1: 100 | 30 days supply | Qty: 90 | Fill #0

## 2020-07-10 NOTE — ED Notes (Signed)
RN attempted IV x3. Patient difficult stick plus pt moves when being stuck

## 2020-07-10 NOTE — ED Notes (Signed)
Patient continues to be aggressive, confused, pulling at lines, threatening to hit staff and keeps attempting to get up despite multiple medications and interventions.

## 2020-07-10 NOTE — ED Notes (Signed)
Patient repositioned in bed. Cuffs adjusted as patient is still confused and disoriented. Patient assisted to lay head on towel.  Patient's room tidied.

## 2020-07-10 NOTE — Progress Notes (Signed)
Phenytoin Follow-Up Consult Indication:   Allergies  Allergen Reactions  . Iohexol Other (See Comments)    Unknown reaction at 50 days old    Patient Measurements: Height: 5\' 6"  (167.6 cm) Weight: 70 kg (154 lb 5.2 oz) IBW/kg (Calculated) : 63.8 TPN AdjBW (KG): 70 Body mass index is 24.91 kg/m.   Vital signs: Temp: 98.7 F (37.1 C) (01/03 0159) Temp Source: Oral (01/03 0159) BP: 135/95 (01/03 0730) Pulse Rate: 107 (01/03 0730)  Labs: Lab Results  Component Value Date/Time   Albumin 2.8 (L) 07/09/2020 1240   Lab Results  Component Value Date   PHENYTOIN <2.5 (L) 07/08/2020   Estimated Creatinine Clearance: 103 mL/min (A) (by C-G formula based on SCr of 0.37 mg/dL (L)).   Medications:  PTA: keppra 500mg  po twice daily Phenytoin 300mg  po once daily  Assessment: Corrected phenytoin level (if needed): 3.9mg /ml Seizure activity: yes Significant potential drug interactions: none  07/10/20 8:59 AM  - patient reports not taking seizure medications x several days, in addition, patient stopped drinking about 1 week ago -  SCr wnl and stable - albumin 2.8 - would not want to adjust home regimen as recent seizures are most likely related to alcohol withdrawal and non-compliance with seizure medications.   Goals of care:  Total phenytoin level: 10-20 mcg/ml Free phenytoin level: 1-2 mcg/ml  Plan:  Due to reported non-compliance as most likely cause of seizures and not medication failure, will resume home dose of phenytoin 300 mg daily to avoid increased risk of side effects with increased dosing F/u when can take po's Check phenytoin level in 5-7 days   07/10/2020, 8:58 AM

## 2020-07-10 NOTE — TOC Initial Note (Signed)
Transition of Care [facility]) - Initial/Assessment Note    Patient Details  Name: Gary Mitchell MRN: [id number] Date of Birth: 04-18-1971  [facility]) CM/SW Contact:    Elliot Cousin, RN Phone Number:  [phone] 07/10/2020, 3:25 PM  Clinical Narrative:                 TOC CM spoke to pt and he uses [facility] for his medication. Pt has used [facility] in the past. Spoke to [facility] and they can do the one time free fill for his medications. Pt will need to arrange appt to [facility].    Expected Discharge Plan: Home/Self Care Barriers to Discharge: No Barriers Identified   Patient Goals and CMS Choice        Expected Discharge Plan and Services Expected Discharge Plan: Home/Self Care In-house Referral: Clinical Social Work Discharge Planning Services: CM Consult,Medication Assistance     Expected Discharge Date: 07/10/20                                    Prior Living Arrangements/Services   Lives with:: Parents Patient language and need for interpreter reviewed:: Yes Do you feel safe going back to the place where you live?: Yes      Need for Family Participation in Patient Care: No (Comment) Care giver support system in place?: No (comment)   Criminal Activity/Legal Involvement Pertinent to Current Situation/Hospitalization: No - Comment as needed  Activities of Daily Living Home Assistive Devices/Equipment: None ADL Screening (condition at time of admission) Patient's cognitive ability adequate to safely complete daily activities?: Yes Is the patient deaf or have difficulty hearing?: No Does the patient have difficulty seeing, even when wearing glasses/contacts?: No Does the patient have difficulty concentrating, remembering, or making decisions?: Yes (secondary to seizures) Patient able to express need for assistance with ADLs?: Yes Does the patient have difficulty dressing or bathing?: No Independently performs ADLs?: Yes (appropriate  for developmental age) Does the patient have difficulty walking or climbing stairs?: Yes (secondary to weakness) Weakness of Legs: Both Weakness of Arms/Hands: None  Permission Sought/Granted Permission sought to share information with : Case Manager,PCP Permission granted to share information with : Yes, Verbal Permission Granted              Emotional Assessment           Psych Involvement: No (comment)  Admission diagnosis:  Seizures (HCC) [R56.9] Patient Active Problem List   Diagnosis Date Noted  . Seizures (HCC) 07/09/2020  . High bilirubin 07/09/2020  . Anemia 07/09/2020  . Thrombocytopenia (HCC) 07/09/2020  . COVID-19 virus infection 07/09/2020  . GERD (gastroesophageal reflux disease) 07/09/2020  . Pancreatic mass 11/29/2019  . Acute pancreatitis 11/23/2019  . Alcoholic hepatitis 11/23/2019  . Gallstones 11/23/2019  . Alcohol abuse 11/23/2019   PCP:  Hoy Register, MD Pharmacy:   [facility] - [place], [place] - [address] [place] [address] Phone: [phone] Fax: [phone]  [id number] - [place], [place] - [address] [place] [address] Phone: [phone] Fax: [phone]  [facility] - [place], [place] - [address] [place] [address] Phone: [phone] Fax: [phone]  [facility] - [place], [place] - [address] [place]  [id number] Phone: [phone] Fax: [phone]     Social Determinants of Health (SDOH) Interventions    Readmission Risk Interventions Readmission Risk Prevention Plan 11/24/2019  Post Dischage Appt Complete  Medication Screening Complete  Transportation Screening Complete  Some recent data might be hidden

## 2020-07-10 NOTE — Discharge Summary (Signed)
Physician Discharge Summary  Gary Mitchell TLX:[id number] DOB: 1970-08-07 DOA: 07/08/2020  PCP: Charlott Rakes, MD  Admit date: 07/08/2020 Discharge date: 07/10/2020  Admitted From: Home Discharge disposition: Home   Code Status: Full Code  Diet Recommendation: Cardiac diet  Discharge Diagnosis:   Principal Problem:   Seizures ([place]) Active Problems:   Alcohol abuse   High bilirubin   Anemia   Thrombocytopenia ([place])   COVID-19 virus infection   GERD (gastroesophageal reflux disease)   Chief Complaint  Patient presents with  . Seizures   Brief narrative: Gary Mitchell is a 50 y.o. male with PMH significant for chronic alcoholism, alcohol withdrawal seizures, alcohol-related pancreatitis, alcoholic hepatitis, cirrhosis, cholelithiasis, tobacco use, GERD. Patient states that he quit alcohol a week ago.  Patient was brought to the ED on 1/1 after he had  2 witnessed generalized tonic-clonic seizures at home.  He was postictal when EMS arrived.  He first refused transport the first seizure.  Family reported that each seizure lasted 5 minutes or less.  Patient reports noncompliance with his home antiepileptic medication regimen (Dilantin and Keppra).  States he has not taken his medications for several days as he forgets to take them.  States he normally drinks 1/5 of liquor every day but stopped drinking alcohol a week ago.  He has not been vaccinated against Covid.  In the ED, patient was afebrile, slightly tachycardic. Labs with WBC 7.0, hemoglobin 6.7, repeat was 6.9. platelet count 75K.  Sodium 130, potassium 3.6, magnesium level low at 1.2 chloride 99, bicarb 17, anion gap 14, BUN <5, creatinine 0.3, glucose 146.  INR 1.7.  AST 97, ALT 16, alk phos 204, T bili 9.6.  Lipase normal.   Blood ethanol level undetectable.   Phenytoin level undetectable.  UA not suggestive of infection.   UDS negative.   Hemoccult positive but no grossly bloody stools or melena noted. Screening  SARS-CoV-2 PCR test positive. Janett Billow did not show any evidence of pneumonia.   Patient was given 1 L normal saline bolus, IV magnesium 2 g, loading dose IV Keppra 1000 mg, and loading dose of IV Dilantin 800 mg.   2 units PRBCs ordered. Admitted to hospitalist service. GI consultation was obtained.   Subjective: Patient was seen and examined this morning. Middle-aged African-American male.   He had received Ativan prior to my evaluation he was sedated.  Noted episodes of confusion/agitation last night.  Hospital course: Generalized tonic-clonic seizure History of alcohol-related seizures Noncompliant to antiepileptics -Supposed to be on Dilantin and Keppra at home.  Undetectable levels -Home meds resumed.  Covid positive -Incidental Covid positive.  Asymptomatic.  No pneumonia noted on chest x-ray -Not on treatment  Acute on chronic anemia Iron deficiency anemia -Chronic GI blood loss because of liver cirrhosis -GI consult obtained. -2 units of PRBC transfused.  Repeat hemoglobin is better at 9.5 after transfusion. -GI consult appreciated.  Continue Protonix.  Follow-up with GI as an outpatient.  Repeat CT abdomen pelvis as well as EGD and colonoscopy after Covid isolation.  2 weeks ago. Recent Labs    12/27/19 1106 01/12/20 1043 07/08/20 0005 07/09/20 0053 07/09/20 1240  HGB 11.0* 11.1* 6.7* 6.9* 9.5*  MCV 91.2 86 72.9*  --  76.7*  VITAMINB12  --   --   --   --  787  FOLATE  --   --   --   --  6.5  FERRITIN  --   --   --   --  25   Chronic alcoholism Alcoholic liver cirrhosis with portal hypertension -Meld sodium score of 24. -He states he quit alcohol a week ago.  But patient had withdrawal symptoms last night managed with IV Ativan. -No ascites on ultrasound. -Continue PPI.  History of alcoholic pancreatitisand pseudocyst  Chronic thrombocytopenia -Related to liver cirrhosis.  Continue monitor Recent Labs  Lab 07/08/20 0005 07/09/20 1240  PLT 75* 89*    Hyponatremia -Related to liver cirrhosis.  Continue to monitor Recent Labs  Lab 07/08/20 0005 07/09/20 1240  NA 130* 135   Hypomagnesemia/hypophosphatemia -Levels low.  Replacement given.   Recent Labs  Lab 07/08/20 0005 07/09/20 0005 07/09/20 1240  K 3.6  --  3.6  MG  --  1.2* 1.8  PHOS  --   --  1.8*   Stable for discharge to home today   Wound care:    Discharge Exam:   Vitals:   07/10/20 1215 07/10/20 1230 07/10/20 1245 07/10/20 1300  BP:  119/82  102/75  Pulse: 88 94 94 95  Resp: 17 (!) _0 Temp:      TempSrc:      SpO2: 99% 97% 96% 96%  Weight:      Height:        Body mass index is 24.91 kg/m.  General exam: Pleasant, middle-aged [place] American male.  Not in distress Skin: No rashes, lesions or ulcers. HEENT: Atraumatic, normocephalic, no obvious bleeding Lungs: Clear to auscultation bilaterally CVS: Regular rate and rhythm, no murmur GI/Abd soft, nontender, nondistended, bowel sound present CNS: Alert, awake, oriented x3 Psychiatry: Mood appropriate Extremities: No pedal edema, no calf tenderness  Follow ups:   Discharge Instructions    Diet - low sodium heart healthy   Complete by: As directed    Increase activity slowly   Complete by: As directed       Follow-up Information    Charlott Rakes, MD Follow up.   Specialty: Family Medicine Contact information: [place] [address] [phone]               Recommendations for Outpatient Follow-Up:   1. Follow-up with PCP/GI as an outpatient  Discharge Instructions:  Follow with Primary MD Charlott Rakes, MD in 7 days   Get CBC/BMP checked in next visit within 1 week by PCP or SNF MD ( we routinely change or add medications that can affect your baseline labs and fluid status, therefore we recommend that you get the mentioned basic workup next visit with your PCP, your PCP may decide not to get them or add new tests based on their clinical  decision)  On your next visit with your PCP, please Get Medicines reviewed and adjusted.  Please request your PCP  to go over all Hospital Tests and Procedure/Radiological results at the follow up, please get all Hospital records sent to your Prim MD by signing hospital release before you go home.  Activity: As tolerated with Full fall precautions use walker/cane & assistance as needed  For Heart failure patients - Check your Weight same time everyday, if you gain over 2 pounds, or you develop in leg swelling, experience more shortness of breath or chest pain, call your Primary MD immediately. Follow Cardiac Low Salt Diet and 1.5 lit/day fluid restriction.  If you have smoked or chewed Tobacco in the last 2 yrs please stop smoking, stop any regular Alcohol  and or any Recreational drug use.  If you experience worsening of your  admission symptoms, develop shortness of breath, life threatening emergency, suicidal or homicidal thoughts you must seek medical attention immediately by calling 911 or calling your MD immediately  if symptoms less severe.  You Must read complete instructions/literature along with all the possible adverse reactions/side effects for all the Medicines you take and that have been prescribed to you. Take any new Medicines after you have completely understood and accpet all the possible adverse reactions/side effects.   Do not drive, operate heavy machinery, perform activities at heights, swimming or participation in water activities or provide baby sitting services if your were admitted for syncope or siezures until you have seen by Primary MD or a Neurologist and advised to do so again.  Do not drive when taking Pain medications.  Do not take more than prescribed Pain, Sleep and Anxiety Medications  Wear Seat belts while driving.   Please note You were cared for by a hospitalist during your hospital stay. If you have any questions about your discharge medications or the  care you received while you were in the hospital after you are discharged, you can call the unit and asked to speak with the hospitalist on call if the hospitalist that took care of you is not available. Once you are discharged, your primary care physician will handle any further medical issues. Please note that NO REFILLS for any discharge medications will be authorized once you are discharged, as it is imperative that you return to your primary care physician (or establish a relationship with a primary care physician if you do not have one) for your aftercare needs so that they can reassess your need for medications and monitor your lab values.    Allergies as of 07/10/2020      Reactions   Iohexol Other (See Comments)   Unknown reaction at 51 days old      Medication List    STOP taking these medications   naltrexone 50 MG tablet Commonly known as: DEPADE     TAKE these medications   levETIRAcetam 500 MG tablet Commonly known as: Keppra Take 1 tablet (500 mg total) by mouth 2 (two) times daily.   pantoprazole 40 MG tablet Commonly known as: PROTONIX Take 1 tablet (40 mg total) by mouth daily.   phenytoin 100 MG ER capsule Commonly known as: Dilantin Take 3 capsules (300 mg total) by mouth daily.   vitamin k 100 MCG tablet Take 1 tablet (100 mcg total) by mouth daily. For 60 days per patient       Time coordinating discharge: 35 minutes  The results of significant diagnostics from this hospitalization (including imaging, microbiology, ancillary and laboratory) are listed below for reference.    Procedures and Diagnostic Studies:   US Abdomen Limited  Result Date: 07/09/2020 CLINICAL DATA:  50 year old male positive COVID-19. Cirrhosis. History of pancreatitis this summer. EXAM: ULTRASOUND ABDOMEN LIMITED RIGHT UPPER QUADRANT COMPARISON:  CT Abdomen and Pelvis 11/23/2019. Ultrasound 11/23/2019. FINDINGS: Gallbladder: Thickened gallbladder wall up to 7 mm (4 mm in May) with  possible trace pericholecystic fluid (image 5). However, decreased sludge within the gallbladder lumen since May. No shadowing echogenic stones. No sonographic Murphy sign elicited. Common bile duct: Diameter: 4 mm, normal. Liver: Similar increased liver echogenicity. Coarse hepatic echotexture. No discrete liver lesion. Nodular liver contour evident on images 49 through 52. Portal vein is patent on color Doppler imaging with normal direction of blood flow towards the liver. Other: Negative visible right kidney. No free fluid identified. IMPRESSION: 1.  Increased gallbladder wall thickening/edema since May, but absent gallstones and sonographic Murphy sign. Burtis Junes this is sequelae of cirrhosis rather than a calculus cholecystitis. If necessary Nuclear Medicine Hepatobiliary Scan could evaluate further. 2. Stable ultrasound appearance of cirrhotic liver with no discrete lesion. Electronically Signed   By: Genevie Ann M.D.   On: 07/09/2020 06:22   DG CHEST PORT 1 VIEW  Result Date: 07/09/2020 CLINICAL DATA:  Seizure today, smoker EXAM: PORTABLE CHEST 1 VIEW COMPARISON:  11/23/2019 chest radiograph. FINDINGS: Low lung volumes. Borderline mild enlargement of the cardiopericardial silhouette. Normal mediastinal contour. No pneumothorax. No pleural effusion. No pulmonary edema. Small vague nodular opacity overlying the peripheral right hemidiaphragm at the right lung base. IMPRESSION: Low lung volumes. Borderline mild enlargement of the cardiopericardial silhouette without pulmonary edema. Small vague nodular opacity overlying the peripheral right lung base, cannot exclude a pulmonary nodule versus nipple shadow. Suggest follow-up PA and lateral chest radiographs with nipple markers. Electronically Signed   By: Ilona Sorrel M.D.   On: 07/09/2020 06:08     Labs:   Basic Metabolic Panel: Recent Labs  Lab 07/08/20 0005 07/09/20 0005 07/09/20 1240  NA 130*  --  135  K 3.6  --  3.6  CL 99  --  104  CO2 17*  --  20*   GLUCOSE 146*  --  100*  BUN <5*  --  <5*  CREATININE 0.37*  --  0.37*  CALCIUM 7.5*  --  7.9*  MG  --  1.2* 1.8  PHOS  --   --  1.8*   GFR Estimated Creatinine Clearance: 103 mL/min (A) (by C-G formula based on SCr of 0.37 mg/dL (L)). Liver Function Tests: Recent Labs  Lab 07/08/20 0005 07/09/20 1240  AST 97* 89*  ALT 16 17  ALKPHOS 204* 213*  BILITOT 9.6* 12.5*  PROT 7.4 7.7  ALBUMIN 2.7* 2.8*   Recent Labs  Lab 07/09/20 0005  LIPASE 12   No results for input(s): AMMONIA in the last 168 hours. Coagulation profile Recent Labs  Lab 07/09/20 0206  INR 1.7*    CBC: Recent Labs  Lab 07/08/20 0005 07/09/20 0053 07/09/20 1240  WBC 7.0  --  8.1  NEUTROABS 5.4  --   --   HGB 6.7* 6.9* 9.5*  HCT 20.4* 20.8* 28.7*  MCV 72.9*  --  76.7*  PLT 75*  --  89*   Cardiac Enzymes: No results for input(s): CKTOTAL, CKMB, CKMBINDEX, TROPONINI in the last 168 hours. BNP: Invalid input(s): POCBNP CBG: Recent Labs  Lab 07/08/20 2317  GLUCAP 158*   D-Dimer Recent Labs    07/09/20 1240  DDIMER 0.51*   Hgb A1c No results for input(s): HGBA1C in the last 72 hours. Lipid Profile Recent Labs    07/09/20 1240  TRIG 120   Thyroid function studies No results for input(s): TSH, T4TOTAL, T3FREE, THYROIDAB in the last 72 hours.  Invalid input(s): FREET3 Anemia work up Recent Labs    07/09/20 1240  VITAMINB12 787  FOLATE 6.5  FERRITIN 25   Microbiology Recent Results (from the past 240 hour(s))  Resp Panel by RT-PCR (Flu A&B, Covid) Nasopharyngeal Swab     Status: Abnormal   Collection Time: 07/09/20  2:06 AM   Specimen: Nasopharyngeal Swab; Nasopharyngeal(NP) swabs in vial transport medium  Result Value Ref Range Status   SARS Coronavirus 2 by RT PCR POSITIVE (A) NEGATIVE Final    Comment: CRITICAL RESULT CALLED TO, READ BACK BY AND VERIFIED WITH:  Burnis Medin RN 07/09/2020 _0  BY P.HENDERSON (NOTE) SARS-CoV-2 target nucleic acids are DETECTED.  The  SARS-CoV-2 RNA is generally detectable in upper respiratory specimens during the acute phase of infection. Positive results are indicative of the presence of the identified virus, but do not rule out bacterial infection or co-infection with other pathogens not detected by the test. Clinical correlation with patient history and other diagnostic information is necessary to determine patient infection status. The expected result is Negative.  Fact Sheet for Patients: EntrepreneurPulse.com.au  Fact Sheet for Healthcare Providers: IncredibleEmployment.be  This test is not yet approved or cleared by the [place] FDA and  has been authorized for detection and/or diagnosis of SARS-CoV-2 by FDA under an Emergency Use Authorization (EUA).  This EUA will remain in effect ( meaning this test can be used) for the duration of  the COVID-19 declaration under Section 564(b)(1) of the Act, 21 U.S.C. section 360bbb-3(b)(1), unless the authorization is terminated or revoked sooner.     Influenza A by PCR NEGATIVE NEGATIVE Final   Influenza B by PCR NEGATIVE NEGATIVE Final    Comment: (NOTE) The Xpert Xpress SARS-CoV-2/FLU/RSV plus assay is intended as an aid in the diagnosis of influenza from Nasopharyngeal swab specimens and should not be used as a sole basis for treatment. Nasal washings and aspirates are unacceptable for Xpert Xpress SARS-CoV-2/FLU/RSV testing.  Fact Sheet for Patients: EntrepreneurPulse.com.au  Fact Sheet for Healthcare Providers: IncredibleEmployment.be  This test is not yet approved or cleared by the [place] FDA and has been authorized for detection and/or diagnosis of SARS-CoV-2 by FDA under an Emergency Use Authorization (EUA). This EUA will remain in effect (meaning this test can be used) for the duration of the COVID-19 declaration under Section 564(b)(1) of the Act, 21 U.S.C. section  360bbb-3(b)(1), unless the authorization is terminated or revoked.  Performed at [facility], [place] [address], [place], [place] [address]      Signed: Marlowe Aschoff Sherece Gambrill  [facility] 07/10/2020, 2:24 PM

## 2020-07-10 NOTE — ED Provider Notes (Signed)
Patient given Haldol 5 mg IV for continued agitation and noncooperation.  Patient still uncontrollable.  Will order soft wrist and ankle restraints.  Patient in no respiratory or physiologic distress but confused and uncooperative.     Joey Hudock, Jonny Ruiz, MD 07/10/20 [phone]

## 2020-07-10 NOTE — ED Notes (Signed)
Hospitalist on call contacted of changes in patient's status and aggressive behavior.

## 2020-07-10 NOTE — Progress Notes (Addendum)
Progress Note   Subjective  Chief Complaint: Anemia, cirrhosis  This morning patient is found sleeping and does not arouse well.  Per nursing he has been sedated due to fighting overnight.  They tell me that he has had no further signs of bleeding.   Objective   Vital signs in last 24 hours: Temp:  [98.5 F (36.9 C)-99.2 F (37.3 C)] 98.7 F (37.1 C) (01/03 0159) Pulse Rate:  [71-133] 91 (01/03 1000) Resp:  [13-27] 23 (01/03 1000) BP: (109-165)/(73-107) 111/73 (01/03 1000) SpO2:  [92 %-100 %] 95 % (01/03 1000)   General:   AA male in NAD Heart:  Regular rate and rhythm; no murmurs Lungs: Respirations even and unlabored, lungs CTA bilaterally Abdomen:  Soft, nontender and nondistended. Normal bowel sounds. Extremities:  Without edema. Psych: Sedated  Intake/Output from previous day: 01/02 0701 - 01/03 0700 In: 1249.2 [P.O.:400; I.V.:150; Blood:433.3; IV Piggyback:265.8] Out: 1050 [Urine:1050]  Lab Results: Recent Labs    07/08/20 0005 07/09/20 0053 07/09/20 1240  WBC 7.0  --  8.1  HGB 6.7* 6.9* 9.5*  HCT 20.4* 20.8* 28.7*  PLT 75*  --  89*   BMET Recent Labs    07/08/20 0005 07/09/20 1240  NA 130* 135  K 3.6 3.6  CL 99 104  CO2 17* 20*  GLUCOSE 146* 100*  BUN <5* <5*  CREATININE 0.37* 0.37*  CALCIUM 7.5* 7.9*   LFT Recent Labs    07/09/20 1240  PROT 7.7  ALBUMIN 2.8*  AST 89*  ALT 17  ALKPHOS 213*  BILITOT 12.5*   PT/INR Recent Labs    07/09/20 0206  LABPROT 19.3*  INR 1.7*    Studies/Results: US Abdomen Limited  Result Date: 07/09/2020 CLINICAL DATA:  50 year old male positive COVID-19. Cirrhosis. History of pancreatitis this summer. EXAM: ULTRASOUND ABDOMEN LIMITED RIGHT UPPER QUADRANT COMPARISON:  CT Abdomen and Pelvis 11/23/2019. Ultrasound 11/23/2019. FINDINGS: Gallbladder: Thickened gallbladder wall up to 7 mm (4 mm in May) with possible trace pericholecystic fluid (image 5). However, decreased sludge within the gallbladder lumen  since May. No shadowing echogenic stones. No sonographic Murphy sign elicited. Common bile duct: Diameter: 4 mm, normal. Liver: Similar increased liver echogenicity. Coarse hepatic echotexture. No discrete liver lesion. Nodular liver contour evident on images 49 through 52. Portal vein is patent on color Doppler imaging with normal direction of blood flow towards the liver. Other: Negative visible right kidney. No free fluid identified. IMPRESSION: 1. Increased gallbladder wall thickening/edema since May, but absent gallstones and sonographic Murphy sign. Legrand Rams this is sequelae of cirrhosis rather than a calculus cholecystitis. If necessary Nuclear Medicine Hepatobiliary Scan could evaluate further. 2. Stable ultrasound appearance of cirrhotic liver with no discrete lesion. Electronically Signed   By: Odessa Fleming M.D.   On: 07/09/2020 06:22   DG CHEST PORT 1 VIEW  Result Date: 07/09/2020 CLINICAL DATA:  Seizure today, smoker EXAM: PORTABLE CHEST 1 VIEW COMPARISON:  11/23/2019 chest radiograph. FINDINGS: Low lung volumes. Borderline mild enlargement of the cardiopericardial silhouette. Normal mediastinal contour. No pneumothorax. No pleural effusion. No pulmonary edema. Small vague nodular opacity overlying the peripheral right hemidiaphragm at the right lung base. IMPRESSION: Low lung volumes. Borderline mild enlargement of the cardiopericardial silhouette without pulmonary edema. Small vague nodular opacity overlying the peripheral right lung base, cannot exclude a pulmonary nodule versus nipple shadow. Suggest follow-up PA and lateral chest radiographs with nipple markers. Electronically Signed   By: Delbert Phenix M.D.   On: 07/09/2020 06:08  Assessment / Plan:   Assessment: 1.  Heme positive stools and microcytic anemia: Probably iron deficient with a ferritin of 25, history of alcoholic cirrhosis?  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  Alcoholic cirrhosis: Meld NA 24, presumed alcoholic origin,  continues to drink, suspect decompensation of liver disease 3.  Covid positive: Asymptomatic 4.  History of alcohol-related seizures: Admitted with seizure activity yesterday but also had not been taking meds at home  Plan: 1.  Continue IV Protonix 2.  Continue to trend hemoglobin, transfuse if less than 7 3.  Vitamin K to correct coagulopathy 4.  Trend LFTs, PT 5.  Watch for alcohol withdrawal 6.  Once patient is out of isolation for COVID-19 recommend a CT the abdomen pelvis with p.o. and IV contrast followed by EGD/colonoscopy as inpatient/outpatient depending upon clinical course 6.  Please await further recommendations from Dr. Fuller Plan later today  Thank you for kind consultation, we will continue to follow along.   LOS: 1 day   Levin Erp  07/10/2020, 10:30 AM       Attending Physician Note   I have taken an interval history, reviewed the chart and examined the patient. I agree with the Advanced Practitioner's note, impression and recommendations.  Dr. Pietro Cassis messaged me about discharging patient. OK for GI work up with CT AP, colonoscopy, EGD as outpatient after recovery from Covid and after his seizure disorder is under good control. Strictly avoid all etoh use. Avoid Tylenol until cleared by Dr. Ardis Hughs. Iron replacement. Continue PPI qd. Close outpatient follow up with Dr. Margarita Rana, his PCP, and Dr. Ardis Hughs, his GI physician.   Lucio Edward, MD [facility]

## 2020-07-10 NOTE — ED Notes (Signed)
Patient continues to get up despite instruction to stay in bed. Patient is verbally aggressive, but noted to be confused, but redirectable. Bed alarm continues to stay in place. Mid-level paged, no new orders. Patient continues to get up and pull at cords and lines. ED MD made aware, order for medication for patient safety. Patient changed into a gown due to soiling his pants. Provided with new blankets.

## 2020-07-12 LAB — COPPER, SERUM: Copper: 120 ug/dL (ref 69–132)

## 2020-07-17 ENCOUNTER — Ambulatory Visit: Payer: Self-pay | Admitting: Family Medicine

## 2020-09-17 ENCOUNTER — Inpatient Hospital Stay (HOSPITAL_COMMUNITY)
Admission: EM | Admit: 2020-09-17 | Discharge: 2020-09-21 | DRG: 100 | Disposition: A | Discharge status: Home / Self Care | Payer: Self-pay | Attending: Internal Medicine | Admitting: Internal Medicine

## 2020-09-17 ENCOUNTER — Emergency Department (HOSPITAL_COMMUNITY): Payer: 59

## 2020-09-17 ENCOUNTER — Other Ambulatory Visit: Payer: Self-pay

## 2020-09-17 ENCOUNTER — Encounter (HOSPITAL_COMMUNITY): Payer: Self-pay | Admitting: Emergency Medicine

## 2020-09-17 DIAGNOSIS — K7031 Alcoholic cirrhosis of liver with ascites: Secondary | ICD-10-CM | POA: Diagnosis present

## 2020-09-17 DIAGNOSIS — Z91041 Radiographic dye allergy status: Secondary | ICD-10-CM

## 2020-09-17 DIAGNOSIS — K7030 Alcoholic cirrhosis of liver without ascites: Secondary | ICD-10-CM | POA: Diagnosis present

## 2020-09-17 DIAGNOSIS — Z905 Acquired absence of kidney: Secondary | ICD-10-CM

## 2020-09-17 DIAGNOSIS — D638 Anemia in other chronic diseases classified elsewhere: Secondary | ICD-10-CM | POA: Diagnosis present

## 2020-09-17 DIAGNOSIS — F1010 Alcohol abuse, uncomplicated: Secondary | ICD-10-CM | POA: Diagnosis not present

## 2020-09-17 DIAGNOSIS — K3189 Other diseases of stomach and duodenum: Secondary | ICD-10-CM | POA: Diagnosis present

## 2020-09-17 DIAGNOSIS — I8510 Secondary esophageal varices without bleeding: Secondary | ICD-10-CM | POA: Diagnosis not present

## 2020-09-17 DIAGNOSIS — D62 Acute posthemorrhagic anemia: Secondary | ICD-10-CM

## 2020-09-17 DIAGNOSIS — E1165 Type 2 diabetes mellitus with hyperglycemia: Secondary | ICD-10-CM | POA: Diagnosis present

## 2020-09-17 DIAGNOSIS — R4182 Altered mental status, unspecified: Secondary | ICD-10-CM

## 2020-09-17 DIAGNOSIS — D689 Coagulation defect, unspecified: Secondary | ICD-10-CM | POA: Diagnosis present

## 2020-09-17 DIAGNOSIS — K709 Alcoholic liver disease, unspecified: Secondary | ICD-10-CM

## 2020-09-17 DIAGNOSIS — I639 Cerebral infarction, unspecified: Secondary | ICD-10-CM | POA: Diagnosis present

## 2020-09-17 DIAGNOSIS — R569 Unspecified convulsions: Secondary | ICD-10-CM

## 2020-09-17 DIAGNOSIS — F17210 Nicotine dependence, cigarettes, uncomplicated: Secondary | ICD-10-CM | POA: Diagnosis present

## 2020-09-17 DIAGNOSIS — K7040 Alcoholic hepatic failure without coma: Secondary | ICD-10-CM | POA: Diagnosis present

## 2020-09-17 DIAGNOSIS — E871 Hypo-osmolality and hyponatremia: Secondary | ICD-10-CM | POA: Diagnosis present

## 2020-09-17 DIAGNOSIS — K7200 Acute and subacute hepatic failure without coma: Secondary | ICD-10-CM

## 2020-09-17 DIAGNOSIS — E7220 Disorder of urea cycle metabolism, unspecified: Secondary | ICD-10-CM

## 2020-09-17 DIAGNOSIS — G40909 Epilepsy, unspecified, not intractable, without status epilepticus: Principal | ICD-10-CM

## 2020-09-17 DIAGNOSIS — K219 Gastro-esophageal reflux disease without esophagitis: Secondary | ICD-10-CM | POA: Diagnosis present

## 2020-09-17 DIAGNOSIS — Z8616 Personal history of COVID-19: Secondary | ICD-10-CM | POA: Diagnosis not present

## 2020-09-17 DIAGNOSIS — Z833 Family history of diabetes mellitus: Secondary | ICD-10-CM

## 2020-09-17 DIAGNOSIS — Z9119 Patient's noncompliance with other medical treatment and regimen: Secondary | ICD-10-CM

## 2020-09-17 DIAGNOSIS — K921 Melena: Secondary | ICD-10-CM | POA: Diagnosis present

## 2020-09-17 DIAGNOSIS — G9341 Metabolic encephalopathy: Secondary | ICD-10-CM | POA: Diagnosis present

## 2020-09-17 DIAGNOSIS — E876 Hypokalemia: Secondary | ICD-10-CM

## 2020-09-17 DIAGNOSIS — D696 Thrombocytopenia, unspecified: Secondary | ICD-10-CM | POA: Diagnosis not present

## 2020-09-17 DIAGNOSIS — I8500 Esophageal varices without bleeding: Secondary | ICD-10-CM

## 2020-09-17 DIAGNOSIS — K766 Portal hypertension: Secondary | ICD-10-CM | POA: Diagnosis present

## 2020-09-17 DIAGNOSIS — K7682 Hepatic encephalopathy: Secondary | ICD-10-CM | POA: Diagnosis present

## 2020-09-17 DIAGNOSIS — K7460 Unspecified cirrhosis of liver: Secondary | ICD-10-CM

## 2020-09-17 DIAGNOSIS — Z79899 Other long term (current) drug therapy: Secondary | ICD-10-CM

## 2020-09-17 DIAGNOSIS — U071 COVID-19: Secondary | ICD-10-CM | POA: Diagnosis present

## 2020-09-17 HISTORY — DX: Coagulation defect, unspecified: D68.9

## 2020-09-17 HISTORY — DX: Thrombocytopenia, unspecified: D69.6

## 2020-09-17 LAB — I-STAT CHEM 8, ED
BUN: 6 mg/dL (ref 6–20)
Calcium, Ion: 1.05 mmol/L — ABNORMAL LOW (ref 1.15–1.40)
Chloride: 88 mmol/L — ABNORMAL LOW (ref 98–111)
Creatinine, Ser: 0.4 mg/dL — ABNORMAL LOW (ref 0.61–1.24)
Glucose, Bld: 257 mg/dL — ABNORMAL HIGH (ref 70–99)
HCT: 27 % — ABNORMAL LOW (ref 39.0–52.0)
Hemoglobin: 9.2 g/dL — ABNORMAL LOW (ref 13.0–17.0)
Potassium: 2.7 mmol/L — CL (ref 3.5–5.1)
Sodium: 127 mmol/L — ABNORMAL LOW (ref 135–145)
TCO2: 15 mmol/L — ABNORMAL LOW (ref 22–32)

## 2020-09-17 LAB — COMPREHENSIVE METABOLIC PANEL
ALT: 24 U/L (ref 0–44)
AST: 107 U/L — ABNORMAL HIGH (ref 15–41)
Albumin: 3 g/dL — ABNORMAL LOW (ref 3.5–5.0)
Alkaline Phosphatase: 162 U/L — ABNORMAL HIGH (ref 38–126)
Anion gap: 28 — ABNORMAL HIGH (ref 5–15)
BUN: 6 mg/dL (ref 6–20)
CO2: 11 mmol/L — ABNORMAL LOW (ref 22–32)
Calcium: 8.5 mg/dL — ABNORMAL LOW (ref 8.9–10.3)
Chloride: 86 mmol/L — ABNORMAL LOW (ref 98–111)
Creatinine, Ser: 0.79 mg/dL (ref 0.61–1.24)
GFR, Estimated: >60 mL/min (ref >60)
Glucose, Bld: 254 mg/dL — ABNORMAL HIGH (ref 70–99)
Potassium: 2.7 mmol/L — CL (ref 3.5–5.1)
Sodium: 125 mmol/L — ABNORMAL LOW (ref 135–145)
Total Bilirubin: 6.8 mg/dL — ABNORMAL HIGH (ref 0.3–1.2)
Total Protein: 7.7 g/dL (ref 6.5–8.1)

## 2020-09-17 LAB — CBC WITH DIFFERENTIAL/PLATELET
Abs Immature Granulocytes: 0.09 10*3/uL — ABNORMAL HIGH (ref 0.00–0.07)
Basophils Absolute: 0.1 10*3/uL (ref 0.0–0.1)
Basophils Relative: 1 %
Eosinophils Absolute: 0 10*3/uL (ref 0.0–0.5)
Eosinophils Relative: 0 %
HCT: 23.8 % — ABNORMAL LOW (ref 39.0–52.0)
Hemoglobin: 7.6 g/dL — ABNORMAL LOW (ref 13.0–17.0)
Immature Granulocytes: 1 %
Lymphocytes Relative: 17 %
Lymphs Abs: 1.3 10*3/uL (ref 0.7–4.0)
MCH: 26.9 pg (ref 26.0–34.0)
MCHC: 31.9 g/dL (ref 30.0–36.0)
MCV: 84.1 fL (ref 80.0–100.0)
Monocytes Absolute: 1 10*3/uL (ref 0.1–1.0)
Monocytes Relative: 13 %
Neutro Abs: 4.9 10*3/uL (ref 1.7–7.7)
Neutrophils Relative %: 68 %
Platelets: 69 10*3/uL — ABNORMAL LOW (ref 150–400)
RBC: 2.83 MIL/uL — ABNORMAL LOW (ref 4.22–5.81)
RDW: 20.6 % — ABNORMAL HIGH (ref 11.5–15.5)
WBC: 7.3 10*3/uL (ref 4.0–10.5)
nRBC: 0 % (ref 0.0–0.2)

## 2020-09-17 LAB — CBG MONITORING, ED: Glucose-Capillary: 260 mg/dL — ABNORMAL HIGH (ref 70–99)

## 2020-09-17 LAB — URINALYSIS, ROUTINE W REFLEX MICROSCOPIC
Bacteria, UA: NONE SEEN
Glucose, UA: 150 mg/dL — AB
Hgb urine dipstick: NEGATIVE
Ketones, ur: 80 mg/dL — AB
Leukocytes,Ua: NEGATIVE
Nitrite: NEGATIVE
Protein, ur: 30 mg/dL — AB
Specific Gravity, Urine: 1.018 (ref 1.005–1.030)
pH: 6 (ref 5.0–8.0)

## 2020-09-17 LAB — RAPID URINE DRUG SCREEN, HOSP PERFORMED
Amphetamines: NOT DETECTED
Barbiturates: NOT DETECTED
Benzodiazepines: POSITIVE — AB
Cocaine: NOT DETECTED
Opiates: NOT DETECTED
Tetrahydrocannabinol: NOT DETECTED

## 2020-09-17 LAB — AMMONIA: Ammonia: 256 umol/L — ABNORMAL HIGH (ref 9–35)

## 2020-09-17 LAB — PROTIME-INR
INR: 1.5 — ABNORMAL HIGH (ref 0.8–1.2)
Prothrombin Time: 17.5 seconds — ABNORMAL HIGH (ref 11.4–15.2)

## 2020-09-17 LAB — LIPASE, BLOOD: Lipase: 22 U/L (ref 11–51)

## 2020-09-17 LAB — MAGNESIUM: Magnesium: 1.5 mg/dL — ABNORMAL LOW (ref 1.7–2.4)

## 2020-09-17 LAB — PHENYTOIN LEVEL, TOTAL: Phenytoin Lvl: <2.5 ug/mL — ABNORMAL LOW (ref 10.0–20.0)

## 2020-09-17 MED ORDER — SODIUM CHLORIDE 0.9 % IV BOLUS
1000.0000 mL | Freq: Once | INTRAVENOUS | Status: AC
  Administered 2020-09-17: 1000 mL via INTRAVENOUS

## 2020-09-17 MED ORDER — LORAZEPAM 2 MG/ML IJ SOLN
2.0000 mg | INTRAMUSCULAR | Status: DC | PRN
  Administered 2020-09-17 (×2): 2 mg via INTRAVENOUS
  Filled 2020-09-17 (×2): qty 1

## 2020-09-17 MED ORDER — LORAZEPAM 2 MG/ML IJ SOLN
2.0000 mg | Freq: Once | INTRAMUSCULAR | Status: AC
  Administered 2020-09-17: 2 mg via INTRAVENOUS
  Filled 2020-09-17: qty 1

## 2020-09-17 MED ORDER — MAGNESIUM SULFATE 2 GM/50ML IV SOLN
2.0000 g | INTRAVENOUS | Status: AC
  Administered 2020-09-17: 2 g via INTRAVENOUS
  Filled 2020-09-17: qty 50

## 2020-09-17 MED ORDER — POTASSIUM CHLORIDE 10 MEQ/100ML IV SOLN
10.0000 meq | INTRAVENOUS | Status: AC
Start: 2020-09-17 — End: 2020-09-17
  Administered 2020-09-17 (×3): 10 meq via INTRAVENOUS
  Filled 2020-09-17 (×7): qty 100

## 2020-09-17 MED ORDER — LACTULOSE ENEMA
300.0000 mL | Freq: Two times a day (BID) | ORAL | Status: DC
  Administered 2020-09-17: 300 mL via RECTAL
  Filled 2020-09-17 (×2): qty 300

## 2020-09-17 MED ORDER — SODIUM CHLORIDE 0.9 % IV SOLN
2000.0000 mg | Freq: Once | INTRAVENOUS | Status: AC
  Administered 2020-09-17: 2000 mg via INTRAVENOUS
  Filled 2020-09-17: qty 20

## 2020-09-17 MED ORDER — THIAMINE HCL 100 MG/ML IJ SOLN
100.0000 mg | Freq: Once | INTRAMUSCULAR | Status: AC
  Administered 2020-09-17: 100 mg via INTRAVENOUS
  Filled 2020-09-17: qty 2

## 2020-09-17 NOTE — ED Notes (Signed)
Per EDP, hold lactulose until after MRI.

## 2020-09-17 NOTE — Progress Notes (Signed)
Attempted pt 2xs. First time he was trying to sit up in the bed and moving around.  Called RN, sent pt back. RN medicated the pt and sent him back. When pt arrived, he was asleep on the stretcher. Pt again, tried to crawl out of the machine, was turning himself over inside the tunnel, and grabbing at the headcoil. He would not hold still until he was back on the transport str where he proceeded to go back to sleep.

## 2020-09-17 NOTE — H&P (Signed)
History and Physical   Gary Mitchell:[id number] DOB: 11-Dec-1970 DOA: 09/17/2020  Referring MD/NP/PA: Dr. Kathrynn Humble  PCP: Charlott Rakes, MD   Outpatient Specialists: None  Patient coming from: Home  Chief Complaint: Multiple seizures  HPI: Gary Mitchell is a 50 y.o. male with medical history significant of alcohol abuse, alcoholic liver disease, seizure disorder on Keppra, recent COVID-19 pneumonia in January, GERD, anemia of chronic disease who drinks excessively apparently staying with his mother.  Mother is not aware or other nausea of the MRI but sees a lot of empty bottles.  She was at work today when patient had multiple seizures.  Apparently had 4 seizures prior to arrival of EMS.  He then had witnessed seizures x3 by EMS.  He is currently obtunded not able to communicate.  He was supposed to be on antiseizure medications but levels were checked and nothing was in his system.  Mother said he has been having vomiting for the last several days and has not taken his medications.  Patient was also found to have ammonia level of more than 400.  His drug screen is only positive for benzodiazepine.  Alcohol level currently pending but has been less than 10 previously.  Head CT showed possible acute CVA.  Subsequent MRI attempted but patient unable to go through with it due to agitation.  He has been admitted therefore to the hospital for further evaluation and treatment.  ED Course: Temperature 97.7 blood pressure 142/86 pulse 190 respirate 25 oxygen sat is currently 100% on 2 L White count 7.3 hemoglobin 9.2 platelets 69 sodium 127 potassium 2.0 chloride 88 CO2 11 BUN 6 creatinine 0.41 calcium 8.5.  Chest x-ray showed no acute findings.  Head CT without contrast showed decreased attenuation in the right frontal lobe concerning for recent and potential acute infarct.  Patient being admitted to progressive care for further treatment.  Review of Systems: As per HPI otherwise 10 point review of  systems negative.    Past Medical History:  Diagnosis Date  . 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 Seizures ([place])     Past Surgical History:  Procedure Laterality Date  . left kidney removed       reports that he has been smoking. He has a 50.00 pack-year smoking history. He has never used smokeless tobacco. He reports previous alcohol use of about 3.0 standard drinks of alcohol per week. He reports previous drug use. Drug: Marijuana.  Allergies  Allergen Reactions  . Iohexol Other (See Comments)    Unknown reaction at 75 days old    Family History  Problem Relation Age of Onset  . Diabetes Mellitus II Mother   . Colon cancer Neg Hx   . Esophageal cancer Neg Hx      Prior to Admission medications   Medication Sig Start Date End Date Taking? Authorizing Provider  levETIRAcetam (KEPPRA) 500 MG tablet Take 1 tablet (500 mg total) by mouth 2 (two) times daily. 07/10/20 10/08/20 Yes Dahal, Marlowe Aschoff, MD  pantoprazole (PROTONIX) 40 MG tablet Take 1 tablet (40 mg total) by mouth daily. 07/10/20 10/08/20 Yes Dahal, Marlowe Aschoff, MD  phenytoin (DILANTIN) 100 MG ER capsule Take 3 capsules (300 mg total) by mouth daily. 07/10/20 10/08/20 Yes Dahal, Marlowe Aschoff, MD  vitamin k 100 MCG tablet Take 1 tablet (100 mcg total) by mouth daily. For 60 days per patient 07/10/20 10/08/20 Yes Dahal, Marlowe Aschoff, MD    Physical Exam: Vitals:  09/17/20 2130 09/17/20 2200 09/17/20 2215 09/17/20 2245  BP: 121/78 (!) 127/93 133/89 105/72  Pulse: 98 (!) 115 (!) 103 (!) 103  Resp: (!) 24 (!) 22 20 (!) 23  Temp:      TempSrc:      SpO2: 100% 100% 100% 100%      Constitutional: Obtunded, not arousable Vitals:   09/17/20 2130 09/17/20 2200 09/17/20 2215 09/17/20 2245  BP: 121/78 (!) 127/93 133/89 105/72  Pulse: 98 (!) 115 (!) 103 (!) 103  Resp: (!) 24 (!) 22 20 (!) 23  Temp:      TempSrc:      SpO2: 100% 100% 100% 100%   Eyes: PERRL, lids and conjunctivae jaundiced ENMT: Mucous membranes are dry.  Posterior pharynx clear of any exudate or lesions.Normal dentition.  Neck: normal, supple, no masses, no thyromegaly Respiratory: clear to auscultation bilaterally, no wheezing, no crackles. Normal respiratory effort. No accessory muscle use.  Cardiovascular: Sinus tachycardia no murmurs / rubs / gallops. No extremity edema. 2+ pedal pulses. No carotid bruits.  Abdomen: no tenderness, no masses palpated. No hepatosplenomegaly. Bowel sounds positive.  Musculoskeletal: no clubbing / cyanosis. No joint deformity upper and lower extremities. Good ROM, no contractures. Normal muscle tone.  Skin: no rashes, lesions, ulcers. No induration Neurologic: CN 2-12 grossly intact. Sensation intact, DTR normal. Strength 5/5 in all 4.  Psychiatric: Obtunded, unresponsive to voice    Labs on Admission: I have personally reviewed following labs and imaging studies  CBC: Recent Labs  Lab 09/17/20 1545 09/17/20 1606  WBC 7.3  --   NEUTROABS 4.9  --   HGB 7.6* 9.2*  HCT 23.8* 27.0*  MCV 84.1  --   PLT 69*  --    Basic Metabolic Panel: Recent Labs  Lab 09/17/20 1545 09/17/20 1606  NA 125* 127*  K 2.7* 2.7*  CL 86* 88*  CO2 11*  --   GLUCOSE 254* 257*  BUN 6 6  CREATININE 0.79 0.40*  CALCIUM 8.5*  --   MG 1.5*  --    GFR: CrCl cannot be calculated (Unknown ideal weight.). Liver Function Tests: Recent Labs  Lab 09/17/20 1545  AST 107*  ALT 24  ALKPHOS 162*  BILITOT 6.8*  PROT 7.7  ALBUMIN 3.0*   Recent Labs  Lab 09/17/20 1713  LIPASE 22   Recent Labs  Lab 09/17/20 1545  AMMONIA 256*   Coagulation Profile: Recent Labs  Lab 09/17/20 1545  INR 1.5*   Cardiac Enzymes: No results for input(s): CKTOTAL, CKMB, CKMBINDEX, TROPONINI in the last 168 hours. BNP (last 3 results) No results for input(s): PROBNP in the last 8760 hours. HbA1C: No results for input(s): HGBA1C in the last 72 hours. CBG: Recent Labs  Lab 09/17/20 1540  GLUCAP 260*   Lipid Profile: No results  for input(s): CHOL, HDL, LDLCALC, TRIG, CHOLHDL, LDLDIRECT in the last 72 hours. Thyroid Function Tests: No results for input(s): TSH, T4TOTAL, FREET4, T3FREE, THYROIDAB in the last 72 hours. Anemia Panel: No results for input(s): VITAMINB12, FOLATE, FERRITIN, TIBC, IRON, RETICCTPCT in the last 72 hours. Urine analysis:    Component Value Date/Time   COLORURINE AMBER (A) 09/17/2020 1545   APPEARANCEUR CLEAR 09/17/2020 1545   LABSPEC 1.018 09/17/2020 1545   PHURINE 6.0 09/17/2020 1545   GLUCOSEU 150 (A) 09/17/2020 1545   HGBUR NEGATIVE 09/17/2020 1545   BILIRUBINUR SMALL (A) 09/17/2020 1545   KETONESUR 80 (A) 09/17/2020 1545   PROTEINUR 30 (A) 09/17/2020 1545   NITRITE  NEGATIVE 09/17/2020 1545   LEUKOCYTESUR NEGATIVE 09/17/2020 1545   Sepsis Labs: @LABRCNTIP (procalcitonin:4,lacticidven:4) )No results found for this or any previous visit (from the past 240 hour(s)).   Radiological Exams on Admission: CT HEAD WO CONTRAST  Result Date: 09/17/2020 CLINICAL DATA:  Seizure EXAM: CT HEAD WITHOUT CONTRAST TECHNIQUE: Contiguous axial images were obtained from the base of the skull through the vertex without intravenous contrast. COMPARISON:  September 30, 2014 FINDINGS: Brain: Ventricles and sulci are normal in size and configuration. There is no intracranial mass, hemorrhage, extra-axial fluid collection, or midline shift. There is decreased attenuation in the mid right frontal lobe, a finding concerning for potential acute infarct in this area. Elsewhere brain parenchyma appears unremarkable. No evident acute infarct. Vascular: No hyperdense vessel. There is slight calcification in each carotid siphon region. Skull: Bony calvarium appears intact. Sinuses/Orbits: There is a retention cyst in the superior right maxillary antrum. Paranasal sinuses are clear. Orbits appear symmetric bilaterally. Other: Mastoid air cells are clear. IMPRESSION: Decreased attenuation in the right frontal lobe midportion  concerning for recent and potentially acute infarct. Elsewhere brain parenchyma appears unremarkable. No mass or hemorrhage appreciable. Mild arterial vascular calcification noted. Retention cyst in right maxillary antrum. 2325 Electronically Signed   By: Lowella Grip III M.D.   On: 09/17/2020 16:26   DG Chest Port 1 View  Result Date: 09/17/2020 CLINICAL DATA:  Seizure. EXAM: PORTABLE CHEST 1 VIEW COMPARISON:  July 09, 2020 FINDINGS: The cardiomediastinal silhouette is stable. No focal infiltrate. No pneumothorax. Healed left rib fracture. IMPRESSION: No active disease. Electronically Signed   By: Dorise Bullion III M.D   On: 09/17/2020 16:00      Assessment/Plan Principal Problem:   Seizure ([place]) Active Problems:   Alcohol abuse   Thrombocytopenia ([place])   COVID-19 virus infection   GERD (gastroesophageal reflux disease)   Alcoholic cirrhosis of liver without ascites ([facility])   Acute hepatic encephalopathy   Hypomagnesemia   Suspected CVA (cerebral vascular accident) ([place])   Hypokalemia     #1 multiple seizures: Patient had at least 7 seizure episodes today.  He has history of seizure disorder and has not been taking his seizure medicine.  Also suspected to be due to alcohol withdrawals.  At this point neurology involved.  Admit the patient.  He has been loaded with Keppra.  We will continue the [place] IV.  Also treat for hepatic encephalopathy.  #2 alcoholic cirrhosis: No ascites.  Patient has hepatic encephalopathy.  Lactulose rectal enema given.  Once status awake and will do oral.  Continue supportive care LFTs elevated.  #3 hypokalemia: Potassium 2.7.  Aggressive replacement safely.  #4 hypomagnesemia: Most likely secondary to alcoholism.  Continue to replete magnesium  #5 GERD: Continue with PPIs  #6 recent COVID-19 infection: Patient way outside the window for infectiousness.  Having January.  Continue to monitor  #7 possible acute CVA: Head CT showed probably recent  infarct.  Patient did not tolerate MRI tonight.  Will attempt MRI again hopefully tomorrow when he is more stable.   DVT prophylaxis: SCD Code Status: Full code Family Communication: Mother at bedside Disposition Plan: To be determined Consults called: Dr. Cheral Marker, neurology Admission status: Inpatient  Severity of Illness: The appropriate patient status for this patient is INPATIENT. Inpatient status is judged to be reasonable and necessary in order to provide the required intensity of service to ensure the patient's safety. The patient's presenting symptoms, physical exam findings, and initial radiographic and laboratory data in the context  of their chronic comorbidities is felt to place them at high risk for further clinical deterioration. Furthermore, it is not anticipated that the patient will be medically stable for discharge from the hospital within 2 midnights of admission. The following factors support the patient status of inpatient.   " The patient's presenting symptoms include altered mental status and seizure. " The worrisome physical exam findings include obtundation. " The initial radiographic and laboratory data are worrisome because of ammonia level more than 400. " The chronic co-morbidities include alcoholism.   * I certify that at the point of admission it is my clinical judgment that the patient will require inpatient hospital care spanning beyond 2 midnights from the point of admission due to high intensity of service, high risk for further deterioration and high frequency of surveillance required.Barbette Merino MD [facility] Pager [phone]  If 7PM-7AM, please contact night-coverage [email or web address] Password [facility]  09/17/2020, 11:23 PM

## 2020-09-17 NOTE — ED Notes (Signed)
Pt transported back to MRI.  

## 2020-09-17 NOTE — ED Notes (Signed)
Condom cath placed on patient

## 2020-09-17 NOTE — ED Triage Notes (Addendum)
Pt BIB [facility] from home, pt with multiple witnessed seizures by family today, and one with EMS. Hx epilepsy and alcoholism, family unsure if pt has been taking his meds as prescribed. EMS states last seizure lasted 2 minutes, pt with left gaze , given a total of 10mg  versed by EMS. Pt responsive to pain at this time. EMS VS: BP 132/78, HR 116, 100% NRB.

## 2020-09-17 NOTE — ED Notes (Signed)
Per MRI, bringing pt back to room, states he is trying to sit up and has urinated on himself. EDP aware.

## 2020-09-17 NOTE — ED Notes (Signed)
Advised by MRI that transport would return for pt in approx. 10 minutes.

## 2020-09-17 NOTE — ED Notes (Signed)
Patient transported to MRI 

## 2020-09-17 NOTE — ED Provider Notes (Signed)
[place] EMERGENCY DEPARTMENT Provider Note   CSN: [id number] Arrival date & time: 09/17/20  1535     History No chief complaint on file.   Gary Mitchell is a 50 y.o. male.  Who presents to the emergency department for seizures.  The patient has a past medical history of alcoholic cirrhosis, pancreatitis, chronic alcohol abuse.  He is apparently on seizure medications but family is unsure if he has been taking his medication recently.  According to EMS he had 4 seizures prior to their arrival and then had 3 witnessed seizures with them the longest lasting for 2-1/2 minutes.  The patient was given 5 mg of IM Versed and after establishment of IV received 2 additional doses of 2.5 mg Versed and had stopped seizing prior to arrival here in the emergency department the patient is unable to provide history.  There is a level 5 caveat.  And arrives on nonrebreather.  This was placed during the seizure however he had no documented hypoxia.  HPI     Past Medical History:  Diagnosis Date  . 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 Seizures [facility])     Patient Active Problem List   Diagnosis Date Noted  . Seizures ([place]) 07/09/2020  . High bilirubin 07/09/2020  . Anemia 07/09/2020  . Thrombocytopenia ([place]) 07/09/2020  . COVID-19 virus infection 07/09/2020  . GERD (gastroesophageal reflux disease) 07/09/2020  . Pancreatic mass 11/29/2019  . Acute pancreatitis 11/23/2019  . Alcoholic hepatitis 83/15/1761  . Gallstones 11/23/2019  . Alcohol abuse 11/23/2019    Past Surgical History:  Procedure Laterality Date  . left kidney removed         Family History  Problem Relation Age of Onset  . Diabetes Mellitus II Mother   . Colon cancer Neg Hx   . Esophageal cancer Neg Hx     Social History   Tobacco Use  . Smoking status: Current Every Day Smoker    Packs/day: 2.00    Years: 25.00    Pack years: 50.00  . Smokeless tobacco: Never Used   Vaping Use  . Vaping Use: Never used  Substance Use Topics  . Alcohol use: Not Currently    Alcohol/week: 3.0 standard drinks    Types: 3 Cans of beer per week  . Drug use: Not Currently    Types: Marijuana    Home Medications Prior to Admission medications   Medication Sig Start Date End Date Taking? Authorizing Provider  levETIRAcetam (KEPPRA) 500 MG tablet Take 1 tablet (500 mg total) by mouth 2 (two) times daily. 07/10/20 10/08/20  Terrilee Croak, MD  pantoprazole (PROTONIX) 40 MG tablet Take 1 tablet (40 mg total) by mouth daily. 07/10/20 10/08/20  Terrilee Croak, MD  phenytoin (DILANTIN) 100 MG ER capsule Take 3 capsules (300 mg total) by mouth daily. 07/10/20 10/08/20  Terrilee Croak, MD  vitamin k 100 MCG tablet Take 1 tablet (100 mcg total) by mouth daily. For 60 days per patient 07/10/20 10/08/20  Terrilee Croak, MD    Allergies    Iohexol  Review of Systems   Review of Systems Ten systems reviewed and are negative for acute change, except as noted in the HPI.   Physical Exam Updated Vital Signs There were no vitals taken for this visit.  Physical Exam Vitals and nursing note reviewed.  Constitutional:      General: He is not in acute distress.    Appearance:  He is well-developed. He is not diaphoretic.  HENT:     Head: Normocephalic and atraumatic.  Eyes:     General: No scleral icterus.    Comments: Eyes deviated to the left  Icteric   Cardiovascular:     Rate and Rhythm: Normal rate and regular rhythm.     Heart sounds: Normal heart sounds.  Pulmonary:     Effort: Pulmonary effort is normal. No respiratory distress.     Breath sounds: Normal breath sounds.  Abdominal:     Palpations: Abdomen is soft.     Tenderness: There is no abdominal tenderness.  Musculoskeletal:     Cervical back: Normal range of motion and neck supple.  Skin:    General: Skin is warm and dry.  Neurological:     Mental Status: He is alert.  Psychiatric:        Behavior: Behavior normal.      ED Results / Procedures / Treatments   Labs (all labs ordered are listed, but only abnormal results are displayed) Labs Reviewed - No data to display  EKG None  Radiology No results found.  Procedures .Critical Care Performed by: Margarita Mail, PA-C Authorized by: Margarita Mail, PA-C   Critical care provider statement:    Critical care time (minutes):  60   Critical care time was exclusive of:  Separately billable procedures and treating other patients   Critical care was necessary to treat or prevent imminent or life-threatening deterioration of the following conditions:  Circulatory failure and metabolic crisis   Critical care was time spent personally by me on the following activities:  Discussions with consultants, evaluation of patient's response to treatment, examination of patient, ordering and performing treatments and interventions, ordering and review of laboratory studies, ordering and review of radiographic studies, pulse oximetry, re-evaluation of patient's condition, obtaining history from patient or surrogate and review of old charts    Medications Ordered in ED Medications - No data to display  ED Course  I have reviewed the triage vital signs and the nursing notes.  Pertinent labs & imaging results that were available during my care of the patient were reviewed by me and considered in my medical decision making (see chart for details).  Clinical Course as of 09/17/20 2025  Nancy Fetter Sep 17, 2020  1635 Potassium(!!): 2.7 [AH]  1635 Calcium Ionized(!): 1.05 [AH]  1805 Ammonia(!): 256 [AH]  1808 Anion gap(!): 28 [AH]  1809 Total Bilirubin(!): 6.8 [AH]    Clinical Course User Index [AH] Margarita Mail, PA-C   MDM Rules/Calculators/A&P                          50 year old male with a past medical history of alcoholism who presents after 7 witnessed seizures today. The differential diagnosis for includes but is not limited to idiopathic seizure,  traumatic brain injury, intracranial hemorrhage, vascular lesion, mass or space containing lesion, degenerative neurologic disease, congenital brain abnormality, infectious etiology such as meningitis, encephalitis or abscess, metabolic disturbance including hyper or hypoglycemia, hyper or hyponatremia, hyperosmolar state, uremia, hepatic failure, hypocalcemia, hypomagnesemia.  Toxic substances such as cocaine, lidocaine, antidepressants, theophylline, alcohol withdrawal, drug withdrawal, eclampsia, hypertensive encephalopathy and anoxic brain injury. Patient appears to still be heavily drinking and there is potential for alcohol withdrawal seizures.  I ordered and reviewed labs that include his CBC shows hemoglobin of 7.6 which is slightly below his average of 9.6 may be due to alcohol abuse.  Patient's Dilantin  level is below therapeutic level likely as he is not taking the medication.  Urine without evidence of infection.  Patient's PT/INR is elevated and ammonia level is 256.  CMP shows hypokalemia at 2.7.  He has a low magnesium level, bilirubin elevated to 6.8 with an anion gap acidosis of 28 likely due to lactic acidosis after multiple seizures today.  I ordered and reviewed a CT head which shows decreased attenuation the right frontal lobe concerning for potential acute or subacute infarct.1 view chest x-ray shows no acute abnormalities. Case discussed with Dr. Curly Shores and she advises we get an MRI of the brain.  I attempted to obtain an MRI of the brain twice however patient is combative and unable to sit still and this may be secondary to his ammonia level or alcohol withdrawal symptoms..  I have discussed the case with Dr. Jonelle Sidle who will admit the patient.  Patient has gotten a dose of his lactulose enema.  He has been given IV potassium, magnesium, thiamine, fluids and remains confused.  Patient is critically ill Final Clinical Impression(s) / ED Diagnoses Final diagnoses:  None    Rx / DC  Orders ED Discharge Orders    None       Margarita Mail, PA-C 09/19/20 1556    Carmin Muskrat, MD 09/25/20 1003

## 2020-09-18 ENCOUNTER — Inpatient Hospital Stay (HOSPITAL_COMMUNITY): Payer: 59

## 2020-09-18 ENCOUNTER — Encounter (HOSPITAL_COMMUNITY): Payer: Self-pay | Admitting: Internal Medicine

## 2020-09-18 DIAGNOSIS — D62 Acute posthemorrhagic anemia: Secondary | ICD-10-CM | POA: Diagnosis not present

## 2020-09-18 DIAGNOSIS — K7200 Acute and subacute hepatic failure without coma: Secondary | ICD-10-CM

## 2020-09-18 DIAGNOSIS — K7030 Alcoholic cirrhosis of liver without ascites: Secondary | ICD-10-CM

## 2020-09-18 DIAGNOSIS — F1010 Alcohol abuse, uncomplicated: Secondary | ICD-10-CM | POA: Diagnosis not present

## 2020-09-18 DIAGNOSIS — R569 Unspecified convulsions: Secondary | ICD-10-CM

## 2020-09-18 LAB — COMPREHENSIVE METABOLIC PANEL
ALT: 21 U/L (ref 0–44)
AST: 87 U/L — ABNORMAL HIGH (ref 15–41)
Albumin: 2.6 g/dL — ABNORMAL LOW (ref 3.5–5.0)
Alkaline Phosphatase: 152 U/L — ABNORMAL HIGH (ref 38–126)
Anion gap: 13 (ref 5–15)
BUN: 5 mg/dL — ABNORMAL LOW (ref 6–20)
CO2: 21 mmol/L — ABNORMAL LOW (ref 22–32)
Calcium: 7.7 mg/dL — ABNORMAL LOW (ref 8.9–10.3)
Chloride: 96 mmol/L — ABNORMAL LOW (ref 98–111)
Creatinine, Ser: 0.64 mg/dL (ref 0.61–1.24)
GFR, Estimated: >60 mL/min (ref >60)
Glucose, Bld: 206 mg/dL — ABNORMAL HIGH (ref 70–99)
Potassium: 2.8 mmol/L — ABNORMAL LOW (ref 3.5–5.1)
Sodium: 130 mmol/L — ABNORMAL LOW (ref 135–145)
Total Bilirubin: 6.5 mg/dL — ABNORMAL HIGH (ref 0.3–1.2)
Total Protein: 6.7 g/dL (ref 6.5–8.1)

## 2020-09-18 LAB — GLUCOSE, CAPILLARY
Glucose-Capillary: 223 mg/dL — ABNORMAL HIGH (ref 70–99)
Glucose-Capillary: 161 mg/dL — ABNORMAL HIGH (ref 70–99)
Glucose-Capillary: 106 mg/dL — ABNORMAL HIGH (ref 70–99)
Glucose-Capillary: 162 mg/dL — ABNORMAL HIGH (ref 70–99)
Glucose-Capillary: 151 mg/dL — ABNORMAL HIGH (ref 70–99)

## 2020-09-18 LAB — PREPARE RBC (CROSSMATCH)

## 2020-09-18 LAB — CBC
HCT: 19.2 % — ABNORMAL LOW (ref 39.0–52.0)
Hemoglobin: 6.7 g/dL — CL (ref 13.0–17.0)
MCH: 27.3 pg (ref 26.0–34.0)
MCHC: 34.9 g/dL (ref 30.0–36.0)
MCV: 78.4 fL — ABNORMAL LOW (ref 80.0–100.0)
Platelets: 69 10*3/uL — ABNORMAL LOW (ref 150–400)
RBC: 2.45 MIL/uL — ABNORMAL LOW (ref 4.22–5.81)
RDW: 19.9 % — ABNORMAL HIGH (ref 11.5–15.5)
WBC: 7.6 10*3/uL (ref 4.0–10.5)
nRBC: 0 % (ref 0.0–0.2)

## 2020-09-18 LAB — IRON AND TIBC
Iron: 33 ug/dL — ABNORMAL LOW (ref 45–182)
Saturation Ratios: 10 % — ABNORMAL LOW (ref 17.9–39.5)
TIBC: 342 ug/dL (ref 250–450)
UIBC: 309 ug/dL

## 2020-09-18 LAB — MAGNESIUM: Magnesium: 1.6 mg/dL — ABNORMAL LOW (ref 1.7–2.4)

## 2020-09-18 LAB — PROTIME-INR
INR: 1.4 — ABNORMAL HIGH (ref 0.8–1.2)
Prothrombin Time: 16.9 seconds — ABNORMAL HIGH (ref 11.4–15.2)

## 2020-09-18 LAB — VITAMIN B12: Vitamin B-12: 820 pg/mL (ref 180–914)

## 2020-09-18 LAB — PHOSPHORUS: Phosphorus: 1.5 mg/dL — ABNORMAL LOW (ref 2.5–4.6)

## 2020-09-18 LAB — AMMONIA: Ammonia: 126 umol/L — ABNORMAL HIGH (ref 9–35)

## 2020-09-18 LAB — HEMOGLOBIN A1C
Hgb A1c MFr Bld: 10.2 % — ABNORMAL HIGH (ref 4.8–5.6)
Mean Plasma Glucose: 246.04 mg/dL

## 2020-09-18 LAB — FERRITIN: Ferritin: 28 ng/mL (ref 24–336)

## 2020-09-18 LAB — FOLATE: Folate: 24.1 ng/mL (ref >5.9)

## 2020-09-18 LAB — HEMOGLOBIN AND HEMATOCRIT, BLOOD
HCT: 27.9 % — ABNORMAL LOW (ref 39.0–52.0)
Hemoglobin: 9.2 g/dL — ABNORMAL LOW (ref 13.0–17.0)

## 2020-09-18 MED ORDER — LACTATED RINGERS IV SOLN: INTRAVENOUS | Status: AC

## 2020-09-18 MED ORDER — INSULIN STARTER KIT- PEN NEEDLES (ENGLISH)
1.0000 | Freq: Once | Status: AC
  Administered 2020-09-18: 1
  Filled 2020-09-18: qty 1

## 2020-09-18 MED ORDER — SODIUM CHLORIDE 0.9% IV SOLUTION: Freq: Once | INTRAVENOUS | Status: DC

## 2020-09-18 MED ORDER — LACTULOSE ENEMA
300.0000 mL | Freq: Three times a day (TID) | ORAL | Status: DC
  Filled 2020-09-18: qty 300

## 2020-09-18 MED ORDER — HEPARIN SODIUM (PORCINE) 5000 UNIT/ML IJ SOLN: 5000.0000 [IU] | Freq: Three times a day (TID) | INTRAMUSCULAR | Status: DC

## 2020-09-18 MED ORDER — FOLIC ACID 1 MG PO TABS
1.0000 mg | ORAL_TABLET | Freq: Every day | ORAL | Status: DC
  Administered 2020-09-19 – 2020-09-21 (×3): 1 mg via ORAL
  Filled 2020-09-18 (×4): qty 1

## 2020-09-18 MED ORDER — MAGNESIUM SULFATE 2 GM/50ML IV SOLN
2.0000 g | Freq: Once | INTRAVENOUS | Status: AC
  Administered 2020-09-18: 2 g via INTRAVENOUS
  Filled 2020-09-18: qty 50

## 2020-09-18 MED ORDER — LACTULOSE 10 GM/15ML PO SOLN: 20.0000 g | Freq: Three times a day (TID) | ORAL | Status: DC

## 2020-09-18 MED ORDER — LIVING WELL WITH DIABETES BOOK
Freq: Once | Status: AC
  Filled 2020-09-18: qty 1

## 2020-09-18 MED ORDER — PANTOPRAZOLE SODIUM 40 MG IV SOLR
40.0000 mg | Freq: Two times a day (BID) | INTRAVENOUS | Status: DC
  Administered 2020-09-18 – 2020-09-19 (×2): 40 mg via INTRAVENOUS
  Filled 2020-09-18 (×3): qty 40

## 2020-09-18 MED ORDER — THIAMINE HCL 100 MG/ML IJ SOLN: 100.0000 mg | Freq: Every day | INTRAMUSCULAR | Status: DC

## 2020-09-18 MED ORDER — SODIUM CHLORIDE 0.9 % IV SOLN
2.0000 g | INTRAVENOUS | Status: DC
  Administered 2020-09-18 – 2020-09-21 (×4): 2 g via INTRAVENOUS
  Filled 2020-09-18 (×2): qty 2
  Filled 2020-09-18 (×4): qty 20

## 2020-09-18 MED ORDER — THIAMINE HCL 100 MG PO TABS: 100.0000 mg | ORAL_TABLET | Freq: Every day | ORAL | Status: DC

## 2020-09-18 MED ORDER — SODIUM CHLORIDE 0.9 % IV SOLN
50.0000 ug/h | INTRAVENOUS | Status: DC
  Administered 2020-09-18 – 2020-09-21 (×6): 50 ug/h via INTRAVENOUS
  Filled 2020-09-18 (×7): qty 1

## 2020-09-18 MED ORDER — ADULT MULTIVITAMIN W/MINERALS CH
1.0000 | ORAL_TABLET | Freq: Every day | ORAL | Status: DC
  Administered 2020-09-18 – 2020-09-21 (×4): 1 via ORAL
  Filled 2020-09-18 (×4): qty 1

## 2020-09-18 MED ORDER — KCL-LACTATED RINGERS-D5W 20 MEQ/L IV SOLN
INTRAVENOUS | Status: DC
  Filled 2020-09-18: qty 1000

## 2020-09-18 MED ORDER — PHENYTOIN SODIUM 50 MG/ML IJ SOLN
100.0000 mg | Freq: Three times a day (TID) | INTRAMUSCULAR | Status: DC
  Administered 2020-09-18: 100 mg via INTRAVENOUS
  Filled 2020-09-18: qty 2

## 2020-09-18 MED ORDER — OCTREOTIDE LOAD VIA INFUSION
50.0000 ug | Freq: Once | INTRAVENOUS | Status: AC
  Administered 2020-09-18: 50 ug via INTRAVENOUS
  Filled 2020-09-18: qty 25

## 2020-09-18 MED ORDER — LORAZEPAM 1 MG PO TABS
1.0000 mg | ORAL_TABLET | ORAL | Status: AC | PRN
  Administered 2020-09-19: 2 mg via ORAL
  Filled 2020-09-18: qty 2

## 2020-09-18 MED ORDER — THIAMINE HCL 100 MG/ML IJ SOLN
500.0000 mg | Freq: Three times a day (TID) | INTRAVENOUS | Status: AC
  Administered 2020-09-18 – 2020-09-21 (×8): 500 mg via INTRAVENOUS
  Filled 2020-09-18 (×10): qty 5

## 2020-09-18 MED ORDER — SODIUM CHLORIDE 0.9 % IV SOLN
80.0000 mg | Freq: Once | INTRAVENOUS | Status: AC
  Administered 2020-09-18: 80 mg via INTRAVENOUS
  Filled 2020-09-18: qty 80

## 2020-09-18 MED ORDER — LEVETIRACETAM IN NACL 500 MG/100ML IV SOLN
500.0000 mg | Freq: Two times a day (BID) | INTRAVENOUS | Status: DC
  Administered 2020-09-18 – 2020-09-21 (×7): 500 mg via INTRAVENOUS
  Filled 2020-09-18 (×7): qty 100

## 2020-09-18 MED ORDER — LACTULOSE 10 GM/15ML PO SOLN
30.0000 g | Freq: Three times a day (TID) | ORAL | Status: DC
  Administered 2020-09-18 – 2020-09-21 (×7): 30 g via ORAL
  Filled 2020-09-18 (×8): qty 60

## 2020-09-18 MED ORDER — SODIUM CHLORIDE 0.9% FLUSH
10.0000 mL | Freq: Two times a day (BID) | INTRAVENOUS | Status: DC
  Administered 2020-09-18 – 2020-09-20 (×5): 10 mL

## 2020-09-18 MED ORDER — THIAMINE HCL 100 MG/ML IJ SOLN
250.0000 mg | Freq: Every day | INTRAVENOUS | Status: DC
  Administered 2020-09-21: 250 mg via INTRAVENOUS
  Filled 2020-09-18 (×2): qty 2.5

## 2020-09-18 MED ORDER — LORAZEPAM 2 MG/ML IJ SOLN: 1.0000 mg | INTRAMUSCULAR | Status: AC | PRN

## 2020-09-18 MED ORDER — LACTULOSE ENEMA: 300.0000 mL | Freq: Two times a day (BID) | ORAL | Status: DC

## 2020-09-18 MED ORDER — POTASSIUM CHLORIDE 10 MEQ/100ML IV SOLN
10.0000 meq | INTRAVENOUS | Status: AC
Start: 2020-09-18 — End: 2020-09-18
  Administered 2020-09-18 (×3): 10 meq via INTRAVENOUS
  Filled 2020-09-18 (×2): qty 100

## 2020-09-18 MED ORDER — INSULIN ASPART 100 UNIT/ML ~~LOC~~ SOLN
0.0000 [IU] | SUBCUTANEOUS | Status: DC
  Administered 2020-09-18: 3 [IU] via SUBCUTANEOUS
  Administered 2020-09-18 – 2020-09-19 (×5): 2 [IU] via SUBCUTANEOUS
  Administered 2020-09-19 (×2): 3 [IU] via SUBCUTANEOUS
  Administered 2020-09-20: 1 [IU] via SUBCUTANEOUS
  Administered 2020-09-20: 3 [IU] via SUBCUTANEOUS
  Administered 2020-09-20: 1 [IU] via SUBCUTANEOUS
  Administered 2020-09-20: 2 [IU] via SUBCUTANEOUS
  Administered 2020-09-20: 3 [IU] via SUBCUTANEOUS
  Administered 2020-09-21: 5 [IU] via SUBCUTANEOUS
  Administered 2020-09-21 (×2): 3 [IU] via SUBCUTANEOUS

## 2020-09-18 MED ORDER — POTASSIUM CHLORIDE 10 MEQ/100ML IV SOLN
10.0000 meq | INTRAVENOUS | Status: AC
  Administered 2020-09-18 (×3): 10 meq via INTRAVENOUS
  Filled 2020-09-18: qty 100

## 2020-09-18 MED ORDER — K PHOS MONO-SOD PHOS DI & MONO 155-852-130 MG PO TABS
500.0000 mg | ORAL_TABLET | Freq: Four times a day (QID) | ORAL | Status: AC
  Administered 2020-09-18 – 2020-09-19 (×7): 500 mg via ORAL
  Filled 2020-09-18 (×8): qty 2

## 2020-09-18 MED ORDER — ONDANSETRON HCL 4 MG PO TABS: 4.0000 mg | ORAL_TABLET | Freq: Four times a day (QID) | ORAL | Status: DC | PRN

## 2020-09-18 MED ORDER — ONDANSETRON HCL 4 MG/2ML IJ SOLN: 4.0000 mg | Freq: Four times a day (QID) | INTRAMUSCULAR | Status: DC | PRN

## 2020-09-18 MED ORDER — LACTULOSE ENEMA
300.0000 mL | Freq: Three times a day (TID) | ORAL | Status: DC
  Filled 2020-09-18 (×2): qty 300

## 2020-09-18 MED ORDER — THIAMINE HCL 100 MG/ML IJ SOLN
Freq: Once | INTRAVENOUS | Status: AC
  Filled 2020-09-18: qty 1000

## 2020-09-18 MED ORDER — SODIUM CHLORIDE 0.9% FLUSH
10.0000 mL | INTRAVENOUS | Status: DC | PRN
Start: 2020-09-18 — End: 2020-09-21

## 2020-09-18 NOTE — Progress Notes (Signed)
PROGRESS NOTE    Gary Mitchell  IWL:[id number] DOB: May 20, 1971 DOA: 09/17/2020 PCP: Charlott Rakes, MD   Chief Complaint  Patient presents with  . Seizures   Brief Narrative:  Gary Mitchell is a 50 y.o. male with medical history significant of alcohol abuse, alcoholic liver disease, seizure disorder on Keppra, recent COVID-19 pneumonia in January, GERD, anemia of chronic disease who drinks excessively apparently staying with his mother.  Mother is not aware or other nausea of the MRI but sees a lot of empty bottles.  She was at work today when patient had multiple seizures.  Apparently had 4 seizures prior to arrival of EMS.  He then had witnessed seizures x3 by EMS.  He is currently obtunded not able to communicate.  He was supposed to be on antiseizure medications but levels were checked and nothing was in his system.  Mother said he has been having vomiting for the last several days and has not taken his medications.  Patient was also found to have ammonia level of more than 200.  His drug screen is only positive for benzodiazepine.  Alcohol level currently pending but has been less than 10 previously.  Head CT showed possible acute CVA.  Subsequent MRI attempted but patient unable to go through with it due to agitation.  He has been admitted therefore to the hospital for further evaluation and treatment.  ED Course: Temperature 97.7 blood pressure 142/86 pulse 190 respirate 25 oxygen sat is currently 100% on 2 L White count 7.3 hemoglobin 9.2 platelets 69 sodium 127 potassium 2.0 chloride 88 CO2 11 BUN 6 creatinine 0.41 calcium 8.5.  Chest x-ray showed no acute findings.  Head CT without contrast showed decreased attenuation in the right frontal lobe concerning for recent and potential acute infarct.  Patient being admitted to progressive care for further treatment.  Assessment & Plan:   Principal Problem:   Seizure ([place]) Active Problems:   Alcohol abuse   Thrombocytopenia ([place])    COVID-19 virus infection   GERD (gastroesophageal reflux disease)   Alcoholic cirrhosis of liver without ascites (H[facility])   Acute hepatic encephalopathy   Hypomagnesemia   Suspected CVA (cerebral vascular accident) ([place])   Hypokalemia  #1 multiple seizures: Multiple seizures reported prior to admission.  Hx seizure disorder not taking meds as well as hx etoh abuse, possible withdrawals.   S/p keppra load.   Continue keppra 500 mg q12 IV Seizure precautions Do not give ativan for a seizure that does not last longer than 3 min (per neurology) Neurology c/s, appreciate recs  # Hepatic Encephalopathy  Alcoholic Cirrhosis Lactulose PO or PR Ammonia 256 on presentation, improving  [place] without ascities Bili 6.5, platelets 69, INR 1.4, albumin 2.6 maddrey's discriminant function 14.3 GI consult, appreciate recs  # Concern for Acute Blood Loss Anemia  Concern for GI bleed In setting of iron def anemia and cirrhosis, concerning for GI bleed PPI gtt, octreotide, ceftriaxone GI c/s, appreciate recs - planning to follow for EGD when able  Transfuse and follow   # Acute Metabolic Encephalopathy Multiple reasons, hepatic encephalopathy, etoh abuse/possible withdrawal High dose thiamine B12 wnl, folate wnl.  Follow TSH. Delirium precautions.  Head CT at presentation concerning for possible acute infarct -> MRI brain 3/14 motion degraded and abbreviated - no acute or reversible finding  #3 hypokalemia  Hypomagnesemia  Hypophosphatemia: replace and follow   #5 GERD: Continue with PPIs  #6 recent COVID-19 infection: Patient way outside the window for infectiousness.  Having January.  Continue to monitor  DVT prophylaxis: SCD Code Status: full  Family Communication:mother at bedside3 Disposition:   Status is: Inpatient  Remains inpatient appropriate because:Inpatient level of care appropriate due to severity of illness   Dispo: The patient is from: Home              Anticipated  d/c is to: pending              Patient currently is not medically stable to d/c.   Difficult to place patient No       Consultants:   GI  neurology  Procedures:  EEG  IMPRESSION: This study is suggestive of mild to moderate diffuse encephalopathy, nonspecific etiology. No seizures or epileptiform discharges were seen throughout the recording.  Antimicrobials:  Anti-infectives (From admission, onward)   Start     Dose/Rate Route Frequency Ordered Stop   09/18/20 0530  cefTRIAXone (ROCEPHIN) 2 g in sodium chloride 0.9 % 100 mL IVPB        2 g 200 mL/hr over 30 Minutes Intravenous Every 24 hours 09/18/20 0442 09/25/20 0529      Subjective: Confused Says he's here for seizures  Objective: Vitals:   09/18/20 0836 09/18/20 0902 09/18/20 1100 09/18/20 1500  BP: 112/78 98/70 124/83 (!) 142/73  Pulse: 99 92 100 98  Resp: 20 20 18 18   Temp: 98.1 F (36.7 C) 98.2 F (36.8 C) 98.2 F (36.8 C) 97.9 F (36.6 C)  TempSrc: Oral Oral Oral Oral  SpO2: 100% 100% 100% 98%    Intake/Output Summary (Last 24 hours) at 09/18/2020 1904 Last data filed at 09/18/2020 1400 Gross per 24 hour  Intake -  Output 1600 ml  Net -1600 ml   There were no vitals filed for this visit.  Examination:  General exam: Appears calm and comfortable  Respiratory system: Clear to auscultation. Respiratory effort normal. Cardiovascular system: S1 & S2 heard, RRR.  Gastrointestinal system: Abdomen is nondistended, soft and nontender. Central nervous system: Alert, but disoriented.  Moving all extremities.  Follows commands. Extremities: no LEE Skin: No rashes, lesions or ulcers Psychiatry: Judgement and insight appear normal. Mood & affect appropriate.     Data Reviewed: I have personally reviewed following labs and imaging studies  CBC: Recent Labs  Lab 09/17/20 1545 09/17/20 1606 09/18/20 0217  WBC 7.3  --  7.6  NEUTROABS 4.9  --   --   HGB 7.6* 9.2* 6.7*  HCT 23.8* 27.0* 19.2*  MCV  84.1  --  78.4*  PLT 69*  --  69*    Basic Metabolic Panel: Recent Labs  Lab 09/17/20 1545 09/17/20 1606 09/18/20 0217  NA 125* 127* 130*  K 2.7* 2.7* 2.8*  CL 86* 88* 96*  CO2 11*  --  21*  GLUCOSE 254* 257* 206*  BUN 6 6 5*  CREATININE 0.79 0.40* 0.64  CALCIUM 8.5*  --  7.7*  MG 1.5*  --  1.6*  PHOS  --   --  1.5*    GFR: CrCl cannot be calculated (Unknown ideal weight.).  Liver Function Tests: Recent Labs  Lab 09/17/20 1545 09/18/20 0217  AST 107* 87*  ALT 24 21  ALKPHOS 162* 152*  BILITOT 6.8* 6.5*  PROT 7.7 6.7  ALBUMIN 3.0* 2.6*    CBG: Recent Labs  Lab 09/17/20 1540 09/18/20 0913 09/18/20 1231 09/18/20 1612  GLUCAP 260* 223* 161* 106*     No results found for this or any previous visit (from  the past 240 hour(s)).       Radiology Studies: CT HEAD WO CONTRAST  Result Date: 09/17/2020 CLINICAL DATA:  Seizure EXAM: CT HEAD WITHOUT CONTRAST TECHNIQUE: Contiguous axial images were obtained from the base of the skull through the vertex without intravenous contrast. COMPARISON:  September 30, 2014 FINDINGS: Brain: Ventricles and sulci are normal in size and configuration. There is no intracranial mass, hemorrhage, extra-axial fluid collection, or midline shift. There is decreased attenuation in the mid right frontal lobe, a finding concerning for potential acute infarct in this area. Elsewhere brain parenchyma appears unremarkable. No evident acute infarct. Vascular: No hyperdense vessel. There is slight calcification in each carotid siphon region. Skull: Bony calvarium appears intact. Sinuses/Orbits: There is a retention cyst in the superior right maxillary antrum. Paranasal sinuses are clear. Orbits appear symmetric bilaterally. Other: Mastoid air cells are clear. IMPRESSION: Decreased attenuation in the right frontal lobe midportion concerning for recent and potentially acute infarct. Elsewhere brain parenchyma appears unremarkable. No mass or hemorrhage  appreciable. Mild arterial vascular calcification noted. Retention cyst in right maxillary antrum. 2325 Electronically Signed   By: Lowella Grip III M.D.   On: 09/17/2020 16:26   MR BRAIN WO CONTRAST  Result Date: 09/18/2020 CLINICAL DATA:  Seizure. EXAM: MRI HEAD WITHOUT CONTRAST TECHNIQUE: Multiplanar, multiecho pulse sequences of the brain and surrounding structures were obtained without intravenous contrast. COMPARISON:  Head CT yesterday. FINDINGS: Brain: The patient was uncooperative and would not tolerate complete imaging. There is a great deal of motion degradation. These factors make accurate interpretation difficulty. There is some apparent bright signal on diffusion imaging affecting the brainstem and cerebral peduncles, but I think this is probably artifactual. There is scattered foci of abnormal T2 and FLAIR signal within the cerebral hemispheric white matter suggesting old small vessel disease. No evidence of mass lesion, hemorrhage, hydrocephalus or extra-axial collection. Mesial temporal lobes appear symmetric without definable lesion. Vascular: Major vessels at the base of the brain show flow. Skull and upper cervical spine: Negative Sinuses/Orbits: Clear except for a right maxillary sinus retention cyst. Orbits negative. Other: None IMPRESSION: Abbreviated and motion degraded exam. No acute or reversible finding. Mild chronic small-vessel ischemic change of the cerebral hemispheric white matter. No specific abnormality seen to explain seizure. Diffusion imaging shows some high signal in the brainstem and cerebral peduncles and lower radiating white matter tracts, but does not show true restricted diffusion on the ADC map and therefore I think the finding is artifactual. Electronically Signed   By: Nelson Chimes M.D.   On: 09/18/2020 18:26   DG Chest Port 1 View  Result Date: 09/17/2020 CLINICAL DATA:  Seizure. EXAM: PORTABLE CHEST 1 VIEW COMPARISON:  July 09, 2020 FINDINGS: The  cardiomediastinal silhouette is stable. No focal infiltrate. No pneumothorax. Healed left rib fracture. IMPRESSION: No active disease. Electronically Signed   By: Dorise Bullion III M.D   On: 09/17/2020 16:00   EEG adult  Result Date: 09/18/2020 Lora Havens, MD     09/18/2020 11:42 AM Patient Name: Gary Mitchell MRN: [id number] Epilepsy Attending: Lora Havens Referring Physician/Provider: Clance Boll, NP Date: 09/18/2020 Duration: 25.45 mins Patient history: 50 year old male with history of epilepsy presented with altered mental status.  EEG to evaluate for seizures. Level of alertness: Awake, asleep AEDs during EEG study: Keppra Technical aspects: This EEG study was done with scalp electrodes positioned according to the 10-20 International system of electrode placement. Electrical activity was acquired at a sampling rate of 500Hz   and reviewed with a high frequency filter of 70Hz  and a low frequency filter of 1Hz . EEG data were recorded continuously and digitally stored. Description: The posterior dominant rhythm consists of 8-9 Hz activity of moderate voltage (25-35 uV) seen predominantly in posterior head regions, symmetric and reactive to eye opening and eye closing. Sleep was characterized by vertex waves, sleep spindles (12 to 14 Hz), maximal frontocentral region.  EEG showed continuous generalized 3 to 6 Hz theta-delta slowing.  Hyperventilation and photic stimulation were not performed.   ABNORMALITY -Continuous slow, generalized IMPRESSION: This study is suggestive of mild to moderate diffuse encephalopathy, nonspecific etiology. No seizures or epileptiform discharges were seen throughout the recording. Priyanka Barbra Sarks   [place] ASCITES (ABDOMEN LIMITED)  Result Date: 09/18/2020 CLINICAL DATA:  Cirrhosis.  Evaluate for ascites EXAM: LIMITED ABDOMEN ULTRASOUND FOR ASCITES TECHNIQUE: Limited ultrasound survey for ascites was performed in all four abdominal quadrants. COMPARISON:  None.  FINDINGS: Evaluation of the 4 quadrants of the abdomen demonstrates no measurable ascites. IMPRESSION: No ascites. Electronically Signed   By: Suzy Bouchard M.D.   On: 09/18/2020 16:16        Scheduled Meds: . sodium chloride   Intravenous Once  . folic acid  1 mg Oral Daily  . insulin aspart  0-9 Units Subcutaneous Q4H  . lactulose  300 mL Rectal TID  . multivitamin with minerals  1 tablet Oral Daily  . pantoprazole (PROTONIX) IV  40 mg Intravenous Q12H  . phenytoin (DILANTIN) IV  100 mg Intravenous Q8H  . phosphorus  500 mg Oral QID  . sodium chloride flush  10-40 mL Intracatheter Q12H  . [START ON 09/26/2020] thiamine  100 mg Oral Daily   Continuous Infusions: . cefTRIAXone (ROCEPHIN)  IV 2 g (09/18/20 2878)  . lactated ringers 125 mL/hr at 09/18/20 1522  . levETIRAcetam 500 mg (09/18/20 0444)  . octreotide  (SANDOSTATIN)    IV infusion 50 mcg/hr (09/18/20 0710)  . thiamine injection 500 mg (09/18/20 1650)   Followed by  . [START ON 09/21/2020] thiamine injection       LOS: 1 day    Time spent: over 55 min    Fayrene Helper, MD [facility]   To contact the attending provider between 7A-7P or the covering provider during after hours 7P-7A, please log into the web site [email or web address] and access using universal [place] password for that web site. If you do not have the password, please call the hospital operator.  09/18/2020, 7:04 PM

## 2020-09-18 NOTE — Progress Notes (Addendum)
HOSPITAL MEDICINE OVERNIGHT EVENT NOTE    Notified by nursing that patient had a bedside swallow screen performed and patient passed without issue.  Patient is becoming much more awake and alert and nursing has requested that patient be switched to oral lactulose as opposed to lactulose enemas.  Of note, patient has had no further evidence of bloody stool/melena today.  Due to concerns for possible recurrent bleeding, we will transition patient to clear liquid diet only and switch lactulose to 30 g p.o. 3 times daily.  Monitoring to ensure patient tolerates and has no recurrence of bleeding.  Vernelle Emerald  MD [facility]   ADDENDUM (3:30AM 3/15)  Nursing requesting mild analgesic for pain related to patient's IV.  Will place patient on low-dose Tylenol (325mg  Q6hrs PRN) considering history of cirrhosis.  Sherryll Burger Jatoya Armbrister

## 2020-09-18 NOTE — Progress Notes (Signed)
VAST consulted for midline placement d/t multiple medications.  Educated unit RN that midline is not made for multiple meds as it is a long PIV; incompatible meds cannot be administered at same time through line. Further educated that caustic meds including potassium chloride and magnesium should NOT be administered through a midline and Vancomycin can only be administered through midline for less than 6 days. Written note with above information taped to midline sign at bedside. Also verbally explained to unit RN importance of this information being passed along during shift report for patient's safety. Oneita Kras, RN verbalized understanding.  Second IV access placed in patient's left anterior forearm. Third IV access continuing to infuse in patient's right arm.

## 2020-09-18 NOTE — Progress Notes (Addendum)
Inpatient Diabetes Program Recommendations  AACE/ADA: New Consensus Statement on Inpatient Glycemic Control (2015)  Target Ranges:  Prepandial:   less than 140 mg/dL      Peak postprandial:   less than 180 mg/dL (1-2 hours)      Critically ill patients:  140 - 180 mg/dL   Lab Results  Component Value Date   GLUCAP 223 (H) 09/18/2020   HGBA1C 10.2 (H) 09/18/2020    Review of Glycemic Control Results for Gary Mitchell, Gary Mitchell (MRN [id number]) as of 09/18/2020 10:10  Ref. Range 11/28/2019 05:13 09/18/2020 08:41  Hemoglobin A1C Latest Ref Range: 4.8 - 5.6 % 4.3 (L) 10.2 (H)  Results for Gary Mitchell, Gary Mitchell (MRN [id number]) as of 09/18/2020 15:37  Ref. Range 09/18/2020 09:13 09/18/2020 12:31  Glucose-Capillary Latest Ref Range: 70 - 99 mg/dL 223 (H) 161 (H)   Diabetes history:  Do not see prior history of DM  Current orders for Inpatient glycemic control:  Novolog 0-9 units Q4 hours  A1c 10.2 on 3/14 Has been to [facility], Dr. Margarita Rana, Charlane Ferretti last visit on 01/12/20  Novolog Correction scale just started this am.  Looks like new diagnosis of Diabetes. COVID in January this year most likely with steroid use.  Will follow insulin needs and glucose trends. May benefit from insulin if agreeable at time of d/c. However, may also do well on 2 oral medications. Will see and assess pt today.  Addendum 330 pm: Spoke with pt and mom regarding new diabetes diagnosis at bedside. Pt lives with mother and mother is trying to get pt medicaid but he was denied. She is trying to get orange card for pt now. Pts mother is a DM and takes insulin and 2 orals at home. Discussed how pt is controlled by sliding scale only and maybe ok to go home on oral meds only and check glucose 1-2 times a day, follow up with PCP. Discussed pts A1c level. Discussed glucose and A1c goals. Per mom pt eats a DM diet. Pt drinks regular drinks. Discussed drinking diet, zero, light beverages. Discussed exercise 30 minutes 5 days a week. Pt reports not  working currently and does not have insurance.  D/C: Blood glucose meter kit order # [id number]  Thanks,  Tama Headings RN, MSN, BC-ADM Inpatient Diabetes Coordinator Team Pager [phone] (8a-5p)

## 2020-09-18 NOTE — Consult Note (Addendum)
[facility] Consult: 10:08 AM 09/18/2020  LOS: 1 day    Referring Provider: Dr Janyce Llanos  Primary Care Physician:  Charlott Rakes, MD Primary Gastroenterologist:  Dr. Owens Loffler    Reason for Consultation:  Melena.  Encephalopathy in pt w ETOH liver disease.     HPI: Gary Mitchell is a 50 y.o. male.  PMH nephrectomy as and infant.  Long standing seizure disorder and noncompliant with antiseizure meds due to cost and lack of medical access.  Alcoholism.  Alcoholic cirrhosis  (acute hepatitis serologies all negative in 11/2019). Anemia of chronic disease.  Thrombocytopenia.  Coagulopathy. In May 2021, when LFTs and lipase were elevated, diagnosed with severe alcoholic hepatitis, cirrhosis, mild alcoholic pancreatitis.  Ultrasound confirmed cirrhotic looking liver, stones and sludge, GB wall thickening and small pericholecystic fluid with normal Dopplers. CTAP 11/23/2019 confirmed focal pancreatitis at [facility], hepatic steatosis, hepatomegaly, recanalization of periumbilical vein suspicious for portal htn.  MRCP 11/24/2019 w 1.4 cm infiltrating lesion in the body of the pancreas worrisome for neoplasm/adeno CA.  Atrophy in the tail of the pancreas and dilated main PD.  Decreased perfusion at the pancreatic head might be pancreatitis but cannot exclude a small lesion measuring 14 mm.  Cirrhosis of the liver with regenerating nodules none of which are dysplastic.  Portal venous hypertension and portal venous collaterals, no splenomegaly.  Cholelithiasis.  Enlarged peripancreatic and celiac axis lymph nodes worrisome for metastatic adenopathy. Plans for possible EUS/FNA deferred to outpatient setting but ultimately never performed.  Treated with prednisolone during hospitalization, 3 week  prednisone taper at discharge for  alcoholic hepatitis.  Treated with vitamin K for elevated INR. Followed up in late June 2021 with Dr. Ardis Hughs.  MD not convinced the patient had pancreatic malignancy.  01/01/2020 MRI abdomen showed the mass at the pancreatic body and tail was less clearly appreciated and preponderance of fluid signal in the area.  Increased dilation of pancreatic duct distally.  New 3.7 cm fluid collection anterior to the pancreas body/adjacent to lesser curvature of stomach most consistent with new pseudocyst.  No obvious signs of malignancy though impossible to exclude malignancy.  Cirrhotic appearance to the liver. Patient continued/continues alcohol consumption.  Hospital consult with [facility] 07/09/2020 for anemia and cirrhosis, meld 24..  Hgb 6.7 >> 2 PRBC >> 9.5. Pt was COVID-19 positive but had no pneumonia at the time.  See vitamin K for coagulopathy.  Plan was for outpatient EGD and possible colonoscopy.  Patient has had several months of poor appetite and periodic nausea and vomiting.  Also increased somnolence and confusion for several weeks.  In the last week he has had innumerable episodes of vomiting on a daily basis.  His mom is not aware of what the vomit looks like but she can hear him vomiting.  Unknown if he is having altered bowel habits or color of stools.  Mom saw a bottle of ibuprofen in his room which he has been taking for c/o leg pain.  It is unclear how much ibuprofen he has been taking.  He  sleeps most of the day.  She has not really been looking in on him in his room when she gets home from work.  He continues to drink but his mother is not aware of how much, but his friends drive him to the store in order to purchase EtOH.  Father also endorses weight loss of unclear quantity.  Weight in May 2021 was 63.1 kg, 70 kg in 07/2020, weight has not been rechecked during this admission.  Yesterday he developed seizures.    Resented to ED yesterday after multiple witnessed seizures at home.  Patient had not been  taking his seizure meds.  CT head: Decreased right frontal lobe attenuation concerning for recent/potentially acute infarct.  Agitation prevented successful MRI. Ammonia level 256 >> 126.  Sodium 125.  A1c 10.2/mean plasma glucose of 246. T bili 6.8.  Alk phos 162.  AST/ALT 107/24.   Hgb 7.6 >> 6.7 >> 1 PRBC ...  Platelets 69.  INR 1.5.  Dilantin level less than 2.5 (subtherapeutic)  Overnight had "bloody stool" per night shift RN.  No fecal submitted to lab.  Hospitalist initiated meds include Protonix 40 IV TID, octreotide, Rocephin.  Has not been able to take the oral lactulose due to AMS.  Remains obtunded on the floor.  EEG tech in room placing leads for the study.  Patient has had previous DUIs.  Lives with his mother.  No known family history of alcoholism, liver disease, colorectal cancers, ulcer disease.  Past Medical History:  Diagnosis Date  . 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 Seizures ([place])     Past Surgical History:  Procedure Laterality Date  . left kidney removed      Prior to Admission medications   Medication Sig Start Date End Date Taking? Authorizing Provider  levETIRAcetam (KEPPRA) 500 MG tablet Take 1 tablet (500 mg total) by mouth 2 (two) times daily. 07/10/20 10/08/20 Yes Dahal, Marlowe Aschoff, MD  pantoprazole (PROTONIX) 40 MG tablet Take 1 tablet (40 mg total) by mouth daily. 07/10/20 10/08/20 Yes Dahal, Marlowe Aschoff, MD  phenytoin (DILANTIN) 100 MG ER capsule Take 3 capsules (300 mg total) by mouth daily. 07/10/20 10/08/20 Yes Dahal, Marlowe Aschoff, MD  vitamin k 100 MCG tablet Take 1 tablet (100 mcg total) by mouth daily. For 60 days per patient 07/10/20 10/08/20 Yes Dahal, Marlowe Aschoff, MD    Scheduled Meds: . sodium chloride   Intravenous Once  . folic acid  1 mg Oral Daily  . insulin aspart  0-9 Units Subcutaneous Q4H  . lactulose  20 g Oral TID  . multivitamin with minerals  1 tablet Oral Daily  . pantoprazole (PROTONIX) IV  40 mg Intravenous Q12H  . phosphorus  500  mg Oral QID  . [START ON 09/26/2020] thiamine  100 mg Oral Daily   Infusions: . cefTRIAXone (ROCEPHIN)  IV 2 g (09/18/20 3888)  . lactated ringers    . levETIRAcetam 500 mg (09/18/20 0444)  . octreotide  (SANDOSTATIN)    IV infusion 50 mcg/hr (09/18/20 0710)  . potassium chloride 10 mEq (09/18/20 0659)  . thiamine injection     Followed by  . [START ON 09/21/2020] thiamine injection     PRN Meds: LORazepam **OR** LORazepam, LORazepam, ondansetron **OR** ondansetron (ZOFRAN) IV   Allergies as of 09/17/2020 - Review Complete 09/17/2020  Allergen Reaction Noted  . Iohexol Other (See Comments) 05/21/2016    Family History  Problem Relation Age of Onset  . Diabetes  Mellitus II Mother   . Colon cancer Neg Hx   . Esophageal cancer Neg Hx     Social History   Socioeconomic History  . Marital status: Single    Spouse name: Not on file  . Number of children: Not on file  . Years of education: Not on file  . Highest education level: Not on file  Occupational History  . Not on file  Tobacco Use  . Smoking status: Current Every Day Smoker    Packs/day: 2.00    Years: 25.00    Pack years: 50.00  . Smokeless tobacco: Never Used  Vaping Use  . Vaping Use: Never used  Substance and Sexual Activity  . Alcohol use: Not Currently    Alcohol/week: 3.0 standard drinks    Types: 3 Cans of beer per week  . Drug use: Not Currently    Types: Marijuana  . Sexual activity: Not on file  Other Topics Concern  . Not on file  Social History Narrative  . Not on file   Social Determinants of Health   Financial Resource Strain: Not on file  Food Insecurity: Not on file  Transportation Needs: Not on file  Physical Activity: Not on file  Stress: Not on file  Social Connections: Not on file  Intimate Partner Violence: Not on file    REVIEW OF SYSTEMS: Unable to obtain review of systems from the patient.  Any pertinent details are described in the HPI.   PHYSICAL EXAM: Vital signs  in last 24 hours: Vitals:   09/18/20 0836 09/18/20 0902  BP: 112/78 98/70  Pulse: 99 92  Resp: 20 20  Temp: 98.1 F (36.7 C) 98.2 F (36.8 C)  SpO2: 100% 100%   Wt Readings from Last 3 Encounters:  07/08/20 70 kg  01/12/20 69.4 kg  12/31/19 69.5 kg    General: Obtunded.  Not a lot of response to my physical exam but does move his limbs.  Head: No signs of head trauma. Eyes: No conjunctival pallor.  No scleral icterus Ears: Unable to assess hearing Nose: No congestion or discharge Mouth: Oropharynx moist, pink, clear. Neck: No JVD, no masses, no thyromegaly Lungs: No labored breathing.  Reduced breath sounds with poor inspiratory effort. Heart: RRR.  No MRG.  S1, S2 present Abdomen: Soft, nondistended, nontender.  No HSM, masses, bruits, hernias..   Rectal: Scant light to medium brown mucoid specimen.  No blood.  No melena Musc/Skeltl: General sarcopenia of the limbs. Extremities: No edema. Neurologic: Obtunded unresponsive to my exam.  Did not perform noxious stimuli in attempt to awaken patient.  Did move his limbs.  No tremors or seizure activity observed. Skin: No rash, no sores, no wounds on incomplete exam   Intake/Output from previous day: 03/13 0701 - 03/14 0700 In: -  Out: 200 [Urine:200] Intake/Output this shift: No intake/output data recorded.  LAB RESULTS: Recent Labs    09/17/20 1545 09/17/20 1606 09/18/20 0217  WBC 7.3  --  7.6  HGB 7.6* 9.2* 6.7*  HCT 23.8* 27.0* 19.2*  PLT 69*  --  69*   BMET Lab Results  Component Value Date   NA 130 (L) 09/18/2020   NA 127 (L) 09/17/2020   NA 125 (L) 09/17/2020   K 2.8 (L) 09/18/2020   K 2.7 (LL) 09/17/2020   K 2.7 (LL) 09/17/2020   CL 96 (L) 09/18/2020   CL 88 (L) 09/17/2020   CL 86 (L) 09/17/2020   CO2 21 (L) 09/18/2020  CO2 11 (L) 09/17/2020   CO2 20 (L) 07/09/2020   GLUCOSE 206 (H) 09/18/2020   GLUCOSE 257 (H) 09/17/2020   GLUCOSE 254 (H) 09/17/2020   BUN 5 (L) 09/18/2020   BUN 6 09/17/2020    BUN 6 09/17/2020   CREATININE 0.64 09/18/2020   CREATININE 0.40 (L) 09/17/2020   CREATININE 0.79 09/17/2020   CALCIUM 7.7 (L) 09/18/2020   CALCIUM 8.5 (L) 09/17/2020   CALCIUM 7.9 (L) 07/09/2020   LFT Recent Labs    09/17/20 1545 09/18/20 0217  PROT 7.7 6.7  ALBUMIN 3.0* 2.6*  AST 107* 87*  ALT 24 21  ALKPHOS 162* 152*  BILITOT 6.8* 6.5*   PT/INR Lab Results  Component Value Date   INR 1.5 (H) 09/17/2020   INR 1.7 (H) 07/09/2020   INR 1.3 (H) 01/12/2020   Hepatitis Panel No results for input(s): HEPBSAG, HCVAB, HEPAIGM, HEPBIGM in the last 72 hours. C-Diff No components found for: CDIFF Lipase     Component Value Date/Time   LIPASE 22 09/17/2020 1713    Drugs of Abuse     Component Value Date/Time   LABOPIA NONE DETECTED 09/17/2020 1550   COCAINSCRNUR NONE DETECTED 09/17/2020 1550   LABBENZ POSITIVE (A) 09/17/2020 1550   AMPHETMU NONE DETECTED 09/17/2020 1550   THCU NONE DETECTED 09/17/2020 1550   LABBARB NONE DETECTED 09/17/2020 1550     RADIOLOGY STUDIES: CT HEAD WO CONTRAST  Result Date: 09/17/2020 CLINICAL DATA:  Seizure EXAM: CT HEAD WITHOUT CONTRAST TECHNIQUE: Contiguous axial images were obtained from the base of the skull through the vertex without intravenous contrast. COMPARISON:  September 30, 2014 FINDINGS: Brain: Ventricles and sulci are normal in size and configuration. There is no intracranial mass, hemorrhage, extra-axial fluid collection, or midline shift. There is decreased attenuation in the mid right frontal lobe, a finding concerning for potential acute infarct in this area. Elsewhere brain parenchyma appears unremarkable. No evident acute infarct. Vascular: No hyperdense vessel. There is slight calcification in each carotid siphon region. Skull: Bony calvarium appears intact. Sinuses/Orbits: There is a retention cyst in the superior right maxillary antrum. Paranasal sinuses are clear. Orbits appear symmetric bilaterally. Other: Mastoid air cells  are clear. IMPRESSION: Decreased attenuation in the right frontal lobe midportion concerning for recent and potentially acute infarct. Elsewhere brain parenchyma appears unremarkable. No mass or hemorrhage appreciable. Mild arterial vascular calcification noted. Retention cyst in right maxillary antrum. 2325 Electronically Signed   By: Lowella Grip III M.D.   On: 09/17/2020 16:26   DG Chest Port 1 View  Result Date: 09/17/2020 CLINICAL DATA:  Seizure. EXAM: PORTABLE CHEST 1 VIEW COMPARISON:  July 09, 2020 FINDINGS: The cardiomediastinal silhouette is stable. No focal infiltrate. No pneumothorax. Healed left rib fracture. IMPRESSION: No active disease. Electronically Signed   By: Dorise Bullion III M.D   On: 09/17/2020 16:00     IMPRESSION:   *  Encephalopathy.   Multifactorial in patient with multiple seizures, possible CVA, elevated ammonia level.  *    Cirrhosis of the liver due to alcohol. Sequela of cirrhosis include Coagulopathy Thrombocytopenia Hyponatremia Changes of portal hypertension by previous imaging MELD 18, MELD-Na 27  *    Alcoholism, not in remission..  Discrim fx score 28 (no indication for Prednisolone).     *   Normocytic anemia.  Received 1 PRBC.  Awaiting follow-up Hgb.  Overnight RN reported "bloody stool".  No reported hematemesis or CGE.  Anemia panel labs pndg.    *  Acute seizure activity in patient with history of seizures.  This in setting of not taking antiseizure meds, subtherapeutic Dilantin level, alcohol abuse.  As he has been vomiting for many days, alcohol withdrawal seizures are strong possibility.    PLAN:     *  EGD once acute CNS issues resolve Continue Protonix IV.  Switch to lactulose enemas.  Not clear that Rocephin and octreotide are needed but will leave in place for now. Keep NPO to protect airway while he is obtunded.  Ok to place feeding tube if needed.   Await neuro eval of sizures, possible CVA Note order for Ultrasound to  assess for ascites (none evident on exam).     Azucena Freed  09/18/2020, 10:08 AM Phone [phone]  GI ATTENDING  History, laboratories, x-rays reviewed.  Agree with comprehensive consultation note as outlined above.  Patient with alcoholic liver disease admitted with obtundation.  History of seizure disorder.  Neurology evaluation underway.  On multiple supportive therapies, including treatment for hepatic encephalopathy.  Hyponatremic.  Agree with supportive care at this point.  Timing of EGD to be determined.  Patient is high risk.  Will follow.  Docia Chuck. Geri Seminole., M.D. [facility] Division of Gastroenterology

## 2020-09-18 NOTE — Procedures (Addendum)
Patient Name: Gary Mitchell  MRN: [id number]  Epilepsy Attending: Lora Havens  Referring Physician/Provider: Clance Boll, NP Date: 09/18/2020 Duration: 25.45 mins  Patient history: 50 year old male with history of epilepsy presented with altered mental status.  EEG to evaluate for seizures.  Level of alertness: Awake, asleep  AEDs during EEG study: Keppra  Technical aspects: This EEG study was done with scalp electrodes positioned according to the 10-20 International system of electrode placement. Electrical activity was acquired at a sampling rate of 500Hz  and reviewed with a high frequency filter of 70Hz  and a low frequency filter of 1Hz . EEG data were recorded continuously and digitally stored.   Description: The posterior dominant rhythm consists of 8-9 Hz activity of moderate voltage (25-35 uV) seen predominantly in posterior head regions, symmetric and reactive to eye opening and eye closing. Sleep was characterized by vertex waves, sleep spindles (12 to 14 Hz), maximal frontocentral region.  EEG showed continuous generalized 3 to 6 Hz theta-delta slowing.  Hyperventilation and photic stimulation were not performed.     ABNORMALITY -Continuous slow, generalized  IMPRESSION: This study is suggestive of mild to moderate diffuse encephalopathy, nonspecific etiology. No seizures or epileptiform discharges were seen throughout the recording.  Laityn Bensen Barbra Sarks

## 2020-09-18 NOTE — Progress Notes (Signed)
EEG complete - results pending 

## 2020-09-18 NOTE — Plan of Care (Signed)
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
## 2020-09-18 NOTE — Progress Notes (Signed)
Patient arrived in the unit at 2330 pm from [facility], escorted by RN in a stretcher, pt is lethargic, drowsy opened eyes to voice, pt is situated comfortably in the bed, initiated telemetry monitor, all necessary seizure precautions are in placed, bed is locked in low position, and will continue to monitor closely.

## 2020-09-19 DIAGNOSIS — D696 Thrombocytopenia, unspecified: Secondary | ICD-10-CM

## 2020-09-19 DIAGNOSIS — K7200 Acute and subacute hepatic failure without coma: Secondary | ICD-10-CM | POA: Diagnosis not present

## 2020-09-19 DIAGNOSIS — D62 Acute posthemorrhagic anemia: Secondary | ICD-10-CM

## 2020-09-19 DIAGNOSIS — K7030 Alcoholic cirrhosis of liver without ascites: Secondary | ICD-10-CM | POA: Diagnosis not present

## 2020-09-19 DIAGNOSIS — F1010 Alcohol abuse, uncomplicated: Secondary | ICD-10-CM | POA: Diagnosis not present

## 2020-09-19 LAB — COMPREHENSIVE METABOLIC PANEL
ALT: 18 U/L (ref 0–44)
AST: 62 U/L — ABNORMAL HIGH (ref 15–41)
Albumin: 2.6 g/dL — ABNORMAL LOW (ref 3.5–5.0)
Alkaline Phosphatase: 151 U/L — ABNORMAL HIGH (ref 38–126)
Anion gap: 12 (ref 5–15)
BUN: <5 mg/dL — ABNORMAL LOW (ref 6–20)
CO2: 21 mmol/L — ABNORMAL LOW (ref 22–32)
Calcium: 7.5 mg/dL — ABNORMAL LOW (ref 8.9–10.3)
Chloride: 98 mmol/L (ref 98–111)
Creatinine, Ser: 0.62 mg/dL (ref 0.61–1.24)
GFR, Estimated: >60 mL/min (ref >60)
Glucose, Bld: 101 mg/dL — ABNORMAL HIGH (ref 70–99)
Potassium: 3 mmol/L — ABNORMAL LOW (ref 3.5–5.1)
Sodium: 131 mmol/L — ABNORMAL LOW (ref 135–145)
Total Bilirubin: 5.9 mg/dL — ABNORMAL HIGH (ref 0.3–1.2)
Total Protein: 6.9 g/dL (ref 6.5–8.1)

## 2020-09-19 LAB — CBC WITH DIFFERENTIAL/PLATELET
Abs Immature Granulocytes: 0.03 10*3/uL (ref 0.00–0.07)
Basophils Absolute: 0.1 10*3/uL (ref 0.0–0.1)
Basophils Relative: 1 %
Eosinophils Absolute: 0.1 10*3/uL (ref 0.0–0.5)
Eosinophils Relative: 2 %
HCT: 24.6 % — ABNORMAL LOW (ref 39.0–52.0)
Hemoglobin: 8.5 g/dL — ABNORMAL LOW (ref 13.0–17.0)
Immature Granulocytes: 0 %
Lymphocytes Relative: 27 %
Lymphs Abs: 2.1 10*3/uL (ref 0.7–4.0)
MCH: 27.8 pg (ref 26.0–34.0)
MCHC: 34.6 g/dL (ref 30.0–36.0)
MCV: 80.4 fL (ref 80.0–100.0)
Monocytes Absolute: 1.1 10*3/uL — ABNORMAL HIGH (ref 0.1–1.0)
Monocytes Relative: 13 %
Neutro Abs: 4.4 10*3/uL (ref 1.7–7.7)
Neutrophils Relative %: 57 %
Platelets: 71 10*3/uL — ABNORMAL LOW (ref 150–400)
RBC: 3.06 MIL/uL — ABNORMAL LOW (ref 4.22–5.81)
RDW: 20.4 % — ABNORMAL HIGH (ref 11.5–15.5)
WBC: 7.9 10*3/uL (ref 4.0–10.5)
nRBC: 0 % (ref 0.0–0.2)

## 2020-09-19 LAB — GLUCOSE, CAPILLARY
Glucose-Capillary: 102 mg/dL — ABNORMAL HIGH (ref 70–99)
Glucose-Capillary: 160 mg/dL — ABNORMAL HIGH (ref 70–99)
Glucose-Capillary: 170 mg/dL — ABNORMAL HIGH (ref 70–99)
Glucose-Capillary: 210 mg/dL — ABNORMAL HIGH (ref 70–99)
Glucose-Capillary: 218 mg/dL — ABNORMAL HIGH (ref 70–99)
Glucose-Capillary: 236 mg/dL — ABNORMAL HIGH (ref 70–99)

## 2020-09-19 LAB — TSH: TSH: 0.355 u[IU]/mL (ref 0.350–4.500)

## 2020-09-19 LAB — TYPE AND SCREEN
ABO/RH(D): O POS
Antibody Screen: NEGATIVE
Unit division: 0

## 2020-09-19 LAB — MAGNESIUM: Magnesium: 1.2 mg/dL — ABNORMAL LOW (ref 1.7–2.4)

## 2020-09-19 LAB — BPAM RBC
Blood Product Expiration Date: 202204132359
ISSUE DATE / TIME: 202203140825
Unit Type and Rh: 5100

## 2020-09-19 LAB — PHOSPHORUS: Phosphorus: 2.1 mg/dL — ABNORMAL LOW (ref 2.5–4.6)

## 2020-09-19 MED ORDER — MAGNESIUM SULFATE 4 GM/100ML IV SOLN
4.0000 g | Freq: Once | INTRAVENOUS | Status: AC
  Administered 2020-09-19: 4 g via INTRAVENOUS
  Filled 2020-09-19: qty 100

## 2020-09-19 MED ORDER — PANTOPRAZOLE SODIUM 40 MG PO TBEC
40.0000 mg | DELAYED_RELEASE_TABLET | Freq: Every day | ORAL | Status: DC
  Administered 2020-09-20 – 2020-09-21 (×2): 40 mg via ORAL
  Filled 2020-09-19 (×2): qty 1

## 2020-09-19 MED ORDER — POTASSIUM CHLORIDE CRYS ER 20 MEQ PO TBCR
40.0000 meq | EXTENDED_RELEASE_TABLET | ORAL | Status: AC
  Administered 2020-09-19 (×2): 40 meq via ORAL
  Filled 2020-09-19 (×2): qty 2

## 2020-09-19 MED ORDER — ACETAMINOPHEN 325 MG PO TABS
325.0000 mg | ORAL_TABLET | Freq: Four times a day (QID) | ORAL | Status: DC | PRN
  Administered 2020-09-19 (×2): 325 mg via ORAL
  Filled 2020-09-19 (×2): qty 1

## 2020-09-19 NOTE — Progress Notes (Deleted)
Transfer from [place] due to left sided weakness. All CT and lab results are in pt chart. He had a negative COVID test result which is also in his chart.  Denies dizziness, shortness of breath, chest pain. Speech clear . See flowsheet for full shift assessment. Continuing to monitor.  Dr Tonie Griffith came to bedside to assess patient.

## 2020-09-19 NOTE — Plan of Care (Signed)

## 2020-09-19 NOTE — Progress Notes (Signed)
PROGRESS NOTE    Gary Mitchell  ONG:[id number] DOB: 1971/05/31 DOA: 09/17/2020 PCP: Charlott Rakes, MD   Chief Complaint  Patient presents with  . Seizures   Brief Narrative:  Gary Mitchell is Gary Mitchell 50 y.o. male with medical history significant of alcohol abuse, alcoholic liver disease, seizure disorder on Keppra, recent COVID-19 pneumonia in January, GERD, anemia of chronic disease who presented with multiple seizures, hepatic encephalopathy, and acute blood loss anemia.   Assessment & Plan:   Principal Problem:   Seizure ([place]) Active Problems:   Alcohol abuse   Thrombocytopenia ([place])   COVID-19 virus infection   GERD (gastroesophageal reflux disease)   Alcoholic cirrhosis of liver without ascites (H[facility])   Acute hepatic encephalopathy   Hypomagnesemia   Suspected CVA (cerebral vascular accident) ([place])   Hypokalemia   Acute blood loss anemia  #1 multiple seizures: Multiple seizures reported prior to admission.  Hx seizure disorder not taking meds as well as hx etoh abuse, possible withdrawals.   S/p keppra load.   Continue keppra 500 mg q12 IV -> neuro recommending keppra 500 mg BID at discharge with outpatient neuro follow up in 2-4 weeks Seizure precautions Do not give ativan for Gary Mitchell seizure that does not last longer than 3 min (per neurology) Neurology c/s, appreciate recs  # Hepatic Encephalopathy  Alcoholic Cirrhosis Lactulose PO or PR Ammonia 256 on presentation, improving  [place] without ascities 3/14 - Bili 6.5, platelets 69, INR 1.4, albumin 2.6 3/14 - maddrey's discriminant function 14.3 GI consult, appreciate recs  # Concern for Acute Blood Loss Anemia  Concern for GI bleed In setting of iron def anemia and cirrhosis, concerning for GI bleed PPI BID, octreotide, ceftriaxone GI c/s, appreciate recs - planning to follow for EGD when able - possibly tomorrow, 3/16, per GI Transfuse and follow   # Acute Metabolic Encephalopathy Multiple reasons, hepatic  encephalopathy, etoh abuse/possible withdrawal High dose thiamine B12 wnl, folate wnl.  Follow TSH. Delirium precautions.  Head CT at presentation concerning for possible acute infarct -> MRI brain 3/14 motion degraded and abbreviated - no acute or reversible finding  #3 hypokalemia  Hypomagnesemia  Hypophosphatemia: replace and follow   #5 GERD: Continue with PPIs  #6 recent COVID-19 infection: doesn't need isolation.  Infection Jan 2022.  DVT prophylaxis: SCD Code Status: full  Family Communication:mother at bedside3 Disposition:   Status is: Inpatient  Remains inpatient appropriate because:Inpatient level of care appropriate due to severity of illness   Dispo: The patient is from: Home              Anticipated d/c is to: pending              Patient currently is not medically stable to d/c.   Difficult to place patient No       Consultants:   GI  neurology  Procedures:  EEG  IMPRESSION: This study is suggestive of mild to moderate diffuse encephalopathy, nonspecific etiology. No seizures or epileptiform discharges were seen throughout the recording.  Antimicrobials:  Anti-infectives (From admission, onward)   Start     Dose/Rate Route Frequency Ordered Stop   09/18/20 0530  cefTRIAXone (ROCEPHIN) 2 g in sodium chloride 0.9 % 100 mL IVPB        2 g 200 mL/hr over 30 Minutes Intravenous Every 24 hours 09/18/20 0442 09/25/20 0529      Subjective: Less confused today.  Gary Mitchell&O.  No new complaints.  Objective: Vitals:   09/18/20 2317 09/19/20  0306 09/19/20 0723 09/19/20 1214  BP: 129/90 (!) 139/101 128/90 123/89  Pulse: (!) 106 (!) 104 (!) 106 98  Resp: 18 18 16 16   Temp: 98.4 F (36.9 C) 98.4 F (36.9 C) 98 F (36.7 C) 98.3 F (36.8 C)  TempSrc: Oral Oral Oral Oral  SpO2: 100% 100% 100% 100%    Intake/Output Summary (Last 24 hours) at 09/19/2020 1836 Last data filed at 09/19/2020 1729 Gross per 24 hour  Intake 3605.07 ml  Output 2600 ml  Net  1005.07 ml   There were no vitals filed for this visit.  Examination:  General: No acute distress. Cardiovascular: Heart sounds show Gary Mitchell regular rate, and rhythm. Lungs: Clear to auscultation bilaterally  Abdomen: Soft, nontender, nondistended  Neurological: Alert and oriented 3. Moves all extremities 4 . Cranial nerves II through XII grossly intact. Skin: Warm and dry. No rashes or lesions. Extremities: No clubbing or cyanosis. No edema.   Data Reviewed: I have personally reviewed following labs and imaging studies  CBC: Recent Labs  Lab 09/17/20 1545 09/17/20 1606 09/18/20 0217 09/18/20 1919 09/19/20 0305  WBC 7.3  --  7.6  --  7.9  NEUTROABS 4.9  --   --   --  4.4  HGB 7.6* 9.2* 6.7* 9.2* 8.5*  HCT 23.8* 27.0* 19.2* 27.9* 24.6*  MCV 84.1  --  78.4*  --  80.4  PLT 69*  --  69*  --  71*    Basic Metabolic Panel: Recent Labs  Lab 09/17/20 1545 09/17/20 1606 09/18/20 0217 09/19/20 0305  NA 125* 127* 130* 131*  K 2.7* 2.7* 2.8* 3.0*  CL 86* 88* 96* 98  CO2 11*  --  21* 21*  GLUCOSE 254* 257* 206* 101*  BUN 6 6 5* <5*  CREATININE 0.79 0.40* 0.64 0.62  CALCIUM 8.5*  --  7.7* 7.5*  MG 1.5*  --  1.6* 1.2*  PHOS  --   --  1.5* 2.1*    GFR: CrCl cannot be calculated (Unknown ideal weight.).  Liver Function Tests: Recent Labs  Lab 09/17/20 1545 09/18/20 0217 09/19/20 0305  AST 107* 87* 62*  ALT 24 21 18   ALKPHOS 162* 152* 151*  BILITOT 6.8* 6.5* 5.9*  PROT 7.7 6.7 6.9  ALBUMIN 3.0* 2.6* 2.6*    CBG: Recent Labs  Lab 09/18/20 2318 09/19/20 0307 09/19/20 0807 09/19/20 1216 09/19/20 1653  GLUCAP 151* 102* 170* 236* 218*     No results found for this or any previous visit (from the past 240 hour(s)).       Radiology Studies: MR BRAIN WO CONTRAST  Result Date: 09/18/2020 CLINICAL DATA:  Seizure. EXAM: MRI HEAD WITHOUT CONTRAST TECHNIQUE: Multiplanar, multiecho pulse sequences of the brain and surrounding structures were obtained without  intravenous contrast. COMPARISON:  Head CT yesterday. FINDINGS: Brain: The patient was uncooperative and would not tolerate complete imaging. There is Gary Mitchell great deal of motion degradation. These factors make accurate interpretation difficulty. There is some apparent bright signal on diffusion imaging affecting the brainstem and cerebral peduncles, but I think this is probably artifactual. There is scattered foci of abnormal T2 and FLAIR signal within the cerebral hemispheric white matter suggesting old small vessel disease. No evidence of mass lesion, hemorrhage, hydrocephalus or extra-axial collection. Mesial temporal lobes appear symmetric without definable lesion. Vascular: Major vessels at the base of the brain show flow. Skull and upper cervical spine: Negative Sinuses/Orbits: Clear except for Gary Mitchell right maxillary sinus retention cyst. Orbits negative. Other: None  IMPRESSION: Abbreviated and motion degraded exam. No acute or reversible finding. Mild chronic small-vessel ischemic change of the cerebral hemispheric white matter. No specific abnormality seen to explain seizure. Diffusion imaging shows some high signal in the brainstem and cerebral peduncles and lower radiating white matter tracts, but does not show true restricted diffusion on the ADC map and therefore I think the finding is artifactual. Electronically Signed   By: Nelson Chimes M.D.   On: 09/18/2020 18:26   EEG adult  Result Date: 09/18/2020 Lora Havens, MD     09/18/2020 11:42 AM Patient Name: Gary Mitchell MRN: [id number] Epilepsy Attending: Lora Havens Referring Physician/Provider: Clance Boll, NP Date: 09/18/2020 Duration: 25.45 mins Patient history: 50 year old male with history of epilepsy presented with altered mental status.  EEG to evaluate for seizures. Level of alertness: Awake, asleep AEDs during EEG study: Keppra Technical aspects: This EEG study was done with scalp electrodes positioned according to the 10-20  International system of electrode placement. Electrical activity was acquired at [place] sampling rate of 500Hz  and reviewed with Mcihael Hinderman high frequency filter of 70Hz  and Aleesia Henney low frequency filter of 1Hz . EEG data were recorded continuously and digitally stored. Description: The posterior dominant rhythm consists of 8-9 Hz activity of moderate voltage (25-35 uV) seen predominantly in posterior head regions, symmetric and reactive to eye opening and eye closing. Sleep was characterized by vertex waves, sleep spindles (12 to 14 Hz), maximal frontocentral region.  EEG showed continuous generalized 3 to 6 Hz theta-delta slowing.  Hyperventilation and photic stimulation were not performed.   ABNORMALITY -Continuous slow, generalized IMPRESSION: This study is suggestive of mild to moderate diffuse encephalopathy, nonspecific etiology. No seizures or epileptiform discharges were seen throughout the recording. Priyanka Barbra Sarks   [place] ASCITES (ABDOMEN LIMITED)  Result Date: 09/18/2020 CLINICAL DATA:  Cirrhosis.  Evaluate for ascites EXAM: LIMITED ABDOMEN ULTRASOUND FOR ASCITES TECHNIQUE: Limited ultrasound survey for ascites was performed in all four abdominal quadrants. COMPARISON:  None. FINDINGS: Evaluation of the 4 quadrants of the abdomen demonstrates no measurable ascites. IMPRESSION: No ascites. Electronically Signed   By: Suzy Bouchard M.D.   On: 09/18/2020 16:16        Scheduled Meds: . sodium chloride   Intravenous Once  . folic acid  1 mg Oral Daily  . insulin aspart  0-9 Units Subcutaneous Q4H  . lactulose  30 g Oral TID  . multivitamin with minerals  1 tablet Oral Daily  . [START ON 09/20/2020] pantoprazole  40 mg Oral Q0600  . phosphorus  500 mg Oral QID  . sodium chloride flush  10-40 mL Intracatheter Q12H  . [START ON 09/26/2020] thiamine  100 mg Oral Daily   Continuous Infusions: . cefTRIAXone (ROCEPHIN)  IV 2 g (09/19/20 0454)  . levETIRAcetam Stopped (09/19/20 1055)  . octreotide  (SANDOSTATIN)     IV infusion 50 mcg/hr (09/19/20 1729)  . thiamine injection 500 mg (09/19/20 1729)   Followed by  . [START ON 09/21/2020] thiamine injection       LOS: 2 days    Time spent: over 37 min    Fayrene Helper, MD [facility]   To contact the attending provider between 7A-7P or the covering provider during after hours 7P-7A, please log into the web site [email or web address] and access using universal [place] password for that web site. If you do not have the password, please call the hospital operator.  09/19/2020, 6:36 PM

## 2020-09-19 NOTE — Progress Notes (Addendum)
Daily Rounding Note  09/19/2020, 10:00 AM  LOS: 2 days   SUBJECTIVE:   Chief complaint: Reported bleeding per rectum.     Last observed episode of bleeding was overnight of 3/13-3/14.  No further bleeding.  Did have a brown stool this morning.  No nausea.  No abdominal pain.  No recurrent seizures. Heart rate as high as the low 100s.  BPs 120s-130s/90s-101.  Sats 100% on 2 L nasal cannula oxygen. More alert today.  Able to hold a conversation. C/o pain in the upper right mouth when he swallows lactulose, he thinks it is his tooth SLP does not feel formal swallow evaluation warranted and is okay for full PO. Pt is hungry.    OBJECTIVE:         Vital signs in last 24 hours:    Temp:  [97.5 F (36.4 C)-98.4 F (36.9 C)] 98 F (36.7 C) (03/15 0723) Pulse Rate:  [98-106] 106 (03/15 0723) Resp:  [16-18] 16 (03/15 0723) BP: (124-143)/(73-101) 128/90 (03/15 0723) SpO2:  [98 %-100 %] 100 % (03/15 0723) Last BM Date: 09/18/20 There were no vitals filed for this visit. General: Alert, able to answer questions.  Looks old for age and chronically ill but not acutely ill. Mouth: Teeth are actually in good repair.  No signs of dental caries in the upper right jaw.  However the lateral right aspect of his tongue has a laceration/skin flap which is not bleeding. Heart: RRR.  Heart rate in the 80s on telemetry monitor. Chest: Clear bilaterally.  No labored breathing or cough.  Overall reduced breath sounds bilaterally. Abdomen: Soft, nontender, nondistended.  Bowel sounds active Extremities: No CCE. Neuro/Psych: Oriented to place, time, hospital.  Answers questions appropriately.  Moves all 4 limbs.  No tremors/shaking observed.  Intake/Output from previous day: 03/14 0701 - 03/15 0700 In: 2712.2 [P.O.:360; I.V.:1775.9; IV Piggyback:576.3] Out: 3400 [Urine:3400]  Intake/Output this shift: No intake/output data recorded.  Lab  Results: Recent Labs    09/17/20 1545 09/17/20 1606 09/18/20 0217 09/18/20 1919 09/19/20 0305  WBC 7.3  --  7.6  --  7.9  HGB 7.6*   < > 6.7* 9.2* 8.5*  HCT 23.8*   < > 19.2* 27.9* 24.6*  PLT 69*  --  69*  --  71*   < > = values in this interval not displayed.   BMET Recent Labs    09/17/20 1545 09/17/20 1606 09/18/20 0217 09/19/20 0305  NA 125* 127* 130* 131*  K 2.7* 2.7* 2.8* 3.0*  CL 86* 88* 96* 98  CO2 11*  --  21* 21*  GLUCOSE 254* 257* 206* 101*  BUN 6 6 5* <5*  CREATININE 0.79 0.40* 0.64 0.62  CALCIUM 8.5*  --  7.7* 7.5*   LFT Recent Labs    09/17/20 1545 09/18/20 0217 09/19/20 0305  PROT 7.7 6.7 6.9  ALBUMIN 3.0* 2.6* 2.6*  AST 107* 87* 62*  ALT 24 21 18   ALKPHOS 162* 152* 151*  BILITOT 6.8* 6.5* 5.9*   PT/INR Recent Labs    09/17/20 1545 09/18/20 1334  LABPROT 17.5* 16.9*  INR 1.5* 1.4*   Hepatitis Panel No results for input(s): HEPBSAG, HCVAB, HEPAIGM, HEPBIGM in the last 72 hours.  Studies/Results: CT HEAD WO CONTRAST  Result Date: 09/17/2020 CLINICAL DATA:  Seizure EXAM: CT HEAD WITHOUT CONTRAST TECHNIQUE: Contiguous axial images were obtained from the base of the skull through the vertex without intravenous contrast. COMPARISON:  September 30, 2014 FINDINGS: Brain: Ventricles and sulci are normal in size and configuration. There is no intracranial mass, hemorrhage, extra-axial fluid collection, or midline shift. There is decreased attenuation in the mid right frontal lobe, a finding concerning for potential acute infarct in this area. Elsewhere brain parenchyma appears unremarkable. No evident acute infarct. Vascular: No hyperdense vessel. There is slight calcification in each carotid siphon region. Skull: Bony calvarium appears intact. Sinuses/Orbits: There is a retention cyst in the superior right maxillary antrum. Paranasal sinuses are clear. Orbits appear symmetric bilaterally. Other: Mastoid air cells are clear. IMPRESSION: Decreased attenuation  in the right frontal lobe midportion concerning for recent and potentially acute infarct. Elsewhere brain parenchyma appears unremarkable. No mass or hemorrhage appreciable. Mild arterial vascular calcification noted. Retention cyst in right maxillary antrum. 2325 Electronically Signed   By: Lowella Grip III M.D.   On: 09/17/2020 16:26   MR BRAIN WO CONTRAST  Result Date: 09/18/2020 CLINICAL DATA:  Seizure. EXAM: MRI HEAD WITHOUT CONTRAST TECHNIQUE: Multiplanar, multiecho pulse sequences of the brain and surrounding structures were obtained without intravenous contrast. COMPARISON:  Head CT yesterday. FINDINGS: Brain: The patient was uncooperative and would not tolerate complete imaging. There is a great deal of motion degradation. These factors make accurate interpretation difficulty. There is some apparent bright signal on diffusion imaging affecting the brainstem and cerebral peduncles, but I think this is probably artifactual. There is scattered foci of abnormal T2 and FLAIR signal within the cerebral hemispheric white matter suggesting old small vessel disease. No evidence of mass lesion, hemorrhage, hydrocephalus or extra-axial collection. Mesial temporal lobes appear symmetric without definable lesion. Vascular: Major vessels at the base of the brain show flow. Skull and upper cervical spine: Negative Sinuses/Orbits: Clear except for a right maxillary sinus retention cyst. Orbits negative. Other: None IMPRESSION: Abbreviated and motion degraded exam. No acute or reversible finding. Mild chronic small-vessel ischemic change of the cerebral hemispheric white matter. No specific abnormality seen to explain seizure. Diffusion imaging shows some high signal in the brainstem and cerebral peduncles and lower radiating white matter tracts, but does not show true restricted diffusion on the ADC map and therefore I think the finding is artifactual. Electronically Signed   By: Nelson Chimes M.D.   On: 09/18/2020  18:26   DG Chest Port 1 View  Result Date: 09/17/2020 CLINICAL DATA:  Seizure. EXAM: PORTABLE CHEST 1 VIEW COMPARISON:  July 09, 2020 FINDINGS: The cardiomediastinal silhouette is stable. No focal infiltrate. No pneumothorax. Healed left rib fracture. IMPRESSION: No active disease. Electronically Signed   By: Dorise Bullion III M.D   On: 09/17/2020 16:00   EEG adult  Result Date: 09/18/2020 Lora Havens, MD     09/18/2020 11:42 AM Patient Name: Gary Mitchell MRN: [id number] Epilepsy Attending: Lora Havens Referring Physician/Provider: Clance Boll, NP Date: 09/18/2020 Duration: 25.45 mins Patient history: 50 year old male with history of epilepsy presented with altered mental status.  EEG to evaluate for seizures. Level of alertness: Awake, asleep AEDs during EEG study: Keppra Technical aspects: This EEG study was done with scalp electrodes positioned according to the 10-20 International system of electrode placement. Electrical activity was acquired at a sampling rate of 500Hz  and reviewed with a high frequency filter of 70Hz  and a low frequency filter of 1Hz . EEG data were recorded continuously and digitally stored. Description: The posterior dominant rhythm consists of 8-9 Hz activity of moderate voltage (25-35 uV) seen predominantly in posterior head regions, symmetric and reactive to  eye opening and eye closing. Sleep was characterized by vertex waves, sleep spindles (12 to 14 Hz), maximal frontocentral region.  EEG showed continuous generalized 3 to 6 Hz theta-delta slowing.  Hyperventilation and photic stimulation were not performed.   ABNORMALITY -Continuous slow, generalized IMPRESSION: This study is suggestive of mild to moderate diffuse encephalopathy, nonspecific etiology. No seizures or epileptiform discharges were seen throughout the recording. Priyanka Barbra Sarks   [place] ASCITES (ABDOMEN LIMITED)  Result Date: 09/18/2020 CLINICAL DATA:  Cirrhosis.  Evaluate for ascites EXAM:  LIMITED ABDOMEN ULTRASOUND FOR ASCITES TECHNIQUE: Limited ultrasound survey for ascites was performed in all four abdominal quadrants. COMPARISON:  None. FINDINGS: Evaluation of the 4 quadrants of the abdomen demonstrates no measurable ascites. IMPRESSION: No ascites. Electronically Signed   By: Suzy Bouchard M.D.   On: 09/18/2020 16:16    Scheduled Meds: . sodium chloride   Intravenous Once  . folic acid  1 mg Oral Daily  . insulin aspart  0-9 Units Subcutaneous Q4H  . lactulose  30 g Oral TID  . multivitamin with minerals  1 tablet Oral Daily  . pantoprazole (PROTONIX) IV  40 mg Intravenous Q12H  . phosphorus  500 mg Oral QID  . potassium chloride  40 mEq Oral Q4H  . sodium chloride flush  10-40 mL Intracatheter Q12H  . [START ON 09/26/2020] thiamine  100 mg Oral Daily   Continuous Infusions: . cefTRIAXone (ROCEPHIN)  IV 2 g (09/19/20 0454)  . levETIRAcetam 500 mg (09/18/20 2115)  . magnesium sulfate bolus IVPB    . octreotide  (SANDOSTATIN)    IV infusion 50 mcg/hr (09/18/20 2109)  . thiamine injection 500 mg (09/19/20 0837)   Followed by  . 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   No ascites   *   Bloody (BRB vs burgundy?) stool 3/13 PM.  Brown stool on DRE of 3/15, not hemocculted. PPI bid, Octreotide gtt, rocephin (day 2/7) in place   *   Acute on chronic anemia.  Hgb 7.6 >> 6.7 >> 9.2 >> 8.5.   Predominantly microcytic.    *   Thrombocytopenia.      *    Coagulopathy.  INR 1.7 >> 1.4.     *    Hyponatremia.  Na 125 >> 131.    *   AMS in setting of active seizures (non-compiance w anti-epileptics), ?CVA, HE.   Lactulose tid in place.  Neuro following.     *   DM 2, poorly controlled.  A1c 10.2.     PLAN   *   Leave octreotide, Rocephin in place.   Switch from IV to po  Protonix.  *   EGD.  Timing per Dr Henrene Pastor.  ? Tmrw?  Discussed this possibility with the patient who is not enthusiastic about EGD but does consent to procedure.  *    Start soft/CM diet.   Azucena Freed  09/19/2020, 10:00 AM Phone [phone]  GI ATTENDING  Interval history data reviewed.  Patient seen and examined.  Issues with mental status improved.  Long-term prognosis remains poor due to medical noncompliance for multiple significant and advanced medical problems.  Since admission his hemoglobins have been stable and stools are brown.  I think it is reasonable to perform upper endoscopy to rule out significant  esophageal varices or other upper GI bleeding source-such as portal hypertensive gastropathy or peptic ulcer disease.  Working to set the examination tomorrow morning with Dr. Tarri Glenn.  Patient is HIGH risk given his comorbidities.  He will require monitored anesthesia care for his procedure related sedation.  Docia Chuck. Geri [place]in[place]., M.D. [facility] Division of Gastroenterology

## 2020-09-19 NOTE — H&P (View-Only) (Signed)
Daily Rounding Note  09/19/2020, 10:00 AM  LOS: 2 days   SUBJECTIVE:   Chief complaint: Reported bleeding per rectum.     Last observed episode of bleeding was overnight of 3/13-3/14.  No further bleeding.  Did have a brown stool this morning.  No nausea.  No abdominal pain.  No recurrent seizures. Heart rate as high as the low 100s.  BPs 120s-130s/90s-101.  Sats 100% on 2 L nasal cannula oxygen. More alert today.  Able to hold a conversation. C/o pain in the upper right mouth when he swallows lactulose, he thinks it is his tooth SLP does not feel formal swallow evaluation warranted and is okay for full PO. Pt is hungry.    OBJECTIVE:         Vital signs in last 24 hours:    Temp:  [97.5 F (36.4 C)-98.4 F (36.9 C)] 98 F (36.7 C) (03/15 0723) Pulse Rate:  [98-106] 106 (03/15 0723) Resp:  [16-18] 16 (03/15 0723) BP: (124-143)/(73-101) 128/90 (03/15 0723) SpO2:  [98 %-100 %] 100 % (03/15 0723) Last BM Date: 09/18/20 There were no vitals filed for this visit. General: Alert, able to answer questions.  Looks old for age and chronically ill but not acutely ill. Mouth: Teeth are actually in good repair.  No signs of dental caries in the upper right jaw.  However the lateral right aspect of his tongue has a laceration/skin flap which is not bleeding. Heart: RRR.  Heart rate in the 80s on telemetry monitor. Chest: Clear bilaterally.  No labored breathing or cough.  Overall reduced breath sounds bilaterally. Abdomen: Soft, nontender, nondistended.  Bowel sounds active Extremities: No CCE. Neuro/Psych: Oriented to place, time, hospital.  Answers questions appropriately.  Moves all 4 limbs.  No tremors/shaking observed.  Intake/Output from previous day: 03/14 0701 - 03/15 0700 In: 2712.2 [P.O.:360; I.V.:1775.9; IV Piggyback:576.3] Out: 3400 [Urine:3400]  Intake/Output this shift: No intake/output data recorded.  Lab  Results: Recent Labs    09/17/20 1545 09/17/20 1606 09/18/20 0217 09/18/20 1919 09/19/20 0305  WBC 7.3  --  7.6  --  7.9  HGB 7.6*   < > 6.7* 9.2* 8.5*  HCT 23.8*   < > 19.2* 27.9* 24.6*  PLT 69*  --  69*  --  71*   < > = values in this interval not displayed.   BMET Recent Labs    09/17/20 1545 09/17/20 1606 09/18/20 0217 09/19/20 0305  NA 125* 127* 130* 131*  K 2.7* 2.7* 2.8* 3.0*  CL 86* 88* 96* 98  CO2 11*  --  21* 21*  GLUCOSE 254* 257* 206* 101*  BUN 6 6 5* <5*  CREATININE 0.79 0.40* 0.64 0.62  CALCIUM 8.5*  --  7.7* 7.5*   LFT Recent Labs    09/17/20 1545 09/18/20 0217 09/19/20 0305  PROT 7.7 6.7 6.9  ALBUMIN 3.0* 2.6* 2.6*  AST 107* 87* 62*  ALT 24 21 18   ALKPHOS 162* 152* 151*  BILITOT 6.8* 6.5* 5.9*   PT/INR Recent Labs    09/17/20 1545 09/18/20 1334  LABPROT 17.5* 16.9*  INR 1.5* 1.4*   Hepatitis Panel No results for input(s): HEPBSAG, HCVAB, HEPAIGM, HEPBIGM in the last 72 hours.  Studies/Results: CT HEAD WO CONTRAST  Result Date: 09/17/2020 CLINICAL DATA:  Seizure EXAM: CT HEAD WITHOUT CONTRAST TECHNIQUE: Contiguous axial images were obtained from the base of the skull through the vertex without intravenous contrast. COMPARISON:  September 30, 2014 FINDINGS: Brain: Ventricles and sulci are normal in size and configuration. There is no intracranial mass, hemorrhage, extra-axial fluid collection, or midline shift. There is decreased attenuation in the mid right frontal lobe, a finding concerning for potential acute infarct in this area. Elsewhere brain parenchyma appears unremarkable. No evident acute infarct. Vascular: No hyperdense vessel. There is slight calcification in each carotid siphon region. Skull: Bony calvarium appears intact. Sinuses/Orbits: There is a retention cyst in the superior right maxillary antrum. Paranasal sinuses are clear. Orbits appear symmetric bilaterally. Other: Mastoid air cells are clear. IMPRESSION: Decreased attenuation  in the right frontal lobe midportion concerning for recent and potentially acute infarct. Elsewhere brain parenchyma appears unremarkable. No mass or hemorrhage appreciable. Mild arterial vascular calcification noted. Retention cyst in right maxillary antrum. 2325 Electronically Signed   By: Lowella Grip III M.D.   On: 09/17/2020 16:26   MR BRAIN WO CONTRAST  Result Date: 09/18/2020 CLINICAL DATA:  Seizure. EXAM: MRI HEAD WITHOUT CONTRAST TECHNIQUE: Multiplanar, multiecho pulse sequences of the brain and surrounding structures were obtained without intravenous contrast. COMPARISON:  Head CT yesterday. FINDINGS: Brain: The patient was uncooperative and would not tolerate complete imaging. There is a great deal of motion degradation. These factors make accurate interpretation difficulty. There is some apparent bright signal on diffusion imaging affecting the brainstem and cerebral peduncles, but I think this is probably artifactual. There is scattered foci of abnormal T2 and FLAIR signal within the cerebral hemispheric white matter suggesting old small vessel disease. No evidence of mass lesion, hemorrhage, hydrocephalus or extra-axial collection. Mesial temporal lobes appear symmetric without definable lesion. Vascular: Major vessels at the base of the brain show flow. Skull and upper cervical spine: Negative Sinuses/Orbits: Clear except for a right maxillary sinus retention cyst. Orbits negative. Other: None IMPRESSION: Abbreviated and motion degraded exam. No acute or reversible finding. Mild chronic small-vessel ischemic change of the cerebral hemispheric white matter. No specific abnormality seen to explain seizure. Diffusion imaging shows some high signal in the brainstem and cerebral peduncles and lower radiating white matter tracts, but does not show true restricted diffusion on the ADC map and therefore I think the finding is artifactual. Electronically Signed   By: Nelson Chimes M.D.   On: 09/18/2020  18:26   DG Chest Port 1 View  Result Date: 09/17/2020 CLINICAL DATA:  Seizure. EXAM: PORTABLE CHEST 1 VIEW COMPARISON:  July 09, 2020 FINDINGS: The cardiomediastinal silhouette is stable. No focal infiltrate. No pneumothorax. Healed left rib fracture. IMPRESSION: No active disease. Electronically Signed   By: Dorise Bullion III M.D   On: 09/17/2020 16:00   EEG adult  Result Date: 09/18/2020 Lora Havens, MD     09/18/2020 11:42 AM Patient Name: Gary Mitchell MRN: [id number] Epilepsy Attending: Lora Havens Referring Physician/Provider: Clance Boll, NP Date: 09/18/2020 Duration: 25.45 mins Patient history: 50 year old male with history of epilepsy presented with altered mental status.  EEG to evaluate for seizures. Level of alertness: Awake, asleep AEDs during EEG study: Keppra Technical aspects: This EEG study was done with scalp electrodes positioned according to the 10-20 International system of electrode placement. Electrical activity was acquired at a sampling rate of 500Hz  and reviewed with a high frequency filter of 70Hz  and a low frequency filter of 1Hz . EEG data were recorded continuously and digitally stored. Description: The posterior dominant rhythm consists of 8-9 Hz activity of moderate voltage (25-35 uV) seen predominantly in posterior head regions, symmetric and reactive to  eye opening and eye closing. Sleep was characterized by vertex waves, sleep spindles (12 to 14 Hz), maximal frontocentral region.  EEG showed continuous generalized 3 to 6 Hz theta-delta slowing.  Hyperventilation and photic stimulation were not performed.   ABNORMALITY -Continuous slow, generalized IMPRESSION: This study is suggestive of mild to moderate diffuse encephalopathy, nonspecific etiology. No seizures or epileptiform discharges were seen throughout the recording. Priyanka Barbra Sarks   [place] ASCITES (ABDOMEN LIMITED)  Result Date: 09/18/2020 CLINICAL DATA:  Cirrhosis.  Evaluate for ascites EXAM:  LIMITED ABDOMEN ULTRASOUND FOR ASCITES TECHNIQUE: Limited ultrasound survey for ascites was performed in all four abdominal quadrants. COMPARISON:  None. FINDINGS: Evaluation of the 4 quadrants of the abdomen demonstrates no measurable ascites. IMPRESSION: No ascites. Electronically Signed   By: Suzy Bouchard M.D.   On: 09/18/2020 16:16    Scheduled Meds: . sodium chloride   Intravenous Once  . folic acid  1 mg Oral Daily  . insulin aspart  0-9 Units Subcutaneous Q4H  . lactulose  30 g Oral TID  . multivitamin with minerals  1 tablet Oral Daily  . pantoprazole (PROTONIX) IV  40 mg Intravenous Q12H  . phosphorus  500 mg Oral QID  . potassium chloride  40 mEq Oral Q4H  . sodium chloride flush  10-40 mL Intracatheter Q12H  . [START ON 09/26/2020] thiamine  100 mg Oral Daily   Continuous Infusions: . cefTRIAXone (ROCEPHIN)  IV 2 g (09/19/20 0454)  . levETIRAcetam 500 mg (09/18/20 2115)  . magnesium sulfate bolus IVPB    . octreotide  (SANDOSTATIN)    IV infusion 50 mcg/hr (09/18/20 2109)  . thiamine injection 500 mg (09/19/20 0837)   Followed by  . 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   No ascites   *   Bloody (BRB vs burgundy?) stool 3/13 PM.  Brown stool on DRE of 3/15, not hemocculted. PPI bid, Octreotide gtt, rocephin (day 2/7) in place   *   Acute on chronic anemia.  Hgb 7.6 >> 6.7 >> 9.2 >> 8.5.   Predominantly microcytic.    *   Thrombocytopenia.      *    Coagulopathy.  INR 1.7 >> 1.4.     *    Hyponatremia.  Na 125 >> 131.    *   AMS in setting of active seizures (non-compiance w anti-epileptics), ?CVA, HE.   Lactulose tid in place.  Neuro following.     *   DM 2, poorly controlled.  A1c 10.2.     PLAN   *   Leave octreotide, Rocephin in place.   Switch from IV to po  Protonix.  *   EGD.  Timing per Dr Henrene Pastor.  ? Tmrw?  Discussed this possibility with the patient who is not enthusiastic about EGD but does consent to procedure.  *    Start soft/CM diet.   Azucena Freed  09/19/2020, 10:00 AM Phone [phone]  GI ATTENDING  Interval history data reviewed.  Patient seen and examined.  Issues with mental status improved.  Long-term prognosis remains poor due to medical noncompliance for multiple significant and advanced medical problems.  Since admission his hemoglobins have been stable and stools are brown.  I think it is reasonable to perform upper endoscopy to rule out significant  esophageal varices or other upper GI bleeding source-such as portal hypertensive gastropathy or peptic ulcer disease.  Working to set the examination tomorrow morning with Dr. Tarri Glenn.  Patient is HIGH risk given his comorbidities.  He will require monitored anesthesia care for his procedure related sedation.  Docia Chuck. Geri Seminole., M.D. [facility] Division of Gastroenterology

## 2020-09-19 NOTE — Progress Notes (Signed)
Inpatient Diabetes Program Recommendations  AACE/ADA: New Consensus Statement on Inpatient Glycemic Control (2015)  Target Ranges:  Prepandial:   less than 140 mg/dL      Peak postprandial:   less than 180 mg/dL (1-2 hours)      Critically ill patients:  140 - 180 mg/dL   Lab Results  Component Value Date   GLUCAP 170 (H) 09/19/2020   HGBA1C 10.2 (H) 09/18/2020    Review of Glycemic Control Results for KONSTANTINE, GERVASI (MRN [id number]) as of 09/18/2020 10:10  Ref. Range 11/28/2019 05:13 09/18/2020 08:41  Hemoglobin A1C Latest Ref Range: 4.8 - 5.6 % 4.3 (L) 10.2 (H)   Results for SALVADOR, COUPE (MRN [id number]) as of 09/19/2020 09:49  Ref. Range 09/18/2020 12:31 09/18/2020 16:12 09/18/2020 19:27 09/18/2020 23:18 09/19/2020 03:07 09/19/2020 08:07  Glucose-Capillary Latest Ref Range: 70 - 99 mg/dL 161 (H) 106 (H) 162 (H) 151 (H) 102 (H) 170 (H)   Diabetes history:  Do not see prior history of DM  Current orders for Inpatient glycemic control:  Novolog 0-9 units Q4 hours  A1c 10.2 on 3/14 Has been to [facility], Dr. Margarita Rana, [place] last visit on 01/12/20  Glucose controlled inpatient by Novolog sensitive correction scale. Consider simplifying regimen for home with orals only with glucose checks 2 times a day (fasting and alternating second check).  Looks like new diagnosis of Diabetes. COVID in January this year most likely with steroid use.  Spoke with pt and mother yesterday see note  D/C: Blood glucose meter kit order # [id number]  Thanks,  Tama Headings RN, MSN, BC-ADM Inpatient Diabetes Coordinator Team Pager [phone] (8a-5p)

## 2020-09-19 NOTE — Progress Notes (Signed)
SLP Cancellation Note  Patient Details Name: ASHAWN RINEHART MRN: [id number] DOB: 24-Jan-1971   Cancelled treatment:       Reason Eval/Treat Not Completed: SLP screened, no needs identified, will sign off. Discussed pt with RN - SLP received order for swallowing evaluation. Per MD note, nursing had reported that pt passed his swallowing screen, and a diet was therefore initiated. Documentation not yet in the chart, but RN reports that she will document this. Given that pt has passed a [facility] swallow screen, SLP evaluation is therefore not needed unless pt were to have difficulty with diet. Will therefore defer evaluation at this time - RN in agreement.     Osie Bond., M.A. [facility] Pager [phone] Office [phone]  09/19/2020, 10:23 AM

## 2020-09-19 NOTE — TOC Initial Note (Signed)
Transition of Care [facility]) - Initial/Assessment Note    Patient Details  Name: Gary Mitchell MRN: [id number] Date of Birth: 10-11-1970  Transition of Care [facility]) CM/SW Contact:    Pollie Friar, RN Phone Number: 09/19/2020, 1:13 PM  Clinical Narrative:                 Patient lives at home with his mother. Pt states he has no insurance but does attend the [facility] for PCP and uses their pharmacy for his medications.  Pt uses the bus for his transportation needs.  No DME at home. Medications will need to be sent to [facility] pharmacy at d/c for them to assist with the cost. (M-F until 5:30 pm) TOC following.  Expected Discharge Plan: Home/Self Care Barriers to Discharge: Continued Medical Work up,Inadequate or no insurance   Patient Goals and CMS Choice        Expected Discharge Plan and Services Expected Discharge Plan: Home/Self Care   Discharge Planning Services: CM Consult   Living arrangements for the past 2 months: Single Family Home                                      Prior Living Arrangements/Services Living arrangements for the past 2 months: Single Family Home Lives with:: Parents (Mother) Patient language and need for interpreter reviewed:: Yes Do you feel safe going back to the place where you live?: Yes            Criminal Activity/Legal Involvement Pertinent to Current Situation/Hospitalization: No - Comment as needed  Activities of Daily Living      Permission Sought/Granted                  Emotional Assessment Appearance:: Appears stated age Attitude/Demeanor/Rapport: Engaged Affect (typically observed): Accepting Orientation: : Oriented to Self,Oriented to Place,Oriented to  Time,Oriented to Situation   Psych Involvement: No (comment)  Admission diagnosis:  Seizure [facility]) [R56.9] Patient Active Problem List   Diagnosis Date Noted  . Seizure ([place]) 09/17/2020  . Alcoholic cirrhosis of liver without ascites ([place]) 09/17/2020  . Acute  hepatic encephalopathy 09/17/2020  . Hypomagnesemia 09/17/2020  . Suspected CVA (cerebral vascular accident) ([place]) 09/17/2020  . Hypokalemia 09/17/2020  . Seizures ([place]) 07/09/2020  . High bilirubin 07/09/2020  . Anemia 07/09/2020  . Thrombocytopenia ([place]) 07/09/2020  . COVID-19 virus infection 07/09/2020  . GERD (gastroesophageal reflux disease) 07/09/2020  . Pancreatic mass 11/29/2019  . Acute pancreatitis 11/23/2019  . Alcoholic hepatitis 01/75/1025  . Gallstones 11/23/2019  . Alcohol abuse 11/23/2019   PCP:  Charlott Rakes, MD Pharmacy:   [facility],  [address] [place] [address] Phone: [phone] Fax: [phone]  [id number] - [place], [place]. [place] [address] Phone: [phone] Fax: [phone]  [place], [place] - [address] [place] [address] Phone: [phone] Fax: [place], [place] [address] Phone: [phone] Fax: [phone]     Social Determinants of Health (SDOH) Interventions    Readmission Risk Interventions Readmission Risk Prevention Plan 11/24/2019  Post Dischage Appt Complete  Medication Screening Complete  Transportation Screening Complete  Some recent data might be hidden

## 2020-09-19 NOTE — Progress Notes (Addendum)
Neurology Progress Note  S: No overnight events. No seizures per patient or staff. On Keppra 500mg  IV q12 hrs. Had MRI brain yesterday which did not confirm stroke suspected on CT. Feels less confused today. Wants to eat.   O: Current vital signs: BP 128/90 (BP Location: Right Arm)   Pulse (!) 106   Temp 98 F (36.7 C) (Oral)   Resp 16   SpO2 100%  Vital signs in last 24 hours: Temp:  [97.5 F (36.4 C)-98.4 F (36.9 C)] 98 F (36.7 C) (03/15 0723) Pulse Rate:  [98-106] 106 (03/15 0723) Resp:  [16-18] 16 (03/15 0723) BP: (124-143)/(73-101) 128/90 (03/15 0723) SpO2:  [98 %-100 %] 100 % (03/15 0723)  GENERAL: Awake, alert in NAD. Improved mental status over yesterday.  HEENT: Normocephalic and atraumatic LUNGS: Normal respiratory effort.  CV: RRR ABDOMEN: Soft, nontender Ext: warm   NEURO:  Mental Status: Alert and oriented except to day and date.  Speech/Language: speech is without aphasia or dysarthria.  Naming, repetition, fluency, and comprehension intact.  Cranial Nerves:  II: PERRL. Visual fields full.  III, IV, VI: EOMI. Eyelids elevate symmetrically.  V: Sensation is intact to light touch and symmetrical to face.  VII: Smile is symmetrical. Able to puff cheeks and raise eyebrows.  VIII: hearing intact to voice. IX, X: Palate elevates symmetrically. Phonation is normal.  HF:WYOVZCHY shrug 5/5. XII: tongue is midline without fasciculations. Motor: 5/5 strength to all muscle groups tested.  Tone: is normal and bulk is normal Sensation- Intact to light touch bilaterally. Extinction absent to light touch to DSS.    Coordination: no clonus, no tremor. No ataxia with FNF.   Gait- deferred  Medications  Current Facility-Administered Medications:  .  0.9 %  sodium chloride infusion (Manually program via Guardrails IV Fluids), , Intravenous, Once, Shalhoub, Sherryll Burger, MD .  acetaminophen (TYLENOL) tablet 325 mg, 325 mg, Oral, Q6H PRN, Vernelle Emerald, MD, 325 mg at  09/19/20 0453 .  cefTRIAXone (ROCEPHIN) 2 g in sodium chloride 0.9 % 100 mL IVPB, 2 g, Intravenous, Q24H, Shalhoub, Sherryll Burger, MD, Last Rate: 200 mL/hr at 09/19/20 0454, 2 g at 09/19/20 0454 .  folic acid (FOLVITE) tablet 1 mg, 1 mg, Oral, Daily, Garba, Mohammad L, MD .  insulin aspart (novoLOG) injection 0-9 Units, 0-9 Units, Subcutaneous, Q4H, Elodia Florence., MD, 2 Units at 09/19/20 0830 .  lactulose (CHRONULAC) 10 GM/15ML solution 30 g, 30 g, Oral, TID, Shalhoub, Sherryll Burger, MD, 30 g at 09/18/20 2132 .  levETIRAcetam (KEPPRA) IVPB 500 mg/100 mL premix, 500 mg, Intravenous, Q12H, Garba, Mohammad L, MD, Last Rate: 400 mL/hr at 09/18/20 2115, 500 mg at 09/18/20 2115 .  LORazepam (ATIVAN) tablet 1-4 mg, 1-4 mg, Oral, Q1H PRN **OR** LORazepam (ATIVAN) injection 1-4 mg, 1-4 mg, Intravenous, Q1H PRN, Jonelle Sidle, Mohammad L, MD .  LORazepam (ATIVAN) injection 2 mg, 2 mg, Intravenous, PRN, Elwyn Reach, MD, 2 mg at 09/17/20 2019 .  magnesium sulfate IVPB 4 g 100 mL, 4 g, Intravenous, Once, Elodia Florence., MD .  multivitamin with minerals tablet 1 tablet, 1 tablet, Oral, Daily, Elwyn Reach, MD, 1 tablet at 09/18/20 1224 .  [COMPLETED] octreotide (SANDOSTATIN) 2 mcg/mL load via infusion 50 mcg, 50 mcg, Intravenous, Once, 50 mcg at 09/18/20 0705 **AND** octreotide (SANDOSTATIN) 500 mcg in sodium chloride 0.9 % 250 mL (2 mcg/mL) infusion, 50 mcg/hr, Intravenous, Continuous, Shalhoub, Sherryll Burger, MD, Last Rate: 25 mL/hr at 09/18/20 2109, 50 mcg/hr  at 09/18/20 2109 .  ondansetron (ZOFRAN) tablet 4 mg, 4 mg, Oral, Q6H PRN **OR** ondansetron (ZOFRAN) injection 4 mg, 4 mg, Intravenous, Q6H PRN, Jonelle Sidle, Mohammad L, MD .  [COMPLETED] pantoprazole (PROTONIX) 80 mg in sodium chloride 0.9 % 100 mL IVPB, 80 mg, Intravenous, Once, Last Rate: 300 mL/hr at 09/18/20 0702, 80 mg at 09/18/20 0702 **FOLLOWED BY** pantoprazole (PROTONIX) injection 40 mg, 40 mg, Intravenous, Q12H, Shalhoub, Sherryll Burger, MD, 40 mg at  09/18/20 1925 .  phosphorus (K PHOS NEUTRAL) tablet 500 mg, 500 mg, Oral, QID, Elodia Florence., MD, 500 mg at 09/18/20 2126 .  potassium chloride SA (KLOR-CON) CR tablet 40 mEq, 40 mEq, Oral, Q4H, Elodia Florence., MD, 40 mEq at 09/19/20 0830 .  sodium chloride flush (NS) 0.9 % injection 10-40 mL, 10-40 mL, Intracatheter, Q12H, Elodia Florence., MD, 10 mL at 09/18/20 2128 .  sodium chloride flush (NS) 0.9 % injection 10-40 mL, 10-40 mL, Intracatheter, PRN, Elodia Florence., MD .  thiamine 500mg  in normal saline (72ml) IVPB, 500 mg, Intravenous, Q8H, Last Rate: 100 mL/hr at 09/19/20 0837, 500 mg at 09/19/20 0837 **FOLLOWED BY** [START ON 09/21/2020] thiamine (B-1) 250 mg in sodium chloride 0.9 % 50 mL IVPB, 250 mg, Intravenous, Daily **FOLLOWED BY** [START ON 09/26/2020] thiamine tablet 100 mg, 100 mg, Oral, Daily, Elodia Florence., MD  Imaging MD has reviewed images in epic and the results pertinent to this consultation are:  MRI Brain Abbreviated and motion degraded exam. No acute or reversible finding. Mild chronic small-vessel ischemic change of the cerebral hemispheric white matter. No specific abnormality seen to explain seizure. Diffusion imaging shows some high signal in the brainstem and cerebral peduncles and lower radiating white matter tracts, but does not show true restricted diffusion on the ADC map and therefore I think the finding is artifactual.  Assessment: 50 yo male with significant history of ETOH abuse and severe alcoholic liver disease who presented with seizures. He has a known history of seizures with non adherence to medications and f/up neurology appointments. He has had no further seizure activity on Keppra.   Impression: 1. Seizure disorder 2. ETOH abuse. 3. Severe ETOH liver disease. 4. Anemia 5. Suspicion for CVA on CT but not present on MRI brain.   Plan: 1. Continue Keppra 500mg  IV q12 hrs, then may switch to po when diet  advanced.  Discharge patient on Keppra 500mg  po q12 hours and f/up out patient neurology in 2-4 weeks after discharge.  2. Continue to work on Discharge planning for access to medications and possible ETOH cessation.  3. Does not need stroke workup given no stroke on MRI brain.  4. Rest per primary team and GI.  Neuro will be available for questions. Please call if any further seizure activity noted.   Pt seen by Clance Boll, MSN, APN-BC/Nurse Practitioner/Neuro and discussed with Dr. Leonel Ramsay who agrees with plan.  Pager: [phone]

## 2020-09-19 NOTE — Plan of Care (Signed)
  Problem: Health Behavior/Discharge Planning: Goal: Compliance with prescribed medication regimen will improve 09/19/2020 1601 by Drucie Ip I, RN Outcome: Progressing 09/19/2020 1600 by Drucie Ip I, RN Outcome: Progressing

## 2020-09-20 ENCOUNTER — Inpatient Hospital Stay (HOSPITAL_COMMUNITY): Payer: 59 | Admitting: Certified Registered Nurse Anesthetist

## 2020-09-20 ENCOUNTER — Encounter (HOSPITAL_COMMUNITY): Payer: Self-pay | Admitting: Internal Medicine

## 2020-09-20 ENCOUNTER — Encounter (HOSPITAL_COMMUNITY)
Admission: EM | Disposition: A | Discharge status: Home / Self Care | Payer: Self-pay | Source: Home / Self Care | Attending: Family Medicine

## 2020-09-20 DIAGNOSIS — K3189 Other diseases of stomach and duodenum: Secondary | ICD-10-CM

## 2020-09-20 DIAGNOSIS — I8510 Secondary esophageal varices without bleeding: Secondary | ICD-10-CM

## 2020-09-20 DIAGNOSIS — I8500 Esophageal varices without bleeding: Secondary | ICD-10-CM

## 2020-09-20 DIAGNOSIS — K766 Portal hypertension: Secondary | ICD-10-CM

## 2020-09-20 DIAGNOSIS — K7030 Alcoholic cirrhosis of liver without ascites: Secondary | ICD-10-CM

## 2020-09-20 HISTORY — PX: ENTEROSCOPY: SHX5533

## 2020-09-20 LAB — GLUCOSE, CAPILLARY
Glucose-Capillary: 110 mg/dL — ABNORMAL HIGH (ref 70–99)
Glucose-Capillary: 160 mg/dL — ABNORMAL HIGH (ref 70–99)
Glucose-Capillary: 139 mg/dL — ABNORMAL HIGH (ref 70–99)
Glucose-Capillary: 213 mg/dL — ABNORMAL HIGH (ref 70–99)
Glucose-Capillary: 131 mg/dL — ABNORMAL HIGH (ref 70–99)

## 2020-09-20 LAB — CBC WITH DIFFERENTIAL/PLATELET
Abs Immature Granulocytes: 0.01 10*3/uL (ref 0.00–0.07)
Basophils Absolute: 0.1 10*3/uL (ref 0.0–0.1)
Basophils Relative: 1 %
Eosinophils Absolute: 0.1 10*3/uL (ref 0.0–0.5)
Eosinophils Relative: 2 %
HCT: 23.7 % — ABNORMAL LOW (ref 39.0–52.0)
Hemoglobin: 8.3 g/dL — ABNORMAL LOW (ref 13.0–17.0)
Immature Granulocytes: 0 %
Lymphocytes Relative: 29 %
Lymphs Abs: 2 10*3/uL (ref 0.7–4.0)
MCH: 28.5 pg (ref 26.0–34.0)
MCHC: 35 g/dL (ref 30.0–36.0)
MCV: 81.4 fL (ref 80.0–100.0)
Monocytes Absolute: 1.3 10*3/uL — ABNORMAL HIGH (ref 0.1–1.0)
Monocytes Relative: 19 %
Neutro Abs: 3.4 10*3/uL (ref 1.7–7.7)
Neutrophils Relative %: 49 %
Platelets: 75 10*3/uL — ABNORMAL LOW (ref 150–400)
RBC: 2.91 MIL/uL — ABNORMAL LOW (ref 4.22–5.81)
RDW: 22.1 % — ABNORMAL HIGH (ref 11.5–15.5)
WBC: 6.8 10*3/uL (ref 4.0–10.5)
nRBC: 0 % (ref 0.0–0.2)

## 2020-09-20 LAB — COMPREHENSIVE METABOLIC PANEL
ALT: 15 U/L (ref 0–44)
AST: 46 U/L — ABNORMAL HIGH (ref 15–41)
Albumin: 2.6 g/dL — ABNORMAL LOW (ref 3.5–5.0)
Alkaline Phosphatase: 136 U/L — ABNORMAL HIGH (ref 38–126)
Anion gap: 9 (ref 5–15)
BUN: <5 mg/dL — ABNORMAL LOW (ref 6–20)
CO2: 21 mmol/L — ABNORMAL LOW (ref 22–32)
Calcium: 8 mg/dL — ABNORMAL LOW (ref 8.9–10.3)
Chloride: 104 mmol/L (ref 98–111)
Creatinine, Ser: 0.56 mg/dL — ABNORMAL LOW (ref 0.61–1.24)
GFR, Estimated: >60 mL/min (ref >60)
Glucose, Bld: 111 mg/dL — ABNORMAL HIGH (ref 70–99)
Potassium: 3.1 mmol/L — ABNORMAL LOW (ref 3.5–5.1)
Sodium: 134 mmol/L — ABNORMAL LOW (ref 135–145)
Total Bilirubin: 4.8 mg/dL — ABNORMAL HIGH (ref 0.3–1.2)
Total Protein: 6.7 g/dL (ref 6.5–8.1)

## 2020-09-20 LAB — MAGNESIUM: Magnesium: 1.6 mg/dL — ABNORMAL LOW (ref 1.7–2.4)

## 2020-09-20 LAB — PHOSPHORUS: Phosphorus: 2.8 mg/dL (ref 2.5–4.6)

## 2020-09-20 SURGERY — ENTEROSCOPY
Anesthesia: Monitor Anesthesia Care

## 2020-09-20 MED ORDER — POTASSIUM CHLORIDE 10 MEQ/100ML IV SOLN
10.0000 meq | INTRAVENOUS | Status: AC
  Administered 2020-09-20 (×2): 10 meq via INTRAVENOUS
  Filled 2020-09-20 (×2): qty 100

## 2020-09-20 MED ORDER — PHENYLEPHRINE HCL (PRESSORS) 10 MG/ML IV SOLN
INTRAVENOUS | Status: DC | PRN
  Administered 2020-09-20: 80 ug via INTRAVENOUS
  Administered 2020-09-20: 120 ug via INTRAVENOUS
  Administered 2020-09-20: 80 ug via INTRAVENOUS

## 2020-09-20 MED ORDER — PROPOFOL 500 MG/50ML IV EMUL
INTRAVENOUS | Status: DC | PRN
  Administered 2020-09-20: 125 ug/kg/min via INTRAVENOUS

## 2020-09-20 MED ORDER — DEXMEDETOMIDINE (PRECEDEX) IN NS 20 MCG/5ML (4 MCG/ML) IV SYRINGE
PREFILLED_SYRINGE | INTRAVENOUS | Status: DC | PRN
  Administered 2020-09-20: 8 ug via INTRAVENOUS

## 2020-09-20 MED ORDER — LACTATED RINGERS IV SOLN
INTRAVENOUS | Status: AC | PRN
  Administered 2020-09-20: 1000 mL via INTRAVENOUS

## 2020-09-20 MED ORDER — LIDOCAINE 2% (20 MG/ML) 5 ML SYRINGE
INTRAMUSCULAR | Status: DC | PRN
  Administered 2020-09-20: 80 mg via INTRAVENOUS

## 2020-09-20 MED ORDER — POTASSIUM CHLORIDE 10 MEQ/100ML IV SOLN
10.0000 meq | INTRAVENOUS | Status: AC
  Administered 2020-09-20: 10 meq via INTRAVENOUS
  Filled 2020-09-20: qty 100

## 2020-09-20 MED ORDER — PROPOFOL 10 MG/ML IV BOLUS
INTRAVENOUS | Status: DC | PRN
  Administered 2020-09-20 (×2): 20 mg via INTRAVENOUS

## 2020-09-20 NOTE — Progress Notes (Signed)
Inpatient Diabetes Program Recommendations  AACE/ADA: New Consensus Statement on Inpatient Glycemic Control (2015)  Target Ranges:  Prepandial:   less than 140 mg/dL      Peak postprandial:   less than 180 mg/dL (1-2 hours)      Critically ill patients:  140 - 180 mg/dL   Lab Results  Component Value Date   GLUCAP 160 (H) 09/20/2020   HGBA1C 10.2 (H) 09/18/2020    Review of Glycemic Control Results for Gary Mitchell, Gary Mitchell (MRN [id number]) as of 09/18/2020 10:10  Ref. Range 11/28/2019 05:13 09/18/2020 08:41  Hemoglobin A1C Latest Ref Range: 4.8 - 5.6 % 4.3 (L) 10.2 (H)   Results for Gary Mitchell, Gary Mitchell (MRN [id number]) as of 09/20/2020 09:33  Ref. Range 09/19/2020 08:07 09/19/2020 12:16 09/19/2020 16:53 09/19/2020 20:07 09/19/2020 23:56 09/20/2020 03:56 09/20/2020 07:55  Glucose-Capillary Latest Ref Range: 70 - 99 mg/dL 170 (H) 236 (H) 218 (H) 160 (H) 210 (H) 110 (H) 160 (H)    Diabetes history:  Do not see prior history of DM  Current orders for Inpatient glycemic control:  Novolog 0-9 units Q4 hours  A1c 10.2 on 3/14 Has been to [facility], Dr. Margarita Rana, Gary Mitchell last visit on 01/12/20  Glucose controlled inpatient by Novolog sensitive correction scale. Consider simplifying regimen for home with orals only with glucose checks 2 times a day (fasting and alternating second check).   Mother requests a medication other than metformin due to its side effects.  Looks like new diagnosis of Diabetes. COVID in January this year most likely with steroid use.  Spoke with pt and mother yesterday see note  D/C: Blood glucose meter kit order # [id number]  Thanks,  Gary Headings RN, MSN, BC-ADM Inpatient Diabetes Coordinator Team Pager [phone] (8a-5p)

## 2020-09-20 NOTE — Op Note (Signed)
[facility] Patient Name: Gary Mitchell Procedure Date : 09/20/2020 MRN: [id number] Attending MD: Thornton Park MD, MD Date of Birth: 04-06-1971 CSN: [id number] Age: 50 Admit Type: Inpatient Procedure:                Upper GI endoscopy Indications:              Suspected upper gastrointestinal bleeding,                            cirrhosis with underlying portal hypertension Providers:                Thornton Park MD, MD, Nelia Shi, RN,                            Elspeth Cho Tech., Technician, Tressia Miners,                            CRNA Referring MD:              Medicines:                Monitored Anesthesia Care Complications:            No immediate complications. Estimated Blood Loss:     Estimated blood loss: none. Procedure:                Pre-Anesthesia Assessment:                           - Prior to the procedure, a History and Physical                            was performed, and patient medications and                            allergies were reviewed. The patient's tolerance of                            previous anesthesia was also reviewed. The risks                            and benefits of the procedure and the sedation                            options and risks were discussed with the patient.                            All questions were answered, and informed consent                            was obtained. Prior Anticoagulants: The patient has                            taken no previous anticoagulant or antiplatelet                            agents. ASA Grade Assessment:  III - A patient with                            severe systemic disease. After reviewing the risks                            and benefits, the patient was deemed in                            satisfactory condition to undergo the procedure.                           After obtaining informed consent, the endoscope was                            passed  under direct vision. Throughout the                            procedure, the patient's blood pressure, pulse, and                            oxygen saturations were monitored                            continuously.After obtaining informed consent, the                            endoscope was passed under direct vision.                            Throughout the procedure, the patient's blood                            pressure, pulse, and oxygen saturations were                            monitored continuously. The [id number]-H190 ([id number])                            Olympus gastroscope was introduced through the                            mouth, and advanced to the third part of duodenum.                            The upper GI endoscopy was performed with moderate                            difficulty due to abnormal anatomy. The patient                            tolerated the procedure well. Scope In: Scope Out: Findings:      Two columns of grade I varices with no bleeding and no stigmata of       recent  bleeding were found in the distal third of the esophagus,. No red       wale signs were present.      The Z-line was slightly irregular and was found 39 cm from the incisors.      Portal hypertensive gastropathy was found in the gastric fundus and in       the gastric body. No gastric varices.      A deformity was found in the prepyloric region of the stomach suggesting       prior peptic ulcer disease. However, the mucosa appears normal today. No       evidence for active or recent bleeding.      The examined duodenum was normal. No blood present. No evidence for       active or recent bleeding. Impression:               - Grade I esophageal varices with no bleeding and                            no stigmata of recent bleeding.                           - Z-line irregular, 39 cm from the incisors.                           - Portal hypertensive gastropathy. No gastric                             varices.                           - Deformity in the prepyloric region of the stomach                            suggesting prior ulcer disease.                           - No evidence for active or recent bleeding. Recommendation:           - Return patient to hospital ward for ongoing care.                           - Advance diet as tolerated. 2000mg  sodium                            restricted diet preferred.                           - Continue present medications.                           - Discontinue octreotide. Continue pantoprazole.                            Complete 5 days of antibiotics.                           - Given his underlying liver disease (CPT  9,                            Child's B and MELD), consider trial of                            non-selective beta-blocker therapy. Procedure Code(s):        --- Professional ---                           [phone], Esophagogastroduodenoscopy, flexible,                            transoral; diagnostic, including collection of                            specimen(s) by brushing or washing, when performed                            (separate procedure) Diagnosis Code(s):        --- Professional ---                           I85.00, Esophageal varices without bleeding                           K22.8, Other specified diseases of esophagus                           K76.6, Portal hypertension                           K31.89, Other diseases of stomach and duodenum CPT copyright 2019 American Medical Association. All rights reserved. The codes documented in this report are preliminary and upon coder review may  be revised to meet current compliance requirements. Thornton Park MD, MD 09/20/2020 11:10:20 AM This report has been signed electronically. Number of Addenda: 0

## 2020-09-20 NOTE — Anesthesia Preprocedure Evaluation (Signed)
Anesthesia Evaluation  Patient identified by MRN, date of birth, ID band Patient awake    Reviewed: Allergy & Precautions, H&P , NPO status , Patient's Chart, lab work & pertinent test results  Airway Mallampati: II   Neck ROM: full    Dental   Pulmonary Current Smoker,    breath sounds clear to auscultation       Cardiovascular negative cardio ROS   Rhythm:regular Rate:Normal     Neuro/Psych Seizures -,     GI/Hepatic GERD  ,(+) Cirrhosis     substance abuse  alcohol use,   Endo/Other    Renal/GU      Musculoskeletal   Abdominal   Peds  Hematology  (+) Blood dyscrasia, anemia ,   Anesthesia Other Findings   Reproductive/Obstetrics                             Anesthesia Physical Anesthesia Plan  ASA: II  Anesthesia Plan: MAC   Post-op Pain Management:    Induction: Intravenous  PONV Risk Score and Plan: 1 and Propofol infusion and Treatment may vary due to age or medical condition  Airway Management Planned: Nasal Cannula  Additional Equipment:   Intra-op Plan:   Post-operative Plan:   Informed Consent: I have reviewed the patients History and Physical, chart, labs and discussed the procedure including the risks, benefits and alternatives for the proposed anesthesia with the patient or authorized representative who has indicated his/her understanding and acceptance.     Dental advisory given  Plan Discussed with: CRNA and Anesthesiologist  Anesthesia Plan Comments:         Anesthesia Quick Evaluation

## 2020-09-20 NOTE — TOC CAGE-AID Note (Signed)
Transition of Care [facility]) - CAGE-AID Screening   Patient Details  Name: Gary Mitchell MRN: [id number] Date of Birth: 07-04-1971  [facility] CM/SW Contact:    Marney Setting, [place] Work Phone Number: 09/20/2020, 2:38 PM   Clinical Narrative: MSW Student spoke with patient about alcohol use and performed CAGE-AID. Patient said he is very confident that he will stay sober because he does not want to keep coming back to the hospital. Patient refused resources and said he does not need any help.     CAGE-AID Screening:    Have You Ever Felt You Ought to Cut Down on Your Drinking or Drug Use?: Yes Have People Annoyed You By Critizing Your Drinking Or Drug Use?: No Have You Felt Bad Or Guilty About Your Drinking Or Drug Use?: No Have You Ever Had a Drink or Used Drugs First Thing In The Morning to Steady Your Nerves or to Get Rid of a Hangover?: Yes CAGE-AID Score: 2  Substance Abuse Education Offered: Yes  Substance abuse interventions: Patient Counseling

## 2020-09-20 NOTE — Transfer of Care (Signed)
Immediate Anesthesia Transfer of Care Note  Patient: Gary Mitchell  Procedure(s) Performed: ENTEROSCOPY (N/A )  Patient Location: Endoscopy Unit  Anesthesia Type:MAC  Level of Consciousness: drowsy, patient cooperative and responds to stimulation  Airway & Oxygen Therapy: Patient Spontanous Breathing  Post-op Assessment: Report given to RN, Post -op Vital signs reviewed and stable and Patient moving all extremities  Post vital signs: Reviewed and stable  Last Vitals:  Vitals Value Taken Time  BP 97/49 09/20/20 1047  Temp 36.7 C 09/20/20 1045  Pulse 82 09/20/20 1047  Resp 15 09/20/20 1047  SpO2 100 % 09/20/20 1047  Vitals shown include unvalidated device data.  Last Pain:  Vitals:   09/20/20 1045  TempSrc: Temporal  PainSc: Asleep      Patients Stated Pain Goal: 0 (41/58/30 9407)  Complications: No complications documented.

## 2020-09-20 NOTE — Plan of Care (Signed)

## 2020-09-20 NOTE — Interval H&P Note (Signed)
History and Physical Interval Note:  09/20/2020 9:54 AM  Gary Mitchell  has presented today for surgery, with the diagnosis of Reported bleeding per rectum.  Cirrhosis and no previous variceal screening..  The various methods of treatment have been discussed with the patient and family. After consideration of risks, benefits and other options for treatment, the patient has consented to  Upper Endoscopy as a surgical intervention.  The patient's history has been reviewed, patient examined, no change in status, stable for surgery.  I have reviewed the patient's chart and labs.  Questions were answered to the patient's satisfaction.     Thornton Park

## 2020-09-20 NOTE — Progress Notes (Signed)
PROGRESS NOTE  Gary Mitchell XJO:[id number] DOB: Feb 25, 1971 DOA: 09/17/2020 PCP: Charlott Rakes, MD   LOS: 3 days   Brief Narrative / Interim history: 50 year old male with alcohol abuse, alcoholic liver disease, seizure disorder on Keppra, recent COVID-19 pneumonia in January, anemia of chronic disease who presented with multiple seizures, hepatic encephalopathy, and acute blood loss anemia.  Subjective / 24h Interval events: Groggy and sleepy this morning, awaiting his endoscopy  Assessment & Plan: Principal Problem Recurrent seizures-reported prior to admission, neurology consulted and following.  He is status post Keppra load and currently on Keppra 500 mg twice daily she is to be continued following discharge.  Active Problems Hepatic encephalopathy, alcoholic cirrhosis-continue lactulose, mental status gradually improving.-Abdominal ultrasound without ascites.  GI consulted and following, appreciate input  Acute blood loss anemia, concern for GI bleed-GI consulted, he will get an endoscopy today.  He is on PPI, octreotide, ceftriaxone.  Has received a unit of packed red blood cells on 3/14.  Hemoglobin overall stable  Hypokalemia, hypomagnesemia, hypophosphatemia-replete and follow.  Recent Covid infection, January 2022-noted  Scheduled Meds: . sodium chloride   Intravenous Once  . folic acid  1 mg Oral Daily  . insulin aspart  0-9 Units Subcutaneous Q4H  . lactulose  30 g Oral TID  . multivitamin with minerals  1 tablet Oral Daily  . pantoprazole  40 mg Oral Q0600  . phosphorus  500 mg Oral QID  . sodium chloride flush  10-40 mL Intracatheter Q12H  . [START ON 09/26/2020] thiamine  100 mg Oral Daily   Continuous Infusions: . cefTRIAXone (ROCEPHIN)  IV 2 g (09/20/20 0447)  . levETIRAcetam 500 mg (09/19/20 2121)  . octreotide  (SANDOSTATIN)    IV infusion 50 mcg/hr (09/19/20 2115)  . potassium chloride    . thiamine injection 500 mg (09/20/20 0105)   Followed by  .  [START ON 09/21/2020] thiamine injection     PRN Meds:.acetaminophen, LORazepam **OR** LORazepam, LORazepam, ondansetron **OR** ondansetron (ZOFRAN) IV, sodium chloride flush  Diet Orders (From admission, onward)    Start     Ordered   09/20/20 0001  Diet NPO time specified Except for: Sips with Meds  Diet effective midnight       Question:  Except for  Answer:  Sips with Meds   09/19/20 1518          DVT prophylaxis: Place and maintain sequential compression device Start: 09/18/20 0945     Code Status: Full Code  Family Communication: no family at bedside   Status is: Inpatient  Remains inpatient appropriate because:Inpatient level of care appropriate due to severity of illness   Dispo: The patient is from: Home              Anticipated d/c is to: Home              Patient currently is not medically stable to d/c.   Difficult to place patient No   Level of care: Progressive  Consultants:  GI  Procedures:  None   Microbiology  None   Antimicrobials: Ceftriaxone    Objective: Vitals:   09/19/20 2058 09/19/20 2358 09/20/20 0357 09/20/20 0721  BP: 131/90 134/79 120/88 105/65  Pulse: (!) 102 88 94 89  Resp: 17 20 14 16   Temp: 97.7 F (36.5 C) 99 F (37.2 C) 98 F (36.7 C) 97.8 F (36.6 C)  TempSrc: Oral   Oral  SpO2: 100% 100% 99% 100%    Intake/Output Summary (Last 24  hours) at 09/20/2020 0953 Last data filed at 09/20/2020 0200 Gross per 24 hour  Intake 1317.47 ml  Output 1100 ml  Net 217.47 ml   There were no vitals filed for this visit.  Examination:  Constitutional: NAD Eyes: no scleral icterus ENMT: Mucous membranes are moist.  Neck: normal, supple Respiratory: clear to auscultation bilaterally, no wheezing, no crackles. Normal respiratory effort. N Cardiovascular: Regular rate and rhythm, no murmurs / rubs / gallops.  Abdomen: non distended, no tenderness. Bowel sounds positive.  Musculoskeletal: no clubbing / cyanosis.  Skin: no  rashes Neurologic: non focal   Data Reviewed: I have independently reviewed following labs and imaging studies   CBC: Recent Labs  Lab 09/17/20 1545 09/17/20 1606 09/18/20 0217 09/18/20 1919 09/19/20 0305 09/20/20 0414  WBC 7.3  --  7.6  --  7.9 6.8  NEUTROABS 4.9  --   --   --  4.4 3.4  HGB 7.6* 9.2* 6.7* 9.2* 8.5* 8.3*  HCT 23.8* 27.0* 19.2* 27.9* 24.6* 23.7*  MCV 84.1  --  78.4*  --  80.4 81.4  PLT 69*  --  69*  --  71* 75*   Basic Metabolic Panel: Recent Labs  Lab 09/17/20 1545 09/17/20 1606 09/18/20 0217 09/19/20 0305 09/20/20 0414  NA 125* 127* 130* 131* 134*  K 2.7* 2.7* 2.8* 3.0* 3.1*  CL 86* 88* 96* 98 104  CO2 11*  --  21* 21* 21*  GLUCOSE 254* 257* 206* 101* 111*  BUN 6 6 5* <5* <5*  CREATININE 0.79 0.40* 0.64 0.62 0.56*  CALCIUM 8.5*  --  7.7* 7.5* 8.0*  MG 1.5*  --  1.6* 1.2* 1.6*  PHOS  --   --  1.5* 2.1* 2.8   Liver Function Tests: Recent Labs  Lab 09/17/20 1545 09/18/20 0217 09/19/20 0305 09/20/20 0414  AST 107* 87* 62* 46*  ALT 24 21 18 15   ALKPHOS 162* 152* 151* 136*  BILITOT 6.8* 6.5* 5.9* 4.8*  PROT 7.7 6.7 6.9 6.7  ALBUMIN 3.0* 2.6* 2.6* 2.6*   Coagulation Profile: Recent Labs  Lab 09/17/20 1545 09/18/20 1334  INR 1.5* 1.4*   HbA1C: Recent Labs    09/18/20 0841  HGBA1C 10.2*   CBG: Recent Labs  Lab 09/19/20 1653 09/19/20 2007 09/19/20 2356 09/20/20 0356 09/20/20 0755  GLUCAP 218* 160* 210* 110* 160*    No results found for this or any previous visit (from the past 240 hour(s)).   Radiology Studies: No results found.  Marzetta Board, MD, PhD [facility]  Between 7 am - 7 pm I am available, please contact me via Amion or Securechat  Between 7 pm - 7 am I am not available, please contact night coverage MD/APP via Amion

## 2020-09-21 ENCOUNTER — Other Ambulatory Visit: Payer: Self-pay | Admitting: Internal Medicine

## 2020-09-21 DIAGNOSIS — F1010 Alcohol abuse, uncomplicated: Secondary | ICD-10-CM

## 2020-09-21 DIAGNOSIS — K7200 Acute and subacute hepatic failure without coma: Secondary | ICD-10-CM | POA: Diagnosis not present

## 2020-09-21 DIAGNOSIS — D62 Acute posthemorrhagic anemia: Secondary | ICD-10-CM

## 2020-09-21 LAB — COMPREHENSIVE METABOLIC PANEL
ALT: 14 U/L (ref 0–44)
AST: 43 U/L — ABNORMAL HIGH (ref 15–41)
Albumin: 2.7 g/dL — ABNORMAL LOW (ref 3.5–5.0)
Alkaline Phosphatase: 157 U/L — ABNORMAL HIGH (ref 38–126)
Anion gap: 10 (ref 5–15)
BUN: <5 mg/dL — ABNORMAL LOW (ref 6–20)
CO2: 20 mmol/L — ABNORMAL LOW (ref 22–32)
Calcium: 8.4 mg/dL — ABNORMAL LOW (ref 8.9–10.3)
Chloride: 100 mmol/L (ref 98–111)
Creatinine, Ser: 0.58 mg/dL — ABNORMAL LOW (ref 0.61–1.24)
GFR, Estimated: >60 mL/min (ref >60)
Glucose, Bld: 186 mg/dL — ABNORMAL HIGH (ref 70–99)
Potassium: 3.3 mmol/L — ABNORMAL LOW (ref 3.5–5.1)
Sodium: 130 mmol/L — ABNORMAL LOW (ref 135–145)
Total Bilirubin: 5 mg/dL — ABNORMAL HIGH (ref 0.3–1.2)
Total Protein: 7.2 g/dL (ref 6.5–8.1)

## 2020-09-21 LAB — LEVETIRACETAM LEVEL: Levetiracetam Lvl: 7 ug/mL — ABNORMAL LOW (ref 10.0–40.0)

## 2020-09-21 LAB — GLUCOSE, CAPILLARY
Glucose-Capillary: 211 mg/dL — ABNORMAL HIGH (ref 70–99)
Glucose-Capillary: 235 mg/dL — ABNORMAL HIGH (ref 70–99)
Glucose-Capillary: 273 mg/dL — ABNORMAL HIGH (ref 70–99)
Glucose-Capillary: 95 mg/dL (ref 70–99)

## 2020-09-21 LAB — CBC
HCT: 26.1 % — ABNORMAL LOW (ref 39.0–52.0)
Hemoglobin: 8.7 g/dL — ABNORMAL LOW (ref 13.0–17.0)
MCH: 27.5 pg (ref 26.0–34.0)
MCHC: 33.3 g/dL (ref 30.0–36.0)
MCV: 82.6 fL (ref 80.0–100.0)
Platelets: 76 10*3/uL — ABNORMAL LOW (ref 150–400)
RBC: 3.16 MIL/uL — ABNORMAL LOW (ref 4.22–5.81)
RDW: 22.5 % — ABNORMAL HIGH (ref 11.5–15.5)
WBC: 6.8 10*3/uL (ref 4.0–10.5)
nRBC: 0 % (ref 0.0–0.2)

## 2020-09-21 LAB — MAGNESIUM: Magnesium: 1.3 mg/dL — ABNORMAL LOW (ref 1.7–2.4)

## 2020-09-21 LAB — PHOSPHORUS: Phosphorus: 2.9 mg/dL (ref 2.5–4.6)

## 2020-09-21 MED ORDER — LACTULOSE 10 GM/15ML PO SOLN: 30.0000 g | Freq: Three times a day (TID) | ORAL | 0 refills | Status: DC

## 2020-09-21 MED ORDER — FERROUS SULFATE 325 (65 FE) MG PO TABS: 325.0000 mg | ORAL_TABLET | Freq: Every day | ORAL | 0 refills | Status: DC

## 2020-09-21 MED ORDER — POTASSIUM CHLORIDE 20 MEQ PO PACK
40.0000 meq | PACK | Freq: Once | ORAL | Status: AC
  Administered 2020-09-21: 40 meq via ORAL
  Filled 2020-09-21: qty 2

## 2020-09-21 MED ORDER — LEVETIRACETAM 500 MG PO TABS: 500.0000 mg | ORAL_TABLET | Freq: Two times a day (BID) | ORAL | 0 refills | Status: DC

## 2020-09-21 MED ORDER — PANTOPRAZOLE SODIUM 40 MG PO TBEC: 40.0000 mg | DELAYED_RELEASE_TABLET | Freq: Every day | ORAL | 0 refills | Status: DC

## 2020-09-21 MED ORDER — FOLIC ACID 1 MG PO TABS: 1.0000 mg | ORAL_TABLET | Freq: Every day | ORAL | 0 refills | Status: DC

## 2020-09-21 MED ORDER — FERROUS SULFATE 325 (65 FE) MG PO TABS: 325.0000 mg | ORAL_TABLET | Freq: Every day | ORAL | Status: DC

## 2020-09-21 MED ORDER — THIAMINE HCL 100 MG PO TABS: 100.0000 mg | ORAL_TABLET | Freq: Every day | ORAL | 0 refills | Status: DC

## 2020-09-21 MED ORDER — MAGNESIUM SULFATE 4 GM/100ML IV SOLN
4.0000 g | Freq: Once | INTRAVENOUS | Status: AC
  Administered 2020-09-21: 4 g via INTRAVENOUS
  Filled 2020-09-21: qty 100

## 2020-09-21 NOTE — Progress Notes (Addendum)
Daily Rounding Note  09/21/2020, 11:18 AM  LOS: 4 days   SUBJECTIVE:   Chief complaint:  Melenic stool.      Feeling well.  No abdominal pain.  Loose stools, at least 6 yesterday so does not want to take lactulose today.  Reinforced need for this med w goal of 3 daily BM's, not sure pt really "gets it".  No nausea.  Tolerating solids   OBJECTIVE:         Vital signs in last 24 hours:    Temp:  [97.6 F (36.4 C)-98.2 F (36.8 C)] 97.9 F (36.6 C) (03/17 0700) Pulse Rate:  [86-95] 95 (03/17 0700) Resp:  [16-18] 18 (03/17 0700) BP: (111-141)/(74-103) 129/98 (03/17 0700) SpO2:  [96 %-100 %] 97 % (03/17 0700) Last BM Date: 09/20/20 Filed Weights   09/20/20 1000  Weight: 70.8 kg   General: Looks chronically unwell but alert, cooperative, comfortable. Heart: RRR, NSR in 90s on telemetry monitor. Chest: No labored breathing or cough Abdomen: Tender, nondistended.  Active bowel Extremities: No CCE. Neuro/Psych: No asterixis.  Oriented x3.  Appropriate.  No tremors or involuntary movements  Intake/Output from previous day: 03/16 0701 - 03/17 0700 In: 1000 [P.O.:240; I.V.:410; IV Piggyback:350] Out: -   Intake/Output this shift: No intake/output data recorded.  Lab Results: Recent Labs    09/19/20 0305 09/20/20 0414 09/21/20 0435  WBC 7.9 6.8 6.8  HGB 8.5* 8.3* 8.7*  HCT 24.6* 23.7* 26.1*  PLT 71* 75* 76*   BMET Recent Labs    09/19/20 0305 09/20/20 0414 09/21/20 0435  NA 131* 134* 130*  K 3.0* 3.1* 3.3*  CL 98 104 100  CO2 21* 21* 20*  GLUCOSE 101* 111* 186*  BUN <5* <5* <5*  CREATININE 0.62 0.56* 0.58*  CALCIUM 7.5* 8.0* 8.4*   LFT Recent Labs    09/19/20 0305 09/20/20 0414 09/21/20 0435  PROT 6.9 6.7 7.2  ALBUMIN 2.6* 2.6* 2.7*  AST 62* 46* 43*  ALT 18 15 14   ALKPHOS 151* 136* 157*  BILITOT 5.9* 4.8* 5.0*   PT/INR Recent Labs    09/18/20 1334  LABPROT 16.9*  INR 1.4*   Hepatitis  Panel No results for input(s): HEPBSAG, HCVAB, HEPAIGM, HEPBIGM in the last 72 hours.  Studies/Results: No results found.   Scheduled Meds: . sodium chloride   Intravenous Once  . folic acid  1 mg Oral Daily  . insulin aspart  0-9 Units Subcutaneous Q4H  . lactulose  30 g Oral TID  . multivitamin with minerals  1 tablet Oral Daily  . pantoprazole  40 mg Oral Q0600  . sodium chloride flush  10-40 mL Intracatheter Q12H  . [START ON 09/26/2020] thiamine  100 mg Oral Daily   Continuous Infusions: . cefTRIAXone (ROCEPHIN)  IV 2 g (09/21/20 0520)  . levETIRAcetam 500 mg (09/20/20 2152)  . octreotide  (SANDOSTATIN)    IV infusion 50 mcg/hr (09/21/20 0410)  . thiamine injection     PRN Meds:.acetaminophen, LORazepam, ondansetron **OR** ondansetron (ZOFRAN) IV, sodium chloride flush   ASSESMENT:   *   Melena.   3/17 EGD: Non bleeding grade 1 esoph varices.  irreg z line.  Portal  Gastropathy w/o bleeding.  No gastric varices.  Deformed pre-pylorus s/o previous PUD.  No biopsies.    *    acute on chronic anemia.  Hgb 6.7 >> PRBCs >> 7.6.  MCV 82.  Low iron.  Borderline low ferritin.  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 Ongoing ETOH abuse.    *   IDDM.    *   HE.    *   Seizures.  CT head s/o CVA but no CVA on MRI.    *   Hx mild ETOH pancreatitis 11/2019.   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    *    Thrombocytopenia.  Platelets stable in 70s.  *    hypokalemia, improved from 3 >> 3.3 but not yet resolved.  *    Hyponatremia. Na 125 >> 130.    *    Coagulopathy.  INR 1.4.   PLAN   *   Continue Protonix 40 mg po daily.   Stopped Octreotide.  No need for outpt abx.   Continue Lactulose, titrate dose for 3 to 5 BM's per day.   ROV Dr  Ardis Hughs may 11, 9 AM.  Dr Ardis Hughs can decide re initiating non-selective BB.   Added iron 325 mg po daily.  If anemia persists may benefit from Miston. Overall prognosis for  patient not optimistic as he has little insight into his disease or need for compliance with meds.  Patient needs to see Dr. Margarita Rana his PCP within the next 2 to 3 weeks with recheck of chemistries, CBC.    Azucena Freed  09/21/2020, 11:18 AM Phone [phone]  GI ATTENDING  Interval history data reviewed.  Patient seen and examined.  Agree with comprehensive consultation note as outlined above without additions or deletions.  Please follow the recommendations as outlined above.  Outpatient GI follow-up arranged.  Nothing further to add at this time.  We will sign off.  Docia Chuck. Geri Seminole., M.D. [facility] Division of Gastroenterology

## 2020-09-21 NOTE — Discharge Instructions (Signed)
Alcohol Abuse and Dependence Information, Adult Alcohol is a widely available drug. People drink alcohol in different amounts. People who drink alcohol very often and in large amounts often have problems during and after drinking. They may develop what is called an alcohol use disorder. There are two main types of alcohol use disorders:  Alcohol abuse. This is when you use alcohol too much or too often. You may use alcohol to make yourself feel happy or to reduce stress. You may have a hard time setting a limit on the amount you drink.  Alcohol dependence. This is when you use alcohol consistently for a period of time, and your body changes as a result. This can make it hard to stop drinking because you may start to feel sick or feel different when you do not use alcohol. These symptoms are known as withdrawal. How can alcohol abuse and dependence affect me? Alcohol abuse and dependence can have a negative effect on your life. Drinking too much can lead to addiction. You may feel like you need alcohol to function normally. You may drink alcohol before work in the morning, during the day, or as soon as you get home from work in the evening. These actions can result in:  Poor work performance.  Job loss.  Financial problems.  Car crashes or criminal charges from driving after drinking alcohol.  Problems in your relationships with friends and family.  Losing the trust and respect of coworkers, friends, and family. Drinking heavily over a long period of time can permanently damage your body and brain, and can cause lifelong health issues, such as:  Damage to your liver or pancreas.  Heart problems, high blood pressure, or stroke.  Certain cancers.  Decreased ability to fight infections.  Brain or nerve damage.  Depression.  Early (premature) death. If you are careless or you crave alcohol, it is easy to drink more than your body can handle (overdose). Alcohol overdose is a serious  situation that requires hospitalization. It may lead to permanent injuries or death. What can increase my risk?  Having a family history of alcohol abuse.  Having depression or other mental health conditions.  Beginning to drink at an early age.  Binge drinking often.  Experiencing trauma, stress, and an unstable home life during childhood.  Spending time with people who drink often. What actions can I take to prevent or manage alcohol abuse and dependence?  Do not drink alcohol if: ? Your health care provider tells you not to drink. ? You are pregnant, may be pregnant, or are planning to become pregnant.  If you drink alcohol: ? Limit how much you use to:  0-1 drink a day for women.  0-2 drinks a day for men. ? Be aware of how much alcohol is in your drink. In the [place] one drink equals one 12 oz bottle of beer (355 mL), one 5 oz glass of wine (148 mL), or one 1 oz glass of hard liquor (44 mL).  Stop drinking if you have been drinking too much. This can be very hard to do if you are used to abusing alcohol. If you begin to have withdrawal symptoms, talk with your health care provider or a person that you trust. These symptoms may include anxiety, shaky hands, headache, nausea, sweating, or not being able to sleep.  Choose to drink nonalcoholic beverages in social gatherings and places where there may be alcohol. Activity  Spend more time on activities that you enjoy that do   not involve alcohol, like hobbies or exercise.  Find healthy ways to cope with stress, such as exercise, meditation, or spending time with people you care about. General information  Talk to your family, coworkers, and friends about supporting you in your efforts to stop drinking. If they drink, ask them not to drink around you. Spend more time with people who do not drink alcohol.  If you think that you have an alcohol dependency problem: ? Tell friends or family about your concerns. ? Talk with your  health care provider or another health professional about where to get help. ? Work with a Transport planner and a Regulatory affairs officer. ? Consider joining a support group for people who struggle with alcohol abuse and dependence. Where to find support  Your health care provider.  SMART Recovery: www.smartrecovery.org Therapy and support groups  Local treatment centers or chemical dependency counselors.  Local AA groups in your community: [employer].com.pt   Where to find more information  Centers for Disease Control and Prevention: http://www.wolf.info/  National Institute on Alcohol Abuse and Alcoholism: http://www.bradshaw.com/  Alcoholics Anonymous (AA): [employer]com.pt Contact a health care provider if:  You drank more or for longer than you intended on more than one occasion.  You tried to stop drinking or to cut back on how much you drink, but you were not able to.  You often drink to the point of vomiting or passing out.  You want to drink so badly that you cannot think about anything else.  You have problems in your life due to drinking, but you continue to drink.  You keep drinking even though you feel anxious, depressed, or have experienced memory loss.  You have stopped doing the things you used to enjoy in order to drink.  You have to drink more than you used to in order to get the effect you want.  You experience anxiety, sweating, nausea, shakiness, and trouble sleeping when you try to stop drinking. Get help right away if:  You have thoughts about hurting yourself or others.  You have serious withdrawal symptoms, including: ? Confusion. ? Racing heart. ? High blood pressure. ? Fever. If you ever feel like you may hurt yourself or others, or have thoughts about taking your own life, get help right away. You can go to your nearest emergency department or call:  Your local emergency services (911 in the [place]  A suicide crisis helpline, such as the [facility] at (614) 505-2892. This is open 24 hours a day. Summary  Alcohol abuse and dependence can have a negative effect on your life. Drinking too much or too often can lead to addiction.  If you drink alcohol, limit how much you use.  If you are having trouble keeping your drinking under control, find ways to change your behavior. Hobbies, calming activities, exercise, or support groups can help.  If you feel you need help with changing your drinking habits, talk with your health care provider, a good friend, or a therapist, or go to an [employer] group. This information is not intended to replace advice given to you by your health care provider. Make sure you discuss any questions you have with your health care provider. Document Revised: 10/13/2018 Document Reviewed: 09/01/2018 Elsevier Patient Education  [place].

## 2020-09-21 NOTE — TOC Transition Note (Signed)
Transition of Care [facility]) - CM/SW Discharge Note   Patient Details  Name: Gary Mitchell MRN: [id number] Date of Birth: 1971-05-20  [facility] CM/SW Contact:  Pollie Friar, RN Phone Number: 09/21/2020, 3:44 PM   Clinical Narrative:    Patient is discharging home with self care. Pt states he has transportation home.    Final next level of care: Home/Self Care Barriers to Discharge: No Barriers Identified   Patient Goals and CMS Choice        Discharge Placement                       Discharge Plan and Services   Discharge Planning Services: CM Consult                                 Social Determinants of Health (SDOH) Interventions     Readmission Risk Interventions Readmission Risk Prevention Plan 11/24/2019  Post Dischage Appt Complete  Medication Screening Complete  Transportation Screening Complete  Some recent data might be hidden

## 2020-09-21 NOTE — Plan of Care (Signed)
  Problem: Education: °Goal: Expressions of having a comfortable level of knowledge regarding the disease process will increase °Outcome: Adequate for Discharge °  °Problem: Coping: °Goal: Ability to adjust to condition or change in health will improve °Outcome: Adequate for Discharge °Goal: Ability to identify appropriate support needs will improve °Outcome: Adequate for Discharge °  °Problem: Health Behavior/Discharge Planning: °Goal: Compliance with prescribed medication regimen will improve °Outcome: Adequate for Discharge °  °Problem: Medication: °Goal: Risk for medication side effects will decrease °Outcome: Adequate for Discharge °  °Problem: Clinical Measurements: °Goal: Complications related to the disease process, condition or treatment will be avoided or minimized °Outcome: Adequate for Discharge °Goal: Diagnostic test results will improve °Outcome: Adequate for Discharge °  °Problem: Safety: °Goal: Verbalization of understanding the information provided will improve °Outcome: Adequate for Discharge °  °Problem: Self-Concept: °Goal: Level of anxiety will decrease °Outcome: Adequate for Discharge °Goal: Ability to verbalize feelings about condition will improve °Outcome: Adequate for Discharge °  °

## 2020-09-21 NOTE — Progress Notes (Signed)
Patient is ready for discharge, leaving via wheelchair and by family transport. Discharge instructions reviewed and given to patient.

## 2020-09-21 NOTE — Anesthesia Postprocedure Evaluation (Signed)
Anesthesia Post Note  Patient: Gary Mitchell  Procedure(s) Performed: ENTEROSCOPY (N/A )     Patient location during evaluation: Endoscopy Anesthesia Type: MAC Level of consciousness: awake and alert Pain management: pain level controlled Vital Signs Assessment: post-procedure vital signs reviewed and stable Respiratory status: spontaneous breathing, nonlabored ventilation, respiratory function stable and patient connected to nasal cannula oxygen Cardiovascular status: stable and blood pressure returned to baseline Postop Assessment: no apparent nausea or vomiting Anesthetic complications: no   No complications documented.  Last Vitals:  Vitals:   09/21/20 0348 09/21/20 0700  BP: 121/86 (!) 129/98  Pulse: 92 95  Resp:  18  Temp: 36.8 C 36.6 C  SpO2: 98% 97%    Last Pain:  Vitals:   09/21/20 0700  TempSrc: Oral  PainSc:                  [place]

## 2020-09-21 NOTE — Discharge Summary (Signed)
Physician Discharge Summary  Gary Mitchell TDV:[id number] DOB: March 06, 1971 DOA: 09/17/2020  PCP: Charlott Rakes, MD  Admit date: 09/17/2020 Discharge date: 09/21/2020  Admitted From: home Disposition:  home  Recommendations for Outpatient Follow-up:  1. Follow up with PCP in 1-2 weeks 2. Follow-up with gastroenterology as scheduled  Home Health: none Equipment/Devices: none  Discharge Condition: stable CODE STATUS: Full code Diet recommendation: regular  HPI: Per admitting MD, Gary Mitchell is a 50 y.o. male with medical history significant of alcohol abuse, alcoholic liver disease, seizure disorder on Keppra, recent COVID-19 pneumonia in January, GERD, anemia of chronic disease who drinks excessively apparently staying with his mother.  Mother is not aware or other nausea of the MRI but sees a lot of empty bottles.  She was at work today when patient had multiple seizures.  Apparently had 4 seizures prior to arrival of EMS.  He then had witnessed seizures x3 by EMS.  He is currently obtunded not able to communicate.  He was supposed to be on antiseizure medications but levels were checked and nothing was in his system.  Mother said he has been having vomiting for the last several days and has not taken his medications.  Patient was also found to have ammonia level of more than 400.  His drug screen is only positive for benzodiazepine.  Alcohol level currently pending but has been less than 10 previously.  Head CT showed possible acute CVA.  Subsequent MRI attempted but patient unable to go through with it due to agitation.  He has been admitted therefore to the hospital for further evaluation and treatment.  Hospital Course / Discharge diagnoses: Principal Problem Recurrent seizures-reported prior to admission, neurology consulted and followed patient while hospitalized.  Based on recommendations, patient replaced on Keppra 500 mg twice daily and neurology recommends this to be  continued as an outpatient.  He has remained seizure-free most important thing moving forward is for him to also remain alcohol free.  This was discussed with the patient.  He was counseled for no driving for 6 months of being seizure-free  Active Problems Hepatic encephalopathy, alcoholic cirrhosis-continue lactulose, mental status gradually improving.-Abdominal ultrasound without ascites.  GI consulted and evaluated patient while hospitalized.  He underwent an EGD on 3/16 which showed grade 1 esophageal varices with no bleeding and no stigmata of recent bleeding.  It also showed portal hypertensive gastropathy without gastric varices.  There was no evidence for active or recent bleeding.  His diet was advanced, tolerating regular diet and will be discharged home in stable condition  Acute blood loss anemia, concern for GI bleed-GI consulted, he will get an endoscopy today.  He is on PPI, octreotide, ceftriaxone.  Has received a unit of packed red blood cells on 3/14.  Hemoglobin has remained stable, will be placed on iron home discharge Hypokalemia, hypomagnesemia, hypophosphatemia-repleted prior to discharge, he was recommended to start a multivitamin Recent Covid infection, January 2022-noted  Sepsis ruled out   Discharge Instructions   Allergies as of 09/21/2020      Reactions   Iohexol Other (See Comments)   Unknown reaction at 72 days old      Medication List    STOP taking these medications   phenytoin 100 MG ER capsule Commonly known as: Dilantin     TAKE these medications   ferrous sulfate 325 (65 FE) MG tablet Take 1 tablet (325 mg total) by mouth daily with breakfast. Start taking on: September 22, 2020  folic acid 1 MG tablet Commonly known as: FOLVITE Take 1 tablet (1 mg total) by mouth daily. Start taking on: September 22, 2020   lactulose 10 GM/15ML solution Commonly known as: CHRONULAC Take 45 mLs (30 g total) by mouth 3 (three) times daily.   levETIRAcetam 500 MG  tablet Commonly known as: Keppra Take 1 tablet (500 mg total) by mouth 2 (two) times daily.   pantoprazole 40 MG tablet Commonly known as: PROTONIX Take 1 tablet (40 mg total) by mouth daily.   thiamine 100 MG tablet Take 1 tablet (100 mg total) by mouth daily. Start taking on: September 26, 2020   vitamin k 100 MCG tablet Take 1 tablet (100 mcg total) by mouth daily. For 60 days per patient       Follow-up Information    Milus Banister, MD Follow up on 11/15/2020.   Specialty: Gastroenterology Why: 9 AM.   Contact information: [address] [place] [address] [phone]               Consultations:  GI  Procedures/Studies:  EGD 3/16  CT HEAD WO CONTRAST  Result Date: 09/17/2020 CLINICAL DATA:  Seizure EXAM: CT HEAD WITHOUT CONTRAST TECHNIQUE: Contiguous axial images were obtained from the base of the skull through the vertex without intravenous contrast. COMPARISON:  September 30, 2014 FINDINGS: Brain: Ventricles and sulci are normal in size and configuration. There is no intracranial mass, hemorrhage, extra-axial fluid collection, or midline shift. There is decreased attenuation in the mid right frontal lobe, a finding concerning for potential acute infarct in this area. Elsewhere brain parenchyma appears unremarkable. No evident acute infarct. Vascular: No hyperdense vessel. There is slight calcification in each carotid siphon region. Skull: Bony calvarium appears intact. Sinuses/Orbits: There is a retention cyst in the superior right maxillary antrum. Paranasal sinuses are clear. Orbits appear symmetric bilaterally. Other: Mastoid air cells are clear. IMPRESSION: Decreased attenuation in the right frontal lobe midportion concerning for recent and potentially acute infarct. Elsewhere brain parenchyma appears unremarkable. No mass or hemorrhage appreciable. Mild arterial vascular calcification noted. Retention cyst in right maxillary antrum. 2325 Electronically Signed    By: Lowella Grip III M.D.   On: 09/17/2020 16:26   MR BRAIN WO CONTRAST  Result Date: 09/18/2020 CLINICAL DATA:  Seizure. EXAM: MRI HEAD WITHOUT CONTRAST TECHNIQUE: Multiplanar, multiecho pulse sequences of the brain and surrounding structures were obtained without intravenous contrast. COMPARISON:  Head CT yesterday. FINDINGS: Brain: The patient was uncooperative and would not tolerate complete imaging. There is a great deal of motion degradation. These factors make accurate interpretation difficulty. There is some apparent bright signal on diffusion imaging affecting the brainstem and cerebral peduncles, but I think this is probably artifactual. There is scattered foci of abnormal T2 and FLAIR signal within the cerebral hemispheric white matter suggesting old small vessel disease. No evidence of mass lesion, hemorrhage, hydrocephalus or extra-axial collection. Mesial temporal lobes appear symmetric without definable lesion. Vascular: Major vessels at the base of the brain show flow. Skull and upper cervical spine: Negative Sinuses/Orbits: Clear except for a right maxillary sinus retention cyst. Orbits negative. Other: None IMPRESSION: Abbreviated and motion degraded exam. No acute or reversible finding. Mild chronic small-vessel ischemic change of the cerebral hemispheric white matter. No specific abnormality seen to explain seizure. Diffusion imaging shows some high signal in the brainstem and cerebral peduncles and lower radiating white matter tracts, but does not show true restricted diffusion on the ADC map and therefore I  think the finding is artifactual. Electronically Signed   By: Nelson Chimes M.D.   On: 09/18/2020 18:26   DG Chest Port 1 View  Result Date: 09/17/2020 CLINICAL DATA:  Seizure. EXAM: PORTABLE CHEST 1 VIEW COMPARISON:  July 09, 2020 FINDINGS: The cardiomediastinal silhouette is stable. No focal infiltrate. No pneumothorax. Healed left rib fracture. IMPRESSION: No active disease.  Electronically Signed   By: Dorise Bullion III M.D   On: 09/17/2020 16:00   EEG adult  Result Date: 09/18/2020 Lora Havens, MD     09/18/2020 11:42 AM Patient Name: HAROON SHATTO MRN: [id number] Epilepsy Attending: Lora Havens Referring Physician/Provider: Clance Boll, NP Date: 09/18/2020 Duration: 25.45 mins Patient history: 50 year old male with history of epilepsy presented with altered mental status.  EEG to evaluate for seizures. Level of alertness: Awake, asleep AEDs during EEG study: Keppra Technical aspects: This EEG study was done with scalp electrodes positioned according to the 10-20 International system of electrode placement. Electrical activity was acquired at a sampling rate of 500Hz  and reviewed with a high frequency filter of 70Hz  and a low frequency filter of 1Hz . EEG data were recorded continuously and digitally stored. Description: The posterior dominant rhythm consists of 8-9 Hz activity of moderate voltage (25-35 uV) seen predominantly in posterior head regions, symmetric and reactive to eye opening and eye closing. Sleep was characterized by vertex waves, sleep spindles (12 to 14 Hz), maximal frontocentral region.  EEG showed continuous generalized 3 to 6 Hz theta-delta slowing.  Hyperventilation and photic stimulation were not performed.   ABNORMALITY -Continuous slow, generalized IMPRESSION: This study is suggestive of mild to moderate diffuse encephalopathy, nonspecific etiology. No seizures or epileptiform discharges were seen throughout the recording. Priyanka Barbra Sarks   [place] ASCITES (ABDOMEN LIMITED)  Result Date: 09/18/2020 CLINICAL DATA:  Cirrhosis.  Evaluate for ascites EXAM: LIMITED ABDOMEN ULTRASOUND FOR ASCITES TECHNIQUE: Limited ultrasound survey for ascites was performed in all four abdominal quadrants. COMPARISON:  None. FINDINGS: Evaluation of the 4 quadrants of the abdomen demonstrates no measurable ascites. IMPRESSION: No ascites. Electronically Signed    By: Suzy Bouchard M.D.   On: 09/18/2020 16:16      Subjective: - no chest pain, shortness of breath, no abdominal pain, nausea or vomiting.   Discharge Exam: BP (!) 124/91 (BP Location: Right Arm)   Pulse 93   Temp 98.2 F (36.8 C) (Oral)   Resp 16   Ht 5\' 6"  (1.676 m)   Wt 70.8 kg   SpO2 100%   BMI 25.18 kg/m   General: Pt is alert, awake, not in acute distress Cardiovascular: RRR, S1/S2 +, no rubs, no gallops Respiratory: CTA bilaterally, no wheezing, no rhonchi Abdominal: Soft, NT, ND, bowel sounds + Extremities: no edema, no cyanosis   The results of significant diagnostics from this hospitalization (including imaging, microbiology, ancillary and laboratory) are listed below for reference.     Microbiology: No results found for this or any previous visit (from the past 240 hour(s)).   Labs: Basic Metabolic Panel: Recent Labs  Lab 09/17/20 1545 09/17/20 1606 09/18/20 0217 09/19/20 0305 09/20/20 0414 09/21/20 0435  NA 125* 127* 130* 131* 134* 130*  K 2.7* 2.7* 2.8* 3.0* 3.1* 3.3*  CL 86* 88* 96* 98 104 100  CO2 11*  --  21* 21* 21* 20*  GLUCOSE 254* 257* 206* 101* 111* 186*  BUN 6 6 5* <5* <5* <5*  CREATININE 0.79 0.40* 0.64 0.62 0.56* 0.58*  CALCIUM 8.5*  --  7.7* 7.5* 8.0* 8.4*  MG 1.5*  --  1.6* 1.2* 1.6* 1.3*  PHOS  --   --  1.5* 2.1* 2.8 2.9   Liver Function Tests: Recent Labs  Lab 09/17/20 1545 09/18/20 0217 09/19/20 0305 09/20/20 0414 09/21/20 0435  AST 107* 87* 62* 46* 43*  ALT 24 21 18 15 14   ALKPHOS 162* 152* 151* 136* 157*  BILITOT 6.8* 6.5* 5.9* 4.8* 5.0*  PROT 7.7 6.7 6.9 6.7 7.2  ALBUMIN 3.0* 2.6* 2.6* 2.6* 2.7*   CBC: Recent Labs  Lab 09/17/20 1545 09/17/20 1606 09/18/20 0217 09/18/20 1919 09/19/20 0305 09/20/20 0414 09/21/20 0435  WBC 7.3  --  7.6  --  7.9 6.8 6.8  NEUTROABS 4.9  --   --   --  4.4 3.4  --   HGB 7.6*   < > 6.7* 9.2* 8.5* 8.3* 8.7*  HCT 23.8*   < > 19.2* 27.9* 24.6* 23.7* 26.1*  MCV 84.1  --  78.4*   --  80.4 81.4 82.6  PLT 69*  --  69*  --  71* 75* 76*   < > = values in this interval not displayed.   CBG: Recent Labs  Lab 09/20/20 2023 09/21/20 0004 09/21/20 0350 09/21/20 0718 09/21/20 1116  GLUCAP 131* 235* 211* 95 273*   Hgb A1c No results for input(s): HGBA1C in the last 72 hours. Lipid Profile No results for input(s): CHOL, HDL, LDLCALC, TRIG, CHOLHDL, LDLDIRECT in the last 72 hours. Thyroid function studies Recent Labs    09/19/20 0305  TSH 0.355   Urinalysis    Component Value Date/Time   COLORURINE AMBER (A) 09/17/2020 1545   APPEARANCEUR CLEAR 09/17/2020 1545   LABSPEC 1.018 09/17/2020 1545   PHURINE 6.0 09/17/2020 1545   GLUCOSEU 150 (A) 09/17/2020 1545   HGBUR NEGATIVE 09/17/2020 1545   BILIRUBINUR SMALL (A) 09/17/2020 1545   KETONESUR 80 (A) 09/17/2020 1545   PROTEINUR 30 (A) 09/17/2020 1545   NITRITE NEGATIVE 09/17/2020 1545   LEUKOCYTESUR NEGATIVE 09/17/2020 1545    FURTHER DISCHARGE INSTRUCTIONS:   Get Medicines reviewed and adjusted: Please take all your medications with you for your next visit with your Primary MD   Laboratory/radiological data: Please request your Primary MD to go over all hospital tests and procedure/radiological results at the follow up, please ask your Primary MD to get all Hospital records sent to his/her office.   In some cases, they will be blood work, cultures and biopsy results pending at the time of your discharge. Please request that your primary care M.D. goes through all the records of your hospital data and follows up on these results.   Also Note the following: If you experience worsening of your admission symptoms, develop shortness of breath, life threatening emergency, suicidal or homicidal thoughts you must seek medical attention immediately by calling 911 or calling your MD immediately  if symptoms less severe.   You must read complete instructions/literature along with all the possible adverse  reactions/side effects for all the Medicines you take and that have been prescribed to you. Take any new Medicines after you have completely understood and accpet all the possible adverse reactions/side effects.    Do not drive when taking Pain medications or sleeping medications (Benzodaizepines)   Do not take more than prescribed Pain, Sleep and Anxiety Medications. It is not advisable to combine anxiety,sleep and pain medications without talking with your primary care practitioner   Special Instructions: If you have smoked or chewed Tobacco  in the last 2 yrs please stop smoking, stop any regular Alcohol  and or any Recreational drug use.   Wear Seat belts while driving.   Please note: You were cared for by a hospitalist during your hospital stay. Once you are discharged, your primary care physician will handle any further medical issues. Please note that NO REFILLS for any discharge medications will be authorized once you are discharged, as it is imperative that you return to your primary care physician (or establish a relationship with a primary care physician if you do not have one) for your post hospital discharge needs so that they can reassess your need for medications and monitor your lab values.  Time coordinating discharge: 40 minutes  SIGNED:  Marzetta Board, MD, PhD 09/21/2020, 1:36 PM

## 2020-09-22 ENCOUNTER — Encounter (HOSPITAL_COMMUNITY): Payer: Self-pay | Admitting: Gastroenterology

## 2020-09-22 ENCOUNTER — Telehealth: Payer: Self-pay

## 2020-09-22 NOTE — Telephone Encounter (Signed)
  Transition Care Management Follow-up Telephone Call  Date of discharge and from where:[facility] on 30/17/2022  How have you been since you were released from the hospital? ok Any questions or concerns? No questions/concerns reported.  Items Reviewed: Did the pt receive and understand the discharge instructions provided? Stated that have the instructions and have no questions.  Medications obtained and verified? He said he said  have the medication list.  He said that he has all of the medications and they have no questions.  Any new allergies since your discharge? None reported  Do you have support at home? Yes, mom Other (ie: DME, Home Health, etc)       Functional Questionnaire: (I = Independent and D = Dependent) ADL's:  Independent.      Follow up appointments reviewed:   PCP Hospital f/u appt confirmed? Dr Margarita Rana on 10/03/2020 @ 1050.  [facility] f/u appt confirmed? Gastro scheduled at this time on 11/15/2020 Are transportation arrangements needed? He  have transportation   If their condition worsens, is the pt aware to call  their PCP or go to the ED? Yes.Made pt aware if condition worsen or start experiencing rapid weight gain, chest pain, diff breathing, SOB, high fevers, or bleading to refer imediately to ED for further evaluation.  Was the patient provided with contact information for the PCP's office or ED? He has the phone number  Was the pt encouraged to call back with questions or concerns?yes

## 2020-10-03 ENCOUNTER — Ambulatory Visit: Payer: 59 | Attending: Family Medicine | Admitting: Family Medicine

## 2020-10-03 ENCOUNTER — Other Ambulatory Visit: Payer: Self-pay

## 2020-10-03 ENCOUNTER — Other Ambulatory Visit: Payer: Self-pay | Admitting: Family Medicine

## 2020-10-03 VITALS — BP 114/78 | HR 96 | Ht 66.0 in | Wt 142.2 lb

## 2020-10-03 DIAGNOSIS — D649 Anemia, unspecified: Secondary | ICD-10-CM | POA: Diagnosis not present

## 2020-10-03 DIAGNOSIS — K8689 Other specified diseases of pancreas: Secondary | ICD-10-CM | POA: Diagnosis not present

## 2020-10-03 DIAGNOSIS — K709 Alcoholic liver disease, unspecified: Secondary | ICD-10-CM | POA: Diagnosis not present

## 2020-10-03 DIAGNOSIS — F1010 Alcohol abuse, uncomplicated: Secondary | ICD-10-CM

## 2020-10-03 DIAGNOSIS — R569 Unspecified convulsions: Secondary | ICD-10-CM | POA: Diagnosis not present

## 2020-10-03 MED ORDER — FOLIC ACID 1 MG PO TABS: 1.0000 mg | ORAL_TABLET | Freq: Every day | ORAL | 3 refills | Status: DC

## 2020-10-03 MED ORDER — PANTOPRAZOLE SODIUM 40 MG PO TBEC: 40.0000 mg | DELAYED_RELEASE_TABLET | Freq: Every day | ORAL | 3 refills | Status: DC

## 2020-10-03 MED ORDER — FERROUS SULFATE 325 (65 FE) MG PO TABS: 325.0000 mg | ORAL_TABLET | Freq: Every day | ORAL | 0 refills | Status: DC

## 2020-10-03 MED ORDER — LACTULOSE 10 GM/15ML PO SOLN: 30.0000 g | Freq: Three times a day (TID) | ORAL | 0 refills | Status: DC

## 2020-10-03 MED ORDER — NALTREXONE HCL 50 MG PO TABS: 50.0000 mg | ORAL_TABLET | Freq: Every day | ORAL | 3 refills | Status: DC

## 2020-10-03 MED ORDER — LEVETIRACETAM 500 MG PO TABS: 500.0000 mg | ORAL_TABLET | Freq: Two times a day (BID) | ORAL | 3 refills | Status: DC

## 2020-10-03 MED ORDER — THIAMINE HCL 100 MG PO TABS: 100.0000 mg | ORAL_TABLET | Freq: Every day | ORAL | 3 refills | Status: DC

## 2020-10-03 MED FILL — LACTULOSE 10 GM/15 ML SOLN: 10 | 2 days supply | Qty: 236 | Fill #0

## 2020-10-03 MED FILL — NALTREXONE 50 MG TABLET: 50 | 30 days supply | Qty: 30 | Fill #0

## 2020-10-03 NOTE — Patient Instructions (Signed)
Alcohol Abuse and Dependence Information, Adult Alcohol is a widely available drug. People drink alcohol in different amounts. People who drink alcohol very often and in large amounts often have problems during and after drinking. They may develop what is called an alcohol use disorder. There are two main types of alcohol use disorders:  Alcohol abuse. This is when you use alcohol too much or too often. You may use alcohol to make yourself feel happy or to reduce stress. You may have a hard time setting a limit on the amount you drink.  Alcohol dependence. This is when you use alcohol consistently for a period of time, and your body changes as a result. This can make it hard to stop drinking because you may start to feel sick or feel different when you do not use alcohol. These symptoms are known as withdrawal. How can alcohol abuse and dependence affect me? Alcohol abuse and dependence can have a negative effect on your life. Drinking too much can lead to addiction. You may feel like you need alcohol to function normally. You may drink alcohol before work in the morning, during the day, or as soon as you get home from work in the evening. These actions can result in:  Poor work performance.  Job loss.  Financial problems.  Car crashes or criminal charges from driving after drinking alcohol.  Problems in your relationships with friends and family.  Losing the trust and respect of coworkers, friends, and family. Drinking heavily over a long period of time can permanently damage your body and brain, and can cause lifelong health issues, such as:  Damage to your liver or pancreas.  Heart problems, high blood pressure, or stroke.  Certain cancers.  Decreased ability to fight infections.  Brain or nerve damage.  Depression.  Early (premature) death. If you are careless or you crave alcohol, it is easy to drink more than your body can handle (overdose). Alcohol overdose is a serious  situation that requires hospitalization. It may lead to permanent injuries or death. What can increase my risk?  Having a family history of alcohol abuse.  Having depression or other mental health conditions.  Beginning to drink at an early age.  Binge drinking often.  Experiencing trauma, stress, and an unstable home life during childhood.  Spending time with people who drink often. What actions can I take to prevent or manage alcohol abuse and dependence?  Do not drink alcohol if: ? Your health care provider tells you not to drink. ? You are pregnant, may be pregnant, or are planning to become pregnant.  If you drink alcohol: ? Limit how much you use to:  0-1 drink a day for women.  0-2 drinks a day for men. ? Be aware of how much alcohol is in your drink. In the [place] one drink equals one 12 oz bottle of beer (355 mL), one 5 oz glass of wine (148 mL), or one 1 oz glass of hard liquor (44 mL).  Stop drinking if you have been drinking too much. This can be very hard to do if you are used to abusing alcohol. If you begin to have withdrawal symptoms, talk with your health care provider or a person that you trust. These symptoms may include anxiety, shaky hands, headache, nausea, sweating, or not being able to sleep.  Choose to drink nonalcoholic beverages in social gatherings and places where there may be alcohol. Activity  Spend more time on activities that you enjoy that do   not involve alcohol, like hobbies or exercise.  Find healthy ways to cope with stress, such as exercise, meditation, or spending time with people you care about. General information  Talk to your family, coworkers, and friends about supporting you in your efforts to stop drinking. If they drink, ask them not to drink around you. Spend more time with people who do not drink alcohol.  If you think that you have an alcohol dependency problem: ? Tell friends or family about your concerns. ? Talk with your  health care provider or another health professional about where to get help. ? Work with a Transport planner and a Regulatory affairs officer. ? Consider joining a support group for people who struggle with alcohol abuse and dependence. Where to find support  Your health care provider.  SMART Recovery: www.smartrecovery.org Therapy and support groups  Local treatment centers or chemical dependency counselors.  Local AA groups in your community: [employer].com.pt   Where to find more information  Centers for Disease Control and Prevention: http://www.wolf.info/  National Institute on Alcohol Abuse and Alcoholism: http://www.bradshaw.com/  Alcoholics Anonymous (AA): [employer]Tax.com.pt Contact a health care provider if:  You drank more or for longer than you intended on more than one occasion.  You tried to stop drinking or to cut back on how much you drink, but you were not able to.  You often drink to the point of vomiting or passing out.  You want to drink so badly that you cannot think about anything else.  You have problems in your life due to drinking, but you continue to drink.  You keep drinking even though you feel anxious, depressed, or have experienced memory loss.  You have stopped doing the things you used to enjoy in order to drink.  You have to drink more than you used to in order to get the effect you want.  You experience anxiety, sweating, nausea, shakiness, and trouble sleeping when you try to stop drinking. Get help right away if:  You have thoughts about hurting yourself or others.  You have serious withdrawal symptoms, including: ? Confusion. ? Racing heart. ? High blood pressure. ? Fever. If you ever feel like you may hurt yourself or others, or have thoughts about taking your own life, get help right away. You can go to your nearest emergency department or call:  Your local emergency services (911 in the [place]  A suicide crisis helpline, such as the [place] at (614) 505-2892. This is open 24 hours a day. Summary  Alcohol abuse and dependence can have a negative effect on your life. Drinking too much or too often can lead to addiction.  If you drink alcohol, limit how much you use.  If you are having trouble keeping your drinking under control, find ways to change your behavior. Hobbies, calming activities, exercise, or support groups can help.  If you feel you need help with changing your drinking habits, talk with your health care provider, a good friend, or a therapist, or go to an [place] group. This information is not intended to replace advice given to you by your health care provider. Make sure you discuss any questions you have with your health care provider. Document Revised: 10/13/2018 Document Reviewed: 09/01/2018 Elsevier Patient Education  [place].

## 2020-10-03 NOTE — Progress Notes (Signed)
Subjective:  Patient ID: Gary Mitchell, male    DOB: 1970/11/22  Age: 50 y.o. MRN: [id number]  CC: Hospitalization Follow-up   HPI Gary Mitchell is a 50 year old male with a history of alcoholic liver disease, pancreatitis, history of alcoholic hepatitis in 03/4075, alcohol-related seizures who presents today for follow-up from hospitalization from 09/17/2020 through 09/21/2020.   He had presented with hepatic encephalopathy and was nonverbal, ammonia level was 400 and urine drug screen positive for benzodiazepines, drug levels for seizure was negative.  He underwent an EGD which revealed grade 1 esophageal varices, no bleeding, portal hypertensive gastropathy.  His antiseizure medications were restarted, received 1 unit PRBC for anemia.  Electrolytes were also corrected.   He was discharged on Keppra for seizures as well as thiamine, folic acid and his PPI.    Still drinks alcohol and has cut back he states to about 8 oz every other day. Gets dyspneic on mild exertion, he has no dyspnea, no pedal edema. He has no hematochezia or hematemesis.  He has not had any recent seizures. GI appointment comes up in 11/2020  Past Medical History:  Diagnosis Date  . Alcoholic hepatitis 80/8811  . 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 Coagulopathy ([place])    Attributed to liver disease/cirrhosis  . Pancreatic lesion 11/2019   Initially concerning for neoplasm but improved appearance on MRI 12/2019 at which time pseudocyst was most likely diagnosis.  . Pancreatitis 11/2019   Attributed to alcohol abuse  . 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 Seizures ([place])   . Thrombocytopenia ([place])     Past Surgical History:  Procedure Laterality Date  . ENTEROSCOPY N/A 09/20/2020   Procedure: ENTEROSCOPY;  Surgeon: Thornton Park, MD;  Location: [facility];  Service: Gastroenterology;  Laterality: N/A;  . left kidney removed      Family History  Problem Relation Age of  Onset  . Diabetes Mellitus II Mother   . Colon cancer Neg Hx   . Esophageal cancer Neg Hx     Allergies  Allergen Reactions  . Iohexol Other (See Comments)    Unknown reaction at 33 days old    Outpatient Medications Prior to Visit  Medication Sig Dispense Refill  . ferrous sulfate 325 (65 FE) MG tablet Take 1 tablet (325 mg total) by mouth daily with breakfast. 30 tablet 0  . folic acid (FOLVITE) 1 MG tablet Take 1 tablet (1 mg total) by mouth daily. 30 tablet 0  . lactulose (CHRONULAC) 10 GM/15ML solution Take 45 mLs (30 g total) by mouth 3 (three) times daily. 236 mL 0  . pantoprazole (PROTONIX) 40 MG tablet Take 1 tablet (40 mg total) by mouth daily. 30 tablet 0  . thiamine 100 MG tablet Take 1 tablet (100 mg total) by mouth daily. 30 tablet 0  . vitamin k 100 MCG tablet Take 1 tablet (100 mcg total) by mouth daily. For 60 days per patient 30 tablet 2  . levETIRAcetam (KEPPRA) 500 MG tablet Take 1 tablet (500 mg total) by mouth 2 (two) times daily. (Patient not taking: Reported on 10/03/2020) 60 tablet 0   No facility-administered medications prior to visit.     ROS Review of Systems  Constitutional: Negative for activity change and appetite change.  HENT: Negative for sinus pressure and sore throat.   Eyes: Negative for visual disturbance.  Respiratory: Negative for cough, chest tightness and shortness of breath.   Cardiovascular: Negative  for chest pain and leg swelling.  Gastrointestinal: Negative for abdominal distention, abdominal pain, constipation and diarrhea.  Endocrine: Negative.   Genitourinary: Negative for dysuria.  Musculoskeletal: Negative for joint swelling and myalgias.  Skin: Negative for rash.  Allergic/Immunologic: Negative.   Neurological: Negative for weakness, light-headedness and numbness.  Psychiatric/Behavioral: Negative for dysphoric mood and suicidal ideas.    Objective:  BP 114/78 (BP Location: Right Arm, Patient Position: Sitting)   Pulse  96   Ht _0  (1.676 m)   Wt 142 lb 3.2 oz (64.5 kg)   SpO2 100%   BMI 22.95 kg/m   BP/Weight 10/03/2020 09/21/2020 8/50/2774  Systolic BP 128 786 -  Diastolic BP 78 91 -  Wt. (Lbs) 142.2 - 156  BMI 22.95 - 25.18      Physical Exam Constitutional:      Appearance: He is well-developed.  Eyes:     Comments: Icteric sclera  Neck:     Vascular: No JVD.  Cardiovascular:     Rate and Rhythm: Normal rate.     Heart sounds: Normal heart sounds. No murmur heard.   Pulmonary:     Effort: Pulmonary effort is normal.     Breath sounds: Normal breath sounds. No wheezing or rales.  Chest:     Chest wall: No tenderness.  Abdominal:     General: Bowel sounds are normal. There is no distension.     Palpations: Abdomen is soft. There is no mass.     Tenderness: There is no abdominal tenderness.  Musculoskeletal:        General: Normal range of motion.     Right lower leg: No edema.     Left lower leg: No edema.  Neurological:     Mental Status: He is alert and oriented to person, place, and time.  Psychiatric:        Mood and Affect: Mood normal.     CMP Latest Ref Rng & Units 09/21/2020 09/20/2020 09/19/2020  Glucose 70 - 99 mg/dL 186(H) 111(H) 101(H)  BUN 6 - 20 mg/dL <5(L) <5(L) <5(L)  Creatinine 0.61 - 1.24 mg/dL 0.58(L) 0.56(L) 0.62  Sodium 135 - 145 mmol/L 130(L) 134(L) 131(L)  Potassium 3.5 - 5.1 mmol/L 3.3(L) 3.1(L) 3.0(L)  Chloride 98 - 111 mmol/L 100 104 98  CO2 22 - 32 mmol/L 20(L) 21(L) 21(L)  Calcium 8.9 - 10.3 mg/dL 8.4(L) 8.0(L) 7.5(L)  Total Protein 6.5 - 8.1 g/dL 7.2 6.7 6.9  Total Bilirubin 0.3 - 1.2 mg/dL 5.0(H) 4.8(H) 5.9(H)  Alkaline Phos 38 - 126 U/L 157(H) 136(H) 151(H)  AST 15 - 41 U/L 43(H) 46(H) 62(H)  ALT 0 - 44 U/L _1 Lipid Panel     Component Value Date/Time   TRIG 120 07/09/2020 1240    CBC    Component Value Date/Time   WBC 6.8 09/21/2020 0435   RBC 3.16 (L) 09/21/2020 0435   HGB 8.7 (L) 09/21/2020 0435   HGB 11.1 (L)  01/12/2020 1043   HCT 26.1 (L) 09/21/2020 0435   HCT 31.5 (L) 01/12/2020 1043   PLT 76 (L) 09/21/2020 0435   PLT 159 01/12/2020 1043   MCV 82.6 09/21/2020 0435   MCV 86 01/12/2020 1043   MCH 27.5 09/21/2020 0435   MCHC 33.3 09/21/2020 0435   RDW 22.5 (H) 09/21/2020 0435   RDW 13.4 01/12/2020 1043   LYMPHSABS 2.0 09/20/2020 0414   LYMPHSABS 2.5 01/12/2020 1043   MONOABS 1.3 (H) 09/20/2020 7672  EOSABS 0.1 09/20/2020 0414   EOSABS 0.1 01/12/2020 1043   BASOSABS 0.1 09/20/2020 0414   BASOSABS 0.1 01/12/2020 1043    Lab Results  Component Value Date   HGBA1C 10.2 (H) 09/18/2020    Assessment & Plan:  1. Alcoholic liver disease ([place]) Counseled on quitting alcohol He does appear severely icteric and I will order labs to check his bilirubin - thiamine 100 MG tablet; Take 1 tablet (100 mg total) by mouth daily.  Dispense: 30 tablet; Refill: 3 - pantoprazole (PROTONIX) 40 MG tablet; Take 1 tablet (40 mg total) by mouth daily.  Dispense: 30 tablet; Refill: 3 - lactulose (CHRONULAC) 10 GM/15ML solution; Take 45 mLs (30 g total) by mouth 3 (three) times daily.  Dispense: 236 mL; Refill: 0 - folic acid (FOLVITE) 1 MG tablet; Take 1 tablet (1 mg total) by mouth daily.  Dispense: 30 tablet; Refill: 3 - CMP14+EGFR - CBC with Differential/Platelet - Protime-INR  2. Alcohol related seizure ([place]) No recent seizures - levETIRAcetam (KEPPRA) 500 MG tablet; Take 1 tablet (500 mg total) by mouth 2 (two) times daily.  Dispense: 60 tablet; Refill: 3  3. Pancreatic mass Previous history of pancreatitis and pancreatic pseudocyst Has an upcoming appointment with GI  4. Anemia, unspecified type We will check CBC again given recent anemia  5. Alcohol abuse I have restarted naltrexone to assist with cessation - naltrexone (DEPADE) 50 MG tablet; Take 1 tablet (50 mg total) by mouth daily.  Dispense: 30 tablet; Refill: 3    No orders of the defined types were placed in this  encounter.   Return in about 3 months (around 01/03/2021) for Chronic medical conditions.       Charlott Rakes, MD, FAAFP. [facility], [place]   10/03/2020, 11:02 AM

## 2020-10-04 ENCOUNTER — Telehealth: Payer: Self-pay | Admitting: Family Medicine

## 2020-10-04 LAB — CMP14+EGFR
ALT: 11 IU/L (ref 0–44)
AST: 39 IU/L (ref 0–40)
Albumin/Globulin Ratio: 0.7 — ABNORMAL LOW (ref 1.2–2.2)
Albumin: 3.2 g/dL — ABNORMAL LOW (ref 4.0–5.0)
Alkaline Phosphatase: 251 IU/L — ABNORMAL HIGH (ref 44–121)
BUN/Creatinine Ratio: 8 — ABNORMAL LOW (ref 9–20)
BUN: 5 mg/dL — ABNORMAL LOW (ref 6–24)
Bilirubin Total: 11.4 mg/dL — ABNORMAL HIGH (ref 0.0–1.2)
CO2: 19 mmol/L — ABNORMAL LOW (ref 20–29)
Calcium: 9 mg/dL (ref 8.7–10.2)
Chloride: 98 mmol/L (ref 96–106)
Creatinine, Ser: 0.62 mg/dL — ABNORMAL LOW (ref 0.76–1.27)
Globulin, Total: 4.4 g/dL (ref 1.5–4.5)
Glucose: 157 mg/dL — ABNORMAL HIGH (ref 65–99)
Potassium: 3.8 mmol/L (ref 3.5–5.2)
Sodium: 135 mmol/L (ref 134–144)
Total Protein: 7.6 g/dL (ref 6.0–8.5)
eGFR: 119 mL/min/{1.73_m2} (ref >59)

## 2020-10-04 LAB — CBC WITH DIFFERENTIAL/PLATELET
Basophils Absolute: 0.2 10*3/uL (ref 0.0–0.2)
Basos: 2 %
EOS (ABSOLUTE): 0.1 10*3/uL (ref 0.0–0.4)
Eos: 1 %
Hematocrit: 30 % — ABNORMAL LOW (ref 37.5–51.0)
Hemoglobin: 10.6 g/dL — ABNORMAL LOW (ref 13.0–17.7)
Immature Grans (Abs): 0.1 10*3/uL (ref 0.0–0.1)
Immature Granulocytes: 1 %
Lymphocytes Absolute: 2.2 10*3/uL (ref 0.7–3.1)
Lymphs: 17 %
MCH: 26 pg — ABNORMAL LOW (ref 26.6–33.0)
MCHC: 35.3 g/dL (ref 31.5–35.7)
MCV: 74 fL — ABNORMAL LOW (ref 79–97)
Monocytes Absolute: 1.4 10*3/uL — ABNORMAL HIGH (ref 0.1–0.9)
Monocytes: 11 %
Neutrophils Absolute: 9.3 10*3/uL — ABNORMAL HIGH (ref 1.4–7.0)
Neutrophils: 68 %
Platelets: 301 10*3/uL (ref 150–450)
RBC: 4.07 x10E6/uL — ABNORMAL LOW (ref 4.14–5.80)
RDW: 21.1 % — ABNORMAL HIGH (ref 11.6–15.4)
WBC: 13.4 10*3/uL — ABNORMAL HIGH (ref 3.4–10.8)

## 2020-10-04 LAB — PROTIME-INR
INR: 1.6 — ABNORMAL HIGH (ref 0.9–1.2)
Prothrombin Time: 16.7 s — ABNORMAL HIGH (ref 9.1–12.0)

## 2020-10-04 NOTE — Telephone Encounter (Signed)
Can you reach out to this patient and place him on my schedule for diabetes management?

## 2020-10-04 NOTE — Telephone Encounter (Signed)
She has an A1c of 10.3 from hospitalization which was previously 4.3 in the middle of last year.  He was also treated with steroids early in the year when he had COVID.  Can you please bring him in for an office visit to see you to repeat his A1c, check his CBG and commence on diabetic medications if indicated?  He does have alcoholic liver disease which could also be influencing his values. Thank you

## 2020-10-06 ENCOUNTER — Telehealth: Payer: Self-pay

## 2020-10-06 NOTE — Telephone Encounter (Signed)
-----   Message from Charlott Rakes, MD sent at 10/05/2020  8:54 AM EDT ----- Please inform him that his liver enzymes are severely elevated due to his cirrhosis.  I would recommend he quit alcohol because his condition puts him at risk of becoming unconscious again and being hospitalized.  Please advise to follow-up with his GI.

## 2020-10-06 NOTE — Telephone Encounter (Signed)
Patient name and DOB has been verified Patient was informed of lab results. Patient had no questions.  

## 2020-10-17 NOTE — Telephone Encounter (Signed)
Called patient and LVM advising him to call [phone] to schedule an appointment with clinical pharmacist for diabetes management. If patient returns call please schedule with clinical pharmacist or have patient transferred to office to schedule.

## 2020-11-15 ENCOUNTER — Encounter: Payer: Self-pay | Admitting: Gastroenterology

## 2020-11-15 ENCOUNTER — Other Ambulatory Visit: Payer: Self-pay

## 2020-11-15 ENCOUNTER — Ambulatory Visit (INDEPENDENT_AMBULATORY_CARE_PROVIDER_SITE_OTHER): Payer: Self-pay | Admitting: Gastroenterology

## 2020-11-15 ENCOUNTER — Other Ambulatory Visit (INDEPENDENT_AMBULATORY_CARE_PROVIDER_SITE_OTHER): Payer: Self-pay

## 2020-11-15 DIAGNOSIS — K7030 Alcoholic cirrhosis of liver without ascites: Secondary | ICD-10-CM

## 2020-11-15 DIAGNOSIS — K709 Alcoholic liver disease, unspecified: Secondary | ICD-10-CM

## 2020-11-15 LAB — COMPREHENSIVE METABOLIC PANEL
ALT: 13 U/L (ref 0–53)
AST: 26 U/L (ref 0–37)
Albumin: 4 g/dL (ref 3.5–5.2)
Alkaline Phosphatase: 160 U/L — ABNORMAL HIGH (ref 39–117)
BUN: 7 mg/dL (ref 6–23)
CO2: 22 mEq/L (ref 19–32)
Calcium: 10 mg/dL (ref 8.4–10.5)
Chloride: 94 mEq/L — ABNORMAL LOW (ref 96–112)
Creatinine, Ser: 0.62 mg/dL (ref 0.40–1.50)
GFR: 113.43 mL/min (ref >60.00)
Glucose, Bld: 475 mg/dL — ABNORMAL HIGH (ref 70–99)
Potassium: 4.2 mEq/L (ref 3.5–5.1)
Sodium: 128 mEq/L — ABNORMAL LOW (ref 135–145)
Total Bilirubin: 3.7 mg/dL — ABNORMAL HIGH (ref 0.2–1.2)
Total Protein: 9 g/dL — ABNORMAL HIGH (ref 6.0–8.3)

## 2020-11-15 LAB — CBC
HCT: 37.2 % — ABNORMAL LOW (ref 39.0–52.0)
Hemoglobin: 12.3 g/dL — ABNORMAL LOW (ref 13.0–17.0)
MCHC: 33.2 g/dL (ref 30.0–36.0)
MCV: 85.9 fl (ref 78.0–100.0)
Platelets: 153 10*3/uL (ref 150.0–400.0)
RBC: 4.33 Mil/uL (ref 4.22–5.81)
RDW: 18.7 % — ABNORMAL HIGH (ref 11.5–15.5)
WBC: 7.7 10*3/uL (ref 4.0–10.5)

## 2020-11-15 LAB — PROTIME-INR
INR: 1.5 ratio — ABNORMAL HIGH (ref 0.8–1.0)
Prothrombin Time: 16.8 s — ABNORMAL HIGH (ref 9.6–13.1)

## 2020-11-15 MED ORDER — PREDNISONE 10 MG PO TABS
ORAL_TABLET | ORAL | 0 refills | Status: DC
  Filled 2020-11-15: qty 15, 2d supply, fill #0

## 2020-11-15 MED ORDER — LACTULOSE ENCEPHALOPATHY 10 GM/15ML PO SOLN
20.0000 g | Freq: Every day | ORAL | 0 refills | Status: DC
  Filled 2020-11-15 (×3): qty 900, 30d supply, fill #0

## 2020-11-15 NOTE — Patient Instructions (Addendum)
If you are age 48 or younger, your body mass index should be between 19-25. Your Body mass index is 21.31 kg/m. If this is out of the aformentioned range listed, please consider follow up with your Primary Care Provider.   Your provider has requested that you go to the basement level for lab work before leaving today. Press "B" on the elevator. The lab is located at the first door on the left as you exit the elevator.  STOP: Vitamin K  Please purchase the following medications over the counter and take as directed:  START: Multivitamin with Iron take once daily.  We have sent the following medications to your pharmacy for you to pick up at your convenience:  RESTART: Lactulose 30cc once daily.  You have been scheduled for a CT scan of the abdomen and pelvis at [facility] ([address]---this is in the same building as [employer]).   You are scheduled on 11-29-2020 at 10:00am. You should arrive at 9:15am. Please follow the written instructions below on the day of your exam:  Since you are allergic to one or more components in IV contrast, we have sent a prescription for (3) 50 mg tablets of Prednisone to your pharmacy as a Pre-Medication Prep for your upcoming procedure requiring contrast.    Take (1) 50 mg tablet of prednisone 13 hours prior to your procedure at 9:00pm.   Take (1) 50 mg tablet 7 hours prior to your procedure at 3:00am.    Take (1) 50 mg tablet 1 hour prior to your procedure at 9:00am.    You also need to take 50 mg of Benadryl 1 hour prior to your procedure at 9:00am.  If you have 25 mg tablets of Benadryl, which can be purchased over the counter, you may take 2 tablets.  Otherwise we can send a prescription for (1) 50 mg tablet of Benadryl to your pharmacy.    Please do not eat anything after midnight the night prior to your procedure.  Once you arrive to the exam you will drink 3 bottles of water on site.  If you have any questions regarding  your exam or if you need to reschedule, you may call the CT department at [phone] between the hours of 8:00 am and 5:00 pm, Monday-Friday.   Due to recent changes in healthcare laws, you may see the results of your imaging and laboratory studies on MyChart before your provider has had a chance to review them.  We understand that in some cases there may be results that are confusing or concerning to you. Not all laboratory results come back in the same time frame and the provider may be waiting for multiple results in order to interpret others.  Please give [place] 48 hours in order for your provider to thoroughly review all the results before contacting the office for clarification of your results.   You are scheduled for a follow up appointment on 01-01-2021 at 10:30am  Thank you for entrusting me with your care and choosing [facility].  Dr Ardis Hughs

## 2020-11-15 NOTE — Progress Notes (Signed)
Review of pertinent gastrointestinal problems: 1.  Alcoholic liver disease; presented with severe alcoholic hepatitis and also alcoholic pancreatitis in the setting of underlying cirrhosis May 202.   MRI May 2021 showed abnormal liver contour, cirrhotic changes of the liver, innumerable small regenerating nodules throughout the liver, evidence of portal hypertension as well with portal venous collaterals.  Ultrasound January 2022 cirrhosis no discrete liver lesions.  Ultrasound March 2022 showed no ascites.  EGD March 2022 while hospitalized showed small esophageal varices, mild portal gastropathy.  No gastric varices.  Presenting total bilirubin 35, AST/ALT ratio greater than 2 1, INR 1.5; discriminant function was high enough that he qualified for oral prednisolone 4-week course  Continuing to drink heavily as of March 2022.  2.  Abnormal pancreas.  Presented with acute pancreatitis at the same time as his severe alcoholic hepatitis in May 2021.  Imaging while he was in the hospital also suggested "4 cm ill-defined infiltrating pancreatic body lesion worrisome for infiltrating neoplasm/adenocarcinoma.  This is associated with marked atrophy of the pancreatic tail and dilation of the main pancreatic duct".  Repeat MRI June 2021 was overall much improved.  The "masslike lesion" in the pancreatic body was much less appreciated.  There was a new fluid collection anterior to the pancreatic body look like a pseudocyst.  I recommended follow-up in the office 2 months later, he never scheduled or showed for that appointment.   HPI: This is a pleasant 50 year old man  I last saw him in the office about 1 year ago.  He failed to follow-up after that.  He was admitted to the hospital for what looks like a 3 or 4-day admission in March 2022 with melena, acute on chronic anemia, seizures, he underwent EGD during admission.  See those results summarized above.  During that hospitalization his meld score was  calculated at 18, his meld sodium score was 27.  His discriminant function was 28 for alcoholic hepatitis.  His weight is down 21 pounds since his last office visit here about a year ago.  Today he tells me that his last drink of vodka was about 2 weeks ago.  Since leaving the hospital he has had no bleeding, his urine is back to normal color.  He has indeed been confused a few times.  He moves his bowels 3 times daily, they are generally soft.  He eats well but has still noticed that his weight is down compared to 1 year ago by about 20 pounds.  He really has no abdominal pains.  He has not been taking most of the medicines that he was discharged with, specifically he has not been taking lactulose.  He tells me he is out of proton pump inhibitor Protonix however he has no GERD symptoms.  ROS: complete GI ROS as described in HPI, all other review negative.  Constitutional:  No unintentional weight loss   Past Medical History:  Diagnosis Date  . Alcoholic hepatitis 14/4818  . 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 Coagulopathy ([place])    Attributed to liver disease/cirrhosis  . Pancreatic lesion 11/2019   Initially concerning for neoplasm but improved appearance on MRI 12/2019 at which time pseudocyst was most likely diagnosis.  . Pancreatitis 11/2019   Attributed to alcohol abuse  . 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 Seizures ([place])   . Thrombocytopenia ([place])     Past Surgical History:  Procedure Laterality Date  . ENTEROSCOPY N/A 09/20/2020  Procedure: ENTEROSCOPY;  Surgeon: Thornton Park, MD;  Location: [facility];  Service: Gastroenterology;  Laterality: N/A;  . left kidney removed      Current Outpatient Medications  Medication Sig Dispense Refill  . levETIRAcetam (KEPPRA) 500 MG tablet Take 1 tablet (500 mg total) by mouth 2 (two) times daily. 60 tablet 3  . Menatetrenone (VITAMIN K2) 100 MCG TABS TAKE 1 TABLET (100 MCG TOTAL) BY MOUTH  DAILY. FOR 60 DAYS PER PATIENT 30 tablet 2  . naltrexone (DEPADE) 50 MG tablet Take 1 tablet (50 mg total) by mouth daily. 30 tablet 3   No current facility-administered medications for this visit.    Allergies as of 11/15/2020 - Review Complete 11/15/2020  Allergen Reaction Noted  . Iohexol Other (See Comments) 05/21/2016    Family History  Problem Relation Age of Onset  . Diabetes Mellitus II Mother   . Colon cancer Neg Hx   . Esophageal cancer Neg Hx     Social History   Socioeconomic History  . Marital status: Single    Spouse name: Not on file  . Number of children: Not on file  . Years of education: Not on file  . Highest education level: Not on file  Occupational History  . Not on file  Tobacco Use  . Smoking status: Current Every Day Smoker    Packs/day: 2.00    Years: 25.00    Pack years: 50.00  . Smokeless tobacco: Never Used  Vaping Use  . Vaping Use: Never used  Substance and Sexual Activity  . Alcohol use: Not Currently    Alcohol/week: 3.0 standard drinks    Types: 3 Cans of beer per week  . Drug use: Not Currently    Types: Marijuana  . Sexual activity: Not on file  Other Topics Concern  . Not on file  Social History Narrative  . Not on file   Social Determinants of Health   Financial Resource Strain: Not on file  Food Insecurity: Not on file  Transportation Needs: Not on file  Physical Activity: Not on file  Stress: Not on file  Social Connections: Not on file  Intimate Partner Violence: Not on file     Physical Exam: BP 116/74   Pulse 66   Ht 5\' 6"  (1.676 m)   Wt 132 lb (59.9 kg)   BMI 21.31 kg/m  Constitutional: generally well-appearing Psychiatric: alert and oriented x3 Abdomen: soft, nontender, nondistended, no obvious ascites, no peritoneal signs, normal bowel sounds No peripheral edema noted in lower extremities  Assessment and plan: 50 y.o. male with alcoholic liver disease, alcoholic related pancreatitis, abnormal  pancreas on previous imaging  First he understands that absolute most important is that he never resumed drinking.  Today he tells me that his last alcohol beverage was vodka, 2 weeks ago.  Hopefully he will be able to continue abstinence indefinitely but I think the chances of that are small.  Second we need to restage his liver disease with CBC, complete metabolic profile, coags, alpha-fetoprotein.  He has no lower extremity edema or obvious ascites on exam.  He does not have asterixis but given his mild encephalopathy like confusion at times I am starting him on relatively low-dose lactulose 30 cc once daily.  I am not sure why he was told to take vitamin K on an every day basis but I am stopping that today.  I do however want him on a multivitamin with iron every single day.  Lastly he has  had abnormal pancreas in the past, repeat imaging 2021 suggested no serious changes such as obvious tumor.  He has lost 20 pounds since then and I do have some concern about his pancreas and I am ordering pancreatic imaging with CT scan abdomen pelvis with IV and oral contrast.  He will return to see me in 6 to 8 weeks and sooner if any problems.  Please see the "Patient Instructions" section for addition details about the plan.  Owens Loffler, MD [facility] 11/15/2020, 9:14 AM   Total time on date of encounter was 45 minutes (this included time spent preparing to see the patient reviewing records; obtaining and/or reviewing separately obtained history; performing a medically appropriate exam and/or evaluation; counseling and educating the patient and family if present; ordering medications, tests or procedures if applicable; and documenting clinical information in the health record).

## 2020-11-16 LAB — AFP TUMOR MARKER: AFP-Tumor Marker: 5.1 ng/mL (ref <6.1)

## 2020-11-29 ENCOUNTER — Other Ambulatory Visit: Payer: Self-pay

## 2020-11-29 ENCOUNTER — Ambulatory Visit (INDEPENDENT_AMBULATORY_CARE_PROVIDER_SITE_OTHER)
Admission: RE | Admit: 2020-11-29 | Discharge: 2020-11-29 | Disposition: A | Discharge status: Home / Self Care | Payer: Self-pay | Source: Ambulatory Visit | Attending: Gastroenterology | Admitting: Gastroenterology

## 2020-11-29 DIAGNOSIS — K709 Alcoholic liver disease, unspecified: Secondary | ICD-10-CM

## 2020-11-29 DIAGNOSIS — K7030 Alcoholic cirrhosis of liver without ascites: Secondary | ICD-10-CM

## 2020-11-29 MED ORDER — DIPHENHYDRAMINE HCL 50 MG/ML IJ SOLN: 50.0000 mg | Freq: Once | INTRAMUSCULAR | Status: DC

## 2020-11-29 MED ORDER — PREDNISONE 1 MG PO TABS
50.0000 mg | ORAL_TABLET | Freq: Four times a day (QID) | ORAL | Status: DC
Start: 2020-11-29 — End: 2020-11-30

## 2020-11-29 MED ORDER — DIPHENHYDRAMINE HCL 25 MG PO CAPS: 50.0000 mg | ORAL_CAPSULE | Freq: Once | ORAL | Status: DC

## 2020-11-29 MED ORDER — IOHEXOL 300 MG/ML  SOLN
100.0000 mL | Freq: Once | INTRAMUSCULAR | Status: AC | PRN
  Administered 2020-11-29: 100 mL via INTRAVENOUS

## 2020-12-07 ENCOUNTER — Other Ambulatory Visit: Payer: Self-pay

## 2020-12-07 DIAGNOSIS — Q453 Other congenital malformations of pancreas and pancreatic duct: Secondary | ICD-10-CM

## 2021-01-01 ENCOUNTER — Ambulatory Visit: Payer: Self-pay | Admitting: Gastroenterology

## 2021-01-11 ENCOUNTER — Encounter (HOSPITAL_COMMUNITY): Admission: RE | Payer: Self-pay | Source: Home / Self Care

## 2021-01-11 ENCOUNTER — Ambulatory Visit (HOSPITAL_COMMUNITY): Admission: RE | Admit: 2021-01-11 | Payer: Self-pay | Source: Home / Self Care | Admitting: Gastroenterology

## 2021-01-11 SURGERY — UPPER ENDOSCOPIC ULTRASOUND (EUS) RADIAL
Anesthesia: Monitor Anesthesia Care

## 2021-04-06 ENCOUNTER — Other Ambulatory Visit: Payer: Self-pay | Admitting: Gastroenterology

## 2021-04-06 ENCOUNTER — Other Ambulatory Visit: Payer: Self-pay

## 2021-04-06 MED ORDER — LACTULOSE ENCEPHALOPATHY 10 GM/15ML PO SOLN
20.0000 g | Freq: Every day | ORAL | 0 refills | Status: DC
  Filled 2021-04-06 – 2021-05-07 (×2): qty 900, 30d supply, fill #0

## 2021-04-13 ENCOUNTER — Other Ambulatory Visit: Payer: Self-pay

## 2021-05-06 ENCOUNTER — Emergency Department (HOSPITAL_COMMUNITY): Payer: Self-pay

## 2021-05-06 ENCOUNTER — Emergency Department (HOSPITAL_COMMUNITY)
Admission: EM | Admit: 2021-05-06 | Discharge: 2021-05-07 | Disposition: A | Discharge status: Home / Self Care | Payer: Self-pay | Attending: Emergency Medicine | Admitting: Emergency Medicine

## 2021-05-06 ENCOUNTER — Other Ambulatory Visit: Payer: Self-pay

## 2021-05-06 DIAGNOSIS — R531 Weakness: Secondary | ICD-10-CM | POA: Insufficient documentation

## 2021-05-06 DIAGNOSIS — Z8616 Personal history of COVID-19: Secondary | ICD-10-CM | POA: Insufficient documentation

## 2021-05-06 DIAGNOSIS — R5383 Other fatigue: Secondary | ICD-10-CM | POA: Insufficient documentation

## 2021-05-06 DIAGNOSIS — F17210 Nicotine dependence, cigarettes, uncomplicated: Secondary | ICD-10-CM | POA: Insufficient documentation

## 2021-05-06 DIAGNOSIS — R079 Chest pain, unspecified: Secondary | ICD-10-CM | POA: Insufficient documentation

## 2021-05-06 DIAGNOSIS — R3589 Other polyuria: Secondary | ICD-10-CM | POA: Insufficient documentation

## 2021-05-06 DIAGNOSIS — R631 Polydipsia: Secondary | ICD-10-CM | POA: Insufficient documentation

## 2021-05-06 DIAGNOSIS — I10 Essential (primary) hypertension: Secondary | ICD-10-CM | POA: Insufficient documentation

## 2021-05-06 DIAGNOSIS — R739 Hyperglycemia, unspecified: Secondary | ICD-10-CM | POA: Insufficient documentation

## 2021-05-06 LAB — URINALYSIS, ROUTINE W REFLEX MICROSCOPIC
Bacteria, UA: NONE SEEN
Bilirubin Urine: NEGATIVE
Glucose, UA: >=500 mg/dL — AB
Hgb urine dipstick: NEGATIVE
Ketones, ur: NEGATIVE mg/dL
Leukocytes,Ua: NEGATIVE
Nitrite: NEGATIVE
Protein, ur: NEGATIVE mg/dL
Specific Gravity, Urine: 1.027 (ref 1.005–1.030)
pH: 6 (ref 5.0–8.0)

## 2021-05-06 LAB — CBC WITH DIFFERENTIAL/PLATELET
Abs Immature Granulocytes: 0.01 10*3/uL (ref 0.00–0.07)
Basophils Absolute: 0.1 10*3/uL (ref 0.0–0.1)
Basophils Relative: 2 %
Eosinophils Absolute: 0.1 10*3/uL (ref 0.0–0.5)
Eosinophils Relative: 2 %
HCT: 26.9 % — ABNORMAL LOW (ref 39.0–52.0)
Hemoglobin: 8.7 g/dL — ABNORMAL LOW (ref 13.0–17.0)
Immature Granulocytes: 0 %
Lymphocytes Relative: 36 %
Lymphs Abs: 2.1 10*3/uL (ref 0.7–4.0)
MCH: 26 pg (ref 26.0–34.0)
MCHC: 32.3 g/dL (ref 30.0–36.0)
MCV: 80.5 fL (ref 80.0–100.0)
Monocytes Absolute: 0.5 10*3/uL (ref 0.1–1.0)
Monocytes Relative: 9 %
Neutro Abs: 2.9 10*3/uL (ref 1.7–7.7)
Neutrophils Relative %: 51 %
Platelets: 122 10*3/uL — ABNORMAL LOW (ref 150–400)
RBC: 3.34 MIL/uL — ABNORMAL LOW (ref 4.22–5.81)
RDW: 21.8 % — ABNORMAL HIGH (ref 11.5–15.5)
WBC: 5.7 10*3/uL (ref 4.0–10.5)
nRBC: 0 % (ref 0.0–0.2)

## 2021-05-06 LAB — CBG MONITORING, ED
Glucose-Capillary: 465 mg/dL — ABNORMAL HIGH (ref 70–99)
Glucose-Capillary: 285 mg/dL — ABNORMAL HIGH (ref 70–99)

## 2021-05-06 LAB — COMPREHENSIVE METABOLIC PANEL
ALT: 15 U/L (ref 0–44)
AST: 39 U/L (ref 15–41)
Albumin: 3.4 g/dL — ABNORMAL LOW (ref 3.5–5.0)
Alkaline Phosphatase: 174 U/L — ABNORMAL HIGH (ref 38–126)
Anion gap: 16 — ABNORMAL HIGH (ref 5–15)
BUN: 6 mg/dL (ref 6–20)
CO2: 23 mmol/L (ref 22–32)
Calcium: 8.6 mg/dL — ABNORMAL LOW (ref 8.9–10.3)
Chloride: 95 mmol/L — ABNORMAL LOW (ref 98–111)
Creatinine, Ser: 0.43 mg/dL — ABNORMAL LOW (ref 0.61–1.24)
GFR, Estimated: >60 mL/min (ref >60)
Glucose, Bld: 380 mg/dL — ABNORMAL HIGH (ref 70–99)
Potassium: 3.1 mmol/L — ABNORMAL LOW (ref 3.5–5.1)
Sodium: 134 mmol/L — ABNORMAL LOW (ref 135–145)
Total Bilirubin: 3.7 mg/dL — ABNORMAL HIGH (ref 0.3–1.2)
Total Protein: 8.5 g/dL — ABNORMAL HIGH (ref 6.5–8.1)

## 2021-05-06 LAB — TROPONIN I (HIGH SENSITIVITY): Troponin I (High Sensitivity): 10 ng/L (ref <18)

## 2021-05-06 LAB — BLOOD GAS, VENOUS
Acid-base deficit: 1.9 mmol/L (ref 0.0–2.0)
Bicarbonate: 23.2 mmol/L (ref 20.0–28.0)
O2 Saturation: 66.6 %
Patient temperature: 37
pCO2, Ven: 44.4 mmHg (ref 44.0–60.0)
pH, Ven: 7.338 (ref 7.250–7.430)
pO2, Ven: 46.6 mmHg — ABNORMAL HIGH (ref 32.0–45.0)

## 2021-05-06 LAB — BETA-HYDROXYBUTYRIC ACID: Beta-Hydroxybutyric Acid: 0.82 mmol/L — ABNORMAL HIGH (ref 0.05–0.27)

## 2021-05-06 LAB — MAGNESIUM: Magnesium: 1.6 mg/dL — ABNORMAL LOW (ref 1.7–2.4)

## 2021-05-06 LAB — LIPASE, BLOOD: Lipase: 50 U/L (ref 11–51)

## 2021-05-06 MED ORDER — INSULIN ASPART 100 UNIT/ML IJ SOLN
4.0000 [IU] | Freq: Once | INTRAMUSCULAR | Status: AC
  Administered 2021-05-06: 4 [IU] via SUBCUTANEOUS
  Filled 2021-05-06: qty 0.04

## 2021-05-06 MED ORDER — MAGNESIUM SULFATE IN D5W 1-5 GM/100ML-% IV SOLN
1.0000 g | Freq: Once | INTRAVENOUS | Status: AC
  Administered 2021-05-07: 1 g via INTRAVENOUS
  Filled 2021-05-06: qty 100

## 2021-05-06 MED ORDER — POTASSIUM CHLORIDE 10 MEQ/100ML IV SOLN
10.0000 meq | INTRAVENOUS | Status: AC
  Administered 2021-05-06 (×2): 10 meq via INTRAVENOUS
  Filled 2021-05-06: qty 100

## 2021-05-06 MED ORDER — SODIUM CHLORIDE 0.9 % IV BOLUS
1000.0000 mL | Freq: Once | INTRAVENOUS | Status: AC
  Administered 2021-05-06: 1000 mL via INTRAVENOUS

## 2021-05-06 MED ORDER — METFORMIN HCL ER 500 MG PO TB24
500.0000 mg | ORAL_TABLET | Freq: Every day | ORAL | 0 refills | Status: DC
  Filled 2021-05-06 – 2021-05-07 (×2): qty 30, 30d supply, fill #0

## 2021-05-06 MED ORDER — INSULIN ASPART 100 UNIT/ML IJ SOLN
4.0000 [IU] | Freq: Once | INTRAMUSCULAR | Status: DC
  Filled 2021-05-06: qty 0.04

## 2021-05-06 MED ORDER — MAGNESIUM SULFATE 50 % IJ SOLN: 1.0000 g | Freq: Once | INTRAMUSCULAR | Status: DC

## 2021-05-06 MED ORDER — METFORMIN HCL 500 MG PO TABS
1000.0000 mg | ORAL_TABLET | Freq: Once | ORAL | Status: AC
  Administered 2021-05-06: 1000 mg via ORAL
  Filled 2021-05-06: qty 2

## 2021-05-06 MED ORDER — POTASSIUM CHLORIDE CRYS ER 20 MEQ PO TBCR
40.0000 meq | EXTENDED_RELEASE_TABLET | Freq: Once | ORAL | Status: AC
  Administered 2021-05-06: 40 meq via ORAL
  Filled 2021-05-06: qty 2

## 2021-05-06 NOTE — ED Provider Notes (Signed)
[facility] DEPT Provider Note   CSN: [id number] Arrival date & time: 05/06/21  1621     History Chief Complaint  Patient presents with   Hyperglycemia    Gary Mitchell is a 50 y.o. male.  HPI  50 year old male with history of alcoholic hepatitis, cirrhosis with alcoholism, coagulopathy, pancreatic lesion, pancreatitis, renal disorder, seizures, thrombocytopenia, who presents the emergency department today for evaluation of fatigue and hyperglycemia.  Patient notes that he has felt fatigued and generally weak for the last 4 days.  He further reports polyuria and polydipsia.  He with EMS his blood sugars were read as high.  He has no known history of diabetes.  He he reports some chest pain for the last 4 days but denies any shortness of breath, fevers, cough, other URI symptoms, vomiting, diarrhea or urinary symptoms.  Past Medical History:  Diagnosis Date   Alcoholic hepatitis 59/1638   Cirrhosis with alcoholism ([place]) 11/2019   Coagulopathy ([place])    Attributed to liver disease/cirrhosis   Pancreatic lesion 11/2019   Initially concerning for neoplasm but improved appearance on MRI 12/2019 at which time pseudocyst was most likely diagnosis.   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   Seizures ([place])    Thrombocytopenia ([place])     Patient Active Problem List   Diagnosis Date Noted   Portal hypertension ([place])    Esophageal varices without bleeding ([place])    Acute blood loss anemia    Seizure ([place]) 46/65/9935   Alcoholic cirrhosis of liver without ascites ([place]) 09/17/2020   Acute hepatic encephalopathy 09/17/2020   Hypomagnesemia 09/17/2020   Suspected CVA (cerebral vascular accident) ([place]) 09/17/2020   Hypokalemia 09/17/2020   Seizures ([place]) 07/09/2020   High bilirubin 07/09/2020   Anemia 07/09/2020   Thrombocytopenia ([place]) 07/09/2020   COVID-19 virus infection  07/09/2020   GERD (gastroesophageal reflux disease) 07/09/2020   Pancreatic mass 11/29/2019   Acute pancreatitis 70/17/7939   Alcoholic hepatitis 03/00/9233   Gallstones 11/23/2019   Alcohol abuse 11/23/2019    Past Surgical History:  Procedure Laterality Date   ENTEROSCOPY N/A 09/20/2020   Procedure: ENTEROSCOPY;  Surgeon: Thornton Park, MD;  Location: [facility];  Service: Gastroenterology;  Laterality: N/A;   left kidney removed         Family History  Problem Relation Age of Onset   Diabetes Mellitus II Mother    Colon cancer Neg Hx    Esophageal cancer Neg Hx     Social History   Tobacco Use   Smoking status: Every Day    Packs/day: 2.00    Years: 25.00    Pack years: 50.00    Types: Cigarettes   Smokeless tobacco: Never  Vaping Use   Vaping Use: Never used  Substance Use Topics   Alcohol use: Not Currently    Alcohol/week: 3.0 standard drinks    Types: 3 Cans of beer per week   Drug use: Not Currently    Types: Marijuana    Home Medications Prior to Admission medications   Medication Sig Start Date End Date Taking? Authorizing Provider  lactulose, encephalopathy, (CHRONULAC) 10 GM/15ML SOLN Take 30 mLs (20 g total) by mouth daily. 04/06/21   Milus Banister, MD  levETIRAcetam (KEPPRA) 500 MG tablet Take 1 tablet (500 mg total) by mouth 2 (two) times daily. 10/03/20 01/01/21  Charlott Rakes, MD  Menatetrenone (VITAMIN K2) 100  MCG TABS TAKE 1 TABLET (100 MCG TOTAL) BY MOUTH DAILY. FOR 60 DAYS PER PATIENT 07/10/20 07/10/21  Terrilee Croak, MD  naltrexone (DEPADE) 50 MG tablet Take 1 tablet (50 mg total) by mouth daily. 10/03/20   Charlott Rakes, MD  predniSONE (DELTASONE) 10 MG tablet take 5 tablets by mouth 13 hours prior to your procedure at 9:00pm and then take 5 tablets at 3:00am and then take 5 tablets at 9:00am 11/15/20   Milus Banister, MD    Allergies    Iohexol  Review of Systems   Review of Systems  Constitutional:  Positive for fatigue and  unexpected weight change. Negative for fever.  HENT:  Negative for ear pain and sore throat.   Eyes:  Negative for visual disturbance.  Respiratory:  Negative for cough and shortness of breath.   Cardiovascular:  Negative for chest pain.  Gastrointestinal:  Positive for abdominal pain. Negative for diarrhea, nausea and vomiting.  Endocrine: Positive for polydipsia and polyuria.  Genitourinary:  Negative for dysuria and hematuria.  Musculoskeletal:  Negative for back pain.  Skin:  Negative for rash.  Neurological:  Negative for headaches.  All other systems reviewed and are negative.  Physical Exam Updated Vital Signs BP 117/82   Pulse 91   Temp 97.8 F (36.6 C) (Oral)   Resp 12   Ht 5\' 10"  (1.778 m)   Wt 59 kg   SpO2 99%   BMI 18.65 kg/m   Physical Exam Vitals and nursing note reviewed.  Constitutional:      Appearance: He is well-developed.     Comments: Chronically ill appearing  HENT:     Head: Normocephalic and atraumatic.     Mouth/Throat:     Mouth: Mucous membranes are dry.  Eyes:     Conjunctiva/sclera: Conjunctivae normal.  Cardiovascular:     Rate and Rhythm: Normal rate and regular rhythm.     Heart sounds: Normal heart sounds. No murmur heard. Pulmonary:     Effort: Pulmonary effort is normal. No respiratory distress.     Breath sounds: Normal breath sounds. No wheezing, rhonchi or rales.  Abdominal:     General: Bowel sounds are normal.     Palpations: Abdomen is soft.     Tenderness: There is no abdominal tenderness. There is no guarding or rebound.  Musculoskeletal:     Cervical back: Neck supple.  Skin:    General: Skin is warm and dry.  Neurological:     Mental Status: He is alert.    ED Results / Procedures / Treatments   Labs (all labs ordered are listed, but only abnormal results are displayed) Labs Reviewed  CBC WITH DIFFERENTIAL/PLATELET - Abnormal; Notable for the following components:      Result Value   RBC 3.34 (*)    Hemoglobin  8.7 (*)    HCT 26.9 (*)    RDW 21.8 (*)    Platelets 122 (*)    All other components within normal limits  COMPREHENSIVE METABOLIC PANEL - Abnormal; Notable for the following components:   Sodium 134 (*)    Potassium 3.1 (*)    Chloride 95 (*)    Glucose, Bld 380 (*)    Creatinine, Ser 0.43 (*)    Calcium 8.6 (*)    Total Protein 8.5 (*)    Albumin 3.4 (*)    Alkaline Phosphatase 174 (*)    Total Bilirubin 3.7 (*)    Anion gap 16 (*)    All other  components within normal limits  URINALYSIS, ROUTINE W REFLEX MICROSCOPIC - Abnormal; Notable for the following components:   Glucose, UA >=500 (*)    All other components within normal limits  BLOOD GAS, VENOUS - Abnormal; Notable for the following components:   pO2, Ven 46.6 (*)    All other components within normal limits  BETA-HYDROXYBUTYRIC ACID - Abnormal; Notable for the following components:   Beta-Hydroxybutyric Acid 0.82 (*)    All other components within normal limits  MAGNESIUM - Abnormal; Notable for the following components:   Magnesium 1.6 (*)    All other components within normal limits  CBG MONITORING, ED - Abnormal; Notable for the following components:   Glucose-Capillary 465 (*)    All other components within normal limits  CBG MONITORING, ED - Abnormal; Notable for the following components:   Glucose-Capillary 285 (*)    All other components within normal limits  LIPASE, BLOOD  TROPONIN I (HIGH SENSITIVITY)  TROPONIN I (HIGH SENSITIVITY)    EKG EKG Interpretation  Date/Time:  Sunday May 06 2021 16:44:17 EDT Ventricular Rate:  98 PR Interval:  174 QRS Duration: 90 QT Interval:  382 QTC Calculation: 488 R Axis:   59 Text Interpretation: Sinus rhythm Borderline prolonged QT interval Otherwise within normal limits Confirmed by Lockwood, Robert ([id number]) on 05/06/2021 9:29:45 PM  Radiology DG Chest 2 View  Result Date: 05/06/2021 CLINICAL DATA:  Chest pain. EXAM: CHEST - 2 VIEW COMPARISON:  September 17, 2020. FINDINGS: The heart size and mediastinal contours are within normal limits. Both lungs are clear. The visualized skeletal structures are unremarkable. IMPRESSION: No active cardiopulmonary disease. Electronically Signed   By: James  Green Jr M.D.   On: 05/06/2021 17:36    Procedures Procedures   Medications Ordered in ED Medications  potassium chloride 10 mEq in 100 mL IVPB (10 mEq Intravenous New Bag/Given 05/06/21 2136)  metFORMIN (GLUCOPHAGE) tablet 1,000 mg (has no administration in time range)  insulin aspart (novoLOG) injection 4 Units (has no administration in time range)  sodium chloride 0.9 % bolus 1,000 mL (0 mLs Intravenous Stopped 05/06/21 2136)  sodium chloride 0.9 % bolus 1,000 mL (1,000 mLs Intravenous New Bag/Given 05/06/21 1937)  potassium chloride SA (KLOR-CON) CR tablet 40 mEq (40 mEq Oral Given 05/06/21 2142)    ED Course  I have reviewed the triage vital signs and the nursing notes.  Pertinent labs & imaging results that were available during my care of the patient were reviewed by me and considered in my medical decision making (see chart for details).    MDM Rules/Calculators/A&P                          50  yo male presenting for fatigue, polyuria and polydipsia  Reviewed/interpreted labs Initial cbg >400 CBC with no leukocytosis, anemia present, throbocytopenia present CMP with hypokalemia, hyperglycemia, normal bicarb and no elevated anion gap, bili elevated which is consistent with prior Lipase wnl Trop neg UA with glucosuria w/o ketonuria VBG w/o acidemia  EKG - with NSR, borderline prolonged QT  Reviewed/interpreted imaging CXR - No active cardiopulmonary disease.   Pt was given ivf, insulin and potassium. His BS are improving. At shift change, care transitioned to [facility], PA-C. Pt safe for dispo home once potassium is in.   Final Clinical Impression(s) / ED Diagnoses Final diagnoses:  Hyperglycemia    Rx / DC Orders ED  Discharge Orders     None  [facility] [place] 05/06/21 2220    Carmin Muskrat, MD 05/09/21 [id number]

## 2021-05-06 NOTE — Discharge Instructions (Addendum)
Your blood work show that your sugar is high in the emergency department suggesting that you have diabetes, we are starting you on metformin for this, please take this as directed.  Your blood work also showed low potassium and magnesium levels, please include potassium and magnesium rich foods in your diet.   Make sure you are staying well hydrated   Please follow up with your primary care provider within 3-5 days for re-evaluation of your symptoms. If you do not have a primary care provider, information for a healthcare clinic has been provided for you to make arrangements for follow up care. Please return to the emergency department for any new or worsening symptoms.

## 2021-05-06 NOTE — ED Triage Notes (Signed)
Pt states he is not a diabetic and takes no meds at home other than meds for his seizure disorder.  Last seizure " couple months ago"  Pt has been lethargic for a few days.  EMS blood sugar read "high"

## 2021-05-06 NOTE — ED Notes (Signed)
IV team unsuccessful.  Awaiting next shift.  EDPs aware.

## 2021-05-07 ENCOUNTER — Other Ambulatory Visit: Payer: Self-pay

## 2021-05-07 LAB — CBG MONITORING, ED: Glucose-Capillary: 250 mg/dL — ABNORMAL HIGH (ref 70–99)

## 2021-05-07 MED ORDER — POTASSIUM CHLORIDE CRYS ER 20 MEQ PO TBCR
20.0000 meq | EXTENDED_RELEASE_TABLET | Freq: Every day | ORAL | 0 refills | Status: DC
  Filled 2021-05-07: qty 5, 5d supply, fill #0

## 2021-05-07 MED ORDER — POTASSIUM CHLORIDE CRYS ER 20 MEQ PO TBCR
20.0000 meq | EXTENDED_RELEASE_TABLET | Freq: Every day | ORAL | 0 refills | Status: DC
  Filled 2021-05-07: qty 3, 3d supply, fill #0

## 2021-05-07 NOTE — ED Provider Notes (Signed)
22:30: Assumed care of patient from [facility] pending additional fluids & IV electrolyte replacement.   Please see prior provider for full H&P. Briefly patient is a 50 year old male who presented to the emergency department with complaints of fatigue and hyperglycemia.   Likely new onset diabetes. Given fluids and insulin to help with blood sugar control.  At change of shift is to complete fluid resuscitation and electrolyte replacement, recheck blood sugar, and likely discharge home  Patient has received IV fluids and potassium, he has also been given IV magnesium.  His repeat blood sugar is 250 following fluids and insulin, he is also eating a sandwich in the emergency department.  He is feeling improved.  Overall appears appropriate for discharge.  Results for orders placed or performed during the hospital encounter of 05/06/21  CBC with Differential  Result Value Ref Range   WBC 5.7 4.0 - 10.5 K/uL   RBC 3.34 (L) 4.22 - 5.81 MIL/uL   Hemoglobin 8.7 (L) 13.0 - 17.0 g/dL   HCT 26.9 (L) 39.0 - 52.0 %   MCV 80.5 80.0 - 100.0 fL   MCH 26.0 26.0 - 34.0 pg   MCHC 32.3 30.0 - 36.0 g/dL   RDW 21.8 (H) 11.5 - 15.5 %   Platelets 122 (L) 150 - 400 K/uL   nRBC 0.0 0.0 - 0.2 %   Neutrophils Relative % 51 %   Neutro Abs 2.9 1.7 - 7.7 K/uL   Lymphocytes Relative 36 %   Lymphs Abs 2.1 0.7 - 4.0 K/uL   Monocytes Relative 9 %   Monocytes Absolute 0.5 0.1 - 1.0 K/uL   Eosinophils Relative 2 %   Eosinophils Absolute 0.1 0.0 - 0.5 K/uL   Basophils Relative 2 %   Basophils Absolute 0.1 0.0 - 0.1 K/uL   Immature Granulocytes 0 %   Abs Immature Granulocytes 0.01 0.00 - 0.07 K/uL   Target Cells PRESENT   Comprehensive metabolic panel  Result Value Ref Range   Sodium 134 (L) 135 - 145 mmol/L   Potassium 3.1 (L) 3.5 - 5.1 mmol/L   Chloride 95 (L) 98 - 111 mmol/L   CO2 23 22 - 32 mmol/L   Glucose, Bld 380 (H) 70 - 99 mg/dL   BUN 6 6 - 20 mg/dL   Creatinine, Ser 0.43 (L) 0.61 - 1.24 mg/dL    Calcium 8.6 (L) 8.9 - 10.3 mg/dL   Total Protein 8.5 (H) 6.5 - 8.1 g/dL   Albumin 3.4 (L) 3.5 - 5.0 g/dL   AST 39 15 - 41 U/L   ALT 15 0 - 44 U/L   Alkaline Phosphatase 174 (H) 38 - 126 U/L   Total Bilirubin 3.7 (H) 0.3 - 1.2 mg/dL   GFR, Estimated >60 >60 mL/min   Anion gap 16 (H) 5 - 15  Lipase, blood  Result Value Ref Range   Lipase 50 11 - 51 U/L  Urinalysis, Routine w reflex microscopic Urine, Clean Catch  Result Value Ref Range   Color, Urine YELLOW YELLOW   APPearance CLEAR CLEAR   Specific Gravity, Urine 1.027 1.005 - 1.030   pH 6.0 5.0 - 8.0   Glucose, UA >=500 (A) NEGATIVE mg/dL   Hgb urine dipstick NEGATIVE NEGATIVE   Bilirubin Urine NEGATIVE NEGATIVE   Ketones, ur NEGATIVE NEGATIVE mg/dL   Protein, ur NEGATIVE NEGATIVE mg/dL   Nitrite NEGATIVE NEGATIVE   Leukocytes,Ua NEGATIVE NEGATIVE   RBC / HPF 0-5 0 - 5 RBC/hpf   Bacteria, UA NONE  SEEN NONE SEEN  Blood gas, venous (at [facility] and [facility], not at [facility])  Result Value Ref Range   pH, Ven 7.338 7.250 - 7.430   pCO2, Ven 44.4 44.0 - 60.0 mmHg   pO2, Ven 46.6 (H) 32.0 - 45.0 mmHg   Bicarbonate 23.2 20.0 - 28.0 mmol/L   Acid-base deficit 1.9 0.0 - 2.0 mmol/L   O2 Saturation 66.6 %   Patient temperature 37.0   Beta-hydroxybutyric acid  Result Value Ref Range   Beta-Hydroxybutyric Acid 0.82 (H) 0.05 - 0.27 mmol/L  Magnesium  Result Value Ref Range   Magnesium 1.6 (L) 1.7 - 2.4 mg/dL  CBG monitoring, ED  Result Value Ref Range   Glucose-Capillary 465 (H) 70 - 99 mg/dL  CBG monitoring, ED  Result Value Ref Range   Glucose-Capillary 285 (H) 70 - 99 mg/dL  POC CBG, ED  Result Value Ref Range   Glucose-Capillary 250 (H) 70 - 99 mg/dL  Troponin I (High Sensitivity)  Result Value Ref Range   Troponin I (High Sensitivity) 10 <18 ng/L   DG Chest 2 View  Result Date: 05/06/2021 CLINICAL DATA:  Chest pain. EXAM: CHEST - 2 VIEW COMPARISON:  September 17, 2020. FINDINGS: The heart size and mediastinal contours are within normal  limits. Both lungs are clear. The visualized skeletal structures are unremarkable. IMPRESSION: No active cardiopulmonary disease. Electronically Signed   By: Marijo Conception M.D.   On: 05/06/2021 17:36      Amaryllis Dyke, PA-C 05/07/21 0010    Fatima Blank, MD 05/10/21 1729

## 2021-05-29 ENCOUNTER — Other Ambulatory Visit: Payer: Self-pay

## 2021-05-29 ENCOUNTER — Ambulatory Visit: Payer: Self-pay | Attending: Family Medicine | Admitting: Family Medicine

## 2021-05-29 ENCOUNTER — Encounter: Payer: Self-pay | Admitting: Family Medicine

## 2021-05-29 VITALS — BP 143/92 | HR 97 | Ht 64.0 in | Wt 130.2 lb

## 2021-05-29 DIAGNOSIS — K709 Alcoholic liver disease, unspecified: Secondary | ICD-10-CM

## 2021-05-29 DIAGNOSIS — E1165 Type 2 diabetes mellitus with hyperglycemia: Secondary | ICD-10-CM

## 2021-05-29 DIAGNOSIS — E119 Type 2 diabetes mellitus without complications: Secondary | ICD-10-CM | POA: Insufficient documentation

## 2021-05-29 DIAGNOSIS — Z1211 Encounter for screening for malignant neoplasm of colon: Secondary | ICD-10-CM

## 2021-05-29 DIAGNOSIS — K8689 Other specified diseases of pancreas: Secondary | ICD-10-CM

## 2021-05-29 DIAGNOSIS — E876 Hypokalemia: Secondary | ICD-10-CM

## 2021-05-29 DIAGNOSIS — K219 Gastro-esophageal reflux disease without esophagitis: Secondary | ICD-10-CM

## 2021-05-29 LAB — POCT GLYCOSYLATED HEMOGLOBIN (HGB A1C): HbA1c, POC (controlled diabetic range): 11.1 % — AB (ref 0.0–7.0)

## 2021-05-29 LAB — GLUCOSE, POCT (MANUAL RESULT ENTRY): POC Glucose: 411 mg/dl — AB (ref 70–99)

## 2021-05-29 MED ORDER — PANTOPRAZOLE SODIUM 40 MG PO TBEC
40.0000 mg | DELAYED_RELEASE_TABLET | Freq: Every day | ORAL | 3 refills | Status: DC
  Filled 2021-05-29: qty 30, 30d supply, fill #0
  Filled 2021-07-09: qty 30, 30d supply, fill #1
  Filled 2021-09-14: qty 30, 30d supply, fill #0

## 2021-05-29 MED ORDER — METFORMIN HCL 500 MG PO TABS
1000.0000 mg | ORAL_TABLET | Freq: Two times a day (BID) | ORAL | 3 refills | Status: DC
  Filled 2021-05-29: qty 120, 30d supply, fill #0
  Filled 2021-07-09: qty 120, 30d supply, fill #1
  Filled 2021-09-14: qty 120, 30d supply, fill #0

## 2021-05-29 MED ORDER — GLIPIZIDE 5 MG PO TABS
5.0000 mg | ORAL_TABLET | Freq: Two times a day (BID) | ORAL | 3 refills | Status: DC
Start: 2021-05-29 — End: 2021-08-22
  Filled 2021-05-29: qty 60, 30d supply, fill #0
  Filled 2021-07-09: qty 60, 30d supply, fill #1

## 2021-05-29 MED ORDER — ATORVASTATIN CALCIUM 20 MG PO TABS
20.0000 mg | ORAL_TABLET | Freq: Every day | ORAL | 3 refills | Status: DC
  Filled 2021-05-29: qty 30, 30d supply, fill #0
  Filled 2021-07-09: qty 30, 30d supply, fill #1
  Filled 2021-09-14: qty 30, 30d supply, fill #0

## 2021-05-29 NOTE — Progress Notes (Signed)
Subjective:  Patient ID: Gary Mitchell, male    DOB: February 26, 1971  Age: 50 y.o. MRN: [id number]  CC: Hospitalization Follow-up   HPI Gary Mitchell is a 50 y.o. year old male with a history of alcoholic liver disease, pancreatitis, history of alcoholic hepatitis in 07/1029, alcohol-related seizures, type 2 diabetes mellitus (A1c of 11.1) who presents today for follow-up. Last seen at the ED on 05/07/2021 for hyperglycemia.  Interval History: He presents today for management of his diabetes mellitus and endorses compliance with metformin.  Denies presence of paresthesia, hypoglycemia.  Blood sugar is 411.  With regards to his pancreatitis and alcoholic hepatitis.  He has not seen GI since 11/2019. Denies presence of hematemesis, hematochezia. He was previously scheduled for EUS but states he did not have transportation hence he could not make that procedure. He is anemic with hgb of 8.6  CT abdomen and pelvis from 11/2020 revealed: IMPRESSION: 1. Interval increase in size of the circumscribed fluid collection involving the body of the pancreas now measuring 4.7 cm with possible ductal connection. Additionally there is further dilation of the pancreatic duct in the body/tail distal to the likely cysts is with increased atrophy of the pancreatic parenchyma in this region, likely representing sequela of pancreatitis with possible ductal stricture. Further assessment with endoscopic ultrasound evaluation is suggested. 2. Nodular appearance of the pancreatic head with relative hypoenhancement, findings which may represent sequela of pancreatitis however underlying pancreatic neoplasm not excluded. Suggest further assessment with endoscopic ultrasound evaluation. 3. Hepatomegaly with hepatic steatosis and subtle contour nodularity, suggestive of cirrhosis. No arterially enhancing hepatic lesion. Findings of portal venous hypertension with portal venous collaterals.    He complains of  reflux and has been out of his PPI.  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   Seizures ([place])    Thrombocytopenia ([place])     Past Surgical History:  Procedure Laterality Date   ENTEROSCOPY N/A 48/16/2022   Procedure: ENTEROSCOPY;  Surgeon: Thornton Park, MD;  Location: [facility];  Service: Gastroenterology;  Laterality: N/A;   left kidney removed      Family History  Problem Relation Age of Onset   Diabetes Mellitus II Mother    Colon cancer Neg Hx    Esophageal cancer Neg Hx     Allergies  Allergen Reactions   Iohexol Other (See Comments)    Unknown reaction at 15 days old Mom at bedside reported patient was given injections of iohexol in foot and resulted in "hole in foot"    Outpatient Medications Prior to Visit  Medication Sig Dispense Refill   lactulose, encephalopathy, (CHRONULAC) 10 GM/15ML SOLN Take 30 mLs (20 g total) by mouth daily. 900 mL 0   levETIRAcetam (KEPPRA) 500 MG tablet Take 1 tablet (500 mg total) by mouth 2 (two) times daily. 60 tablet 3   potassium chloride SA (KLOR-CON) 20 MEQ tablet Take 1 tablet (20 mEq total) by mouth daily. 3 tablet 0   pantoprazole (PROTONIX) 40 MG tablet Take 40 mg by mouth daily.     Multiple Vitamin (MULTIVITAMIN) tablet  Take 1 tablet by mouth at bedtime.     naltrexone (DEPADE) 50 MG tablet Take 1 tablet (50 mg total) by mouth daily. (Patient not taking: Reported on 05/29/2021) 30 tablet 3   metFORMIN (GLUCOPHAGE-XR) 500 MG 24 hr tablet Take 1 tablet (500 mg total) by mouth daily with dinner. 30 tablet 0   No  facility-administered medications prior to visit.     ROS Review of Systems  Constitutional:  Negative for activity change and appetite change.  HENT:  Negative for sinus pressure and sore throat.   Eyes:  Negative for visual disturbance.  Respiratory:  Negative for cough, chest tightness and shortness of breath.   Cardiovascular:  Negative for chest pain and leg swelling.  Gastrointestinal:  Negative for abdominal distention, abdominal pain, constipation and diarrhea.  Endocrine: Negative.   Genitourinary:  Negative for dysuria.  Musculoskeletal:  Negative for joint swelling and myalgias.  Skin:  Negative for rash.  Allergic/Immunologic: Negative.   Neurological:  Negative for weakness, light-headedness and numbness.  Psychiatric/Behavioral:  Negative for dysphoric mood and suicidal ideas.    Objective:  BP (!) 143/92   Pulse 97   Ht 5\' 4"  (1.626 m)   Wt 130 lb 3.2 oz (59.1 kg)   BMI 22.35 kg/m   BP/Weight 05/29/2021 05/06/2021 08/07/8655  Systolic BP 846 962 952  Diastolic BP 92 89 74  Wt. (Lbs) 130.2 130 132  BMI 22.35 18.65 21.31      Physical Exam Constitutional:      Appearance: He is well-developed.  Eyes:     General: Scleral icterus present.  Cardiovascular:     Rate and Rhythm: Normal rate.     Heart sounds: Normal heart sounds. No murmur heard. Pulmonary:     Effort: Pulmonary effort is normal.     Breath sounds: Normal breath sounds. No wheezing or rales.  Chest:     Chest wall: No tenderness.  Abdominal:     General: Bowel sounds are normal. There is distension.     Palpations: Abdomen is soft. There is no mass.     Tenderness: There is no abdominal tenderness.  Musculoskeletal:        General: Normal range of motion.     Right lower leg: No edema.     Left lower leg: No edema.  Neurological:     Mental Status: He is alert and oriented to person, place, and time.  Psychiatric:        Mood and Affect: Mood normal.    CMP Latest Ref Rng &  Units 05/06/2021 11/15/2020 10/03/2020  Glucose 70 - 99 mg/dL 380(H) 475(H) 157(H)  BUN 6 - 20 mg/dL 6 7 5(L)  Creatinine 0.61 - 1.24 mg/dL 0.43(L) 0.62 0.62(L)  Sodium 135 - 145 mmol/L 134(L) 128(L) 135  Potassium 3.5 - 5.1 mmol/L 3.1(L) 4.2 3.8  Chloride 98 - 111 mmol/L 95(L) 94(L) 98  CO2 22 - 32 mmol/L 23 22 19(L)  Calcium 8.9 - 10.3 mg/dL 8.6(L) 10.0 9.0  Total Protein 6.5 - 8.1 g/dL 8.5(H) 9.0(H) 7.6  Total Bilirubin 0.3 - 1.2 mg/dL 3.7(H) 3.7(H) 11.4(H)  Alkaline Phos 38 - 126 U/L 174(H) 160(H) 251(H)  AST 15 - 41 U/L 39 26 39  ALT 0 - 44 U/L 15 13 11     Lipid Panel     Component Value Date/Time   TRIG 120 07/09/2020 1240    CBC    Component Value Date/Time   WBC 5.7 05/06/2021 1930   RBC  3.34 (L) 05/06/2021 1930   HGB 8.7 (L) 05/06/2021 1930   HGB 10.6 (L) 10/03/2020 1127   HCT 26.9 (L) 05/06/2021 1930   HCT 30.0 (L) 10/03/2020 1127   PLT 122 (L) 05/06/2021 1930   PLT 301 10/03/2020 1127   MCV 80.5 05/06/2021 1930   MCV 74 (L) 10/03/2020 1127   MCH 26.0 05/06/2021 1930   MCHC 32.3 05/06/2021 1930   RDW 21.8 (H) 05/06/2021 1930   RDW 21.1 (H) 10/03/2020 1127   LYMPHSABS 2.1 05/06/2021 1930   LYMPHSABS 2.2 10/03/2020 1127   MONOABS 0.5 05/06/2021 1930   EOSABS 0.1 05/06/2021 1930   EOSABS 0.1 10/03/2020 1127   BASOSABS 0.1 05/06/2021 1930   BASOSABS 0.2 10/03/2020 1127    Lab Results  Component Value Date   HGBA1C 11.1 (A) 05/29/2021    Assessment & Plan:  1. Type 2 diabetes mellitus with hyperglycemia, without long-term current use of insulin (HCC) Uncontrolled with A1c of 11.1; goal is less than 7.0 Will increase metformin dose and add on glipizide to regimen We have spoken with the pharmacy to provide his medications today Counseled on Diabetic diet, my plate method, 097 minutes of moderate intensity exercise/week Blood sugar logs with fasting goals of 80-120 mg/dl, random of less than 180 and in the event of sugars less than 60 mg/dl or greater than  400 mg/dl encouraged to notify the clinic. Advised on the need for annual eye exams, annual foot exams, Pneumonia vaccine. - POCT glucose (manual entry) - POCT glycosylated hemoglobin (Hb A1C) - glipiZIDE (GLUCOTROL) 5 MG tablet; Take 1 tablet (5 mg total) by mouth 2 (two) times daily before a meal.  Dispense: 60 tablet; Refill: 3 - Basic Metabolic Panel - metFORMIN (GLUCOPHAGE) 500 MG tablet; Take 2 tablets (1,000 mg total) by mouth 2 (two) times daily with a meal.  Dispense: 120 tablet; Refill: 3 - atorvastatin (LIPITOR) 20 MG tablet; Take 1 tablet (20 mg total) by mouth daily.  Dispense: 30 tablet; Refill: 3  2. Screening for colon cancer - Fecal occult blood, imunochemical  3. Alcoholic liver disease (HCC)/ pancreatic mass He is icteric and has some stigmata of chronic liver disease Has not followed up with GI and has been encouraged to call his gastroenterologist to set up an appointment as urgent attention needs to be paid to the pancreatic mass concerning for malignancy  4. Hypokalemia Last potassium was 3.1 He is currently not taking potassium tablets We will check potassium and replenish as needed.  5. Gastroesophageal reflux disease without esophagitis Uncontrolled Initiate PPI Advised to avoid recumbency up to 2 hours postmeal, avoid late meals, avoid foods that trigger symptoms. - pantoprazole (PROTONIX) 40 MG tablet; Take 1 tablet (40 mg total) by mouth daily.  Dispense: 30 tablet; Refill: 3   Meds ordered this encounter  Medications   glipiZIDE (GLUCOTROL) 5 MG tablet    Sig: Take 1 tablet (5 mg total) by mouth 2 (two) times daily before a meal.    Dispense:  60 tablet    Refill:  3   metFORMIN (GLUCOPHAGE) 500 MG tablet    Sig: Take 2 tablets (1,000 mg total) by mouth 2 (two) times daily with a meal.    Dispense:  120 tablet    Refill:  3   atorvastatin (LIPITOR) 20 MG tablet    Sig: Take 1 tablet (20 mg total) by mouth daily.    Dispense:  30 tablet    Refill:   3   pantoprazole (  PROTONIX) 40 MG tablet    Sig: Take 1 tablet (40 mg total) by mouth daily.    Dispense:  30 tablet    Refill:  3     Follow-up: Return in about 1 month (around 06/28/2021) for Diabetes.       Charlott Rakes, MD, FAAFP. [facility] and [place], [place]   05/29/2021, 5:21 PM

## 2021-05-29 NOTE — Progress Notes (Signed)
Blood glucose 411 a1c 11.1

## 2021-05-30 ENCOUNTER — Telehealth: Payer: Self-pay

## 2021-05-30 LAB — BASIC METABOLIC PANEL
BUN/Creatinine Ratio: 16 (ref 9–20)
BUN: 9 mg/dL (ref 6–24)
CO2: 21 mmol/L (ref 20–29)
Calcium: 8.8 mg/dL (ref 8.7–10.2)
Chloride: 92 mmol/L — ABNORMAL LOW (ref 96–106)
Creatinine, Ser: 0.55 mg/dL — ABNORMAL LOW (ref 0.76–1.27)
Glucose: 499 mg/dL — ABNORMAL HIGH (ref 70–99)
Potassium: 4.3 mmol/L (ref 3.5–5.2)
Sodium: 129 mmol/L — ABNORMAL LOW (ref 134–144)
eGFR: 122 mL/min/{1.73_m2} (ref >59)

## 2021-05-30 NOTE — Telephone Encounter (Signed)
Patient name and DOB has been verified Patient was informed of lab results. Patient had no questions.  

## 2021-05-30 NOTE — Telephone Encounter (Signed)
-----   Message from Charlott Rakes, MD sent at 05/30/2021 12:17 PM EST ----- Please inform him labs are stable except for severely elevated glucose which we had addressed at his office visit.  I will see him at his upcoming visit for follow-up.

## 2021-06-21 ENCOUNTER — Encounter: Payer: Self-pay | Admitting: Physician Assistant

## 2021-06-21 ENCOUNTER — Other Ambulatory Visit: Payer: Self-pay

## 2021-06-21 ENCOUNTER — Other Ambulatory Visit (INDEPENDENT_AMBULATORY_CARE_PROVIDER_SITE_OTHER): Payer: Self-pay

## 2021-06-21 ENCOUNTER — Ambulatory Visit (INDEPENDENT_AMBULATORY_CARE_PROVIDER_SITE_OTHER): Payer: Self-pay | Admitting: Physician Assistant

## 2021-06-21 VITALS — BP 120/70 | HR 94 | Ht 64.0 in | Wt 133.4 lb

## 2021-06-21 DIAGNOSIS — Q453 Other congenital malformations of pancreas and pancreatic duct: Secondary | ICD-10-CM

## 2021-06-21 DIAGNOSIS — K219 Gastro-esophageal reflux disease without esophagitis: Secondary | ICD-10-CM

## 2021-06-21 DIAGNOSIS — K7030 Alcoholic cirrhosis of liver without ascites: Secondary | ICD-10-CM

## 2021-06-21 LAB — COMPREHENSIVE METABOLIC PANEL
ALT: 18 U/L (ref 0–53)
AST: 35 U/L (ref 0–37)
Albumin: 3.2 g/dL — ABNORMAL LOW (ref 3.5–5.2)
Alkaline Phosphatase: 171 U/L — ABNORMAL HIGH (ref 39–117)
BUN: 6 mg/dL (ref 6–23)
CO2: 20 mEq/L (ref 19–32)
Calcium: 9.2 mg/dL (ref 8.4–10.5)
Chloride: 99 mEq/L (ref 96–112)
Creatinine, Ser: 0.61 mg/dL (ref 0.40–1.50)
GFR: 113.52 mL/min (ref >60.00)
Glucose, Bld: 467 mg/dL — ABNORMAL HIGH (ref 70–99)
Potassium: 3.4 mEq/L — ABNORMAL LOW (ref 3.5–5.1)
Sodium: 130 mEq/L — ABNORMAL LOW (ref 135–145)
Total Bilirubin: 3.5 mg/dL — ABNORMAL HIGH (ref 0.2–1.2)
Total Protein: 7.7 g/dL (ref 6.0–8.3)

## 2021-06-21 LAB — CBC WITH DIFFERENTIAL/PLATELET
Basophils Absolute: 0 10*3/uL (ref 0.0–0.1)
Basophils Relative: 0.9 % (ref 0.0–3.0)
Eosinophils Absolute: 0.1 10*3/uL (ref 0.0–0.7)
Eosinophils Relative: 1 % (ref 0.0–5.0)
HCT: 29.4 % — ABNORMAL LOW (ref 39.0–52.0)
Hemoglobin: 9.3 g/dL — ABNORMAL LOW (ref 13.0–17.0)
Lymphocytes Relative: 25.6 % (ref 12.0–46.0)
Lymphs Abs: 1.5 10*3/uL (ref 0.7–4.0)
MCHC: 31.8 g/dL (ref 30.0–36.0)
MCV: 80.5 fl (ref 78.0–100.0)
Monocytes Absolute: 0.9 10*3/uL (ref 0.1–1.0)
Monocytes Relative: 15.3 % — ABNORMAL HIGH (ref 3.0–12.0)
Neutro Abs: 3.3 10*3/uL (ref 1.4–7.7)
Neutrophils Relative %: 57.2 % (ref 43.0–77.0)
Platelets: 107 10*3/uL — ABNORMAL LOW (ref 150.0–400.0)
RBC: 3.65 Mil/uL — ABNORMAL LOW (ref 4.22–5.81)
RDW: 25.6 % — ABNORMAL HIGH (ref 11.5–15.5)
WBC: 5.7 10*3/uL (ref 4.0–10.5)

## 2021-06-21 LAB — PROTIME-INR
INR: 1.7 ratio — ABNORMAL HIGH (ref 0.8–1.0)
Prothrombin Time: 18.3 s — ABNORMAL HIGH (ref 9.6–13.1)

## 2021-06-21 MED ORDER — LACTULOSE ENCEPHALOPATHY 10 GM/15ML PO SOLN
20.0000 g | Freq: Every day | ORAL | 3 refills | Status: DC
  Filled 2021-06-21 – 2021-09-14 (×2): qty 946, 31d supply, fill #0

## 2021-06-21 NOTE — Progress Notes (Signed)
I agree with the above note,plan. EUS at [facility]

## 2021-06-21 NOTE — Progress Notes (Signed)
Chief Complaint: Follow-up alcoholic cirrhosis  HPI:    Mr. Edmonds is a 50 year old African-American male with a past medical history of alcoholic cirrhosis and acute alcoholic hepatitis as well as a pancreatic lesion, known to Dr. Ardis Hughs, who presents to clinic today for follow-up of his alcoholic cirrhosis.    09/20/2020 EGD with grade 1 esophageal varices with no bleeding or stigmata of bleeding, portal hypertensive gastropathy, deformity in the prepyloric region of the stomach suggesting prior ulcer disease.    11/15/2020 office visit with Dr. Ardis Hughs for follow-up of alcoholic cirrhosis.  At that time discussed that he had been in the hospital for 3 to 4 days in March with melena and acute on chronic anemia, seizures and underwent EGD as above.  His meld score was calculated 18.  At that visit weight was down 21 pounds and his last drink of vodka was 2 weeks prior.  He had not really been taking his medicines.  It was recommended that he abstain from alcohol forever.  Also restaging of his liver disease with CBC, CMP, coags and alpha-fetoprotein.  He was also started on Lactulose 30 cc once daily for confusion at times.  He had a CT scan of the abdomen pelvis with IV and oral contrast ordered given abnormal pancreas on imaging in the past.    11/29/2020 CT of the abdomen pelvis with and without contrast showed interval increase in the size of circumscribed fluid collection involving the body of the pancreas now measuring 4.7 cm with possible ductal connection, additionally further dilation of the pancreatic duct in the body/tail distal also likely cysts with increased atrophy of the pancreatic parenchyma, likely representing sequela pancreatitis with possible ductal stricture, EUS recommended.  Also nodular appearance of the pancreatic head with relative hypoenhancement, findings suggest sequela of pancreatitis however underlying pancreatic neoplasm not excluded.  Hepatomegaly with hepatic steatosis and  subtle contour nodularity suggestive of cirrhosis.  Findings of portal venous hypertension with portal venous collaterals.  Dr. Ardis Hughs recommended an EUS.    Today, the patient presents to clinic and tells me that he is sorry he didn't make his appointment for EUS , he was having financial and legal problems and could not make it to the hospital.  Tells me that since then he has continued to feel okay.  He recently went to see his PCP who recommended that he follow-up with [place].  Tells me that he had been drinking again until about 3 weeks ago and has not had any etoh since then.  He had been off of all of his medications and was having some reflux symptoms but was restarted on Pantoprazole 40 mg once daily by his PCP and this is helped.  Tells me he does have Lactulose at home but typically has 3-4 bowel movements on a regular basis anyways so he does not use this all the time.  Denies any increase in abdominal distention or leg swelling.    Denies fever, chills, weight loss, blood in his stool, abdominal pain, change in bowel habits or symptoms that awaken him from sleep.   Review of pertinent gastrointestinal problems: 1.  Alcoholic liver disease; presented with severe alcoholic hepatitis and also alcoholic pancreatitis in the setting of underlying cirrhosis May 202.  MRI May 2021 showed abnormal liver contour, cirrhotic changes of the liver, innumerable small regenerating nodules throughout the liver, evidence of portal hypertension as well with portal venous collaterals. Ultrasound January 2022 cirrhosis no discrete liver lesions.  Ultrasound March 2022  showed no ascites. EGD March 2022 while hospitalized showed small esophageal varices, mild portal gastropathy.  No gastric varices. Presenting total bilirubin 35, AST/ALT ratio greater than 2 1, INR 1.5; discriminant function was high enough that he qualified for oral prednisolone 4-week course Continuing to drink heavily as of March 2022.   2.   Abnormal pancreas.  Presented with acute pancreatitis at the same time as his severe alcoholic hepatitis in May 2021.  Imaging while he was in the hospital also suggested "4 cm ill-defined infiltrating pancreatic body lesion worrisome for infiltrating neoplasm/adenocarcinoma.  This is associated with marked atrophy of the pancreatic tail and dilation of the main pancreatic duct".  Repeat MRI June 2021 was overall much improved.  The "masslike lesion" in the pancreatic body was much less appreciated.  There was a new fluid collection anterior to the pancreatic body look like a pseudocyst.  I recommended follow-up in the office 2 months later, he never scheduled or showed for that appointment.    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   Seizures ([place])    Thrombocytopenia ([place])     Past Surgical History:  Procedure Laterality Date   ENTEROSCOPY N/A 09/20/2020   Procedure: ENTEROSCOPY;  Surgeon: Thornton Park, MD;  Location: [facility];  Service: Gastroenterology;  Laterality: N/A;   left kidney removed      Current Outpatient Medications  Medication Sig Dispense Refill   atorvastatin (LIPITOR) 20 MG tablet Take 1 tablet (20 mg total) by mouth daily. 30 tablet 3   glipiZIDE (GLUCOTROL) 5 MG tablet Take 1 tablet (5 mg total) by mouth 2 (two) times daily before a meal. 60 tablet 3   lactulose, encephalopathy, (CHRONULAC) 10 GM/15ML SOLN Take 30 mLs (20 g total) by mouth daily. 900 mL 0   levETIRAcetam (KEPPRA) 500 MG tablet Take 1 tablet (500 mg total) by mouth 2 (two) times daily. 60 tablet 3    metFORMIN (GLUCOPHAGE) 500 MG tablet Take 2 tablets (1,000 mg total) by mouth 2 (two) times daily with a meal. 120 tablet 3   Multiple Vitamin (MULTIVITAMIN) tablet Take 1 tablet by mouth at bedtime.     naltrexone (DEPADE) 50 MG tablet Take 1 tablet (50 mg total) by mouth daily. 30 tablet 3   pantoprazole (PROTONIX) 40 MG tablet Take 1 tablet (40 mg total) by mouth daily. 30 tablet 3   potassium chloride SA (KLOR-CON) 20 MEQ tablet Take 1 tablet (20 mEq total) by mouth daily. 3 tablet 0   No current facility-administered medications for this visit.    Allergies as of 06/21/2021 - Review Complete 06/21/2021  Allergen Reaction Noted   Iohexol Other (See Comments) 05/21/2016    Family History  Problem Relation Age of Onset   Diabetes Mellitus II Mother    Colon cancer Neg Hx    Esophageal cancer Neg Hx     Social History   Socioeconomic History   Marital status: Single    Spouse name: Not on file   Number of children: Not on file   Years of education: Not on  file   Highest education level: Not on file  Occupational History   Not on file  Tobacco Use   Smoking status: Every Day    Packs/day: 2.00    Years: 25.00    Pack years: 50.00    Types: Cigarettes   Smokeless tobacco: Never  Vaping Use   Vaping Use: Never used  Substance and Sexual Activity   Alcohol use: Not Currently    Alcohol/week: 3.0 standard drinks    Types: 3 Cans of beer per week   Drug use: Not Currently    Types: Marijuana   Sexual activity: Not on file  Other Topics Concern   Not on file  Social History Narrative   Not on file   Social Determinants of Health   Financial Resource Strain: Not on file  Food Insecurity: Not on file  Transportation Needs: Not on file  Physical Activity: Not on file  Stress: Not on file  Social Connections: Not on file  Intimate Partner Violence: Not on file    Review of Systems:    Constitutional: No weight loss, fever or chills Cardiovascular: No chest  pain  Respiratory: No SOB Gastrointestinal: See HPI and otherwise negative   Physical Exam:  Vital signs: BP 120/70    Pulse 94    Ht 5\' 4"  (1.626 m)    Wt 133 lb 6 oz (60.5 kg)    BMI 22.89 kg/m   Constitutional:   Pleasant AA male appears to be in NAD, Well developed, Well nourished, alert and cooperative Head:  Normocephalic and atraumatic. Eyes:   PEERL, EOMI. +icterus. Conjunctiva pink. Ears:  Normal auditory acuity. Neck:  Supple Throat: Oral cavity and pharynx without inflammation, swelling or lesion.  Respiratory: Respirations even and unlabored. Lungs clear to auscultation bilaterally.   No wheezes, crackles, or rhonchi.  Cardiovascular: Normal S1, S2. No MRG. Regular rate and rhythm. No peripheral edema, cyanosis or pallor.  Gastrointestinal:  Soft, mild distension, nontender. No rebound or guarding. Normal bowel sounds. No appreciable masses or hepatomegaly. Rectal:  Not performed.  Msk:  Symmetrical without gross deformities. Without edema, no deformity or joint abnormality.  Neurologic:  Alert and  oriented x4;  grossly normal neurologically.  Skin:   Dry and intact without significant lesions or rashes. Psychiatric: Demonstrates good judgement and reason without abnormal affect or behaviors.  RELEVANT LABS AND IMAGING: CBC    Component Value Date/Time   WBC 5.7 05/06/2021 1930   RBC 3.34 (L) 05/06/2021 1930   HGB 8.7 (L) 05/06/2021 1930   HGB 10.6 (L) 10/03/2020 1127   HCT 26.9 (L) 05/06/2021 1930   HCT 30.0 (L) 10/03/2020 1127   PLT 122 (L) 05/06/2021 1930   PLT 301 10/03/2020 1127   MCV 80.5 05/06/2021 1930   MCV 74 (L) 10/03/2020 1127   MCH 26.0 05/06/2021 1930   MCHC 32.3 05/06/2021 1930   RDW 21.8 (H) 05/06/2021 1930   RDW 21.1 (H) 10/03/2020 1127   LYMPHSABS 2.1 05/06/2021 1930   LYMPHSABS 2.2 10/03/2020 1127   MONOABS 0.5 05/06/2021 1930   EOSABS 0.1 05/06/2021 1930   EOSABS 0.1 10/03/2020 1127   BASOSABS 0.1 05/06/2021 1930   BASOSABS 0.2  10/03/2020 1127    CMP     Component Value Date/Time   NA 129 (L) 05/29/2021 1532   K 4.3 05/29/2021 1532   CL 92 (L) 05/29/2021 1532   CO2 21 05/29/2021 1532   GLUCOSE 499 (H) 05/29/2021 1532   GLUCOSE 380 (H) 05/06/2021  1930   BUN 9 05/29/2021 1532   CREATININE 0.55 (L) 05/29/2021 1532   CALCIUM 8.8 05/29/2021 1532   PROT 8.5 (H) 05/06/2021 1930   PROT 7.6 10/03/2020 1127   ALBUMIN 3.4 (L) 05/06/2021 1930   ALBUMIN 3.2 (L) 10/03/2020 1127   AST 39 05/06/2021 1930   ALT 15 05/06/2021 1930   ALKPHOS 174 (H) 05/06/2021 1930   BILITOT 3.7 (H) 05/06/2021 1930   BILITOT 11.4 (H) 10/03/2020 1127   GFRNONAA >60 05/06/2021 1930   GFRAA 147 01/12/2020 1043    Assessment: 1.  Alcoholic cirrhosis: With portal hypertensive gastropathy and varices, no signs of bleeding today, patient had been drinking again until 3 weeks ago 2.  Abnormal CT of the abdomen: Question of lesion in the pancreas  Plan: 1.  Repeat CBC, CMP, PT/INR and AFP today 2.  Ordered ultrasound of the abdomen 3.  Scheduled patient for an EUS with Dr. Ardis Hughs at the hospital.  Did discuss how important this was for the patient to follow-up to ensure he does not have underlying pancreatic cancer. 4.  Provided the patient with information in regards to [facility] health cares program/red card today.  Explained how this works. 5.  Congratulated him on not drinking for the past 2 weeks and encouraged him to abstain forever. 6.  Continue Pantoprazole 40 mg daily 7.  Discussed Lactulose usage.  If he is having 3-4 bowel movements a day then he does not need to use it, but if he starts to decrease then he should add a dose in. 8.  Patient to follow in clinic per recommendations from Dr. Ardis Hughs after time of procedure/labs.  Ellouise Newer, PA-C [facility] 06/21/2021, 8:58 AM  Cc: Charlott Rakes, MD

## 2021-06-21 NOTE — Patient Instructions (Signed)
Your provider has requested that you go to the basement level for lab work before leaving today. Press "B" on the elevator. The lab is located at the first door on the left as you exit the elevator.  You have been scheduled for an abdominal ultrasound at [facility] (1st floor of hospital) on Wednesday 07/04/21 at 9 am. Please arrive 15 minutes prior to your appointment for registration. Make certain not to have anything to eat or drink 6 hours prior to your appointment. Should you need to reschedule your appointment, please contact radiology at [phone]. This test typically takes about 30 minutes to perform.  We have sent the following medications to your pharmacy for you to pick up at your convenience: Lactulose 30 ml daily.   You have been scheduled for an endoscopy. Please follow written instructions given to you at your visit today. If you use inhalers (even only as needed), please bring them with you on the day of your procedure.  If you are age 57 or older, your body mass index should be between 23-30. Your Body mass index is 22.89 kg/m. If this is out of the aforementioned range listed, please consider follow up with your Primary Care Provider.  If you are age 68 or younger, your body mass index should be between 19-25. Your Body mass index is 22.89 kg/m. If this is out of the aformentioned range listed, please consider follow up with your Primary Care Provider.   ________________________________________________________  The [place] providers would like to encourage you to use [facility] to communicate with providers for non-urgent requests or questions.  Due to long hold times on the telephone, sending your provider a message by [facility] may be a faster and more efficient way to get a response.  Please allow 48 business hours for a response.  Please remember that this is for non-urgent requests.  _______________________________________________________

## 2021-06-22 LAB — AFP TUMOR MARKER: AFP-Tumor Marker: 4.3 ng/mL (ref <6.1)

## 2021-06-26 ENCOUNTER — Other Ambulatory Visit: Payer: Self-pay

## 2021-07-04 ENCOUNTER — Telehealth: Payer: Self-pay

## 2021-07-04 ENCOUNTER — Other Ambulatory Visit: Payer: Self-pay

## 2021-07-04 ENCOUNTER — Ambulatory Visit (HOSPITAL_COMMUNITY)
Admission: RE | Admit: 2021-07-04 | Discharge: 2021-07-04 | Disposition: A | Discharge status: Home / Self Care | Payer: Self-pay | Source: Ambulatory Visit | Attending: Physician Assistant | Admitting: Physician Assistant

## 2021-07-04 DIAGNOSIS — K7030 Alcoholic cirrhosis of liver without ascites: Secondary | ICD-10-CM | POA: Insufficient documentation

## 2021-07-04 DIAGNOSIS — Q453 Other congenital malformations of pancreas and pancreatic duct: Secondary | ICD-10-CM | POA: Insufficient documentation

## 2021-07-04 NOTE — Telephone Encounter (Signed)
Received a call from [place] at [facility] radiology to provide call report for [place] that was completed today.  Dr. Lorenso Courier as DOD today please see results. Dr. Ardis Hughs and Anderson Malta are out of the office this week. Thanks  IMPRESSION: Suboptimal evaluation, within these constraints;   1. Contracted gallbladder with thickened wall. If clinical concern for acalculous cholecystitis, consider NM HIDA for further evaluation. 2. Echogenic liver. Findings most commonly seen in hepatic steatosis, though may also represent hepatitis and/or fibrosis. 3. 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 Findings best appreciated and described on recent multiphasic CT from 11/2020. 4. 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   These results will be called to the ordering clinician or representative by the Radiologist Assistant, and communication documented in the PACS or [employer].

## 2021-07-09 ENCOUNTER — Other Ambulatory Visit: Payer: Self-pay

## 2021-07-24 ENCOUNTER — Encounter (HOSPITAL_COMMUNITY): Payer: Self-pay | Admitting: Gastroenterology

## 2021-07-24 NOTE — Progress Notes (Signed)
Attempted to obtain medical history via telephone, unable to reach at this time. I left a voicemail to return pre surgical testing department's phone call.  

## 2021-08-01 ENCOUNTER — Telehealth: Payer: Self-pay | Admitting: Physician Assistant

## 2021-08-01 NOTE — Telephone Encounter (Signed)
Called [facility] endo unit and spoke with Legrand Como. He states that someone did reach out to patient but he will call the patient today to review his medications. I called patient and made him aware. I told him that he needs to be near his phone and to expect a call soon. Pt verbalized understanding and had no concerns at the end of the call.

## 2021-08-01 NOTE — Progress Notes (Signed)
Received call from [facility] this morning regarding the patient's questions about his medications for the morning. Returned pt's call and discussed medications for tomorrow's procedure. Advised pt not to take glipizide or metformin tomorrow morning per endo protocol regarding NPO status. Advised him that he may take all other scheduled medications in the AM prior to procedure. Pt verbalized understanding and had no further concerns at the end of the call.  Debarah Crape, RN 08/01/21 1:49 PM

## 2021-08-01 NOTE — Telephone Encounter (Signed)
Inbound call from patient have questions about whether he should take his blood pressure medications before procedure with Ardis Hughs 1/26 at [facility]

## 2021-08-01 NOTE — Telephone Encounter (Signed)
Debarah Crape, RN  Levin Erp, [place]; Yevette Edwards, RN Good afternoon all,   I spoke with Mr Tripodi and was able to answer his questions regarding his procedure tomorrow. Advised pt not to take glipizide or metformin since he will not be eating between midnight and his procedure tomorrow. Pt verbalized understanding and had no further questions at the end of the call. Thank you for bringing it to my attention.   Thanks,  Dulcy Fanny, RN  [facility]

## 2021-08-02 ENCOUNTER — Ambulatory Visit (HOSPITAL_COMMUNITY): Payer: Self-pay | Admitting: Anesthesiology

## 2021-08-02 ENCOUNTER — Encounter (HOSPITAL_COMMUNITY)
Admission: RE | Disposition: A | Discharge status: Home / Self Care | Payer: Self-pay | Source: Ambulatory Visit | Attending: Gastroenterology

## 2021-08-02 ENCOUNTER — Other Ambulatory Visit: Payer: Self-pay

## 2021-08-02 ENCOUNTER — Ambulatory Visit (HOSPITAL_COMMUNITY)
Admission: RE | Admit: 2021-08-02 | Discharge: 2021-08-02 | Disposition: A | Discharge status: Home / Self Care | Payer: Self-pay | Source: Ambulatory Visit | Attending: Gastroenterology | Admitting: Gastroenterology

## 2021-08-02 ENCOUNTER — Encounter (HOSPITAL_COMMUNITY): Payer: Self-pay | Admitting: Gastroenterology

## 2021-08-02 DIAGNOSIS — K31A0 Gastric intestinal metaplasia, unspecified: Secondary | ICD-10-CM | POA: Insufficient documentation

## 2021-08-02 DIAGNOSIS — K3189 Other diseases of stomach and duodenum: Secondary | ICD-10-CM | POA: Insufficient documentation

## 2021-08-02 DIAGNOSIS — K219 Gastro-esophageal reflux disease without esophagitis: Secondary | ICD-10-CM

## 2021-08-02 DIAGNOSIS — Q453 Other congenital malformations of pancreas and pancreatic duct: Secondary | ICD-10-CM

## 2021-08-02 DIAGNOSIS — K7030 Alcoholic cirrhosis of liver without ascites: Secondary | ICD-10-CM | POA: Insufficient documentation

## 2021-08-02 DIAGNOSIS — I8510 Secondary esophageal varices without bleeding: Secondary | ICD-10-CM | POA: Insufficient documentation

## 2021-08-02 DIAGNOSIS — Z7984 Long term (current) use of oral hypoglycemic drugs: Secondary | ICD-10-CM | POA: Insufficient documentation

## 2021-08-02 DIAGNOSIS — K862 Cyst of pancreas: Secondary | ICD-10-CM | POA: Insufficient documentation

## 2021-08-02 DIAGNOSIS — D759 Disease of blood and blood-forming organs, unspecified: Secondary | ICD-10-CM | POA: Insufficient documentation

## 2021-08-02 DIAGNOSIS — K766 Portal hypertension: Secondary | ICD-10-CM | POA: Insufficient documentation

## 2021-08-02 DIAGNOSIS — K2970 Gastritis, unspecified, without bleeding: Secondary | ICD-10-CM | POA: Insufficient documentation

## 2021-08-02 DIAGNOSIS — E119 Type 2 diabetes mellitus without complications: Secondary | ICD-10-CM | POA: Insufficient documentation

## 2021-08-02 DIAGNOSIS — D649 Anemia, unspecified: Secondary | ICD-10-CM | POA: Insufficient documentation

## 2021-08-02 DIAGNOSIS — B9681 Helicobacter pylori [H. pylori] as the cause of diseases classified elsewhere: Secondary | ICD-10-CM | POA: Insufficient documentation

## 2021-08-02 HISTORY — PX: FINE NEEDLE ASPIRATION: SHX5430

## 2021-08-02 HISTORY — PX: ESOPHAGOGASTRODUODENOSCOPY (EGD) WITH PROPOFOL: SHX5813

## 2021-08-02 HISTORY — PX: BIOPSY: SHX5522

## 2021-08-02 HISTORY — PX: UPPER ESOPHAGEAL ENDOSCOPIC ULTRASOUND (EUS): SHX6562

## 2021-08-02 LAB — POCT I-STAT, CHEM 8
BUN: 5 mg/dL — ABNORMAL LOW (ref 6–20)
Calcium, Ion: 1.18 mmol/L (ref 1.15–1.40)
Chloride: 104 mmol/L (ref 98–111)
Creatinine, Ser: 0.3 mg/dL — ABNORMAL LOW (ref 0.61–1.24)
Glucose, Bld: 83 mg/dL (ref 70–99)
HCT: 34 % — ABNORMAL LOW (ref 39.0–52.0)
Hemoglobin: 11.6 g/dL — ABNORMAL LOW (ref 13.0–17.0)
Potassium: 3.9 mmol/L (ref 3.5–5.1)
Sodium: 135 mmol/L (ref 135–145)
TCO2: 23 mmol/L (ref 22–32)

## 2021-08-02 LAB — PROTIME-INR
INR: 1.6 — ABNORMAL HIGH (ref 0.8–1.2)
Prothrombin Time: 19.2 seconds — ABNORMAL HIGH (ref 11.4–15.2)

## 2021-08-02 LAB — GLUCOSE, CAPILLARY: Glucose-Capillary: 102 mg/dL — ABNORMAL HIGH (ref 70–99)

## 2021-08-02 SURGERY — UPPER ESOPHAGEAL ENDOSCOPIC ULTRASOUND (EUS)
Anesthesia: Monitor Anesthesia Care

## 2021-08-02 MED ORDER — SODIUM CHLORIDE 0.9 % IV SOLN: INTRAVENOUS | Status: DC

## 2021-08-02 MED ORDER — PROPOFOL 500 MG/50ML IV EMUL
INTRAVENOUS | Status: DC | PRN
  Administered 2021-08-02: 125 ug/kg/min via INTRAVENOUS

## 2021-08-02 MED ORDER — LACTATED RINGERS IV SOLN: INTRAVENOUS | Status: DC | PRN

## 2021-08-02 MED ORDER — LIDOCAINE 2% (20 MG/ML) 5 ML SYRINGE
INTRAMUSCULAR | Status: DC | PRN
  Administered 2021-08-02: 40 mg via INTRAVENOUS

## 2021-08-02 MED ORDER — CIPROFLOXACIN HCL 500 MG PO TABS
500.0000 mg | ORAL_TABLET | Freq: Two times a day (BID) | ORAL | 0 refills | Status: DC
  Filled 2021-08-02: qty 6, 3d supply, fill #0

## 2021-08-02 MED ORDER — CIPROFLOXACIN IN D5W 400 MG/200ML IV SOLN
INTRAVENOUS | Status: DC | PRN
  Administered 2021-08-02: 400 mg via INTRAVENOUS

## 2021-08-02 MED ORDER — CIPROFLOXACIN HCL 500 MG PO TABS: 500.0000 mg | ORAL_TABLET | Freq: Two times a day (BID) | ORAL | 0 refills | Status: DC

## 2021-08-02 MED ORDER — PROPOFOL 10 MG/ML IV BOLUS
INTRAVENOUS | Status: DC | PRN
  Administered 2021-08-02 (×2): 50 mg via INTRAVENOUS

## 2021-08-02 MED ORDER — CIPROFLOXACIN IN D5W 400 MG/200ML IV SOLN
INTRAVENOUS | Status: AC
  Filled 2021-08-02: qty 200

## 2021-08-02 NOTE — Transfer of Care (Signed)
Immediate Anesthesia Transfer of Care Note  Patient: Gary Mitchell  Procedure(s) Performed: UPPER ESOPHAGEAL ENDOSCOPIC ULTRASOUND (EUS) ESOPHAGOGASTRODUODENOSCOPY (EGD) WITH PROPOFOL BIOPSY FINE NEEDLE ASPIRATION (FNA) LINEAR  Patient Location: PACU and Endoscopy Unit  Anesthesia Type:MAC  Level of Consciousness: sedated  Airway & Oxygen Therapy: Patient Spontanous Breathing and Patient connected to face mask oxygen  Post-op Assessment: Report given to RN and Post -op Vital signs reviewed and stable  Post vital signs: Reviewed and stable  Last Vitals:  Vitals Value Taken Time  BP 119/85 08/02/21 1211  Temp 36.8 C 08/02/21 1211  Pulse 78 08/02/21 1212  Resp 14 08/02/21 1212  SpO2 100 % 08/02/21 1212  Vitals shown include unvalidated device data.  Last Pain:  Vitals:   08/02/21 1211  TempSrc: Oral  PainSc: 0-No pain         Complications: No notable events documented.

## 2021-08-02 NOTE — Anesthesia Procedure Notes (Signed)
Procedure Name: MAC Date/Time: 08/02/2021 11:12 AM Performed by: Cynda Familia, CRNA Pre-anesthesia Checklist: Emergency Drugs available, Patient identified, Suction available, Patient being monitored and Timeout performed Patient Re-evaluated:Patient Re-evaluated prior to induction Oxygen Delivery Method: Simple face mask Placement Confirmation: positive ETCO2 and breath sounds checked- equal and bilateral Dental Injury: Teeth and Oropharynx as per pre-operative assessment

## 2021-08-02 NOTE — Op Note (Addendum)
[facility] Patient Name: Gary Mitchell Procedure Date: 08/02/2021 MRN: [id number] Attending MD: Milus Banister , MD Date of Birth: [birth year] CSN: [id number] Age: 51 Admit Type: Outpatient Procedure:                Upper EUS Indications:              Etoh cirrhosis, last drink "two weeks ago", 4-5cm                            cyst in body/tail of pancreas with upstream PD                            dilation Providers:                Milus Banister, MD, Carmie End, RN, Cletis Athens, Technician, Glenis Smoker, CRNA Referring MD:              Medicines:                Monitored Anesthesia Care, cipro 400mg  IV Complications:            No immediate complications. Estimated blood loss:                            None. Estimated Blood Loss:     Estimated blood loss: none. Procedure:                Pre-Anesthesia Assessment:                           - Prior to the procedure, a History and Physical                            was performed, and patient medications and                            allergies were reviewed. The patient's tolerance of                            previous anesthesia was also reviewed. The risks                            and benefits of the procedure and the sedation                            options and risks were discussed with the patient.                            All questions were answered, and informed consent                            was obtained. Prior Anticoagulants: The patient has  taken no previous anticoagulant or antiplatelet                            agents. ASA Grade Assessment: III - A patient with                            severe systemic disease. After reviewing the risks                            and benefits, the patient was deemed in                            satisfactory condition to undergo the procedure.                           After obtaining informed  consent, the endoscope was                            passed under direct vision. Throughout the                            procedure, the patient's blood pressure, pulse, and                            oxygen saturations were monitored continuously. The                            GF-[id number]-AL5 ([id number]) Olympus radial ultrasound                            scope was introduced through the mouth, and                            advanced to the second part of duodenum. The                            [id number] ([id number]) Olympus linear ultrasound scope                            was introduced through the mouth, and advanced to                            the second part of duodenum. The upper EUS was                            accomplished without difficulty. The patient                            tolerated the procedure well. Scope In: Scope Out: Findings:      ENDOSCOPIC FINDING: :      Small distal esophagus varices.      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 pylori.  Normal duodenum. No gastric varices      ENDOSONOGRAPHIC FINDING: :      1. An anechoic cyst was identified in the pancreatic body/tail. The       lesion measured 50 mm in maximal cross-sectional diameter. The cyst       communicated directly with main pancreatic duct in the tail which was       dilated to 15mm. There was a single compartment. The outer wall of the       cyst was thick but there was no associated solid mass and no internal       debris within the fluid-filled cavity. Needle aspiration for fluid was       performed via transgastric approach. Color Doppler imaging was utilized       prior to needle puncture to confirm a lack of significant vascular       structures within the needle path. Initial FNA with a 22 gauge EUS FNA       needle removed the fluid very very slowly and so I made a second       transgastric pass with a 19  guage EUS FNA. In total 70 cc of thin,       yellowish fluid was aspirated and sent for testing. I evaluated the       pancreatic parenchyma following cyst aspiration and there were no       obvious masses and no signs of chronic pancretitis. The main pancreatic       duct was no longer dilated either.      2. No peripancreatic adenopathy.      3. CBD was normal, non-dilated.      4. Limited views of the liver, spleen, portal and splenic vessels were       all normal. 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 The distal stomach                            was biopsied to check for underlying H. pylori.                           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 Sent for cytology, CEA,  amylase.                           - No solid pancreatic masses. Moderate Sedation:      Not Applicable - Patient had care per Anesthesia. Recommendation:           - Discharge patient to home.                           - He will need to complete 3 days of twice daily                            cipro to decrease chance of inection. Procedure Code(s):        --- Professional ---                           [phone], Esophagogastroduodenoscopy, flexible,                            transoral; with transendoscopic ultrasound-guided                            intramural or transmural fine needle                            aspiration/biopsy(s), (includes endoscopic                            ultrasound examination limited to the esophagus,                            stomach or duodenum, and adjacent  structures) Diagnosis Code(s):        --- Professional ---                           K29.70, Gastritis, unspecified, without bleeding                           K86.2, Cyst of pancreas                           D49.0, Neoplasm of unspecified behavior of                            digestive system CPT copyright 2019 American Medical Association. All rights reserved. The codes documented in this report are preliminary and upon coder review may  be revised to meet current compliance requirements. Milus Banister, MD 08/02/2021 12:14:39 PM This report has been signed electronically. Number of Addenda: 0

## 2021-08-02 NOTE — Anesthesia Postprocedure Evaluation (Signed)
Anesthesia Post Note  Patient: ELVA BREAKER  Procedure(s) Performed: UPPER ESOPHAGEAL ENDOSCOPIC ULTRASOUND (EUS) ESOPHAGOGASTRODUODENOSCOPY (EGD) WITH PROPOFOL BIOPSY FINE NEEDLE ASPIRATION (FNA) LINEAR     Patient location during evaluation: Endoscopy Anesthesia Type: MAC Level of consciousness: awake and alert Pain management: pain level controlled Vital Signs Assessment: post-procedure vital signs reviewed and stable Respiratory status: spontaneous breathing, nonlabored ventilation, respiratory function stable and patient connected to nasal cannula oxygen Cardiovascular status: blood pressure returned to baseline and stable Postop Assessment: no apparent nausea or vomiting Anesthetic complications: no   No notable events documented.  Last Vitals:  Vitals:   08/02/21 1220 08/02/21 1230  BP: 124/86 126/81  Pulse: 80 81  Resp: 12 14  Temp:    SpO2: 100% 99%    Last Pain:  Vitals:   08/02/21 1230  TempSrc:   PainSc: 0-No pain                 Devlyn Retter L Tabari Volkert

## 2021-08-02 NOTE — Anesthesia Preprocedure Evaluation (Addendum)
Anesthesia Evaluation  Patient identified by MRN, date of birth, ID band Patient awake    Reviewed: Allergy & Precautions, NPO status , Patient's Chart, lab work & pertinent test results  Airway Mallampati: II  TM Distance: >3 FB Neck ROM: Full    Dental  (+) Chipped, Dental Advisory Given,    Pulmonary neg pulmonary ROS, Current Smoker,    Pulmonary exam normal breath sounds clear to auscultation       Cardiovascular negative cardio ROS Normal cardiovascular exam Rhythm:Regular Rate:Normal     Neuro/Psych Seizures -,  CVA negative psych ROS   GI/Hepatic GERD  Medicated and Controlled,(+) Cirrhosis   Esophageal Varices  substance abuse  alcohol use, Hepatitis -  Endo/Other  diabetes, Type 2, Oral Hypoglycemic Agents  Renal/GU negative Renal ROS  negative genitourinary   Musculoskeletal negative musculoskeletal ROS (+)   Abdominal   Peds  Hematology  (+) Blood dyscrasia, anemia , Lab Results      Component                Value               Date                      WBC                      5.7                 06/21/2021                HGB                      9.3 (L)             06/21/2021                HCT                      29.4 (L)            06/21/2021                MCV                      80.5                06/21/2021                PLT                      107.0 (L)           06/21/2021              Anesthesia Other Findings past medical history of alcoholic cirrhosis and acute alcoholic hepatitis as well as a pancreatic lesion  Reproductive/Obstetrics                            Anesthesia Physical Anesthesia Plan  ASA: 3  Anesthesia Plan: MAC   Post-op Pain Management:    Induction: Intravenous  PONV Risk Score and Plan: Propofol infusion and Treatment may vary due to age or medical condition  Airway Management Planned: Natural Airway  Additional Equipment:    Intra-op Plan:   Post-operative Plan:   Informed Consent: I have reviewed the patients History and Physical, chart, labs and  discussed the procedure including the risks, benefits and alternatives for the proposed anesthesia with the patient or authorized representative who has indicated his/her understanding and acceptance.     Dental advisory given  Plan Discussed with: CRNA  Anesthesia Plan Comments:         Anesthesia Quick Evaluation

## 2021-08-02 NOTE — Discharge Instructions (Signed)
YOU HAD AN ENDOSCOPIC PROCEDURE TODAY: Refer to the procedure report and other information in the discharge instructions given to you for any specific questions about what was found during the examination. If this information does not answer your questions, please call [facility] office at [phone] to clarify.  ° °YOU SHOULD EXPECT: Some feelings of bloating in the abdomen. Passage of more gas than usual. Walking can help get rid of the air that was put into your GI tract during the procedure and reduce the bloating. If you had a lower endoscopy (such as a colonoscopy or flexible sigmoidoscopy) you may notice spotting of blood in your stool or on the toilet paper. Some abdominal soreness may be present for a day or two, also. ° °DIET: Your first meal following the procedure should be a light meal and then it is ok to progress to your normal diet. A half-sandwich or bowl of soup is an example of a good first meal. Heavy or fried foods are harder to digest and may make you feel nauseous or bloated. Drink plenty of fluids but you should avoid alcoholic beverages for 24 hours. If you had a esophageal dilation, please see attached instructions for diet.   ° °ACTIVITY: Your care partner should take you home directly after the procedure. You should plan to take it easy, moving slowly for the rest of the day. You can resume normal activity the day after the procedure however YOU SHOULD NOT DRIVE, use power tools, machinery or perform tasks that involve climbing or major physical exertion for 24 hours (because of the sedation medicines used during the test).  ° °SYMPTOMS TO REPORT IMMEDIATELY: °A gastroenterologist can be reached at any hour. Please call [phone]  for any of the following symptoms:  °Following lower endoscopy (colonoscopy, flexible sigmoidoscopy) °Excessive amounts of blood in the stool  °Significant tenderness, worsening of abdominal pains  °Swelling of the abdomen that is new, acute  °Fever of 100° or  higher  °Following upper endoscopy (EGD, EUS, ERCP, esophageal dilation) °Vomiting of blood or coffee ground material  °New, significant abdominal pain  °New, significant chest pain or pain under the shoulder blades  °Painful or persistently difficult swallowing  °New shortness of breath  °Black, tarry-looking or red, bloody stools ° °FOLLOW UP:  °If any biopsies were taken you will be contacted by phone or by letter within the next 1-3 weeks. Call [phone]  if you have not heard about the biopsies in 3 weeks.  °Please also call with any specific questions about appointments or follow up tests. ° °

## 2021-08-02 NOTE — H&P (Addendum)
HPI: This is a man with etoh cirrhosis, avbnormal pancreas   Review of pertinent gastrointestinal problems: 1.  Alcoholic liver disease; presented with severe alcoholic hepatitis and also alcoholic pancreatitis in the setting of underlying cirrhosis May 202.  MRI May 2021 showed abnormal liver contour, cirrhotic changes of the liver, innumerable small regenerating nodules throughout the liver, evidence of portal hypertension as well with portal venous collaterals. Ultrasound January 2022 cirrhosis no discrete liver lesions.  Ultrasound March 2022 showed no ascites. EGD March 2022 while hospitalized showed small esophageal varices, mild portal gastropathy.  No gastric varices. Presenting total bilirubin 35, AST/ALT ratio greater than 2 1, INR 1.5; discriminant function was high enough that he qualified for oral prednisolone 4-week course Continuing to drink heavily as of March 2022.   2.  Abnormal pancreas.  Presented with acute pancreatitis at the same time as his severe alcoholic hepatitis in May 2021.  Imaging while he was in the hospital also suggested "4 cm ill-defined infiltrating pancreatic body lesion worrisome for infiltrating neoplasm/adenocarcinoma.  This is associated with marked atrophy of the pancreatic tail and dilation of the main pancreatic duct".  Repeat MRI June 2021 was overall much improved.  The "masslike lesion" in the pancreatic body was much less appreciated.  There was a new fluid collection anterior to the pancreatic body look like a pseudocyst.  I recommended follow-up in the office 2 months later, he never scheduled or showed for that appointment.   Tells that his last drink of etoh about 'about 2 weeks ago' which is what he always says about his drinking.  INR was 1.7 lastmonth. We are checking stat INR because he may need FFP to safely perform EUS/FNA  ROS: complete GI ROS as described in HPI, all other review negative.  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   Seizures ([place])    Thrombocytopenia ([place])     Past Surgical History:  Procedure Laterality Date   ENTEROSCOPY N/A 09/20/2020   Procedure: ENTEROSCOPY;  Surgeon: Thornton Park, MD;  Location: [facility];  Service: Gastroenterology;  Laterality: N/A;   left kidney removed      Current Facility-Administered Medications  Medication Dose Route Frequency Provider Last Rate Last Admin   0.9 %  sodium chloride infusion   Intravenous Continuous Levin Erp, PA        Allergies as of 06/21/2021 - Review Complete 06/21/2021  Allergen Reaction Noted   Iohexol Other (See Comments) 05/21/2016    Family History  Problem Relation Age of Onset   Diabetes Mellitus II Mother    Colon cancer Neg Hx    Esophageal cancer Neg Hx     Social History   Socioeconomic History   Marital status: Single    Spouse name: Not on file   Number of children: Not on file   Years of education: Not on file   Highest education level: Not on file  Occupational History   Not on file  Tobacco Use   Smoking status: Every Day  Packs/day: 2.00    Years: 25.00    Pack years: 50.00    Types: Cigarettes   Smokeless tobacco: Never  Vaping Use   Vaping Use: Never used  Substance and Sexual Activity   Alcohol use: Not Currently    Alcohol/week: 3.0 standard drinks    Types: 3 Cans of beer per week   Drug use: Not Currently    Types: Marijuana   Sexual activity: Not on file  Other Topics Concern   Not on file  Social History Narrative   Not  on file   Social Determinants of Health   Financial Resource Strain: Not on file  Food Insecurity: Not on file  Transportation Needs: Not on file  Physical Activity: Not on file  Stress: Not on file  Social Connections: Not on file  Intimate Partner Violence: Not on file     Physical Exam:  Constitutional: generally well-appearing Psychiatric: alert and oriented x3 Abdomen: soft, nontender, nondistended, no obvious ascites, no peritoneal signs, normal bowel sounds No peripheral edema noted in lower extremities  Assessment and plan: 51 y.o. male with abnormal pancreas, etoh cirrhosis  For EUS evaluation today  Please see the "Patient Instructions" section for addition details about the plan.  Owens Loffler, MD [facility] 08/02/2021, 9:23 AM

## 2021-08-03 ENCOUNTER — Encounter (HOSPITAL_COMMUNITY): Payer: Self-pay | Admitting: Gastroenterology

## 2021-08-03 LAB — CYTOLOGY - NON PAP

## 2021-08-06 LAB — SURGICAL PATHOLOGY

## 2021-08-22 ENCOUNTER — Other Ambulatory Visit: Payer: Self-pay

## 2021-08-22 ENCOUNTER — Encounter: Payer: Self-pay | Admitting: Family Medicine

## 2021-08-22 ENCOUNTER — Ambulatory Visit: Payer: Self-pay | Attending: Family Medicine | Admitting: Family Medicine

## 2021-08-22 VITALS — BP 146/90 | HR 89 | Ht 64.0 in | Wt 142.2 lb

## 2021-08-22 DIAGNOSIS — K709 Alcoholic liver disease, unspecified: Secondary | ICD-10-CM

## 2021-08-22 DIAGNOSIS — E1142 Type 2 diabetes mellitus with diabetic polyneuropathy: Secondary | ICD-10-CM

## 2021-08-22 DIAGNOSIS — Z1211 Encounter for screening for malignant neoplasm of colon: Secondary | ICD-10-CM

## 2021-08-22 DIAGNOSIS — R030 Elevated blood-pressure reading, without diagnosis of hypertension: Secondary | ICD-10-CM

## 2021-08-22 DIAGNOSIS — E11649 Type 2 diabetes mellitus with hypoglycemia without coma: Secondary | ICD-10-CM

## 2021-08-22 LAB — POCT GLYCOSYLATED HEMOGLOBIN (HGB A1C): HbA1c, POC (controlled diabetic range): 6 % (ref 0.0–7.0)

## 2021-08-22 LAB — GLUCOSE, POCT (MANUAL RESULT ENTRY): POC Glucose: 129 mg/dl — AB (ref 70–99)

## 2021-08-22 MED ORDER — GABAPENTIN 300 MG PO CAPS
300.0000 mg | ORAL_CAPSULE | Freq: Every day | ORAL | 3 refills | Status: DC
  Filled 2021-08-22: qty 30, 30d supply, fill #0
  Filled 2021-09-14: qty 30, 30d supply, fill #1

## 2021-08-22 MED ORDER — GLIPIZIDE 5 MG PO TABS
2.5000 mg | ORAL_TABLET | Freq: Two times a day (BID) | ORAL | 3 refills | Status: DC
  Filled 2021-08-22: qty 30, 30d supply, fill #0
  Filled 2021-09-14: qty 30, 30d supply, fill #1

## 2021-08-22 NOTE — Progress Notes (Signed)
Subjective:  Patient ID: Gary Mitchell, male    DOB: 01/06/1971  Age: 51 y.o. MRN: [id number]  CC: Diabetes   HPI Gary Mitchell is a 51 y.o. year old male with a history of alcoholic liver disease, pancreatitis, history of alcoholic hepatitis in 01/8294, alcohol-related seizures, type 2 diabetes mellitus (A1c of 11.1), previously smoked (quit 2 cig/day and quit 2022)who presents today for follow-up. 2 weeks ago he was hospitalized at [facility] with severe alcoholic hepatitis and alcoholic pancreatitis. Abdominal ultrasound revealed: IMPRESSION: Suboptimal evaluation, within these constraints;   1. Contracted gallbladder with thickened wall. If clinical concern for acalculous cholecystitis, consider NM HIDA for further evaluation. 2. Echogenic liver. Findings most commonly seen in hepatic steatosis, though may also represent hepatitis and/or fibrosis. 3. 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 Findings best appreciated and described on recent multiphasic CT from 11/2020. 4. 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 The distal stomach was biopsied to check for underlying H. pylori. - 5cm unilocular, anechoic cyst in the body/tail of the pancreas with clear main pancreatid duct communication and upstream dilation (to 48m) which resolved completely after the cyst was aspirated of 70cc thin, yellowish fluid. Sent for cytology, CEA, amylase. - No solid pancreatic masses.  His pancreatic cyst was drained and he was discharged on ciprofloxacin.  Interval History: He states he has not drank alcohol in a month.  Yet to see GI for follow-up.  He has completed his course of ciprofloxacin.  Denies presence of hematochezia, hematemesis, bruising or bleeding. His feet stay numb and this has been going on for  the last couple weeks His A1c is 6.0 down from 11.1 previously and random sugars are 190 and sometimes  fasting are around 87. He has had some hypoglycemic episodes evidenced by sweatign in his sleep He has not had an Ophthalmology exam due to lack of medical coverage.  He has gained 9 lbs and has noticed an increase in appetite. 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   Seizures ([place]    Thrombocytopenia ([place]     Past Surgical History:  Procedure Laterality Date   BIOPSY  08/02/2021   Procedure: BIOPSY;  Surgeon: JMilus Banister MD;  [facility]: [facility];  Service: Endoscopy;;   ENTEROSCOPY N/A 09/20/2020   Procedure: ENTEROSCOPY;  Surgeon: BThornton Park MD;  [place]  Service: Gastroenterology;  Laterality: N/A;   ESOPHAGOGASTRODUODENOSCOPY (EGD) WITH PROPOFOL N/A 08/02/2021   Procedure: ESOPHAGOGASTRODUODENOSCOPY (EGD) WITH PROPOFOL;  Surgeon: JMilus Banister MD;  [facility]: [facility];  Service: Endoscopy;  Laterality: N/A;   FINE NEEDLE ASPIRATION N/A 08/02/2021   Procedure: FINE NEEDLE ASPIRATION (FNA) LINEAR;  Surgeon: JMilus Banister MD;  Location: [facility];  Service: Endoscopy;  Laterality: N/A;   left kidney removed     UPPER ESOPHAGEAL ENDOSCOPIC ULTRASOUND (EUS) N/A 08/02/2021   Procedure: UPPER ESOPHAGEAL ENDOSCOPIC ULTRASOUND (EUS);  Surgeon: JMilus Banister MD;  [facility];  Service: Endoscopy;  Laterality: N/A;  Radial and Linear    Family History  Problem Relation Age of Onset   Diabetes Mellitus II Mother    Colon cancer Neg  Hx    Esophageal cancer Neg Hx     Allergies  Allergen Reactions   Iohexol Other (See Comments)    Unknown reaction at 10 days old Mom at bedside reported patient was given injections of iohexol in foot and resulted in "hole in foot"    Outpatient Medications Prior to Visit  Medication Sig Dispense Refill   aspirin EC 81 MG tablet Take 324 mg by mouth daily. Swallow whole.     atorvastatin (LIPITOR) 20 MG tablet Take 1 tablet (20 mg total) by mouth daily. 30 tablet 3   ciprofloxacin (CIPRO) 500 MG tablet Take 1 tablet (500 mg total) by mouth 2 (two) times daily. 6 tablet 0   ciprofloxacin (CIPRO) 500 MG tablet Take 1 tablet (500 mg total) by mouth 2 (two) times daily. 6 tablet 0   lactulose, encephalopathy, (CHRONULAC) 10 GM/15ML SOLN Take 30 mLs (20 g total) by mouth daily. 900 mL 3   levETIRAcetam (KEPPRA) 500 MG tablet Take 1 tablet (500 mg total) by mouth 2 (two) times daily. 60 tablet 3   metFORMIN (GLUCOPHAGE) 500 MG tablet Take 2 tablets (1,000 mg total) by mouth 2 (two) times daily with a meal. 120 tablet 3   Multiple Vitamin (MULTIVITAMIN) tablet Take 1 tablet by mouth at bedtime.     Multiple Vitamins-Minerals (LIVER DETOX) TABS Take 1 tablet by mouth every evening. Liver Cleanse     naltrexone (DEPADE) 50 MG tablet Take 1 tablet (50 mg total) by mouth daily. 30 tablet 3   pantoprazole (PROTONIX) 40 MG tablet Take 1 tablet (40 mg total) by mouth daily. (Patient taking differently: Take 40 mg by mouth at bedtime.) 30 tablet 3   potassium chloride SA (KLOR-CON) 20 MEQ tablet Take 1 tablet (20 mEq total) by mouth daily. 3 tablet 0   glipiZIDE (GLUCOTROL) 5 MG tablet Take 1 tablet (5 mg total) by mouth 2 (two) times daily before a meal. 60 tablet 3   No facility-administered medications prior to visit.     ROS Review of Systems  Constitutional:  Negative for activity change and appetite change.  HENT:  Negative for sinus pressure and sore throat.   Eyes:  Negative for visual  disturbance.  Respiratory:  Negative for cough, chest tightness and shortness of breath.   Cardiovascular:  Negative for chest pain and leg swelling.  Gastrointestinal:  Negative for abdominal distention, abdominal pain, constipation and diarrhea.  Endocrine: Negative.   Genitourinary:  Negative for dysuria.  Musculoskeletal:  Negative for joint swelling and myalgias.  Skin:  Negative for rash.  Allergic/Immunologic: Negative.   Neurological:  Positive for numbness. Negative for weakness and light-headedness.  Psychiatric/Behavioral:  Negative for dysphoric mood and suicidal ideas.    Objective:  BP (!) 153/91    Pulse 89    Ht '5\' 4"'  (1.626 m)    Wt 142 lb 3.2 oz (64.5 kg)    BMI 24.41 kg/m   BP/Weight 08/22/2021 08/02/2021 53/61/4431  Systolic BP 540 086 761  Diastolic BP 91 81 70  Wt. (Lbs) 142.2 133.38 133.38  BMI 24.41 22.89 22.89      Physical Exam Constitutional:      Appearance: He is well-developed.  Eyes:     General: Scleral icterus present.  Cardiovascular:     Rate and  Rhythm: Normal rate.     Heart sounds: Normal heart sounds. No murmur heard. Pulmonary:     Effort: Pulmonary effort is normal.     Breath sounds: Normal breath sounds. No wheezing or rales.  Chest:     Chest wall: No tenderness.  Abdominal:     General: Bowel sounds are normal. There is no distension.     Palpations: Abdomen is soft. There is no mass.     Tenderness: There is no abdominal tenderness.  Musculoskeletal:        General: Normal range of motion.     Right lower leg: No edema.     Left lower leg: No edema.  Neurological:     Mental Status: He is alert and oriented to person, place, and time.  Psychiatric:        Mood and Affect: Mood normal.   Diabetic Foot Exam - Simple   Simple Foot Form Visual Inspection No deformities, no ulcerations, no other skin breakdown bilaterally: Yes Sensation Testing Intact to touch and monofilament testing bilaterally: Yes Pulse  Check Posterior Tibialis and Dorsalis pulse intact bilaterally: Yes Comments     CMP Latest Ref Rng & Units 08/02/2021 06/21/2021 05/29/2021  Glucose 70 - 99 mg/dL 83 467(H) 499(H)  BUN 6 - 20 mg/dL 5(L) 6 9  Creatinine 0.61 - 1.24 mg/dL 0.30(L) 0.61 0.55(L)  Sodium 135 - 145 mmol/L 135 130(L) 129(L)  Potassium 3.5 - 5.1 mmol/L 3.9 3.4(L) 4.3  Chloride 98 - 111 mmol/L 104 99 92(L)  CO2 19 - 32 mEq/L - 20 21  Calcium 8.4 - 10.5 mg/dL - 9.2 8.8  Total Protein 6.0 - 8.3 g/dL - 7.7 -  Total Bilirubin 0.2 - 1.2 mg/dL - 3.5(H) -  Alkaline Phos 39 - 117 U/L - 171(H) -  AST 0 - 37 U/L - 35 -  ALT 0 - 53 U/L - 18 -    Lipid Panel     Component Value Date/Time   TRIG 120 07/09/2020 1240    CBC    Component Value Date/Time   WBC 5.7 06/21/2021 0939   RBC 3.65 (L) 06/21/2021 0939   HGB 11.6 (L) 08/02/2021 1003   HGB 10.6 (L) 10/03/2020 1127   HCT 34.0 (L) 08/02/2021 1003   HCT 30.0 (L) 10/03/2020 1127   PLT 107.0 (L) 06/21/2021 0939   PLT 301 10/03/2020 1127   MCV 80.5 06/21/2021 0939   MCV 74 (L) 10/03/2020 1127   MCH 26.0 05/06/2021 1930   MCHC 31.8 06/21/2021 0939   RDW 25.6 (H) 06/21/2021 0939   RDW 21.1 (H) 10/03/2020 1127   LYMPHSABS 1.5 06/21/2021 0939   LYMPHSABS 2.2 10/03/2020 1127   MONOABS 0.9 06/21/2021 0939   EOSABS 0.1 06/21/2021 0939   EOSABS 0.1 10/03/2020 1127   BASOSABS 0.0 06/21/2021 0939   BASOSABS 0.2 10/03/2020 1127    Lab Results  Component Value Date   HGBA1C 6.0 08/22/2021    Assessment & Plan:  1. Type 2 diabetes mellitus with hypoglycemia without coma, without long-term current use of insulin (HCC) Significantly improved with A1c of 6.0 down from 11.1 He has been commended on improvement Due to his hypoglycemic symptoms glipizide dose has been decreased and he has been advised to contact the clinic if this continues  Counseled on Diabetic diet, my plate method, 010 minutes of moderate intensity exercise/week Blood sugar logs with fasting  goals of 80-120 mg/dl, random of less than 180 and in the event of sugars  less than 60 mg/dl or greater than 400 mg/dl encouraged to notify the clinic. Advised on the need for annual eye exams, annual foot exams, Pneumonia vaccine. Unable to refer to ophthalmologist due to lack of medical coverage but he will need to seek community resources like [facility] for eye exam. Counseled on Diabetic diet, my plate method, 183 minutes of moderate intensity exercise/week Blood sugar logs with fasting goals of 80-120 mg/dl, random of less than 180 and in the event of sugars less than 60 mg/dl or greater than 400 mg/dl encouraged to notify the clinic. Advised on the need for annual eye exams, annual foot exams, Pneumonia vaccine. - POCT glucose (manual entry) - POCT glycosylated hemoglobin (Hb A1C) - Microalbumin / creatinine urine ratio - glipiZIDE (GLUCOTROL) 5 MG tablet; Take 0.5 tablets (2.5 mg total) by mouth 2 (two) times daily before a meal.  Dispense: 30 tablet; Refill: 3  2. Alcoholic liver disease ([place]) He quit alcohol a month ago - Ambulatory referral to Gastroenterology - CMP14+EGFR - Protime-INR - [place]  3. Screening for colon cancer - Fecal occult blood, imunochemical  4. Diabetic polyneuropathy associated with type 2 diabetes mellitus ([place]) Could be diabetic neuropathy coupled with alcoholic peripheral neuropathy Discussed sedating adverse effects of gabapentin and after shared decision making he is willing to try it. - gabapentin (NEURONTIN) 300 MG capsule; Take 1 capsule (300 mg total) by mouth at bedtime.  Dispense: 30 capsule; Refill: 3  5.  Elevated blood pressure Blood pressures have been previously normal and he has no diagnosis of hypertension Encouraged to work on low-sodium foods and we will recheck at next visit Counseled on blood pressure goal of less than 130/80, low-sodium, DASH diet, medication compliance, 150 minutes of moderate intensity exercise per  week. Discussed medication compliance, adverse effects.   Meds ordered this encounter  Medications   glipiZIDE (GLUCOTROL) 5 MG tablet    Sig: Take 0.5 tablets (2.5 mg total) by mouth 2 (two) times daily before a meal.    Dispense:  30 tablet    Refill:  3    Dose decrease   gabapentin (NEURONTIN) 300 MG capsule    Sig: Take 1 capsule (300 mg total) by mouth at bedtime.    Dispense:  30 capsule    Refill:  3    Follow-up: Return in about 3 months (around 11/19/2021) for Chronic medical conditions.       Charlott Rakes, MD, FAAFP. [facility] and Opal Zihlman, [place]   08/22/2021, 2:52 PM

## 2021-08-22 NOTE — Patient Instructions (Signed)
Diabetic Neuropathy Diabetic neuropathy refers to nerve damage that is caused by diabetes. Over time, people with diabetes can develop nerve damage throughout the body. There are several types of diabetic neuropathy: Peripheral neuropathy. This is the most common type of diabetic neuropathy. It damages the nerves that carry signals between the spinal cord and other parts of the body (peripheral nerves). This usually affects nerves in the feet, legs, hands, and arms. Autonomic neuropathy. This type causes damage to nerves that control involuntary functions (autonomic nerves). Involuntary functions are functions of the body that you do not control. They include heartbeat, body temperature, blood pressure, urination, digestion, sweating, sexual function, or response to changes in blood glucose. Focal neuropathy. This type of nerve damage affects one area of the body, such as an arm, a leg, or the face. The injury may involve one nerve or a small group of nerves. Focal neuropathy can be painful and unpredictable. It occurs most often in older adults with diabetes. This often develops suddenly, but usually improves over time and does not cause long-term problems. Proximal neuropathy. This type of nerve damage affects the nerves of the thighs, hips, buttocks, or legs. It causes severe pain, weakness, and muscle death (atrophy), usually in the thigh muscles. It is more common among older men and people who have type 2 diabetes. The length of recovery time may vary. What are the causes? Peripheral, autonomic, and focal neuropathies are caused by diabetes that is not well controlled with treatment. The cause of proximal neuropathy is not known, but it may be caused by inflammation related to uncontrolled blood glucose levels. What are the signs or symptoms? Peripheral neuropathy Peripheral neuropathy develops slowly over time. When the nerves of the feet and legs no longer work, you may experience: Burning,  stabbing, or aching pain in the legs or feet. Pain or cramping in the legs or feet. Loss of feeling (numbness) and inability to feel pressure or pain in the feet. This can lead to: Thick calluses or sores on areas of constant pressure. Ulcers. Reduced ability to feel temperature changes. Foot deformities. Muscle weakness. Loss of balance or coordination. Autonomic neuropathy The symptoms of autonomic neuropathy vary depending on which nerves are affected. Symptoms may include: Problems with digestion, such as: Nausea or vomiting. Poor appetite. Bloating. Diarrhea or constipation. Trouble swallowing. Losing weight without trying to. Problems with the heart, blood, and lungs, such as: Dizziness, especially when standing up. Fainting. Shortness of breath. Irregular heartbeat. Bladder problems, such as: Trouble starting or stopping urination. Leaking urine. Trouble emptying the bladder. Urinary tract infections (UTIs). Problems with other body functions, such as: Sweat. You may sweat too much or too little. Temperature. You might get hot easily. Or, you might feel cold more than usual. Sexual function. Men may not be able to get or maintain an erection. Women may have vaginal dryness and difficulty with arousal. Focal neuropathy Symptoms affect only one area of the body. Common symptoms include: Numbness. Tingling. Burning pain. Prickling feeling. Very sensitive skin. Weakness. Inability to move (paralysis). Muscle twitching. Muscles getting smaller (wasting). Poor coordination. Double or blurred vision. Proximal neuropathy Sudden, severe pain in the hip, thigh, or buttocks. Pain may spread from the back into the legs (sciatica). Pain and numbness in the arms and legs. Tingling. Loss of bladder control or bowel control. Weakness and wasting of thigh muscles. Difficulty getting up from a seated position. Abdominal swelling. Unexplained weight loss. How is this  diagnosed? Diagnosis varies depending on the type   of neuropathy your health care provider suspects. Peripheral neuropathy Your health care provider will do a neurologic exam. This exam checks your reflexes, how you move, and what you can feel. You may have other tests, such as: Blood tests. Tests of the fluid that surrounds the spinal cord (lumbar puncture). CT scan. MRI. Checking the nerves that control muscles (electromyogram, or EMG). Checking how quickly signals pass through your nerves (nerve conduction study). Checking a small piece of a nerve using a microscope (biopsy). Autonomic neuropathy You may have tests, such as: Tests to measure your blood pressure and heart rate. You may be secured to an exam table that moves you from a lying position to an upright position (table tilt test). Breathing tests to check your lungs. Tests to check how food moves through the digestive system (gastric emptying tests). Blood, sweat, or urine tests. Ultrasound of your bladder. Spinal fluid tests. Focal neuropathy This condition may be diagnosed with: A neurologic exam. CT scan. MRI. EMG. Nerve conduction study. Proximal neuropathy There is no test to diagnose this type of neuropathy. You may have tests to rule out other possible causes of this type of neuropathy. Tests may include: X-rays of your spine and lumbar region. Lumbar puncture. MRI. How is this treated? The goal of treatment is to keep nerve damage from getting worse. Treatment may include: Following your diabetes management plan. This will help keep your blood glucose level and your A1C level within your target range. This is the most important treatment. Using prescription pain medicine. Follow these instructions at home: Diabetes management Follow your diabetes management plan as told by your health care provider. Check your blood glucose levels. Keep your blood glucose in your target range. Have your A1C level checked at  least two times a year, or as often as told. Take over the counter and prescription medicines only as told by your health care provider. This includes insulin and diabetes medicine.  Lifestyle  Do not use any products that contain nicotine or tobacco, such as cigarettes, e-cigarettes, and chewing tobacco. If you need help quitting, ask your health care provider. Be physically active every day. Include strength training and balance exercises. Follow a healthy meal plan. Work with your health care provider to manage your blood pressure. General instructions Ask your health care provider if the medicine prescribed to you requires you to avoid driving or using machinery. Check your skin and feet every day for cuts, bruises, redness, blisters, or sores. Keep all follow-up visits. This is important. Contact a health care provider if: You have burning, stabbing, or aching pain in your legs or feet. You are unable to feel pressure or pain in your feet. You develop problems with digestion, such as: Nausea. Vomiting. Bloating. Constipation. Diarrhea. Abdominal pain. You have difficulty with urination, such as: Inability to control when you urinate (incontinence). Inability to completely empty the bladder (retention). You feel as if your heart is racing (palpitations). You feel dizzy, weak, or faint when you stand up. Get help right away if: You cannot urinate. You have sudden weakness or loss of coordination. You have trouble speaking. You have pain or pressure in your chest. You have an irregular heartbeat. You have sudden inability to move a part of your body. These symptoms may represent a serious problem that is an emergency. Do not wait to see if the symptoms will go away. Get medical help right away. Call your local emergency services (911 in the [place] Do not drive yourself to   the hospital. Summary Diabetic neuropathy is nerve damage that is caused by diabetes. It can cause numbness  and pain in the arms, legs, digestive tract, heart, and other body systems. This condition is treated by keeping your blood glucose level and your A1C level within your target range. This can help prevent neuropathy from getting worse. Check your skin and feet every day for cuts, bruises, redness, blisters, or sores. Do not use any products that contain nicotine or tobacco, such as cigarettes, e-cigarettes, and chewing tobacco. This information is not intended to replace advice given to you by your health care provider. Make sure you discuss any questions you have with your health care provider. Document Revised: 11/04/2019 Document Reviewed: 11/04/2019 Elsevier Patient Education  2022 [employer]vier Inc.  

## 2021-08-23 LAB — PROTIME-INR
INR: 1.5 — ABNORMAL HIGH (ref 0.9–1.2)
Prothrombin Time: 15.3 s — ABNORMAL HIGH (ref 9.1–12.0)

## 2021-08-23 LAB — CMP14+EGFR
ALT: 15 IU/L (ref 0–44)
AST: 71 IU/L — ABNORMAL HIGH (ref 0–40)
Albumin/Globulin Ratio: 0.8 — ABNORMAL LOW (ref 1.2–2.2)
Albumin: 3.9 g/dL — ABNORMAL LOW (ref 4.0–5.0)
Alkaline Phosphatase: 239 IU/L — ABNORMAL HIGH (ref 44–121)
BUN/Creatinine Ratio: 16 (ref 9–20)
BUN: 7 mg/dL (ref 6–24)
Bilirubin Total: 6.4 mg/dL — ABNORMAL HIGH (ref 0.0–1.2)
CO2: 19 mmol/L — ABNORMAL LOW (ref 20–29)
Calcium: 8.8 mg/dL (ref 8.7–10.2)
Chloride: 97 mmol/L (ref 96–106)
Creatinine, Ser: 0.45 mg/dL — ABNORMAL LOW (ref 0.76–1.27)
Globulin, Total: 4.6 g/dL — ABNORMAL HIGH (ref 1.5–4.5)
Glucose: 113 mg/dL — ABNORMAL HIGH (ref 70–99)
Potassium: 4.1 mmol/L (ref 3.5–5.2)
Sodium: 134 mmol/L (ref 134–144)
Total Protein: 8.5 g/dL (ref 6.0–8.5)
eGFR: 130 mL/min/{1.73_m2} (ref >59)

## 2021-08-23 LAB — AMMONIA: Ammonia: 264 ug/dL (ref 40–200)

## 2021-08-23 LAB — MICROALBUMIN / CREATININE URINE RATIO
Creatinine, Urine: 179.8 mg/dL
Microalb/Creat Ratio: 13 mg/g creat (ref 0–29)
Microalbumin, Urine: 24 ug/mL

## 2021-08-24 ENCOUNTER — Telehealth: Payer: Self-pay

## 2021-08-24 ENCOUNTER — Other Ambulatory Visit: Payer: Self-pay | Admitting: Family Medicine

## 2021-08-24 ENCOUNTER — Other Ambulatory Visit: Payer: Self-pay

## 2021-08-24 MED ORDER — LACTULOSE 10 G PO PACK
10.0000 g | PACK | Freq: Three times a day (TID) | ORAL | 3 refills | Status: DC
  Filled 2021-08-24: qty 90, fill #0

## 2021-08-24 NOTE — Telephone Encounter (Signed)
Patient name and DOB has been verified Patient was informed of lab results. Patient had no questions.   Pt states that he will reach out to his GI doctor today.

## 2021-08-24 NOTE — Telephone Encounter (Signed)
-----   Message from Charlott Rakes, MD sent at 08/24/2021  5:06 AM EST ----- Ammonia is severely elevated and I have sent a rx for Lactulose to his Pharmacy and his liver enzymes are also elevated. He needs to follow up with GI ASAP.

## 2021-09-14 ENCOUNTER — Other Ambulatory Visit: Payer: Self-pay | Admitting: Family Medicine

## 2021-09-14 ENCOUNTER — Other Ambulatory Visit: Payer: Self-pay

## 2021-09-14 NOTE — Telephone Encounter (Signed)
Requested medication (s) are due for refill today:   No ? ?Requested medication (s) are on the active medication list:   Yes from an ED visit ? ?Future visit scheduled:    ? ? ?Last ordered: 4 months ago from an ED visit. ? ?Returned for provider review for refill.   Also protocol criteria not met.  ? ?Requested Prescriptions  ?Pending Prescriptions Disp Refills  ? potassium chloride SA (KLOR-CON M) 20 MEQ tablet 3 tablet 0  ?  Sig: Take 1 tablet (20 mEq total) by mouth daily.  ?  ? Endocrinology:  Minerals - Potassium Supplementation Failed - 09/14/2021  2:02 PM  ?  ?  Failed - Cr in normal range and within 360 days  ?  Creatinine, Ser  ?Date Value Ref Range Status  ?08/22/2021 0.45 (L) 0.76 - 1.27 mg/dL Final  ?  ?  ?  ?  Passed - K in normal range and within 360 days  ?  Potassium  ?Date Value Ref Range Status  ?08/22/2021 4.1 3.5 - 5.2 mmol/L Final  ?  ?  ?  ?  Passed - Valid encounter within last 12 months  ?  Recent Outpatient Visits   ? ?      ? 3 weeks ago Type 2 diabetes mellitus with hypoglycemia without coma, without long-term current use of insulin ([place])  ? [place], Charlane Ferretti, MD  ? 3 months ago Screening for colon cancer  ? [place], Charlane Ferretti, MD  ? 11 months ago Pancreatic mass  ? [place], Charlane Ferretti, MD  ? 1 year ago Alcoholic liver disease [facility])  ? [place], [place]  ? 1 year ago Pancreatic mass  ? [place], [place], [place]  ? ?  ?  ?Future Appointments   ? ?        ? In 2 months Charlott Rakes, MD [place]  ? ?  ? ?  ?  ?  ? ?

## 2021-09-20 ENCOUNTER — Other Ambulatory Visit: Payer: Self-pay

## 2021-10-09 ENCOUNTER — Other Ambulatory Visit: Payer: Self-pay

## 2021-10-09 ENCOUNTER — Ambulatory Visit (INDEPENDENT_AMBULATORY_CARE_PROVIDER_SITE_OTHER): Payer: Self-pay | Admitting: Gastroenterology

## 2021-10-09 ENCOUNTER — Encounter: Payer: Self-pay | Admitting: Gastroenterology

## 2021-10-09 ENCOUNTER — Other Ambulatory Visit (INDEPENDENT_AMBULATORY_CARE_PROVIDER_SITE_OTHER): Payer: Self-pay

## 2021-10-09 DIAGNOSIS — Q453 Other congenital malformations of pancreas and pancreatic duct: Secondary | ICD-10-CM

## 2021-10-09 DIAGNOSIS — K7030 Alcoholic cirrhosis of liver without ascites: Secondary | ICD-10-CM

## 2021-10-09 LAB — CBC
HCT: 29.1 % — ABNORMAL LOW (ref 39.0–52.0)
Hemoglobin: 9.4 g/dL — ABNORMAL LOW (ref 13.0–17.0)
MCHC: 32.2 g/dL (ref 30.0–36.0)
MCV: 78.1 fl (ref 78.0–100.0)
Platelets: 151 10*3/uL (ref 150.0–400.0)
RBC: 3.72 Mil/uL — ABNORMAL LOW (ref 4.22–5.81)
RDW: 26.1 % — ABNORMAL HIGH (ref 11.5–15.5)
WBC: 6.4 10*3/uL (ref 4.0–10.5)

## 2021-10-09 LAB — COMPREHENSIVE METABOLIC PANEL
ALT: 14 U/L (ref 0–53)
AST: 53 U/L — ABNORMAL HIGH (ref 0–37)
Albumin: 4 g/dL (ref 3.5–5.2)
Alkaline Phosphatase: 157 U/L — ABNORMAL HIGH (ref 39–117)
BUN: 10 mg/dL (ref 6–23)
CO2: 20 mEq/L (ref 19–32)
Calcium: 8.6 mg/dL (ref 8.4–10.5)
Chloride: 103 mEq/L (ref 96–112)
Creatinine, Ser: 0.51 mg/dL (ref 0.40–1.50)
GFR: 119.57 mL/min (ref >60.00)
Glucose, Bld: 169 mg/dL — ABNORMAL HIGH (ref 70–99)
Potassium: 3.6 mEq/L (ref 3.5–5.1)
Sodium: 136 mEq/L (ref 135–145)
Total Bilirubin: 3.6 mg/dL — ABNORMAL HIGH (ref 0.2–1.2)
Total Protein: 8.5 g/dL — ABNORMAL HIGH (ref 6.0–8.3)

## 2021-10-09 LAB — PROTIME-INR
INR: 1.5 ratio — ABNORMAL HIGH (ref 0.8–1.0)
Prothrombin Time: 16.3 s — ABNORMAL HIGH (ref 9.6–13.1)

## 2021-10-09 MED ORDER — METRONIDAZOLE 250 MG PO TABS
250.0000 mg | ORAL_TABLET | Freq: Four times a day (QID) | ORAL | 0 refills | Status: AC
  Filled 2021-10-09: qty 40, 10d supply, fill #0

## 2021-10-09 MED ORDER — DOXYCYCLINE HYCLATE 100 MG PO TABS
100.0000 mg | ORAL_TABLET | Freq: Two times a day (BID) | ORAL | 0 refills | Status: AC
  Filled 2021-10-09: qty 20, 10d supply, fill #0

## 2021-10-09 MED ORDER — BISMUTH SUBSALICYLATE 262 MG PO CHEW
524.0000 mg | CHEWABLE_TABLET | Freq: Four times a day (QID) | ORAL | 0 refills | Status: AC
  Filled 2021-10-09: qty 80, 10d supply, fill #0

## 2021-10-09 NOTE — Patient Instructions (Addendum)
If you are age 49 or younger, your body mass index should be between 19-25. Your Body mass index is 24.05 kg/m?Marland Kitchen If this is out of the aformentioned range listed, please consider follow up with your Primary Care Provider.  ?________________________________________________________ ? ?The [facility] providers would like to encourage you to use [facility] to communicate with providers for non-urgent requests or questions.  Due to long hold times on the telephone, sending your provider a message by [facility] may be a faster and more efficient way to get a response.  Please allow 48 business hours for a response.  Please remember that this is for non-urgent requests.  ?_______________________________________________________ ? ?Your provider has requested that you go to the basement level for lab work before leaving today. Press "B" on the elevator. The lab is located at the first door on the left as you exit the elevator. ? ?Due to recent changes in healthcare laws, you may see the results of your imaging and laboratory studies on MyChart before your provider has had a chance to review them.  We understand that in some cases there may be results that are confusing or concerning to you. Not all laboratory results come back in the same time frame and the provider may be waiting for multiple results in order to interpret others.  Please give [place] 48 hours in order for your provider to thoroughly review all the results before contacting the office for clarification of your results.  ? ?We have sent the following medications to your pharmacy for you to pick up at your convenience: ? ?START: flagyl '250mg'$  one tablet 4 times daily for 10 days ?START: doxyclycline '100mg'$  one tablet 2 times daily for 10 days ?START: pepto-bismol 262 mg 2 tablets 4 times daily for 10days ? ?Please discontinue drinking alcohol. ? ?We will contact you in a few weeks about getting an MRI scheduled.  Please call our office in 3 weeks if you have not heard from  [place]. ? ?Thank you for entrusting me with your care and choosing [facility]. ? ?Dr Ardis Hughs ? ? ?

## 2021-10-09 NOTE — Progress Notes (Signed)
Review of pertinent gastrointestinal problems: ?1.  Alcoholic liver disease; presented with severe alcoholic hepatitis and also alcoholic pancreatitis in the setting of underlying cirrhosis May 202.  ?MRI May 2021 showed abnormal liver contour, cirrhotic changes of the liver, innumerable small regenerating nodules throughout the liver, evidence of portal hypertension as well with portal venous collaterals. ?Ultrasound January 2022 cirrhosis no discrete liver lesions.  Ultrasound March 2022 showed no ascites. ?EGD March 2022 while hospitalized showed small esophageal varices, mild portal gastropathy.  No gastric varices.  EUS 07/2021 showed small esophageal varices, mild portal gastropathy. ?Presenting total bilirubin 35, AST/ALT ratio greater than 2 1, INR 1.5; discriminant function was high enough that he qualified for oral prednisolone 4-week course ?Continuing to drink heavily as of March 2022. ?MELD score 11 (08/2021 labs) ?  ?2.  Abnormal pancreas.  Presented with acute pancreatitis at the same time as his severe alcoholic hepatitis in May 2021.  Imaging while he was in the hospital also suggested "4 cm ill-defined infiltrating pancreatic body lesion worrisome for infiltrating neoplasm/adenocarcinoma.  This is associated with marked atrophy of the pancreatic tail and dilation of the main pancreatic duct".  Repeat MRI June 2021 was overall much improved.  The "masslike lesion" in the pancreatic body was much less appreciated.  There was a new fluid collection anterior to the pancreatic body look like a pseudocyst.  I recommended follow-up in the office 2 months later, he never scheduled or showed for that appointment.  He resurfaced 11/2020 with worsening abdominal discomfort.  CT scan 11/2020 showed nodular pancreas," Interval increased main pancreatic ductal dilation and parenchymal atrophy involving the body/tail of the pancreas as well as increase in size of the circumscribed fluid collection involving the body  of the pancreas along the lesser curvature of the stomach now measuring 4.7 x 4.0 x 3.7 cm on image 36/3 and 41/5 previously 3.7 x 2.1 x 1.6 cm with possible ductal connection on image 37/3."  EUS was recommended however he did not show for the appointment.  He eventually resurfaced again 06/2021 and then underwent endoscopic ultrasound 07/2021 that showed "- Portal hypertensive changes (small esophageal varices and portal gastropathy). The distal stomach was biopsied to check for underlying H. pylori. 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 Sent for cytology, CEA, amylase. - No solid pancreatic masses."  cytology results from his pancreatic cyst showed no sign of atypical, cancerous cells.  The CEA was 1572 ng/mL, the amylase was 1538 units/L.  Also biopsies from stomach were positive for H. pylori.  My plan was to repeat imaging after complete 100% alcohol abstinence for 3 to 4 months. ? ? ?HPI: ?This is a very pleasant 51year old man whom I last saw at the time of upper endoscopic ultrasound about 3 months ago.  See those results summarized above. ? ?He continues to drink alcohol, not nearly in the volumes that he was previously but still 4 or 5 beers per week. ? ?He is eating well, he is having no abdominal pains.  He has no fevers or chills. ? ? ?Blood work 08/2021 total bilirubin 6.4, alk phos 239, AST 71, ALT 15, INR 1.5 ? ?His weight is up 7 pounds since his last office visit here 4 months ago.  Same scale. ? ?ROS: complete GI ROS as described in HPI, all other review negative. ? ?  Constitutional:  No unintentional weight loss ? ? ?Past Medical History:  ?Diagnosis Date  ? Alcoholic hepatitis 49/6789  ? 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 Coagulopathy ([place])   ? Attributed to liver disease/cirrhosis  ? Pancreatic lesion  11/2019  ? Initially concerning for neoplasm but improved appearance on MRI 12/2019 at which time pseudocyst was most likely diagnosis.  ? Pancreatitis 11/2019  ? Attributed to alcohol abuse  ? Renal disorder   ? 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 Seizures ([place])   ? Thrombocytopenia ([place])   ? ? ?Past Surgical History:  ?Procedure Laterality Date  ? BIOPSY  08/02/2021  ? Procedure: BIOPSY;  Surgeon: Milus Banister, MD;  Location: [facility];  Service: Endoscopy;;  ? ENTEROSCOPY N/A 09/20/2020  ? Procedure: ENTEROSCOPY;  Surgeon: Thornton Park, MD;  Location: [place];  Service: Gastroenterology;  Laterality: N/A;  ? ESOPHAGOGASTRODUODENOSCOPY (EGD) WITH PROPOFOL N/A 08/02/2021  ? Procedure: ESOPHAGOGASTRODUODENOSCOPY (EGD) WITH PROPOFOL;  Surgeon: Milus Banister, MD;  Location: [facility];  Service: Endoscopy;  Laterality: N/A;  ? FINE NEEDLE ASPIRATION N/A 08/02/2021  ? Procedure: FINE NEEDLE ASPIRATION (FNA) LINEAR;  Surgeon: Milus Banister, MD;  Location: [facility];  Service: Endoscopy;  Laterality: N/A;  ? left kidney removed    ? UPPER ESOPHAGEAL ENDOSCOPIC ULTRASOUND (EUS) N/A 08/02/2021  ? Procedure: UPPER ESOPHAGEAL ENDOSCOPIC ULTRASOUND (EUS);  Surgeon: Milus Banister, MD;  Location: [facility];  Service: Endoscopy;  Laterality: N/A;  Radial and Linear  ? ? ?Current Outpatient Medications  ?Medication Instructions  ? aspirin EC 324 mg, Oral, Daily, Swallow whole.  ? atorvastatin (LIPITOR) 20 mg, Oral, Daily  ? gabapentin (NEURONTIN) 300 mg, Oral, Daily at bedtime  ? glipiZIDE (GLUCOTROL) 2.5 mg, Oral, 2 times daily before meals  ? lactulose (encephalopathy) (CHRONULAC) 20 g, Oral, Daily  ? levETIRAcetam (KEPPRA) 500 mg, Oral, 2 times daily  ? metFORMIN (GLUCOPHAGE) 1,000 mg, Oral, 2 times daily with meals  ? Multiple Vitamin (MULTIVITAMIN) tablet 1 tablet, Oral, Nightly  ? Multiple Vitamins-Minerals (LIVER DETOX) TABS 1 tablet, Oral, Every evening, Liver Cleanse  ? naltrexone  (DEPADE) 50 mg, Oral, Daily  ? pantoprazole (PROTONIX) 40 mg, Oral, Daily  ? potassium chloride SA (KLOR-CON M) 20 MEQ tablet 20 mEq, Oral, Daily  ? ? ?Allergies as of 10/09/2021 - Review Complete 10/09/2021  ?Allergen Reaction Noted  ? Iohexol Other (See Comments) 05/21/2016  ? ? ?Family History  ?Problem Relation Age of Onset  ? Diabetes Mellitus II Mother   ? Colon cancer Neg Hx   ? Esophageal cancer Neg Hx   ? ? ?Social History  ? ?Socioeconomic History  ? Marital status: Single  ?  Spouse name: Not on file  ? Number of children: Not on file  ? Years of education: Not on file  ? Highest education level: Not on file  ?Occupational History  ? Not on file  ?Tobacco Use  ? Smoking status: Former  ?  Packs/day: 0.10  ?  Years: 25.00  ?  Pack years: 2.50  ?  Types: Cigarettes  ?  Quit date: 2022  ?  Years since quitting: 1.2  ? Smokeless tobacco: Never  ?Vaping Use  ? Vaping Use: Never used  ?Substance and Sexual Activity  ? Alcohol use: Not Currently  ?  Alcohol/week: 3.0 standard drinks  ?  Types: 3 Cans of beer per week  ? Drug use: Not Currently  ?  Types: Marijuana  ?  Sexual activity: Not on file  ?Other Topics Concern  ? Not on file  ?Social History Narrative  ? Not on file  ? ?Social Determinants of Health  ? ?Financial Resource Strain: Not on file  ?Food Insecurity: Not on file  ?Transportation Needs: Not on file  ?Physical Activity: Not on file  ?Stress: Not on file  ?Social Connections: Not on file  ?Intimate Partner Violence: Not on file  ? ? ? ?Physical Exam: ?Ht '5\' 4"'  (1.626 m)   Wt 140 lb 2 oz (63.6 kg)   BMI 24.05 kg/m?  ?Constitutional: generally well-appearing ?Psychiatric: alert and oriented x3 ?Abdomen: soft, nontender, nondistended, no obvious ascites, no peritoneal signs, normal bowel sounds ?No peripheral edema noted in lower extremities ? ?Assessment and plan: ?51 y.o. male with alcoholic cirrhosis, abnormal pancreas, H. pylori infection of the stomach ? ?First we need to restage his  cirrhosis.  MELD score 2 months ago was 11.  He continues to drink alcohol but at quite a bit lower volumes than he was previously.  He says he is only drinking 4-5 beers per week now.  He will get a CBC, complete

## 2021-10-10 ENCOUNTER — Other Ambulatory Visit: Payer: Self-pay

## 2021-10-15 ENCOUNTER — Other Ambulatory Visit: Payer: Self-pay

## 2021-10-15 DIAGNOSIS — K709 Alcoholic liver disease, unspecified: Secondary | ICD-10-CM

## 2021-10-15 DIAGNOSIS — Q453 Other congenital malformations of pancreas and pancreatic duct: Secondary | ICD-10-CM

## 2021-10-15 NOTE — Progress Notes (Signed)
You have been scheduled for an MRI at [facility] on 11-13-21. Your appointment time is 9:00am. Please arrive to admitting (at main entrance of the hospital) 30 minutes prior to your appointment time for registration purposes. Please make certain not to have anything to eat or drink 4 hours prior to your test. In addition, if you have any metal in your body, have a pacemaker or defibrillator, please be sure to let your ordering physician know. This test typically takes 45 minutes to 1 hour to complete. Should you need to reschedule, please call [phone] to do so.  Patient made aware of instructions by phone.  Patient agreed to plan and verbalized understanding.  No further questions.  ?

## 2021-11-13 ENCOUNTER — Other Ambulatory Visit: Payer: Self-pay | Admitting: Gastroenterology

## 2021-11-13 ENCOUNTER — Ambulatory Visit (HOSPITAL_COMMUNITY)
Admission: RE | Admit: 2021-11-13 | Discharge: 2021-11-13 | Disposition: A | Discharge status: Home / Self Care | Payer: Self-pay | Source: Ambulatory Visit | Attending: Gastroenterology | Admitting: Gastroenterology

## 2021-11-13 DIAGNOSIS — Q453 Other congenital malformations of pancreas and pancreatic duct: Secondary | ICD-10-CM

## 2021-11-13 DIAGNOSIS — K709 Alcoholic liver disease, unspecified: Secondary | ICD-10-CM

## 2021-11-13 MED ORDER — GADOBUTROL 1 MMOL/ML IV SOLN
6.0000 mL | Freq: Once | INTRAVENOUS | Status: AC | PRN
  Administered 2021-11-13: 6 mL via INTRAVENOUS

## 2021-11-14 ENCOUNTER — Other Ambulatory Visit: Payer: Self-pay

## 2021-11-14 DIAGNOSIS — K8689 Other specified diseases of pancreas: Secondary | ICD-10-CM

## 2021-11-19 ENCOUNTER — Encounter (HOSPITAL_COMMUNITY): Payer: Self-pay

## 2021-11-19 ENCOUNTER — Emergency Department (HOSPITAL_COMMUNITY)
Admission: EM | Admit: 2021-11-19 | Discharge: 2021-11-19 | Disposition: A | Discharge status: Home / Self Care | Payer: Self-pay | Attending: Emergency Medicine | Admitting: Emergency Medicine

## 2021-11-19 DIAGNOSIS — R000 Tachycardia, unspecified: Secondary | ICD-10-CM | POA: Insufficient documentation

## 2021-11-19 DIAGNOSIS — Z7982 Long term (current) use of aspirin: Secondary | ICD-10-CM | POA: Insufficient documentation

## 2021-11-19 DIAGNOSIS — Z7984 Long term (current) use of oral hypoglycemic drugs: Secondary | ICD-10-CM | POA: Insufficient documentation

## 2021-11-19 DIAGNOSIS — G40909 Epilepsy, unspecified, not intractable, without status epilepticus: Secondary | ICD-10-CM | POA: Insufficient documentation

## 2021-11-19 DIAGNOSIS — D649 Anemia, unspecified: Secondary | ICD-10-CM | POA: Insufficient documentation

## 2021-11-19 DIAGNOSIS — R569 Unspecified convulsions: Secondary | ICD-10-CM

## 2021-11-19 DIAGNOSIS — D696 Thrombocytopenia, unspecified: Secondary | ICD-10-CM | POA: Insufficient documentation

## 2021-11-19 LAB — CBC
HCT: 25.5 % — ABNORMAL LOW (ref 39.0–52.0)
Hemoglobin: 8.2 g/dL — ABNORMAL LOW (ref 13.0–17.0)
MCH: 25.5 pg — ABNORMAL LOW (ref 26.0–34.0)
MCHC: 32.2 g/dL (ref 30.0–36.0)
MCV: 79.2 fL — ABNORMAL LOW (ref 80.0–100.0)
Platelets: 90 10*3/uL — ABNORMAL LOW (ref 150–400)
RBC: 3.22 MIL/uL — ABNORMAL LOW (ref 4.22–5.81)
RDW: 29.9 % — ABNORMAL HIGH (ref 11.5–15.5)
WBC: 6.4 10*3/uL (ref 4.0–10.5)
nRBC: 0 % (ref 0.0–0.2)

## 2021-11-19 LAB — COMPREHENSIVE METABOLIC PANEL
ALT: 38 U/L (ref 0–44)
AST: 210 U/L — ABNORMAL HIGH (ref 15–41)
Albumin: 3.3 g/dL — ABNORMAL LOW (ref 3.5–5.0)
Alkaline Phosphatase: 245 U/L — ABNORMAL HIGH (ref 38–126)
Anion gap: 13 (ref 5–15)
BUN: 6 mg/dL (ref 6–20)
CO2: 20 mmol/L — ABNORMAL LOW (ref 22–32)
Calcium: 8.2 mg/dL — ABNORMAL LOW (ref 8.9–10.3)
Chloride: 101 mmol/L (ref 98–111)
Creatinine, Ser: 0.48 mg/dL — ABNORMAL LOW (ref 0.61–1.24)
GFR, Estimated: >60 mL/min (ref >60)
Glucose, Bld: 172 mg/dL — ABNORMAL HIGH (ref 70–99)
Potassium: 3.5 mmol/L (ref 3.5–5.1)
Sodium: 134 mmol/L — ABNORMAL LOW (ref 135–145)
Total Bilirubin: 6.7 mg/dL — ABNORMAL HIGH (ref 0.3–1.2)
Total Protein: 8.2 g/dL — ABNORMAL HIGH (ref 6.5–8.1)

## 2021-11-19 LAB — CBG MONITORING, ED: Glucose-Capillary: 195 mg/dL — ABNORMAL HIGH (ref 70–99)

## 2021-11-19 LAB — ETHANOL: Alcohol, Ethyl (B): <10 mg/dL (ref <10)

## 2021-11-19 MED ORDER — LACTATED RINGERS IV BOLUS
1000.0000 mL | Freq: Once | INTRAVENOUS | Status: AC
  Administered 2021-11-19: 1000 mL via INTRAVENOUS

## 2021-11-19 MED ORDER — LEVETIRACETAM IN NACL 1000 MG/100ML IV SOLN
1000.0000 mg | Freq: Once | INTRAVENOUS | Status: AC
Start: 2021-11-19 — End: 2021-11-19
  Administered 2021-11-19: 1000 mg via INTRAVENOUS
  Filled 2021-11-19: qty 100

## 2021-11-19 NOTE — ED Triage Notes (Signed)
Pt arrived via [facility] from a bus. Pt had a witnessed seizure on the bus that lasted 1-2 min.  ? ?Pt had another witnessed seizure while getting off the bus. ? ?Not postictal either time.  ? ?Pt did not take his Keppra today or any other meds.  ? ?A/Ox4 ?C/O N/V that started yesterday that could be contributing to his sx. Possibly febrile.  ? ?T-99.5 ?CBG-129 ?98% RA ?P-104 ST ?131/82 ?RR-018 ?

## 2021-11-19 NOTE — Discharge Instructions (Addendum)
Do not drink alcohol ?Drink plenty of fluids ?Take your medications as prescribed ?You need to have your blood count rechecked with your doctor this week ?Return if you are getting weaker, lightheaded, have chest pain, shortness of breath, or other new symptoms. ?

## 2021-11-19 NOTE — ED Provider Notes (Signed)
?[facility] DEPT ?Provider Note ? ? ?CSN: [id number] ?Arrival date & time: 11/19/21  1127 ? ?  ? ?History ? ?Chief Complaint  ?Patient presents with  ? Seizures  ? ? ?Gary Mitchell is a 51 y.o. male. ? ?HPI ?51 year old male history of seizure disorder presents today with report of seizure x2 on city bus today.  He reports taking his Keppra as prescribed.  He states that he felt somewhat weak before leaving his home today.  He does drink alcohol but has not had any alcohol for the past weeks daily is just not felt like drinking it.  He denies any pain or injuries ?  ? ?Home Medications ?Prior to Admission medications   ?Medication Sig Start Date End Date Taking? Authorizing Provider  ?aspirin EC 81 MG tablet Take 324 mg by mouth daily. Swallow whole.    [provider]  ?atorvastatin (LIPITOR) 20 MG tablet Take 1 tablet (20 mg total) by mouth daily. 05/29/21   Charlott Rakes, MD  ?gabapentin (NEURONTIN) 300 MG capsule Take 1 capsule (300 mg total) by mouth at bedtime. 08/22/21   Charlott Rakes, MD  ?glipiZIDE (GLUCOTROL) 5 MG tablet Take 0.5 tablets (2.5 mg total) by mouth 2 (two) times daily before a meal. 08/22/21   Charlott Rakes, MD  ?lactulose, encephalopathy, (CHRONULAC) 10 GM/15ML SOLN Take 30 mLs (20 g total) by mouth daily. 06/21/21   Levin Erp, PA  ?levETIRAcetam (KEPPRA) 500 MG tablet Take 1 tablet (500 mg total) by mouth 2 (two) times daily. 10/03/20 05/06/22  Charlott Rakes, MD  ?metFORMIN (GLUCOPHAGE) 500 MG tablet Take 2 tablets (1,000 mg total) by mouth 2 (two) times daily with a meal. 05/29/21   Charlott Rakes, MD  ?Multiple Vitamin (MULTIVITAMIN) tablet Take 1 tablet by mouth at bedtime.    [provider]  ?Multiple Vitamins-Minerals (LIVER DETOX) TABS Take 1 tablet by mouth every evening. Liver Cleanse    [provider]  ?naltrexone (DEPADE) 50 MG tablet Take 1 tablet (50 mg total) by mouth daily. 10/03/20   Charlott Rakes,  MD  ?pantoprazole (PROTONIX) 40 MG tablet Take 1 tablet (40 mg total) by mouth daily. ?Patient taking differently: Take 40 mg by mouth at bedtime. 05/29/21   Charlott Rakes, MD  ?potassium chloride SA (KLOR-CON M) 20 MEQ tablet Take 1 tablet (20 mEq total) by mouth daily. 05/07/21   Petrucelli, Glynda Jaeger, PA-C  ?   ? ?Allergies    ?Iohexol   ? ?Review of Systems   ?Review of Systems  ?HENT: Negative.    ?Eyes: Negative.   ?Respiratory: Negative.    ?Cardiovascular: Negative.   ?Gastrointestinal: Negative.   ?Neurological:  Positive for weakness.  ? ?Physical Exam ?Updated Vital Signs ?BP 138/87   Pulse 91   Temp 99.4 ?F (37.4 ?C) (Rectal)   Resp 20   SpO2 100%  ?Physical Exam ?Vitals and nursing note reviewed.  ?Constitutional:   ?   General: He is not in acute distress. ?   Appearance: Normal appearance.  ?   Comments: Patient warm to touch  ?HENT:  ?   Head: Normocephalic and atraumatic.  ?   Right Ear: External ear normal.  ?   Left Ear: External ear normal.  ?   Nose: Nose normal.  ?   Mouth/Throat:  ?   Mouth: Mucous membranes are moist.  ?   Pharynx: Oropharynx is clear.  ?   Comments: No trauma or abrasions noted to tongue or  mucous membranes ?Eyes:  ?   Extraocular Movements: Extraocular movements intact.  ?   Pupils: Pupils are equal, round, and reactive to light.  ?Cardiovascular:  ?   Rate and Rhythm: Regular rhythm. Tachycardia present.  ?   Pulses: Normal pulses.  ?   Heart sounds: Normal heart sounds.  ?Pulmonary:  ?   Effort: Pulmonary effort is normal.  ?   Breath sounds: Normal breath sounds.  ?Abdominal:  ?   General: Bowel sounds are normal.  ?   Palpations: Abdomen is soft.  ?   Tenderness: There is no abdominal tenderness.  ?Musculoskeletal:     ?   General: Normal range of motion.  ?   Cervical back: Normal range of motion.  ?Skin: ?   General: Skin is warm and dry.  ?   Capillary Refill: Capillary refill takes less than 2 seconds.  ?Neurological:  ?   General: No focal deficit present.   ?   Mental Status: He is alert.  ?   Cranial Nerves: No cranial nerve deficit.  ?   Sensory: No sensory deficit.  ?   Motor: No weakness.  ?   Coordination: Coordination normal.  ?   Deep Tendon Reflexes: Reflexes normal.  ?Psychiatric:     ?   Mood and Affect: Mood normal.     ?   Behavior: Behavior normal.  ? ? ?ED Results / Procedures / Treatments   ?Labs ?(all labs ordered are listed, but only abnormal results are displayed) ?Labs Reviewed  ?CBC - Abnormal; Notable for the following components:  ?    Result Value  ? RBC 3.22 (*)   ? Hemoglobin 8.2 (*)   ? HCT 25.5 (*)   ? MCV 79.2 (*)   ? MCH 25.5 (*)   ? RDW 29.9 (*)   ? Platelets 90 (*)   ? All other components within normal limits  ?COMPREHENSIVE METABOLIC PANEL - Abnormal; Notable for the following components:  ? Sodium 134 (*)   ? CO2 20 (*)   ? Glucose, Bld 172 (*)   ? Creatinine, Ser 0.48 (*)   ? Calcium 8.2 (*)   ? Total Protein 8.2 (*)   ? Albumin 3.3 (*)   ? AST 210 (*)   ? Alkaline Phosphatase 245 (*)   ? Total Bilirubin 6.7 (*)   ? All other components within normal limits  ?CBG MONITORING, ED - Abnormal; Notable for the following components:  ? Glucose-Capillary 195 (*)   ? All other components within normal limits  ?ETHANOL  ? ? ?EKG ?None ? ?Radiology ?No results found. ? ?Procedures ?Procedures  ? ? ?Medications Ordered in ED ?Medications  ?lactated ringers bolus 1,000 mL (1,000 mLs Intravenous New Bag/Given 11/19/21 1157)  ?levETIRAcetam (KEPPRA) IVPB 1000 mg/100 mL premix (0 mg Intravenous Stopped 11/19/21 1249)  ? ? ?ED Course/ Medical Decision Making/ A&P ?Clinical Course as of 11/19/21 1254  ?Mon Nov 19, 2021  ?1243 CBC(!) [DR]  ?1243 CBC reviewed interpreted and significant for hemoglobin of 8.2 and thrombocytopenia with platelets of 90,000 [DR]  ?1244 Previous platelets 151 and previous hemoglobin 9.55-monthago [DR]  ?11610Complete metabolic panel reviewed and interpreted and compared to prior with CO2 at 20, glucose 172, creatinine 1.48  and sodium 134 all appear stable from first prior [DR]  ?  ?Clinical Course User Index ?[DR] RPattricia Boss MD  ? ?                        ?  Medical Decision Making ?51 year old male with known seizure disorder presents today with seizure x2 while on city bus. ?Differential diagnosis for seizures includes known seizure disorder, noncompliance with medication, alcohol withdrawal, other toxins, other trauma to head, metabolic abnormalities including electrolyte derangements. ?Patient reports he has been taking his seizure medications although did not take it today. ?He appears somewhat volume depleted ?He is rehydrated here in the ED ?He is given 1 g of Keppra ?Cannot rule out alcohol withdrawal.  Per prior notes patient always states that his last drink was 2 weeks ago.  That is what he stated today.  His alcohol level is nondetectable. ?It is also likely that he is not completely compliant with medications so is unclear when he last took his Keppra.  Is given Keppra here. ?He appears to be at his baseline mental status ?He does have anemia and thrombocytopenia.  Does not appear to be actively bleeding.  These are consistent with alcohol use.  Review of records reveal that he has had GI bleeding in the past.  He is scheduled for follow-up endoscopy. ?He also has known liver cirrhosis secondary to alcohol abuse and history of pancreatitis. ?Patient's vital signs have normalized with decrease in heart rate here in the ED with IV fluids ?He has remained hemodynamically stable. ?He has had no repeat seizures ?Patient is advised to continue his outpatient medications as prescribed especially his seizure medications, to avoid alcohol, and to follow-up with his endoscopies. ?He is also advised to return if she has any change in his status especially bleeding or return of seizures. ? ? ? ?Amount and/or Complexity of Data Reviewed ?Independent Historian: EMS ?External Data Reviewed: labs. ?   Details: Labs and discharge from  previous admissions reviewed ?Labs: ordered. Decision-making details documented in ED Course. ?Radiology: ordered and independent interpretation performed. Decision-making details documented in ED Course. ?

## 2021-11-20 ENCOUNTER — Ambulatory Visit: Payer: Self-pay | Attending: Family Medicine | Admitting: Family Medicine

## 2021-11-20 ENCOUNTER — Encounter: Payer: Self-pay | Admitting: Family Medicine

## 2021-11-20 ENCOUNTER — Other Ambulatory Visit: Payer: Self-pay

## 2021-11-20 VITALS — BP 137/83 | HR 101 | Ht 64.0 in | Wt 144.8 lb

## 2021-11-20 DIAGNOSIS — E11649 Type 2 diabetes mellitus with hypoglycemia without coma: Secondary | ICD-10-CM

## 2021-11-20 DIAGNOSIS — E1165 Type 2 diabetes mellitus with hyperglycemia: Secondary | ICD-10-CM

## 2021-11-20 DIAGNOSIS — E1169 Type 2 diabetes mellitus with other specified complication: Secondary | ICD-10-CM

## 2021-11-20 DIAGNOSIS — R569 Unspecified convulsions: Secondary | ICD-10-CM

## 2021-11-20 DIAGNOSIS — K709 Alcoholic liver disease, unspecified: Secondary | ICD-10-CM

## 2021-11-20 DIAGNOSIS — E1142 Type 2 diabetes mellitus with diabetic polyneuropathy: Secondary | ICD-10-CM

## 2021-11-20 DIAGNOSIS — K219 Gastro-esophageal reflux disease without esophagitis: Secondary | ICD-10-CM

## 2021-11-20 LAB — POCT GLYCOSYLATED HEMOGLOBIN (HGB A1C): HbA1c, POC (controlled diabetic range): 5.5 % (ref 0.0–7.0)

## 2021-11-20 LAB — GLUCOSE, POCT (MANUAL RESULT ENTRY): POC Glucose: 127 mg/dl — AB (ref 70–99)

## 2021-11-20 MED ORDER — PANTOPRAZOLE SODIUM 40 MG PO TBEC
40.0000 mg | DELAYED_RELEASE_TABLET | Freq: Every day | ORAL | 3 refills | Status: DC
  Filled 2021-11-20: qty 30, 30d supply, fill #0

## 2021-11-20 MED ORDER — GABAPENTIN 300 MG PO CAPS
300.0000 mg | ORAL_CAPSULE | Freq: Every day | ORAL | 3 refills | Status: DC
  Filled 2021-11-20: qty 30, 30d supply, fill #0
  Filled 2022-05-02: qty 30, 30d supply, fill #1
  Filled 2022-07-10: qty 30, 30d supply, fill #2
  Filled 2022-09-19 (×2): qty 30, 30d supply, fill #3

## 2021-11-20 MED ORDER — LEVETIRACETAM 500 MG PO TABS
500.0000 mg | ORAL_TABLET | Freq: Two times a day (BID) | ORAL | 3 refills | Status: DC
  Filled 2021-11-20: qty 60, 30d supply, fill #0
  Filled 2022-01-17 – 2022-02-05 (×2): qty 60, 30d supply, fill #1
  Filled 2022-05-02: qty 60, 30d supply, fill #2

## 2021-11-20 MED ORDER — METFORMIN HCL 500 MG PO TABS
1000.0000 mg | ORAL_TABLET | Freq: Two times a day (BID) | ORAL | 3 refills | Status: DC
  Filled 2021-11-20: qty 120, 30d supply, fill #0
  Filled 2022-02-05: qty 120, 30d supply, fill #1
  Filled 2022-05-02: qty 120, 30d supply, fill #2
  Filled 2022-07-10: qty 120, 30d supply, fill #3

## 2021-11-20 NOTE — Progress Notes (Signed)
Needs refill on Keppra. ?

## 2021-11-20 NOTE — Progress Notes (Signed)
? ?Subjective:  ?Patient ID: Gary Mitchell, male    DOB: 1970/08/16  Age: 51 y.o. MRN: [id number] ? ?CC: Diabetes ? ? ?HPI ?Gary Mitchell is a 51 y.o. year old male with a history of alcoholic liver disease, pancreatitis, history of alcoholic hepatitis in 10/979, alcohol-related seizures, type 2 diabetes mellitus (A1c of 5.5), previously smoked (quit 2 cig/day and quit 2022) who presents today for follow-up. ? ?Interval History: ?He is closely followed by GI at his last visit in 10/2021 for management of his liver cirrhosis.  He informs me he quit alcohol 3 weeks ago but then on further questioning he admits to drinking alcohol 3 days ago on his birthday. ?Denies presence of abdominal pain, nausea, vomiting. ?He has upcoming EGD in 01/2022. ? 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 The distal ?stomach was biopsied to check for underlying H. pylori. 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 Sent for cytology, CEA, amylase. ?- No solid pancreatic masses. ? ?[place] so was taken to [place] ED yesterday. States he had run out of Keppra. ?Today he is in need of refill of his chronic medications. ?For his diabetes he has been adherent with glipizide and metformin and denies hypoglycemic symptoms. ?Past Medical History:  ?Diagnosis Date  ? Alcoholic hepatitis 019/1478 ? Cirrhosis with alcoholism ([facility] 11/2019  ? Coagulopathy ([facility]   ? Attributed to liver disease/cirrhosis  ? Pancreatic lesion 11/2019  ? Initially concerning for neoplasm but improved appearance on MRI 12/2019 at which time pseudocyst was most likely diagnosis.  ? Pancreatitis 11/2019  ? Attributed to alcohol abuse  ? Renal disorder   ? states kidney removal when he  was a baby  ? [place]iz[place] ([place]   ? Thrombocytopenia ([place]   ? ? ?Past Surgical History:  ?Procedure Laterality Date  ? BIOPSY  08/02/2021  ? Procedure: BIOPSY;  Surgeon: JMilus Banister MD;  [facility];  Service: Endoscopy;;  ? ENTEROSCOPY N/A 09/20/2020  ? Procedure: ENTEROSCOPY;  Surgeon: BThornton Park MD;  [facility]  Service: Gastroenterology;  Laterality: N/A;  ? ESOPHAGOGASTRODUODENOSCOPY (EGD) WITH PROPOFOL N/A 08/02/2021  ? Procedure: ESOPHAGOGASTRODUODENOSCOPY (EGD) WITH PROPOFOL;  Surgeon: JMilus Banister MD;  [facility];  Service: Endoscopy;  Laterality: N/A;  ? FINE NEEDLE ASPIRATION N/A 08/02/2021  ? Procedure: FINE NEEDLE ASPIRATION (FNA) LINEAR;  Surgeon: JMilus Banister MD;  [facility];  Service: Endoscopy;  Laterality: N/A;  ? left kidney removed    ? UPPER ESOPHAGEAL ENDOSCOPIC ULTRASOUND (EUS) N/A 08/02/2021  ? Procedure: UPPER ESOPHAGEAL ENDOSCOPIC ULTRASOUND (EUS);  Surgeon: JMilus Banister MD;  [facility];  Service: Endoscopy;  Laterality: N/A;  Radial and Linear  ? ? ?Family History  ?Problem Relation Age of Onset  ? Diabetes Mellitus II Mother   ? Colon cancer Neg Hx   ? Esophageal cancer Neg Hx   ? ? ?Social History  ? ?Socioeconomic History  ? Marital status: Single  ?  Spouse name: Not on file  ? Number of children: Not on file  ? Years of education: Not on file  ? Highest education level: Not on file  ?Occupational History  ? Not on file  ?Tobacco Use  ? Smoking status: Former  ?  Packs/day: 0.10  ?  Years: 25.00  ?  Pack years: 2.50  ?  Types: Cigarettes  ?  Quit date: 2022  ?  Years since quitting: 1.3  ? Smokeless tobacco: Never  ?Vaping Use  ? Vaping Use: Never used  ?Substance and Sexual Activity  ? Alcohol use: Not Currently  ?  Alcohol/week: 3.0 standard drinks  ?  Types: 3 Cans of beer per week  ? Drug use: Not Currently  ?  Types: Marijuana  ? Sexual activity: Not on file  ?Other Topics Concern  ? Not on file   ?Social History Narrative  ? Not on file  ? ?Social Determinants of Health  ? ?Financial Resource Strain: Not on file  ?Food Insecurity: Not on file  ?Transportation Needs: Not on file  ?Physical Activity: Not on file  ?Stress: Not on file  ?Social Connections: Not on file  ? ? ?Allergies  ?Allergen Reactions  ? Iohexol Other (See Comments)  ?  Unknown reaction at 46 days old ?Mom at bedside reported patient was given injections of iohexol in foot and resulted in "hole in foot"  ? ? ?Outpatient Medications Prior to Visit  ?Medication Sig Dispense Refill  ? aspirin EC 81 MG tablet Take 324 mg by mouth daily. Swallow whole.    ? atorvastatin (LIPITOR) 20 MG tablet Take 1 tablet (20 mg total) by mouth daily. 30 tablet 3  ? lactulose, encephalopathy, (CHRONULAC) 10 GM/15ML SOLN Take 30 mLs (20 g total) by mouth daily. 900 mL 3  ? Multiple Vitamin (MULTIVITAMIN) tablet Take 1 tablet by mouth at bedtime.    ? Multiple Vitamins-Minerals (LIVER DETOX) TABS Take 1 tablet by mouth every evening. Liver Cleanse    ? naltrexone (DEPADE) 50 MG tablet Take 1 tablet (50 mg total) by mouth daily. 30 tablet 3  ? potassium chloride SA (KLOR-CON M) 20 MEQ tablet Take 1 tablet (20 mEq total) by mouth daily. 3 tablet 0  ? gabapentin (NEURONTIN) 300 MG capsule Take 1 capsule (300 mg total) by mouth at bedtime. 30 capsule 3  ? glipiZIDE (GLUCOTROL) 5 MG tablet Take 0.5 tablets (2.5 mg total) by mouth 2 (two) times daily before a meal. 30 tablet 3  ? levETIRAcetam (KEPPRA) 500 MG tablet Take 1 tablet (500 mg total) by mouth 2 (two) times daily. 60 tablet 3  ? metFORMIN (GLUCOPHAGE) 500 MG tablet Take 2 tablets (1,000 mg total) by mouth 2 (two) times daily with a meal. 120 tablet 3  ? pantoprazole (PROTONIX) 40 MG tablet Take 1 tablet (40 mg total) by mouth daily. (Patient taking differently: Take 40 mg by mouth at bedtime.) 30 tablet 3  ? ?No facility-administered medications prior to visit.  ? ? ? ?ROS ?Review of Systems  ?Constitutional:   Negative for activity change and appetite change.  ?HENT:  Negative for sinus pressure and sore throat.   ?Eyes:  Negative for visual disturbance.  ?Respiratory:  Negative for cough, chest tightness and shortness of breath.   ?Cardiovascular:  Negative for chest pain and leg swelling.  ?Gastrointestinal:  Negative for abdominal distention, abdominal pain, constipation and diarrhea.  ?Endocrine: Negative.   ?Genitourinary:  Negative for dysuria.  ?Musculoskeletal:  Negative for joint swelling and myalgias.  ?Skin:  Negative for rash.  ?Allergic/Immunologic: Negative.   ?Neurological:  Negative for weakness, light-headedness and numbness.  ?Psychiatric/Behavioral:  Negative for dysphoric mood and suicidal ideas.   ? ?Objective:  ?BP 137/83   Pulse (!) 101   Ht  $'5\' 4"'F$  (1.626 m)   Wt 144 lb 12.8 oz (65.7 kg)   SpO2 100%   BMI 24.85 kg/m?  ? ? ?  11/20/2021  ?  2:23 PM 11/19/2021  ? 12:45 PM 11/19/2021  ? 12:30 PM  ?BP/Weight  ?Systolic BP 308 657 846  ?Diastolic BP 83 87 86  ?Wt. (Lbs) 144.8    ?BMI 24.85 kg/m2    ? ? ? ? ?Physical Exam ?Constitutional:   ?   Appearance: He is well-developed.  ?Eyes:  ?   General: Scleral icterus present.  ?Cardiovascular:  ?   Rate and Rhythm: Tachycardia present.  ?   Heart sounds: Normal heart sounds. No murmur heard. ?Pulmonary:  ?   Effort: Pulmonary effort is normal.  ?   Breath sounds: Normal breath sounds. No wheezing or rales.  ?Chest:  ?   Chest wall: No tenderness.  ?Abdominal:  ?   General: Bowel sounds are normal. There is no distension.  ?   Palpations: Abdomen is soft. There is no mass.  ?   Tenderness: There is no abdominal tenderness.  ?Musculoskeletal:     ?   General: Normal range of motion.  ?   Right lower leg: No edema.  ?   Left lower leg: No edema.  ?Neurological:  ?   Mental Status: He is alert and oriented to person, place, and time.  ?Psychiatric:     ?   Mood and Affect: Mood normal.  ? ?Diabetic Foot Exam - Simple   ?Simple Foot Form ?Diabetic Foot exam was  performed with the following findings: Yes 11/20/2021  2:51 PM  ?Visual Inspection ?No deformities, no ulcerations, no other skin breakdown bilaterally: Yes ?Sensation Testing ?Intact to touch and monofilament

## 2021-11-20 NOTE — Patient Instructions (Signed)
Discontinue Glipizide to prevent low blood sugar.  Continue Metformin for your diabetes instead. ?Hold your Atorvastatin due to your elevated liver enzymes.  Once your liver enzymes improve we can restart your Atorvastatin. ?

## 2021-11-21 ENCOUNTER — Encounter: Payer: Self-pay | Admitting: Family Medicine

## 2022-01-17 ENCOUNTER — Other Ambulatory Visit: Payer: Self-pay

## 2022-01-23 ENCOUNTER — Other Ambulatory Visit: Payer: Self-pay

## 2022-01-24 ENCOUNTER — Encounter (HOSPITAL_COMMUNITY): Payer: Self-pay | Admitting: Gastroenterology

## 2022-01-31 ENCOUNTER — Encounter (HOSPITAL_COMMUNITY)
Admission: RE | Disposition: A | Discharge status: Home / Self Care | Payer: Self-pay | Source: Home / Self Care | Attending: Gastroenterology

## 2022-01-31 ENCOUNTER — Other Ambulatory Visit: Payer: Self-pay

## 2022-01-31 ENCOUNTER — Ambulatory Visit (HOSPITAL_BASED_OUTPATIENT_CLINIC_OR_DEPARTMENT_OTHER): Payer: Self-pay | Admitting: Certified Registered Nurse Anesthetist

## 2022-01-31 ENCOUNTER — Encounter (HOSPITAL_COMMUNITY): Payer: Self-pay | Admitting: Gastroenterology

## 2022-01-31 ENCOUNTER — Ambulatory Visit (HOSPITAL_COMMUNITY)
Admission: RE | Admit: 2022-01-31 | Discharge: 2022-01-31 | Disposition: A | Discharge status: Home / Self Care | Payer: Self-pay | Attending: Gastroenterology | Admitting: Gastroenterology

## 2022-01-31 ENCOUNTER — Ambulatory Visit (HOSPITAL_COMMUNITY): Payer: Self-pay | Admitting: Certified Registered Nurse Anesthetist

## 2022-01-31 DIAGNOSIS — Z87891 Personal history of nicotine dependence: Secondary | ICD-10-CM | POA: Insufficient documentation

## 2022-01-31 DIAGNOSIS — E119 Type 2 diabetes mellitus without complications: Secondary | ICD-10-CM

## 2022-01-31 DIAGNOSIS — D759 Disease of blood and blood-forming organs, unspecified: Secondary | ICD-10-CM | POA: Insufficient documentation

## 2022-01-31 DIAGNOSIS — Z7984 Long term (current) use of oral hypoglycemic drugs: Secondary | ICD-10-CM | POA: Insufficient documentation

## 2022-01-31 DIAGNOSIS — D649 Anemia, unspecified: Secondary | ICD-10-CM | POA: Insufficient documentation

## 2022-01-31 DIAGNOSIS — Z8673 Personal history of transient ischemic attack (TIA), and cerebral infarction without residual deficits: Secondary | ICD-10-CM | POA: Insufficient documentation

## 2022-01-31 DIAGNOSIS — I8500 Esophageal varices without bleeding: Secondary | ICD-10-CM | POA: Insufficient documentation

## 2022-01-31 DIAGNOSIS — K766 Portal hypertension: Secondary | ICD-10-CM | POA: Insufficient documentation

## 2022-01-31 DIAGNOSIS — K862 Cyst of pancreas: Secondary | ICD-10-CM

## 2022-01-31 DIAGNOSIS — K219 Gastro-esophageal reflux disease without esophagitis: Secondary | ICD-10-CM | POA: Insufficient documentation

## 2022-01-31 DIAGNOSIS — R569 Unspecified convulsions: Secondary | ICD-10-CM | POA: Insufficient documentation

## 2022-01-31 DIAGNOSIS — K8689 Other specified diseases of pancreas: Secondary | ICD-10-CM

## 2022-01-31 DIAGNOSIS — K3189 Other diseases of stomach and duodenum: Secondary | ICD-10-CM | POA: Insufficient documentation

## 2022-01-31 HISTORY — PX: FINE NEEDLE ASPIRATION: SHX5430

## 2022-01-31 HISTORY — PX: ESOPHAGOGASTRODUODENOSCOPY (EGD) WITH PROPOFOL: SHX5813

## 2022-01-31 HISTORY — PX: EUS: SHX5427

## 2022-01-31 LAB — GLUCOSE, CAPILLARY: Glucose-Capillary: 160 mg/dL — ABNORMAL HIGH (ref 70–99)

## 2022-01-31 SURGERY — UPPER ENDOSCOPIC ULTRASOUND (EUS) RADIAL
Anesthesia: Monitor Anesthesia Care

## 2022-01-31 MED ORDER — SODIUM CHLORIDE 0.9 % IV SOLN: INTRAVENOUS | Status: DC

## 2022-01-31 MED ORDER — LACTATED RINGERS IV SOLN
INTRAVENOUS | Status: DC
  Administered 2022-01-31: 1000 mL via INTRAVENOUS

## 2022-01-31 MED ORDER — PROPOFOL 1000 MG/100ML IV EMUL
INTRAVENOUS | Status: AC
  Filled 2022-01-31: qty 200

## 2022-01-31 MED ORDER — PROPOFOL 500 MG/50ML IV EMUL
INTRAVENOUS | Status: DC | PRN
  Administered 2022-01-31: 150 ug/kg/min via INTRAVENOUS

## 2022-01-31 MED ORDER — CIPROFLOXACIN IN D5W 400 MG/200ML IV SOLN
INTRAVENOUS | Status: DC | PRN
  Administered 2022-01-31: 400 mg via INTRAVENOUS

## 2022-01-31 MED ORDER — PROPOFOL 10 MG/ML IV BOLUS
INTRAVENOUS | Status: DC | PRN
  Administered 2022-01-31 (×2): 30 mg via INTRAVENOUS
  Administered 2022-01-31 (×2): 20 mg via INTRAVENOUS

## 2022-01-31 MED ORDER — PROPOFOL 500 MG/50ML IV EMUL
INTRAVENOUS | Status: AC
  Filled 2022-01-31: qty 50

## 2022-01-31 MED ORDER — LIDOCAINE 2% (20 MG/ML) 5 ML SYRINGE
INTRAMUSCULAR | Status: DC | PRN
  Administered 2022-01-31: 60 mg via INTRAVENOUS

## 2022-01-31 MED ORDER — CIPROFLOXACIN HCL 500 MG PO TABS
500.0000 mg | ORAL_TABLET | Freq: Two times a day (BID) | ORAL | 0 refills | Status: DC
  Filled 2022-01-31: qty 6, 3d supply, fill #0

## 2022-01-31 MED ORDER — CIPROFLOXACIN IN D5W 400 MG/200ML IV SOLN
INTRAVENOUS | Status: AC
  Filled 2022-01-31: qty 200

## 2022-01-31 SURGICAL SUPPLY — 15 items

## 2022-01-31 NOTE — H&P (Addendum)
Abnormal pancreas.  Presented with acute pancreatitis at the same time as his severe alcoholic hepatitis in May 2021.  Imaging while he was in the hospital also suggested "4 cm ill-defined infiltrating pancreatic body lesion worrisome for infiltrating neoplasm/adenocarcinoma.  This is associated with marked atrophy of the pancreatic tail and dilation of the main pancreatic duct".  Repeat MRI June 2021 was overall much improved.  The "masslike lesion" in the pancreatic body was much less appreciated.  There was a new fluid collection anterior to the pancreatic body look like a pseudocyst.  I recommended follow-up in the office 2 months later, he never scheduled or showed for that appointment.  He resurfaced 11/2020 with worsening abdominal discomfort.  CT scan 11/2020 showed nodular pancreas," Interval increased main pancreatic ductal dilation and parenchymal atrophy involving the body/tail of the pancreas as well as increase in size of the circumscribed fluid collection involving the body of the pancreas along the lesser curvature of the stomach now measuring 4.7 x 4.0 x 3.7 cm on image 36/3 and 41/5 previously 3.7 x 2.1 x 1.6 cm with possible ductal connection on image 37/3."  EUS was recommended however he did not show for the appointment.  He eventually resurfaced again 06/2021 and then underwent endoscopic ultrasound 07/2021 that showed "- Portal hypertensive changes (small esophageal varices and portal gastropathy). The distal stomach was biopsied to check for underlying H. pylori. 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 Sent for cytology, CEA, amylase. - No solid pancreatic masses."  cytology results from his pancreatic cyst showed no sign of atypical, cancerous cells.  The CEA was 1572 ng/mL, the amylase was 1538 units/L.   Also biopsies from stomach were positive for H. pylori.  My plan was to repeat imaging after complete 100% alcohol abstinence for 3 to 4 months.  MRI 11/2021: Pancreas: Interval enlargement of a fluid signal, nonenhancing cystic lesion of the ventral pancreatic body, measuring 4.3 x 3.5 cm, previously 3.5 x 3.3 cm (series 4, image 19). There is again atrophy and ductal dilatation within the pancreatic tail distal to this lesion, measuring up to 0.8 cm in caliber. No solid mass, acute inflammatory changes, or other parenchymal abnormality identified.   HPI: This is a man with abnoraml pancreas, alcohol abuse  Says he's only drinking 2-3 drinks per week. Was initially very confused about why he was here. I am not sure why because we have discussed his situation mutliple times.  I explained again to him that I am unsure if all of his pancreatic disease is due to alcohol, especially since the EUS 672monthago showed high CEA level.  He understands now and agrees to continue   ROS: complete GI ROS as described in HPI, all other review negative.  Constitutional:  No unintentional weight loss   Past Medical History:  Diagnosis Date   Alcoholic hepatitis 0521/3086 Cirrhosis with alcoholism ([place]/2021   Coagulopathy ([facility]   Attributed to liver disease/cirrhosis   Pancreatic lesion 11/2019   Initially concerning for neoplasm but improved appearance on MRI 12/2019 at which time pseudocyst was most likely diagnosis.   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   Seizures ([facility]   Thrombocytopenia ([facility]  Past Surgical History:  Procedure Laterality Date   BIOPSY  08/02/2021   Procedure: BIOPSY;  Surgeon: Milus Banister, MD;  [facility];  Service: Endoscopy;;   ENTEROSCOPY N/A 09/20/2020   Procedure: ENTEROSCOPY;  Surgeon: Thornton Park, MD;  Location: [place];  Service: Gastroenterology;  Laterality:  N/A;   ESOPHAGOGASTRODUODENOSCOPY (EGD) WITH PROPOFOL N/A 08/02/2021   Procedure: ESOPHAGOGASTRODUODENOSCOPY (EGD) WITH PROPOFOL;  Surgeon: Milus Banister, MD;  [facility];  Service: Endoscopy;  Laterality: N/A;   FINE NEEDLE ASPIRATION N/A 08/02/2021   Procedure: FINE NEEDLE ASPIRATION (FNA) LINEAR;  Surgeon: Milus Banister, MD;  [facility];  Service: Endoscopy;  Laterality: N/A;   left kidney removed     UPPER ESOPHAGEAL ENDOSCOPIC ULTRASOUND (EUS) N/A 08/02/2021   Procedure: UPPER ESOPHAGEAL ENDOSCOPIC ULTRASOUND (EUS);  Surgeon: Milus Banister, MD;  [facility];  Service: Endoscopy;  Laterality: N/A;  Radial and Linear    Current Outpatient Medications  Medication Instructions   aspirin EC 324 mg, Oral, Daily, Swallow whole.   atorvastatin (LIPITOR) 20 mg, Oral, Daily   gabapentin (NEURONTIN) 300 mg, Oral, Daily at bedtime   lactulose (encephalopathy) (CHRONULAC) 20 g, Oral, Daily   levETIRAcetam (KEPPRA) 500 mg, Oral, 2 times daily   metFORMIN (GLUCOPHAGE) 1,000 mg, Oral, 2 times daily with meals   Multiple Vitamin (MULTIVITAMIN) tablet 1 tablet, Oral, Nightly   Multiple Vitamins-Minerals (LIVER DETOX) TABS 1 tablet, Oral, Every evening, Liver Cleanse   naltrexone (DEPADE) 50 mg, Oral, Daily   pantoprazole (PROTONIX) 40 mg, Oral, Daily   potassium chloride SA (KLOR-CON M) 20 MEQ tablet 20 mEq, Oral, Daily    Allergies as of 11/14/2021 - Review Complete 10/09/2021  Allergen Reaction Noted   Iohexol Other (See Comments) 05/21/2016    Family History  Problem Relation Age of Onset   Diabetes Mellitus II Mother    Colon cancer Neg Hx    Esophageal cancer Neg Hx     Social History   Socioeconomic History   Marital status: Single    Spouse name: Not on file   Number of children: Not on file   Years of education: Not on file   Highest education level: Not on file  Occupational History   Not on file  Tobacco Use   Smoking status:  Former    Packs/day: 0.10    Years: 25.00    Total pack years: 2.50    Types: Cigarettes    Quit date: 2022    Years since quitting: 1.5   Smokeless tobacco: Never  Vaping Use   Vaping Use: Never used  Substance and Sexual Activity   Alcohol use: Not Currently    Alcohol/week: 3.0 standard drinks of alcohol    Types: 3 Cans of beer per week   Drug use: Not Currently    Types: Marijuana   Sexual activity: Not on file  Other Topics Concern   Not on file  Social History Narrative   Not on file   Social Determinants of Health   Financial Resource Strain: Not on file  Food Insecurity: Not on file  Transportation Needs: Not on file  Physical Activity: Not on file  Stress: Not on file  Social Connections: Not on file  Intimate Partner Violence: Not on file     Physical Exam: BP (!) 145/87   Pulse 75   Temp 98.8 F (37.1 C) (Tympanic)   Resp 15   Ht '5\' 4"'$  (1.626 m)   Wt 65.7  kg   SpO2 100%   BMI 24.86 kg/m  Constitutional: generally well-appearing Psychiatric: alert and oriented x3 Abdomen: soft, nontender, nondistended, no obvious ascites, no peritoneal signs, normal bowel sounds No peripheral edema noted in lower extremities  Assessment and plan: 51 y.o. male with abnormal pancreas, etoh abuse  For repeat EUS today.  Please see the "Patient Instructions" section for addition details about the plan.  Owens Loffler, MD [facility] 01/31/2022, 7:07 AM

## 2022-01-31 NOTE — Anesthesia Preprocedure Evaluation (Signed)
Anesthesia Evaluation  Patient identified by MRN, date of birth, ID band Patient awake    Reviewed: Allergy & Precautions, NPO status , Patient's Chart, lab work & pertinent test results  Airway Mallampati: II  TM Distance: >3 FB Neck ROM: Full    Dental  (+) Dental Advisory Given   Pulmonary former smoker,    breath sounds clear to auscultation       Cardiovascular negative cardio ROS   Rhythm:Regular Rate:Normal     Neuro/Psych Seizures -,  CVA    GI/Hepatic GERD  ,(+)     substance abuse  alcohol use, Hepatitis -  Endo/Other  diabetes, Type 2, Oral Hypoglycemic Agents  Renal/GU Renal disease     Musculoskeletal   Abdominal   Peds  Hematology  (+) Blood dyscrasia, anemia ,   Anesthesia Other Findings   Reproductive/Obstetrics                            Anesthesia Physical Anesthesia Plan  ASA: 3  Anesthesia Plan: MAC   Post-op Pain Management: Minimal or no pain anticipated   Induction:   PONV Risk Score and Plan: 1 and Propofol infusion, Ondansetron and Treatment may vary due to age or medical condition  Airway Management Planned: Natural Airway and Nasal Cannula  Additional Equipment:   Intra-op Plan:   Post-operative Plan:   Informed Consent: I have reviewed the patients History and Physical, chart, labs and discussed the procedure including the risks, benefits and alternatives for the proposed anesthesia with the patient or authorized representative who has indicated his/her understanding and acceptance.       Plan Discussed with:   Anesthesia Plan Comments:         Anesthesia Quick Evaluation

## 2022-01-31 NOTE — Anesthesia Postprocedure Evaluation (Signed)
Anesthesia Post Note  Patient: Gary Mitchell  Procedure(s) Performed: UPPER ENDOSCOPIC ULTRASOUND (EUS) RADIAL ESOPHAGOGASTRODUODENOSCOPY (EGD) WITH PROPOFOL FINE NEEDLE ASPIRATION (FNA) LINEAR     Patient location during evaluation: PACU Anesthesia Type: MAC Level of consciousness: awake and alert Pain management: pain level controlled Vital Signs Assessment: post-procedure vital signs reviewed and stable Respiratory status: spontaneous breathing, nonlabored ventilation, respiratory function stable and patient connected to nasal cannula oxygen Cardiovascular status: stable and blood pressure returned to baseline Postop Assessment: no apparent nausea or vomiting Anesthetic complications: no   No notable events documented.  Last Vitals:  Vitals:   01/31/22 0835 01/31/22 0836  BP:  124/78  Pulse: 74 76  Resp: 15 17  Temp:    SpO2: 100% 100%    Last Pain:  Vitals:   01/31/22 0801  TempSrc: Oral  PainSc:                  Tiajuana Amass

## 2022-01-31 NOTE — Discharge Instructions (Signed)
YOU HAD AN ENDOSCOPIC PROCEDURE TODAY: Refer to the procedure report and other information in the discharge instructions given to you for any specific questions about what was found during the examination. If this information does not answer your questions, please call [place] office at [phone] to clarify.  ° °YOU SHOULD EXPECT: Some feelings of bloating in the abdomen. Passage of more gas than usual. Walking can help get rid of the air that was put into your GI tract during the procedure and reduce the bloating. If you had a lower endoscopy (such as a colonoscopy or flexible sigmoidoscopy) you may notice spotting of blood in your stool or on the toilet paper. Some abdominal soreness may be present for a day or two, also. ° °DIET: Your first meal following the procedure should be a light meal and then it is ok to progress to your normal diet. A half-sandwich or bowl of soup is an example of a good first meal. Heavy or fried foods are harder to digest and may make you feel nauseous or bloated. Drink plenty of fluids but you should avoid alcoholic beverages for 24 hours. If you had a esophageal dilation, please see attached instructions for diet.   ° °ACTIVITY: Your care partner should take you home directly after the procedure. You should plan to take it easy, moving slowly for the rest of the day. You can resume normal activity the day after the procedure however YOU SHOULD NOT DRIVE, use power tools, machinery or perform tasks that involve climbing or major physical exertion for 24 hours (because of the sedation medicines used during the test).  ° °SYMPTOMS TO REPORT IMMEDIATELY: °A gastroenterologist can be reached at any hour. Please call [phone]  for any of the following symptoms:  °Following lower endoscopy (colonoscopy, flexible sigmoidoscopy) °Excessive amounts of blood in the stool  °Significant tenderness, worsening of abdominal pains  °Swelling of the abdomen that is new, acute  °Fever of 100° or  higher  °Following upper endoscopy (EGD, EUS, ERCP, esophageal dilation) °Vomiting of blood or coffee ground material  °New, significant abdominal pain  °New, significant chest pain or pain under the shoulder blades  °Painful or persistently difficult swallowing  °New shortness of breath  °Black, tarry-looking or red, bloody stools ° °FOLLOW UP:  °If any biopsies were taken you will be contacted by phone or by letter within the next 1-3 weeks. Call [phone]  if you have not heard about the biopsies in 3 weeks.  °Please also call with any specific questions about appointments or follow up tests. ° °

## 2022-01-31 NOTE — Transfer of Care (Signed)
Immediate Anesthesia Transfer of Care Note  Patient: Gary Mitchell  Procedure(s) Performed: UPPER ENDOSCOPIC ULTRASOUND (EUS) RADIAL ESOPHAGOGASTRODUODENOSCOPY (EGD) WITH PROPOFOL FINE NEEDLE ASPIRATION (FNA) LINEAR  Patient Location: PACU and Endoscopy Unit  Anesthesia Type:MAC  Level of Consciousness: awake, alert  and patient cooperative  Airway & Oxygen Therapy: Patient Spontanous Breathing and Patient connected to face mask oxygen  Post-op Assessment: Report given to RN and Post -op Vital signs reviewed and stable  Post vital signs: Reviewed and stable  Last Vitals:  Vitals Value Taken Time  BP 134/85 01/31/22 0801  Temp    Pulse 87 01/31/22 0803  Resp    SpO2 98 % 01/31/22 0803  Vitals shown include unvalidated device data.  Last Pain:  Vitals:   01/31/22 0634  TempSrc: Tympanic  PainSc: 0-No pain         Complications: No notable events documented.

## 2022-01-31 NOTE — Op Note (Signed)
[facility] Patient Name: Gary Mitchell Procedure Date: 01/31/2022 MRN: [id number] Attending MD: Milus Banister , MD Date of Birth: 02/16/1971 CSN: [id number] Age: 51 Admit Type: Outpatient Procedure:                Upper EUS Indications:              Chronically abnormal pancreas, see H and P Providers:                Milus Banister, MD, Jaci Carrel, RN, Faustina                            Mbumina, Technician, Luan Moore, Technician Referring MD:              Medicines:                Monitored Anesthesia Care, Cipro 580 mg IV Complications:            No immediate complications. Estimated blood loss:                            None. Estimated Blood Loss:     Estimated blood loss: none. Procedure:                Pre-Anesthesia Assessment:                           - Prior to the procedure, a History and Physical                            was performed, and patient medications and                            allergies were reviewed. The patient's tolerance of                            previous anesthesia was also reviewed. The risks                            and benefits of the procedure and the sedation                            options and risks were discussed with the patient.                            All questions were answered, and informed consent                            was obtained. Prior Anticoagulants: The patient has                            taken no previous anticoagulant or antiplatelet                            agents. ASA Grade Assessment: III - A patient with  severe systemic disease. After reviewing the risks                            and benefits, the patient was deemed in                            satisfactory condition to undergo the procedure.                           After obtaining informed consent, the endoscope was                            passed under direct vision. Throughout the                             procedure, the patient's blood pressure, pulse, and                            oxygen saturations were monitored continuously. The                            [id number] ([id number] Olympus linear ultrasound scope                            was introduced through the mouth, and advanced to                            the second part of duodenum. The upper EUS was                            accomplished without difficulty. The patient                            tolerated the procedure well. Scope In: Scope Out: Findings:      ENDOSCOPIC FINDING: :      Small esophageal varices.      Mild portal gastropathy.      The examined duodenum was endoscopically normal.      ENDOSONOGRAPHIC FINDING: :      1. An anechoic lesion suggestive of a cyst was identified in the       pancreatic tail. It is not in obvious communication with the pancreatic       duct. The lesion measured 40 mm in maximal cross-sectional diameter.       There was a single compartment without septae. The outer wall of the       lesion was thick. There was no associated mass. There was no internal       debris within the fluid-filled cavity. Needle aspiration for fluid was       performed. One pass was made with the 19 gauge needle using a       transgastric approach. The amount of fluid collected was 60 mL. The       fluid was yellow and thin and it was sent for amylase concentration,       cytology and CEA.      2. Pancreatic parenchyma was otherwise normal.  3. CBD was normal, non-dilated.      4. Limited views of the liver, spleen, portal and splenic vessels were       all normal. Impression:               - An anechoic lesion suggestive of a cyst was                            identified in the pancreatic tail. It is not in                            obvious communication with the pancreatic duct. The                            lesion measured 40 mm in maximal cross-sectional                             diameter. There was a single compartment without                            septae. The outer wall of the lesion was thick.                            There was no associated mass. There was no internal                            debris within the fluid-filled cavity. Needle                            aspiration for fluid was performed. One pass was                            made with the 19 gauge needle using a transgastric                            approach. The amount of fluid collected was 60 mL.                            The fluid was yellow and thin and it was sent for                            amylase concentration, cytology and CEA. Moderate Sedation:      Not Applicable - Patient had care per Anesthesia. Recommendation:           - Discharge patient to home.                           - He will complete 3 days of twice daily cipro,                            called into his pharmacy today. Procedure Code(s):        --- Professional ---  43238, Esophagogastroduodenoscopy, flexible,                            transoral; with transendoscopic ultrasound-guided                            intramural or transmural fine needle                            aspiration/biopsy(s), (includes endoscopic                            ultrasound examination limited to the esophagus,                            stomach or duodenum, and adjacent structures) Diagnosis Code(s):        --- Professional ---                           X32.4, Cyst of pancreas CPT copyright 2019 American Medical Association. All rights reserved. The codes documented in this report are preliminary and upon coder review may  be revised to meet current compliance requirements. Milus Banister, MD 01/31/2022 8:10:23 AM This report has been signed electronically. Number of Addenda: 0

## 2022-02-01 ENCOUNTER — Encounter (HOSPITAL_COMMUNITY): Payer: Self-pay | Admitting: Gastroenterology

## 2022-02-01 LAB — CYTOLOGY - NON PAP

## 2022-02-05 ENCOUNTER — Other Ambulatory Visit: Payer: Self-pay

## 2022-02-11 ENCOUNTER — Encounter: Payer: Self-pay | Admitting: Gastroenterology

## 2022-02-12 ENCOUNTER — Encounter: Payer: Self-pay | Admitting: Gastroenterology

## 2022-02-12 NOTE — Progress Notes (Signed)
[facility] evaluation On behalf of Dr. 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  When compared to his previous CEA and amylase levels, these are very similar.  His case will need to be discussed at an [facility] with review of his MRI/MRCP.  Patty, please reach out to the patient and let him know that we will update him after [place] discussion in the coming weeks.  I would like for him to come in at some point and have a CA 19-9 performed.  Santiago Glad, can you please add this patient on for discussion on 9/[facility] for review of previous 2020 imaging and most recent MRI/MRCP in May 2023.  I will review his EUS findings from Dr. Ardis Hughs report.  Question is that this is most concerning for an IPMN with no mass or lesion having been noted on recent MRI imaging.  Would like to hepatobiliary surgery to help discussed next steps in evaluation/surveillance.  Thanks. GM

## 2022-04-30 NOTE — Progress Notes (Signed)
05/03/2022 Gary Mitchell [id number] [birth year]  Referring provider: Charlott Rakes, MD Primary GI doctor: Gary Mitchell (Dr. Ardis Mitchell)  ASSESSMENT AND PLAN:  Alcoholic cirrhosis of liver without ascites, with history of portal hypertension, thrombocytopenia, hyperammonemia MELD score 17 based off INR 10/09/2021 He continues to drink, has jaundice on exam, asterixis  Last drink 3 weeks ago with a 15 pack of 8% beer that he drink on his own.  Does have vomiting afterwards nonbloody, no shaking or what sounds like withdrawals We will repeat INR today to calculate MELD score I am assuming this will likely be worse, possible alcoholic hepatitis at this time. -Alcohol Abstinence counseling discussed with patient as continued use is strongly associated with worsening liver disease progression -Hepatic encephalopathy-Patient does have history of encephalopath, no evidence of hepatic encephalopathy on exam today.  Takes lactulose as needed.  Would suggest getting on a lactulose once or twice daily, discussed etiology of hyperammonia. Due to hepatic encephalopathy and slowed reflexes, patient was advised not to drive. - Ascites- does not have history of ascites.  No evidence of ascites on exam.  -Esophageal varices mild portal gastropathy seen on EUS 01/31/2022, not on beta-blocker, has tachycardia, uncertain if patient's blood pressure would be able to tolerate nadolol or Coreg, consider next OV -Nutrition and low sodium diet discussed with patient and information given -Continue daily multivitamin -ER precautions discussed with the patient.  Pancreatic mass-  Last imaging MRI 11/2021 showed pancreatic cystic lesion EUS 01/2022 showed thickening but no masses. 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 Schedule CT pancreatic protocol, has possible IV allergy, sent in prednisone IV allergy protocol  DOE (dyspnea on exertion) Over the past year, patient has previous history of smoking, worse since COVID a year ago. No associated chest pain, diaphoresis or dizziness.  It is worse with exertion.  No orthopnea.  No lower leg swelling. We will get chest x-ray to rule out infection, effusion We will check labs for possible anemia We will start patient on Protonix every single day once daily for possible LPR ER precautions given, follow-up with primary care, possible COPD  Alcohol abuse -Alcohol Abstinence counseling discussed with patient as continued use is strongly associated with worsening liver disease progression - get on MVIT - resources give  History of Present Illness:  51 y.o. male  with a past medical history of alcoholic cirrhosis with history of acute pancreatitis, thrombocytopenia, seizures, and others listed below, returns to clinic today for evaluation of alcoholic cirrhosis, pancreatic abnormality.  Known to Dr. Ardis Mitchell for presentation of acute pancreatitis in setting of severe alcoholic hepatitis May 2952.  Had ill-defined infiltrating pancreatic body lesion 4 cm at that time.  With marked atrophy of pancreatic tail and dilatation of main pancreatic duct. Repeat MRI June 2021 improved, less appreciated masslike lesion pancreatic body.  With developed pseudocyst. Patient was lost to follow-up until 11/2020 with worsening abdominal discomfort.  CT scan 11/2020 showed nodular pancreas with interval increase main pancreatic ductal dilatation and atrophy. Yet again patient had noncompliance, suggested to have EUS but was not seen again until 06/2021. 07/2021 EUS showed Portal hypertensive changes (small esophageal varices and portal gastropathy). The distal stomach was biopsied to check for underlying H. pylori. - 5cm unilocular,  anechoic  cyst in the body/tail of the pancreas with clear main pancreatid duct communication and upstream dilation (to 88m) which resolved completely after the cyst was aspirated of 70cc thin, yellowish fluid. Sent for cytology, CEA, amylase. - No solid pancreatic masses."  cytology results from his pancreatic cyst showed no sign of atypical, cancerous cells.  The CEA was 1572 ng/mL, the amylase was 1538 units/L.  Also biopsies from stomach were positive for H. pylori.  MRI 11/2021: Pancreas: Interval enlargement of a fluid signal, nonenhancing cystic lesion of the ventral pancreatic body, measuring 4.3 x 3.5 cm, previously 3.5 x 3.3 cm (series 4, image 19). There is again atrophy and ductal dilatation within the pancreatic tail distal to this lesion, measuring up to 0.8 cm in caliber. No solid mass, acute inflammatory changes, or other parenchymal abnormality identified. 01/31/2022 EUS with Dr. JArdis HughsCEA 512, amylase 2828 and results most consistent with IPMN Dr. MRush Landmarkreviewed patient's EUS findings which did not show mass .  He states he has been having trouble breathing since COVID a year ago.  States he has DOE with hills/stairs, denies cough, mucus, wheezing.  He denies chest pain. Some dizziness with standing up quickly.  He has to walk a mile and a half to get to his apartment, he has pain in his hips, knees and ankles with walking.  Does not sound like claudication, sounds more like in his joints.  He has quit smoking cigarettes and marijuana in 3 years.  States he has not been drinking as much, last time 3 weeks ago, no withdraw, "natty daddy beer 8 %", 15 pack.  Has PCP but has not seen.   He has vomiting 2-3 days after he stops drinking, denies shaking, fever, etc afterwards.  Will take protonix once a day AS NEEDED.  Denies dysphagia, melena, bloody emesis.  Denies swelling legs and stomach. Weight is down.   Wt Readings from Last 3 Encounters:  05/03/22 140 lb 4 oz (63.6 kg)   01/31/22 144 lb 13.5 oz (65.7 kg)  11/20/21 144 lb 12.8 oz (65.7 kg)    He  reports that he quit smoking about 21 months ago. His smoking use included cigarettes. He has a 2.50 pack-year smoking history. He has never used smokeless tobacco. He reports that he does not currently use alcohol after a past usage of about 3.0 standard drinks of alcohol per week. He reports that he does not currently use drugs after having used the following drugs: Marijuana. His family history includes Diabetes Mellitus II in his mother.   Current Medications:   Current Outpatient Medications (Endocrine & Metabolic):    metFORMIN (GLUCOPHAGE) 500 MG tablet, Take 2 tablets (1,000 mg total) by mouth 2 (two) times daily with a meal.   predniSONE (DELTASONE) 50 MG tablet, Give 13 hours, 7 hours, and 1 hour prior to contrast dye injection.  Notify pharmacy to schedule dose as appropriate based on procedure date/time.  Current Outpatient Medications (Cardiovascular):    atorvastatin (LIPITOR) 20 MG tablet, Take 1 tablet (20 mg total) by mouth daily.   Current Outpatient Medications (Analgesics):    aspirin EC 81 MG tablet, Take 324 mg by mouth daily. Swallow whole.   Current Outpatient Medications (Other):    ciprofloxacin (CIPRO) 500 MG tablet, Take 1 tablet (500 mg total) by mouth 2 (two) times daily.   gabapentin (NEURONTIN) 300 MG capsule, Take 1 capsule (300 mg total) by mouth at bedtime.   lactulose, encephalopathy, (CHRONULAC) 10 GM/15ML SOLN, Take 30  mLs (20 g total) by mouth daily.   levETIRAcetam (KEPPRA) 500 MG tablet, Take 1 tablet (500 mg total) by mouth 2 (two) times daily.   Multiple Vitamin (MULTIVITAMIN) tablet, Take 1 tablet by mouth at bedtime.   Multiple Vitamins-Minerals (LIVER DETOX) TABS, Take 1 tablet by mouth every evening. Liver Cleanse   naltrexone (DEPADE) 50 MG tablet, Take 1 tablet (50 mg total) by mouth daily.   potassium chloride SA (KLOR-CON M) 20 MEQ tablet, Take 1 tablet (20  mEq total) by mouth daily.   pantoprazole (PROTONIX) 40 MG tablet, Take 1 tablet (40 mg total) by mouth daily.  Surgical History:  He  has a past surgical history that includes left kidney removed; enteroscopy (N/A, 09/20/2020); Upper esophageal endoscopic ultrasound (eus) (N/A, 08/02/2021); Esophagogastroduodenoscopy (egd) with propofol (N/A, 08/02/2021); biopsy (08/02/2021); Fine needle aspiration (N/A, 08/02/2021); EUS (N/A, 01/31/2022); Esophagogastroduodenoscopy (egd) with propofol (N/A, 01/31/2022); and Fine needle aspiration (N/A, 01/31/2022).  Current Medications, Allergies, Past Medical History, Past Surgical History, Family History and Social History were reviewed in [employer] record.  Physical Exam: BP 110/70   Pulse (!) 105   Ht '5\' 4"'$  (1.626 m)   Wt 140 lb 4 oz (63.6 kg)   BMI 24.07 kg/m  General :  Alert, chronically ill/thin male in no acute distress Head:  Normocephalic and atraumatic. Eyes :  scleral icterus,conjunctive pale  Heart:  regular rate and rhythm, muffled heart sounds Pulm: Decreased breath sounds diffusely, no focal wheezing but some upper expiratory wheeze. Abdomen:   Soft, Obese AB, skin exam to areas of erythematous nonhealing ulcers, no warmth or discharge on abdomen, Normal bowel sounds.  no  tenderness . Without guarding and Without rebound, hepatomegaly noted. no  fluid wave, no  shifting dullness.  Extremities:   Without edema. Msk:  Symmetrical without gross deformities. Peripheral pulses intact.  Neurologic: Alert and  oriented x4;  grossly normal neurologically. with asterixis  Skin:   without jaundice.  Psychiatric: Cooperative, flat affect.    Vladimir Crofts, PA-C 05/03/22

## 2022-05-02 ENCOUNTER — Other Ambulatory Visit: Payer: Self-pay

## 2022-05-03 ENCOUNTER — Telehealth: Payer: Self-pay | Admitting: *Deleted

## 2022-05-03 ENCOUNTER — Ambulatory Visit (INDEPENDENT_AMBULATORY_CARE_PROVIDER_SITE_OTHER): Payer: Self-pay | Admitting: Physician Assistant

## 2022-05-03 ENCOUNTER — Other Ambulatory Visit (INDEPENDENT_AMBULATORY_CARE_PROVIDER_SITE_OTHER): Payer: Self-pay

## 2022-05-03 ENCOUNTER — Encounter: Payer: Self-pay | Admitting: Physician Assistant

## 2022-05-03 ENCOUNTER — Ambulatory Visit (INDEPENDENT_AMBULATORY_CARE_PROVIDER_SITE_OTHER)
Admission: RE | Admit: 2022-05-03 | Discharge: 2022-05-03 | Disposition: A | Discharge status: Home / Self Care | Payer: Self-pay | Source: Ambulatory Visit | Attending: Physician Assistant | Admitting: Physician Assistant

## 2022-05-03 ENCOUNTER — Other Ambulatory Visit: Payer: Self-pay

## 2022-05-03 VITALS — BP 110/70 | HR 105 | Ht 64.0 in | Wt 140.2 lb

## 2022-05-03 DIAGNOSIS — K760 Fatty (change of) liver, not elsewhere classified: Secondary | ICD-10-CM

## 2022-05-03 DIAGNOSIS — K219 Gastro-esophageal reflux disease without esophagitis: Secondary | ICD-10-CM

## 2022-05-03 DIAGNOSIS — K7030 Alcoholic cirrhosis of liver without ascites: Secondary | ICD-10-CM

## 2022-05-03 DIAGNOSIS — K8689 Other specified diseases of pancreas: Secondary | ICD-10-CM

## 2022-05-03 DIAGNOSIS — R1900 Intra-abdominal and pelvic swelling, mass and lump, unspecified site: Secondary | ICD-10-CM

## 2022-05-03 DIAGNOSIS — F1010 Alcohol abuse, uncomplicated: Secondary | ICD-10-CM

## 2022-05-03 DIAGNOSIS — R0609 Other forms of dyspnea: Secondary | ICD-10-CM

## 2022-05-03 DIAGNOSIS — E7220 Disorder of urea cycle metabolism, unspecified: Secondary | ICD-10-CM

## 2022-05-03 DIAGNOSIS — K766 Portal hypertension: Secondary | ICD-10-CM

## 2022-05-03 DIAGNOSIS — D696 Thrombocytopenia, unspecified: Secondary | ICD-10-CM

## 2022-05-03 DIAGNOSIS — D729 Disorder of white blood cells, unspecified: Secondary | ICD-10-CM

## 2022-05-03 LAB — CBC WITH DIFFERENTIAL/PLATELET
Basophils Absolute: 0.1 10*3/uL (ref 0.0–0.1)
Basophils Relative: 1.4 % (ref 0.0–3.0)
Eosinophils Absolute: 0.1 10*3/uL (ref 0.0–0.7)
Eosinophils Relative: 1.4 % (ref 0.0–5.0)
HCT: 19.7 % — CL (ref 39.0–52.0)
Hemoglobin: 5.7 g/dL — CL (ref 13.0–17.0)
Lymphocytes Relative: 17.6 % (ref 12.0–46.0)
Lymphs Abs: 0.8 10*3/uL (ref 0.7–4.0)
MCHC: 28.7 g/dL — ABNORMAL LOW (ref 30.0–36.0)
MCV: 66.3 fl — ABNORMAL LOW (ref 78.0–100.0)
Monocytes Absolute: 0.9 10*3/uL (ref 0.1–1.0)
Monocytes Relative: 21.1 % — ABNORMAL HIGH (ref 3.0–12.0)
Neutro Abs: 2.6 10*3/uL (ref 1.4–7.7)
Neutrophils Relative %: 58.5 % (ref 43.0–77.0)
Platelets: 317 10*3/uL (ref 150.0–400.0)
RBC: 2.98 Mil/uL — ABNORMAL LOW (ref 4.22–5.81)
RDW: 38.7 % — ABNORMAL HIGH (ref 11.5–15.5)
WBC: 4.4 10*3/uL (ref 4.0–10.5)

## 2022-05-03 LAB — COMPREHENSIVE METABOLIC PANEL
ALT: 12 U/L (ref 0–53)
AST: 36 U/L (ref 0–37)
Albumin: 4.2 g/dL (ref 3.5–5.2)
Alkaline Phosphatase: 129 U/L — ABNORMAL HIGH (ref 39–117)
BUN: 6 mg/dL (ref 6–23)
CO2: 24 mEq/L (ref 19–32)
Calcium: 8.2 mg/dL — ABNORMAL LOW (ref 8.4–10.5)
Chloride: 103 mEq/L (ref 96–112)
Creatinine, Ser: 0.56 mg/dL (ref 0.40–1.50)
GFR: 115.78 mL/min (ref >60.00)
Glucose, Bld: 208 mg/dL — ABNORMAL HIGH (ref 70–99)
Potassium: 3.9 mEq/L (ref 3.5–5.1)
Sodium: 138 mEq/L (ref 135–145)
Total Bilirubin: 3.7 mg/dL — ABNORMAL HIGH (ref 0.2–1.2)
Total Protein: 8.8 g/dL — ABNORMAL HIGH (ref 6.0–8.3)

## 2022-05-03 LAB — PROTIME-INR
INR: 1.5 ratio — ABNORMAL HIGH (ref 0.8–1.0)
Prothrombin Time: 15.9 s — ABNORMAL HIGH (ref 9.6–13.1)

## 2022-05-03 LAB — AMMONIA: Ammonia: 126 umol/L — ABNORMAL HIGH (ref 11–35)

## 2022-05-03 MED ORDER — PREDNISONE 50 MG PO TABS
ORAL_TABLET | ORAL | 0 refills | Status: DC
  Filled 2022-05-03: qty 3, 2d supply, fill #0

## 2022-05-03 MED ORDER — PANTOPRAZOLE SODIUM 40 MG PO TBEC
40.0000 mg | DELAYED_RELEASE_TABLET | Freq: Every day | ORAL | 3 refills | Status: DC
  Filled 2022-05-03: qty 30, 30d supply, fill #0

## 2022-05-03 NOTE — Telephone Encounter (Signed)
Per Vicie Mutters, PA-C, critical hemglobin of 5.7 and hematocrit of 19.7 was called on patient. She has requested that patient be contacted to be advised to go immediately to the emergency room. I have attempted to reach patient on his phone number listed in chart and have also attempted to reach his emergency contact, both of whom did not answer. I have left messages on both the patients phone as well as the emergency contact phone to call back ASAP. I will also attempt to reach patient again soon.

## 2022-05-03 NOTE — Patient Instructions (Addendum)
You have been scheduled for a CT scan of the abdomen and pancreas at [facility], 1st floor Radiology. You are scheduled on Wednesday 05/08/22 at 8:00 am. You should arrive 15 minutes prior to your appointment time for registration.    Please follow the written instructions below on the day of your exam:   1) Do not eat anything after 4:00 am (4 hours prior to your test)   You may take any medications as prescribed with a small amount of water, if necessary. If you take any of the following medications: METFORMIN, GLUCOPHAGE, GLUCOVANCE, AVANDAMET, RIOMET, FORTAMET, [place] MET, JANUMET, GLUMETZA or METAGLIP, you MAY be asked to HOLD this medication 48 hours AFTER the exam.   The purpose of you drinking the oral contrast is to aid in the visualization of your intestinal tract. The contrast solution may cause some diarrhea. Depending on your individual set of symptoms, you may also receive an intravenous injection of x-ray contrast/dye. Plan on being at [facility] for 45 minutes or longer, depending on the type of exam you are having performed.   If you have any questions regarding your exam or if you need to reschedule, you may call [facility] at [phone] between the hours of 8:00 am and 5:00 pm, Monday-Friday.  ________________________________________________________  Your records indicate that you have an allergy/sensitivity to one or more components within IV contrast dye. We have sent a prescription of Prednisone 50 mg (3 tablets) and Benadryl 50 mg (1 tablet) to your pharmacy as a pre-mediation for your contrasted procedure which should prevent any reaction from occurring.  Take (1) 50 mg tablet of prednisone 13 hours prior to your procedure at 7:00 pm 05/07/22.   Take (1) 50 mg tablet of prednisone 7 hours prior to your procedure at 1:00 am on 05/08/22.    Take (1) 50 mg tablet of prednisone and (1) 50 mg tablet of Benadryl 1 hour prior to your procedure at 7:00 am on  05/08/22.    ______________________________________________________  Your provider has requested that you go to the basement level for lab work before leaving today. Press "B" on the elevator. The lab is located at the first door on the left as you exit the elevator. Your provider has requested that you have an aCHEST x ray before leaving today. Please go to the basement floor to our Radiology department for the test.   FOLLOW UP WITH PRIMARY CARE ABOUT YOUR SHORTNESS OF BREATH  Go to the ER if you have any severe weakness, severe AB pain, vomit blood, dark red blood in your bowel movement, shortenss of breath or chest pain.   STAY ON A MULTIVITAMIN  TAKE THE PROTONIX DAILY, ONCE A DAY  Behavioral Health Resources in the [facility]   Intensive Outpatient Programs:  [facility]  [address], [place]  Both a day and evening program  [facility]  [address]  [place], [place] [address]  [phone]  [facility]  [address]  [place]  [phone]    Residential Treatment Programs:  [facility]  [place]  [address] [place]  [place], [place] [address]  [phone]  [place]  [address] [place], [place] [address]  [phone]  Admissions: 8am-3pm M-F  [facility]  [address] [place], [place] [address]  [phone]  [facility]  [address] Jadene Pierini  [place], [place]  [facility]  [address]  [place], [place]  [facility] - Intensive Outpatient  [address] [place] [address]  [place], [place]  [facility] ([place]  [place], [place] or [phone]  Residential Treatment Services (RTS), Medicaid   [place], [place]  [facility] [address]  [address]  [place]  [place]: [phone] or [phone]  [address], [place] [address]  [email or web address]   Substance Abuse Resources:  [facility] [place] [phone]  [place]  Chinita Pester [phone]  Residential & Outpatient Substance Abuse Program [phone]  Psychological Services:  [place] [phone]  [place], [place] [address], [place], [place] LINE: [phone] or [phone], [email or web address]  Mobile Crisis Teams:  [facility]  Mobile Crisis Care Unit  [phone]  Assertive  Psychotherapeutic Services  [address] [place]  [phone]   Self-Help/Support Groups:  [facility] [employer] [employer] of support groups  [phone] (call for more info)  Narcotics Anonymous (NA)  [facility]  [address]  [place] - 2 meetings at this location   TYLENOL ([place]) IXL: You can take tylenol (acetaminophen) up to 2,000 mg/day. This would be four extra strength (59m) tablets over 24 hours OR six regular strength (325 mg) tablets over 24 hours. Please be sure to read the ingredients of over the counter medications and prescription pain medications as many contain acetaminophen.   NO NSAIDS (ibuprofen, advil, naproxen, aleve, motrin...)  HEPATIC ENCEPHALOPATHY HEPATIC ENCEPHALOPATHY: Confusion caused by a build up of toxins in the blood due to the liver not being able to filter toxins. This can cause confusion mild or severe, increase falls. If you have been diagnosed with Hepatic encephalopathy, advised to not drive due to increased risk   LACTULOSE:  helps pull ammonia and other  toxins from the blood into your stool when you have a bowel movement NO NEED TO CHECK AMMONIA LEVEL IN BLOOD! Only need to check if you are having symptoms. 30-434mup to four times a day Take a dose in the morning If by lunch time you have not had AT LEAST 2 bowel movements take another dose If by dinner you have not had AT LEAST 2 bowel movements that day take another dose If by bedtime you still have not had 2 bowel movements take another dose Goal of 3-4 bowel movements per day Avoid taking with food as this will cause more gas  IF YOU ARE VERY SLEEPY, HARD FOR YOUR FAMILY TO WAKE YOU UP, OR FALLING ASLEEP DURING CONVERSATIONS -INCREASE LACTULOSE/GO TO THE EMERGENCY ROOM  XIFAXAN (rifaximin) An antibiotic that helps limit excess bacteria in the intestine.  The bacteria can cause increased ammonia One 55095mablet twice daily  SUPPLEMENTS: multivitamin Zinc Branch Chain Amino acids (BCAA): 12 grams/day L-Ornithine L-Aspartate (LOLA): 6 grams three times/day (htthttp://cohen-[email or web address]/-Carnitine  PHYSICAL ACTIVITY It is important to continue to be active when you have cirrhosis. Exercise will help reduce muscle loss and weakness.  DISCUSS REFERRAL FOR PHYSICAL THERAPY WITH YOUR PRIMARY CARE PROVIDER  DIET/NUTRITION FOR CIRRHOSIS NO ALCOHOL YOUR GOALS Evening snack - high protein Supplements  between meals to help meet calorie and protein goal: Boost Ensure Premier Protein Shakes Protein Greek yogurt Fish, chicken (NO RAW OR UNDERCOOKED FISH/SHELLFISH) Avoid pork and red meat Plant based protein (non-soy)/Vegan: Lentils, Chickpeas, Peanuts (non salted), almonds (non salted), quinoa, chia seeds  Plant based protein supplements (not soy)  Avoid/limit animal based protein supplements: whey, casein 4.  Low sodium (2,000 mg/day) A. Avoid: table salt, canned foods, deli meats, sausages, hot dogs, anything with a long shelf life B. Read nutrition labels and be aware of  serving size. Don't go by percent of daily value.

## 2022-05-03 NOTE — Telephone Encounter (Signed)
I also reached the patient and advised him to go to the ER. Discussed current hemoglobin 5.7 and with his symptoms of shortness of breath he needed to go. States he will call an ambulance to go to the ER due to his breathing. 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

## 2022-05-03 NOTE — Telephone Encounter (Signed)
Patient called back and I advised patient on Ms Estill Bamberg Collier's recommendation. Patient states he will go to the emergency room as soon as possible.

## 2022-05-03 NOTE — Progress Notes (Unsigned)
p 

## 2022-05-04 LAB — PRO B NATRIURETIC PEPTIDE: NT-Pro BNP: <36 pg/mL (ref 0–121)

## 2022-05-05 ENCOUNTER — Inpatient Hospital Stay (HOSPITAL_COMMUNITY)
Admission: EM | Admit: 2022-05-05 | Discharge: 2022-05-12 | DRG: 812 | Disposition: A | Discharge status: Home / Self Care | Payer: 59 | Attending: Internal Medicine | Admitting: Internal Medicine

## 2022-05-05 ENCOUNTER — Other Ambulatory Visit: Payer: Self-pay

## 2022-05-05 ENCOUNTER — Encounter (HOSPITAL_COMMUNITY): Payer: Self-pay

## 2022-05-05 DIAGNOSIS — K7030 Alcoholic cirrhosis of liver without ascites: Secondary | ICD-10-CM | POA: Diagnosis not present

## 2022-05-05 DIAGNOSIS — D6959 Other secondary thrombocytopenia: Secondary | ICD-10-CM | POA: Diagnosis present

## 2022-05-05 DIAGNOSIS — D649 Anemia, unspecified: Principal | ICD-10-CM | POA: Diagnosis present

## 2022-05-05 DIAGNOSIS — E876 Hypokalemia: Secondary | ICD-10-CM | POA: Diagnosis present

## 2022-05-05 DIAGNOSIS — Z91128 Patient's intentional underdosing of medication regimen for other reason: Secondary | ICD-10-CM

## 2022-05-05 DIAGNOSIS — K7682 Hepatic encephalopathy: Secondary | ICD-10-CM | POA: Diagnosis present

## 2022-05-05 DIAGNOSIS — I8510 Secondary esophageal varices without bleeding: Secondary | ICD-10-CM | POA: Diagnosis present

## 2022-05-05 DIAGNOSIS — R569 Unspecified convulsions: Secondary | ICD-10-CM

## 2022-05-05 DIAGNOSIS — I10 Essential (primary) hypertension: Secondary | ICD-10-CM | POA: Diagnosis present

## 2022-05-05 DIAGNOSIS — R278 Other lack of coordination: Secondary | ICD-10-CM | POA: Diagnosis present

## 2022-05-05 DIAGNOSIS — K219 Gastro-esophageal reflux disease without esophagitis: Secondary | ICD-10-CM | POA: Diagnosis present

## 2022-05-05 DIAGNOSIS — T473X6A Underdosing of saline and osmotic laxatives, initial encounter: Secondary | ICD-10-CM | POA: Diagnosis present

## 2022-05-05 DIAGNOSIS — Z833 Family history of diabetes mellitus: Secondary | ICD-10-CM

## 2022-05-05 DIAGNOSIS — F1010 Alcohol abuse, uncomplicated: Secondary | ICD-10-CM | POA: Diagnosis present

## 2022-05-05 DIAGNOSIS — F1029 Alcohol dependence with unspecified alcohol-induced disorder: Secondary | ICD-10-CM | POA: Diagnosis present

## 2022-05-05 DIAGNOSIS — K3189 Other diseases of stomach and duodenum: Secondary | ICD-10-CM | POA: Diagnosis present

## 2022-05-05 DIAGNOSIS — Z6823 Body mass index (BMI) 23.0-23.9, adult: Secondary | ICD-10-CM

## 2022-05-05 DIAGNOSIS — K920 Hematemesis: Secondary | ICD-10-CM | POA: Diagnosis present

## 2022-05-05 DIAGNOSIS — K8689 Other specified diseases of pancreas: Secondary | ICD-10-CM | POA: Diagnosis present

## 2022-05-05 DIAGNOSIS — E1165 Type 2 diabetes mellitus with hyperglycemia: Secondary | ICD-10-CM | POA: Diagnosis present

## 2022-05-05 DIAGNOSIS — D62 Acute posthemorrhagic anemia: Secondary | ICD-10-CM | POA: Diagnosis not present

## 2022-05-05 DIAGNOSIS — E440 Moderate protein-calorie malnutrition: Secondary | ICD-10-CM | POA: Insufficient documentation

## 2022-05-05 DIAGNOSIS — Z7984 Long term (current) use of oral hypoglycemic drugs: Secondary | ICD-10-CM

## 2022-05-05 DIAGNOSIS — G40909 Epilepsy, unspecified, not intractable, without status epilepticus: Secondary | ICD-10-CM

## 2022-05-05 DIAGNOSIS — K922 Gastrointestinal hemorrhage, unspecified: Secondary | ICD-10-CM

## 2022-05-05 DIAGNOSIS — D509 Iron deficiency anemia, unspecified: Secondary | ICD-10-CM | POA: Diagnosis present

## 2022-05-05 DIAGNOSIS — K766 Portal hypertension: Secondary | ICD-10-CM | POA: Diagnosis present

## 2022-05-05 DIAGNOSIS — K862 Cyst of pancreas: Secondary | ICD-10-CM | POA: Diagnosis present

## 2022-05-05 DIAGNOSIS — E875 Hyperkalemia: Secondary | ICD-10-CM | POA: Diagnosis present

## 2022-05-05 DIAGNOSIS — E1169 Type 2 diabetes mellitus with other specified complication: Secondary | ICD-10-CM

## 2022-05-05 DIAGNOSIS — Z515 Encounter for palliative care: Secondary | ICD-10-CM

## 2022-05-05 DIAGNOSIS — I8500 Esophageal varices without bleeding: Secondary | ICD-10-CM | POA: Diagnosis present

## 2022-05-05 DIAGNOSIS — Z87891 Personal history of nicotine dependence: Secondary | ICD-10-CM

## 2022-05-05 DIAGNOSIS — D124 Benign neoplasm of descending colon: Secondary | ICD-10-CM

## 2022-05-05 DIAGNOSIS — K269 Duodenal ulcer, unspecified as acute or chronic, without hemorrhage or perforation: Secondary | ICD-10-CM | POA: Diagnosis not present

## 2022-05-05 DIAGNOSIS — K259 Gastric ulcer, unspecified as acute or chronic, without hemorrhage or perforation: Secondary | ICD-10-CM | POA: Diagnosis not present

## 2022-05-05 DIAGNOSIS — E119 Type 2 diabetes mellitus without complications: Secondary | ICD-10-CM

## 2022-05-05 DIAGNOSIS — Y92009 Unspecified place in unspecified non-institutional (private) residence as the place of occurrence of the external cause: Secondary | ICD-10-CM

## 2022-05-05 DIAGNOSIS — Z79899 Other long term (current) drug therapy: Secondary | ICD-10-CM

## 2022-05-05 DIAGNOSIS — E785 Hyperlipidemia, unspecified: Secondary | ICD-10-CM | POA: Diagnosis present

## 2022-05-05 DIAGNOSIS — K7031 Alcoholic cirrhosis of liver with ascites: Secondary | ICD-10-CM | POA: Diagnosis present

## 2022-05-05 DIAGNOSIS — E871 Hypo-osmolality and hyponatremia: Secondary | ICD-10-CM | POA: Diagnosis present

## 2022-05-05 LAB — BASIC METABOLIC PANEL
Anion gap: 11 (ref 5–15)
BUN: 9 mg/dL (ref 6–20)
CO2: 22 mmol/L (ref 22–32)
Calcium: 8.2 mg/dL — ABNORMAL LOW (ref 8.9–10.3)
Chloride: 105 mmol/L (ref 98–111)
Creatinine, Ser: 0.51 mg/dL — ABNORMAL LOW (ref 0.61–1.24)
GFR, Estimated: >60 mL/min (ref >60)
Glucose, Bld: 272 mg/dL — ABNORMAL HIGH (ref 70–99)
Potassium: 3.4 mmol/L — ABNORMAL LOW (ref 3.5–5.1)
Sodium: 138 mmol/L (ref 135–145)

## 2022-05-05 LAB — HEPATIC FUNCTION PANEL
ALT: 14 U/L (ref 0–44)
AST: 41 U/L (ref 15–41)
Albumin: 3.4 g/dL — ABNORMAL LOW (ref 3.5–5.0)
Alkaline Phosphatase: 112 U/L (ref 38–126)
Bilirubin, Direct: 2.1 mg/dL — ABNORMAL HIGH (ref 0.0–0.2)
Indirect Bilirubin: 1.4 mg/dL — ABNORMAL HIGH (ref 0.3–0.9)
Total Bilirubin: 3.5 mg/dL — ABNORMAL HIGH (ref 0.3–1.2)
Total Protein: 8.4 g/dL — ABNORMAL HIGH (ref 6.5–8.1)

## 2022-05-05 LAB — CBC WITH DIFFERENTIAL/PLATELET
Abs Immature Granulocytes: 0.01 10*3/uL (ref 0.00–0.07)
Basophils Absolute: 0.1 10*3/uL (ref 0.0–0.1)
Basophils Relative: 1 %
Eosinophils Absolute: 0 10*3/uL (ref 0.0–0.5)
Eosinophils Relative: 1 %
HCT: 17.5 % — ABNORMAL LOW (ref 39.0–52.0)
Hemoglobin: 5 g/dL — CL (ref 13.0–17.0)
Immature Granulocytes: 0 %
Lymphocytes Relative: 37 %
Lymphs Abs: 1.9 10*3/uL (ref 0.7–4.0)
MCH: 19.4 pg — ABNORMAL LOW (ref 26.0–34.0)
MCHC: 28.6 g/dL — ABNORMAL LOW (ref 30.0–36.0)
MCV: 67.8 fL — ABNORMAL LOW (ref 80.0–100.0)
Monocytes Absolute: 0.6 10*3/uL (ref 0.1–1.0)
Monocytes Relative: 11 %
Neutro Abs: 2.6 10*3/uL (ref 1.7–7.7)
Neutrophils Relative %: 50 %
Platelets: 223 10*3/uL (ref 150–400)
RBC: 2.58 MIL/uL — ABNORMAL LOW (ref 4.22–5.81)
RDW: 33.3 % — ABNORMAL HIGH (ref 11.5–15.5)
WBC: 5.1 10*3/uL (ref 4.0–10.5)
nRBC: 0.4 % — ABNORMAL HIGH (ref 0.0–0.2)

## 2022-05-05 LAB — POC OCCULT BLOOD, ED: Fecal Occult Bld: NEGATIVE

## 2022-05-05 MED ORDER — THIAMINE HCL 100 MG/ML IJ SOLN: 100.0000 mg | Freq: Every day | INTRAMUSCULAR | Status: DC

## 2022-05-05 MED ORDER — POTASSIUM CHLORIDE 10 MEQ/100ML IV SOLN
10.0000 meq | INTRAVENOUS | Status: AC
  Administered 2022-05-06 (×2): 10 meq via INTRAVENOUS
  Filled 2022-05-05 (×2): qty 100

## 2022-05-05 MED ORDER — SODIUM CHLORIDE 0.9 % IV SOLN
10.0000 mL/h | Freq: Once | INTRAVENOUS | Status: DC
Start: 2022-05-05 — End: 2022-05-12

## 2022-05-05 MED ORDER — FOLIC ACID 1 MG PO TABS
1.0000 mg | ORAL_TABLET | Freq: Every day | ORAL | Status: DC
  Administered 2022-05-06 – 2022-05-12 (×6): 1 mg via ORAL
  Filled 2022-05-05 (×6): qty 1

## 2022-05-05 MED ORDER — SODIUM CHLORIDE 0.9% IV SOLUTION
Freq: Once | INTRAVENOUS | Status: AC
  Administered 2022-05-06: 10 mL/h via INTRAVENOUS

## 2022-05-05 MED ORDER — INSULIN ASPART 100 UNIT/ML IJ SOLN
0.0000 [IU] | INTRAMUSCULAR | Status: DC
  Administered 2022-05-06: 5 [IU] via SUBCUTANEOUS
  Administered 2022-05-06: 3 [IU] via SUBCUTANEOUS
  Administered 2022-05-06: 1 [IU] via SUBCUTANEOUS
  Administered 2022-05-06 (×2): 3 [IU] via SUBCUTANEOUS
  Administered 2022-05-07: 2 [IU] via SUBCUTANEOUS
  Administered 2022-05-07 – 2022-05-08 (×5): 1 [IU] via SUBCUTANEOUS
  Administered 2022-05-08: 2 [IU] via SUBCUTANEOUS
  Administered 2022-05-08: 1 [IU] via SUBCUTANEOUS
  Administered 2022-05-09: 7 [IU] via SUBCUTANEOUS
  Administered 2022-05-09: 2 [IU] via SUBCUTANEOUS
  Administered 2022-05-09: 7 [IU] via SUBCUTANEOUS
  Administered 2022-05-09 – 2022-05-10 (×4): 2 [IU] via SUBCUTANEOUS
  Administered 2022-05-10 (×2): 3 [IU] via SUBCUTANEOUS
  Administered 2022-05-10: 2 [IU] via SUBCUTANEOUS
  Administered 2022-05-11 (×2): 1 [IU] via SUBCUTANEOUS
  Administered 2022-05-11: 3 [IU] via SUBCUTANEOUS
  Administered 2022-05-11: 1 [IU] via SUBCUTANEOUS
  Administered 2022-05-12: 3 [IU] via SUBCUTANEOUS
  Administered 2022-05-12: 2 [IU] via SUBCUTANEOUS
  Filled 2022-05-05: qty 0.09

## 2022-05-05 MED ORDER — LORAZEPAM 2 MG/ML IJ SOLN: 1.0000 mg | INTRAMUSCULAR | Status: AC | PRN

## 2022-05-05 MED ORDER — LORAZEPAM 1 MG PO TABS
1.0000 mg | ORAL_TABLET | ORAL | Status: AC | PRN
  Administered 2022-05-06: 1 mg via ORAL
  Filled 2022-05-05: qty 1

## 2022-05-05 MED ORDER — THIAMINE MONONITRATE 100 MG PO TABS
100.0000 mg | ORAL_TABLET | Freq: Every day | ORAL | Status: DC
  Administered 2022-05-06 – 2022-05-12 (×6): 100 mg via ORAL
  Filled 2022-05-05 (×6): qty 1

## 2022-05-05 MED ORDER — ADULT MULTIVITAMIN W/MINERALS CH
1.0000 | ORAL_TABLET | Freq: Every day | ORAL | Status: DC
  Administered 2022-05-06 – 2022-05-12 (×6): 1 via ORAL
  Filled 2022-05-05 (×6): qty 1

## 2022-05-05 NOTE — Progress Notes (Signed)
Attending Physician's Attestation   I have reviewed the chart.   I agree with the Advanced Practitioner's note, impression, and recommendations with any updates as below. We will see what labs look like, concerning that patient continues to drink alcohol at this point.  Agree with pancreas protocol CT with IV contrast premedication if possible.  Next steps will be based on findings at time of imaging.   Justice Britain, MD [facility] Office # [phone]

## 2022-05-05 NOTE — ED Provider Triage Note (Signed)
Emergency Medicine Provider Triage Evaluation Note  Gary Mitchell , a 51 y.o. male  was evaluated in triage.  Pt complains of SOB x 9 months. Saw PCP two days ago who performed blood work. He was called today and notified that his hemoglobin was low and he needed to go to the ED.  Denies melena, hematochezia, hemoptysis, chest pain, fevers.  Review of Systems  Positive: As above Negative: As above  Physical Exam  BP 136/86   Pulse (!) 119   Temp 98.1 F (36.7 C) (Oral)   Resp 18   SpO2 97%  Gen:   Awake, no distress   Resp:  Normal effort  MSK:   Moves extremities without difficulty  Other:  Tachycardia  Medical Decision Making  Medically screening exam initiated at 9:24 PM.  Appropriate orders placed.  Gary Mitchell was informed that the remainder of the evaluation will be completed by another provider, this initial triage assessment does not replace that evaluation, and the importance of remaining in the ED until their evaluation is complete.  Symptomatic anemia - Hgb 5.7 on 05/03/22. Will repeat labs, add type and screen.   Antonietta Breach, PA-C 05/05/22 2126

## 2022-05-05 NOTE — Assessment & Plan Note (Signed)
Followed by [facility] ER sent msg to let them know he is here

## 2022-05-05 NOTE — ED Triage Notes (Signed)
Pt was called by his physician and told that his hemoglobin was low.

## 2022-05-05 NOTE — Assessment & Plan Note (Signed)
Order CIWA monitor for withdrawal

## 2022-05-05 NOTE — Assessment & Plan Note (Signed)
Continue Keppra '500mg'$  BID Last seizure was few months ago

## 2022-05-05 NOTE — Subjective & Objective (Signed)
Came in for abnormal labs has hx of Cirrhosis  Still drinks  His Hg is 5.7  He denies any bleeding  But feels fatigued

## 2022-05-05 NOTE — H&P (Signed)
Gary Mitchell BMW:[id number] DOB: Nov 07, 1970 DOA: 05/05/2022     PCP: Charlott Rakes, MD   Outpatient Specialists:     GI  Dr.Newlin, [place]     Patient arrived to ER on 05/05/22 at 2117 Referred by Attending Toy Baker, MD   Patient coming from:    home Lives alone,   *** With family    Chief Complaint:   Chief Complaint  Patient presents with   abnormal labs    HPI: Gary Mitchell is a 51 y.o. male with medical history significant of alcoholic cirrhosis, pancreatic mass, GERD, portal HTN w varices, Alcohol abuse   Presented with low hg Came in for abnormal labs has hx of Cirrhosis  Still drinks  His Hg is 5.7  He denies any bleeding  But feels fatigued        Regarding pertinent Chronic problems:     Hyperlipidemia -  on statins Lipitor (atorvastatin)  Lipid Panel     Component Value Date/Time   TRIG 120 07/09/2020 1240  Pancreatic mass-   Last imaging MRI 11/2021 showed pancreatic cystic lesion EUS 01/2022 showed thickening but no masses. 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 Schedule CT pancreatic protocol, has possible IV allergy, sent in prednisone IV allergy protocol pt never started        DM 2 -  Lab Results  Component Value Date   HGBA1C 5.5 11/20/2021   on   PO meds only,         *** OSA -on nocturnal oxygen, *CPAP, *noncompliant with CPAP     Liver disease MELD 3.0: 16 at 05/05/2022 10:04 PM MELD-Na: 16 at 05/05/2022 10:04 PM Calculated from: Serum Creatinine: 0.51 mg/dL (Using min of 1 mg/dL) at 05/05/2022 10:04 PM Serum Sodium: 138 mmol/L (Using max of 137 mmol/L) at 05/05/2022 10:04 PM Total Bilirubin: 3.7 mg/dL at 05/03/2022 10:02 AM Serum Albumin: 4.2 g/dL (Using  max of 3.5 g/dL) at 05/03/2022 10:02 AM INR(ratio): 1.5 ratio at 05/03/2022 10:02 AM Age at listing (hypothetical): 51 years Sex: Male at 05/05/2022 10:04 PM    Chronic anemia - baseline hg Hemoglobin & Hematocrit  Recent Labs    11/19/21 1158 05/03/22 1002 05/05/22 2204  HGB 8.2* 5.7 Repeated and verified X2.* 5.0*     While in ER:   Noted to be anemia down to 5.5 hemoccult neg  Attempted to notify [facility] that pt is being admitted    Following Medications were ordered in ER: Medications  0.9 %  sodium chloride infusion (0 mL/hr Intravenous Hold 05/05/22 2327)  insulin aspart (novoLOG) injection 0-9 Units (has no administration in time range)  0.9 %  sodium chloride infusion (Manually program via Guardrails IV Fluids) (has no administration in time range)  potassium chloride 10 mEq in 100 mL IVPB (has no administration in time range)    ______    ED Triage Vitals  Enc Vitals Group     BP 05/05/22 2123 136/86     Pulse Rate 05/05/22 2123 (!) 119     Resp 05/05/22 2123 18     Temp 05/05/22 2123 98.1 F (36.7 C)     Temp Source 05/05/22 2123 Oral     SpO2 05/05/22 2122 99 %  Weight --      Height --      Head Circumference --      Peak Flow --      Pain Score 05/05/22 2124 3     Pain Loc --      Pain Edu? --      Excl. in [place]? --   TMAX(24)@     _________________________________________ Significant initial  Findings: Abnormal Labs Reviewed  CBC WITH DIFFERENTIAL/PLATELET - Abnormal; Notable for the following components:      Result Value   RBC 2.58 (*)    Hemoglobin 5.0 (*)    HCT 17.5 (*)    MCV 67.8 (*)    MCH 19.4 (*)    MCHC 28.6 (*)    RDW 33.3 (*)    nRBC 0.4 (*)    All other components within normal limits  BASIC METABOLIC PANEL - Abnormal; Notable for the following components:   Potassium 3.4 (*)    Glucose, Bld 272 (*)    Creatinine, Ser 0.51 (*)    Calcium 8.2 (*)    All other components within normal limits     ECG: Ordered Personally  reviewed and interpreted by me showing: HR : 108 Rhythm: Sinus tachycardia QTC 453    The recent clinical data is shown below. Vitals:   05/05/22 2244 05/05/22 2245 05/05/22 2300 05/05/22 2330  BP: 115/84  122/79 114/80  Pulse: (!) 110  (!) 112 (!) 114  Resp: 15  (!) 23 16  Temp:      TempSrc:      SpO2:  98% 97% 100%    WBC     Component Value Date/Time   WBC 5.1 05/05/2022 2204   LYMPHSABS 1.9 05/05/2022 2204   LYMPHSABS 2.2 10/03/2020 1127   MONOABS 0.6 05/05/2022 2204   EOSABS 0.0 05/05/2022 2204   EOSABS 0.1 10/03/2020 1127   BASOSABS 0.1 05/05/2022 2204   BASOSABS 0.2 10/03/2020 1127       _______________________________________________ Hospitalist was called for admission for   Symptomatic anemia  Alcohol dependence with unspecified alcohol-induced disorder     Alcoholic cirrhosis of liver without ascites    The following Work up has been ordered so far:  Orders Placed This Encounter  Procedures   Critical Care   CBC with Differential   Basic metabolic panel   Hepatic function panel   Hemoglobin A1c   Ammonia   CK   Vitamin B12   Folate   Iron and TIBC   Ferritin   Reticulocytes   Lactic acid, plasma   Magnesium   Phosphorus   Prealbumin   Protime-INR   TSH   Urinalysis, Complete w Microscopic   Informed Consent Details: Physician/Practitioner Attestation; Transcribe to consent form and obtain patient signature   Cardiac monitoring   Apply Diabetes Mellitus Care Plan   STAT CBG when hypoglycemia is suspected. If treated, recheck every 15 minutes after each treatment until CBG >/= 70 mg/dl   Refer to Hypoglycemia Protocol Sidebar Report for treatment of CBG < 70 mg/dl   No basal insulin at this time   CBC post transfusion - RN to place lab order with appropriate draw time   Consult to hospitalist   Nutritional services consult   Consult to Transition of Care Team   POC occult blood, ED   EKG 12-Lead   EKG 12-Lead   EKG 12-Lead   Type and  screen [place]   Prepare RBC (crossmatch)  Place in observation (patient's expected length of stay will be less than 2 midnights)     OTHER Significant initial  Findings:  labs showing:    Recent Labs  Lab 05/03/22 1002 05/05/22 2204  NA 138 138  K 3.9 3.4*  CO2 24 22  GLUCOSE 208* 272*  BUN 6 9  CREATININE 0.56 0.51*  CALCIUM 8.2* 8.2*    Cr   stable,    Lab Results  Component Value Date   CREATININE 0.51 (L) 05/05/2022   CREATININE 0.56 05/03/2022   CREATININE 0.48 (L) 11/19/2021    Recent Labs  Lab 05/03/22 1002  AST 36  ALT 12  ALKPHOS 129*  BILITOT 3.7*  PROT 8.8*  ALBUMIN 4.2   Lab Results  Component Value Date   CALCIUM 8.2 (L) 05/05/2022   PHOS 2.9 09/21/2020           Plt: Lab Results  Component Value Date   PLT 223 05/05/2022      COVID-19 Labs  No results for input(s): "DDIMER", "FERRITIN", "LDH", "CRP" in the last 72 hours.  Lab Results  Component Value Date   SARSCOV2NAA POSITIVE (A) 07/09/2020   [place] NEGATIVE 11/23/2019       Recent Labs  Lab 05/03/22 1002 05/05/22 2204  WBC 4.4 5.1  NEUTROABS 2.6 2.6  HGB 5.7 Repeated and verified X2.* 5.0*  HCT 19.7 Repeated and verified X2.* 17.5*  MCV 66.3 Repeated and verified X2.* 67.8*  PLT 317.0 223    HG/HCT  stable,       Component Value Date/Time   HGB 5.0 (LL) 05/05/2022 2204   HGB 10.6 (L) 10/03/2020 1127   HCT 17.5 (L) 05/05/2022 2204   HCT 30.0 (L) 10/03/2020 1127   MCV 67.8 (L) 05/05/2022 2204   MCV 74 (L) 10/03/2020 1127      No results for input(s): "LIPASE", "AMYLASE" in the last 168 hours. Recent Labs  Lab 05/03/22 1002  AMMONIA 126*      Cardiac Panel (last 3 results) No results for input(s): "CKTOTAL", "CKMB", "TROPONINI", "RELINDX" in the last 72 hours.  .car BNP (last 3 results) No results for input(s): "BNP" in the last 8760 hours.    DM  labs:  HbA1C: Recent Labs    05/29/21 1516 08/22/21 1433  11/20/21 1447  HGBA1C 11.1* 6.0 5.5       CBG (last 3)  No results for input(s): "GLUCAP" in the last 72 hours.        Cultures: No results found for: "SDES", "SPECREQUEST", "CULT", "REPTSTATUS"   Radiological Exams on Admission: No results found. _______________________________________________________________________________________________________ Latest  Blood pressure 114/80, pulse (!) 114, temperature 98.1 F (36.7 C), temperature source Oral, resp. rate 16, SpO2 100 %.   Vitals  labs and radiology finding personally reviewed  Review of Systems:    Pertinent positives include:   fatigue,  Constitutional:  No weight loss, night sweats, Fevers, chills, weight loss  HEENT:  No headaches, Difficulty swallowing,Tooth/dental problems,Sore throat,  No sneezing, itching, ear ache, nasal congestion, post nasal drip,  Cardio-vascular:  No chest pain, Orthopnea, PND, anasarca, dizziness, palpitations.no Bilateral lower extremity swelling  GI:  No heartburn, indigestion, abdominal pain, nausea, vomiting, diarrhea, change in bowel habits, loss of appetite, melena, blood in stool, hematemesis Resp:  no shortness of breath at rest. No dyspnea on exertion, No excess mucus, no productive cough, No non-productive cough, No coughing up of blood.No change in color of mucus.No wheezing. Skin:  no rash or lesions.  No jaundice GU:  no dysuria, change in color of urine, no urgency or frequency. No straining to urinate.  No flank pain.  Musculoskeletal:  No joint pain or no joint swelling. No decreased range of motion. No back pain.  Psych:  No change in mood or affect. No depression or anxiety. No memory loss.  Neuro: no localizing neurological complaints, no tingling, no weakness, no double vision, no gait abnormality, no slurred speech, no confusion  All systems reviewed and apart from Chloride all are  negative _______________________________________________________________________________________________ Past Medical History:   Past Medical History:  Diagnosis Date   Alcoholic hepatitis 98/1191   Cirrhosis with alcoholism ([place]) 11/2019   Coagulopathy ([place])    Attributed to liver disease/cirrhosis   Pancreatic lesion 11/2019   Initially concerning for neoplasm but improved appearance on MRI 12/2019 at which time pseudocyst was most likely diagnosis.   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   Seizures ([place])    Thrombocytopenia ([place])       Past Surgical History:  Procedure Laterality Date   BIOPSY  08/02/2021   Procedure: BIOPSY;  Surgeon: Milus Banister, MD;  Location: [facility];  Service: Endoscopy;;   ENTEROSCOPY N/A 09/20/2020   Procedure: ENTEROSCOPY;  Surgeon: [place], MD;  Location: [place];  Service: Gastroenterology;  Laterality: N/A;   ESOPHAGOGASTRODUODENOSCOPY (EGD) WITH PROPOFOL N/A 08/02/2021   Procedure: ESOPHAGOGASTRODUODENOSCOPY (EGD) WITH PROPOFOL;  Surgeon: Milus Banister, MD;  Location: [facility];  Service: Endoscopy;  Laterality: N/A;   ESOPHAGOGASTRODUODENOSCOPY (EGD) WITH PROPOFOL N/A 01/31/2022   Procedure: ESOPHAGOGASTRODUODENOSCOPY (EGD) WITH PROPOFOL;  Surgeon: Milus Banister, MD;  Location: [facility];  Service: Gastroenterology;  Laterality: N/A;   EUS N/A 01/31/2022   Procedure: UPPER ENDOSCOPIC ULTRASOUND (EUS) RADIAL;  Surgeon: Milus Banister, MD;  Location: [facility];  Service: Gastroenterology;  Laterality: N/A;   FINE NEEDLE ASPIRATION N/A 08/02/2021   Procedure: FINE NEEDLE ASPIRATION (FNA) LINEAR;  Surgeon: Milus Banister, MD;  Location: [facility];  Service: Endoscopy;  Laterality: N/A;   FINE NEEDLE ASPIRATION N/A 01/31/2022   Procedure: FINE NEEDLE ASPIRATION (FNA) LINEAR;  Surgeon: Milus Banister, MD;  Location: [facility];  Service:  Gastroenterology;  Laterality: N/A;   left kidney removed     UPPER ESOPHAGEAL ENDOSCOPIC ULTRASOUND (EUS) N/A 08/02/2021   Procedure: UPPER ESOPHAGEAL ENDOSCOPIC ULTRASOUND (EUS);  Surgeon: Milus Banister, MD;  Location: [facility];  Service: Endoscopy;  Laterality: N/A;  Radial and Linear    Social History:  Ambulatory *** independently cane, walker  wheelchair bound, bed bound     reports that he quit smoking about 21 months ago. His smoking use included cigarettes. He has a 2.50 pack-year smoking history. He has never used smokeless tobacco. He reports that he does not currently use alcohol after a past usage of about 3.0 standard drinks of alcohol per week. He reports that he does not currently use drugs after having used the following drugs: Marijuana.     Family History:  Family History  Problem Relation Age of Onset   Diabetes Mellitus II Mother    Colon cancer Neg Hx    Esophageal cancer Neg Hx    ______________________________________________________________________________________________ Allergies: Allergies  Allergen Reactions   Iohexol Other (See Comments)    Unknown reaction at 18 days old Mom at bedside reported patient was given injections of iohexol in foot and resulted in "  hole in foot"     Prior to Admission medications   Medication Sig Start Date End Date Taking? Authorizing Provider  gabapentin (NEURONTIN) 300 MG capsule Take 1 capsule (300 mg total) by mouth at bedtime. 11/20/21  Yes Charlott Rakes, MD  levETIRAcetam (KEPPRA) 500 MG tablet Take 1 tablet (500 mg total) by mouth 2 (two) times daily. 11/20/21 06/02/22 Yes Charlott Rakes, MD  metFORMIN (GLUCOPHAGE) 500 MG tablet Take 2 tablets (1,000 mg total) by mouth 2 (two) times daily with a meal. 11/20/21  Yes Charlott Rakes, MD  Multiple Vitamin (MULTIVITAMIN) tablet Take 1 tablet by mouth at bedtime.   Yes [provider]  pantoprazole (PROTONIX) 40 MG tablet Take 1 tablet (40 mg total) by  mouth daily. 05/03/22  Yes Vladimir Crofts, PA-C  Potassium 99 MG TABS Take 1 tablet by mouth daily as needed (cramps).   Yes [provider]  atorvastatin (LIPITOR) 20 MG tablet Take 1 tablet (20 mg total) by mouth daily. 05/29/21   Charlott Rakes, MD  ciprofloxacin (CIPRO) 500 MG tablet Take 1 tablet (500 mg total) by mouth 2 (two) times daily. Patient not taking: Reported on 05/05/2022 01/31/22   Milus Banister, MD  lactulose, encephalopathy, (CHRONULAC) 10 GM/15ML SOLN Take 30 mLs (20 g total) by mouth daily. Patient not taking: Reported on 05/05/2022 06/21/21   Levin Erp, PA  naltrexone (DEPADE) 50 MG tablet Take 1 tablet (50 mg total) by mouth daily. Patient not taking: Reported on 05/05/2022 10/03/20   Charlott Rakes, MD  potassium chloride SA (KLOR-CON M) 20 MEQ tablet Take 1 tablet (20 mEq total) by mouth daily. Patient not taking: Reported on 05/05/2022 05/07/21   Petrucelli, Glynda Jaeger, PA-C  predniSONE (DELTASONE) 50 MG tablet Give 13 hours, 7 hours, and 1 hour prior to contrast dye injection.  Notify pharmacy to schedule dose as appropriate based on procedure date/time. 05/03/22   Vladimir Crofts, PA-C    ___________________________________________________________________________________________________ Physical Exam:    05/05/2022   11:30 PM 05/05/2022   11:00 PM 05/05/2022   10:44 PM  Vitals with BMI  Systolic 712 458 099  Diastolic 80 79 84  Pulse 833 112 110     1. General:  in No  Acute distress    Chronically ill   -appearing 2. Psychological: Alert and   Oriented 3. Head/ENT:  Dry Mucous Membranes                          Head Non traumatic, neck supple                         Poor Dentition 4. SKIN decreased Skin turgor,  Skin clean Dry and intact no rash 5. Heart: Regular rate and rhythm no*** Murmur, no Rub or gallop 6. Lungs: ***Clear to auscultation bilaterally, no wheezes or crackles   7. Abdomen: Soft, ***non-tender, Non  distended *** obese ***bowel sounds present 8. Lower extremities: no clubbing, cyanosis, no ***edema 9. Neurologically Grossly intact, moving all 4 extremities equally *** strength 5 out of 5 in all 4 extremities cranial nerves II through XII intact 10. MSK: Normal range of motion    Chart has been reviewed  ______________________________________________________________________________________________  Assessment/Plan  51 y.o. male with medical history significant of alcoholic cirrhosis, pancreatic mass, GERD, portal HTN w varices, Alcohol abuse   Admitted for   Symptomatic anemia  Alcohol dependence with unspecified alcohol-induced disorder  Alcoholic cirrhosis of liver without ascites     Present on Admission:  Symptomatic anemia  Alcoholic cirrhosis of liver without ascites (HCC)  GERD (gastroesophageal reflux disease)  Hypokalemia  Esophageal varices without bleeding (HCC)   Seizure (HCC) Continue Keppra '500mg'$  BID  Alcoholic cirrhosis of liver without ascites (HCC) Followed by [facility] ER sent msg to let them know he is here  GERD (gastroesophageal reflux disease) Cont protonix 40 mg po q day  Type 2 diabetes mellitus (HCC)  - Order Sensitive  SSI     -  check TSH and HgA1C  - Hold by mouth medications     Symptomatic anemia obtain anemia panel Transfuse 2 units and follow CBC  Hemoccult negative [facility] is aware  Hypokalemia - will replace and repeat in AM,  check magnesium level and replace as needed   Esophageal varices without bleeding (HCC) Chronic stable   Other plan as per orders.   DVT prophylaxis:  SCD       Code Status:    Code Status: Prior FULL CODE *** DNR/DNI ***comfort care as per patient ***family  I had personally discussed CODE STATUS with patient and family* I had spent *min discussing goals of care and CODE STATUS    Family Communication:   Family not at  Bedside    Disposition Plan:            To home once workup is complete and  patient is stable   Following barriers for discharge:                            Electrolytes corrected                               Anemia corrected                                                       Will need consultants to evaluate patient prior to discharge                        Would benefit from PT/OT eval prior to DC  Ordered                                      Transition of care consulted                                Consults called: er consulted [facility]  Admission status:  ED Disposition     ED Disposition  [place]: [place] [[id number]]  Level of Care: Progressive [102]  Admit to Progressive based on following criteria: GI, ENDOCRINE disease patients with GI bleeding, acute liver failure or pancreatitis, stable with diabetic ketoacidosis or thyrotoxicosis (hypothyroid) state.  May place patient in observation at [facility] or [place] if equivalent level of care is available:: No  Covid Evaluation: Asymptomatic - no recent exposure (last 10 days) testing not required  Diagnosis: Symptomatic anemia [[id number]]  Admitting Physician:  Toy Baker [[id number]]  Attending Physician: Toy Baker [[id number]]          Obs     Level of care        progressive tele indefinitely please discontinue once patient no longer qualifies COVID-19 Labs   Giani Betzold 05/05/2022, 11:59 PM ***  [facility]     after 2 AM please page floor coverage PA If 7AM-7PM, please contact the day team taking care of the patient using [email or web address]   Patient was evaluated in the context of the global COVID-19 pandemic, which necessitated consideration that the patient might be at risk for infection with the SARS-CoV-2 virus that causes COVID-19. Institutional protocols and algorithms that pertain to the evaluation of patients at risk for COVID-19 are in a state of rapid change based on information released by  regulatory bodies including the CDC and federal and state organizations. These policies and algorithms were followed during the patient's care.

## 2022-05-05 NOTE — Assessment & Plan Note (Signed)
conitnue lipitor at '20mg'$  po po qday

## 2022-05-05 NOTE — Assessment & Plan Note (Signed)
Chronic-stable.

## 2022-05-05 NOTE — Assessment & Plan Note (Signed)
-   Order Sensitive  SSI     -  check TSH and HgA1C  - Hold by mouth medications*  

## 2022-05-05 NOTE — Assessment & Plan Note (Signed)
obtain anemia panel Transfuse 2 units and follow CBC  Hemoccult negative [facility] is aware

## 2022-05-05 NOTE — Assessment & Plan Note (Signed)
Cont protonix 40 mg po q day

## 2022-05-05 NOTE — Assessment & Plan Note (Signed)
Need to continue follow up with GI, pt needs premedicating for CT vs may do better with MRI defer to [facility]

## 2022-05-05 NOTE — Assessment & Plan Note (Signed)
-   will replace and repeat in AM,  check magnesium level and replace as needed ° °

## 2022-05-05 NOTE — ED Provider Notes (Signed)
[facility] DEPT Provider Note   CSN: [id number] Arrival date & time: 05/05/22  2117    History  Chief Complaint  Patient presents with   abnormal labs    Gary Mitchell is a 51 y.o. male history of GERD, pancreatic lesion, type 2 diabetes, alcoholic cirrhosis followed by [facility] here for evaluation of anemia.  History of similar however has been multiple years.  States has been transfused previously however this was long time ago.  Seen by [facility] in clinic 2 days ago.  Noted to have some shortness of breath, fatigue and weakness.  They obtained labs which showed a hemoglobin of 5.7.  Denies any melena or bright red per rectum.  Has known history esophageal varices with mild portal gastropathy on EGD in July however he denies any hematemesis.  States he still continues to drink alcohol, last use 2 days ago.  He is unable to quantify how much.  Denies history of DTs, withdrawal seizures.  Has some mild jaundice on exam however states this is chronic.  History of prior hepatic encephalopathy however family does not note any altered mental status on exam.  He was sent here for transfusion.  No fever, headache, nausea, vomiting, chest pain, abd pain, melena, blood per rectum, dysuria.  HPI     Home Medications Prior to Admission medications   Medication Sig Start Date End Date Taking? Authorizing Provider  gabapentin (NEURONTIN) 300 MG capsule Take 1 capsule (300 mg total) by mouth at bedtime. 11/20/21  Yes Charlott Rakes, MD  levETIRAcetam (KEPPRA) 500 MG tablet Take 1 tablet (500 mg total) by mouth 2 (two) times daily. 11/20/21 06/02/22 Yes Charlott Rakes, MD  metFORMIN (GLUCOPHAGE) 500 MG tablet Take 2 tablets (1,000 mg total) by mouth 2 (two) times daily with a meal. 11/20/21  Yes Charlott Rakes, MD  Multiple Vitamin (MULTIVITAMIN) tablet Take 1 tablet by mouth at bedtime.   Yes [provider]  pantoprazole (PROTONIX) 40 MG tablet Take 1  tablet (40 mg total) by mouth daily. 05/03/22  Yes Vladimir Crofts, PA-C  Potassium 99 MG TABS Take 1 tablet by mouth daily as needed (cramps).   Yes [provider]  atorvastatin (LIPITOR) 20 MG tablet Take 1 tablet (20 mg total) by mouth daily. 05/29/21   Charlott Rakes, MD  ciprofloxacin (CIPRO) 500 MG tablet Take 1 tablet (500 mg total) by mouth 2 (two) times daily. Patient not taking: Reported on 05/05/2022 01/31/22   Milus Banister, MD  lactulose, encephalopathy, (CHRONULAC) 10 GM/15ML SOLN Take 30 mLs (20 g total) by mouth daily. Patient not taking: Reported on 05/05/2022 06/21/21   Levin Erp, PA  naltrexone (DEPADE) 50 MG tablet Take 1 tablet (50 mg total) by mouth daily. Patient not taking: Reported on 05/05/2022 10/03/20   Charlott Rakes, MD  potassium chloride SA (KLOR-CON M) 20 MEQ tablet Take 1 tablet (20 mEq total) by mouth daily. Patient not taking: Reported on 05/05/2022 05/07/21   Petrucelli, Glynda Jaeger, PA-C  predniSONE (DELTASONE) 50 MG tablet Give 13 hours, 7 hours, and 1 hour prior to contrast dye injection.  Notify pharmacy to schedule dose as appropriate based on procedure date/time. 05/03/22   Vladimir Crofts, PA-C      Allergies    Iohexol    Review of Systems   Review of Systems  Constitutional:  Positive for fatigue.  HENT: Negative.    Respiratory: Negative.    Cardiovascular: Negative.   Gastrointestinal: Negative.  Genitourinary: Negative.   Musculoskeletal: Negative.   Skin: Negative.   Neurological:  Positive for weakness (generalized). Negative for dizziness, tremors, seizures, syncope, facial asymmetry, speech difficulty, light-headedness, numbness and headaches.  All other systems reviewed and are negative.   Physical Exam Updated Vital Signs BP 114/80   Pulse (!) 114   Temp 98.1 F (36.7 C) (Oral)   Resp 16   SpO2 100%  Physical Exam Vitals and nursing note reviewed.  Constitutional:      General: He is not in  acute distress.    Appearance: He is well-developed. He is not ill-appearing, toxic-appearing or diaphoretic.  HENT:     Head: Normocephalic and atraumatic.     Nose: Nose normal.     Mouth/Throat:     Mouth: Mucous membranes are moist.  Eyes:     Pupils: Pupils are equal, round, and reactive to light.     Comments: Mild scleral icterus  Cardiovascular:     Rate and Rhythm: Regular rhythm. Tachycardia present.     Pulses: Normal pulses.          Radial pulses are 2+ on the right side and 2+ on the left side.     Heart sounds: Normal heart sounds.  Pulmonary:     Effort: Pulmonary effort is normal. No respiratory distress.     Breath sounds: Normal breath sounds and air entry.     Comments: Clear bilaterally, speaks in full sentences without difficulty. Abdominal:     General: Bowel sounds are normal. There is no distension.     Palpations: Abdomen is soft.     Tenderness: There is no abdominal tenderness.     Comments: Abdomen soft, nontender  Genitourinary:    Rectum: Guaiac result negative. No tenderness.     Comments: Male tech in room as chaperone.  Light brown stool in rectal vault.  No gross melena or bright blood per rectum. Musculoskeletal:        General: Normal range of motion.     Cervical back: Normal range of motion and neck supple.  Skin:    General: Skin is warm and dry.     Capillary Refill: Capillary refill takes less than 2 seconds.     Comments: Mild scleral icterus  Neurological:     General: No focal deficit present.     Mental Status: He is alert and oriented to person, place, and time.     Cranial Nerves: Cranial nerves 2-12 are intact.     Sensory: Sensation is intact.     Motor: Motor function is intact.     Gait: Gait is intact.     Comments: Minimal asterixis    ED Results / Procedures / Treatments   Labs (all labs ordered are listed, but only abnormal results are displayed) Labs Reviewed  CBC WITH DIFFERENTIAL/PLATELET - Abnormal; Notable  for the following components:      Result Value   RBC 2.58 (*)    Hemoglobin 5.0 (*)    HCT 17.5 (*)    MCV 67.8 (*)    MCH 19.4 (*)    MCHC 28.6 (*)    RDW 33.3 (*)    nRBC 0.4 (*)    All other components within normal limits  BASIC METABOLIC PANEL - Abnormal; Notable for the following components:   Potassium 3.4 (*)    Glucose, Bld 272 (*)    Creatinine, Ser 0.51 (*)    Calcium 8.2 (*)    All other components  within normal limits  HEPATIC FUNCTION PANEL - Abnormal; Notable for the following components:   Total Protein 8.4 (*)    Albumin 3.4 (*)    Total Bilirubin 3.5 (*)    Bilirubin, Direct 2.1 (*)    Indirect Bilirubin 1.4 (*)    All other components within normal limits  HEMOGLOBIN A1C  AMMONIA  CK  VITAMIN B12  FOLATE  IRON AND TIBC  FERRITIN  RETICULOCYTES  LACTIC ACID, PLASMA  LACTIC ACID, PLASMA  MAGNESIUM  PHOSPHORUS  PREALBUMIN  PROTIME-INR  TSH  URINALYSIS, COMPLETE (UACMP) WITH MICROSCOPIC  POC OCCULT BLOOD, ED  TYPE AND SCREEN  PREPARE RBC (CROSSMATCH)    EKG EKG Interpretation  Date/Time:  Sunday May 05 2022 21:56:17 EDT Ventricular Rate:  108 PR Interval:  168 QRS Duration: 91 QT Interval:  338 QTC Calculation: 453 R Axis:   54 Text Interpretation: Sinus tachycardia Confirmed by Quintella Reichert [phone]) on 05/05/2022 10:59:11 PM  Radiology No results found.  Procedures .Critical Care  Performed by: Nettie Elm, PA-C Authorized by: Nettie Elm, PA-C   Critical care provider statement:    Critical care time (minutes):  35   Critical care was necessary to treat or prevent imminent or life-threatening deterioration of the following conditions:  Circulatory failure   Critical care was time spent personally by me on the following activities:  Development of treatment plan with patient or surrogate, discussions with consultants, evaluation of patient's response to treatment, examination of patient, ordering and review of  laboratory studies, ordering and review of radiographic studies, ordering and performing treatments and interventions, pulse oximetry, re-evaluation of patient's condition and review of old charts     Medications Ordered in ED Medications  0.9 %  sodium chloride infusion (0 mL/hr Intravenous Hold 05/05/22 2327)  insulin aspart (novoLOG) injection 0-9 Units (has no administration in time range)  0.9 %  sodium chloride infusion (Manually program via Guardrails IV Fluids) (has no administration in time range)  potassium chloride 10 mEq in 100 mL IVPB (has no administration in time range)    ED Course/ Medical Decision Making/ A&P    51 year old history of chronic EtOH use, esophageal varices sent in by GI for acute anemia.  States he has history of similar however has been many years since he needed transfusion.  He denies any gross bleeding.  He is occult negative from below.  He does still admit to drinking alcohol, last use 2 days ago.  No history of withdrawal.  He is mildly tachycardic here.  Admits to some fatigue, generalized weakness and some shortness of breath.  No chest pain, back pain, numbness or weakness.  His abdomen is soft, nontender.  His heart and lungs are clear.  Does not appear grossly fluid overloaded.  Labs and imaging personally viewed and interpreted:  CBC without leukocytosis, hemoglobin 5.0>> baseline appears to be 8-9 Metabolic panel potassium 3.4, glucose 272, normal anion gap Occult negative EKG sinus tachycardia, No ST changes  Patient reassessed.  I discussed his labs and imaging. Thankfully no obvious bleed on exam.  He does have some minimal asterixis which appears to be chronic in nature per his GI note.  He is not compliant with lactulose.  He has no altered mental status.  Family in room agrees he is at his baseline mentation.  I recommended transfusion and admission for further work-up.  He is agreeable.  Admitted for symptomatic anemia.  Agree with GI  note likely due to portal  gastropathy due to his alcohol use.  He is no evidence of alcohol withdrawal at this time.  Does have some mild hyperglycemia however no evidence of DKA, HHS.  His abdomen is soft, nontender.  No hematemesis.  Low suspicion for esophageal bleed.  CONSULT with Dr. Roel Cluck with [facility] who is agreeable to evaluate patient for admission.   Attempted to message GI, Dr. Fuller Plan, offline, unable to send message. Will page. I did add to treatment team.   The patient appears reasonably stabilized for admission considering the current resources, flow, and capabilities available in the ED at this time, and I doubt any other [facility] requiring further screening and/or treatment in the ED prior to admission.                           Medical Decision Making Amount and/or Complexity of Data Reviewed Independent Historian: parent External Data Reviewed: labs, radiology, ECG and notes. Labs: ordered. Decision-making details documented in ED Course. ECG/medicine tests: ordered and independent interpretation performed. Decision-making details documented in ED Course.  Risk OTC drugs. Prescription drug management. Parenteral controlled substances. Decision regarding hospitalization. Diagnosis or treatment significantly limited by social determinants of health.         Final Clinical Impression(s) / ED Diagnoses Final diagnoses:  Symptomatic anemia  Alcohol dependence with unspecified alcohol-induced disorder ([place])  Alcoholic cirrhosis of liver without ascites [facility])    Rx / DC Orders ED Discharge Orders     None         Leilene Diprima A, PA-C 05/05/22 2351    Quintella Reichert, MD 05/10/22 [phone]

## 2022-05-06 DIAGNOSIS — D509 Iron deficiency anemia, unspecified: Secondary | ICD-10-CM | POA: Diagnosis present

## 2022-05-06 DIAGNOSIS — E875 Hyperkalemia: Secondary | ICD-10-CM | POA: Diagnosis present

## 2022-05-06 DIAGNOSIS — E119 Type 2 diabetes mellitus without complications: Secondary | ICD-10-CM | POA: Diagnosis present

## 2022-05-06 DIAGNOSIS — K862 Cyst of pancreas: Secondary | ICD-10-CM | POA: Diagnosis present

## 2022-05-06 DIAGNOSIS — Z7189 Other specified counseling: Secondary | ICD-10-CM | POA: Diagnosis not present

## 2022-05-06 DIAGNOSIS — E876 Hypokalemia: Secondary | ICD-10-CM | POA: Diagnosis present

## 2022-05-06 DIAGNOSIS — D62 Acute posthemorrhagic anemia: Secondary | ICD-10-CM | POA: Diagnosis present

## 2022-05-06 DIAGNOSIS — E440 Moderate protein-calorie malnutrition: Secondary | ICD-10-CM | POA: Diagnosis present

## 2022-05-06 DIAGNOSIS — I8511 Secondary esophageal varices with bleeding: Secondary | ICD-10-CM | POA: Diagnosis not present

## 2022-05-06 DIAGNOSIS — E785 Hyperlipidemia, unspecified: Secondary | ICD-10-CM | POA: Diagnosis present

## 2022-05-06 DIAGNOSIS — K7031 Alcoholic cirrhosis of liver with ascites: Secondary | ICD-10-CM | POA: Diagnosis present

## 2022-05-06 DIAGNOSIS — J189 Pneumonia, unspecified organism: Secondary | ICD-10-CM | POA: Insufficient documentation

## 2022-05-06 DIAGNOSIS — F1029 Alcohol dependence with unspecified alcohol-induced disorder: Secondary | ICD-10-CM | POA: Diagnosis present

## 2022-05-06 DIAGNOSIS — D124 Benign neoplasm of descending colon: Secondary | ICD-10-CM | POA: Diagnosis not present

## 2022-05-06 DIAGNOSIS — I10 Essential (primary) hypertension: Secondary | ICD-10-CM | POA: Diagnosis present

## 2022-05-06 DIAGNOSIS — D649 Anemia, unspecified: Secondary | ICD-10-CM | POA: Diagnosis not present

## 2022-05-06 DIAGNOSIS — G40909 Epilepsy, unspecified, not intractable, without status epilepticus: Secondary | ICD-10-CM | POA: Diagnosis present

## 2022-05-06 DIAGNOSIS — K274 Chronic or unspecified peptic ulcer, site unspecified, with hemorrhage: Secondary | ICD-10-CM | POA: Diagnosis not present

## 2022-05-06 DIAGNOSIS — K920 Hematemesis: Secondary | ICD-10-CM | POA: Diagnosis present

## 2022-05-06 DIAGNOSIS — K766 Portal hypertension: Secondary | ICD-10-CM | POA: Diagnosis present

## 2022-05-06 DIAGNOSIS — E1165 Type 2 diabetes mellitus with hyperglycemia: Secondary | ICD-10-CM | POA: Diagnosis present

## 2022-05-06 DIAGNOSIS — K3189 Other diseases of stomach and duodenum: Secondary | ICD-10-CM | POA: Diagnosis not present

## 2022-05-06 DIAGNOSIS — I8510 Secondary esophageal varices without bleeding: Secondary | ICD-10-CM | POA: Diagnosis present

## 2022-05-06 DIAGNOSIS — Y92009 Unspecified place in unspecified non-institutional (private) residence as the place of occurrence of the external cause: Secondary | ICD-10-CM | POA: Diagnosis not present

## 2022-05-06 DIAGNOSIS — K269 Duodenal ulcer, unspecified as acute or chronic, without hemorrhage or perforation: Secondary | ICD-10-CM | POA: Diagnosis not present

## 2022-05-06 DIAGNOSIS — Z79899 Other long term (current) drug therapy: Secondary | ICD-10-CM | POA: Diagnosis not present

## 2022-05-06 DIAGNOSIS — K7030 Alcoholic cirrhosis of liver without ascites: Secondary | ICD-10-CM | POA: Diagnosis present

## 2022-05-06 DIAGNOSIS — K2289 Other specified disease of esophagus: Secondary | ICD-10-CM | POA: Diagnosis not present

## 2022-05-06 DIAGNOSIS — E871 Hypo-osmolality and hyponatremia: Secondary | ICD-10-CM | POA: Diagnosis present

## 2022-05-06 DIAGNOSIS — K7682 Hepatic encephalopathy: Secondary | ICD-10-CM | POA: Diagnosis present

## 2022-05-06 DIAGNOSIS — K219 Gastro-esophageal reflux disease without esophagitis: Secondary | ICD-10-CM | POA: Diagnosis present

## 2022-05-06 DIAGNOSIS — Z515 Encounter for palliative care: Secondary | ICD-10-CM | POA: Diagnosis not present

## 2022-05-06 DIAGNOSIS — Z7984 Long term (current) use of oral hypoglycemic drugs: Secondary | ICD-10-CM | POA: Diagnosis not present

## 2022-05-06 DIAGNOSIS — D6959 Other secondary thrombocytopenia: Secondary | ICD-10-CM | POA: Diagnosis present

## 2022-05-06 LAB — RETICULOCYTES
Immature Retic Fract: 17.9 % — ABNORMAL HIGH (ref 2.3–15.9)
RBC.: 2.55 MIL/uL — ABNORMAL LOW (ref 4.22–5.81)
Retic Count, Absolute: 35.7 10*3/uL (ref 19.0–186.0)
Retic Ct Pct: 1.4 % (ref 0.4–3.1)

## 2022-05-06 LAB — HEMOGLOBIN AND HEMATOCRIT, BLOOD
HCT: 23.5 % — ABNORMAL LOW (ref 39.0–52.0)
Hemoglobin: 7.2 g/dL — ABNORMAL LOW (ref 13.0–17.0)

## 2022-05-06 LAB — PROTIME-INR
INR: 1.4 — ABNORMAL HIGH (ref 0.8–1.2)
INR: 1.5 — ABNORMAL HIGH (ref 0.8–1.2)
Prothrombin Time: 16.7 seconds — ABNORMAL HIGH (ref 11.4–15.2)
Prothrombin Time: 17.6 seconds — ABNORMAL HIGH (ref 11.4–15.2)

## 2022-05-06 LAB — URINALYSIS, COMPLETE (UACMP) WITH MICROSCOPIC
Glucose, UA: 100 mg/dL — AB
Hgb urine dipstick: NEGATIVE
Ketones, ur: NEGATIVE mg/dL
Leukocytes,Ua: NEGATIVE
Nitrite: NEGATIVE
Specific Gravity, Urine: 1.015 (ref 1.005–1.030)
pH: 6.5 (ref 5.0–8.0)

## 2022-05-06 LAB — FERRITIN: Ferritin: 5 ng/mL — ABNORMAL LOW (ref 24–336)

## 2022-05-06 LAB — COMPREHENSIVE METABOLIC PANEL
ALT: 12 U/L (ref 0–44)
AST: 36 U/L (ref 15–41)
Albumin: 2.9 g/dL — ABNORMAL LOW (ref 3.5–5.0)
Alkaline Phosphatase: 96 U/L (ref 38–126)
Anion gap: 9 (ref 5–15)
BUN: 7 mg/dL (ref 6–20)
CO2: 21 mmol/L — ABNORMAL LOW (ref 22–32)
Calcium: 7.7 mg/dL — ABNORMAL LOW (ref 8.9–10.3)
Chloride: 108 mmol/L (ref 98–111)
Creatinine, Ser: 0.38 mg/dL — ABNORMAL LOW (ref 0.61–1.24)
GFR, Estimated: >60 mL/min (ref >60)
Glucose, Bld: 98 mg/dL (ref 70–99)
Potassium: 3.2 mmol/L — ABNORMAL LOW (ref 3.5–5.1)
Sodium: 138 mmol/L (ref 135–145)
Total Bilirubin: 3.1 mg/dL — ABNORMAL HIGH (ref 0.3–1.2)
Total Protein: 7 g/dL (ref 6.5–8.1)

## 2022-05-06 LAB — CK: Total CK: 66 U/L (ref 49–397)

## 2022-05-06 LAB — MAGNESIUM
Magnesium: 1.4 mg/dL — ABNORMAL LOW (ref 1.7–2.4)
Magnesium: 1.7 mg/dL (ref 1.7–2.4)

## 2022-05-06 LAB — CBC
HCT: 15.6 % — ABNORMAL LOW (ref 39.0–52.0)
Hemoglobin: 4.5 g/dL — CL (ref 13.0–17.0)
MCH: 19.7 pg — ABNORMAL LOW (ref 26.0–34.0)
MCHC: 28.8 g/dL — ABNORMAL LOW (ref 30.0–36.0)
MCV: 68.1 fL — ABNORMAL LOW (ref 80.0–100.0)
Platelets: 159 10*3/uL (ref 150–400)
RBC: 2.29 MIL/uL — ABNORMAL LOW (ref 4.22–5.81)
RDW: 33.3 % — ABNORMAL HIGH (ref 11.5–15.5)
WBC: 4.5 10*3/uL (ref 4.0–10.5)
nRBC: 0 % (ref 0.0–0.2)

## 2022-05-06 LAB — IRON AND TIBC
Iron: 21 ug/dL — ABNORMAL LOW (ref 45–182)
Saturation Ratios: 4 % — ABNORMAL LOW (ref 17.9–39.5)
TIBC: 536 ug/dL — ABNORMAL HIGH (ref 250–450)
UIBC: 515 ug/dL

## 2022-05-06 LAB — PHOSPHORUS
Phosphorus: 2.9 mg/dL (ref 2.5–4.6)
Phosphorus: 3.1 mg/dL (ref 2.5–4.6)

## 2022-05-06 LAB — CBG MONITORING, ED
Glucose-Capillary: 201 mg/dL — ABNORMAL HIGH (ref 70–99)
Glucose-Capillary: 209 mg/dL — ABNORMAL HIGH (ref 70–99)
Glucose-Capillary: 295 mg/dL — ABNORMAL HIGH (ref 70–99)
Glucose-Capillary: 94 mg/dL (ref 70–99)

## 2022-05-06 LAB — AMMONIA: Ammonia: 119 umol/L — ABNORMAL HIGH (ref 9–35)

## 2022-05-06 LAB — GLUCOSE, CAPILLARY
Glucose-Capillary: 128 mg/dL — ABNORMAL HIGH (ref 70–99)
Glucose-Capillary: 232 mg/dL — ABNORMAL HIGH (ref 70–99)

## 2022-05-06 LAB — LACTIC ACID, PLASMA
Lactic Acid, Venous: 2.5 mmol/L (ref 0.5–1.9)
Lactic Acid, Venous: 2.5 mmol/L (ref 0.5–1.9)

## 2022-05-06 LAB — HEMOGLOBIN A1C
Hgb A1c MFr Bld: 8.3 % — ABNORMAL HIGH (ref 4.8–5.6)
Mean Plasma Glucose: 191.51 mg/dL

## 2022-05-06 LAB — HIV ANTIBODY (ROUTINE TESTING W REFLEX): HIV Screen 4th Generation wRfx: NONREACTIVE

## 2022-05-06 LAB — PATHOLOGIST SMEAR REVIEW

## 2022-05-06 LAB — CANCER ANTIGEN 19-9: CA 19-9: 61 U/mL — ABNORMAL HIGH (ref <34)

## 2022-05-06 LAB — PREPARE RBC (CROSSMATCH)

## 2022-05-06 LAB — PREALBUMIN: Prealbumin: 14 mg/dL — ABNORMAL LOW (ref 18–38)

## 2022-05-06 LAB — VITAMIN B12: Vitamin B-12: 279 pg/mL (ref 180–914)

## 2022-05-06 LAB — FOLATE: Folate: 10.8 ng/mL (ref >5.9)

## 2022-05-06 LAB — TSH: TSH: 2.829 u[IU]/mL (ref 0.350–4.500)

## 2022-05-06 MED ORDER — PANTOPRAZOLE SODIUM 40 MG IV SOLR
40.0000 mg | Freq: Two times a day (BID) | INTRAVENOUS | Status: DC
  Administered 2022-05-06 – 2022-05-12 (×12): 40 mg via INTRAVENOUS
  Filled 2022-05-06 (×12): qty 10

## 2022-05-06 MED ORDER — HYDRALAZINE HCL 25 MG PO TABS: 25.0000 mg | ORAL_TABLET | ORAL | Status: DC | PRN

## 2022-05-06 MED ORDER — SODIUM CHLORIDE 0.9 % IV SOLN: INTRAVENOUS | Status: DC

## 2022-05-06 MED ORDER — SODIUM CHLORIDE 0.9 % IV SOLN
25.0000 mg | Freq: Once | INTRAVENOUS | Status: AC
  Administered 2022-05-06: 25 mg via INTRAVENOUS
  Filled 2022-05-06: qty 0.5

## 2022-05-06 MED ORDER — GABAPENTIN 300 MG PO CAPS
300.0000 mg | ORAL_CAPSULE | Freq: Every day | ORAL | Status: DC
  Administered 2022-05-06 – 2022-05-11 (×7): 300 mg via ORAL
  Filled 2022-05-06 (×7): qty 1

## 2022-05-06 MED ORDER — LABETALOL HCL 5 MG/ML IV SOLN: 10.0000 mg | INTRAVENOUS | Status: DC | PRN

## 2022-05-06 MED ORDER — PANTOPRAZOLE SODIUM 40 MG PO TBEC
40.0000 mg | DELAYED_RELEASE_TABLET | Freq: Every day | ORAL | Status: DC
  Administered 2022-05-06: 40 mg via ORAL
  Filled 2022-05-06: qty 1

## 2022-05-06 MED ORDER — ONDANSETRON HCL 4 MG/2ML IJ SOLN
4.0000 mg | Freq: Four times a day (QID) | INTRAMUSCULAR | Status: DC | PRN
  Administered 2022-05-06 – 2022-05-09 (×2): 4 mg via INTRAVENOUS
  Filled 2022-05-06 (×2): qty 2

## 2022-05-06 MED ORDER — LACTULOSE 10 GM/15ML PO SOLN
20.0000 g | Freq: Two times a day (BID) | ORAL | Status: DC
  Administered 2022-05-06 – 2022-05-07 (×2): 20 g via ORAL
  Filled 2022-05-06 (×7): qty 30

## 2022-05-06 MED ORDER — SODIUM CHLORIDE 0.9 % IV SOLN
1000.0000 mg | Freq: Once | INTRAVENOUS | Status: AC
  Administered 2022-05-06: 1000 mg via INTRAVENOUS
  Filled 2022-05-06 (×2): qty 20

## 2022-05-06 MED ORDER — ORAL CARE MOUTH RINSE: 15.0000 mL | OROMUCOSAL | Status: DC | PRN

## 2022-05-06 MED ORDER — LEVETIRACETAM 500 MG PO TABS
500.0000 mg | ORAL_TABLET | Freq: Two times a day (BID) | ORAL | Status: DC
  Administered 2022-05-06 – 2022-05-12 (×14): 500 mg via ORAL
  Filled 2022-05-06 (×14): qty 1

## 2022-05-06 MED ORDER — HYDROCODONE-ACETAMINOPHEN 5-325 MG PO TABS: 1.0000 | ORAL_TABLET | ORAL | Status: DC | PRN

## 2022-05-06 MED ORDER — MAGNESIUM SULFATE 2 GM/50ML IV SOLN
2.0000 g | Freq: Once | INTRAVENOUS | Status: AC
  Administered 2022-05-06: 2 g via INTRAVENOUS
  Filled 2022-05-06: qty 50

## 2022-05-06 NOTE — Hospital Course (Signed)
Mr. 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 He had outpatient lab work checked and was found to have critical low hemoglobin and was referred to the ER. On work-up he was found to have hemoglobin 5 g/dL and was ordered 2 units PRBC. GI was also consulted on admission.

## 2022-05-06 NOTE — Assessment & Plan Note (Signed)
Will replace ?

## 2022-05-06 NOTE — Progress Notes (Signed)
PT Cancellation Note  Patient Details Name: Gary Mitchell MRN: [id number] DOB: [birth year]   Cancelled Treatment:    Reason Eval/Treat Not Completed: Medical issues which prohibited therapy--low hemoglobin. Will hold PT at this time. Will check back another day.     [facility]  Office: [phone]

## 2022-05-06 NOTE — Consult Note (Addendum)
Consultation Note   Referring Provider: [facility] PCP: Charlott Rakes, MD Primary Gastroenterologist: Owens Loffler, MD and Justice Britain, MD Reason for consultation: anemia, cirrhosis.   Hospital Day: 2  Assessment / Plan   # 51 yo male with Etoh cirrhosis complicated by portal HTN with history of hepatic encephalopathy, esophageal varices). MELD-NA 16. Admitted with symptomatic anemia.  Ascites: no history of ascites. No obvious ascites on exam today  Hepatic encephalopathy:  takes lactulose at home "sometimes". Has BID lactulose ordered. Titrate dose to 3 BMs / day Varices screening/surveillance:  small esophageal varices seen on endoscopic portion of EUS in July 2023.  [place] screening:  No liver lesions [place] Dec 2022. Scheduled for outpatient CT scan to assess pancreatic lesion and can also evaluate for any liver lesions CIWA protocol in place   # Symptomatic microcytic anemia. Hgb 5, down from 8.2 in May. Heme negative. Denies overt GI bleeding. No focal GI symptoms. Was takes Aleve / ibuprofen last week for foot pain. Anemia may be multifactorial ( GI bleed? , cirrhosis, bone marrow suppression),  Blood transfusion in progress. Getting 2 units Schedule for EGD. The risks and benefits of EGD with possible biopsies were discussed with the patient who agrees to proceed. He had solids for breakfast so cannot proceed today. Most likely will be done tomorrow. Am CBC Continue PPI  Addendum: Ferritin is 5, he is iron deficient. I think we should add a colonoscopy on to the EGD tomorrow.   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 Followed by Dr. Rush Landmark. Cystic lesion on MRI in May 2023. EUS in July 2023 showed thickening but no masses.  Scheduled for outpatient CT scan with pancreatic protocol.  CA 19-9 pending  # See PMH for additional medical problems   HPI   Gary Mitchell is a 51  y.o. male with a past medical history significant for Etoh cirrhosis complicated by portal HTN with history of hepatic encephalopathy, pancreatitis, Etoh hepatitis, DM2, seizures.   See PMH for any additional medical problems.  Patient was seen in the office 05/03/22, please refer to that note for details. In summary, he was still consuming Etoh.  Has history of cystic lesion of pancreas. We have scheduled him to a CT pancreatic protocol. Counseled on Etoh cessation. Labs collected and showed hgb of 6.7, down from 8.2 in May. He was sent to ED   Patient presented to ED yesterday.    Significant studies:  Lactic acid 2.5 WBC 5.1 Hgb 5, MCV 67, platelets 223 Glu 272 BUN 9, cr 0.51  GFR >60 Tbili 3.5, ALK phos 112, AST 41, ALT 14 CXR without acute findings  [place] hasn't seen any blood in his stool, no black stool. No abdominal pain. He has had some N/V but emesis not bloody or dark. Last etoh drink was 3-4 days ago. He has been [facility]. Last week he was taking aleve / ibuprofen for foot pain.    Recent Labs and Imaging DG Chest 2 View  Result Date: 05/06/2022 CLINICAL DATA:  Shortness of breath for 2-3 weeks. EXAM: CHEST - 2 VIEW COMPARISON:  Chest radiograph dated 05/06/2021. FINDINGS: The heart size and mediastinal contours are within normal limits. Both  lungs are clear. Multiple anterior wedge deformities of the midthoracic spine appear unchanged from prior exam with up to 25-50% height loss anteriorly. IMPRESSION: No active cardiopulmonary disease. Electronically Signed   By: Zerita Boers M.D.   On: 05/06/2022 08:37    Labs:  Recent Labs    05/05/22 2204 05/06/22 0452  WBC 5.1 4.5  HGB 5.0* 4.5*  HCT 17.5* 15.6*  PLT 223 159   Recent Labs    05/05/22 2204 05/06/22 0452  NA 138 138  K 3.4* 3.2*  CL 105 108  CO2 22 21*  GLUCOSE 272* 98  BUN 9 7  CREATININE 0.51* 0.38*  CALCIUM 8.2* 7.7*   Recent Labs    05/05/22 2204 05/06/22 0452  PROT 8.4* 7.0  ALBUMIN 3.4* 2.9*   AST 41 36  ALT 14 12  ALKPHOS 112 96  BILITOT 3.5* 3.1*  BILIDIR 2.1*  --   IBILI 1.4*  --    No results for input(s): "HEPBSAG", "HCVAB", "HEPAIGM", "HEPBIGM" in the last 72 hours. Recent Labs    05/05/22 2359 05/06/22 0452  LABPROT 16.7* 17.6*  INR 1.4* 1.5*    Past Medical History:  Diagnosis Date   Alcoholic hepatitis 27/0350   Cirrhosis with alcoholism ([place]) 11/2019   Coagulopathy ([place])    Attributed to liver disease/cirrhosis   Pancreatic lesion 11/2019   Initially concerning for neoplasm but improved appearance on MRI 12/2019 at which time pseudocyst was most likely diagnosis.   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   Seizures ([place])    Thrombocytopenia ([place])     Past Surgical History:  Procedure Laterality Date   BIOPSY  08/02/2021   Procedure: BIOPSY;  Surgeon: Milus Banister, MD;  Location: [facility];  Service: Endoscopy;;   ENTEROSCOPY N/A 09/20/2020   Procedure: ENTEROSCOPY;  Surgeon: Thornton Park, MD;  [facility];  Service: Gastroenterology;  Laterality: N/A;   ESOPHAGOGASTRODUODENOSCOPY (EGD) WITH PROPOFOL N/A 08/02/2021   Procedure: ESOPHAGOGASTRODUODENOSCOPY (EGD) WITH PROPOFOL;  Surgeon: Milus Banister, MD;  Location: [facility];  Service: Endoscopy;  Laterality: N/A;   ESOPHAGOGASTRODUODENOSCOPY (EGD) WITH PROPOFOL N/A 01/31/2022   Procedure: ESOPHAGOGASTRODUODENOSCOPY (EGD) WITH PROPOFOL;  Surgeon: Milus Banister, MD;  Location: [facility];  Service: Gastroenterology;  Laterality: N/A;   EUS N/A 01/31/2022   Procedure: UPPER ENDOSCOPIC ULTRASOUND (EUS) RADIAL;  Surgeon: Milus Banister, MD;  Location: [facility];  Service: Gastroenterology;  Laterality: N/A;   FINE NEEDLE ASPIRATION N/A 08/02/2021   Procedure: FINE NEEDLE ASPIRATION (FNA) LINEAR;  Surgeon: Milus Banister, MD;  Location: [facility];  Service: Endoscopy;  Laterality: N/A;   FINE  NEEDLE ASPIRATION N/A 01/31/2022   Procedure: FINE NEEDLE ASPIRATION (FNA) LINEAR;  Surgeon: Milus Banister, MD;  Location: [facility];  Service: Gastroenterology;  Laterality: N/A;   left kidney removed     UPPER ESOPHAGEAL ENDOSCOPIC ULTRASOUND (EUS) N/A 08/02/2021   Procedure: UPPER ESOPHAGEAL ENDOSCOPIC ULTRASOUND (EUS);  Surgeon: Milus Banister, MD;  Location: [facility];  Service: Endoscopy;  Laterality: N/A;  Radial and Linear    Family History  Problem Relation Age of Onset   Diabetes Mellitus II Mother    Colon cancer Neg Hx    Esophageal cancer Neg Hx     Prior to Admission medications   Medication Sig Start Date End Date Taking? Authorizing Provider  gabapentin (NEURONTIN) 300 MG capsule Take 1 capsule (  300 mg total) by mouth at bedtime. 11/20/21  Yes Newlin, Enobong, MD  levETIRAcetam (KEPPRA) 500 MG tablet Take 1 tablet (500 mg total) by mouth 2 (two) times daily. 11/20/21 06/02/22 Yes Newlin, Enobong, MD  metFORMIN (GLUCOPHAGE) 500 MG tablet Take 2 tablets (1,000 mg total) by mouth 2 (two) times daily with a meal. 11/20/21  Yes Newlin, Enobong, MD  Multiple Vitamin (MULTIVITAMIN) tablet Take 1 tablet by mouth at bedtime.   Yes [provider]  pantoprazole (PROTONIX) 40 MG tablet Take 1 tablet (40 mg total) by mouth daily. 05/03/22  Yes Collier, Amanda R, PA-C  Potassium 99 MG TABS Take 1 tablet by mouth daily as needed (cramps).   Yes [provider]  atorvastatin (LIPITOR) 20 MG tablet Take 1 tablet (20 mg total) by mouth daily. 05/29/21   Newlin, Enobong, MD  ciprofloxacin (CIPRO) 500 MG tablet Take 1 tablet (500 mg total) by mouth 2 (two) times daily. Patient not taking: Reported on 05/05/2022 01/31/22   Jacobs, Daniel P, MD  lactulose, encephalopathy, (CHRONULAC) 10 GM/15ML SOLN Take 30 mLs (20 g total) by mouth daily. Patient not taking: Reported on 05/05/2022 06/21/21   Lemmon, Jennifer Lynne, PA  naltrexone (DEPADE) 50 MG tablet Take 1 tablet (50  mg total) by mouth daily. Patient not taking: Reported on 05/05/2022 10/03/20   Newlin, Enobong, MD  potassium chloride SA (KLOR-CON M) 20 MEQ tablet Take 1 tablet (20 mEq total) by mouth daily. Patient not taking: Reported on 05/05/2022 05/07/21   Petrucelli, Samantha R, PA-C  predniSONE (DELTASONE) 50 MG tablet Give 13 hours, 7 hours, and 1 hour prior to contrast dye injection.  Notify pharmacy to schedule dose as appropriate based on procedure date/time. 05/03/22   Collier, Amanda R, PA-C    Current Facility-Administered Medications  Medication Dose Route Frequency Provider Last Rate Last Admin   0.9 %  sodium chloride infusion  10 mL/hr Intravenous Once Doutova, Anastassia, MD   Held at 05/05/22 2327   0.9 %  sodium chloride infusion   Intravenous Continuous Doutova, Anastassia, MD   Stopped at 05/06/22 0755   folic acid (FOLVITE) tablet 1 mg  1 mg Oral Daily Doutova, Anastassia, MD   1 mg at 05/06/22 0941   gabapentin (NEURONTIN) capsule 300 mg  300 mg Oral QHS Doutova, Anastassia, MD   300 mg at 05/06/22 0330   HYDROcodone-acetaminophen (NORCO/VICODIN) 5-325 MG per tablet 1-2 tablet  1-2 tablet Oral Q4H PRN Doutova, Anastassia, MD       insulin aspart (novoLOG) injection 0-9 Units  0-9 Units Subcutaneous Q4H Doutova, Anastassia, MD   3 Units at 05/06/22 0829   lactulose (CHRONULAC) 10 GM/15ML solution 20 g  20 g Oral BID Doutova, Anastassia, MD   20 g at 05/06/22 0940   levETIRAcetam (KEPPRA) tablet 500 mg  500 mg Oral BID Doutova, Anastassia, MD   500 mg at 05/06/22 0941   LORazepam (ATIVAN) tablet 1-4 mg  1-4 mg Oral Q1H PRN Doutova, Anastassia, MD       Or   LORazepam (ATIVAN) injection 1-4 mg  1-4 mg Intravenous Q1H PRN Doutova, Anastassia, MD       multivitamin with minerals tablet 1 tablet  1 tablet Oral Daily Doutova, Anastassia, MD   1 tablet at 05/06/22 0941   pantoprazole (PROTONIX) EC tablet 40 mg  40 mg Oral Daily Doutova, Anastassia, MD   40 mg at 05/06/22 0940   thiamine  (VITAMIN B1) tablet 100 mg  100 mg Oral   Daily Toy Baker, MD   100 mg at 05/06/22 7341   Or   thiamine (VITAMIN B1) injection 100 mg  100 mg Intravenous Daily Toy Baker, MD       Current Outpatient Medications  Medication Sig Dispense Refill   gabapentin (NEURONTIN) 300 MG capsule Take 1 capsule (300 mg total) by mouth at bedtime. 30 capsule 3   levETIRAcetam (KEPPRA) 500 MG tablet Take 1 tablet (500 mg total) by mouth 2 (two) times daily. 60 tablet 3   metFORMIN (GLUCOPHAGE) 500 MG tablet Take 2 tablets (1,000 mg total) by mouth 2 (two) times daily with a meal. 120 tablet 3   Multiple Vitamin (MULTIVITAMIN) tablet Take 1 tablet by mouth at bedtime.     pantoprazole (PROTONIX) 40 MG tablet Take 1 tablet (40 mg total) by mouth daily. 30 tablet 3   Potassium 99 MG TABS Take 1 tablet by mouth daily as needed (cramps).     atorvastatin (LIPITOR) 20 MG tablet Take 1 tablet (20 mg total) by mouth daily. 30 tablet 3   ciprofloxacin (CIPRO) 500 MG tablet Take 1 tablet (500 mg total) by mouth 2 (two) times daily. (Patient not taking: Reported on 05/05/2022) 6 tablet 0   lactulose, encephalopathy, (CHRONULAC) 10 GM/15ML SOLN Take 30 mLs (20 g total) by mouth daily. (Patient not taking: Reported on 05/05/2022) 900 mL 3   naltrexone (DEPADE) 50 MG tablet Take 1 tablet (50 mg total) by mouth daily. (Patient not taking: Reported on 05/05/2022) 30 tablet 3   potassium chloride SA (KLOR-CON M) 20 MEQ tablet Take 1 tablet (20 mEq total) by mouth daily. (Patient not taking: Reported on 05/05/2022) 3 tablet 0   predniSONE (DELTASONE) 50 MG tablet Give 13 hours, 7 hours, and 1 hour prior to contrast dye injection.  Notify pharmacy to schedule dose as appropriate based on procedure date/time. 3 tablet 0    Allergies as of 05/05/2022 - Review Complete 05/05/2022  Allergen Reaction Noted   Iohexol Other (See Comments) 05/21/2016    Social History   Socioeconomic History   Marital status:  Single    Spouse name: Not on file   Number of children: Not on file   Years of education: Not on file   Highest education level: Not on file  Occupational History   Not on file  Tobacco Use   Smoking status: Former    Packs/day: 0.10    Years: 25.00    Total pack years: 2.50    Types: Cigarettes    Quit date: 2022    Years since quitting: 1.8   Smokeless tobacco: Never  Vaping Use   Vaping Use: Never used  Substance and Sexual Activity   Alcohol use: Not Currently    Alcohol/week: 3.0 standard drinks of alcohol    Types: 3 Cans of beer per week   Drug use: Not Currently    Types: Marijuana   Sexual activity: Not on file  Other Topics Concern   Not on file  Social History Narrative   Not on file   Social Determinants of Health   Financial Resource Strain: Not on file  Food Insecurity: Not on file  Transportation Needs: Not on file  Physical Activity: Not on file  Stress: Not on file  Social Connections: Not on file  Intimate Partner Violence: Not on file    Review of Systems: All systems reviewed and negative except where noted in HPI.  Physical Exam: Vital signs in last 24 hours: Temp:  [  98 F (36.7 C)-98.4 F (36.9 C)] 98.4 F (36.9 C) (10/30 0812) Pulse Rate:  [95-121] 102 (10/30 0900) Resp:  [5-23] 18 (10/30 0900) BP: (103-142)/(57-106) 142/106 (10/30 0900) SpO2:  [92 %-100 %] 96 % (10/30 0900) Weight:  [63 kg] 63 kg (10/30 0409)    General:  Alert male in NAD Psych: Agitated about EKG cables but cooperative.  Eyes: Pupils equal Ears:  Normal auditory acuity Nose: No deformity, discharge or lesions Neck:  Supple, no masses felt Lungs:  Clear to auscultation.  Heart:  Regular rate, regular rhythm. No lower extremity edema Abdomen:  Soft, nondistended, nontender, active bowel sounds, no masses felt Rectal :  Deferred Msk: Symmetrical without gross deformities.  Neurologic:  Alert, oriented, grossly normal neurologically Skin:  Intact without  significant lesions.    Intake/Output from previous day: 10/29 0701 - 10/30 0700 In: 559.9 [Blood:314; IV Piggyback:245.9] Out: -  Intake/Output this shift:  Total I/O In: 60 [I.V.:60] Out: -    Principal Problem:   Symptomatic anemia Active Problems:   Alcohol abuse   Pancreatic mass   GERD (gastroesophageal reflux disease)   Seizure (HCC)   Alcoholic cirrhosis of liver without ascites (HCC)   Hypomagnesemia   Hypokalemia   Esophageal varices without bleeding ([place])   Type 2 diabetes mellitus ([place])   Hyperlipidemia   HCAP (healthcare-associated pneumonia)    Tye Savoy, NP-C @  05/06/2022, 10:18 AM  --------------------------------------------------------------------------------------------------------------------------  I have taken a history, reviewed the chart and examined the patient. I performed a substantive portion of this encounter, including complete performance of at least one of the key components, in conjunction with the APP. I agree with the APP's note, impression and recommendations  51 year old male with alcoholic cirrhosis, admitted with symptomatic anemia (hgb 5) without overt GI bleeding, although patient reports he did have an episode of small volume hematemesis this morning.  He has chronic problems with epigastric pain and nausea/vomiting.  He has been taking Aleve recently due to foot pain.  He had small esophageal varices and portal hypertensive gastropathy on EGD/EUS in July.  He continues to drink alcohol. A repeat EGD is indicated due to upper GI symptoms and recent hematemesis.  A colonoscopy is also indicated given his iron deficiency anemia and no prior colonoscopy.  The patient is not committed to proceeding with a colonoscopy at this point.  Will plan for EGD tomorrow and then readdress colonoscopy.  The patient was feeling anxious and somewhat tremulous during my exam.  He has a history of alcohol withdrawal/seizures.  - EGD tomorrow  (likely early afternoon) - CLD now, NPO after midnight - IV PPI BID - CIWA protocol, will likely need benzos soon - CBC following transfusion - Readdress colonoscopy tomorrow  Emaleigh Guimond E. Candis Schatz, MD [facility]

## 2022-05-06 NOTE — Plan of Care (Signed)

## 2022-05-06 NOTE — Progress Notes (Signed)
Progress Note    Gary Mitchell   VPX:[id number]  DOB: 11/02/1970  DOA: 05/05/2022     0 PCP: Charlott Rakes, MD  Initial CC: low Hgb  Hospital Course: 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 He had outpatient lab work checked and was found to have critical low hemoglobin and was referred to the ER. On work-up he was found to have hemoglobin 5 g/dL and was ordered 2 units PRBC. GI was also consulted on admission.  Interval History:  Resting in bed with family present bedside this morning.  Lethargic but no acute distress.  First unit of blood was transfusing when seen. Denies any bloody stools or blood elsewhere.  Does seem to describe somewhat dark stools.  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, from recurrent pancreatitis with alcohol use - continue following with GI  DM II - continue SSI and CBG monitoring   HTN - trend BP for now - can use PRN meds  Hx seizures  - continue keppra  GERD - continue protonix   Hypokalemia - Replete as needed   Old records reviewed in assessment of this patient  Antimicrobials:   DVT prophylaxis:  SCDs Start: 05/06/22 0308   Code Status:    Code Status: Full Code  Mobility Assessment (last 72 hours)     Mobility Assessment     Row Name 05/06/22 12:14:19           Does patient have an order for bedrest or is patient medically unstable No - Continue assessment       What is the highest level of mobility based on the progressive mobility assessment? Level 5 (Walks with assist in room/hall) - Balance while stepping forward/back and can walk in room with assist - Complete                Barriers to discharge:  Disposition Plan:  Home Status is: Inpt  Objective: Blood pressure (!) 145/92, pulse (!) 102, temperature 98.2 F (36.8 C), temperature source Oral, resp. rate 15, height '5\' 4"'$  (1.626 m), weight 63 kg, SpO2 99 %.  Examination:  Physical Exam Constitutional:      General: He is not in acute distress.    Appearance: Normal appearance.  HENT:     Head: Normocephalic and atraumatic.     Mouth/Throat:     Mouth: Mucous membranes are moist.  Eyes:     Extraocular Movements: Extraocular movements intact.  Cardiovascular:     Rate and Rhythm: Normal rate and regular rhythm.     Heart sounds: Normal heart sounds.  Pulmonary:     Effort: Pulmonary effort is normal. No respiratory distress.     Breath sounds: Normal breath sounds. No wheezing.  Abdominal:  General: Bowel sounds are normal. There is no distension.     Palpations: Abdomen is soft.     Tenderness: There is no abdominal tenderness.  Musculoskeletal:        General: Normal range of motion.     Cervical back: Normal range of motion and neck supple.  Skin:    General: Skin is warm and dry.  Neurological:     General: No focal deficit present.     Mental Status: He is alert.  Psychiatric:        Mood and Affect: Mood normal.        Behavior: Behavior normal.      Consultants:  G  Procedures:    Data Reviewed: Results for orders placed or performed during the hospital encounter of 05/05/22 (from the past 24 hour(s))  CBC with  Differential     Status: Abnormal   Collection Time: 05/05/22 10:04 PM  Result Value Ref Range   WBC 5.1 4.0 - 10.5 K/uL   RBC 2.58 (L) 4.22 - 5.81 MIL/uL   Hemoglobin 5.0 (LL) 13.0 - 17.0 g/dL   HCT 17.5 (L) 39.0 - 52.0 %   MCV 67.8 (L) 80.0 - 100.0 fL   MCH 19.4 (L) 26.0 - 34.0 pg   MCHC 28.6 (L) 30.0 - 36.0 g/dL   RDW 33.3 (H) 11.5 - 15.5 %   Platelets 223 150 - 400 K/uL   nRBC 0.4 (H) 0.0 - 0.2 %   Neutrophils Relative % 50 %   Neutro Abs 2.6 1.7 - 7.7 K/uL   Lymphocytes Relative 37 %   Lymphs Abs 1.9 0.7 - 4.0 K/uL   Monocytes Relative 11 %   Monocytes Absolute 0.6 0.1 - 1.0 K/uL   Eosinophils Relative 1 %   Eosinophils Absolute 0.0 0.0 - 0.5 K/uL   Basophils Relative 1 %   Basophils Absolute 0.1 0.0 - 0.1 K/uL   Immature Granulocytes 0 %   Abs Immature Granulocytes 0.01 0.00 - 0.07 K/uL   Target Cells PRESENT    Giant PLTs PRESENT   Basic metabolic panel     Status: Abnormal   Collection Time: 05/05/22 10:04 PM  Result Value Ref Range   Sodium 138 135 - 145 mmol/L   Potassium 3.4 (L) 3.5 - 5.1 mmol/L   Chloride 105 98 - 111 mmol/L   CO2 22 22 - 32 mmol/L   Glucose, Bld 272 (H) 70 - 99 mg/dL   BUN 9 6 - 20 mg/dL   Creatinine, Ser 0.51 (L) 0.61 - 1.24 mg/dL   Calcium 8.2 (L) 8.9 - 10.3 mg/dL   GFR, Estimated >60 >60 mL/min   Anion gap 11 5 - 15  Type and screen [place]     Status: None (Preliminary result)   Collection Time: 05/05/22 10:04 PM  Result Value Ref Range   ABO/RH(D) O POS    Antibody Screen NEG    Sample Expiration 05/08/2022,2359    Unit Number [id number]    Blood Component Type RED CELLS,LR    Unit division 00    Status of Unit ISSUED    Transfusion Status OK TO TRANSFUSE    Crossmatch Result      Compatible Performed at [facility], [place] [address] [place], [place] [address]    Unit Number [id number]    Blood Component Type RED CELLS,LR    Unit division 00    Status of Unit ISSUED     Transfusion Status  OK TO TRANSFUSE    Crossmatch Result Compatible   Hepatic function panel     Status: Abnormal   Collection Time: 05/05/22 10:04 PM  Result Value Ref Range   Total Protein 8.4 (H) 6.5 - 8.1 g/dL   Albumin 3.4 (L) 3.5 - 5.0 g/dL   AST 41 15 - 41 U/L   ALT 14 0 - 44 U/L   Alkaline Phosphatase 112 38 - 126 U/L   Total Bilirubin 3.5 (H) 0.3 - 1.2 mg/dL   Bilirubin, Direct 2.1 (H) 0.0 - 0.2 mg/dL   Indirect Bilirubin 1.4 (H) 0.3 - 0.9 mg/dL  POC occult blood, ED     Status: None   Collection Time: 05/05/22 10:54 PM  Result Value Ref Range   Fecal Occult Bld NEGATIVE NEGATIVE  Prepare RBC (crossmatch)     Status: None   Collection Time: 05/05/22 10:56 PM  Result Value Ref Range   Order Confirmation      ORDER PROCESSED BY BLOOD BANK Performed at [facility], [place] [address] [place], [place] [address]   Hemoglobin A1c     Status: Abnormal   Collection Time: 05/05/22 11:59 PM  Result Value Ref Range   Hgb A1c MFr Bld 8.3 (H) 4.8 - 5.6 %   Mean Plasma Glucose 191.51 mg/dL  CK     Status: None   Collection Time: 05/05/22 11:59 PM  Result Value Ref Range   Total CK 66 49 - 397 U/L  Vitamin B12     Status: None   Collection Time: 05/05/22 11:59 PM  Result Value Ref Range   Vitamin B-12 279 180 - 914 pg/mL  Folate     Status: None   Collection Time: 05/05/22 11:59 PM  Result Value Ref Range   Folate 10.8 >5.9 ng/mL  Iron and TIBC     Status: Abnormal   Collection Time: 05/05/22 11:59 PM  Result Value Ref Range   Iron 21 (L) 45 - 182 ug/dL   TIBC 536 (H) 250 - 450 ug/dL   Saturation Ratios 4 (L) 17.9 - 39.5 %   UIBC 515 ug/dL  Ferritin     Status: Abnormal   Collection Time: 05/05/22 11:59 PM  Result Value Ref Range   Ferritin 5 (L) 24 - 336 ng/mL  Reticulocytes     Status: Abnormal   Collection Time: 05/05/22 11:59 PM  Result Value Ref Range   Retic Ct Pct 1.4 0.4 - 3.1 %   RBC. 2.55 (L) 4.22 - 5.81 MIL/uL   Retic Count, Absolute 35.7  19.0 - 186.0 K/uL   Immature Retic Fract 17.9 (H) 2.3 - 15.9 %  Magnesium     Status: Abnormal   Collection Time: 05/05/22 11:59 PM  Result Value Ref Range   Magnesium 1.4 (L) 1.7 - 2.4 mg/dL  Phosphorus     Status: None   Collection Time: 05/05/22 11:59 PM  Result Value Ref Range   Phosphorus 2.9 2.5 - 4.6 mg/dL  Protime-INR     Status: Abnormal   Collection Time: 05/05/22 11:59 PM  Result Value Ref Range   Prothrombin Time 16.7 (H) 11.4 - 15.2 seconds   INR 1.4 (H) 0.8 - 1.2  TSH     Status: None   Collection Time: 05/05/22 11:59 PM  Result Value Ref Range   TSH 2.829 0.350 - 4.500 uIU/mL  CBG monitoring, ED     Status: Abnormal   Collection Time: 05/06/22 12:20 AM  Result Value Ref  Range   Glucose-Capillary 209 (H) 70 - 99 mg/dL  Lactic acid, plasma     Status: Abnormal   Collection Time: 05/06/22  2:40 AM  Result Value Ref Range   Lactic Acid, Venous 2.5 (HH) 0.5 - 1.9 mmol/L  HIV Antibody (routine testing w rflx)     Status: None   Collection Time: 05/06/22  3:08 AM  Result Value Ref Range   HIV Screen 4th Generation wRfx Non Reactive Non Reactive  CBG monitoring, ED     Status: None   Collection Time: 05/06/22  3:34 AM  Result Value Ref Range   Glucose-Capillary 94 70 - 99 mg/dL  Comprehensive metabolic panel     Status: Abnormal   Collection Time: 05/06/22  4:52 AM  Result Value Ref Range   Sodium 138 135 - 145 mmol/L   Potassium 3.2 (L) 3.5 - 5.1 mmol/L   Chloride 108 98 - 111 mmol/L   CO2 21 (L) 22 - 32 mmol/L   Glucose, Bld 98 70 - 99 mg/dL   BUN 7 6 - 20 mg/dL   Creatinine, Ser 0.38 (L) 0.61 - 1.24 mg/dL   Calcium 7.7 (L) 8.9 - 10.3 mg/dL   Total Protein 7.0 6.5 - 8.1 g/dL   Albumin 2.9 (L) 3.5 - 5.0 g/dL   AST 36 15 - 41 U/L   ALT 12 0 - 44 U/L   Alkaline Phosphatase 96 38 - 126 U/L   Total Bilirubin 3.1 (H) 0.3 - 1.2 mg/dL   GFR, Estimated >60 >60 mL/min   Anion gap 9 5 - 15  CBC     Status: Abnormal   Collection Time: 05/06/22  4:52 AM  Result  Value Ref Range   WBC 4.5 4.0 - 10.5 K/uL   RBC 2.29 (L) 4.22 - 5.81 MIL/uL   Hemoglobin 4.5 (LL) 13.0 - 17.0 g/dL   HCT 15.6 (L) 39.0 - 52.0 %   MCV 68.1 (L) 80.0 - 100.0 fL   MCH 19.7 (L) 26.0 - 34.0 pg   MCHC 28.8 (L) 30.0 - 36.0 g/dL   RDW 33.3 (H) 11.5 - 15.5 %   Platelets 159 150 - 400 K/uL   nRBC 0.0 0.0 - 0.2 %  Magnesium     Status: None   Collection Time: 05/06/22  4:52 AM  Result Value Ref Range   Magnesium 1.7 1.7 - 2.4 mg/dL  Phosphorus     Status: None   Collection Time: 05/06/22  4:52 AM  Result Value Ref Range   Phosphorus 3.1 2.5 - 4.6 mg/dL  Protime-INR     Status: Abnormal   Collection Time: 05/06/22  4:52 AM  Result Value Ref Range   Prothrombin Time 17.6 (H) 11.4 - 15.2 seconds   INR 1.5 (H) 0.8 - 1.2  CBG monitoring, ED     Status: Abnormal   Collection Time: 05/06/22  8:18 AM  Result Value Ref Range   Glucose-Capillary 201 (H) 70 - 99 mg/dL  CBG monitoring, ED     Status: Abnormal   Collection Time: 05/06/22 12:13 PM  Result Value Ref Range   Glucose-Capillary 295 (H) 70 - 99 mg/dL  BLOOD TRANSFUSION REPORT - SCANNED     Status: None   Collection Time: 05/06/22  2:28 PM   Narrative   Ordered by an unspecified provider.    I have Reviewed nursing notes, Vitals, and Lab results since pt's last encounter. Pertinent lab results : see above I have ordered test including BMP,  CBC, Mg I have reviewed the last note from staff over past 24 hours I have discussed pt's care plan and test results with nursing staff, case manager   LOS: 0 days   Dwyane Dee, MD [facility] 05/06/2022, 2:52 PM

## 2022-05-06 NOTE — H&P (View-Only) (Signed)
Consultation Note   Referring Provider: [facility] PCP: Charlott Rakes, MD Primary Gastroenterologist: Owens Loffler, MD and Justice Britain, MD Reason for consultation: anemia, cirrhosis.   Hospital Day: 2  Assessment / Plan   # 51 yo male with Etoh cirrhosis complicated by portal HTN with history of hepatic encephalopathy, esophageal varices). MELD-NA 16. Admitted with symptomatic anemia.  Ascites: no history of ascites. No obvious ascites on exam today  Hepatic encephalopathy:  takes lactulose at home "sometimes". Has BID lactulose ordered. Titrate dose to 3 BMs / day Varices screening/surveillance:  small esophageal varices seen on endoscopic portion of EUS in July 2023.  [place] screening:  No liver lesions [place] Dec 2022. Scheduled for outpatient CT scan to assess pancreatic lesion and can also evaluate for any liver lesions CIWA protocol in place   # Symptomatic microcytic anemia. Hgb 5, down from 8.2 in May. Heme negative. Denies overt GI bleeding. No focal GI symptoms. Was takes Aleve / ibuprofen last week for foot pain. Anemia may be multifactorial ( GI bleed? , cirrhosis, bone marrow suppression),  Blood transfusion in progress. Getting 2 units Schedule for EGD. The risks and benefits of EGD with possible biopsies were discussed with the patient who agrees to proceed. He had solids for breakfast so cannot proceed today. Most likely will be done tomorrow. Am CBC Continue PPI  Addendum: Ferritin is 5, he is iron deficient. I think we should add a colonoscopy on to the EGD tomorrow.   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 Followed by Dr. Rush Landmark. Cystic lesion on MRI in May 2023. EUS in July 2023 showed thickening but no masses.  Scheduled for outpatient CT scan with pancreatic protocol.  CA 19-9 pending  # See PMH for additional medical problems   HPI   Gary Mitchell is a 51  y.o. male with a past medical history significant for Etoh cirrhosis complicated by portal HTN with history of hepatic encephalopathy, pancreatitis, Etoh hepatitis, DM2, seizures.   See PMH for any additional medical problems.  Patient was seen in the office 05/03/22, please refer to that note for details. In summary, he was still consuming Etoh.  Has history of cystic lesion of pancreas. We have scheduled him to a CT pancreatic protocol. Counseled on Etoh cessation. Labs collected and showed hgb of 6.7, down from 8.2 in May. He was sent to ED   Patient presented to ED yesterday.    Significant studies:  Lactic acid 2.5 WBC 5.1 Hgb 5, MCV 67, platelets 223 Glu 272 BUN 9, cr 0.51  GFR >60 Tbili 3.5, ALK phos 112, AST 41, ALT 14 CXR without acute findings  Gary Mitchell hasn't seen any blood in his stool, no black stool. No abdominal pain. He has had some N/V but emesis not bloody or dark. Last etoh drink was 3-4 days ago. He has been [facility]. Last week he was taking aleve / ibuprofen for foot pain.    Recent Labs and Imaging DG Chest 2 View  Result Date: 05/06/2022 CLINICAL DATA:  Shortness of breath for 2-3 weeks. EXAM: CHEST - 2 VIEW COMPARISON:  Chest radiograph dated 05/06/2021. FINDINGS: The heart size and mediastinal contours are within normal limits. Both  lungs are clear. Multiple anterior wedge deformities of the midthoracic spine appear unchanged from prior exam with up to 25-50% height loss anteriorly. IMPRESSION: No active cardiopulmonary disease. Electronically Signed   By: Zerita Boers M.D.   On: 05/06/2022 08:37    Labs:  Recent Labs    05/05/22 2204 05/06/22 0452  WBC 5.1 4.5  HGB 5.0* 4.5*  HCT 17.5* 15.6*  PLT 223 159   Recent Labs    05/05/22 2204 05/06/22 0452  NA 138 138  K 3.4* 3.2*  CL 105 108  CO2 22 21*  GLUCOSE 272* 98  BUN 9 7  CREATININE 0.51* 0.38*  CALCIUM 8.2* 7.7*   Recent Labs    05/05/22 2204 05/06/22 0452  PROT 8.4* 7.0  ALBUMIN 3.4* 2.9*   AST 41 36  ALT 14 12  ALKPHOS 112 96  BILITOT 3.5* 3.1*  BILIDIR 2.1*  --   IBILI 1.4*  --    No results for input(s): "HEPBSAG", "HCVAB", "HEPAIGM", "HEPBIGM" in the last 72 hours. Recent Labs    05/05/22 2359 05/06/22 0452  LABPROT 16.7* 17.6*  INR 1.4* 1.5*    Past Medical History:  Diagnosis Date   Alcoholic hepatitis 27/0350   Cirrhosis with alcoholism ([place]) 11/2019   Coagulopathy ([place])    Attributed to liver disease/cirrhosis   Pancreatic lesion 11/2019   Initially concerning for neoplasm but improved appearance on MRI 12/2019 at which time pseudocyst was most likely diagnosis.   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   Seizures ([place])    Thrombocytopenia ([place])     Past Surgical History:  Procedure Laterality Date   BIOPSY  08/02/2021   Procedure: BIOPSY;  Surgeon: Milus Banister, MD;  Location: [facility];  Service: Endoscopy;;   ENTEROSCOPY N/A 09/20/2020   Procedure: ENTEROSCOPY;  Surgeon: Thornton Park, MD;  [facility];  Service: Gastroenterology;  Laterality: N/A;   ESOPHAGOGASTRODUODENOSCOPY (EGD) WITH PROPOFOL N/A 08/02/2021   Procedure: ESOPHAGOGASTRODUODENOSCOPY (EGD) WITH PROPOFOL;  Surgeon: Milus Banister, MD;  Location: [facility];  Service: Endoscopy;  Laterality: N/A;   ESOPHAGOGASTRODUODENOSCOPY (EGD) WITH PROPOFOL N/A 01/31/2022   Procedure: ESOPHAGOGASTRODUODENOSCOPY (EGD) WITH PROPOFOL;  Surgeon: Milus Banister, MD;  Location: [facility];  Service: Gastroenterology;  Laterality: N/A;   EUS N/A 01/31/2022   Procedure: UPPER ENDOSCOPIC ULTRASOUND (EUS) RADIAL;  Surgeon: Milus Banister, MD;  Location: [facility];  Service: Gastroenterology;  Laterality: N/A;   FINE NEEDLE ASPIRATION N/A 08/02/2021   Procedure: FINE NEEDLE ASPIRATION (FNA) LINEAR;  Surgeon: Milus Banister, MD;  Location: [facility];  Service: Endoscopy;  Laterality: N/A;   FINE  NEEDLE ASPIRATION N/A 01/31/2022   Procedure: FINE NEEDLE ASPIRATION (FNA) LINEAR;  Surgeon: Milus Banister, MD;  Location: [facility];  Service: Gastroenterology;  Laterality: N/A;   left kidney removed     UPPER ESOPHAGEAL ENDOSCOPIC ULTRASOUND (EUS) N/A 08/02/2021   Procedure: UPPER ESOPHAGEAL ENDOSCOPIC ULTRASOUND (EUS);  Surgeon: Milus Banister, MD;  Location: [facility];  Service: Endoscopy;  Laterality: N/A;  Radial and Linear    Family History  Problem Relation Age of Onset   Diabetes Mellitus II Mother    Colon cancer Neg Hx    Esophageal cancer Neg Hx     Prior to Admission medications   Medication Sig Start Date End Date Taking? Authorizing Provider  gabapentin (NEURONTIN) 300 MG capsule Take 1 capsule (  300 mg total) by mouth at bedtime. 11/20/21  Yes Charlott Rakes, MD  levETIRAcetam (KEPPRA) 500 MG tablet Take 1 tablet (500 mg total) by mouth 2 (two) times daily. 11/20/21 06/02/22 Yes Charlott Rakes, MD  metFORMIN (GLUCOPHAGE) 500 MG tablet Take 2 tablets (1,000 mg total) by mouth 2 (two) times daily with a meal. 11/20/21  Yes Charlott Rakes, MD  Multiple Vitamin (MULTIVITAMIN) tablet Take 1 tablet by mouth at bedtime.   Yes [provider]  pantoprazole (PROTONIX) 40 MG tablet Take 1 tablet (40 mg total) by mouth daily. 05/03/22  Yes Vladimir Crofts, PA-C  Potassium 99 MG TABS Take 1 tablet by mouth daily as needed (cramps).   Yes [provider]  atorvastatin (LIPITOR) 20 MG tablet Take 1 tablet (20 mg total) by mouth daily. 05/29/21   Charlott Rakes, MD  ciprofloxacin (CIPRO) 500 MG tablet Take 1 tablet (500 mg total) by mouth 2 (two) times daily. Patient not taking: Reported on 05/05/2022 01/31/22   Milus Banister, MD  lactulose, encephalopathy, (CHRONULAC) 10 GM/15ML SOLN Take 30 mLs (20 g total) by mouth daily. Patient not taking: Reported on 05/05/2022 06/21/21   Levin Erp, PA  naltrexone (DEPADE) 50 MG tablet Take 1 tablet (50  mg total) by mouth daily. Patient not taking: Reported on 05/05/2022 10/03/20   Charlott Rakes, MD  potassium chloride SA (KLOR-CON M) 20 MEQ tablet Take 1 tablet (20 mEq total) by mouth daily. Patient not taking: Reported on 05/05/2022 05/07/21   Petrucelli, Glynda Jaeger, PA-C  predniSONE (DELTASONE) 50 MG tablet Give 13 hours, 7 hours, and 1 hour prior to contrast dye injection.  Notify pharmacy to schedule dose as appropriate based on procedure date/time. 05/03/22   Vladimir Crofts, PA-C    Current Facility-Administered Medications  Medication Dose Route Frequency Provider Last Rate Last Admin   0.9 %  sodium chloride infusion  10 mL/hr Intravenous Once Toy Baker, MD   Held at 05/05/22 2327   0.9 %  sodium chloride infusion   Intravenous Continuous Toy Baker, MD   Stopped at 42/68/34 1962   folic acid (FOLVITE) tablet 1 mg  1 mg Oral Daily Doutova, Nyoka Lint, MD   1 mg at 05/06/22 0941   gabapentin (NEURONTIN) capsule 300 mg  300 mg Oral QHS Toy Baker, MD   300 mg at 05/06/22 0330   HYDROcodone-acetaminophen (NORCO/VICODIN) 5-325 MG per tablet 1-2 tablet  1-2 tablet Oral Q4H PRN Doutova, Nyoka Lint, MD       insulin aspart (novoLOG) injection 0-9 Units  0-9 Units Subcutaneous Q4H Doutova, Anastassia, MD   3 Units at 05/06/22 0829   lactulose (CHRONULAC) 10 GM/15ML solution 20 g  20 g Oral BID Toy Baker, MD   20 g at 05/06/22 0940   levETIRAcetam (KEPPRA) tablet 500 mg  500 mg Oral BID Toy Baker, MD   500 mg at 05/06/22 0941   LORazepam (ATIVAN) tablet 1-4 mg  1-4 mg Oral Q1H PRN Toy Baker, MD       Or   LORazepam (ATIVAN) injection 1-4 mg  1-4 mg Intravenous Q1H PRN Doutova, Anastassia, MD       multivitamin with minerals tablet 1 tablet  1 tablet Oral Daily Doutova, Anastassia, MD   1 tablet at 05/06/22 0941   pantoprazole (PROTONIX) EC tablet 40 mg  40 mg Oral Daily Doutova, Anastassia, MD   40 mg at 05/06/22 0940   thiamine  (VITAMIN B1) tablet 100 mg  100 mg Oral  Daily Toy Baker, MD   100 mg at 05/06/22 7341   Or   thiamine (VITAMIN B1) injection 100 mg  100 mg Intravenous Daily Toy Baker, MD       Current Outpatient Medications  Medication Sig Dispense Refill   gabapentin (NEURONTIN) 300 MG capsule Take 1 capsule (300 mg total) by mouth at bedtime. 30 capsule 3   levETIRAcetam (KEPPRA) 500 MG tablet Take 1 tablet (500 mg total) by mouth 2 (two) times daily. 60 tablet 3   metFORMIN (GLUCOPHAGE) 500 MG tablet Take 2 tablets (1,000 mg total) by mouth 2 (two) times daily with a meal. 120 tablet 3   Multiple Vitamin (MULTIVITAMIN) tablet Take 1 tablet by mouth at bedtime.     pantoprazole (PROTONIX) 40 MG tablet Take 1 tablet (40 mg total) by mouth daily. 30 tablet 3   Potassium 99 MG TABS Take 1 tablet by mouth daily as needed (cramps).     atorvastatin (LIPITOR) 20 MG tablet Take 1 tablet (20 mg total) by mouth daily. 30 tablet 3   ciprofloxacin (CIPRO) 500 MG tablet Take 1 tablet (500 mg total) by mouth 2 (two) times daily. (Patient not taking: Reported on 05/05/2022) 6 tablet 0   lactulose, encephalopathy, (CHRONULAC) 10 GM/15ML SOLN Take 30 mLs (20 g total) by mouth daily. (Patient not taking: Reported on 05/05/2022) 900 mL 3   naltrexone (DEPADE) 50 MG tablet Take 1 tablet (50 mg total) by mouth daily. (Patient not taking: Reported on 05/05/2022) 30 tablet 3   potassium chloride SA (KLOR-CON M) 20 MEQ tablet Take 1 tablet (20 mEq total) by mouth daily. (Patient not taking: Reported on 05/05/2022) 3 tablet 0   predniSONE (DELTASONE) 50 MG tablet Give 13 hours, 7 hours, and 1 hour prior to contrast dye injection.  Notify pharmacy to schedule dose as appropriate based on procedure date/time. 3 tablet 0    Allergies as of 05/05/2022 - Review Complete 05/05/2022  Allergen Reaction Noted   Iohexol Other (See Comments) 05/21/2016    Social History   Socioeconomic History   Marital status:  Single    Spouse name: Not on file   Number of children: Not on file   Years of education: Not on file   Highest education level: Not on file  Occupational History   Not on file  Tobacco Use   Smoking status: Former    Packs/day: 0.10    Years: 25.00    Total pack years: 2.50    Types: Cigarettes    Quit date: 2022    Years since quitting: 1.8   Smokeless tobacco: Never  Vaping Use   Vaping Use: Never used  Substance and Sexual Activity   Alcohol use: Not Currently    Alcohol/week: 3.0 standard drinks of alcohol    Types: 3 Cans of beer per week   Drug use: Not Currently    Types: Marijuana   Sexual activity: Not on file  Other Topics Concern   Not on file  Social History Narrative   Not on file   Social Determinants of Health   Financial Resource Strain: Not on file  Food Insecurity: Not on file  Transportation Needs: Not on file  Physical Activity: Not on file  Stress: Not on file  Social Connections: Not on file  Intimate Partner Violence: Not on file    Review of Systems: All systems reviewed and negative except where noted in HPI.  Physical Exam: Vital signs in last 24 hours: Temp:  [  98 F (36.7 C)-98.4 F (36.9 C)] 98.4 F (36.9 C) (10/30 0812) Pulse Rate:  [95-121] 102 (10/30 0900) Resp:  [5-23] 18 (10/30 0900) BP: (103-142)/(57-106) 142/106 (10/30 0900) SpO2:  [92 %-100 %] 96 % (10/30 0900) Weight:  [63 kg] 63 kg (10/30 0409)    General:  Alert male in NAD Psych: Agitated about EKG cables but cooperative.  Eyes: Pupils equal Ears:  Normal auditory acuity Nose: No deformity, discharge or lesions Neck:  Supple, no masses felt Lungs:  Clear to auscultation.  Heart:  Regular rate, regular rhythm. No lower extremity edema Abdomen:  Soft, nondistended, nontender, active bowel sounds, no masses felt Rectal :  Deferred Msk: Symmetrical without gross deformities.  Neurologic:  Alert, oriented, grossly normal neurologically Skin:  Intact without  significant lesions.    Intake/Output from previous day: 10/29 0701 - 10/30 0700 In: 559.9 [Blood:314; IV Piggyback:245.9] Out: -  Intake/Output this shift:  Total I/O In: 60 [I.V.:60] Out: -    Principal Problem:   Symptomatic anemia Active Problems:   Alcohol abuse   Pancreatic mass   GERD (gastroesophageal reflux disease)   Seizure (HCC)   Alcoholic cirrhosis of liver without ascites (HCC)   Hypomagnesemia   Hypokalemia   Esophageal varices without bleeding ([place])   Type 2 diabetes mellitus ([place])   Hyperlipidemia   HCAP (healthcare-associated pneumonia)    Tye Savoy, NP-C @  05/06/2022, 10:18 AM  --------------------------------------------------------------------------------------------------------------------------  I have taken a history, reviewed the chart and examined the patient. I performed a substantive portion of this encounter, including complete performance of at least one of the key components, in conjunction with the APP. I agree with the APP's note, impression and recommendations  51 year old male with alcoholic cirrhosis, admitted with symptomatic anemia (hgb 5) without overt GI bleeding, although patient reports he did have an episode of small volume hematemesis this morning.  He has chronic problems with epigastric pain and nausea/vomiting.  He has been taking Aleve recently due to foot pain.  He had small esophageal varices and portal hypertensive gastropathy on EGD/EUS in July.  He continues to drink alcohol. A repeat EGD is indicated due to upper GI symptoms and recent hematemesis.  A colonoscopy is also indicated given his iron deficiency anemia and no prior colonoscopy.  The patient is not committed to proceeding with a colonoscopy at this point.  Will plan for EGD tomorrow and then readdress colonoscopy.  The patient was feeling anxious and somewhat tremulous during my exam.  He has a history of alcohol withdrawal/seizures.  - EGD tomorrow  (likely early afternoon) - CLD now, NPO after midnight - IV PPI BID - CIWA protocol, will likely need benzos soon - CBC following transfusion - Readdress colonoscopy tomorrow  Manmeet Arzola E. Candis Schatz, MD [facility]

## 2022-05-06 NOTE — Progress Notes (Signed)
OT Cancellation Note  Patient Details Name: Gary Mitchell MRN: [id number] DOB: [birth year]   Cancelled Treatment:    Reason Eval/Treat Not Completed: Medical issues which prohibited therapy: Pt with Hgb of 4.5 this AM and resting HR of 104. Will hold OT Evaluation for now and monitor.   Julien Girt 05/06/2022, 8:36 AM

## 2022-05-07 ENCOUNTER — Inpatient Hospital Stay (HOSPITAL_COMMUNITY): Payer: 59 | Admitting: Certified Registered Nurse Anesthetist

## 2022-05-07 ENCOUNTER — Encounter (HOSPITAL_COMMUNITY): Payer: Self-pay | Admitting: Internal Medicine

## 2022-05-07 ENCOUNTER — Encounter (HOSPITAL_COMMUNITY)
Admission: EM | Disposition: A | Discharge status: Home / Self Care | Payer: Self-pay | Source: Home / Self Care | Attending: Internal Medicine

## 2022-05-07 DIAGNOSIS — I8510 Secondary esophageal varices without bleeding: Secondary | ICD-10-CM

## 2022-05-07 DIAGNOSIS — K766 Portal hypertension: Secondary | ICD-10-CM | POA: Diagnosis not present

## 2022-05-07 DIAGNOSIS — E119 Type 2 diabetes mellitus without complications: Secondary | ICD-10-CM

## 2022-05-07 DIAGNOSIS — K2289 Other specified disease of esophagus: Secondary | ICD-10-CM

## 2022-05-07 DIAGNOSIS — D509 Iron deficiency anemia, unspecified: Secondary | ICD-10-CM

## 2022-05-07 DIAGNOSIS — K3189 Other diseases of stomach and duodenum: Secondary | ICD-10-CM

## 2022-05-07 DIAGNOSIS — Z7984 Long term (current) use of oral hypoglycemic drugs: Secondary | ICD-10-CM

## 2022-05-07 HISTORY — PX: ESOPHAGOGASTRODUODENOSCOPY (EGD) WITH PROPOFOL: SHX5813

## 2022-05-07 HISTORY — PX: BIOPSY: SHX5522

## 2022-05-07 LAB — COMPREHENSIVE METABOLIC PANEL
ALT: 15 U/L (ref 0–44)
AST: 42 U/L — ABNORMAL HIGH (ref 15–41)
Albumin: 2.9 g/dL — ABNORMAL LOW (ref 3.5–5.0)
Alkaline Phosphatase: 93 U/L (ref 38–126)
Anion gap: 9 (ref 5–15)
BUN: 5 mg/dL — ABNORMAL LOW (ref 6–20)
CO2: 22 mmol/L (ref 22–32)
Calcium: 8.2 mg/dL — ABNORMAL LOW (ref 8.9–10.3)
Chloride: 103 mmol/L (ref 98–111)
Creatinine, Ser: 0.42 mg/dL — ABNORMAL LOW (ref 0.61–1.24)
GFR, Estimated: >60 mL/min (ref >60)
Glucose, Bld: 127 mg/dL — ABNORMAL HIGH (ref 70–99)
Potassium: 3.6 mmol/L (ref 3.5–5.1)
Sodium: 134 mmol/L — ABNORMAL LOW (ref 135–145)
Total Bilirubin: 8.1 mg/dL — ABNORMAL HIGH (ref 0.3–1.2)
Total Protein: 7 g/dL (ref 6.5–8.1)

## 2022-05-07 LAB — CBC WITH DIFFERENTIAL/PLATELET
Abs Immature Granulocytes: 0.01 10*3/uL (ref 0.00–0.07)
Basophils Absolute: 0.1 10*3/uL (ref 0.0–0.1)
Basophils Relative: 1 %
Eosinophils Absolute: 0.1 10*3/uL (ref 0.0–0.5)
Eosinophils Relative: 2 %
HCT: 22.8 % — ABNORMAL LOW (ref 39.0–52.0)
Hemoglobin: 7 g/dL — ABNORMAL LOW (ref 13.0–17.0)
Immature Granulocytes: 0 %
Lymphocytes Relative: 28 %
Lymphs Abs: 1.4 10*3/uL (ref 0.7–4.0)
MCH: 21.7 pg — ABNORMAL LOW (ref 26.0–34.0)
MCHC: 30.7 g/dL (ref 30.0–36.0)
MCV: 70.8 fL — ABNORMAL LOW (ref 80.0–100.0)
Monocytes Absolute: 0.8 10*3/uL (ref 0.1–1.0)
Monocytes Relative: 16 %
Neutro Abs: 2.7 10*3/uL (ref 1.7–7.7)
Neutrophils Relative %: 53 %
Platelets: 129 10*3/uL — ABNORMAL LOW (ref 150–400)
RBC: 3.22 MIL/uL — ABNORMAL LOW (ref 4.22–5.81)
RDW: 30.6 % — ABNORMAL HIGH (ref 11.5–15.5)
WBC: 5 10*3/uL (ref 4.0–10.5)
nRBC: 0 % (ref 0.0–0.2)

## 2022-05-07 LAB — GLUCOSE, CAPILLARY
Glucose-Capillary: 132 mg/dL — ABNORMAL HIGH (ref 70–99)
Glucose-Capillary: 137 mg/dL — ABNORMAL HIGH (ref 70–99)
Glucose-Capillary: 154 mg/dL — ABNORMAL HIGH (ref 70–99)
Glucose-Capillary: 161 mg/dL — ABNORMAL HIGH (ref 70–99)
Glucose-Capillary: 162 mg/dL — ABNORMAL HIGH (ref 70–99)
Glucose-Capillary: 172 mg/dL — ABNORMAL HIGH (ref 70–99)

## 2022-05-07 LAB — PREPARE RBC (CROSSMATCH)

## 2022-05-07 LAB — HEMOGLOBIN AND HEMATOCRIT, BLOOD
HCT: 25.5 % — ABNORMAL LOW (ref 39.0–52.0)
Hemoglobin: 8.1 g/dL — ABNORMAL LOW (ref 13.0–17.0)

## 2022-05-07 LAB — BILIRUBIN, FRACTIONATED(TOT/DIR/INDIR)
Bilirubin, Direct: 5.2 mg/dL — ABNORMAL HIGH (ref 0.0–0.2)
Indirect Bilirubin: 4.9 mg/dL — ABNORMAL HIGH (ref 0.3–0.9)
Total Bilirubin: 10.1 mg/dL — ABNORMAL HIGH (ref 0.3–1.2)

## 2022-05-07 LAB — MAGNESIUM: Magnesium: 1.5 mg/dL — ABNORMAL LOW (ref 1.7–2.4)

## 2022-05-07 SURGERY — ESOPHAGOGASTRODUODENOSCOPY (EGD) WITH PROPOFOL
Anesthesia: Monitor Anesthesia Care

## 2022-05-07 SURGERY — COLONOSCOPY WITH PROPOFOL
Anesthesia: Monitor Anesthesia Care

## 2022-05-07 MED ORDER — SODIUM CHLORIDE 0.9 % IV SOLN: INTRAVENOUS | Status: DC

## 2022-05-07 MED ORDER — PEG-KCL-NACL-NASULF-NA ASC-C 100 G PO SOLR
0.5000 | Freq: Once | ORAL | Status: AC
  Administered 2022-05-07: 100 g via ORAL
  Filled 2022-05-07: qty 1

## 2022-05-07 MED ORDER — SODIUM CHLORIDE 0.9% IV SOLUTION: Freq: Once | INTRAVENOUS | Status: AC

## 2022-05-07 MED ORDER — MAGNESIUM SULFATE 4 GM/100ML IV SOLN
4.0000 g | Freq: Once | INTRAVENOUS | Status: AC
  Administered 2022-05-07: 4 g via INTRAVENOUS
  Filled 2022-05-07: qty 100

## 2022-05-07 MED ORDER — PEG-KCL-NACL-NASULF-NA ASC-C 100 G PO SOLR: 1.0000 | Freq: Once | ORAL | Status: DC

## 2022-05-07 MED ORDER — PROPOFOL 500 MG/50ML IV EMUL
INTRAVENOUS | Status: AC
  Filled 2022-05-07: qty 50

## 2022-05-07 MED ORDER — LIDOCAINE 2% (20 MG/ML) 5 ML SYRINGE
INTRAMUSCULAR | Status: DC | PRN
  Administered 2022-05-07: 100 mg via INTRAVENOUS

## 2022-05-07 MED ORDER — ONDANSETRON HCL 4 MG/2ML IJ SOLN
INTRAMUSCULAR | Status: DC | PRN
  Administered 2022-05-07: 4 mg via INTRAVENOUS

## 2022-05-07 MED ORDER — LACTATED RINGERS IV SOLN: INTRAVENOUS | Status: DC | PRN

## 2022-05-07 MED ORDER — PROPOFOL 500 MG/50ML IV EMUL
INTRAVENOUS | Status: DC | PRN
  Administered 2022-05-07: 100 ug/kg/min via INTRAVENOUS

## 2022-05-07 MED ORDER — DEXMEDETOMIDINE HCL IN NACL 80 MCG/20ML IV SOLN
INTRAVENOUS | Status: DC | PRN
  Administered 2022-05-07: 4 ug via BUCCAL

## 2022-05-07 MED ORDER — MIDAZOLAM HCL 2 MG/2ML IJ SOLN
INTRAMUSCULAR | Status: DC | PRN
  Administered 2022-05-07: 1 mg via INTRAVENOUS

## 2022-05-07 MED ORDER — PEG-KCL-NACL-NASULF-NA ASC-C 100 G PO SOLR
0.5000 | Freq: Once | ORAL | Status: AC
  Administered 2022-05-08: 100 g via ORAL

## 2022-05-07 MED ORDER — PROPOFOL 10 MG/ML IV BOLUS
INTRAVENOUS | Status: DC | PRN
  Administered 2022-05-07: 50 mg via INTRAVENOUS
  Administered 2022-05-07: 20 mg via INTRAVENOUS
  Administered 2022-05-07: 30 mg via INTRAVENOUS

## 2022-05-07 MED ORDER — MIDAZOLAM HCL 2 MG/2ML IJ SOLN
INTRAMUSCULAR | Status: AC
  Filled 2022-05-07: qty 2

## 2022-05-07 SURGICAL SUPPLY — 15 items

## 2022-05-07 NOTE — Progress Notes (Signed)
OT Screen/Cancelled Note  Patient Details Name: Gary Mitchell MRN: [id number] DOB: 03/01/1971   OT Screening:    Reason Eval/Treat Not Completed: Orders received, chart reviewed, spoke with pt and RN. Per pt and RN, pt has been performing simple ADL tasks in room (grooming standing at sink and transferring to 3:1), both report no acute OT needs at this time. Hgb remains low at 7.0. Pt receiving blood. OT screened, no needs identified, will sign off.  Percell Miller Beth Dixon, OTR/L 05/07/2022, 12:01 PM

## 2022-05-07 NOTE — Progress Notes (Signed)
Initial Nutrition Assessment  DOCUMENTATION CODES:   Non-severe (moderate) malnutrition in context of chronic illness  INTERVENTION:  -Advance diet as clinically appropriate -When diet is advanced provide Ensure Max BID (150kcal and 30g protein/bottle) -Provide diet education on follow up-specifically for protein with vegetarian diet -Continue MVI and thiamine, consider b complex on discharge due to restrictive diet  NUTRITION DIAGNOSIS:  Moderate Malnutrition related to chronic illness (alcoholic cirrhosis) as evidenced by energy intake < or equal to 75% for > or equal to 1 month, mild fat depletion, mild muscle depletion.  GOAL:  Patient will meet greater than or equal to 90% of their needs  MONITOR:  Supplement acceptance, Diet advancement, PO intake  REASON FOR ASSESSMENT:  Consult Assessment of nutrition requirement/status  ASSESSMENT:  Pt is a 51yo M with PMH alcoholic cirrhosis, portal hypertension, esophageal varices, anemia, pancreatic lesion, DM II, seizures, GERD and alcoholic hepatitis who presents with critically low Hgb.  Pt visited at bedside this AM. He reports eating only vegetables and grains at home. He refuses all protein foods. Pt reports he was told not to eat beans but was unable to tell RD why. Diet recall includes oatmeal for breakfast, lunch is skipped and greens for dinner. Pt is meeting <50% estimated nutrient needs. Pt also reports an 11% unintended weight loss but is unable to tell RD in what time period he lost this weight. NFPE shows mild fat loss and mild muscle wasting. Pt meets ASPEN criteria for moderate protein calorie malnutrition.  Pt was NPO during RD visit awaiting EGD this afternoon. EGD showed, "1. Grade 1 esophageal varices, 2. Acquired deformity in the pylorus. This is most likely related to the patient's pancreatic cyst 3. Mild portal hypertensive gastropathy. 4. Congested duodenal mucosa." No obvious signs of bleeding found. Pt's diet  advanced to clear liquid this evening, and will be completing bowel prep for colonoscopy tomorrow. When diet is advanced, pt is agreeable to Ensure Max. Recommend ONS at least BID to provide 150kcal and 20g protein/bottle. RD to follow up with diet education prior to discharge.  Medications reviewed and include: folic acid, novolog, lactulose, MVI, protonix, thiamine, magnesium sulfate  Home Vitamins: daily MVI, KCl  Labs reviewed: Na:134, BG:127-232, BUN:5, Cr:0.42, Mg:1.5, B12:279, A1c:8.3 (with Hgb of 5.0)  NUTRITION - FOCUSED PHYSICAL EXAM:  Flowsheet Row Most Recent Value  Orbital Region Mild depletion  Upper Arm Region Mild depletion  Thoracic and Lumbar Region Mild depletion  Buccal Region No depletion  Temple Region Mild depletion  Clavicle Bone Region Mild depletion  Clavicle and Acromion Bone Region No depletion  Scapular Bone Region Unable to assess  Dorsal Hand No depletion  Patellar Region Mild depletion  Anterior Thigh Region Unable to assess  [wearing pajama pants, calf and knee assessed, unable to see thigh]  Posterior Calf Region Mild depletion  Hair Reviewed  Eyes Reviewed  Mouth Reviewed  Skin Reviewed  [dry]  Nails Reviewed       Diet Order:   Diet Order             Diet NPO time specified Except for: Sips with Meds  Diet effective midnight           Diet clear liquid Room service appropriate? Yes; Fluid consistency: Thin  Diet effective now                   EDUCATION NEEDS:   Education needs have been addressed  Skin:  Skin Assessment: Reviewed RN Assessment  Last BM:  10/30  Height:  Ht Readings from Last 1 Encounters:  05/07/22 '5\' 4"'$  (1.626 m)    Weight:  Wt Readings from Last 1 Encounters:  05/07/22 63 kg    BMI:  Body mass index is 23.84 kg/m.  Estimated Nutritional Needs:   Kcal:  1890-2205kcal  Protein:  75-95g  Fluid:  1873m  KCandise Bowens MS, RD, LDN, CNSC See AMiON for contact information

## 2022-05-07 NOTE — Anesthesia Preprocedure Evaluation (Signed)
Anesthesia Evaluation  Patient identified by MRN, date of birth, ID band Patient awake    Reviewed: Allergy & Precautions, NPO status , Patient's Chart, lab work & pertinent test results  Airway Mallampati: II  TM Distance: >3 FB Neck ROM: Full    Dental  (+) Poor Dentition   Pulmonary former smoker,    Pulmonary exam normal        Cardiovascular negative cardio ROS   Rhythm:Regular Rate:Normal     Neuro/Psych Seizures -,  CVA negative psych ROS   GI/Hepatic GERD  Medicated,(+) Cirrhosis       , Hepatitis -, Toxin Related  Endo/Other  diabetes, Type 2, Oral Hypoglycemic Agents  Renal/GU Renal disease     Musculoskeletal   Abdominal Normal abdominal exam  (+)   Peds  Hematology   Anesthesia Other Findings   Reproductive/Obstetrics                             Anesthesia Physical Anesthesia Plan  ASA: 3  Anesthesia Plan: MAC   Post-op Pain Management:    Induction: Intravenous  PONV Risk Score and Plan: 0  Airway Management Planned:   Additional Equipment: None  Intra-op Plan:   Post-operative Plan:   Informed Consent: I have reviewed the patients History and Physical, chart, labs and discussed the procedure including the risks, benefits and alternatives for the proposed anesthesia with the patient or authorized representative who has indicated his/her understanding and acceptance.       Plan Discussed with: CRNA  Anesthesia Plan Comments: (Transfusing 1U RBC)        Anesthesia Quick Evaluation

## 2022-05-07 NOTE — Op Note (Signed)
[facility] Patient Name: Gary Mitchell Procedure Date: 05/07/2022 MRN: [id number] Attending MD: Gladstone Pih. Candis Schatz , MD, [id number] Date of Birth: Mar 17, 1971 CSN: [id number] Age: 51 Admit Type: Inpatient Procedure:                Upper GI endoscopy Indications:              Unexplained iron deficiency anemia, presented with                            symptomatic anemia (hgb 5, without overt GI                            bleeding, except for a single episode of scant                            hematemesis after his arrival to ED yesterday) Providers:                Gladstone Pih. Candis Schatz, MD, Baird Cancer, RN,                            William Dalton, Technician, Brien Mates, RNFA Referring MD:              Medicines:                Monitored Anesthesia Care Complications:            No immediate complications. Estimated Blood Loss:     Estimated blood loss was minimal. Procedure:                Pre-Anesthesia Assessment:                           - Prior to the procedure, a History and Physical                            was performed, and patient medications and                            allergies were reviewed. The patient's tolerance of                            previous anesthesia was also reviewed. The risks                            and benefits of the procedure and the sedation                            options and risks were discussed with the patient.                            All questions were answered, and informed consent                            was obtained. Prior Anticoagulants: The patient has  taken no anticoagulant or antiplatelet agents. ASA                            Grade Assessment: III - A patient with severe                            systemic disease. After reviewing the risks and                            benefits, the patient was deemed in satisfactory                            condition to undergo  the procedure.                           After obtaining informed consent, the endoscope was                            passed under direct vision. Throughout the                            procedure, the patient's blood pressure, pulse, and                            oxygen saturations were monitored continuously. The                            [id number]-H190 ([id number]) Olympus endoscope was introduced                            through the mouth, and advanced to the third part                            of duodenum. The upper GI endoscopy was                            accomplished without difficulty. The patient                            tolerated the procedure well. Scope In: Scope Out: Findings:      Grade I varices were found in the lower third of the esophagus. They       were small in size.      The Z-line was irregular.      The exam of the esophagus was otherwise normal.      A deformity was found at the pylorus.      Mild portal hypertensive gastropathy was found in the gastric body.      Biopsies were taken with a cold forceps in the gastric body and in the       gastric antrum for Helicobacter pylori testing. Estimated blood loss was       minimal.      Diffuse mildly congested mucosa without active bleeding and with no       stigmata of bleeding was found in the duodenal bulb and in the second  portion of the duodenum. Biopsies for histology were taken with a cold       forceps for evaluation of celiac disease. Estimated blood loss was       minimal. Impression:               - Grade I esophageal varices.                           - Z-line irregular.                           - Acquired deformity in the pylorus. This is most                            likely related to the patient's pancreatic cyst                           - Mild portal hypertensive gastropathy.                           - Congested duodenal mucosa. Biopsied.                           - Biopsies were  taken with a cold forceps for                            Helicobacter pylori testing.                           - No obvious sources of chronic GI blood loss to                            explain hemoglobin of 5. Although PHG present, it                            is quite mild and not likely to cause significant                            oozing.                           - As patient has never had a colonoscopy, recommend                            evaluating for lower source of GI blood loss                            tomorrow. Moderate Sedation:      Not Applicable - Patient had care per Anesthesia. Recommendation:           - Return patient to hospital ward for ongoing care.                           - Clear liquid diet today. Bowel prep this evening.                           -  Continue present medications.                           - Await pathology results.                           - Perform a colonoscopy tomorrow. Procedure Code(s):        --- Professional ---                           [phone], Esophagogastroduodenoscopy, flexible,                            transoral; with biopsy, single or multiple Diagnosis Code(s):        --- Professional ---                           I85.00, Esophageal varices without bleeding                           K22.89, Other specified disease of esophagus                           K31.89, Other diseases of stomach and duodenum                           K76.6, Portal hypertension                           D50.9, Iron deficiency anemia, unspecified CPT copyright 2022 American Medical Association. All rights reserved. The codes documented in this report are preliminary and upon coder review may  be revised to meet current compliance requirements. Marzell Allemand E. Candis Schatz, MD 05/07/2022 2:36:03 PM This report has been signed electronically. Number of Addenda: 0

## 2022-05-07 NOTE — Progress Notes (Signed)
Progress Note    SARGON SCOUTEN   AYT:[id number]  DOB: 11/01/1970  DOA: 05/05/2022     1 PCP: Charlott Rakes, MD  Initial CC: low Hgb  Hospital Course: Mr. 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 He had outpatient lab work checked and was found to have critical low hemoglobin and was referred to the ER. On work-up he was found to have hemoglobin 5 g/dL and was ordered 2 units PRBC. GI was also consulted on admission.  Interval History:  Patient had episode of hematemesis yesterday after seen by myself and GI.  No further episodes.  He states it was a very small amount.  Stools remain brown in color per patient.  Plan is for EGD today.  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, from recurrent pancreatitis with alcohol use - continue following with GI  DM II - continue SSI and CBG monitoring   HTN - trend BP for now - can use PRN meds  Hx seizures  - continue keppra  GERD - continue protonix   Hypokalemia - Replete as needed   Old records reviewed in assessment of this patient  Antimicrobials:   DVT prophylaxis:  SCDs Start: 05/06/22 0308   Code Status:   Code Status:  Full Code  Mobility Assessment (last 72 hours)     Mobility Assessment     Row Name 05/06/22 1945 05/06/22 12:14:19         Does patient have an order for bedrest or is patient medically unstable No - Continue assessment No - Continue assessment      What is the highest level of mobility based on the progressive mobility assessment? Level 5 (Walks with assist in room/hall) - Balance while stepping forward/back and can walk in room with assist - Complete Level 5 (Walks with assist in room/hall) - Balance while stepping forward/back and can walk in room with assist - Complete               Barriers to discharge:  Disposition Plan:  Home Status is: Inpt  Objective: Blood pressure (!) 162/100, pulse 90, temperature 98.5 F (36.9 C), temperature source Oral, resp. rate 15, height '5\' 4"'$  (1.626 m), weight 63 kg, SpO2 97 %.  Examination:  Physical Exam Constitutional:      General: He is not in acute distress.    Appearance: Normal appearance.  HENT:     Head: Normocephalic and atraumatic.     Mouth/Throat:     Mouth: Mucous membranes are moist.  Eyes:     Extraocular Movements: Extraocular movements intact.  Cardiovascular:     Rate and Rhythm: Normal rate and regular rhythm.     Heart sounds: Normal heart sounds.  Pulmonary:  Effort: Pulmonary effort is normal. No respiratory distress.     Breath sounds: Normal breath sounds. No wheezing.  Abdominal:     General: Bowel sounds are normal. There is no distension.     Palpations: Abdomen is soft.     Tenderness: There is no abdominal tenderness.  Musculoskeletal:        General: Normal range of motion.     Cervical back: Normal range of motion and neck supple.  Skin:    General: Skin is warm and dry.  Neurological:     General: No focal deficit present.     Mental Status: He is alert.  Psychiatric:        Mood and Affect: Mood normal.        Behavior: Behavior normal.      Consultants:  GI  Procedures:   10/31, EGD  Data Reviewed: Results for orders placed or performed during the hospital encounter of 05/05/22 (from the past 24 hour(s))  BLOOD TRANSFUSION REPORT - SCANNED     Status: None   Collection Time: 05/06/22  2:28 PM   Narrative   Ordered by an unspecified provider.  Hemoglobin and hematocrit, blood     Status: Abnormal   Collection Time: 05/06/22  2:55 PM  Result Value Ref Range   Hemoglobin 7.2 (L) 13.0 - 17.0 g/dL   HCT 23.5 (L) 39.0 - 52.0 %  Prealbumin     Status: Abnormal   Collection Time: 05/06/22  4:42 PM  Result Value Ref Range   Prealbumin 14 (L) 18 - 38 mg/dL  Glucose, capillary     Status: Abnormal   Collection Time: 05/06/22  8:00 PM  Result Value Ref Range   Glucose-Capillary 232 (H) 70 - 99 mg/dL  Glucose, capillary     Status: Abnormal   Collection Time: 05/06/22 11:47 PM  Result Value Ref Range   Glucose-Capillary 128 (H) 70 - 99 mg/dL  CBC with Differential/Platelet     Status: Abnormal   Collection Time: 05/07/22  5:01 AM  Result Value Ref Range   WBC 5.0 4.0 - 10.5 K/uL   RBC 3.22 (L) 4.22 - 5.81 MIL/uL   Hemoglobin 7.0 (L) 13.0 - 17.0 g/dL   HCT 22.8 (L) 39.0 - 52.0 %   MCV 70.8 (L) 80.0 - 100.0 fL   MCH 21.7 (L) 26.0 - 34.0 pg   MCHC 30.7 30.0 - 36.0 g/dL   RDW 30.6 (H) 11.5 - 15.5 %   Platelets 129 (L) 150 - 400 K/uL   nRBC 0.0 0.0 - 0.2 %   Neutrophils Relative % 53 %   Neutro Abs 2.7 1.7 - 7.7 K/uL   Lymphocytes Relative 28 %   Lymphs Abs 1.4 0.7 - 4.0 K/uL   Monocytes Relative 16 %   Monocytes Absolute 0.8 0.1 - 1.0 K/uL   Eosinophils Relative 2 %   Eosinophils Absolute 0.1 0.0 - 0.5 K/uL   Basophils Relative 1 %   Basophils Absolute 0.1 0.0 - 0.1 K/uL   Immature Granulocytes 0 %   Abs Immature Granulocytes 0.01 0.00 - 0.07 K/uL   Schistocytes PRESENT   Comprehensive metabolic panel     Status: Abnormal   Collection Time: 05/07/22  5:01 AM  Result Value Ref Range   Sodium 134 (L) 135 - 145 mmol/L   Potassium 3.6 3.5 - 5.1  mmol/L   Chloride 103 98 - 111 mmol/L   CO2 22 22 - 32 mmol/L   Glucose, Bld 127 (  H) 70 - 99 mg/dL   BUN 5 (L) 6 - 20 mg/dL   Creatinine, Ser 0.42 (L) 0.61 - 1.24 mg/dL   Calcium 8.2 (L) 8.9 - 10.3 mg/dL   Total Protein 7.0 6.5 - 8.1 g/dL   Albumin 2.9 (L) 3.5 - 5.0 g/dL   AST 42 (H) 15 - 41 U/L   ALT 15 0 - 44 U/L   Alkaline Phosphatase 93 38 - 126 U/L   Total Bilirubin 8.1 (H) 0.3 - 1.2 mg/dL   GFR, Estimated >60 >60 mL/min   Anion gap 9 5 - 15  Magnesium     Status: Abnormal   Collection Time: 05/07/22  5:01 AM  Result Value Ref Range   Magnesium 1.5 (L) 1.7 - 2.4 mg/dL  Glucose, capillary     Status: Abnormal   Collection Time: 05/07/22  5:09 AM  Result Value Ref Range   Glucose-Capillary 132 (H) 70 - 99 mg/dL  Prepare RBC (crossmatch)     Status: None   Collection Time: 05/07/22  7:44 AM  Result Value Ref Range   Order Confirmation      ORDER PROCESSED BY BLOOD BANK Performed at [facility], [place] [address], [place], [place] [address]   Glucose, capillary     Status: Abnormal   Collection Time: 05/07/22  8:37 AM  Result Value Ref Range   Glucose-Capillary 154 (H) 70 - 99 mg/dL  Bilirubin, fractionated(tot/dir/indir)     Status: Abnormal   Collection Time: 05/07/22 10:40 AM  Result Value Ref Range   Total Bilirubin 10.1 (H) 0.3 - 1.2 mg/dL   Bilirubin, Direct 5.2 (H) 0.0 - 0.2 mg/dL   Indirect Bilirubin 4.9 (H) 0.3 - 0.9 mg/dL  Glucose, capillary     Status: Abnormal   Collection Time: 05/07/22 12:13 PM  Result Value Ref Range   Glucose-Capillary 172 (H) 70 - 99 mg/dL    I have Reviewed nursing notes, Vitals, and Lab results since pt's last encounter. Pertinent lab results : see above I have ordered test including BMP, CBC, Mg I have reviewed the last note from staff over past 24 hours I have discussed pt's care plan and test results with nursing staff, case manager   LOS: 1 day   Dwyane Dee, MD [facility] 05/07/2022, 2:23 PM

## 2022-05-07 NOTE — Progress Notes (Cosign Needed Addendum)
      51 yo male with Etoh cirrhosis admitted with symptomatic anemia ( hgb 5). See 10/30 consult note. Still drinking Etoh. No overt GI bleeding at home but starting having small volume hematemesis yesterday after I saw him in the ED.  Hgb improved from 4.5 to 7 after blood transfusion x2 and also got IV iron. VSS. Today's lab's reviewed. Renal function normal. Mg+ is low ( repletion in progress). K+ okay at 3.6. Tbili rose from 3.1 to 8.1. No evidence for Etoh withdrawal.  For EGD today Fractionate bilirubin Continue lactuose Continue BID IV PPI    I have taken a history, reviewed the chart and examined the patient. I performed a substantive portion of this encounter, including complete performance of at least one of the key components, in conjunction with the APP. I agree with the APP's note, impression and recommendations  EGD unrevealing for obvious source of anemia or blood loss.  Very mild portal hypertensive gastropathy and very small varices.  Patient planned for colonoscopy November 1. Increase in bilirubin levels unlikely to be hepatic given very brisk change with no change in his other liver enzymes (aminotransferases, alk phos).  He is not having any symptoms suggestive of biliary obstruction.  Given that the bilirubin jumped after his blood transfusion, wonder whether patient may have had some hemolytic reaction to his transfusions.  Scott E. Candis Schatz, MD [facility]

## 2022-05-07 NOTE — Anesthesia Postprocedure Evaluation (Signed)
Anesthesia Post Note  Patient: Gary Mitchell  Procedure(s) Performed: ESOPHAGOGASTRODUODENOSCOPY (EGD) WITH PROPOFOL BIOPSY     Patient location during evaluation: PACU Anesthesia Type: MAC Level of consciousness: awake and alert Pain management: pain level controlled Vital Signs Assessment: post-procedure vital signs reviewed and stable Respiratory status: spontaneous breathing, nonlabored ventilation, respiratory function stable and patient connected to nasal cannula oxygen Cardiovascular status: stable and blood pressure returned to baseline Postop Assessment: no apparent nausea or vomiting Anesthetic complications: no   No notable events documented.  Last Vitals:  Vitals:   05/07/22 1439 05/07/22 1449  BP: 123/78 (!) 137/90  Pulse: 87 91  Resp: 17 15  Temp:    SpO2: 100% 99%    Last Pain:  Vitals:   05/07/22 1449  TempSrc:   PainSc: 0-No pain                 Effie Berkshire

## 2022-05-07 NOTE — Interval H&P Note (Signed)
History and Physical Interval Note:  05/07/2022 2:00 PM  Gary Mitchell  has presented today for surgery, with the diagnosis of anemia.  The various methods of treatment have been discussed with the patient and family. After consideration of risks, benefits and other options for treatment, the patient has consented to  Procedure(s): ESOPHAGOGASTRODUODENOSCOPY (EGD) WITH PROPOFOL (N/A) as a surgical intervention.  The patient's history has been reviewed, patient examined, no change in status, stable for surgery.  I have reviewed the patient's chart and labs.  Questions were answered to the patient's satisfaction.     [name]

## 2022-05-07 NOTE — Transfer of Care (Signed)
Immediate Anesthesia Transfer of Care Note  Patient: Gary Mitchell  Procedure(s) Performed: Procedure(s): ESOPHAGOGASTRODUODENOSCOPY (EGD) WITH PROPOFOL (N/A) BIOPSY  Patient Location: PACU  Anesthesia Type:MAC  Level of Consciousness: Patient easily awoken, sedated, comfortable, cooperative, following commands, responds to stimulation.   Airway & Oxygen Therapy: Patient spontaneously breathing, ventilating well, oxygen via simple oxygen mask.  Post-op Assessment: Report given to PACU RN, vital signs reviewed and stable, moving all extremities.   Post vital signs: Reviewed and stable.  Complications: No apparent anesthesia complications  Last Vitals:  Vitals Value Taken Time  BP 128/88 05/07/22 1430  Temp 36.9 C 05/07/22 1429  Pulse 89 05/07/22 1433  Resp 19 05/07/22 1433  SpO2 100 % 05/07/22 1433  Vitals shown include unvalidated device data.  Last Pain:  Vitals:   05/07/22 1429  TempSrc: Tympanic  PainSc: Asleep         Complications: No notable events documented.

## 2022-05-07 NOTE — Progress Notes (Signed)
PT Cancellation Note / Screen  Patient Details Name: Gary Mitchell MRN: [id number] DOB: 12/22/1970   Cancelled Treatment:    Reason Eval/Treat Not Completed: PT screened, no needs identified, will sign off Pt up moving around room ad lib.  RN reports no concerns with mobility at this time.  PT to sign off.  Please re-order if new needs arise.   Myrtis Hopping [place] 05/07/2022, 4:03 PM Arlyce Dice, DPT Physical Therapist [facility] Preferred contact method: Secure Chat Weekend Pager Only: [phone] Office: [phone]

## 2022-05-07 NOTE — Anesthesia Procedure Notes (Signed)
Procedure Name: MAC Date/Time: 05/07/2022 2:10 PM  Performed by: Deliah Boston, CRNAPre-anesthesia Checklist: Patient identified, Emergency Drugs available, Suction available and Patient being monitored Patient Re-evaluated:Patient Re-evaluated prior to induction Oxygen Delivery Method: Simple face mask Placement Confirmation: positive ETCO2 and breath sounds checked- equal and bilateral

## 2022-05-08 ENCOUNTER — Inpatient Hospital Stay (HOSPITAL_COMMUNITY): Payer: 59 | Admitting: Anesthesiology

## 2022-05-08 ENCOUNTER — Ambulatory Visit (HOSPITAL_COMMUNITY)
Admission: RE | Admit: 2022-05-08 | Discharge: 2022-05-08 | Disposition: A | Discharge status: Home / Self Care | Payer: 59 | Source: Ambulatory Visit | Attending: Physician Assistant | Admitting: Physician Assistant

## 2022-05-08 ENCOUNTER — Encounter (HOSPITAL_COMMUNITY)
Admission: EM | Disposition: A | Discharge status: Home / Self Care | Payer: Self-pay | Source: Home / Self Care | Attending: Internal Medicine

## 2022-05-08 ENCOUNTER — Encounter (HOSPITAL_COMMUNITY): Payer: Self-pay | Admitting: Gastroenterology

## 2022-05-08 DIAGNOSIS — D509 Iron deficiency anemia, unspecified: Secondary | ICD-10-CM

## 2022-05-08 DIAGNOSIS — E119 Type 2 diabetes mellitus without complications: Secondary | ICD-10-CM

## 2022-05-08 DIAGNOSIS — E440 Moderate protein-calorie malnutrition: Secondary | ICD-10-CM | POA: Insufficient documentation

## 2022-05-08 DIAGNOSIS — Z7984 Long term (current) use of oral hypoglycemic drugs: Secondary | ICD-10-CM

## 2022-05-08 DIAGNOSIS — D124 Benign neoplasm of descending colon: Secondary | ICD-10-CM

## 2022-05-08 DIAGNOSIS — K922 Gastrointestinal hemorrhage, unspecified: Secondary | ICD-10-CM

## 2022-05-08 HISTORY — PX: HEMOSTASIS CLIP PLACEMENT: SHX6857

## 2022-05-08 HISTORY — PX: POLYPECTOMY: SHX5525

## 2022-05-08 HISTORY — PX: COLONOSCOPY WITH PROPOFOL: SHX5780

## 2022-05-08 LAB — CBC
HCT: 26.1 % — ABNORMAL LOW (ref 39.0–52.0)
Hemoglobin: 8.1 g/dL — ABNORMAL LOW (ref 13.0–17.0)
MCH: 22.6 pg — ABNORMAL LOW (ref 26.0–34.0)
MCHC: 31 g/dL (ref 30.0–36.0)
MCV: 72.7 fL — ABNORMAL LOW (ref 80.0–100.0)
Platelets: 122 10*3/uL — ABNORMAL LOW (ref 150–400)
RBC: 3.59 MIL/uL — ABNORMAL LOW (ref 4.22–5.81)
RDW: 30 % — ABNORMAL HIGH (ref 11.5–15.5)
WBC: 5.7 10*3/uL (ref 4.0–10.5)
nRBC: 0 % (ref 0.0–0.2)

## 2022-05-08 LAB — COMPREHENSIVE METABOLIC PANEL
ALT: 13 U/L (ref 0–44)
AST: 37 U/L (ref 15–41)
Albumin: 3.2 g/dL — ABNORMAL LOW (ref 3.5–5.0)
Alkaline Phosphatase: 96 U/L (ref 38–126)
Anion gap: 10 (ref 5–15)
BUN: 7 mg/dL (ref 6–20)
CO2: 19 mmol/L — ABNORMAL LOW (ref 22–32)
Calcium: 8.5 mg/dL — ABNORMAL LOW (ref 8.9–10.3)
Chloride: 104 mmol/L (ref 98–111)
Creatinine, Ser: 0.39 mg/dL — ABNORMAL LOW (ref 0.61–1.24)
GFR, Estimated: >60 mL/min (ref >60)
Glucose, Bld: 139 mg/dL — ABNORMAL HIGH (ref 70–99)
Potassium: 3.6 mmol/L (ref 3.5–5.1)
Sodium: 133 mmol/L — ABNORMAL LOW (ref 135–145)
Total Bilirubin: 11.5 mg/dL — ABNORMAL HIGH (ref 0.3–1.2)
Total Protein: 7.5 g/dL (ref 6.5–8.1)

## 2022-05-08 LAB — CBC WITH DIFFERENTIAL/PLATELET
Abs Immature Granulocytes: 0.03 10*3/uL (ref 0.00–0.07)
Basophils Absolute: 0.1 10*3/uL (ref 0.0–0.1)
Basophils Relative: 1 %
Eosinophils Absolute: 0.1 10*3/uL (ref 0.0–0.5)
Eosinophils Relative: 2 %
HCT: 23.2 % — ABNORMAL LOW (ref 39.0–52.0)
Hemoglobin: 7.3 g/dL — ABNORMAL LOW (ref 13.0–17.0)
Immature Granulocytes: 1 %
Lymphocytes Relative: 20 %
Lymphs Abs: 1.2 10*3/uL (ref 0.7–4.0)
MCH: 22.7 pg — ABNORMAL LOW (ref 26.0–34.0)
MCHC: 31.5 g/dL (ref 30.0–36.0)
MCV: 72.3 fL — ABNORMAL LOW (ref 80.0–100.0)
Monocytes Absolute: 1 10*3/uL (ref 0.1–1.0)
Monocytes Relative: 18 %
Neutro Abs: 3.4 10*3/uL (ref 1.7–7.7)
Neutrophils Relative %: 58 %
Platelets: 111 10*3/uL — ABNORMAL LOW (ref 150–400)
RBC: 3.21 MIL/uL — ABNORMAL LOW (ref 4.22–5.81)
RDW: 30.3 % — ABNORMAL HIGH (ref 11.5–15.5)
WBC: 5.8 10*3/uL (ref 4.0–10.5)
nRBC: 0 % (ref 0.0–0.2)

## 2022-05-08 LAB — HEMOGLOBIN AND HEMATOCRIT, BLOOD
HCT: 26.4 % — ABNORMAL LOW (ref 39.0–52.0)
HCT: 25.3 % — ABNORMAL LOW (ref 39.0–52.0)
Hemoglobin: 8 g/dL — ABNORMAL LOW (ref 13.0–17.0)
Hemoglobin: 7.9 g/dL — ABNORMAL LOW (ref 13.0–17.0)

## 2022-05-08 LAB — GLUCOSE, CAPILLARY
Glucose-Capillary: 127 mg/dL — ABNORMAL HIGH (ref 70–99)
Glucose-Capillary: 150 mg/dL — ABNORMAL HIGH (ref 70–99)
Glucose-Capillary: 150 mg/dL — ABNORMAL HIGH (ref 70–99)
Glucose-Capillary: 131 mg/dL — ABNORMAL HIGH (ref 70–99)
Glucose-Capillary: 151 mg/dL — ABNORMAL HIGH (ref 70–99)

## 2022-05-08 LAB — MAGNESIUM: Magnesium: 1.6 mg/dL — ABNORMAL LOW (ref 1.7–2.4)

## 2022-05-08 LAB — PREPARE RBC (CROSSMATCH)

## 2022-05-08 LAB — LACTATE DEHYDROGENASE: LDH: 109 U/L (ref 98–192)

## 2022-05-08 SURGERY — COLONOSCOPY
Anesthesia: Monitor Anesthesia Care

## 2022-05-08 SURGERY — COLONOSCOPY WITH PROPOFOL
Anesthesia: Monitor Anesthesia Care

## 2022-05-08 MED ORDER — MAGNESIUM SULFATE 2 GM/50ML IV SOLN
2.0000 g | Freq: Once | INTRAVENOUS | Status: AC
  Administered 2022-05-08: 2 g via INTRAVENOUS
  Filled 2022-05-08: qty 50

## 2022-05-08 MED ORDER — PREDNISONE 50 MG PO TABS
50.0000 mg | ORAL_TABLET | Freq: Four times a day (QID) | ORAL | Status: AC
  Administered 2022-05-08 – 2022-05-09 (×3): 50 mg via ORAL
  Filled 2022-05-08 (×3): qty 1

## 2022-05-08 MED ORDER — DIPHENHYDRAMINE HCL 50 MG/ML IJ SOLN
50.0000 mg | Freq: Once | INTRAMUSCULAR | Status: AC
  Administered 2022-05-09: 50 mg via INTRAVENOUS
  Filled 2022-05-08: qty 1

## 2022-05-08 MED ORDER — PROPOFOL 500 MG/50ML IV EMUL
INTRAVENOUS | Status: DC | PRN
  Administered 2022-05-08: 50 mg via INTRAVENOUS
  Administered 2022-05-08: 100 ug/kg/min via INTRAVENOUS

## 2022-05-08 MED ORDER — SODIUM CHLORIDE 0.9% IV SOLUTION: Freq: Once | INTRAVENOUS | Status: AC

## 2022-05-08 MED ORDER — LACTATED RINGERS IV SOLN: INTRAVENOUS | Status: DC

## 2022-05-08 MED ORDER — DIPHENHYDRAMINE HCL 50 MG/ML IJ SOLN: 50.0000 mg | Freq: Once | INTRAMUSCULAR | Status: DC

## 2022-05-08 MED ORDER — DIPHENHYDRAMINE HCL 50 MG PO CAPS
50.0000 mg | ORAL_CAPSULE | Freq: Once | ORAL | Status: AC
  Filled 2022-05-08: qty 1

## 2022-05-08 MED ORDER — DIPHENHYDRAMINE HCL 50 MG PO CAPS: 50.0000 mg | ORAL_CAPSULE | Freq: Once | ORAL | Status: DC

## 2022-05-08 MED ORDER — PREDNISONE 50 MG PO TABS: 50.0000 mg | ORAL_TABLET | Freq: Four times a day (QID) | ORAL | Status: DC

## 2022-05-08 SURGICAL SUPPLY — 22 items

## 2022-05-08 NOTE — TOC Initial Note (Signed)
Transition of Care [facility]) - Initial/Assessment Note    Patient Details  Name: Gary Mitchell MRN: [id number] Date of Birth: [birth year]  Transition of [facility]) CM/SW Contact:    Dessa Phi, RN Phone Number: 05/08/2022, 3:42 PM  Clinical Narrative: patient declines etoh resources. No further CM needs.                  Expected Discharge Plan: Home/Self Care Barriers to Discharge: Continued Medical Work up   Patient Goals and CMS Choice Patient states their goals for this hospitalization and ongoing recovery are::  (Home) CMS Medicare.gov Compare Post Acute Care list provided to:: Patient Choice offered to / list presented to : Patient  Expected Discharge Plan and Services Expected Discharge Plan: Home/Self Care   Discharge Planning Services: CM Consult   Living arrangements for the past 2 months: Single Family Home                                      Prior Living Arrangements/Services Living arrangements for the past 2 months: Single Family Home Lives with:: Self Patient language and need for interpreter reviewed:: Yes Do you feel safe going back to the place where you live?: Yes      Need for Family Participation in Patient Care: Yes (Comment) Care giver support system in place?: Yes (comment)   Criminal Activity/Legal Involvement Pertinent to Current Situation/Hospitalization: No - Comment as needed  Activities of Daily Living Home Assistive Devices/Equipment: Eyeglasses (reading glasses) ADL Screening (condition at time of admission) Patient's cognitive ability adequate to safely complete daily activities?: Yes Is the patient deaf or have difficulty hearing?: No Does the patient have difficulty seeing, even when wearing glasses/contacts?: No Does the patient have difficulty concentrating, remembering, or making decisions?: No Patient able to express need for assistance with ADLs?: Yes Does the patient have difficulty dressing or bathing?:  No Independently performs ADLs?: Yes (appropriate for developmental age) Does the patient have difficulty walking or climbing stairs?: Yes Weakness of Legs: Both Weakness of Arms/Hands: Both  Permission Sought/Granted Permission sought to share information with : Case Manager Permission granted to share information with : Yes, Verbal Permission Granted  Share Information with NAME:  (Case manager)           Emotional Assessment Appearance:: Appears stated age Attitude/Demeanor/Rapport: Gracious Affect (typically observed): Accepting Orientation: : Oriented to Self, Oriented to Place, Oriented to  Time, Oriented to Situation Alcohol / Substance Use: Alcohol Use Psych Involvement: No (comment)  Admission diagnosis:  Alcoholic cirrhosis of liver without ascites (HCC) [K70.30] HCAP (healthcare-associated pneumonia) [J18.9] Symptomatic anemia [D64.9] Alcohol dependence with unspecified alcohol-induced disorder ([place]) [F10.29] Patient Active Problem List   Diagnosis Date Noted   Malnutrition of moderate degree 05/08/2022   Gastrointestinal hemorrhage    Benign neoplasm of descending colon    HCAP (healthcare-associated pneumonia) 05/06/2022   Symptomatic anemia 05/05/2022   Hyperlipidemia 05/05/2022   Hyperammonemia ([place]) 05/03/2022   Type 2 diabetes mellitus ([place]) 05/29/2021   Portal hypertension (HCC)    Esophageal varices without bleeding (HCC)    Acute blood loss anemia    Seizure disorder ([place]) 50/03/3817   Alcoholic cirrhosis of liver without ascites ([place]) 09/17/2020   Acute hepatic encephalopathy ([place]) 09/17/2020   Hypomagnesemia 09/17/2020   Suspected CVA (cerebral vascular accident) ([place]) 09/17/2020   Hypokalemia 09/17/2020   Seizures ([place]) 07/09/2020   High bilirubin 07/09/2020  Anemia 07/09/2020   Thrombocytopenia ([place]) 07/09/2020   COVID-19 virus infection 07/09/2020   GERD (gastroesophageal reflux disease) 07/09/2020   Pancreatic mass 11/29/2019   Acute  pancreatitis 16/01/3709   Alcoholic hepatitis 62/69/4854   Gallstones 11/23/2019   Alcohol abuse 11/23/2019   PCP:  Charlott Rakes, MD Pharmacy:   [facility], [place] [address] Phone: [phone] Fax: [phone]  [id number] [place] [address]: [phone]: [phone] [facility] [address]: [phone]: [address]Phone: [phone]: [phone]    Social Determinants of Health (SDOH) Interventions    Readmission Risk Interventions    11/24/2019   10:34 AM  Readmission Risk Prevention Plan  Post Dischage Appt Complete  Medication Screening Complete  Transportation Screening Complete

## 2022-05-08 NOTE — Anesthesia Postprocedure Evaluation (Signed)
Anesthesia Post Note  Patient: Gary Mitchell  Procedure(s) Performed: COLONOSCOPY WITH PROPOFOL POLYPECTOMY HEMOSTASIS CLIP PLACEMENT     Patient location during evaluation: Endoscopy Anesthesia Type: MAC Level of consciousness: awake and alert Pain management: pain level controlled Vital Signs Assessment: post-procedure vital signs reviewed and stable Respiratory status: spontaneous breathing, nonlabored ventilation and respiratory function stable Cardiovascular status: stable and blood pressure returned to baseline Postop Assessment: no apparent nausea or vomiting Anesthetic complications: no   No notable events documented.  Last Vitals:  Vitals:   05/08/22 1450 05/08/22 1500  BP: (!) 144/95 (!) 145/94  Pulse: 95 93  Resp: 16 16  Temp:    SpO2: 98% 99%    Last Pain:  Vitals:   05/08/22 1500  TempSrc:   PainSc: 0-No pain                 Artie Takayama,W. EDMOND

## 2022-05-08 NOTE — Plan of Care (Signed)

## 2022-05-08 NOTE — Plan of Care (Signed)
  Problem: Coping: Goal: Ability to adjust to condition or change in health will improve Outcome: Progressing   Problem: Fluid Volume: Goal: Ability to maintain a balanced intake and output will improve Outcome: Progressing   Problem: Skin Integrity: Goal: Risk for impaired skin integrity will decrease Outcome: Progressing

## 2022-05-08 NOTE — Anesthesia Preprocedure Evaluation (Addendum)
Anesthesia Evaluation  Patient identified by MRN, date of birth, ID band Patient awake    Reviewed: Allergy & Precautions, H&P , NPO status , Patient's Chart, lab work & pertinent test results  Airway Mallampati: II  TM Distance: >3 FB Neck ROM: Full    Dental no notable dental hx. (+) Teeth Intact, Dental Advisory Given   Pulmonary neg pulmonary ROS, former smoker,    Pulmonary exam normal breath sounds clear to auscultation       Cardiovascular negative cardio ROS   Rhythm:Regular Rate:Normal     Neuro/Psych Seizures -, Well Controlled,  negative psych ROS   GI/Hepatic GERD  Medicated,(+) Cirrhosis       , Hepatitis -  Endo/Other  diabetes, Type 2, Oral Hypoglycemic Agents  Renal/GU Renal disease  negative genitourinary   Musculoskeletal   Abdominal   Peds  Hematology  (+) Blood dyscrasia, anemia ,   Anesthesia Other Findings   Reproductive/Obstetrics negative OB ROS                            Anesthesia Physical Anesthesia Plan  ASA: 3  Anesthesia Plan: MAC   Post-op Pain Management: Minimal or no pain anticipated   Induction: Intravenous  PONV Risk Score and Plan: 1 and Propofol infusion  Airway Management Planned: Natural Airway and Simple Face Mask  Additional Equipment:   Intra-op Plan:   Post-operative Plan:   Informed Consent: I have reviewed the patients History and Physical, chart, labs and discussed the procedure including the risks, benefits and alternatives for the proposed anesthesia with the patient or authorized representative who has indicated his/her understanding and acceptance.     Dental advisory given  Plan Discussed with: CRNA  Anesthesia Plan Comments:         Anesthesia Quick Evaluation

## 2022-05-08 NOTE — Transfer of Care (Signed)
Immediate Anesthesia Transfer of Care Note  Patient: Gary Mitchell  Procedure(s) Performed: Procedure(s): COLONOSCOPY WITH PROPOFOL (N/A) POLYPECTOMY HEMOSTASIS CLIP PLACEMENT  Patient Location: PACU and Endoscopy Unit  Anesthesia Type:MAC  Level of Consciousness: awake, alert  and oriented  Airway & Oxygen Therapy: Patient Spontanous Breathing and Patient connected to nasal cannula oxygen  Post-op Assessment: Report given to RN and Post -op Vital signs reviewed and stable  Post vital signs: Reviewed and stable  Last Vitals:  Vitals:   05/08/22 1148 05/08/22 1302  BP: (!) 129/90 (!) 149/102  Pulse: (!) 109 (!) 103  Resp: 17 12  Temp: 36.8 C 37.2 C  SpO2: 03% 559%    Complications: No apparent anesthesia complications

## 2022-05-08 NOTE — Op Note (Signed)
[facility] Patient Name: Gary Mitchell Procedure Date: 05/08/2022 MRN: [id number] Attending MD: Gladstone Pih. Candis Schatz , MD, [id number] Date of Birth: Aug 09, 1970 CSN: [id number] Age: 51 Admit Type: Inpatient Procedure:                Colonoscopy Indications:              Unexplained iron deficiency anemia (presented with                            hgb 5, severely iron indices) without overt GI                            bleeding, EGD yesterday with very mild PHG and                            small varices. Patient did report seeing blood in                            stool during bowel prep. Providers:                Gladstone Pih. Candis Schatz, MD, Jaci Carrel, RN, [facility] Technician, Technician, Brien Mates, RNFA Referring MD:              Medicines:                Monitored Anesthesia Care Complications:            No immediate complications. Estimated Blood Loss:     Estimated blood loss was minimal. Procedure:                Pre-Anesthesia Assessment:                           - Prior to the procedure, a History and Physical                            was performed, and patient medications and                            allergies were reviewed. The patient's tolerance of                            previous anesthesia was also reviewed. The risks                            and benefits of the procedure and the sedation                            options and risks were discussed with the patient.                            All questions were answered, and informed consent  was obtained. Prior Anticoagulants: The patient has                            taken no anticoagulant or antiplatelet agents. ASA                            Grade Assessment: III - A patient with severe                            systemic disease. After reviewing the risks and                            benefits, the patient was deemed in  satisfactory                            condition to undergo the procedure.                           After obtaining informed consent, the colonoscope                            was passed under direct vision. Throughout the                            procedure, the patient's blood pressure, pulse, and                            oxygen saturations were monitored continuously. The                            CF-HQ190L ([id number]) Olympus colonoscope was                            introduced through the anus and advanced to the the                            terminal ileum, with identification of the                            appendiceal orifice and IC valve. The colonoscopy                            was somewhat difficult due to poor endoscopic                            visualization secondary to fair prep quality, colon                            spasm/poor insufflation and a tortuous colon.                            Successful completion of the procedure was aided by  changing the patient to a supine position and using                            manual pressure. The patient tolerated the                            procedure well. The quality of the bowel                            preparation was fair. The terminal ileum, ileocecal                            valve, appendiceal orifice, and rectum were                            photographed. Scope In: 1:54:00 PM Scope Out: 2:35:06 PM Scope Withdrawal Time: 0 hours 30 minutes 29 seconds  Total Procedure Duration: 0 hours 41 minutes 6 seconds  Findings:      The perianal and digital rectal examinations were normal. Pertinent       negatives include normal sphincter tone and no palpable rectal lesions.      Clotted blood was found in the entire colon.      An 8 mm polyp was found in the descending colon. The polyp was       pedunculated. The polyp was removed with a cold snare. Resection and       retrieval  were complete. To prevent bleeding after the polypectomy, one       hemostatic clip was successfully placed (MR conditional). Clip       manufacturer: [employer]. There was no bleeding at the end of the       procedure.      The exam was otherwise normal throughout the examined colon.      The terminal ileum contained red blood. The terminal ileum was not able       to be deeply intubated (only a few centimeters)      The remainder of the exam in the terminal ileum was normal.      The retroflexed view of the distal rectum and anal verge was normal and       showed no anal or rectal abnormalities. Impression:               - Preparation of the colon was fair.                           - Blood in the entire examined colon.                           - One 8 mm polyp in the descending colon, removed                            with a cold snare. Resected and retrieved. Clip (MR                            conditional) was placed. Clip manufacturer: [employer].                           - Blood in the terminal ileum, likely indicating                            bleeding from the small intestine. Although ileal                            intubation was minimal, there were no abnormalities                            seen in the T.I to explain the bleeding. Intestinal                            AVMs could explain profound iron deficiency anemia                            as well as intermittent overt bleeding.                           - The distal rectum and anal verge are normal on                            retroflexion view. Moderate Sedation:      Not Applicable - Patient had care per Anesthesia. Recommendation:           - Return patient to hospital ward for ongoing care.                           - Clear liquid diet this evening.                           - To visualize the small bowel, perform video                            capsule endoscopy  either today or tomorrow                            (depending on equipment availability).                           - Repeat CBC this evening and transfuse for hgb <7. Procedure Code(s):        --- Professional ---                           [id number], Colonoscopy, flexible; with removal of                            tumor(s), polyp(s), or other lesion(s) by snare                            technique Diagnosis Code(s):        --- Professional ---  K92.2, Gastrointestinal hemorrhage, unspecified                           D12.4, Benign neoplasm of descending colon                           D50.9, Iron deficiency anemia, unspecified CPT copyright 2022 American Medical Association. All rights reserved. The codes documented in this report are preliminary and upon coder review may  be revised to meet current compliance requirements. Keyle Doby E. Candis Schatz, MD 05/08/2022 2:54:54 PM This report has been signed electronically. Number of Addenda: 0

## 2022-05-08 NOTE — Progress Notes (Signed)
Notified on call provider that patient had frequent loose, bloody stools after taking moving prep for colonoscopy. Patient also had one episode of small, bloody emesis with no nausea after that. On call provider did put an order to check CBC.

## 2022-05-08 NOTE — Interval H&P Note (Signed)
History and Physical Interval Note:  05/08/2022 1:44 PM  Gary Mitchell  has presented today for surgery, with the diagnosis of anemia.  The various methods of treatment have been discussed with the patient and family. After consideration of risks, benefits and other options for treatment, the patient has consented to  Procedure(s): COLONOSCOPY WITH PROPOFOL (N/A) as a surgical intervention.  The patient's history has been reviewed, patient examined, no change in status, stable for surgery.  I have reviewed the patient's chart and labs.  Questions were answered to the patient's satisfaction.     [name]

## 2022-05-08 NOTE — Progress Notes (Addendum)
For colonoscopy today. Brief pre-procedure visit.    # 51 yo male with Etoh cirrhosis complicated by portal HTN with history of hepatic encephalopathy, esophageal varices).  Still actively consuming Etoh. Admitted with symptomatic iron deficiency anemia. No overt GI bleeding at home and heme negative but then had small volume hematemesis in ED EGD yesterday with only grade I esophageal varices and mild portal gastropathy. Gastric bx pending to rule out H.pylori. Blood in stool during bowel prep last night. Now resolved.  So far has gotten 3  PRBCs this admission.  Hgb 7.3 today, down overnight from 8.1.  Today's labs : Na 133, renal function normal. Not sure why tbili continues to rise since admission ( 3.1 >> 8.1 >> 10.1>> 11.5). Mild predominance of direct over indirect. Alk phos and liver enzymes normal.    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  MRI 11/2021 showed pancreatic cystic lesion. EUS 01/2022 showed thickening but no masses. Scheduled for follow up CT scan with pancreatic protocol, maybe this can be done while in hospital    VSS. No complaints. No further bleeding. Tells me bowels are clear now after prep.   ----------------------------------------------------------------------------------------  I have taken a history, reviewed the chart and examined the patient. I performed a substantive portion of this encounter, including complete performance of at least one of the key components, in conjunction with the APP. I agree with the APP's note, impression and recommendations  Patient's colonoscopy today was notable for all blood and clots throughout the colon, and red blood in the terminal ileum.  No diverticula or vascular lesions in the colon to account for the blood.  This is suggestive of a GI bleed originating from the small intestine.  He is scheduled for a capsule endoscopy tomorrow morning. Regarding his hyperbilirubinemia, his liver enzymes continue to be completely  stable, but his bilirubin continues to rise.  Fractionated bilirubin showed near equal parts direct and indirect.  He is not having any abdominal pain, nausea or vomiting.  Suspect multifactorial hyperbilirubinemia secondary to cirrhosis, and possible hemolysis.  We will check for evidence of hemolysis with LDH and haptoglobin.  Aashritha Miedema E. Candis Schatz, MD [facility]

## 2022-05-08 NOTE — Progress Notes (Signed)
[facility]                                                                               Gary Mitchell, is a 51 y.o. male, DOB - [birth year], ZJI:[id number] Admit date - 05/05/2022    Outpatient Primary MD for the patient is Charlott Rakes, MD  LOS - 2  days    Brief summary   Gary Mitchell is a 51 yo male with PMH alcoholic cirrhosis, portal hypertension, esophageal varices, anemia, pancreatic lesion (possibly IPMN), DM II, seizures who presented to the hospital after referral from GI office he says. He had outpatient lab work checked and was found to have critical low hemoglobin and was referred to the ER. On work-up he was found to have hemoglobin 5 g/dL and was ordered 2 units PRBC. GI was also consulted on admission.   Assessment & Plan    Assessment and Plan:  Iron deficiency anemia/ acute anemia of blood loss:  Hemoglobin at 5 on admission.  Bloody bowel movements and hematemesis last night.  Total of 3 units of PRBC transfusion.  GI consulted, underwent EGD shows grade I esophageal varices and mild portal gastropathy. Gastric bx pending to rule out H.pylori. Hemoglobin dropped from 8.1 to 7.3.     Elevated total Bilirubin:  No nausea, vomiting and abdominal pain.   Bilirubin increased from 3.1 to 8.1  to 11.5.  Check CT abdomen with pancreas protocol.     Mild thrombocytopenia:  Monitor.    Portal hypertension Esophageal varices History of hepatic encephalopathy Cirrhosis - non compliance with lactulose  - s/p recent EUS July 2023 with varices noted.   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  EUS ON 7/23  shows thickening, no masses.    Type 2 DM:  CBG (last 3)  Recent Labs    05/08/22 0445 05/08/22 0753 05/08/22 1146  GLUCAP 127* 150* 150*   Resume SSI.    Hypertension:  Well controlled.   Hypokalemia: replaced.    Seizures;  On keppra.    GERD ON PPI.    Hypomagnesemia:  Replaced.     Hyperammonemia:  Recheck levels in am.   RN Pressure Injury Documentation:    Malnutrition Type:  Nutrition Problem: Moderate Malnutrition Etiology: chronic illness (alcoholic cirrhosis)   Malnutrition Characteristics:  Signs/Symptoms: energy intake < or equal to 75% for > or equal to 1 month, mild fat depletion, mild muscle depletion   Nutrition Interventions:  Interventions: Other (Comment) (Ensure Max)  Estimated body mass index is 23.84 kg/m as calculated from the following:   Height as of this encounter: '5\' 4"'$  (1.626 m).   Weight as of this encounter: 63 kg.  Code Status: full code.  DVT Prophylaxis:  SCDs Start: 05/06/22 0308   Level of Care: Level of care: Med-Surg Family Communication: none at bedside.   Disposition Plan:     Remains inpatient appropriate:  colonoscopy,  Procedures:  Egd colonoscopy  Consultants:   GI  Antimicrobials:   Anti-infectives (From admission, onward)    None        Medications  Scheduled Meds:  [MAR Hold] folic acid  1 mg Oral  Daily   [MAR Hold] gabapentin  300 mg Oral QHS   [MAR Hold] insulin aspart  0-9 Units Subcutaneous Q4H   [MAR Hold] lactulose  20 g Oral BID   [MAR Hold] levETIRAcetam  500 mg Oral BID   [MAR Hold] multivitamin with minerals  1 tablet Oral Daily   [MAR Hold] pantoprazole (PROTONIX) IV  40 mg Intravenous Q12H   [MAR Hold] thiamine  100 mg Oral Daily   Or   [MAR Hold] thiamine  100 mg Intravenous Daily   Continuous Infusions:  [MAR Hold] sodium chloride Stopped (05/05/22 2327)   sodium chloride 20 mL/hr at 05/08/22 0008   lactated ringers 10 mL/hr at 05/08/22 1312   PRN Meds:.[MAR Hold] hydrALAZINE, [MAR Hold] HYDROcodone-acetaminophen, [MAR Hold] labetalol, [MAR Hold] LORazepam **OR** [MAR Hold] LORazepam, [MAR Hold] ondansetron (ZOFRAN) IV, [MAR Hold] mouth rinse    Subjective:   Gary Mitchell was seen and examined today.  ONE BLOODY BM LAST night and one episode of  hematemesis.  Resolved.   Objective:   Vitals:   05/07/22 2013 05/08/22 0443 05/08/22 1148 05/08/22 1302  BP: 129/89 129/89 (!) 129/90 (!) 149/102  Pulse: 89 100 (!) 109 (!) 103  Resp: '17 18 17 12  '$ Temp: 98.2 F (36.8 C) 98.7 F (37.1 C) 98.3 F (36.8 C) 99 F (37.2 C)  TempSrc: Oral Oral Oral Temporal  SpO2: 100% 98% 99% 100%  Weight:      Height:        Intake/Output Summary (Last 24 hours) at 05/08/2022 1318 Last data filed at 05/08/2022 0700 Gross per 24 hour  Intake 1637.33 ml  Output 200 ml  Net 1437.33 ml   Filed Weights   05/06/22 0409 05/07/22 1313  Weight: 63 kg 63 kg     Exam General: Alert and oriented x 3, NAD Cardiovascular: S1 S2 auscultated, no murmurs, RRR Respiratory: Clear to auscultation bilaterally, no wheezing, rales or rhonchi Gastrointestinal: Soft, nontender, nondistended, + bowel sounds Ext: no pedal edema bilaterally Neuro: AAOx3, Cr N's II- XII. Strength 5/5 upper and lower extremities bilaterally Skin: No rashes Psych: Normal affect and demeanor, alert and oriented x3    Data Reviewed:  I have personally reviewed following labs and imaging studies   CBC Lab Results  Component Value Date   WBC 5.8 05/08/2022   RBC 3.21 (L) 05/08/2022   HGB 7.3 (L) 05/08/2022   HCT 23.2 (L) 05/08/2022   MCV 72.3 (L) 05/08/2022   MCH 22.7 (L) 05/08/2022   PLT 111 (L) 05/08/2022   MCHC 31.5 05/08/2022   RDW 30.3 (H) 05/08/2022   LYMPHSABS 1.2 05/08/2022   MONOABS 1.0 05/08/2022   EOSABS 0.1 05/08/2022   BASOSABS 0.1 24/40/1027     Last metabolic panel Lab Results  Component Value Date   NA 133 (L) 05/08/2022   K 3.6 05/08/2022   CL 104 05/08/2022   CO2 19 (L) 05/08/2022   BUN 7 05/08/2022   CREATININE 0.39 (L) 05/08/2022   GLUCOSE 139 (H) 05/08/2022   GFRNONAA >60 05/08/2022   GFRAA 147 01/12/2020   CALCIUM 8.5 (L) 05/08/2022   PHOS 3.1 05/06/2022   PROT 7.5 05/08/2022   ALBUMIN 3.2 (L) 05/08/2022   LABGLOB 4.6 (H) 08/22/2021    AGRATIO 0.8 (L) 08/22/2021   BILITOT 11.5 (H) 05/08/2022   ALKPHOS 96 05/08/2022   AST 37 05/08/2022   ALT 13 05/08/2022   ANIONGAP 10 05/08/2022    CBG (last 3)  Recent Labs  05/08/22 0445 05/08/22 0753 05/08/22 1146  GLUCAP 127* 150* 150*      Coagulation Profile: Recent Labs  Lab 05/03/22 1002 05/05/22 2359 05/06/22 0452  INR 1.5* 1.4* 1.5*     Radiology Studies: No results found.     Hosie Poisson M.D. [facility] 05/08/2022, 1:18 PM  Available via Epic secure chat 7am-7pm After 7 pm, please refer to night coverage provider listed on amion.

## 2022-05-09 ENCOUNTER — Encounter (HOSPITAL_COMMUNITY): Payer: Self-pay | Admitting: Internal Medicine

## 2022-05-09 ENCOUNTER — Encounter (HOSPITAL_COMMUNITY)
Admission: EM | Disposition: A | Discharge status: Home / Self Care | Payer: Self-pay | Source: Home / Self Care | Attending: Internal Medicine

## 2022-05-09 ENCOUNTER — Inpatient Hospital Stay (HOSPITAL_COMMUNITY): Payer: 59

## 2022-05-09 HISTORY — PX: GIVENS CAPSULE STUDY: SHX5432

## 2022-05-09 LAB — TYPE AND SCREEN
ABO/RH(D): O POS
Antibody Screen: NEGATIVE
Unit division: 0
Unit division: 0
Unit division: 0
Unit division: 0

## 2022-05-09 LAB — BPAM RBC
Blood Product Expiration Date: 202311262359
Blood Product Expiration Date: 202311262359
Blood Product Expiration Date: 202311272359
Blood Product Expiration Date: 202312012359
ISSUE DATE / TIME: 202310300353
ISSUE DATE / TIME: 202310300750
ISSUE DATE / TIME: 202310311045
ISSUE DATE / TIME: 202311011706
Unit Type and Rh: 5100
Unit Type and Rh: 5100
Unit Type and Rh: 5100
Unit Type and Rh: 5100

## 2022-05-09 LAB — TECHNOLOGIST SMEAR REVIEW
Clinical Information: ELEVATED
Plt Morphology: DECREASED

## 2022-05-09 LAB — CBC WITH DIFFERENTIAL/PLATELET
Abs Immature Granulocytes: 0.01 10*3/uL (ref 0.00–0.07)
Abs Immature Granulocytes: 0.03 10*3/uL (ref 0.00–0.07)
Basophils Absolute: 0 10*3/uL (ref 0.0–0.1)
Basophils Absolute: 0 10*3/uL (ref 0.0–0.1)
Basophils Relative: 1 %
Basophils Relative: 0 %
Eosinophils Absolute: 0 10*3/uL (ref 0.0–0.5)
Eosinophils Absolute: 0 10*3/uL (ref 0.0–0.5)
Eosinophils Relative: 0 %
Eosinophils Relative: 0 %
HCT: 24.1 % — ABNORMAL LOW (ref 39.0–52.0)
HCT: 25.7 % — ABNORMAL LOW (ref 39.0–52.0)
Hemoglobin: 7.6 g/dL — ABNORMAL LOW (ref 13.0–17.0)
Hemoglobin: 7.8 g/dL — ABNORMAL LOW (ref 13.0–17.0)
Immature Granulocytes: 0 %
Immature Granulocytes: 1 %
Lymphocytes Relative: 13 %
Lymphocytes Relative: 14 %
Lymphs Abs: 0.5 10*3/uL — ABNORMAL LOW (ref 0.7–4.0)
Lymphs Abs: 0.8 10*3/uL (ref 0.7–4.0)
MCH: 23.5 pg — ABNORMAL LOW (ref 26.0–34.0)
MCH: 23.5 pg — ABNORMAL LOW (ref 26.0–34.0)
MCHC: 31.5 g/dL (ref 30.0–36.0)
MCHC: 30.4 g/dL (ref 30.0–36.0)
MCV: 74.4 fL — ABNORMAL LOW (ref 80.0–100.0)
MCV: 77.4 fL — ABNORMAL LOW (ref 80.0–100.0)
Monocytes Absolute: 0.1 10*3/uL (ref 0.1–1.0)
Monocytes Absolute: 0.1 10*3/uL (ref 0.1–1.0)
Monocytes Relative: 2 %
Monocytes Relative: 2 %
Neutro Abs: 3.6 10*3/uL (ref 1.7–7.7)
Neutro Abs: 4.7 10*3/uL (ref 1.7–7.7)
Neutrophils Relative %: 84 %
Neutrophils Relative %: 83 %
Platelets: 87 10*3/uL — ABNORMAL LOW (ref 150–400)
Platelets: 91 10*3/uL — ABNORMAL LOW (ref 150–400)
RBC: 3.24 MIL/uL — ABNORMAL LOW (ref 4.22–5.81)
RBC: 3.32 MIL/uL — ABNORMAL LOW (ref 4.22–5.81)
RDW: 29 % — ABNORMAL HIGH (ref 11.5–15.5)
RDW: 29.3 % — ABNORMAL HIGH (ref 11.5–15.5)
WBC: 4.3 10*3/uL (ref 4.0–10.5)
WBC: 5.7 10*3/uL (ref 4.0–10.5)
nRBC: 0.5 % — ABNORMAL HIGH (ref 0.0–0.2)
nRBC: 0.4 % — ABNORMAL HIGH (ref 0.0–0.2)

## 2022-05-09 LAB — COMPREHENSIVE METABOLIC PANEL
ALT: 12 U/L (ref 0–44)
AST: 28 U/L (ref 15–41)
Albumin: 2.9 g/dL — ABNORMAL LOW (ref 3.5–5.0)
Alkaline Phosphatase: 87 U/L (ref 38–126)
Anion gap: 10 (ref 5–15)
BUN: 9 mg/dL (ref 6–20)
CO2: 14 mmol/L — ABNORMAL LOW (ref 22–32)
Calcium: 8.4 mg/dL — ABNORMAL LOW (ref 8.9–10.3)
Chloride: 102 mmol/L (ref 98–111)
Creatinine, Ser: 0.51 mg/dL — ABNORMAL LOW (ref 0.61–1.24)
GFR, Estimated: >60 mL/min (ref >60)
Glucose, Bld: 173 mg/dL — ABNORMAL HIGH (ref 70–99)
Potassium: 5.7 mmol/L — ABNORMAL HIGH (ref 3.5–5.1)
Sodium: 126 mmol/L — ABNORMAL LOW (ref 135–145)
Total Bilirubin: 11.3 mg/dL — ABNORMAL HIGH (ref 0.3–1.2)
Total Protein: 7 g/dL (ref 6.5–8.1)

## 2022-05-09 LAB — GLUCOSE, CAPILLARY
Glucose-Capillary: 161 mg/dL — ABNORMAL HIGH (ref 70–99)
Glucose-Capillary: 164 mg/dL — ABNORMAL HIGH (ref 70–99)
Glucose-Capillary: 168 mg/dL — ABNORMAL HIGH (ref 70–99)
Glucose-Capillary: 187 mg/dL — ABNORMAL HIGH (ref 70–99)
Glucose-Capillary: 245 mg/dL — ABNORMAL HIGH (ref 70–99)
Glucose-Capillary: 311 mg/dL — ABNORMAL HIGH (ref 70–99)
Glucose-Capillary: 338 mg/dL — ABNORMAL HIGH (ref 70–99)

## 2022-05-09 LAB — BASIC METABOLIC PANEL
Anion gap: 11 (ref 5–15)
BUN: 10 mg/dL (ref 6–20)
CO2: 15 mmol/L — ABNORMAL LOW (ref 22–32)
Calcium: 8.5 mg/dL — ABNORMAL LOW (ref 8.9–10.3)
Chloride: 100 mmol/L (ref 98–111)
Creatinine, Ser: 0.55 mg/dL — ABNORMAL LOW (ref 0.61–1.24)
GFR, Estimated: >60 mL/min (ref >60)
Glucose, Bld: 206 mg/dL — ABNORMAL HIGH (ref 70–99)
Potassium: 6.2 mmol/L — ABNORMAL HIGH (ref 3.5–5.1)
Sodium: 126 mmol/L — ABNORMAL LOW (ref 135–145)

## 2022-05-09 LAB — HEMOGLOBIN AND HEMATOCRIT, BLOOD
HCT: 23 % — ABNORMAL LOW (ref 39.0–52.0)
Hemoglobin: 7.2 g/dL — ABNORMAL LOW (ref 13.0–17.0)

## 2022-05-09 LAB — DIC (DISSEMINATED INTRAVASCULAR COAGULATION)PANEL
D-Dimer, Quant: 0.49 ug/mL-FEU (ref 0.00–0.50)
Fibrinogen: 173 mg/dL — ABNORMAL LOW (ref 210–475)
INR: 1.6 — ABNORMAL HIGH (ref 0.8–1.2)
Platelets: 83 10*3/uL — ABNORMAL LOW (ref 150–400)
Prothrombin Time: 18.8 seconds — ABNORMAL HIGH (ref 11.4–15.2)
aPTT: 33 seconds (ref 24–36)

## 2022-05-09 LAB — MAGNESIUM: Magnesium: 1.6 mg/dL — ABNORMAL LOW (ref 1.7–2.4)

## 2022-05-09 LAB — POTASSIUM: Potassium: 4.4 mmol/L (ref 3.5–5.1)

## 2022-05-09 LAB — SURGICAL PATHOLOGY

## 2022-05-09 LAB — AMMONIA: Ammonia: 104 umol/L — ABNORMAL HIGH (ref 9–35)

## 2022-05-09 SURGERY — IMAGING PROCEDURE, GI TRACT, INTRALUMINAL, VIA CAPSULE
Anesthesia: LOCAL

## 2022-05-09 MED ORDER — STERILE WATER FOR INJECTION IV SOLN
INTRAVENOUS | Status: DC
  Filled 2022-05-09: qty 1000
  Filled 2022-05-09 (×4): qty 150
  Filled 2022-05-09: qty 1000
  Filled 2022-05-09: qty 150
  Filled 2022-05-09: qty 1000

## 2022-05-09 MED ORDER — LACTULOSE 10 GM/15ML PO SOLN
20.0000 g | Freq: Three times a day (TID) | ORAL | Status: DC
  Administered 2022-05-09 – 2022-05-12 (×7): 20 g via ORAL
  Filled 2022-05-09 (×8): qty 30

## 2022-05-09 MED ORDER — ALBUTEROL SULFATE (2.5 MG/3ML) 0.083% IN NEBU
10.0000 mg | INHALATION_SOLUTION | Freq: Once | RESPIRATORY_TRACT | Status: AC
  Administered 2022-05-09: 10 mg via RESPIRATORY_TRACT
  Filled 2022-05-09: qty 12

## 2022-05-09 MED ORDER — DEXTROSE 50 % IV SOLN
1.0000 | Freq: Once | INTRAVENOUS | Status: AC
  Administered 2022-05-09: 50 mL via INTRAVENOUS
  Filled 2022-05-09: qty 50

## 2022-05-09 MED ORDER — SODIUM ZIRCONIUM CYCLOSILICATE 10 G PO PACK
10.0000 g | PACK | Freq: Two times a day (BID) | ORAL | Status: DC
  Administered 2022-05-09: 10 g via ORAL
  Filled 2022-05-09: qty 1

## 2022-05-09 MED ORDER — DIPHENHYDRAMINE HCL 25 MG PO CAPS
50.0000 mg | ORAL_CAPSULE | Freq: Once | ORAL | Status: DC
  Filled 2022-05-09: qty 2

## 2022-05-09 MED ORDER — DIPHENHYDRAMINE HCL 50 MG/ML IJ SOLN: 50.0000 mg | Freq: Once | INTRAMUSCULAR | Status: DC

## 2022-05-09 MED ORDER — MAGNESIUM SULFATE 4 GM/100ML IV SOLN
4.0000 g | Freq: Once | INTRAVENOUS | Status: AC
  Administered 2022-05-09: 4 g via INTRAVENOUS
  Filled 2022-05-09: qty 100

## 2022-05-09 MED ORDER — PREDNISONE 50 MG PO TABS
50.0000 mg | ORAL_TABLET | Freq: Four times a day (QID) | ORAL | Status: DC
  Filled 2022-05-09: qty 1

## 2022-05-09 MED ORDER — INSULIN ASPART 100 UNIT/ML IV SOLN
5.0000 [IU] | Freq: Once | INTRAVENOUS | Status: AC
  Administered 2022-05-09: 5 [IU] via INTRAVENOUS

## 2022-05-09 MED ORDER — IOHEXOL 300 MG/ML  SOLN
100.0000 mL | Freq: Once | INTRAMUSCULAR | Status: AC | PRN
  Administered 2022-05-09: 100 mL via INTRAVENOUS

## 2022-05-09 MED ORDER — SODIUM CHLORIDE (PF) 0.9 % IJ SOLN
INTRAMUSCULAR | Status: AC
  Filled 2022-05-09: qty 50

## 2022-05-09 SURGICAL SUPPLY — 1 items: TOWEL COTTON PACK 4EA (MISCELLANEOUS) ×4 IMPLANT

## 2022-05-09 NOTE — Progress Notes (Signed)
CAT started at 1802 and ended at 1806 due to vomiting. CAT removed to avoid possible aspiration - RN aware. Asked that PT let staff know when no longer nauseated / vomiting so CAT can be resumed.

## 2022-05-09 NOTE — Progress Notes (Signed)
[facility]                                                                               [name], is a 51 y.o. male, DOB - 10-15-1970, VVO:[id number] Admit date - 05/05/2022    Outpatient Primary MD for the patient is Charlott Rakes, MD  LOS - 3  days    Brief summary   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 He had outpatient lab work checked and was found to have critical low hemoglobin and was referred to the ER. On work-up he was found to have hemoglobin 5 g/dL and was ordered 2 units PRBC. GI was also consulted on admission.   Assessment & Plan    Assessment and Plan:  Iron deficiency anemia/ acute anemia of blood loss:  Hemoglobin at 5 on admission. S/P 4 units of prbc transfusion and hemoglobin around 7 with per sistent hematemesis and rectal bleeding and melanotic stools.  GI consulted, underwent EGD shows grade I esophageal varices and mild portal gastropathy. Gastric bx pending to rule out H.pylori. Colonoscopy shows Blood in the entire examined colon. One 8 mm polyp in the descending colon, removed with a cold snare. Resected and retrieved. Clip (MR conditional) was placed. Blood in the terminal ileum, likely indicating bleeding from the small intestine. Intestinal AVMs could explain profound iron deficiency anemia as well as intermittent overt bleeding. Capsule endoscopy ordered by GI and pending.  - transfuse to keep hemoglobin greater than 7, check H&H every 12 hours.     Elevated total Bilirubin:  Possibly from cirrhosis, .  Patient had elevated bilirubin in the past as high as 28.  INR is 1.6, LDH is wnl, haptoglobin is pending. DIC panel shows normal d dimer, fibrinogen slightly low, and schistocytes present. Curbside with Dr Benay Spice, suggested elevated  levels probably from cirrhosis, and less likely hemolysis. Peripheral smear ordered.  CT abd and pelvis with pancreas protocol done showed suspicious pancreatic lesion with nodular component with abrupt ductal transition. Plan for repeat endoscopic [place] to resample the area.      Mild thrombocytopenia: from cirrhosis.  Monitor.    Portal hypertension Esophageal varices History of hepatic encephalopathy Cirrhosis - non compliance with lactulose  - s/p recent EUS July 2023 with varices noted. - target atleast 2 bm daily. Pt refused lactulose the last 2 days.  - ammonia level high this am.     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  EUS ON 7/23  shows thickening, no masses.  EUS this Monday.    Type 2 DM:  CBG (last 3)  Recent Labs    05/09/22 0510 05/09/22 0728 05/09/22 1207  GLUCAP 164* 168* 187*    Resume SSI.    Hypertension:  Well controlled. No changes in meds.    Hypokalemia: replaced.    Seizures;  On keppra.    GERD ON PPI.    Hypomagnesemia:  Replaced.    Hyperkalemia:  Unclear etiology.  Lokelma given and repeat  levels this evening wnl.       RN Pressure Injury Documentation:    Malnutrition Type:  Nutrition Problem: Moderate Malnutrition Etiology: chronic illness (alcoholic cirrhosis)   Malnutrition Characteristics:  Signs/Symptoms: energy intake < or equal to 75% for > or equal to 1 month, mild fat depletion, mild muscle depletion   Nutrition Interventions:  Interventions: Other (Comment) (Ensure Max)  Estimated body mass index is 23.84 kg/m as calculated from the following:   Height as of this encounter: '5\' 4"'$  (1.626 m).   Weight as of this encounter: 63 kg.  Code Status: full code.  DVT Prophylaxis:  SCDs Start: 05/06/22 0308   Level of Care: Level of care: Med-Surg Family Communication: none at bedside.   Disposition Plan:     Remains inpatient appropriate:   colonoscopy,  Procedures:  Egd colonoscopy  Consultants:   GI  Antimicrobials:   Anti-infectives (From admission, onward)    None        Medications  Scheduled Meds:  diphenhydrAMINE  50 mg Oral Once   Or   diphenhydrAMINE  50 mg Intravenous Once   folic acid  1 mg Oral Daily   gabapentin  300 mg Oral QHS   insulin aspart  0-9 Units Subcutaneous Q4H   lactulose  20 g Oral BID   levETIRAcetam  500 mg Oral BID   multivitamin with minerals  1 tablet Oral Daily   pantoprazole (PROTONIX) IV  40 mg Intravenous Q12H   predniSONE  50 mg Oral Q6H   sodium chloride (PF)       thiamine  100 mg Oral Daily   Or   thiamine  100 mg Intravenous Daily   Continuous Infusions:  sodium chloride Stopped (05/05/22 2327)   lactated ringers 10 mL/hr at 05/08/22 1312   PRN Meds:.hydrALAZINE, HYDROcodone-acetaminophen, labetalol, ondansetron (ZOFRAN) IV, mouth rinse, sodium chloride (PF)    Subjective:   [name] was seen and examined today.  One episode of hematemesis and bloody bowel movement later this evening.  Objective:   Vitals:   05/08/22 2030 05/09/22 0504 05/09/22 0815 05/09/22 0831  BP: (!) 145/94 131/85  132/75  Pulse: 86 89  87  Resp: '18 18  17  '$ Temp: 98.3 F (36.8 C) 98.9 F (37.2 C)  98.2 F (36.8 C)  TempSrc: Oral Oral  Oral  SpO2: 100% 100%  100%  Weight:   63 kg   Height:   '5\' 4"'$  (1.626 m)     Intake/Output Summary (Last 24 hours) at 05/09/2022 1259 Last data filed at 05/09/2022 1100 Gross per 24 hour  Intake 1870 ml  Output --  Net 1870 ml    Filed Weights   05/06/22 0409 05/07/22 1313 05/09/22 0815  Weight: 63 kg 63 kg 63 kg     Exam General exam: ill appearing gentleman. Not in distress.  Respiratory system: Clear to auscultation. Respiratory effort normal. Cardiovascular system: S1 & S2 heard, RRR. No JVD, No pedal edema. Gastrointestinal system: Abdomen is nondistended, soft and nontender. . Normal bowel sounds heard. Central  nervous system: Alert and oriented. No focal neurological deficits. Extremities: Symmetric 5 x 5 power. Skin: No rashes Psychiatry:  Mood & affect appropriate.     Data Reviewed:  I have personally reviewed following labs and imaging studies   CBC Lab Results  Component Value Date   WBC 4.3 05/09/2022   RBC 3.24 (L) 05/09/2022   HGB 7.6 (L) 05/09/2022   HCT 24.1 (L) 05/09/2022   MCV  74.4 (L) 05/09/2022   MCH 23.5 (L) 05/09/2022   PLT 87 (L) 05/09/2022   MCHC 31.5 05/09/2022   RDW 29.0 (H) 05/09/2022   LYMPHSABS 0.5 (L) 05/09/2022   MONOABS 0.1 05/09/2022   EOSABS 0.0 05/09/2022   BASOSABS 0.0 40/04/2724     Last metabolic panel Lab Results  Component Value Date   NA 126 (L) 05/09/2022   K 5.7 (H) 05/09/2022   CL 102 05/09/2022   CO2 14 (L) 05/09/2022   BUN 9 05/09/2022   CREATININE 0.51 (L) 05/09/2022   GLUCOSE 173 (H) 05/09/2022   GFRNONAA >60 05/09/2022   GFRAA 147 01/12/2020   CALCIUM 8.4 (L) 05/09/2022   PHOS 3.1 05/06/2022   PROT 7.0 05/09/2022   ALBUMIN 2.9 (L) 05/09/2022   LABGLOB 4.6 (H) 08/22/2021   AGRATIO 0.8 (L) 08/22/2021   BILITOT 11.3 (H) 05/09/2022   ALKPHOS 87 05/09/2022   AST 28 05/09/2022   ALT 12 05/09/2022   ANIONGAP 10 05/09/2022    CBG (last 3)  Recent Labs    05/09/22 0510 05/09/22 0728 05/09/22 1207  GLUCAP 164* 168* 187*       Coagulation Profile: Recent Labs  Lab 05/03/22 1002 05/05/22 2359 05/06/22 0452  INR 1.5* 1.4* 1.5*      Radiology Studies: CT ABDOMEN PELVIS W CONTRAST  Result Date: 05/09/2022 CLINICAL DATA:  51 year old male presents with pancreatic lesion. EXAM: CT ABDOMEN AND PELVIS WITH CONTRAST TECHNIQUE: Multidetector CT imaging of the abdomen and pelvis was performed using the standard protocol following bolus administration of intravenous contrast. RADIATION DOSE REDUCTION: This exam was performed according to the departmental dose-optimization program which includes automated exposure control,  adjustment of the mA and/or kV according to patient size and/or use of iterative reconstruction technique. CONTRAST:  138m OMNIPAQUE IOHEXOL 300 MG/ML  SOLN COMPARISON:  Nov 14, 2021 MRI of the abdomen. FINDINGS: Lower chest: Lung bases are clear. No effusion.  No consolidation or chest wall abnormality. Hepatobiliary: Cirrhotic hepatic morphology. No focal, suspicious hepatic lesion. Portal vein is patent. Recanalized umbilical vein with large body wall collaterals extending from the umbilicus LEFT inferior epigastric vein. Pancreas: Cystic lesion in the junction of body-tail of the pancreas measuring approximate 3.8 x 3.3 cm is within 1 mm of previous measurement and is associated as on the prior study with marked dilation of the distal pancreatic duct up to 8 mm with associated parenchymal atrophy of the peripheral pancreas. Nodular area at at the margin of the cystic lesion, not with in the cystic lesion but within pancreatic parenchyma (image 35/2) 8 x 9 mm. Mildly heterogeneous area on the arterial phase adjacent to SMV (image 43/2) this measures 15 x 7 mm and enhances to a greater extent than does the remainder of the atrophied pancreas. There is mild loss of normal fatty tissue planes in the area surrounding the pancreas this is unchanged. Mild irregularity of the gastroduodenal artery is also noted. No adenopathy in the area about the pancreas. Mild fatty atrophy in the area of the pancreatic head and uncinate. Spleen: Unremarkable. Adrenals/Urinary Tract: Adrenal glands are normal. Absent LEFT kidney. Hypertrophied RIGHT kidney. No focal, suspicious renal lesion. Stomach/Bowel: No stranding adjacent to colon or small bowel. Appendix is normal. Vascular/Lymphatic: Patent portal vein and SMV. Patent splenic vein. Aortic atherosclerosis without aneurysm. No adenopathy in the abdomen in the pelvis. Varices about the anterior abdominal wall similar to previous imaging. Reproductive: Unremarkable by CT. Other:  No ascites. Musculoskeletal: No acute bone finding. No  destructive bone process. Spinal degenerative changes. IMPRESSION: 1. Cystic lesion in the junction of the body-tail of the pancreas is within 1 mm of previous measurement and is associated with marked dilation of the distal pancreatic duct up to 8 mm and associated parenchymal atrophy of the peripheral pancreas. Findings may reflect pseudocyst from prior pancreatitis or cystic pancreatic neoplasm. Based on ductal transition in the mid pancreas the possibility of underlying occult neoplasm in addition to stricture is considered particularly at the site of ductal transition where there is variable pancreatic enhancement which is vaguely nodular (image 35/2) this is on the more central aspect of the cystic lesion closer to the portal-splenic confluence. 2. Ultimately endoscopic ultrasound may be helpful to assess the margins, not within the cystic lesion but at the site of ductal transition in the pancreas to exclude small indolent occult neoplasm which may be leading to ductal obstruction. 3. Other area of variable enhancement in the head of the pancreas adjacent SMV could also be assessed at this time though this is favored to represent bowel vary between more "normal" pancreas and areas of fatty atrophy in the head uncinate process. This could also be assessed with endoscopic veins. 4. Loss of normal tissue planes about the pancreas without substantial change. The likely related to prior pancreatitis. 5. Signs of hepatic cirrhosis with marked portal hypertension and large body wall collaterals. Aortic Atherosclerosis (ICD10-I70.0). Electronically Signed   By: Zetta Bills M.D.   On: 05/09/2022 08:48       Hosie Poisson M.D. [facility] 05/09/2022, 12:59 PM  Available via Epic secure chat 7am-7pm After 7 pm, please refer to night coverage provider listed on amion.

## 2022-05-09 NOTE — Progress Notes (Signed)
[facility] ROUNDING NOTE   Subjective: Patient has no complaints today.  He does report having an episode of hematemesis yesterday which she says was after his colonoscopy.  Also had some blood in his stool following his colonoscopy.  Not having any abdominal pain, nausea or vomiting currently. He received another unit of blood (his fourth unit during this admission) and his hemoglobin remained stable at 7.8 this morning. He swallowed his pill capsule this morning. He underwent a CT abdomen pelvis this morning which demonstrated essentially stable cystic pancreatic lesion in the body tail junction with pancreatic ductal dilation with abrupt transition.  No biliary obstruction was noted he was also noted to have a mildly elevated CA 19-9 at 61   Objective: Vital signs in last 24 hours: Temp:  [98.2 F (36.8 C)-98.9 F (37.2 C)] 98.5 F (36.9 C) (11/02 1352) Pulse Rate:  [86-98] 87 (11/02 1352) Resp:  [17-18] 18 (11/02 1352) BP: (115-145)/(70-100) 115/70 (11/02 1352) SpO2:  [100 %] 100 % (11/02 1352) Weight:  [76 kg] 63 kg (11/02 0815) Last BM Date : 05/07/22 General: NAD, pleasant African-American male lying in hospital bed Lungs:  CTA b/l, no w/r/r Heart:  RRR, no m/r/g Abdomen:  Soft, NT, ND, +BS Neuro: No asterixis     Intake/Output from previous day: 11/01 0701 - 11/02 0700 In: 1750 [P.O.:720; I.V.:980; IV Piggyback:50] Out: -  Intake/Output this shift: Total I/O In: 120 [P.O.:120] Out: -    Lab Results: Recent Labs    05/08/22 0447 05/08/22 1720 05/08/22 2205 05/09/22 0524 05/09/22 1220 05/09/22 1540  WBC 5.8  --   --  4.3 5.7  --   HGB 7.3*   < > 7.9* 7.6* 7.8*  --   PLT 111*  --   --  87* 91* 83*  MCV 72.3*  --   --  74.4* 77.4*  --    < > = values in this interval not displayed.   BMET Recent Labs    05/08/22 0447 05/09/22 0524 05/09/22 1220  NA 133* 126* 126*  K 3.6 5.7* 6.2*  CL 104 102 100  CO2 19* 14* 15*  GLUCOSE 139* 173*  206*  BUN '7 9 10  '$ CREATININE 0.39* 0.51* 0.55*  CALCIUM 8.5* 8.4* 8.5*   LFT Recent Labs    05/07/22 0501 05/07/22 1040 05/08/22 0447 05/09/22 0524  PROT 7.0  --  7.5 7.0  ALBUMIN 2.9*  --  3.2* 2.9*  AST 42*  --  37 28  ALT 15  --  13 12  ALKPHOS 93  --  96 87  BILITOT 8.1* 10.1* 11.5* 11.3*  BILIDIR  --  5.2*  --   --   IBILI  --  4.9*  --   --    PT/INR Recent Labs    05/09/22 1540  INR 1.6*   Component Ref Range & Units 6 d ago  CA 19-9 <34 U/mL 61 High      Imaging/Other results: CT ABDOMEN PELVIS W CONTRAST  Result Date: 05/09/2022 CLINICAL DATA:  51 year old male presents with pancreatic lesion. EXAM: CT ABDOMEN AND PELVIS WITH CONTRAST TECHNIQUE: Multidetector CT imaging of the abdomen and pelvis was performed using the standard protocol following bolus administration of intravenous contrast. RADIATION DOSE REDUCTION: This exam was performed according to the departmental dose-optimization program which includes automated exposure control, adjustment of the mA and/or kV according to patient size and/or use of iterative reconstruction technique. CONTRAST:  166m OMNIPAQUE IOHEXOL 300 MG/ML  SOLN COMPARISON:  Nov 14, 2021 MRI of the abdomen. FINDINGS: Lower chest: Lung bases are clear. No effusion.  No consolidation or chest wall abnormality. Hepatobiliary: Cirrhotic hepatic morphology. No focal, suspicious hepatic lesion. Portal vein is patent. Recanalized umbilical vein with large body wall collaterals extending from the umbilicus LEFT inferior epigastric vein. Pancreas: Cystic lesion in the junction of body-tail of the pancreas measuring approximate 3.8 x 3.3 cm is within 1 mm of previous measurement and is associated as on the prior study with marked dilation of the distal pancreatic duct up to 8 mm with associated parenchymal atrophy of the peripheral pancreas. Nodular area at at the margin of the cystic lesion, not with in the cystic lesion but within pancreatic  parenchyma (image 35/2) 8 x 9 mm. Mildly heterogeneous area on the arterial phase adjacent to SMV (image 43/2) this measures 15 x 7 mm and enhances to a greater extent than does the remainder of the atrophied pancreas. There is mild loss of normal fatty tissue planes in the area surrounding the pancreas this is unchanged. Mild irregularity of the gastroduodenal artery is also noted. No adenopathy in the area about the pancreas. Mild fatty atrophy in the area of the pancreatic head and uncinate. Spleen: Unremarkable. Adrenals/Urinary Tract: Adrenal glands are normal. Absent LEFT kidney. Hypertrophied RIGHT kidney. No focal, suspicious renal lesion. Stomach/Bowel: No stranding adjacent to colon or small bowel. Appendix is normal. Vascular/Lymphatic: Patent portal vein and SMV. Patent splenic vein. Aortic atherosclerosis without aneurysm. No adenopathy in the abdomen in the pelvis. Varices about the anterior abdominal wall similar to previous imaging. Reproductive: Unremarkable by CT. Other: No ascites. Musculoskeletal: No acute bone finding. No destructive bone process. Spinal degenerative changes. IMPRESSION: 1. Cystic lesion in the junction of the body-tail of the pancreas is within 1 mm of previous measurement and is associated with marked dilation of the distal pancreatic duct up to 8 mm and associated parenchymal atrophy of the peripheral pancreas. Findings may reflect pseudocyst from prior pancreatitis or cystic pancreatic neoplasm. Based on ductal transition in the mid pancreas the possibility of underlying occult neoplasm in addition to stricture is considered particularly at the site of ductal transition where there is variable pancreatic enhancement which is vaguely nodular (image 35/2) this is on the more central aspect of the cystic lesion closer to the portal-splenic confluence. 2. Ultimately endoscopic ultrasound may be helpful to assess the margins, not within the cystic lesion but at the site of ductal  transition in the pancreas to exclude small indolent occult neoplasm which may be leading to ductal obstruction. 3. Other area of variable enhancement in the head of the pancreas adjacent SMV could also be assessed at this time though this is favored to represent bowel vary between more "normal" pancreas and areas of fatty atrophy in the head uncinate process. This could also be assessed with endoscopic veins. 4. Loss of normal tissue planes about the pancreas without substantial change. The likely related to prior pancreatitis. 5. Signs of hepatic cirrhosis with marked portal hypertension and large body wall collaterals. Aortic Atherosclerosis (ICD10-I70.0). Electronically Signed   By: Zetta Bills M.D.   On: 05/09/2022 08:48      Assessment and Plan:  51 year old male with alcoholic cirrhosis with history of hepatic encephalopathy and small nonbleeding varices, no ascites and suspicious pancreatic lesion admitted with symptomatic anemia with hemoglobin 5, initially without overt bleeding.  EGD October 31 unremarkable, with very mild portal hypertensive gastropathy and small esophageal varices.  Colonoscopy  November 1 with blood and clot throughout the colon, as well as in the terminal ileum, suggestive of bleeding source within the small intestine.  He swallowed pill camera today, results will be reviewed tomorrow.  He has had a significant rise in his total bilirubin levels since admission (3 to 11), with otherwise stability of his liver enzymes and no biliary obstruction noted on repeat CT scan.  Fractionated bilirubin showed equal distribution of direct and indirect bilirubin levels.  He was noted to be hyperkalemic today.  LDH unremarkable, haptoglobin pending.  Exact etiology for his hyperbilirubinemia unclear, but suspect combination of cirrhosis and hemolysis.  He has a suspicious pancreatic lesion with nodular component and abrupt ductal transition.  He will likely require repeat endoscopic  ultrasound to resample this area.  If the patient is still an inpatient on Monday, Dr. Rush Landmark may be available to repeat endoscopic ultrasound.  Otherwise, will likely be several weeks before an outpatient EUS could be performed.  Iron deficiency anemia/GI bleeding - Suspect small intestine source (AVM?) - Capsule endoscopy administered today, will be read tomorrow afternoon - Trend CBC, transfuse hemoglobin less than 7  Cirrhosis, no ascites, no history of variceal bleeding - Not encephalopathic on exam, no asterixis but elevated ammonia - Continue ammonia, titrate 2-3 bowel movements per day  Hyperbilirubinemia above baseline - Suspect multifactorial from cirrhosis and hemolysis from transfusions - Other liver enzymes and INR essentially stable - No evidence of biliary obstruction on CT - Continue to monitor - LDH normal, haptoglobin pending  Suspicious pancreatic lesion, mildly elevated CA 19-9 - Likely repeat endoscopic ultrasound, possibly Monday if still inpatient  Hyponatremia, hyperkalemia - Defer to hospitalist for correction    [name], MD  05/09/2022, 4:22 PM [facility]

## 2022-05-09 NOTE — Plan of Care (Signed)

## 2022-05-10 ENCOUNTER — Other Ambulatory Visit: Payer: Self-pay

## 2022-05-10 ENCOUNTER — Telehealth: Payer: Self-pay

## 2022-05-10 DIAGNOSIS — K922 Gastrointestinal hemorrhage, unspecified: Secondary | ICD-10-CM

## 2022-05-10 DIAGNOSIS — K8689 Other specified diseases of pancreas: Secondary | ICD-10-CM

## 2022-05-10 LAB — GLUCOSE, CAPILLARY
Glucose-Capillary: 120 mg/dL — ABNORMAL HIGH (ref 70–99)
Glucose-Capillary: 160 mg/dL — ABNORMAL HIGH (ref 70–99)
Glucose-Capillary: 170 mg/dL — ABNORMAL HIGH (ref 70–99)
Glucose-Capillary: 202 mg/dL — ABNORMAL HIGH (ref 70–99)
Glucose-Capillary: 221 mg/dL — ABNORMAL HIGH (ref 70–99)
Glucose-Capillary: 223 mg/dL — ABNORMAL HIGH (ref 70–99)

## 2022-05-10 LAB — CBC WITH DIFFERENTIAL/PLATELET
Abs Immature Granulocytes: 0.03 10*3/uL (ref 0.00–0.07)
Basophils Absolute: 0 10*3/uL (ref 0.0–0.1)
Basophils Relative: 0 %
Eosinophils Absolute: 0 10*3/uL (ref 0.0–0.5)
Eosinophils Relative: 0 %
HCT: 18.6 % — ABNORMAL LOW (ref 39.0–52.0)
Hemoglobin: 5.9 g/dL — CL (ref 13.0–17.0)
Immature Granulocytes: 0 %
Lymphocytes Relative: 24 %
Lymphs Abs: 2.2 10*3/uL (ref 0.7–4.0)
MCH: 23.9 pg — ABNORMAL LOW (ref 26.0–34.0)
MCHC: 31.7 g/dL (ref 30.0–36.0)
MCV: 75.3 fL — ABNORMAL LOW (ref 80.0–100.0)
Monocytes Absolute: 1.7 10*3/uL — ABNORMAL HIGH (ref 0.1–1.0)
Monocytes Relative: 19 %
Neutro Abs: 5.1 10*3/uL (ref 1.7–7.7)
Neutrophils Relative %: 57 %
Platelets: 63 10*3/uL — ABNORMAL LOW (ref 150–400)
RBC: 2.47 MIL/uL — ABNORMAL LOW (ref 4.22–5.81)
RDW: 29.5 % — ABNORMAL HIGH (ref 11.5–15.5)
WBC: 9.1 10*3/uL (ref 4.0–10.5)
nRBC: 0 % (ref 0.0–0.2)

## 2022-05-10 LAB — COMPREHENSIVE METABOLIC PANEL
ALT: 10 U/L (ref 0–44)
AST: 27 U/L (ref 15–41)
Albumin: 2.6 g/dL — ABNORMAL LOW (ref 3.5–5.0)
Alkaline Phosphatase: 79 U/L (ref 38–126)
Anion gap: 10 (ref 5–15)
BUN: 15 mg/dL (ref 6–20)
CO2: 22 mmol/L (ref 22–32)
Calcium: 8.1 mg/dL — ABNORMAL LOW (ref 8.9–10.3)
Chloride: 98 mmol/L (ref 98–111)
Creatinine, Ser: 0.48 mg/dL — ABNORMAL LOW (ref 0.61–1.24)
GFR, Estimated: >60 mL/min (ref >60)
Glucose, Bld: 163 mg/dL — ABNORMAL HIGH (ref 70–99)
Potassium: 3.1 mmol/L — ABNORMAL LOW (ref 3.5–5.1)
Sodium: 130 mmol/L — ABNORMAL LOW (ref 135–145)
Total Bilirubin: 7 mg/dL — ABNORMAL HIGH (ref 0.3–1.2)
Total Protein: 6.1 g/dL — ABNORMAL LOW (ref 6.5–8.1)

## 2022-05-10 LAB — HEMOGLOBIN AND HEMATOCRIT, BLOOD
HCT: 21.3 % — ABNORMAL LOW (ref 39.0–52.0)
HCT: 28.2 % — ABNORMAL LOW (ref 39.0–52.0)
HCT: 28.4 % — ABNORMAL LOW (ref 39.0–52.0)
Hemoglobin: 6.7 g/dL — CL (ref 13.0–17.0)
Hemoglobin: 9.3 g/dL — ABNORMAL LOW (ref 13.0–17.0)
Hemoglobin: 9.3 g/dL — ABNORMAL LOW (ref 13.0–17.0)

## 2022-05-10 LAB — AMMONIA: Ammonia: 182 umol/L — ABNORMAL HIGH (ref 9–35)

## 2022-05-10 LAB — MAGNESIUM: Magnesium: 2 mg/dL (ref 1.7–2.4)

## 2022-05-10 LAB — PREPARE RBC (CROSSMATCH)

## 2022-05-10 MED ORDER — SODIUM CHLORIDE 0.9% IV SOLUTION: Freq: Once | INTRAVENOUS | Status: AC

## 2022-05-10 MED ORDER — POTASSIUM CHLORIDE CRYS ER 20 MEQ PO TBCR
40.0000 meq | EXTENDED_RELEASE_TABLET | Freq: Once | ORAL | Status: AC
  Administered 2022-05-10: 40 meq via ORAL
  Filled 2022-05-10: qty 2

## 2022-05-10 NOTE — Telephone Encounter (Signed)
----- Message from Irving Copas., MD sent at 05/10/2022  4:09 PM EDT ----- Regarding: RE: CT results Gary Mitchell, Please place this patient for December EUS next available linear. Thanks. GM ----- Message ----- From: [name], MD Sent: 05/09/2022   1:39 PM EDT To: Irving Copas., MD Subject: CT results                                     Repeat CT during this admission, ordered by hospitalist  Narrative & Impression CLINICAL DATA:  51 year old male presents with pancreatic lesion.   EXAM: CT ABDOMEN AND PELVIS WITH CONTRAST   TECHNIQUE: Multidetector CT imaging of the abdomen and pelvis was performed using the standard protocol following bolus administration of intravenous contrast.   RADIATION DOSE REDUCTION: This exam was performed according to the departmental dose-optimization program which includes automated exposure control, adjustment of the mA and/or kV according to patient size and/or use of iterative reconstruction technique.   CONTRAST:  167m OMNIPAQUE IOHEXOL 300 MG/ML  SOLN   COMPARISON:  Nov 14, 2021 MRI of the abdomen.   FINDINGS: Lower chest: Lung bases are clear.   No effusion.  No consolidation or chest wall abnormality.   Hepatobiliary: Cirrhotic hepatic morphology. No focal, suspicious hepatic lesion. Portal vein is patent. Recanalized umbilical vein with large body wall collaterals extending from the umbilicus LEFT inferior epigastric vein.   Pancreas: Cystic lesion in the junction of body-tail of the pancreas measuring approximate 3.8 x 3.3 cm is within 1 mm of previous measurement and is associated as on the prior study with marked dilation of the distal pancreatic duct up to 8 mm with associated parenchymal atrophy of the peripheral pancreas. Nodular area at at the margin of the cystic lesion, not with in the cystic lesion but within pancreatic parenchyma (image 35/2) 8 x 9 mm. Mildly heterogeneous area on the arterial  phase adjacent to SMV (image 43/2) this measures 15 x 7 mm and enhances to a greater extent than does the remainder of the atrophied pancreas. There is mild loss of normal fatty tissue planes in the area surrounding the pancreas this is unchanged. Mild irregularity of the gastroduodenal artery is also noted. No adenopathy in the area about the pancreas. Mild fatty atrophy in the area of the pancreatic head and uncinate.   Spleen: Unremarkable.   Adrenals/Urinary Tract: Adrenal glands are normal.   Absent LEFT kidney. Hypertrophied RIGHT kidney. No focal, suspicious renal lesion.   Stomach/Bowel: No stranding adjacent to colon or small bowel. Appendix is normal.   Vascular/Lymphatic: Patent portal vein and SMV. Patent splenic vein. Aortic atherosclerosis without aneurysm. No adenopathy in the abdomen in the pelvis. Varices about the anterior abdominal wall similar to previous imaging.   Reproductive: Unremarkable by CT.   Other: No ascites.   Musculoskeletal: No acute bone finding. No destructive bone process. Spinal degenerative changes.   IMPRESSION: 1. Cystic lesion in the junction of the body-tail of the pancreas is within 1 mm of previous measurement and is associated with marked dilation of the distal pancreatic duct up to 8 mm and associated parenchymal atrophy of the peripheral pancreas. Findings may reflect pseudocyst from prior pancreatitis or cystic pancreatic neoplasm. Based on ductal transition in the mid pancreas the possibility of underlying occult neoplasm in addition to stricture is considered particularly at the site of ductal transition where there is variable pancreatic enhancement which  is vaguely nodular (image 35/2) this is on the more central aspect of the cystic lesion closer to the portal-splenic confluence. 2. Ultimately endoscopic ultrasound may be helpful to assess the margins, not within the cystic lesion but at the site of ductal transition in  the pancreas to exclude small indolent occult neoplasm which may be leading to ductal obstruction. 3. Other area of variable enhancement in the head of the pancreas adjacent SMV could also be assessed at this time though this is favored to represent bowel vary between more "normal" pancreas and areas of fatty atrophy in the head uncinate process. This could also be assessed with endoscopic veins. 4. Loss of normal tissue planes about the pancreas without substantial change. The likely related to prior pancreatitis. 5. Signs of hepatic cirrhosis with marked portal hypertension and large body wall collaterals.   Aortic Atherosclerosis (ICD10-I70.0).     Electronically Signed   By: Zetta Bills M.D.   On: 05/09/2022 08:48

## 2022-05-10 NOTE — Progress Notes (Addendum)
Gastroenterology Progress Note  CC:  Cirrhosis and GI bleed  Subjective:  Per nursing note from 7 AM today:  Patient had at least 4 black and bloody stools throughout the nightshift. Made on call provider aware.   Patient demanding to eat.  He refused his insulin while I was in the room with him and the nurse.  Hgb 5.9 grams this AM and received 2 units PRBCs.  Objective:  Vital signs in last 24 hours: Temp:  [98.2 F (36.8 C)-99.3 F (37.4 C)] 98.6 F (37 C) (11/03 1113) Pulse Rate:  [87-99] 88 (11/03 1113) Resp:  [16-19] 16 (11/03 1113) BP: (93-115)/(52-72) 113/67 (11/03 1113) SpO2:  [97 %-100 %] 100 % (11/03 1113) Last BM Date : 05/10/22 General:  Alert, in NAD Neurologic:  Alert and oriented x 4;  grossly normal neurologically.  No asterixis.  Intake/Output from previous day: 11/02 0701 - 11/03 0700 In: 1543.3 [P.O.:480; I.V.:1063.3] Out: 300 [Urine:300] Intake/Output this shift: Total I/O In: 714.5 [Blood:714.5] Out: -   Lab Results: Recent Labs    05/09/22 0524 05/09/22 1220 05/09/22 1540 05/09/22 1800 05/09/22 2229 05/10/22 0258  WBC 4.3 5.7  --   --   --  9.1  HGB 7.6* 7.8*  --  7.2* 6.7* 5.9*  HCT 24.1* 25.7*  --  23.0* 21.3* 18.6*  PLT 87* 91* 83*  --   --  63*   BMET Recent Labs    05/09/22 0524 05/09/22 1220 05/09/22 1800 05/10/22 0258  NA 126* 126*  --  130*  K 5.7* 6.2* 4.4 3.1*  CL 102 100  --  98  CO2 14* 15*  --  22  GLUCOSE 173* 206*  --  163*  BUN 9 10  --  15  CREATININE 0.51* 0.55*  --  0.48*  CALCIUM 8.4* 8.5*  --  8.1*   LFT Recent Labs    05/10/22 0258  PROT 6.1*  ALBUMIN 2.6*  AST 27  ALT 10  ALKPHOS 79  BILITOT 7.0*   PT/INR Recent Labs    05/09/22 1540  LABPROT 18.8*  INR 1.6*   CT ABDOMEN PELVIS W CONTRAST  Result Date: 05/09/2022 CLINICAL DATA:  51 year old male presents with pancreatic lesion. EXAM: CT ABDOMEN AND PELVIS WITH CONTRAST TECHNIQUE: Multidetector CT imaging of the abdomen and  pelvis was performed using the standard protocol following bolus administration of intravenous contrast. RADIATION DOSE REDUCTION: This exam was performed according to the departmental dose-optimization program which includes automated exposure control, adjustment of the mA and/or kV according to patient size and/or use of iterative reconstruction technique. CONTRAST:  139m OMNIPAQUE IOHEXOL 300 MG/ML  SOLN COMPARISON:  Nov 14, 2021 MRI of the abdomen. FINDINGS: Lower chest: Lung bases are clear. No effusion.  No consolidation or chest wall abnormality. Hepatobiliary: Cirrhotic hepatic morphology. No focal, suspicious hepatic lesion. Portal vein is patent. Recanalized umbilical vein with large body wall collaterals extending from the umbilicus LEFT inferior epigastric vein. Pancreas: Cystic lesion in the junction of body-tail of the pancreas measuring approximate 3.8 x 3.3 cm is within 1 mm of previous measurement and is associated as on the prior study with marked dilation of the distal pancreatic duct up to 8 mm with associated parenchymal atrophy of the peripheral pancreas. Nodular area at at the margin of the cystic lesion, not with in the cystic lesion but within pancreatic parenchyma (image 35/2) 8 x 9 mm. Mildly heterogeneous area on the arterial phase adjacent to SMV (  image 43/2) this measures 15 x 7 mm and enhances to a greater extent than does the remainder of the atrophied pancreas. There is mild loss of normal fatty tissue planes in the area surrounding the pancreas this is unchanged. Mild irregularity of the gastroduodenal artery is also noted. No adenopathy in the area about the pancreas. Mild fatty atrophy in the area of the pancreatic head and uncinate. Spleen: Unremarkable. Adrenals/Urinary Tract: Adrenal glands are normal. Absent LEFT kidney. Hypertrophied RIGHT kidney. No focal, suspicious renal lesion. Stomach/Bowel: No stranding adjacent to colon or small bowel. Appendix is normal.  Vascular/Lymphatic: Patent portal vein and SMV. Patent splenic vein. Aortic atherosclerosis without aneurysm. No adenopathy in the abdomen in the pelvis. Varices about the anterior abdominal wall similar to previous imaging. Reproductive: Unremarkable by CT. Other: No ascites. Musculoskeletal: No acute bone finding. No destructive bone process. Spinal degenerative changes. IMPRESSION: 1. Cystic lesion in the junction of the body-tail of the pancreas is within 1 mm of previous measurement and is associated with marked dilation of the distal pancreatic duct up to 8 mm and associated parenchymal atrophy of the peripheral pancreas. Findings may reflect pseudocyst from prior pancreatitis or cystic pancreatic neoplasm. Based on ductal transition in the mid pancreas the possibility of underlying occult neoplasm in addition to stricture is considered particularly at the site of ductal transition where there is variable pancreatic enhancement which is vaguely nodular (image 35/2) this is on the more central aspect of the cystic lesion closer to the portal-splenic confluence. 2. Ultimately endoscopic ultrasound may be helpful to assess the margins, not within the cystic lesion but at the site of ductal transition in the pancreas to exclude small indolent occult neoplasm which may be leading to ductal obstruction. 3. Other area of variable enhancement in the head of the pancreas adjacent SMV could also be assessed at this time though this is favored to represent bowel vary between more "normal" pancreas and areas of fatty atrophy in the head uncinate process. This could also be assessed with endoscopic veins. 4. Loss of normal tissue planes about the pancreas without substantial change. The likely related to prior pancreatitis. 5. Signs of hepatic cirrhosis with marked portal hypertension and large body wall collaterals. Aortic Atherosclerosis (ICD10-I70.0). Electronically Signed   By: Zetta Bills M.D.   On: 05/09/2022  08:48    Assessment / Plan: 51 year old male with alcoholic cirrhosis with history of hepatic encephalopathy and small nonbleeding varices, no ascites and suspicious pancreatic lesion admitted with symptomatic anemia with hemoglobin 5, initially without overt bleeding.  EGD October 31 unremarkable, with very mild portal hypertensive gastropathy and small esophageal varices.  Colonoscopy November 1 with blood and clot throughout the colon, as well as in the terminal ileum, suggestive of bleeding source within the small intestine.  He swallowed pill camera 11/2, results will be hopefully be reviewed today.  Hgb 5.9 grams this AM, transfused 2 units PRBCs.  Had several back/bloody stools overnight and this AM.   He had a significant rise in his total bilirubin levels since admission (3 to 11), with otherwise stability of his liver enzymes and no biliary obstruction noted on repeat CT scan.  Fractionated bilirubin showed equal distribution of direct and indirect bilirubin levels.  He was noted to be hyperkalemic 11/2.  LDH unremarkable, haptoglobin pending.  Exact etiology for his hyperbilirubinemia unclear, but suspect combination of cirrhosis and hemolysis.  Total bili actually down from 11.3 yesterday to 7.0 today.   He has a suspicious pancreatic  lesion with nodular component and abrupt ductal transition.  He will likely require repeat endoscopic ultrasound to resample this area.  If the patient is still an inpatient on Monday, Dr. Rush Landmark may be available to repeat endoscopic ultrasound.  Otherwise, will likely be several weeks before an outpatient EUS could be performed.   Iron deficiency anemia/GI bleeding - Suspect small intestine source (AVM?) - Capsule endoscopy administered 11/2, will hopefully be read today. --Hgb 5.9 grams this AM, transfused 2 units PRBCs.  Had several back/bloody stools overnight and this AM. - Trend CBC, transfuse further for hemoglobin less than 7 grams.   Cirrhosis,  no ascites, no history of variceal bleeding - Not encephalopathic on exam, no asterixis but elevated ammonia up to 182 today (he had been refusing his lactulose). - Continue lactulose 20 grams TID for now, titrate 2-3 bowel movements per day.   Hyperbilirubinemia above baseline:  Actually down from 11.3 yesterday to 7.0 today - Suspect multifactorial from cirrhosis and hemolysis from transfusions - Other liver enzymes and INR essentially stable - No evidence of biliary obstruction on CT - Continue to monitor - LDH normal, haptoglobin pending   Suspicious pancreatic lesion, mildly elevated CA 19-9 - Likely repeat endoscopic ultrasound, possibly Monday if still inpatient   Hyponatremia, hyperkalemia:  Na+ 130 and K+ actually low now today at 3.1 - Defer to hospitalist for correction    LOS: 4 days   Jessica D. Zehr  05/10/2022, 11:41 AM   --------------------------------------------------------------------------------  I have taken a history, reviewed the chart and examined the patient. I performed a substantive portion of this encounter, including complete performance of at least one of the key components, in conjunction with the APP. I agree with the APP's note, impression and recommendations  Patient demonstrating ongoing active bleeding overnight, requiring 2 more units pRBCs. I advanced his diet this afternoon due to his malcontent with ongoing liquid diet, and the absence of a high risk bleeding lesion seen in the upper GI tract previously. Video capsule reviewed this evening and it showed red blood with clot in the stomach, copious fresh blood in the duodenum and dark red blood throughout the small intestine and cecum.  No identifiable lesion other than biopsy sites from EGD (no ulcer, mass). I think it is okay for the patient to still have a regular dinner tonight, but then NPO after midnight - Will plan for push enteroscopy to identify bleeding source and provide hemostasis. -  NPO after midnight - Continue Protonix BID IV  Hyperbilirubinemia much improved from yesterday to today.  These drastic shifts are highly unlikely to be solely from cirrhosis/hepatic dysfunction  Ammonia level elevated, but has not been clinically encephalopathic on exam and has no asterixis.  Ok for holding lactulose when patient has has had > 3 BMs  Daeja Helderman E. Candis Schatz, MD [facility] Gastroenter[facility]

## 2022-05-10 NOTE — Telephone Encounter (Signed)
EUS scheduled for 06/13/22 at 9 am at [facility] with GM   Left message on machine to call back

## 2022-05-10 NOTE — Progress Notes (Signed)
[facility]                                                                               Gary Mitchell, is a 51 y.o. 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 He had outpatient lab work checked and was found to have critical low hemoglobin and was referred to the ER. On work-up he was found to have hemoglobin 5 g/dL . He underwent a total of 6 units of pRBC transfusion.  GI was also consulted on admission. Patient underwent EGD, Esophageal varices and mild portal gastropathy He also underwent colonoscopy showing blood in the terminal ileum. Blood in the entire examined colon and 8 mm polyp in th descending colon , removed with a cold snare. Capsule endoscopy done on 05/09/22 and results pending.  During his hospitalization , he continues to have intermittent rectal bleeding, melanotic stools and hematemesis.      Assessment & Plan    Assessment and Plan:  Iron deficiency anemia/ Acute anemia of blood loss:  Hemoglobin at 5 on admission. S/P 4 units of prbc transfusion and hemoglobin around 7 with per sistent hematemesis and rectal bleeding and melanotic stools.  GI consulted, underwent EGD shows grade I esophageal varices and mild portal gastropathy. Gastric bx pending to rule out H.pylori. Colonoscopy shows Blood in the entire examined colon. One 8 mm polyp in the descending colon, removed with a cold snare. Resected and retrieved. Clip (MR conditional) was placed. Blood in the terminal ileum, likely indicating bleeding from the small intestine. Intestinal AVMs could explain profound iron deficiency anemia as well as intermittent  overt bleeding. Capsule endoscopy done, await results.  Overnight patient had about 4 melanotic stools mixed with blood and hemoglobin dropped from 7. 6 to 5.9. 2 units of prbc transfusion ordered. One unit given, another unit on the way. Check H&H post transfusion.   - transfuse to keep hemoglobin greater than 7, check H&H every 12 hours.     Elevated total Bilirubin:  Possibly from cirrhosis, .  Patient had elevated bilirubin in the past as high as 28. Bilirubin improved to 7 today.  INR is 1.6, LDH is wnl, haptoglobin is pending. DIC panel shows normal d dimer, fibrinogen slightly low, and schistocytes present. Curbside with Dr Benay Spice, suggested elevated levels probably from cirrhosis, and less likely hemolysis. Peripheral smear ordered.  CT abd and pelvis with pancreas protocol done showed suspicious pancreatic lesion with nodular component with abrupt ductal transition. Plan for repeat endoscopic [place] to resample the area.      Thrombocytopenia: from cirrhosis.  Worsening thrombocytopenia from 111 to 87 to 91 to 83 to 63   Portal hypertension Esophageal varices History of hepatic encephalopathy Cirrhosis - non compliance with lactulose  - s/p recent EUS July 2023 with varices noted. - target atleast  2 bm daily. Pt refused lactulose the last 2 days.  - ammonia level high this am.     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 Showing suspicious pancreatic lesion with nodular component with abrupt ductal transition. Plan for repeat endoscopic [place] to resample the area.  EUS ON 7/23  shows thickening, no masses.  Repeat EUS this Monday with possible brushings. .    Type 2 DM:  CBG (last 3)  Recent Labs    05/10/22 0053 05/10/22 0526 05/10/22 0747  GLUCAP 223* 160* 120*    Resume SSI. No change in meds.    Hypertension:  Well controlled. No changes in meds.    Hypokalemia: replaced.    Seizures;  On keppra.    GERD ON PPI.     Hypomagnesemia:  Replaced. Repeat levels wnl.    Hyperkalemia:  Unclear etiology.  Lokelma given and repeat levels this evening wnl.      RN Pressure Injury Documentation:    Malnutrition Type:  Nutrition Problem: Moderate Malnutrition Etiology: chronic illness (alcoholic cirrhosis)   Malnutrition Characteristics:  Signs/Symptoms: energy intake < or equal to 75% for > or equal to 1 month, mild fat depletion, mild muscle depletion   Nutrition Interventions:  Interventions: Other (Comment) (Ensure Max)  Estimated body mass index is 23.84 kg/m as calculated from the following:   Height as of this encounter: '5\' 4"'$  (1.626 m).   Weight as of this encounter: 63 kg.  Code Status: full code.  DVT Prophylaxis:  SCDs Start: 05/06/22 0308   Level of Care: Level of care: Progressive Family Communication: none at bedside.   Disposition Plan:     Remains inpatient appropriate:  colonoscopy,  Procedures:  Egd colonoscopy  Consultants:   GI  Antimicrobials:   Anti-infectives (From admission, onward)    None        Medications  Scheduled Meds:  folic acid  1 mg Oral Daily   gabapentin  300 mg Oral QHS   insulin aspart  0-9 Units Subcutaneous Q4H   lactulose  20 g Oral TID   levETIRAcetam  500 mg Oral BID   multivitamin with minerals  1 tablet Oral Daily   pantoprazole (PROTONIX) IV  40 mg Intravenous Q12H   thiamine  100 mg Oral Daily   Or   thiamine  100 mg Intravenous Daily   Continuous Infusions:  sodium chloride Stopped (05/05/22 2327)   lactated ringers 10 mL/hr at 05/08/22 1312   sodium bicarbonate 150 mEq in sterile water 1,150 mL infusion 75 mL/hr at 05/10/22 0539   PRN Meds:.hydrALAZINE, HYDROcodone-acetaminophen, labetalol, ondansetron (ZOFRAN) IV, mouth rinse    Subjective:   Gary Mitchell was seen and examined today.  Intermittent nausea , no abdominal cramping . No vomiting this am.  Eating eggs this morning.  Objective:    Vitals:   05/10/22 0514 05/10/22 0745 05/10/22 0837 05/10/22 0852  BP: (!) 93/52 1'06/72 98/66 98/67 '$  Pulse: 89 95 98 99  Resp: '17 16 18 16  '$ Temp: 99.3 F (37.4 C) 98.2 F (36.8 C) 98.2 F (36.8 C) 98.3 F (36.8 C)  TempSrc: Oral Oral Oral Oral  SpO2: 100% 100%  100%  Weight:      Height:        Intake/Output Summary (Last 24 hours) at 05/10/2022 0954 Last data filed at 05/10/2022 0745 Gross per 24 hour  Intake 1875.34 ml  Output 300 ml  Net 1575.34 ml    [employer]  05/06/22 0409 05/07/22 1313 05/09/22 0815  Weight: 63 kg 63 kg 63 kg     Exam General exam: Ill appearing gentleman, not in distress.  Respiratory system: Clear to auscultation. Respiratory effort normal. Cardiovascular system: S1 & S2 heard, RRR. No JVD,  No pedal edema. Gastrointestinal system: Abdomen is nondistended, soft and nontender.  Normal bowel sounds heard. Central nervous system: Alert and oriented. No focal neurological deficits. Extremities: Symmetric 5 x 5 power. Skin: No rashes, lesions or ulcers Psychiatry: Mood & affect appropriate.      Data Reviewed:  I have personally reviewed following labs and imaging studies   CBC Lab Results  Component Value Date   WBC 9.1 05/10/2022   RBC 2.47 (L) 05/10/2022   HGB 5.9 (LL) 05/10/2022   HCT 18.6 (L) 05/10/2022   MCV 75.3 (L) 05/10/2022   MCH 23.9 (L) 05/10/2022   PLT 63 (L) 05/10/2022   MCHC 31.7 05/10/2022   RDW 29.5 (H) 05/10/2022   LYMPHSABS 2.2 05/10/2022   MONOABS 1.7 (H) 05/10/2022   EOSABS 0.0 05/10/2022   BASOSABS 0.0 44/07/270     Last metabolic panel Lab Results  Component Value Date   NA 130 (L) 05/10/2022   K 3.1 (L) 05/10/2022   CL 98 05/10/2022   CO2 22 05/10/2022   BUN 15 05/10/2022   CREATININE 0.48 (L) 05/10/2022   GLUCOSE 163 (H) 05/10/2022   GFRNONAA >60 05/10/2022   GFRAA 147 01/12/2020   CALCIUM 8.1 (L) 05/10/2022   PHOS 3.1 05/06/2022   PROT 6.1 (L) 05/10/2022   ALBUMIN 2.6 (L) 05/10/2022    LABGLOB 4.6 (H) 08/22/2021   AGRATIO 0.8 (L) 08/22/2021   BILITOT 7.0 (H) 05/10/2022   ALKPHOS 79 05/10/2022   AST 27 05/10/2022   ALT 10 05/10/2022   ANIONGAP 10 05/10/2022    CBG (last 3)  Recent Labs    05/10/22 0053 05/10/22 0526 05/10/22 0747  GLUCAP 223* 160* 120*       Coagulation Profile: Recent Labs  Lab 05/03/22 1002 05/05/22 2359 05/06/22 0452 05/09/22 1540  INR 1.5* 1.4* 1.5* 1.6*      Radiology Studies: CT ABDOMEN PELVIS W CONTRAST  Result Date: 05/09/2022 CLINICAL DATA:  51 year old male presents with pancreatic lesion. EXAM: CT ABDOMEN AND PELVIS WITH CONTRAST TECHNIQUE: Multidetector CT imaging of the abdomen and pelvis was performed using the standard protocol following bolus administration of intravenous contrast. RADIATION DOSE REDUCTION: This exam was performed according to the departmental dose-optimization program which includes automated exposure control, adjustment of the mA and/or kV according to patient size and/or use of iterative reconstruction technique. CONTRAST:  120m OMNIPAQUE IOHEXOL 300 MG/ML  SOLN COMPARISON:  Nov 14, 2021 MRI of the abdomen. FINDINGS: Lower chest: Lung bases are clear. No effusion.  No consolidation or chest wall abnormality. Hepatobiliary: Cirrhotic hepatic morphology. No focal, suspicious hepatic lesion. Portal vein is patent. Recanalized umbilical vein with large body wall collaterals extending from the umbilicus LEFT inferior epigastric vein. Pancreas: Cystic lesion in the junction of body-tail of the pancreas measuring approximate 3.8 x 3.3 cm is within 1 mm of previous measurement and is associated as on the prior study with marked dilation of the distal pancreatic duct up to 8 mm with associated parenchymal atrophy of the peripheral pancreas. Nodular area at at the margin of the cystic lesion, not with in the cystic lesion but within pancreatic parenchyma (image 35/2) 8 x 9 mm. Mildly heterogeneous area on the arterial  phase adjacent to SMV (  image 43/2) this measures 15 x 7 mm and enhances to a greater extent than does the remainder of the atrophied pancreas. There is mild loss of normal fatty tissue planes in the area surrounding the pancreas this is unchanged. Mild irregularity of the gastroduodenal artery is also noted. No adenopathy in the area about the pancreas. Mild fatty atrophy in the area of the pancreatic head and uncinate. Spleen: Unremarkable. Adrenals/Urinary Tract: Adrenal glands are normal. Absent LEFT kidney. Hypertrophied RIGHT kidney. No focal, suspicious renal lesion. Stomach/Bowel: No stranding adjacent to colon or small bowel. Appendix is normal. Vascular/Lymphatic: Patent portal vein and SMV. Patent splenic vein. Aortic atherosclerosis without aneurysm. No adenopathy in the abdomen in the pelvis. Varices about the anterior abdominal wall similar to previous imaging. Reproductive: Unremarkable by CT. Other: No ascites. Musculoskeletal: No acute bone finding. No destructive bone process. Spinal degenerative changes. IMPRESSION: 1. Cystic lesion in the junction of the body-tail of the pancreas is within 1 mm of previous measurement and is associated with marked dilation of the distal pancreatic duct up to 8 mm and associated parenchymal atrophy of the peripheral pancreas. Findings may reflect pseudocyst from prior pancreatitis or cystic pancreatic neoplasm. Based on ductal transition in the mid pancreas the possibility of underlying occult neoplasm in addition to stricture is considered particularly at the site of ductal transition where there is variable pancreatic enhancement which is vaguely nodular (image 35/2) this is on the more central aspect of the cystic lesion closer to the portal-splenic confluence. 2. Ultimately endoscopic ultrasound may be helpful to assess the margins, not within the cystic lesion but at the site of ductal transition in the pancreas to exclude small indolent occult neoplasm which  may be leading to ductal obstruction. 3. Other area of variable enhancement in the head of the pancreas adjacent SMV could also be assessed at this time though this is favored to represent bowel vary between more "normal" pancreas and areas of fatty atrophy in the head uncinate process. This could also be assessed with endoscopic veins. 4. Loss of normal tissue planes about the pancreas without substantial change. The likely related to prior pancreatitis. 5. Signs of hepatic cirrhosis with marked portal hypertension and large body wall collaterals. Aortic Atherosclerosis (ICD10-I70.0). Electronically Signed   By: Zetta Bills M.D.   On: 05/09/2022 08:48       Hosie Poisson M.D. [facility] 05/10/2022, 9:54 AM  Available via Epic secure chat 7am-7pm After 7 pm, please refer to night coverage provider listed on amion.

## 2022-05-10 NOTE — Progress Notes (Signed)
Patient had at least 4 black and bloody stools throughout the nightshift. Made on call provider aware. On call provider put in new orders throughout the night.

## 2022-05-10 NOTE — H&P (View-Only) (Signed)
[facility] Progress Note  CC:  Cirrhosis and GI bleed  Subjective:  Per nursing note from 7 AM today:  Patient had at least 4 black and bloody stools throughout the nightshift. Made on call provider aware.   Patient demanding to eat.  He refused his insulin while I was in the room with him and the nurse.  Hgb 5.9 grams this AM and received 2 units PRBCs.  Objective:  Vital signs in last 24 hours: Temp:  [98.2 F (36.8 C)-99.3 F (37.4 C)] 98.6 F (37 C) (11/03 1113) Pulse Rate:  [87-99] 88 (11/03 1113) Resp:  [16-19] 16 (11/03 1113) BP: (93-115)/(52-72) 113/67 (11/03 1113) SpO2:  [97 %-100 %] 100 % (11/03 1113) Last BM Date : 05/10/22 General:  Alert, in NAD Neurologic:  Alert and oriented x 4;  grossly normal neurologically.  No asterixis.  Intake/Output from previous day: 11/02 0701 - 11/03 0700 In: 1543.3 [P.O.:480; I.V.:1063.3] Out: 300 [Urine:300] Intake/Output this shift: Total I/O In: 714.5 [Blood:714.5] Out: -   Lab Results: Recent Labs    05/09/22 0524 05/09/22 1220 05/09/22 1540 05/09/22 1800 05/09/22 2229 05/10/22 0258  WBC 4.3 5.7  --   --   --  9.1  HGB 7.6* 7.8*  --  7.2* 6.7* 5.9*  HCT 24.1* 25.7*  --  23.0* 21.3* 18.6*  PLT 87* 91* 83*  --   --  63*   BMET Recent Labs    05/09/22 0524 05/09/22 1220 05/09/22 1800 05/10/22 0258  NA 126* 126*  --  130*  K 5.7* 6.2* 4.4 3.1*  CL 102 100  --  98  CO2 14* 15*  --  22  GLUCOSE 173* 206*  --  163*  BUN 9 10  --  15  CREATININE 0.51* 0.55*  --  0.48*  CALCIUM 8.4* 8.5*  --  8.1*   LFT Recent Labs    05/10/22 0258  PROT 6.1*  ALBUMIN 2.6*  AST 27  ALT 10  ALKPHOS 79  BILITOT 7.0*   PT/INR Recent Labs    05/09/22 1540  LABPROT 18.8*  INR 1.6*   CT ABDOMEN PELVIS W CONTRAST  Result Date: 05/09/2022 CLINICAL DATA:  51 year old male presents with pancreatic lesion. EXAM: CT ABDOMEN AND PELVIS WITH CONTRAST TECHNIQUE: Multidetector CT imaging of the abdomen and  pelvis was performed using the standard protocol following bolus administration of intravenous contrast. RADIATION DOSE REDUCTION: This exam was performed according to the departmental dose-optimization program which includes automated exposure control, adjustment of the mA and/or kV according to patient size and/or use of iterative reconstruction technique. CONTRAST:  122m OMNIPAQUE IOHEXOL 300 MG/ML  SOLN COMPARISON:  Nov 14, 2021 MRI of the abdomen. FINDINGS: Lower chest: Lung bases are clear. No effusion.  No consolidation or chest wall abnormality. Hepatobiliary: Cirrhotic hepatic morphology. No focal, suspicious hepatic lesion. Portal vein is patent. Recanalized umbilical vein with large body wall collaterals extending from the umbilicus LEFT inferior epigastric vein. Pancreas: Cystic lesion in the junction of body-tail of the pancreas measuring approximate 3.8 x 3.3 cm is within 1 mm of previous measurement and is associated as on the prior study with marked dilation of the distal pancreatic duct up to 8 mm with associated parenchymal atrophy of the peripheral pancreas. Nodular area at at the margin of the cystic lesion, not with in the cystic lesion but within pancreatic parenchyma (image 35/2) 8 x 9 mm. Mildly heterogeneous area on the arterial phase adjacent to SMV (  image 43/2) this measures 15 x 7 mm and enhances to a greater extent than does the remainder of the atrophied pancreas. There is mild loss of normal fatty tissue planes in the area surrounding the pancreas this is unchanged. Mild irregularity of the gastroduodenal artery is also noted. No adenopathy in the area about the pancreas. Mild fatty atrophy in the area of the pancreatic head and uncinate. Spleen: Unremarkable. Adrenals/Urinary Tract: Adrenal glands are normal. Absent LEFT kidney. Hypertrophied RIGHT kidney. No focal, suspicious renal lesion. Stomach/Bowel: No stranding adjacent to colon or small bowel. Appendix is normal.  Vascular/Lymphatic: Patent portal vein and SMV. Patent splenic vein. Aortic atherosclerosis without aneurysm. No adenopathy in the abdomen in the pelvis. Varices about the anterior abdominal wall similar to previous imaging. Reproductive: Unremarkable by CT. Other: No ascites. Musculoskeletal: No acute bone finding. No destructive bone process. Spinal degenerative changes. IMPRESSION: 1. Cystic lesion in the junction of the body-tail of the pancreas is within 1 mm of previous measurement and is associated with marked dilation of the distal pancreatic duct up to 8 mm and associated parenchymal atrophy of the peripheral pancreas. Findings may reflect pseudocyst from prior pancreatitis or cystic pancreatic neoplasm. Based on ductal transition in the mid pancreas the possibility of underlying occult neoplasm in addition to stricture is considered particularly at the site of ductal transition where there is variable pancreatic enhancement which is vaguely nodular (image 35/2) this is on the more central aspect of the cystic lesion closer to the portal-splenic confluence. 2. Ultimately endoscopic ultrasound may be helpful to assess the margins, not within the cystic lesion but at the site of ductal transition in the pancreas to exclude small indolent occult neoplasm which may be leading to ductal obstruction. 3. Other area of variable enhancement in the head of the pancreas adjacent SMV could also be assessed at this time though this is favored to represent bowel vary between more "normal" pancreas and areas of fatty atrophy in the head uncinate process. This could also be assessed with endoscopic veins. 4. Loss of normal tissue planes about the pancreas without substantial change. The likely related to prior pancreatitis. 5. Signs of hepatic cirrhosis with marked portal hypertension and large body wall collaterals. Aortic Atherosclerosis (ICD10-I70.0). Electronically Signed   By: Zetta Bills M.D.   On: 05/09/2022  08:48    Assessment / Plan: 51 year old male with alcoholic cirrhosis with history of hepatic encephalopathy and small nonbleeding varices, no ascites and suspicious pancreatic lesion admitted with symptomatic anemia with hemoglobin 5, initially without overt bleeding.  EGD October 31 unremarkable, with very mild portal hypertensive gastropathy and small esophageal varices.  Colonoscopy November 1 with blood and clot throughout the colon, as well as in the terminal ileum, suggestive of bleeding source within the small intestine.  He swallowed pill camera 11/2, results will be hopefully be reviewed today.  Hgb 5.9 grams this AM, transfused 2 units PRBCs.  Had several back/bloody stools overnight and this AM.   He had a significant rise in his total bilirubin levels since admission (3 to 11), with otherwise stability of his liver enzymes and no biliary obstruction noted on repeat CT scan.  Fractionated bilirubin showed equal distribution of direct and indirect bilirubin levels.  He was noted to be hyperkalemic 11/2.  LDH unremarkable, haptoglobin pending.  Exact etiology for his hyperbilirubinemia unclear, but suspect combination of cirrhosis and hemolysis.  Total bili actually down from 11.3 yesterday to 7.0 today.   He has a suspicious pancreatic  lesion with nodular component and abrupt ductal transition.  He will likely require repeat endoscopic ultrasound to resample this area.  If the patient is still an inpatient on Monday, Dr. Rush Landmark may be available to repeat endoscopic ultrasound.  Otherwise, will likely be several weeks before an outpatient EUS could be performed.   Iron deficiency anemia/GI bleeding - Suspect small intestine source (AVM?) - Capsule endoscopy administered 11/2, will hopefully be read today. --Hgb 5.9 grams this AM, transfused 2 units PRBCs.  Had several back/bloody stools overnight and this AM. - Trend CBC, transfuse further for hemoglobin less than 7 grams.   Cirrhosis,  no ascites, no history of variceal bleeding - Not encephalopathic on exam, no asterixis but elevated ammonia up to 182 today (he had been refusing his lactulose). - Continue lactulose 20 grams TID for now, titrate 2-3 bowel movements per day.   Hyperbilirubinemia above baseline:  Actually down from 11.3 yesterday to 7.0 today - Suspect multifactorial from cirrhosis and hemolysis from transfusions - Other liver enzymes and INR essentially stable - No evidence of biliary obstruction on CT - Continue to monitor - LDH normal, haptoglobin pending   Suspicious pancreatic lesion, mildly elevated CA 19-9 - Likely repeat endoscopic ultrasound, possibly Monday if still inpatient   Hyponatremia, hyperkalemia:  Na+ 130 and K+ actually low now today at 3.1 - Defer to hospitalist for correction    LOS: 4 days   Jessica D. Zehr  05/10/2022, 11:41 AM   --------------------------------------------------------------------------------  I have taken a history, reviewed the chart and examined the patient. I performed a substantive portion of this encounter, including complete performance of at least one of the key components, in conjunction with the APP. I agree with the APP's note, impression and recommendations  Patient demonstrating ongoing active bleeding overnight, requiring 2 more units pRBCs. I advanced his diet this afternoon due to his malcontent with ongoing liquid diet, and the absence of a high risk bleeding lesion seen in the upper GI tract previously. Video capsule reviewed this evening and it showed red blood with clot in the stomach, copious fresh blood in the duodenum and dark red blood throughout the small intestine and cecum.  No identifiable lesion other than biopsy sites from EGD (no ulcer, mass). I think it is okay for the patient to still have a regular dinner tonight, but then NPO after midnight - Will plan for push enteroscopy to identify bleeding source and provide hemostasis. -  NPO after midnight - Continue Protonix BID IV  Hyperbilirubinemia much improved from yesterday to today.  These drastic shifts are highly unlikely to be solely from cirrhosis/hepatic dysfunction  Ammonia level elevated, but has not been clinically encephalopathic on exam and has no asterixis.  Ok for holding lactulose when patient has has had > 3 BMs  Mecca Barga E. Candis Schatz, MD [facility]

## 2022-05-11 ENCOUNTER — Encounter (HOSPITAL_COMMUNITY)
Admission: EM | Disposition: A | Discharge status: Home / Self Care | Payer: Self-pay | Source: Home / Self Care | Attending: Internal Medicine

## 2022-05-11 ENCOUNTER — Inpatient Hospital Stay (HOSPITAL_COMMUNITY): Payer: 59 | Admitting: Anesthesiology

## 2022-05-11 DIAGNOSIS — K3189 Other diseases of stomach and duodenum: Secondary | ICD-10-CM

## 2022-05-11 DIAGNOSIS — E119 Type 2 diabetes mellitus without complications: Secondary | ICD-10-CM

## 2022-05-11 DIAGNOSIS — K766 Portal hypertension: Secondary | ICD-10-CM

## 2022-05-11 DIAGNOSIS — Z515 Encounter for palliative care: Secondary | ICD-10-CM

## 2022-05-11 DIAGNOSIS — Z7189 Other specified counseling: Secondary | ICD-10-CM

## 2022-05-11 DIAGNOSIS — Z7984 Long term (current) use of oral hypoglycemic drugs: Secondary | ICD-10-CM

## 2022-05-11 DIAGNOSIS — I8511 Secondary esophageal varices with bleeding: Secondary | ICD-10-CM

## 2022-05-11 DIAGNOSIS — K274 Chronic or unspecified peptic ulcer, site unspecified, with hemorrhage: Secondary | ICD-10-CM

## 2022-05-11 HISTORY — PX: HEMOSTASIS CLIP PLACEMENT: SHX6857

## 2022-05-11 HISTORY — PX: ENTEROSCOPY: SHX5533

## 2022-05-11 LAB — COMPREHENSIVE METABOLIC PANEL
ALT: 14 U/L (ref 0–44)
AST: 37 U/L (ref 15–41)
Albumin: 2.7 g/dL — ABNORMAL LOW (ref 3.5–5.0)
Alkaline Phosphatase: 80 U/L (ref 38–126)
Anion gap: 9 (ref 5–15)
BUN: 9 mg/dL (ref 6–20)
CO2: 27 mmol/L (ref 22–32)
Calcium: 8.6 mg/dL — ABNORMAL LOW (ref 8.9–10.3)
Chloride: 95 mmol/L — ABNORMAL LOW (ref 98–111)
Creatinine, Ser: 0.49 mg/dL — ABNORMAL LOW (ref 0.61–1.24)
GFR, Estimated: >60 mL/min (ref >60)
Glucose, Bld: 110 mg/dL — ABNORMAL HIGH (ref 70–99)
Potassium: 3.5 mmol/L (ref 3.5–5.1)
Sodium: 131 mmol/L — ABNORMAL LOW (ref 135–145)
Total Bilirubin: 7.7 mg/dL — ABNORMAL HIGH (ref 0.3–1.2)
Total Protein: 6.2 g/dL — ABNORMAL LOW (ref 6.5–8.1)

## 2022-05-11 LAB — GLUCOSE, CAPILLARY
Glucose-Capillary: 126 mg/dL — ABNORMAL HIGH (ref 70–99)
Glucose-Capillary: 127 mg/dL — ABNORMAL HIGH (ref 70–99)
Glucose-Capillary: 113 mg/dL — ABNORMAL HIGH (ref 70–99)
Glucose-Capillary: 148 mg/dL — ABNORMAL HIGH (ref 70–99)
Glucose-Capillary: 123 mg/dL — ABNORMAL HIGH (ref 70–99)
Glucose-Capillary: 216 mg/dL — ABNORMAL HIGH (ref 70–99)

## 2022-05-11 LAB — CBC WITH DIFFERENTIAL/PLATELET
Abs Immature Granulocytes: 0.02 10*3/uL (ref 0.00–0.07)
Basophils Absolute: 0.1 10*3/uL (ref 0.0–0.1)
Basophils Relative: 1 %
Eosinophils Absolute: 0.1 10*3/uL (ref 0.0–0.5)
Eosinophils Relative: 2 %
HCT: 26.1 % — ABNORMAL LOW (ref 39.0–52.0)
Hemoglobin: 8.5 g/dL — ABNORMAL LOW (ref 13.0–17.0)
Immature Granulocytes: 0 %
Lymphocytes Relative: 28 %
Lymphs Abs: 1.8 10*3/uL (ref 0.7–4.0)
MCH: 26.1 pg (ref 26.0–34.0)
MCHC: 32.6 g/dL (ref 30.0–36.0)
MCV: 80.1 fL (ref 80.0–100.0)
Monocytes Absolute: 1.5 10*3/uL — ABNORMAL HIGH (ref 0.1–1.0)
Monocytes Relative: 22 %
Neutro Abs: 3.1 10*3/uL (ref 1.7–7.7)
Neutrophils Relative %: 47 %
Platelets: 46 10*3/uL — ABNORMAL LOW (ref 150–400)
RBC: 3.26 MIL/uL — ABNORMAL LOW (ref 4.22–5.81)
RDW: 28.1 % — ABNORMAL HIGH (ref 11.5–15.5)
WBC: 6.6 10*3/uL (ref 4.0–10.5)
nRBC: 0 % (ref 0.0–0.2)

## 2022-05-11 LAB — MAGNESIUM: Magnesium: 1.4 mg/dL — ABNORMAL LOW (ref 1.7–2.4)

## 2022-05-11 LAB — BPAM RBC
Blood Product Expiration Date: 202311262359
Blood Product Expiration Date: 202312052359
ISSUE DATE / TIME: 202311030448
ISSUE DATE / TIME: 202311030830
Unit Type and Rh: 5100
Unit Type and Rh: 5100

## 2022-05-11 LAB — TYPE AND SCREEN
ABO/RH(D): O POS
Antibody Screen: NEGATIVE
Unit division: 0
Unit division: 0

## 2022-05-11 LAB — HAPTOGLOBIN: Haptoglobin: 71 mg/dL (ref 23–355)

## 2022-05-11 SURGERY — ENTEROSCOPY
Anesthesia: Monitor Anesthesia Care

## 2022-05-11 MED ORDER — PROPOFOL 500 MG/50ML IV EMUL
INTRAVENOUS | Status: DC | PRN
  Administered 2022-05-11: 100 ug/kg/min via INTRAVENOUS

## 2022-05-11 MED ORDER — PROPOFOL 10 MG/ML IV BOLUS
INTRAVENOUS | Status: DC | PRN
  Administered 2022-05-11: 20 mg via INTRAVENOUS
  Administered 2022-05-11: 10 mg via INTRAVENOUS
  Administered 2022-05-11 (×2): 20 mg via INTRAVENOUS
  Administered 2022-05-11: 10 mg via INTRAVENOUS
  Administered 2022-05-11 (×2): 20 mg via INTRAVENOUS

## 2022-05-11 MED ORDER — MAGNESIUM SULFATE 4 GM/100ML IV SOLN
4.0000 g | Freq: Once | INTRAVENOUS | Status: AC
  Administered 2022-05-11: 4 g via INTRAVENOUS
  Filled 2022-05-11: qty 100

## 2022-05-11 MED ORDER — SUCRALFATE 1 GM/10ML PO SUSP
1.0000 g | Freq: Three times a day (TID) | ORAL | Status: DC
  Administered 2022-05-11 – 2022-05-12 (×4): 1 g via ORAL
  Filled 2022-05-11 (×4): qty 10

## 2022-05-11 MED ORDER — PROPOFOL 1000 MG/100ML IV EMUL
INTRAVENOUS | Status: AC
  Filled 2022-05-11: qty 100

## 2022-05-11 MED ORDER — PROPOFOL 10 MG/ML IV BOLUS
INTRAVENOUS | Status: AC
  Filled 2022-05-11: qty 20

## 2022-05-11 MED ORDER — LIDOCAINE 2% (20 MG/ML) 5 ML SYRINGE
INTRAMUSCULAR | Status: DC | PRN
  Administered 2022-05-11: 100 mg via INTRAVENOUS

## 2022-05-11 NOTE — Interval H&P Note (Signed)
History and Physical Interval Note:  05/11/2022 1:18 PM  Gary Mitchell  has presented today for surgery, with the diagnosis of GI bleed.  The various methods of treatment have been discussed with the patient and family. After consideration of risks, benefits and other options for treatment, the patient has consented to  Procedure(s): ENTEROSCOPY (N/A) as a surgical intervention.  The patient's history has been reviewed, patient examined, no change in status, stable for surgery.  I have reviewed the patient's chart and labs.  Questions were answered to the patient's satisfaction.     [name]

## 2022-05-11 NOTE — Anesthesia Postprocedure Evaluation (Signed)
Anesthesia Post Note  Patient: Gary Mitchell  Procedure(s) Performed: ENTEROSCOPY HEMOSTASIS CLIP PLACEMENT     Patient location during evaluation: Endoscopy Anesthesia Type: MAC Level of consciousness: awake and alert Pain management: pain level controlled Vital Signs Assessment: post-procedure vital signs reviewed and stable Respiratory status: spontaneous breathing and respiratory function stable Cardiovascular status: stable Postop Assessment: no apparent nausea or vomiting Anesthetic complications: no   No notable events documented.  Last Vitals:  Vitals:   05/11/22 1400 05/11/22 1410  BP: 113/78 134/87  Pulse: 76 75  Resp: 13 12  Temp:    SpO2: 98% 100%    Last Pain:  Vitals:   05/11/22 1410  TempSrc:   PainSc: 0-No pain                 Ekaterini Capitano DANIEL

## 2022-05-11 NOTE — Anesthesia Preprocedure Evaluation (Signed)
Anesthesia Evaluation  Patient identified by MRN, date of birth, ID band Patient awake    Reviewed: Allergy & Precautions, H&P , NPO status , Patient's Chart, lab work & pertinent test results  History of Anesthesia Complications Negative for: history of anesthetic complications  Airway Mallampati: II  TM Distance: >3 FB Neck ROM: Full    Dental no notable dental hx. (+) Teeth Intact, Dental Advisory Given   Pulmonary neg pulmonary ROS, former smoker   Pulmonary exam normal breath sounds clear to auscultation       Cardiovascular negative cardio ROS  Rhythm:Regular Rate:Normal     Neuro/Psych Seizures -, Well Controlled,   negative psych ROS   GI/Hepatic ,GERD  Medicated,,(+) Cirrhosis       , Hepatitis -  Endo/Other  diabetes, Type 2, Oral Hypoglycemic Agents    Renal/GU Renal disease  negative genitourinary   Musculoskeletal   Abdominal   Peds  Hematology  (+) Blood dyscrasia, anemia   Anesthesia Other Findings   Reproductive/Obstetrics negative OB ROS                             Anesthesia Physical Anesthesia Plan  ASA: 3  Anesthesia Plan: MAC   Post-op Pain Management: Minimal or no pain anticipated   Induction: Intravenous  PONV Risk Score and Plan: 1 and Propofol infusion  Airway Management Planned: Natural Airway and Simple Face Mask  Additional Equipment:   Intra-op Plan:   Post-operative Plan:   Informed Consent: I have reviewed the patients History and Physical, chart, labs and discussed the procedure including the risks, benefits and alternatives for the proposed anesthesia with the patient or authorized representative who has indicated his/her understanding and acceptance.     Dental advisory given  Plan Discussed with: CRNA and Anesthesiologist  Anesthesia Plan Comments:         Anesthesia Quick Evaluation

## 2022-05-11 NOTE — Op Note (Signed)
[facility] Patient Name: Gary Mitchell Procedure Date: 05/11/2022 MRN: [id number] Attending MD: Gary Pih. Gary Mitchell , MD, [id number] Date of Birth: Sep 24, 1970 CSN: [id number] Age: 51 Admit Type: Inpatient Procedure:                Small bowel enteroscopy Indications:              GI bleeding source not documented by previous EGD                            and colonoscopy (EGD essentially normal, biopsies                            taken), colonoscopy next day with blood in colon                            and terminal ileum. VCE the following day with                            copious blood and clot in stomach and active                            bleeding in duodenum, no mass or ulcer seen.                            Patient initially presented with hgb 5 with iron                            deficiency and no reports of overt bleeding. Providers:                Gary Pih. Gary Schatz, MD, Benay Pillow, RN, [facility] Technician, Technician Referring MD:              Medicines:                Monitored Anesthesia Care Complications:            No immediate complications. Estimated Blood Loss:     Estimated blood loss was minimal. Procedure:                Pre-Anesthesia Assessment:                           - Prior to the procedure, a History and Physical                            was performed, and patient medications and                            allergies were reviewed. The patient's tolerance of                            previous anesthesia was also reviewed. The risks  and benefits of the procedure and the sedation                            options and risks were discussed with the patient.                            All questions were answered, and informed consent                            was obtained. Prior Anticoagulants: The patient has                            taken no anticoagulant or antiplatelet  agents. ASA                            Grade Assessment: III - A patient with severe                            systemic disease. After reviewing the risks and                            benefits, the patient was deemed in satisfactory                            condition to undergo the procedure.                           After obtaining informed consent, the endoscope was                            passed under direct vision. Throughout the                            procedure, the patient's blood pressure, pulse, and                            oxygen saturations were monitored continuously. The                            PCF-H190TL ([id number]) Olympus slim colonoscope was                            introduced through the mouth and advanced to the                            proximal jejunum. The small bowel enteroscopy was                            accomplished without difficulty. The patient                            tolerated the procedure well. Scope In: Scope Out: Findings:      Small (< 5 mm) varices were found in the lower third  of the esophagus.      The exam of the esophagus was otherwise normal.      Mild portal hypertensive gastropathy was found in the gastric body.      Five non-bleeding superficial gastric ulcers, 2 with with adherent clot       were found in the gastric body, at the incisura and in the gastric       antrum. The largest lesion was 6 mm in largest dimension.      Four non-bleeding superficial duodenal ulcers, three with large adherent       clots were found in the duodenal bulb and in the second portion of the       duodenum. The largest lesion was 8 mm in largest dimension. For       hemostasis, four hemostatic clips were successfully placed (MR       conditional). Clip manufacturer: [employer]. There was no       bleeding at the end of the procedure.      The exam of the duodenum was otherwise normal.      There was no evidence of significant  pathology in the proximal jejunum. Impression:               - Small (< 5 mm) esophageal varices.                           - Mild portal hypertensive gastropathy.                           - Non-bleeding gastric ulcers with adherent clot.                           - Non-bleeding duodenal ulcers with adherent clot.                            Clips (MR conditional) were placed. Clip                            manufacturer: [employer].                           - The examined portion of the jejunum was normal.                           - No specimens collected.                           - The ulcers in the stomach and duodenum were from                            the mucosal biopsies taken during his EGD Oct 31.                            Given the bleeding stigmata, I think these are the                            mostly likely cause of the active bleeding seen on  his colonoscopy, capsule endoscopy and his overt                            bleeding experienced after his EGD.                           - There is still not a clear explanation for his                            presenting anemia with hgb of 5 (and he denied                            overt bleeding previously). No AVMs were seen in                            the small intestine on enteroscopy today. Capsule                            endoscopy yesterday not adequate to exclude small                            AVMs given the large amount of blood throughout the                            intestine. Moderate Sedation:      Not Applicable - Patient had care per Anesthesia. Recommendation:           - Return patient to hospital ward for ongoing care.                           - Resume regular diet.                           - Use Protonix (pantoprazole) 40 mg PO BID for 8                            weeks.                           - Use sucralfate suspension 1 gram PO QID for 2                             weeks.                           - Check CBC in am.                           - If stable hgb tomorrow, consider d/c home                           - Patient's EUS is scheduled as an outpatient for                            Dec  7th with Dr. Rush Landmark. Procedure Code(s):        --- Professional ---                           [id number], Small intestinal endoscopy, enteroscopy                            beyond second portion of duodenum, not including                            ileum; with control of bleeding (eg, injection,                            bipolar cautery, unipolar cautery, laser, heater                            probe, stapler, plasma coagulator) Diagnosis Code(s):        --- Professional ---                           I85.00, Esophageal varices without bleeding                           K76.6, Portal hypertension                           K31.89, Other diseases of stomach and duodenum                           K25.4, Chronic or unspecified gastric ulcer with                            hemorrhage                           K26.4, Chronic or unspecified duodenal ulcer with                            hemorrhage                           K92.2, Gastrointestinal hemorrhage, unspecified CPT copyright 2022 American Medical Association. All rights reserved. The codes documented in this report are preliminary and upon coder review may  be revised to meet current compliance requirements. Gary Justin E. Gary Schatz, MD 05/11/2022 2:07:21 PM This report has been signed electronically. Number of Addenda: 0

## 2022-05-11 NOTE — Consult Note (Signed)
Palliative Care Consult Note                                  Date: 05/11/2022   Patient Name: Gary Mitchell  DOB: Dec 14, 1970  MRN: [id number]  Age / Sex: 51 y.o., male  PCP: Charlott Rakes, MD Referring Physician: Hosie Poisson, MD  Reason for Consultation: Establishing goals of care  HPI/Patient Profile: Palliative Care consult requested for goals of care discussion in this 51 y.o. male  with past medical history of GERD, alcoholic cirrhosis, seizures, thrombocytopenia, and pancreatic mass.  He was admitted on 05/05/2022 from home with hemoglobin 5.7 and fatigue.  Received 2 units PRBCs.  Positive for black tarry stools.  He has been followed by GI with planned enteroscopy on today.  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 [name] and his mother [name] to discuss diagnosis, prognosis, GOC, EOL wishes disposition and options.  Patient is awake and alert.  Watching TV.  Able to engage appropriately in discussions.  He does give permission to have discussions with his mother is present.   Concept of Palliative Care was introduced as specialized medical care for people and their families living with serious illness.  It focuses on providing relief from the symptoms and stress of a serious illness.  The goal is to improve quality of life  for both the patient and the family. Values and goals of care important to patient and family were attempted to be elicited.  Immediately after introduced myself patient states he does not know why I am here and he does not need any services as his goal is to get out of the hospital today or tomorrow.  States multiple times he does not like talking to different providers and he does not like being in the hospital.  He is respectful and agrees to at least have conversations with prompting from his mother.  I created space and opportunity for patient and family to explore state of health prior to admission, thoughts, and feelings.   [name] lives in the home with his mother.  He is single.  Has 2 children.  Has worked different jobs over the years.  At home he is independent of all ADLs.  States he walks to most of his locations or his mother drives.  He does endorse that he actively still drinks.  Mother states he will drink for several days (beer and liquor) and will then stop and not drink for weeks only to repeat the pattern.  We discussed His current illness and what it means in the larger context of His on-going co-morbidities. Natural disease trajectory and expectations were discussed.  Patient and his mother verbalized understanding of current illness and co-morbidities.  [name] again states he does not like being  in the hospital and is ready to go home.  States he has not been able to eat because of another procedure and that he is getting tired of all of this.  I attempted to get him to elaborate by his statement however he states "tired of people coming in and out and just want to be at home".  He shares that he is unable to eat today after his procedure he is going to eat anyway or will be leaving the hospital.  I emphasized the importance of remaining hospitalized to make sure his condition is stable as possible.  Education provided on the criticalness and possible life-threatening events  that could occur if he continues to bleed.  We also discussed his possible pancreatic mass.  He shrugs his shoulder stating "just something else for the doctors to try to find money for".  His mother states he is stubborn but he is grown and can make his own decisions.  I discussed the importance of continued conversation with family and their medical providers regarding overall plan of care and treatment options, ensuring decisions are within the context of the patients values and GOCs.  Questions and concerns were addressed.  Niels and his mother was encouraged to call with questions or concerns.  PMT will continue to support holistically as needed.   Objective:   Primary Diagnoses: Present on Admission:  Symptomatic anemia  Alcoholic cirrhosis of liver without ascites (HCC)  GERD (gastroesophageal reflux disease)  Hypokalemia  Esophageal varices without bleeding (HCC)  Pancreatic mass  Alcohol abuse  Hyperlipidemia  Hypomagnesemia   Scheduled Meds:  folic acid  1 mg Oral Daily   gabapentin  300 mg Oral QHS   insulin aspart  0-9 Units Subcutaneous Q4H   lactulose  20 g Oral TID   levETIRAcetam  500 mg Oral BID   multivitamin with minerals  1 tablet Oral Daily   pantoprazole (PROTONIX) IV  40 mg Intravenous Q12H   thiamine  100 mg Oral Daily   Or   thiamine  100 mg Intravenous Daily    Continuous Infusions:  sodium chloride Stopped (05/05/22 2327)   lactated ringers 10 mL/hr at 05/08/22 1312   magnesium sulfate bolus IVPB 4 g (05/11/22 0913)   sodium bicarbonate 150 mEq in sterile water 1,150 mL infusion 75 mL/hr at 05/10/22 2101    PRN Meds: hydrALAZINE, HYDROcodone-acetaminophen, labetalol, ondansetron (ZOFRAN) IV, mouth rinse  Allergies  Allergen Reactions   Iohexol Other (See Comments)    Unknown reaction at 63 days old Mom at bedside reported patient was given injections of iohexol in foot and resulted in "hole in foot"    Review of Systems  Constitutional:   Positive for fatigue.  Gastrointestinal:  Positive for blood in stool.  Neurological:  Positive for weakness.  Unless otherwise noted, a complete review of systems is negative.  Physical Exam General: NAD Cardiovascular: regular rate and rhythm Pulmonary: clear ant fields, diminished bilaterally  Abdomen: soft, nontender, + bowel sounds Extremities: no edema, no joint deformities Skin: no rashes, warm and dry Neurological: AAOx3, flat affect  Vital Signs:  BP 121/75 (BP Location: Left Arm)   Pulse 82   Temp 98.4 F (36.9 C) (Oral)   Resp 20   Ht _0  (1.626 m)   Wt 63 kg   SpO2 100%   BMI 23.84 kg/m  Pain Scale: 0-10   Pain Score: 0-No pain  SpO2: SpO2: 100 % O2 Device:SpO2: 100 % O2 Flow Rate: .O2 Flow  Rate (L/min): 6 L/min  IO: Intake/output summary:  Intake/Output Summary (Last 24 hours) at 05/11/2022 1034 Last data filed at 05/11/2022 0600 Gross per 24 hour  Intake 1458.12 ml  Output 400 ml  Net 1058.12 ml    LBM: Last BM Date : 05/10/22 Baseline Weight: Weight: 63 kg Most recent weight: Weight: 63 kg      Palliative Assessment/Data:    Advanced Care Planning:   Primary Decision Maker: PATIENT  Code Status/Advance Care Planning: Full code  A discussion was had today regarding advanced directives. Concepts specific to code status, artifical feeding and hydration, continued IV antibiotics and rehospitalization was had.   Mr. Munford confirms wishes for full code/full scope of aggressive medical interventions.  He does not have a completed advanced directive.  He does express in the event he is unable to make medical decisions for himself that his mother Kotaro Buer would be his medical decision-maker.  Mr. Krenn will most likely benefit for outpatient palliative support however he is not interested in any home services at this time.  He understands if he changes his mind he may speak with his medical team to have outpatient services  arranged.  Assessment & Plan:   SUMMARY OF RECOMMENDATIONS   Full Code-as confirmed by patient and his mother Continue with current plan of care.  Patient is clear and expressed wishes to continue to treat the treatable aggressively. PMT will continue to support and follow as needed.  Goals are clear and set.  Please secure chat with urgent needs.  Palliative will continue to follow peripherally.  Patient would not be seeing daily unless further needs arise.  Symptom Management:  Per Attending  Palliative Prophylaxis:  Bowel Regimen  Additional Recommendations (Limitations, Scope, Preferences): Full Scope Treatment  Psycho-social/Spiritual:  Desire for further Chaplaincy support: no Additional Recommendations:  Ongoing goals of care discussions  Prognosis:  Guarded  Discharge Planning:  To Be Determined    Patient and his mother expressed understanding and was in agreement with this plan.    Time Total: 55 min  Visit consisted of counseling and education dealing with the complex and emotionally intense issues of symptom management and palliative care in the setting of serious and potentially life-threatening illness.Greater than 50%  of this time was spent counseling and coordinating care related to the above assessment and plan.  Signed by:  Alda Lea, AGPCNP-BC Palliative Medicine Team  Phone: [phone] Pager: [phone] Amion: Bjorn Pippin   Thank you for allowing the Palliative Medicine Team to assist in the care of this patient. Please utilize secure chat with additional questions, if there is no response within 30 minutes please call the above phone number. Palliative Medicine Team providers are available by phone from 7am to 5pm daily and can be reached through the team cell phone.  Should this patient require assistance outside of these hours, please call the patient's attending physician.

## 2022-05-11 NOTE — Progress Notes (Signed)
[facility]                                                                               Gary Mitchell, is a 51 y.o. 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 He had outpatient lab work checked and was found to have critical low hemoglobin and was referred to the ER. On work-up he was found to have hemoglobin 5 g/dL . He underwent a total of 6 units of pRBC transfusion.  GI was also consulted on admission. Patient underwent EGD, Esophageal varices and mild portal gastropathy He also underwent colonoscopy showing blood in the terminal ileum. Blood in the entire examined colon and 8 mm polyp in th descending colon , removed with a cold snare. Capsule endoscopy done on 05/09/22 and results  reviewed. He then underwent enteroscopy on 05/11/22  results "Non-bleeding gastric and duodenal ulcers with adherent clot. The ulcers in the stomach and duodenum were from the mucosal biopsies taken during his EGD Oct 31".  During his hospitalization , he continues to have intermittent rectal bleeding, melanotic stools and hematemesis.  Recommend checking H&H in am. the patient seen and examined. No new complaints noted.      Assessment & Plan    Assessment and Plan:  Iron deficiency anemia/ Acute anemia of blood loss:  Hemoglobin at 5 on admission. S/P 4 units of prbc transfusion and hemoglobin around 7 with persistent hematemesis and rectal bleeding and melanotic stools.  GI consulted, underwent EGD shows grade I esophageal varices and mild portal gastropathy. Gastric bx pending to rule out H.pylori. Colonoscopy:  "Blood in the entire examined  colon. One 8 mm polyp in the descending colon, removed with a cold snare. Resected and retrieved. Clip (MR conditional) was placed. Blood in the terminal ileum, likely indicating bleeding from the small intestine. Intestinal AVMs could explain profound iron deficiency anemia as well as intermittent overt bleeding." Capsule endoscopy done,  shows " fresh blood with clot in the stomach, copious fresh blood in the duodenum and dark red blood throughout the small intestine and cecum". He then underwent enteroscopy   today to identify the bleeding source , was found to have non bleeding gastric and duodenal ulcers with adherent clots. The ulcers from the mucosal biopsies taken from EGD on 10/31. Meanwhile continue to monitor his hemoglobin. He is currently on a diet and able to tolerate without nausea, vomiting.  Transfuse to keep hemoglobin greater than 7, check H&H every 12 hours.     Elevated total Bilirubin:  Possibly from cirrhosis, .  Patient had elevated bilirubin in the past as high as 28. Bilirubin fluctuating this admission, as high as 11. Currently stabilized at 7.7.  INR is 1.6, LDH is wnl, haptoglobin is pending. DIC panel shows normal d dimer, fibrinogen slightly low, and schistocytes  present. Curbside with Dr Benay Spice, suggested elevated levels probably from cirrhosis, and less likely hemolysis. Peripheral smear ordered.  CT abd and pelvis with pancreas protocol done showed suspicious pancreatic lesion with nodular component with abrupt ductal transition. Plan for repeat endoscopic [place] to resample the area in the future.      Thrombocytopenia: from cirrhosis.  Worsening thrombocytopenia from 111 to 87 to 91 to 83 to 63 to 46   Portal hypertension Esophageal varices History of hepatic encephalopathy Cirrhosis - non compliance with lactulose  - s/p recent EUS July 2023 with varices noted. - target atleast 2 bm daily. Patient non compliant to lactulose.     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 Showing suspicious pancreatic lesion with nodular component with abrupt ductal transition. Plan for repeat endoscopic [place] to resample the area.  EUS ON 7/23  shows thickening, no masses.  Repeat EUS this Monday with possible brushings if he is inpatient.  .    Type 2 DM:  CBG (last 3)  Recent Labs    05/11/22 0732 05/11/22 1152 05/11/22 1506  GLUCAP 113* 148* 123*    Resume SSI. No change in meds.    Hypertension:  Well controlled.    Hypokalemia: replaced.    Seizures;  On keppra.    GERD ON PPI.    Hypomagnesemia:  Replaced. Recheck levels in am.    Hyperkalemia:  Unclear etiology.  Lokelma given and repeat levels this evening wnl.      RN Pressure Injury Documentation:    Malnutrition Type:  Nutrition Problem: Moderate Malnutrition Etiology: chronic illness (alcoholic cirrhosis)   Malnutrition Characteristics:  Signs/Symptoms: energy intake < or equal to 75% for > or equal to 1 month, mild fat depletion, mild muscle depletion   Nutrition Interventions:  Interventions: Other (Comment) (Ensure Max)  Estimated body mass index is 23.84 kg/m as calculated from the following:   Height as of this encounter: '5\' 4"'$  (1.626 m).   Weight as of this encounter: 63 kg.  Code Status: full code.  DVT Prophylaxis:  SCDs Start: 05/06/22 0308   Level of Care: Level of care: Progressive Family Communication: none at bedside.   Disposition Plan:     Remains inpatient appropriate:  colonoscopy,  Procedures:  Egd colonoscopy  Consultants:   GI  Antimicrobials:   Anti-infectives (From admission, onward)    None        Medications  Scheduled Meds:  folic acid  1 mg Oral Daily   gabapentin  300 mg Oral QHS   insulin aspart  0-9 Units Subcutaneous Q4H   lactulose  20 g Oral TID   levETIRAcetam  500 mg Oral BID   multivitamin with minerals  1 tablet Oral Daily   pantoprazole (PROTONIX)  IV  40 mg Intravenous Q12H   sucralfate  1 g Oral TID WC & HS   thiamine  100 mg Oral Daily   Or   thiamine  100 mg Intravenous Daily   Continuous Infusions:  sodium chloride Stopped (05/05/22 2327)   lactated ringers 10 mL/hr at 05/11/22 1320   sodium bicarbonate 150 mEq in sterile water 1,150 mL infusion 75 mL/hr at 05/11/22 1035   PRN Meds:.hydrALAZINE, HYDROcodone-acetaminophen, labetalol, ondansetron (ZOFRAN) IV, mouth rinse    Subjective:   Gary Mitchell was seen and examined today.  No new complaints this morning.  Objective:   Vitals:   05/11/22 1400 05/11/22 1410 05/11/22 1420 05/11/22 1438  BP: 113/78 134/87 133/88 Marland Kitchen)  139/95  Pulse: 76 75 75 74  Resp: '13 12 14 14  '$ Temp:    97.6 F (36.4 C)  TempSrc:    Oral  SpO2: 98% 100% 99% 100%  Weight:      Height:        Intake/Output Summary (Last 24 hours) at 05/11/2022 1745 Last data filed at 05/11/2022 1745 Gross per 24 hour  Intake 2251.91 ml  Output 200 ml  Net 2051.91 ml    Filed Weights   05/06/22 0409 05/07/22 1313 05/09/22 0815  Weight: 63 kg 63 kg 63 kg     Exam General exam: Appears calm and comfortable  Respiratory system: Clear to auscultation. Respiratory effort normal. Cardiovascular system: S1 & S2 heard, RRR. No JVD,  No pedal edema. Gastrointestinal system: Abdomen is nondistended, soft and nontender.  Normal bowel sounds heard. Central nervous system: Alert and oriented. No focal neurological deficits. Extremities: Symmetric 5 x 5 power. Skin: No rashes, lesions or ulcers Psychiatry: Mood & affect appropriate.        Data Reviewed:  I have personally reviewed following labs and imaging studies   CBC Lab Results  Component Value Date   WBC 6.6 05/11/2022   RBC 3.26 (L) 05/11/2022   HGB 8.5 (L) 05/11/2022   HCT 26.1 (L) 05/11/2022   MCV 80.1 05/11/2022   MCH 26.1 05/11/2022   PLT 46 (L) 05/11/2022   MCHC 32.6 05/11/2022   RDW 28.1 (H) 05/11/2022   LYMPHSABS 1.8 05/11/2022    MONOABS 1.5 (H) 05/11/2022   EOSABS 0.1 05/11/2022   BASOSABS 0.1 27/25/3664     Last metabolic panel Lab Results  Component Value Date   NA 131 (L) 05/11/2022   K 3.5 05/11/2022   CL 95 (L) 05/11/2022   CO2 27 05/11/2022   BUN 9 05/11/2022   CREATININE 0.49 (L) 05/11/2022   GLUCOSE 110 (H) 05/11/2022   GFRNONAA >60 05/11/2022   GFRAA 147 01/12/2020   CALCIUM 8.6 (L) 05/11/2022   PHOS 3.1 05/06/2022   PROT 6.2 (L) 05/11/2022   ALBUMIN 2.7 (L) 05/11/2022   LABGLOB 4.6 (H) 08/22/2021   AGRATIO 0.8 (L) 08/22/2021   BILITOT 7.7 (H) 05/11/2022   ALKPHOS 80 05/11/2022   AST 37 05/11/2022   ALT 14 05/11/2022   ANIONGAP 9 05/11/2022    CBG (last 3)  Recent Labs    05/11/22 0732 05/11/22 1152 05/11/22 1506  GLUCAP 113* 148* 123*       Coagulation Profile: Recent Labs  Lab 05/05/22 2359 05/06/22 0452 05/09/22 1540  INR 1.4* 1.5* 1.6*      Radiology Studies: No results found.     Hosie Poisson M.D. [facility] 05/11/2022, 5:45 PM  Available via Epic secure chat 7am-7pm After 7 pm, please refer to night coverage provider listed on amion.

## 2022-05-11 NOTE — Transfer of Care (Signed)
Immediate Anesthesia Transfer of Care Note  Patient: Gary Mitchell  Procedure(s) Performed: ENTEROSCOPY HEMOSTASIS CLIP PLACEMENT  Patient Location: PACU  Anesthesia Type:MAC  Level of Consciousness: sedated  Airway & Oxygen Therapy: Patient Spontanous Breathing and Patient connected to face mask oxygen  Post-op Assessment: Report given to RN and Post -op Vital signs reviewed and stable  Post vital signs: Reviewed and stable  Last Vitals:  Vitals Value Taken Time  BP 112/65 05/11/22 1348  Temp    Pulse 82 05/11/22 1349  Resp 16 05/11/22 1349  SpO2 100 % 05/11/22 1349  Vitals shown include unvalidated device data.  Last Pain:  Vitals:   05/11/22 1310  TempSrc:   PainSc: 0-No pain         Complications: No notable events documented.

## 2022-05-12 ENCOUNTER — Encounter: Payer: Self-pay | Admitting: Gastroenterology

## 2022-05-12 ENCOUNTER — Encounter (HOSPITAL_COMMUNITY): Payer: Self-pay | Admitting: Gastroenterology

## 2022-05-12 DIAGNOSIS — E7220 Disorder of urea cycle metabolism, unspecified: Secondary | ICD-10-CM

## 2022-05-12 DIAGNOSIS — E440 Moderate protein-calorie malnutrition: Secondary | ICD-10-CM

## 2022-05-12 LAB — CBC WITH DIFFERENTIAL/PLATELET
Abs Immature Granulocytes: 0.02 10*3/uL (ref 0.00–0.07)
Basophils Absolute: 0.1 10*3/uL (ref 0.0–0.1)
Basophils Relative: 1 %
Eosinophils Absolute: 0.1 10*3/uL (ref 0.0–0.5)
Eosinophils Relative: 1 %
HCT: 27.2 % — ABNORMAL LOW (ref 39.0–52.0)
Hemoglobin: 8.9 g/dL — ABNORMAL LOW (ref 13.0–17.0)
Immature Granulocytes: 0 %
Lymphocytes Relative: 26 %
Lymphs Abs: 1.6 10*3/uL (ref 0.7–4.0)
MCH: 26.6 pg (ref 26.0–34.0)
MCHC: 32.7 g/dL (ref 30.0–36.0)
MCV: 81.2 fL (ref 80.0–100.0)
Monocytes Absolute: 1.3 10*3/uL — ABNORMAL HIGH (ref 0.1–1.0)
Monocytes Relative: 21 %
Neutro Abs: 3.1 10*3/uL (ref 1.7–7.7)
Neutrophils Relative %: 51 %
Platelets: 31 10*3/uL — ABNORMAL LOW (ref 150–400)
RBC: 3.35 MIL/uL — ABNORMAL LOW (ref 4.22–5.81)
RDW: 30.2 % — ABNORMAL HIGH (ref 11.5–15.5)
WBC: 6.1 10*3/uL (ref 4.0–10.5)
nRBC: 0 % (ref 0.0–0.2)

## 2022-05-12 LAB — COMPREHENSIVE METABOLIC PANEL
ALT: 17 U/L (ref 0–44)
AST: 37 U/L (ref 15–41)
Albumin: 2.9 g/dL — ABNORMAL LOW (ref 3.5–5.0)
Alkaline Phosphatase: 85 U/L (ref 38–126)
Anion gap: 9 (ref 5–15)
BUN: 8 mg/dL (ref 6–20)
CO2: 29 mmol/L (ref 22–32)
Calcium: 8.6 mg/dL — ABNORMAL LOW (ref 8.9–10.3)
Chloride: 92 mmol/L — ABNORMAL LOW (ref 98–111)
Creatinine, Ser: 0.5 mg/dL — ABNORMAL LOW (ref 0.61–1.24)
GFR, Estimated: >60 mL/min (ref >60)
Glucose, Bld: 229 mg/dL — ABNORMAL HIGH (ref 70–99)
Potassium: 3.8 mmol/L (ref 3.5–5.1)
Sodium: 130 mmol/L — ABNORMAL LOW (ref 135–145)
Total Bilirubin: 10 mg/dL — ABNORMAL HIGH (ref 0.3–1.2)
Total Protein: 6.5 g/dL (ref 6.5–8.1)

## 2022-05-12 LAB — GLUCOSE, CAPILLARY
Glucose-Capillary: 185 mg/dL — ABNORMAL HIGH (ref 70–99)
Glucose-Capillary: 213 mg/dL — ABNORMAL HIGH (ref 70–99)
Glucose-Capillary: 93 mg/dL (ref 70–99)
Glucose-Capillary: 95 mg/dL (ref 70–99)

## 2022-05-12 MED ORDER — VITAMIN B-1 100 MG PO TABS
100.0000 mg | ORAL_TABLET | Freq: Every day | ORAL | 2 refills | Status: DC
  Filled 2022-05-12: qty 30, 30d supply, fill #0

## 2022-05-12 MED ORDER — FERROUS SULFATE 325 (65 FE) MG PO TABS: 325.0000 mg | ORAL_TABLET | Freq: Two times a day (BID) | ORAL | 11 refills | Status: DC

## 2022-05-12 MED ORDER — SUCRALFATE 1 GM/10ML PO SUSP
1.0000 g | Freq: Three times a day (TID) | ORAL | 0 refills | Status: DC
  Filled 2022-05-12: qty 420, 11d supply, fill #0

## 2022-05-12 MED ORDER — LACTULOSE 10 GM/15ML PO SOLN
20.0000 g | Freq: Three times a day (TID) | ORAL | 5 refills | Status: DC
  Filled 2022-05-12: qty 270, 3d supply, fill #0
  Filled 2022-07-31: qty 473, 5d supply, fill #1
  Filled 2022-09-19: qty 473, 5d supply, fill #2

## 2022-05-12 MED ORDER — FOLIC ACID 1 MG PO TABS
1.0000 mg | ORAL_TABLET | Freq: Every day | ORAL | 2 refills | Status: DC
  Filled 2022-05-12: qty 30, 30d supply, fill #0
  Filled 2022-07-10: qty 30, 30d supply, fill #1
  Filled 2022-09-19 (×2): qty 30, 30d supply, fill #2

## 2022-05-12 MED ORDER — PANTOPRAZOLE SODIUM 40 MG PO TBEC
40.0000 mg | DELAYED_RELEASE_TABLET | Freq: Two times a day (BID) | ORAL | 3 refills | Status: DC
  Filled 2022-05-12 – 2022-07-10 (×2): qty 30, 15d supply, fill #0
  Filled 2022-09-19: qty 30, 15d supply, fill #1
  Filled 2022-11-13: qty 30, 15d supply, fill #2
  Filled 2022-11-26: qty 30, 15d supply, fill #3

## 2022-05-12 NOTE — Progress Notes (Signed)
Patient discharged home.  Discharge instructions explained, patient verbalizes understanding 

## 2022-05-12 NOTE — Discharge Summary (Signed)
Physician Discharge Summary   Patient: Gary Mitchell MRN: [id number] DOB: [birth year]  Admit date:     05/05/2022  Discharge date: {dischdate:26783}  Discharge Physician: Hosie Poisson   PCP: Charlott Rakes, MD   Recommendations at discharge:  {Tip this will not be part of the note when signed- Example include specific recommendations for outpatient follow-up, pending tests to follow-up on. (Optional):26781}  ***  Discharge Diagnoses: Principal Problem:   Symptomatic anemia Active Problems:   Alcohol abuse   Pancreatic mass   GERD (gastroesophageal reflux disease)   Seizure disorder (HCC)   Alcoholic cirrhosis of liver without ascites (HCC)   Hypomagnesemia   Hypokalemia   Portal hypertensive gastropathy (HCC)   Esophageal varices without bleeding (HCC)   Type 2 diabetes mellitus (HCC)   Hyperlipidemia   Malnutrition of moderate degree   Gastrointestinal hemorrhage   Benign neoplasm of descending colon  Resolved Problems:   * No resolved hospital problems. [facility] Course: 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 He had outpatient lab work checked and was found to have critical low hemoglobin and was referred to the ER. On work-up he was found to have hemoglobin 5 g/dL and was ordered 2 units PRBC. GI was also consulted on admission.  Assessment and Plan: No notes have been filed under this hospital service. Service: Hospitalist     {Tip this will not be part of the note when signed Body mass index is 23.84 kg/m. ,  Nutrition Documentation    Flowsheet Row ED to Hosp-Admission (Current) from 05/05/2022 in [place]  Nutrition Problem Moderate Malnutrition  Etiology chronic illness  [alcoholic cirrhosis]  Nutrition Goal Patient will meet  greater than or equal to 90% of their needs  Interventions Other (Comment)  [Ensure Max]     ,  (Optional):26781}  {(NOTE) Pain control PDMP Statment (Optional):26782} Consultants: *** Procedures performed: ***  Disposition: {Plan; Disposition:26390} Diet recommendation:  Discharge Diet Orders (From admission, onward)     Start     Ordered   05/12/22 0000  Diet - low sodium heart healthy        05/12/22 1449           {Diet_Plan:26776} DISCHARGE MEDICATION: Allergies as of 05/12/2022       Reactions   Iohexol Other (See Comments)   Unknown reaction at 4 days old Mom at bedside reported patient was given injections of iohexol in foot and resulted in "hole in foot"        Medication List     STOP taking these medications    ciprofloxacin 500 MG tablet Commonly known as: CIPRO   potassium chloride SA 20 MEQ tablet Commonly known as: KLOR-CON M   predniSONE 50 MG tablet Commonly known as: DELTASONE       TAKE these medications    atorvastatin 20 MG tablet Commonly known as: LIPITOR Take 1 tablet (20 mg total) by mouth daily.   ferrous sulfate 325 (65 FE) MG tablet Take 1 tablet (325 mg total) by mouth 2 (two) times daily with a meal.   folic acid 1 MG tablet Commonly known as: FOLVITE Take 1 tablet (1 mg total) by mouth daily. Start taking on: May 13, 2022   gabapentin 300 MG capsule Commonly known as: NEURONTIN Take 1 capsule (  300 mg total) by mouth at bedtime.   lactulose 10 GM/15ML solution Commonly known as: CHRONULAC Take 30 mLs (20 g total) by mouth 3 (three) times daily. What changed: when to take this   levETIRAcetam 500 MG tablet Commonly known as: Keppra Take 1 tablet (500 mg total) by mouth 2 (two) times daily.   metFORMIN 500 MG tablet Commonly known as: GLUCOPHAGE Take 2 tablets (1,000 mg total) by mouth 2 (two) times daily with a meal.   multivitamin tablet Take 1 tablet by mouth at bedtime.   naltrexone 50 MG  tablet Commonly known as: DEPADE Take 1 tablet (50 mg total) by mouth daily.   pantoprazole 40 MG tablet Commonly known as: PROTONIX Take 1 tablet (40 mg total) by mouth 2 (two) times daily. What changed: when to take this   Potassium 99 MG Tabs Take 1 tablet by mouth daily as needed (cramps).   sucralfate 1 GM/10ML suspension Commonly known as: CARAFATE Take 10 mLs (1 g total) by mouth 4 (four) times daily -  with meals and at bedtime.   thiamine 100 MG tablet Commonly known as: Vitamin B-1 Take 1 tablet (100 mg total) by mouth daily. Start taking on: May 13, 2022        Follow-up Information     Charlott Rakes, MD. Schedule an appointment as soon as possible for a visit in 1 week(s).   Specialty: Family Medicine Why: for cbc and BMP, to check platelet count and hemoglobin. Contact information: [address] [place] [address] [phone]                Discharge Exam: Danley Danker Weights   05/06/22 0409 05/07/22 1313 05/09/22 0815  Weight: 63 kg 63 kg 63 kg   ***  Condition at discharge: {DC Condition:26389}  The results of significant diagnostics from this hospitalization (including imaging, microbiology, ancillary and laboratory) are listed below for reference.   Imaging Studies: CT ABDOMEN PELVIS W CONTRAST  Result Date: 05/09/2022 CLINICAL DATA:  51 year old male presents with pancreatic lesion. EXAM: CT ABDOMEN AND PELVIS WITH CONTRAST TECHNIQUE: Multidetector CT imaging of the abdomen and pelvis was performed using the standard protocol following bolus administration of intravenous contrast. RADIATION DOSE REDUCTION: This exam was performed according to the departmental dose-optimization program which includes automated exposure control, adjustment of the mA and/or kV according to patient size and/or use of iterative reconstruction technique. CONTRAST:  146m OMNIPAQUE IOHEXOL 300 MG/ML  SOLN COMPARISON:  Nov 14, 2021 MRI of the  abdomen. FINDINGS: Lower chest: Lung bases are clear. No effusion.  No consolidation or chest wall abnormality. Hepatobiliary: Cirrhotic hepatic morphology. No focal, suspicious hepatic lesion. Portal vein is patent. Recanalized umbilical vein with large body wall collaterals extending from the umbilicus LEFT inferior epigastric vein. Pancreas: Cystic lesion in the junction of body-tail of the pancreas measuring approximate 3.8 x 3.3 cm is within 1 mm of previous measurement and is associated as on the prior study with marked dilation of the distal pancreatic duct up to 8 mm with associated parenchymal atrophy of the peripheral pancreas. Nodular area at at the margin of the cystic lesion, not with in the cystic lesion but within pancreatic parenchyma (image 35/2) 8 x 9 mm. Mildly heterogeneous area on the arterial phase adjacent to SMV (image 43/2) this measures 15 x 7 mm and enhances to a greater extent than does the remainder of the atrophied pancreas. There is mild loss of normal fatty tissue planes in  the area surrounding the pancreas this is unchanged. Mild irregularity of the gastroduodenal artery is also noted. No adenopathy in the area about the pancreas. Mild fatty atrophy in the area of the pancreatic head and uncinate. Spleen: Unremarkable. Adrenals/Urinary Tract: Adrenal glands are normal. Absent LEFT kidney. Hypertrophied RIGHT kidney. No focal, suspicious renal lesion. Stomach/Bowel: No stranding adjacent to colon or small bowel. Appendix is normal. Vascular/Lymphatic: Patent portal vein and SMV. Patent splenic vein. Aortic atherosclerosis without aneurysm. No adenopathy in the abdomen in the pelvis. Varices about the anterior abdominal wall similar to previous imaging. Reproductive: Unremarkable by CT. Other: No ascites. Musculoskeletal: No acute bone finding. No destructive bone process. Spinal degenerative changes. IMPRESSION: 1. Cystic lesion in the junction of the body-tail of the pancreas is  within 1 mm of previous measurement and is associated with marked dilation of the distal pancreatic duct up to 8 mm and associated parenchymal atrophy of the peripheral pancreas. Findings may reflect pseudocyst from prior pancreatitis or cystic pancreatic neoplasm. Based on ductal transition in the mid pancreas the possibility of underlying occult neoplasm in addition to stricture is considered particularly at the site of ductal transition where there is variable pancreatic enhancement which is vaguely nodular (image 35/2) this is on the more central aspect of the cystic lesion closer to the portal-splenic confluence. 2. Ultimately endoscopic ultrasound may be helpful to assess the margins, not within the cystic lesion but at the site of ductal transition in the pancreas to exclude small indolent occult neoplasm which may be leading to ductal obstruction. 3. Other area of variable enhancement in the head of the pancreas adjacent SMV could also be assessed at this time though this is favored to represent bowel vary between more "normal" pancreas and areas of fatty atrophy in the head uncinate process. This could also be assessed with endoscopic veins. 4. Loss of normal tissue planes about the pancreas without substantial change. The likely related to prior pancreatitis. 5. Signs of hepatic cirrhosis with marked portal hypertension and large body wall collaterals. Aortic Atherosclerosis (ICD10-I70.0). Electronically Signed   By: Zetta Bills M.D.   On: 05/09/2022 08:48   DG Chest 2 View  Result Date: 05/06/2022 CLINICAL DATA:  Shortness of breath for 2-3 weeks. EXAM: CHEST - 2 VIEW COMPARISON:  Chest radiograph dated 05/06/2021. FINDINGS: The heart size and mediastinal contours are within normal limits. Both lungs are clear. Multiple anterior wedge deformities of the midthoracic spine appear unchanged from prior exam with up to 25-50% height loss anteriorly. IMPRESSION: No active cardiopulmonary disease.  Electronically Signed   By: Zerita Boers M.D.   On: 05/06/2022 08:37    Microbiology: Results for orders placed or performed during the hospital encounter of 07/08/20  Resp Panel by RT-PCR (Flu A&B, Covid) Nasopharyngeal Swab     Status: Abnormal   Collection Time: 07/09/20  2:06 AM   Specimen: Nasopharyngeal Swab; Nasopharyngeal(NP) swabs in vial transport medium  Result Value Ref Range Status   SARS Coronavirus 2 by RT PCR POSITIVE (A) NEGATIVE Final    Comment: CRITICAL RESULT CALLED TO, READ BACK BY AND VERIFIED WITH: Burnis Medin RN 07/09/2020 '@0443'$  BY P.HENDERSON (NOTE) SARS-CoV-2 target nucleic acids are DETECTED.  The SARS-CoV-2 RNA is generally detectable in upper respiratory specimens during the acute phase of infection. Positive results are indicative of the presence of the identified virus, but do not rule out bacterial infection or co-infection with other pathogens not detected by the test. Clinical correlation with patient history and  other diagnostic information is necessary to determine patient infection status. The expected result is Negative.  Fact Sheet for Patients: EntrepreneurPulse.com.au  Fact Sheet for Healthcare Providers: IncredibleEmployment.be  This test is not yet approved or cleared by the [place] FDA and  has been authorized for detection and/or diagnosis of SARS-CoV-2 by FDA under an Emergency Use Authorization (EUA).  This EUA will remain in effect ( meaning this test can be used) for the duration of  the COVID-19 declaration under Section 564(b)(1) of the Act, 21 U.S.C. section 360bbb-3(b)(1), unless the authorization is terminated or revoked sooner.     Influenza A by PCR NEGATIVE NEGATIVE Final   Influenza B by PCR NEGATIVE NEGATIVE Final    Comment: (NOTE) The Xpert Xpress SARS-CoV-2/FLU/RSV plus assay is intended as an aid in the diagnosis of influenza from Nasopharyngeal swab specimens  and should not be used as a sole basis for treatment. Nasal washings and aspirates are unacceptable for Xpert Xpress SARS-CoV-2/FLU/RSV testing.  Fact Sheet for Patients: EntrepreneurPulse.com.au  Fact Sheet for Healthcare Providers: IncredibleEmployment.be  This test is not yet approved or cleared by the [place] FDA and has been authorized for detection and/or diagnosis of SARS-CoV-2 by FDA under an Emergency Use Authorization (EUA). This EUA will remain in effect (meaning this test can be used) for the duration of the COVID-19 declaration under Section 564(b)(1) of the Act, 21 U.S.C. section 360bbb-3(b)(1), unless the authorization is terminated or revoked.  Performed at [facility], [place] [address], [place], [place] [address]     Labs: CBC: Recent Labs  Lab 05/09/22 0524 05/09/22 1220 05/09/22 1540 05/09/22 1800 05/10/22 0258 05/10/22 1336 05/10/22 2131 05/11/22 0557 05/12/22 0942  WBC 4.3 5.7  --   --  9.1  --   --  6.6 6.1  NEUTROABS 3.6 4.7  --   --  5.1  --   --  3.1 3.1  HGB 7.6* 7.8*  --    < > 5.9* 9.3* 9.3* 8.5* 8.9*  HCT 24.1* 25.7*  --    < > 18.6* 28.2* 28.4* 26.1* 27.2*  MCV 74.4* 77.4*  --   --  75.3*  --   --  80.1 81.2  PLT 87* 91* 83*  --  63*  --   --  46* 31*   < > = values in this interval not displayed.   Basic Metabolic Panel: Recent Labs  Lab 05/05/22 2359 05/06/22 0452 05/07/22 0501 05/08/22 0447 05/09/22 0524 05/09/22 1220 05/09/22 1800 05/10/22 0258 05/11/22 0557 05/12/22 0942  NA  --  138 134* 133* 126* 126*  --  130* 131* 130*  K  --  3.2* 3.6 3.6 5.7* 6.2* 4.4 3.1* 3.5 3.8  CL  --  108 103 104 102 100  --  98 95* 92*  CO2  --  21* 22 19* 14* 15*  --  '22 27 29  '$ GLUCOSE  --  98 127* 139* 173* 206*  --  163* 110* 229*  BUN  --  7 5* '7 9 10  '$ --  '15 9 8  '$ CREATININE  --  0.38* 0.42* 0.39* 0.51* 0.55*  --  0.48* 0.49* 0.50*  CALCIUM  --  7.7* 8.2* 8.5* 8.4* 8.5*  --   8.1* 8.6* 8.6*  MG 1.4* 1.7 1.5* 1.6* 1.6*  --   --  2.0 1.4*  --   PHOS 2.9 3.1  --   --   --   --   --   --   --   --  Liver Function Tests: Recent Labs  Lab 05/08/22 0447 05/09/22 0524 05/10/22 0258 05/11/22 0557 05/12/22 0942  AST 37 28 27 37 37  ALT '13 12 10 14 17  '$ ALKPHOS 96 87 79 80 85  BILITOT 11.5* 11.3* 7.0* 7.7* 10.0*  PROT 7.5 7.0 6.1* 6.2* 6.5  ALBUMIN 3.2* 2.9* 2.6* 2.7* 2.9*   CBG: Recent Labs  Lab 05/11/22 2014 05/12/22 0029 05/12/22 0419 05/12/22 0716 05/12/22 1139  GLUCAP 216* 93 185* 95 213*    Discharge time spent: {LESS THAN/GREATER THAN:26388} 30 minutes.  Signed: Hosie Poisson, MD [facility] 05/12/2022

## 2022-05-12 NOTE — Progress Notes (Signed)
[facility] ROUNDING NOTE   Subjective: No complaints today.  Patient had 2 bowel movements yesterday evening and 1 this morning which were initially dark, but subsequently brown.  He is hoping to go home today.   Objective: Vital signs in last 24 hours: Temp:  [97.6 F (36.4 C)-98.5 F (36.9 C)] 98.5 F (36.9 C) (11/05 0421) Pulse Rate:  [74-86] 84 (11/05 0421) Resp:  [12-18] 16 (11/05 0948) BP: (96-139)/(65-95) 129/78 (11/05 0421) SpO2:  [96 %-100 %] 96 % (11/05 0421) Last BM Date : 05/12/22 General: NAD Lungs:  CTA b/l, no w/r/r Heart:  RRR, no m/r/g Abdomen:  Soft, NT, ND, +BS Ext:  No c/c/e    Intake/Output from previous day: 11/04 0701 - 11/05 0700 In: 1997.4 [P.O.:510; I.V.:1437.4; IV Piggyback:50] Out: 5366 [Urine:1220] Intake/Output this shift: Total I/O In: -  Out: 200 [Urine:200]   Lab Results: Recent Labs    05/10/22 0258 05/10/22 1336 05/10/22 2131 05/11/22 0557 05/12/22 0942  WBC 9.1  --   --  6.6 6.1  HGB 5.9*   < > 9.3* 8.5* 8.9*  PLT 63*  --   --  46* 31*  MCV 75.3*  --   --  80.1 81.2   < > = values in this interval not displayed.   BMET Recent Labs    05/10/22 0258 05/11/22 0557 05/12/22 0942  NA 130* 131* 130*  K 3.1* 3.5 3.8  CL 98 95* 92*  CO2 '22 27 29  '$ GLUCOSE 163* 110* 229*  BUN '15 9 8  '$ CREATININE 0.48* 0.49* 0.50*  CALCIUM 8.1* 8.6* 8.6*   LFT Recent Labs    05/10/22 0258 05/11/22 0557 05/12/22 0942  PROT 6.1* 6.2* 6.5  ALBUMIN 2.6* 2.7* 2.9*  AST 27 37 37  ALT '10 14 17  '$ ALKPHOS 79 80 85  BILITOT 7.0* 7.7* 10.0*   PT/INR Recent Labs    05/09/22 1540  INR 1.6*      Imaging/Other results: No results found.    Assessment and Plan:  51 year old male with alcoholic cirrhosis with history of hepatic encephalopathy and small nonbleeding varices, no ascites and suspicious pancreatic lesion admitted with symptomatic anemia with hemoglobin 5, initially without overt bleeding.  EGD October 31  unremarkable, with very mild portal hypertensive gastropathy and small esophageal varices.  Colonoscopy November 1 with blood and clot throughout the colon, as well as in the terminal ileum, suggestive of bleeding source within the small intestine.  Capsule endoscopy November 2 with copious blood and clot in the stomach and proximal duodenum without ulcer or mass lesion.  Push enteroscopy November 4 with numerous ulcers with adherent clot in the stomach and duodenum, corresponding to biopsy sites from EGD on October 31. The patient's overt GI bleeding on November 1 through 3 can be attributed to bleeding from the biopsy sites taken during his EGD.  A conclusive source for his iron deficiency anemia that he presented with has not been identified.    He had a significant rise in his total bilirubin levels since admission (3 to 11), with otherwise stability of his liver enzymes and no biliary obstruction noted on repeat CT scan.  Fractionated bilirubin showed equal distribution of direct and indirect bilirubin levels.  He was noted to be hyperkalemic 11/2.  LDH and haptoglobin normal.  Bilirubin improved from 11 to 7 from November 2 to November 3, then increased again to 10 this morning.  Exact etiology for his hyperbilirubinemia unclear, but suspect combination of cirrhosis and  hemolysis.     He has a suspicious pancreatic lesion with nodular component and abrupt ductal transition and mildly elevated CA 19-9.  He is scheduled for a repeat endoscopic ultrasound with Dr.Mansouraty on December 7.   Iron deficiency anemia/GI bleeding - No definitive source for patient's chronic iron deficiency anemia identified -Overt bleeding seen on colonoscopy and capsule endoscopy attributed to bleeding from biopsy sites, which were treated with Hemoclips during enteroscopy on November 4 - We will repeat CBC in 2 weeks as outpatient - Recommend oral iron supplementation at discharge   Cirrhosis, no ascites, no history of  variceal bleeding, history of Hg - Persistent hyperammonemia, without clinical encephalopathy - Continue lactulose as needed titrating to 3 bowel movements daily - Patient I discussed the necessity for complete cessation of alcohol in order to avoid rapid deterioration of his liver function   Hyperbilirubinemia - Suspect multifactorial from cirrhosis and hemolysis from transfusions - Other liver enzymes and INR essentially stable - No evidence of biliary obstruction on CT - Continue to monitor  Suspicious pancreatic lesion - Patient is scheduled for endoscopic ultrasound with Dr. Rush Landmark December 7th.  Given stable CBC and absence of overt bleeding, okay to discharge home from GI standpoint.  We will arrange for outpatient follow-up    [name], MD  05/12/2022, 12:08 PM [facility]

## 2022-05-13 ENCOUNTER — Other Ambulatory Visit (HOSPITAL_COMMUNITY): Payer: Self-pay

## 2022-05-13 ENCOUNTER — Other Ambulatory Visit: Payer: Self-pay

## 2022-05-13 ENCOUNTER — Telehealth: Payer: Self-pay

## 2022-05-13 NOTE — Telephone Encounter (Signed)
Transition Care Management Unsuccessful Follow-up Telephone Call  Date of discharge and from where:  [facility] 114/2023  Attempts:  1st Attempt  Reason for unsuccessful TCM follow-up call:  Left voice message Juanda Crumble, [place] Direct Dial [phone]

## 2022-05-13 NOTE — Telephone Encounter (Signed)
EUS scheduled, pt instructed and medications reviewed.  Patient instructions mailed to home.  Patient to call with any questions or concerns.  

## 2022-05-14 NOTE — Telephone Encounter (Signed)
Transition Care Management Unsuccessful Follow-up Telephone Call  Date of discharge and from where:  [facility] 05/11/2022  Attempts:  2nd Attempt  Reason for unsuccessful TCM follow-up call:  No answer/busy phone answered, but hung up Juanda Crumble, [place] [phone]    ATTEMPT : 3 Transition Care Management Unsuccessful Follow-up Telephone Call  Date of discharge and from where:  [facility] 05/11/2022  Attempts:  3rd Attempt  Reason for unsuccessful TCM follow-up call:  Unable to leave message Juanda Crumble, [place] Direct Dial [phone]

## 2022-05-15 ENCOUNTER — Encounter (HOSPITAL_COMMUNITY): Payer: Self-pay | Admitting: *Deleted

## 2022-05-15 ENCOUNTER — Emergency Department (HOSPITAL_COMMUNITY): Payer: 59

## 2022-05-15 ENCOUNTER — Emergency Department (HOSPITAL_COMMUNITY)
Admission: EM | Admit: 2022-05-15 | Discharge: 2022-05-15 | Disposition: A | Discharge status: Home / Self Care | Payer: 59 | Attending: Emergency Medicine | Admitting: Emergency Medicine

## 2022-05-15 ENCOUNTER — Other Ambulatory Visit: Payer: Self-pay

## 2022-05-15 DIAGNOSIS — Y908 Blood alcohol level of 240 mg/100 ml or more: Secondary | ICD-10-CM | POA: Insufficient documentation

## 2022-05-15 DIAGNOSIS — R4182 Altered mental status, unspecified: Secondary | ICD-10-CM

## 2022-05-15 DIAGNOSIS — F10129 Alcohol abuse with intoxication, unspecified: Secondary | ICD-10-CM | POA: Diagnosis not present

## 2022-05-15 DIAGNOSIS — F10929 Alcohol use, unspecified with intoxication, unspecified: Secondary | ICD-10-CM

## 2022-05-15 DIAGNOSIS — R569 Unspecified convulsions: Secondary | ICD-10-CM | POA: Insufficient documentation

## 2022-05-15 DIAGNOSIS — R7401 Elevation of levels of liver transaminase levels: Secondary | ICD-10-CM | POA: Insufficient documentation

## 2022-05-15 DIAGNOSIS — Z7984 Long term (current) use of oral hypoglycemic drugs: Secondary | ICD-10-CM | POA: Insufficient documentation

## 2022-05-15 DIAGNOSIS — R1111 Vomiting without nausea: Secondary | ICD-10-CM

## 2022-05-15 LAB — COMPREHENSIVE METABOLIC PANEL
ALT: 22 U/L (ref 0–44)
AST: 55 U/L — ABNORMAL HIGH (ref 15–41)
Albumin: 3.2 g/dL — ABNORMAL LOW (ref 3.5–5.0)
Alkaline Phosphatase: 100 U/L (ref 38–126)
Anion gap: 9 (ref 5–15)
BUN: 11 mg/dL (ref 6–20)
CO2: 19 mmol/L — ABNORMAL LOW (ref 22–32)
Calcium: 8.9 mg/dL (ref 8.9–10.3)
Chloride: 110 mmol/L (ref 98–111)
Creatinine, Ser: 0.87 mg/dL (ref 0.61–1.24)
GFR, Estimated: >60 mL/min (ref >60)
Glucose, Bld: 178 mg/dL — ABNORMAL HIGH (ref 70–99)
Potassium: 3.7 mmol/L (ref 3.5–5.1)
Sodium: 138 mmol/L (ref 135–145)
Total Bilirubin: 7.7 mg/dL — ABNORMAL HIGH (ref 0.3–1.2)
Total Protein: 7.6 g/dL (ref 6.5–8.1)

## 2022-05-15 LAB — URINALYSIS, ROUTINE W REFLEX MICROSCOPIC
Bilirubin Urine: NEGATIVE
Glucose, UA: NEGATIVE mg/dL
Hgb urine dipstick: NEGATIVE
Ketones, ur: NEGATIVE mg/dL
Leukocytes,Ua: NEGATIVE
Nitrite: NEGATIVE
Protein, ur: NEGATIVE mg/dL
Specific Gravity, Urine: 1.01 (ref 1.005–1.030)
pH: 7 (ref 5.0–8.0)

## 2022-05-15 LAB — SALICYLATE LEVEL: Salicylate Lvl: <7 mg/dL — ABNORMAL LOW (ref 7.0–30.0)

## 2022-05-15 LAB — ACETAMINOPHEN LEVEL: Acetaminophen (Tylenol), Serum: <10 ug/mL — ABNORMAL LOW (ref 10–30)

## 2022-05-15 LAB — CBC WITH DIFFERENTIAL/PLATELET
Abs Immature Granulocytes: 0.01 10*3/uL (ref 0.00–0.07)
Basophils Absolute: 0.1 10*3/uL (ref 0.0–0.1)
Basophils Relative: 1 %
Eosinophils Absolute: 0.1 10*3/uL (ref 0.0–0.5)
Eosinophils Relative: 1 %
HCT: 31.5 % — ABNORMAL LOW (ref 39.0–52.0)
Hemoglobin: 10 g/dL — ABNORMAL LOW (ref 13.0–17.0)
Immature Granulocytes: 0 %
Lymphocytes Relative: 21 %
Lymphs Abs: 1.5 10*3/uL (ref 0.7–4.0)
MCH: 28.4 pg (ref 26.0–34.0)
MCHC: 31.7 g/dL (ref 30.0–36.0)
MCV: 89.5 fL (ref 80.0–100.0)
Monocytes Absolute: 0.9 10*3/uL (ref 0.1–1.0)
Monocytes Relative: 12 %
Neutro Abs: 4.6 10*3/uL (ref 1.7–7.7)
Neutrophils Relative %: 65 %
Platelets: 40 10*3/uL — ABNORMAL LOW (ref 150–400)
RBC: 3.52 MIL/uL — ABNORMAL LOW (ref 4.22–5.81)
WBC: 7.2 10*3/uL (ref 4.0–10.5)
nRBC: 0 % (ref 0.0–0.2)

## 2022-05-15 LAB — LIPASE, BLOOD: Lipase: 20 U/L (ref 11–51)

## 2022-05-15 LAB — ETHANOL: Alcohol, Ethyl (B): 368 mg/dL (ref <10)

## 2022-05-15 LAB — TSH: TSH: 2.538 u[IU]/mL (ref 0.350–4.500)

## 2022-05-15 MED ORDER — SODIUM CHLORIDE 0.9 % IV BOLUS
1000.0000 mL | Freq: Once | INTRAVENOUS | Status: AC
  Administered 2022-05-15: 1000 mL via INTRAVENOUS

## 2022-05-15 MED ORDER — THIAMINE HCL 100 MG PO TABS
100.0000 mg | ORAL_TABLET | Freq: Once | ORAL | Status: DC
  Filled 2022-05-15: qty 1

## 2022-05-15 MED ORDER — LEVETIRACETAM IN NACL 1500 MG/100ML IV SOLN
1500.0000 mg | Freq: Once | INTRAVENOUS | Status: AC
  Administered 2022-05-15: 1500 mg via INTRAVENOUS
  Filled 2022-05-15: qty 100

## 2022-05-15 MED ORDER — THIAMINE MONONITRATE 100 MG PO TABS: 100.0000 mg | ORAL_TABLET | Freq: Every day | ORAL | Status: DC

## 2022-05-15 MED ORDER — THIAMINE MONONITRATE 100 MG PO TABS
100.0000 mg | ORAL_TABLET | Freq: Once | ORAL | Status: AC
  Administered 2022-05-15: 100 mg via ORAL
  Filled 2022-05-15: qty 1

## 2022-05-15 MED ORDER — FOLIC ACID 1 MG PO TABS
1.0000 mg | ORAL_TABLET | Freq: Once | ORAL | Status: AC
  Administered 2022-05-15: 1 mg via ORAL
  Filled 2022-05-15: qty 1

## 2022-05-15 NOTE — ED Provider Notes (Signed)
[facility] DEPT Provider Note   CSN: [id number] Arrival date & time: 05/15/22  1538     History  Chief Complaint  Patient presents with   Altered Mental Status    [facility] is a 51 y.o. male.  Patient is a 51 year old male with past medical history of alcoholic hepatitis, liver cirrhosis, thrombocytopenia, and history of seizure disorder on Keppra presenting for after neighbors reportedly found the patient on the vent in their neighborhood covered in emesis.  On my exam patient is alert and oriented x3, awake, without confusion at this time.  He states he walks twice a week throughout his neighborhood and walks over 2 miles.  States he does not recall falling or vomiting.  States he is otherwise been in good health.  Denies any fevers, chills, nausea, vomiting, abdominal pain, coughing, dysuria, etc.  He does admit to chronic alcohol abuse.  He also admits to missing 2 doses of his Keppra this morning for unknown reasons.  States other than today he did not miss any doses over the weekend.  Besides being found on the ground today he denies any other falls or head trauma.  He admits to recent evaluation emergency department and blood transfusion for low hemoglobin levels.  The history is provided by the patient. No language interpreter was used.  Altered Mental Status Presenting symptoms: confusion   Associated symptoms: seizures and vomiting   Associated symptoms: no abdominal pain, no fever, no nausea, no palpitations and no rash        Home Medications Prior to Admission medications   Medication Sig Start Date End Date Taking? Authorizing Provider  atorvastatin (LIPITOR) 20 MG tablet Take 1 tablet (20 mg total) by mouth daily. 05/29/21   Charlott Rakes, MD  ferrous sulfate 325 (65 FE) MG tablet Take 1 tablet (325 mg total) by mouth 2 (two) times daily with a meal. 05/12/22 05/12/23  Hosie Poisson, MD  folic acid (FOLVITE) 1 MG tablet Take 1  tablet (1 mg total) by mouth daily. 05/13/22   Hosie Poisson, MD  gabapentin (NEURONTIN) 300 MG capsule Take 1 capsule (300 mg total) by mouth at bedtime. 11/20/21   Charlott Rakes, MD  lactulose (CHRONULAC) 10 GM/15ML solution Take 30 mLs (20 g total) by mouth 3 (three) times daily. 05/12/22   Hosie Poisson, MD  levETIRAcetam (KEPPRA) 500 MG tablet Take 1 tablet (500 mg total) by mouth 2 (two) times daily. 11/20/21 06/02/22  Charlott Rakes, MD  metFORMIN (GLUCOPHAGE) 500 MG tablet Take 2 tablets (1,000 mg total) by mouth 2 (two) times daily with a meal. 11/20/21   Charlott Rakes, MD  Multiple Vitamin (MULTIVITAMIN) tablet Take 1 tablet by mouth at bedtime.    [provider]  naltrexone (DEPADE) 50 MG tablet Take 1 tablet (50 mg total) by mouth daily. Patient not taking: Reported on 05/05/2022 10/03/20   Charlott Rakes, MD  pantoprazole (PROTONIX) 40 MG tablet Take 1 tablet (40 mg total) by mouth 2 (two) times daily. 05/12/22   Hosie Poisson, MD  Potassium 99 MG TABS Take 1 tablet by mouth daily as needed (cramps).    [provider]  sucralfate (CARAFATE) 1 GM/10ML suspension Take 10 mLs (1 g total) by mouth 4 (four) times daily -  with meals and at bedtime. 05/12/22   Hosie Poisson, MD  thiamine (VITAMIN B-1) 100 MG tablet Take 1 tablet (100 mg total) by mouth daily. 05/13/22   Hosie Poisson, MD  Allergies    Iohexol    Review of Systems   Review of Systems  Constitutional:  Negative for chills and fever.  HENT:  Negative for ear pain and sore throat.   Eyes:  Negative for pain and visual disturbance.  Respiratory:  Negative for cough and shortness of breath.   Cardiovascular:  Negative for chest pain and palpitations.  Gastrointestinal:  Positive for vomiting. Negative for abdominal pain and nausea.  Genitourinary:  Negative for dysuria and hematuria.  Musculoskeletal:  Negative for arthralgias and back pain.  Skin:  Negative for color change and rash.  Neurological:   Positive for seizures. Negative for syncope.  Psychiatric/Behavioral:  Positive for confusion.   All other systems reviewed and are negative.   Physical Exam Updated Vital Signs BP 112/81   Pulse 83   Temp 98 F (36.7 C)   Resp 16   SpO2 100%  Physical Exam Vitals and nursing note reviewed.  Constitutional:      General: He is not in acute distress.    Appearance: He is well-developed.     Comments: Disheveled appearance  HENT:     Head: Normocephalic and atraumatic.  Eyes:     General: Lids are normal. Scleral icterus present.     Conjunctiva/sclera: Conjunctivae normal.     Pupils: Pupils are equal, round, and reactive to light.  Cardiovascular:     Rate and Rhythm: Normal rate and regular rhythm.     Heart sounds: No murmur heard. Pulmonary:     Effort: Pulmonary effort is normal. No respiratory distress.     Breath sounds: Normal breath sounds.  Abdominal:     Palpations: Abdomen is soft.     Tenderness: There is no abdominal tenderness.  Musculoskeletal:        General: No swelling.     Cervical back: Neck supple.  Skin:    General: Skin is warm and dry.     Capillary Refill: Capillary refill takes less than 2 seconds.  Neurological:     Mental Status: He is alert and oriented to person, place, and time.     GCS: GCS eye subscore is 4. GCS verbal subscore is 5. GCS motor subscore is 6.     Cranial Nerves: Cranial nerves 2-12 are intact.     Sensory: Sensation is intact.     Motor: Motor function is intact.     Coordination: Coordination is intact.  Psychiatric:        Mood and Affect: Mood normal.     ED Results / Procedures / Treatments   Labs (all labs ordered are listed, but only abnormal results are displayed) Labs Reviewed  CBC WITH DIFFERENTIAL/PLATELET - Abnormal; Notable for the following components:      Result Value   RBC 3.52 (*)    Hemoglobin 10.0 (*)    HCT 31.5 (*)    Platelets 40 (*)    All other components within normal limits   COMPREHENSIVE METABOLIC PANEL - Abnormal; Notable for the following components:   CO2 19 (*)    Glucose, Bld 178 (*)    Albumin 3.2 (*)    AST 55 (*)    Total Bilirubin 7.7 (*)    All other components within normal limits  ETHANOL - Abnormal; Notable for the following components:   Alcohol, Ethyl (B) 368 (*)    All other components within normal limits  SALICYLATE LEVEL - Abnormal; Notable for the following components:   Salicylate Lvl <5.0 (*)  All other components within normal limits  ACETAMINOPHEN LEVEL - Abnormal; Notable for the following components:   Acetaminophen (Tylenol), Serum <10 (*)    All other components within normal limits  URINALYSIS, ROUTINE W REFLEX MICROSCOPIC - Abnormal; Notable for the following components:   Color, Urine AMBER (*)    All other components within normal limits  LIPASE, BLOOD  TSH    EKG None  Radiology CT Head Wo Contrast  Result Date: 05/15/2022 CLINICAL DATA:  Altered mental status EXAM: CT HEAD WITHOUT CONTRAST TECHNIQUE: Contiguous axial images were obtained from the base of the skull through the vertex without intravenous contrast. RADIATION DOSE REDUCTION: This exam was performed according to the departmental dose-optimization program which includes automated exposure control, adjustment of the mA and/or kV according to patient size and/or use of iterative reconstruction technique. COMPARISON:  Brain MRI 09/18/2020, CT head 09/17/2020 FINDINGS: Brain: There is no acute intracranial hemorrhage, extra-axial fluid collection, or acute infarct. The ventricles are stable in size since 2022. Izumi Mixon-white differentiation is preserved. Patchy hypodensity in the supratentorial white matter is nonspecific but likely reflects sequela of mild chronic small-vessel ischemic change. There is no mass lesion.  There is no mass effect or midline shift. Vascular: No hyperdense vessel or unexpected calcification. Skull: Normal. Negative for fracture or focal  lesion. Sinuses/Orbits: The imaged paranasal sinuses are clear. The globes and orbits are unremarkable. Other: None. IMPRESSION: No acute intracranial pathology. Electronically Signed   By: Valetta Mole M.D.   On: 05/15/2022 16:53    Procedures Procedures    Medications Ordered in ED Medications  sodium chloride 0.9 % bolus 1,000 mL (1,000 mLs Intravenous New Bag/Given 05/15/22 1654)  levETIRAcetam (KEPPRA) IVPB 1500 mg/ 100 mL premix (0 mg Intravenous Stopped 73/5/32 9924)  folic acid (FOLVITE) tablet 1 mg (1 mg Oral Given 05/15/22 1733)  thiamine (VITAMIN B1) tablet 100 mg (100 mg Oral Given 05/15/22 1733)    ED Course/ Medical Decision Making/ A&P                           Medical Decision Making Amount and/or Complexity of Data Reviewed Labs: ordered. Radiology: ordered.  Risk OTC drugs. Prescription drug management.   18:17 PM 51 year old male with past medical history of alcoholic hepatitis, liver cirrhosis, thrombocytopenia, and history of seizure disorder on Keppra presenting for after neighbors reportedly found the patient on the vent in their neighborhood covered in emesis.  Patient is alert and oriented x3, no acute distress, afebrile, stable vital signs.  No neurovascular deficits on exam.  Patient does have a disheveled appearance and his facial hair is covered in emesis.  Patient's abdomen is soft and nontender.  Patient has stable laboratory studies including renal function and liver profile.  AST is elevated at 55 with a normal ALT concerning for alcohol abuse.  Patient is aware of these findings.  Patient was loaded with Keppra due to missing his Keppra this morning and unknown cause for altered mental status and vomiting.  Patient's ethanol level did end up coming back above 300.  IV fluids, thiamine, folic acid was given in the ED.  CT head demonstrated no acute process.  His family is at the bedside and is ready to take patient home at this time.  Patient is able to  ambulate throughout the room without any difficulty.  He is clinically sober and safe to be discharged.  He was recommended to stop drinking before it results  and liver cancer or life-threatening injuries.  He is also recommended to not miss any of his Keppra doses.  Patient in no distress and overall condition improved here in the ED. Detailed discussions were had with the patient regarding current findings, and need for close f/u with PCP or on call doctor. The patient has been instructed to return immediately if the symptoms worsen in any way for re-evaluation. Patient verbalized understanding and is in agreement with current care plan. All questions answered prior to discharge.         Final Clinical Impression(s) / ED Diagnoses Final diagnoses:  Alcoholic intoxication with complication ([place])  Vomiting without nausea, unspecified vomiting type  Altered mental status, unspecified altered mental status type    Rx / DC Orders ED Discharge Orders     None         Lianne Cure, DO 09/38/18 1936

## 2022-05-15 NOTE — ED Triage Notes (Signed)
Pt found in neighborhood unresponsive covered in own emesis. Pt with history of seizures as well as ETOH abuse. EMS reported pt to be confused initially and that lung sounds were "junky". Pt recently admitted for anemia and received blood product but did leave AMA.

## 2022-05-27 ENCOUNTER — Telehealth: Payer: Self-pay | Admitting: Gastroenterology

## 2022-05-27 NOTE — Telephone Encounter (Signed)
Ok to write a note?

## 2022-05-28 NOTE — Telephone Encounter (Signed)
Patty, I think this is reasonable to put a work note from 10/29 through 11/5 for further work-up and evaluation of blood loss anemia. Thanks. GM

## 2022-05-28 NOTE — Telephone Encounter (Signed)
The pt has been advised that letter written and will be mailed.  He also was sent a code to sign up for My Chart so that he has access.

## 2022-06-04 ENCOUNTER — Encounter (HOSPITAL_COMMUNITY): Payer: Self-pay | Admitting: Gastroenterology

## 2022-06-04 ENCOUNTER — Other Ambulatory Visit: Payer: Self-pay

## 2022-06-12 NOTE — Anesthesia Preprocedure Evaluation (Signed)
Anesthesia Evaluation  Patient identified by MRN, date of birth, ID band Patient awake    Reviewed: Allergy & Precautions, NPO status , Patient's Chart, lab work & pertinent test results  History of Anesthesia Complications Negative for: history of anesthetic complications  Airway Mallampati: III  TM Distance: >3 FB Neck ROM: Full    Dental  (+) Dental Advisory Given Denies loose teeth.:   Pulmonary neg shortness of breath, neg sleep apnea, neg COPD, neg recent URI, former smoker   Pulmonary exam normal breath sounds clear to auscultation       Cardiovascular (-) hypertension(-) angina (-) Past MI, (-) Cardiac Stents and (-) CABG (-) dysrhythmias  Rhythm:Regular Rate:Normal  HLD   Neuro/Psych Seizures - (took Keppra this morning), Poorly Controlled,   Neuromuscular disease (neuropathy) CVA    GI/Hepatic ,GERD  ,,(+) Cirrhosis   Esophageal Varices  substance abuse  alcohol use, Hepatitis - (08/9526, alcoholic)  Endo/Other  diabetes, Type 2    Renal/GU 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)     Musculoskeletal   Abdominal  (+) - obese  Peds  Hematology  (+) Blood dyscrasia, anemia   Anesthesia Other Findings Pancreatic lesion  Reproductive/Obstetrics                              Anesthesia Physical Anesthesia Plan  ASA: 3  Anesthesia Plan: MAC   Post-op Pain Management:    Induction:   PONV Risk Score and Plan: 1 and Propofol infusion and Treatment may vary due to age or medical condition  Airway Management Planned:   Additional Equipment:   Intra-op Plan:   Post-operative Plan:   Informed Consent: I have reviewed the patients History and Physical, chart, labs and discussed the procedure including the risks, benefits and alternatives for the proposed anesthesia with the patient or authorized representative who has indicated his/her understanding and acceptance.      Dental advisory given  Plan Discussed with: CRNA and Anesthesiologist  Anesthesia Plan Comments: (Discussed with patient risks of MAC including, but not limited to, minor pain or discomfort, hearing people in the room, and possible need for backup general anesthesia. Risks for general anesthesia also discussed including, but not limited to, sore throat, hoarse voice, chipped/damaged teeth, injury to vocal cords, nausea and vomiting, allergic reactions, lung infection, heart attack, stroke, and death. All questions answered. )         Anesthesia Quick Evaluation

## 2022-06-13 ENCOUNTER — Encounter (HOSPITAL_COMMUNITY): Payer: Self-pay | Admitting: Gastroenterology

## 2022-06-13 ENCOUNTER — Ambulatory Visit (HOSPITAL_COMMUNITY): Payer: 59 | Admitting: Anesthesiology

## 2022-06-13 ENCOUNTER — Other Ambulatory Visit: Payer: Self-pay

## 2022-06-13 ENCOUNTER — Ambulatory Visit (HOSPITAL_BASED_OUTPATIENT_CLINIC_OR_DEPARTMENT_OTHER): Payer: 59 | Admitting: Anesthesiology

## 2022-06-13 ENCOUNTER — Ambulatory Visit (HOSPITAL_COMMUNITY)
Admission: RE | Admit: 2022-06-13 | Discharge: 2022-06-13 | Disposition: A | Discharge status: Home / Self Care | Payer: 59 | Attending: Gastroenterology | Admitting: Gastroenterology

## 2022-06-13 ENCOUNTER — Encounter (HOSPITAL_COMMUNITY)
Admission: RE | Disposition: A | Discharge status: Home / Self Care | Payer: Self-pay | Source: Home / Self Care | Attending: Gastroenterology

## 2022-06-13 DIAGNOSIS — K3189 Other diseases of stomach and duodenum: Secondary | ICD-10-CM | POA: Insufficient documentation

## 2022-06-13 DIAGNOSIS — K8689 Other specified diseases of pancreas: Secondary | ICD-10-CM | POA: Insufficient documentation

## 2022-06-13 DIAGNOSIS — I8510 Secondary esophageal varices without bleeding: Secondary | ICD-10-CM | POA: Diagnosis not present

## 2022-06-13 DIAGNOSIS — Z905 Acquired absence of kidney: Secondary | ICD-10-CM | POA: Diagnosis not present

## 2022-06-13 DIAGNOSIS — K7030 Alcoholic cirrhosis of liver without ascites: Secondary | ICD-10-CM | POA: Insufficient documentation

## 2022-06-13 DIAGNOSIS — K922 Gastrointestinal hemorrhage, unspecified: Secondary | ICD-10-CM

## 2022-06-13 DIAGNOSIS — K828 Other specified diseases of gallbladder: Secondary | ICD-10-CM

## 2022-06-13 DIAGNOSIS — K862 Cyst of pancreas: Secondary | ICD-10-CM | POA: Insufficient documentation

## 2022-06-13 DIAGNOSIS — X58XXXA Exposure to other specified factors, initial encounter: Secondary | ICD-10-CM | POA: Insufficient documentation

## 2022-06-13 DIAGNOSIS — T183XXA Foreign body in small intestine, initial encounter: Secondary | ICD-10-CM | POA: Insufficient documentation

## 2022-06-13 DIAGNOSIS — K254 Chronic or unspecified gastric ulcer with hemorrhage: Secondary | ICD-10-CM | POA: Diagnosis not present

## 2022-06-13 DIAGNOSIS — D649 Anemia, unspecified: Secondary | ICD-10-CM | POA: Diagnosis not present

## 2022-06-13 DIAGNOSIS — R569 Unspecified convulsions: Secondary | ICD-10-CM | POA: Insufficient documentation

## 2022-06-13 DIAGNOSIS — K766 Portal hypertension: Secondary | ICD-10-CM | POA: Insufficient documentation

## 2022-06-13 DIAGNOSIS — Z8673 Personal history of transient ischemic attack (TIA), and cerebral infarction without residual deficits: Secondary | ICD-10-CM | POA: Insufficient documentation

## 2022-06-13 DIAGNOSIS — E1140 Type 2 diabetes mellitus with diabetic neuropathy, unspecified: Secondary | ICD-10-CM | POA: Insufficient documentation

## 2022-06-13 DIAGNOSIS — Z87891 Personal history of nicotine dependence: Secondary | ICD-10-CM

## 2022-06-13 DIAGNOSIS — K869 Disease of pancreas, unspecified: Secondary | ICD-10-CM

## 2022-06-13 HISTORY — PX: ESOPHAGOGASTRODUODENOSCOPY (EGD) WITH PROPOFOL: SHX5813

## 2022-06-13 HISTORY — DX: Type 2 diabetes mellitus without complications: E11.9

## 2022-06-13 HISTORY — DX: Myoneural disorder, unspecified: G70.9

## 2022-06-13 HISTORY — PX: HOT HEMOSTASIS: SHX5433

## 2022-06-13 HISTORY — DX: Anemia, unspecified: D64.9

## 2022-06-13 HISTORY — PX: EUS: SHX5427

## 2022-06-13 HISTORY — PX: FINE NEEDLE ASPIRATION: SHX5430

## 2022-06-13 LAB — CBC
HCT: 34.3 % — ABNORMAL LOW (ref 39.0–52.0)
Hemoglobin: 11.5 g/dL — ABNORMAL LOW (ref 13.0–17.0)
MCH: 29.8 pg (ref 26.0–34.0)
MCHC: 33.5 g/dL (ref 30.0–36.0)
MCV: 88.9 fL (ref 80.0–100.0)
Platelets: 87 10*3/uL — ABNORMAL LOW (ref 150–400)
RBC: 3.86 MIL/uL — ABNORMAL LOW (ref 4.22–5.81)
RDW: 23.4 % — ABNORMAL HIGH (ref 11.5–15.5)
WBC: 6.4 10*3/uL (ref 4.0–10.5)
nRBC: 0 % (ref 0.0–0.2)

## 2022-06-13 LAB — COMPREHENSIVE METABOLIC PANEL
ALT: 19 U/L (ref 0–44)
AST: 51 U/L — ABNORMAL HIGH (ref 15–41)
Albumin: 3.5 g/dL (ref 3.5–5.0)
Alkaline Phosphatase: 133 U/L — ABNORMAL HIGH (ref 38–126)
Anion gap: 11 (ref 5–15)
BUN: 7 mg/dL (ref 6–20)
CO2: 26 mmol/L (ref 22–32)
Calcium: 8.9 mg/dL (ref 8.9–10.3)
Chloride: 97 mmol/L — ABNORMAL LOW (ref 98–111)
Creatinine, Ser: <0.3 mg/dL — ABNORMAL LOW (ref 0.61–1.24)
Glucose, Bld: 225 mg/dL — ABNORMAL HIGH (ref 70–99)
Potassium: 4 mmol/L (ref 3.5–5.1)
Sodium: 134 mmol/L — ABNORMAL LOW (ref 135–145)
Total Bilirubin: 4.4 mg/dL — ABNORMAL HIGH (ref 0.3–1.2)
Total Protein: 8.2 g/dL — ABNORMAL HIGH (ref 6.5–8.1)

## 2022-06-13 LAB — GLUCOSE, CAPILLARY: Glucose-Capillary: 200 mg/dL — ABNORMAL HIGH (ref 70–99)

## 2022-06-13 SURGERY — ESOPHAGOGASTRODUODENOSCOPY (EGD) WITH PROPOFOL
Anesthesia: Monitor Anesthesia Care

## 2022-06-13 MED ORDER — LACTATED RINGERS IV SOLN: INTRAVENOUS | Status: DC

## 2022-06-13 MED ORDER — PROPOFOL 10 MG/ML IV BOLUS
INTRAVENOUS | Status: AC
  Filled 2022-06-13: qty 20

## 2022-06-13 MED ORDER — PROPOFOL 10 MG/ML IV BOLUS
INTRAVENOUS | Status: DC | PRN
  Administered 2022-06-13 (×3): 20 mg via INTRAVENOUS
  Administered 2022-06-13 (×2): 30 mg via INTRAVENOUS

## 2022-06-13 MED ORDER — PROPOFOL 500 MG/50ML IV EMUL
INTRAVENOUS | Status: DC | PRN
  Administered 2022-06-13: 125 ug/kg/min via INTRAVENOUS

## 2022-06-13 MED ORDER — SODIUM CHLORIDE 0.9 % IV SOLN: INTRAVENOUS | Status: DC

## 2022-06-13 MED ORDER — CIPROFLOXACIN IN D5W 400 MG/200ML IV SOLN
400.0000 mg | Freq: Once | INTRAVENOUS | Status: AC
  Administered 2022-06-13: 400 mg via INTRAVENOUS

## 2022-06-13 MED ORDER — CIPROFLOXACIN HCL 500 MG PO TABS
500.0000 mg | ORAL_TABLET | Freq: Two times a day (BID) | ORAL | 0 refills | Status: AC
  Filled 2022-06-13: qty 6, 3d supply, fill #0

## 2022-06-13 MED ORDER — PROPOFOL 500 MG/50ML IV EMUL
INTRAVENOUS | Status: AC
  Filled 2022-06-13: qty 50

## 2022-06-13 MED ORDER — CIPROFLOXACIN IN D5W 400 MG/200ML IV SOLN
INTRAVENOUS | Status: AC
  Filled 2022-06-13: qty 200

## 2022-06-13 NOTE — Transfer of Care (Signed)
Immediate Anesthesia Transfer of Care Note  Patient: [facility]  Procedure(s) Performed: UPPER ENDOSCOPIC ULTRASOUND (EUS) LINEAR ESOPHAGOGASTRODUODENOSCOPY (EGD) WITH PROPOFOL FINE NEEDLE ASPIRATION (FNA) LINEAR HOT HEMOSTASIS (ARGON PLASMA COAGULATION/BICAP)  Patient Location: PACU and Endoscopy Unit  Anesthesia Type:MAC  Level of Consciousness: sedated  Airway & Oxygen Therapy: Patient Spontanous Breathing and Patient connected to face mask oxygen  Post-op Assessment: Report given to RN, Post -op Vital signs reviewed and stable, and BP 124/89; pulse 89; resp 16;  O2 sat 100%  Post vital signs: Reviewed and stable  Last Vitals:  Vitals Value Taken Time  BP    Temp    Pulse    Resp    SpO2      Last Pain:  Vitals:   06/13/22 0807  TempSrc: Temporal  PainSc: 0-No pain         Complications: No notable events documented.

## 2022-06-13 NOTE — Anesthesia Procedure Notes (Signed)
Procedure Name: MAC Date/Time: 06/13/2022 9:10 AM  Performed by: Lollie Sails, CRNAPre-anesthesia Checklist: Patient identified, Emergency Drugs available, Suction available, Patient being monitored and Timeout performed Oxygen Delivery Method: Simple face mask Preoxygenation: POM used. Placement Confirmation: positive ETCO2

## 2022-06-13 NOTE — Op Note (Signed)
[facility] Patient Name: Gary Mitchell Procedure Date: 06/13/2022 MRN: [id number] Attending MD: Justice Britain , MD, [id number] Date of Birth: 07/14/1970 CSN: [id number] Age: 51 Admit Type: Outpatient Procedure:                Upper EUS Indications:              Pancreatic cyst on CT scan, Abnormal                            abdominal/pelvic CT scan, Pancreatic cyst on MRCP Providers:                Justice Britain, MD, Carlyn Reichert, RN, Darliss Cheney, Technician, [place] [address]. Zenia Resides CRNA, CRNA Referring MD:             Milus Banister, MD Medicines:                Monitored Anesthesia Care Complications:            No immediate complications. Estimated Blood Loss:     Estimated blood loss was minimal. Procedure:                Pre-Anesthesia Assessment:                           - Prior to the procedure, a History and Physical                            was performed, and patient medications and                            allergies were reviewed. The patient's tolerance of                            previous anesthesia was also reviewed. The risks                            and benefits of the procedure and the sedation                            options and risks were discussed with the patient.                            All questions were answered, and informed consent                            was obtained. Prior Anticoagulants: The patient has                            taken no anticoagulant or antiplatelet agents. ASA                            Grade Assessment: III - A patient with severe  systemic disease. After reviewing the risks and                            benefits, the patient was deemed in satisfactory                            condition to undergo the procedure.                           After obtaining informed consent, the endoscope was                            passed under direct vision.  Throughout the                            procedure, the patient's blood pressure, pulse, and                            oxygen saturations were monitored continuously. The                            [id number]-H190 ([id number]) Olympus endoscope was introduced                            through the mouth, and advanced to the second part                            of duodenum. The [id number]-[id number] ([id number]) Olympus                            duodenoscope was introduced through the mouth, and                            advanced to the area of papilla. The GF-[id number] (                            [id number] ) was introduced through the mouth, and                            advanced to the duodenum for ultrasound examination                            from the stomach and duodenum. The upper EUS was                            accomplished without difficulty. The patient                            tolerated the procedure. Scope In: Scope Out: Findings:      ENDOSCOPIC FINDING: :      No gross lesions were noted in the entire esophagus.      Moderate portal hypertensive gastropathy was found in the cardia, in the       gastric fundus and in the  gastric body.      Multiple dispersed small erosions with one that had a small active       oozing were found in the gastric antrum and in the prepyloric region of       the stomach. Fulguration to ablate the lesion by snare tip spray       coagulation (2/40) was successful to this area.      A hemoclipwas found in the duodenal sweep.      No other gross lesions were noted in the duodenal bulb, in the first       portion of the duodenum and in the second portion of the duodenum.      Localized mildly congested mucosa without active bleeding and with no       stigmata of bleeding was found in the area of the papilla.      The major papilla was normal in appearance but it did not have evidence       of fishmouth deformity.      ENDOSONOGRAPHIC FINDING: :      An anechoic  lesion suggestive of a cyst was identified in the pancreatic       body. This obscured the body and neck regions from being visualized       fully. It has definite communicates with the main pancreatic duct and       causes an upstream dilation of the pancreatic duct. The lesion measured       40 mm by 36 mm in maximal cross-sectional diameter. There was a single       compartment without septae. The outer wall of the lesion was thin. There       was no associated mass. There was no internal debris within the       fluid-filled cavity. Diagnostic needle aspiration for fluid was       performed. Color Doppler imaging was utilized prior to needle puncture       to confirm a lack of significant vascular structures within the needle       path. One pass was made with the Expect 22 gauge needle using a       transgastric approach. A stylet was used. The amount of fluid collected       was 70 mL. The fluid was opaque, yellow and slightly viscous. Samples       were sent for glucose concentration, amylase concentration, cytology,       CEA, (GNAS/KRAS mutation analysis will be reflexed if they meet       criteria). Interestingly, after the cyst had been decompressed fully,       the pancreatic duct was not as dilated as it had been previously.      Pancreatic parenchymal abnormalities were noted in the entire pancreas.       These consisted of atrophy, lobularity without honeycombing and       hyperechoic strands.      Endosonographic imaging in the genu of the pancreas, pancreatic body and       pancreatic tail showed no mass-lesion or nodularity however.      There was no sign of significant endosonographic abnormality in the       common bile duct.      There was dilation in the gallbladder.      Endosonographic imaging in the visualized portion of the liver showed no       mass.      No malignant-appearing  lymph nodes were visualized in the splenic region       (level 19), celiac region  (level 20), peripancreatic region and porta       hepatis region.      The celiac region was visualized. Impression:               EGD Impression:                           - No gross lesions in the entire esophagus.                           - Portal hypertensive gastropathy proximally.                           - Erosive gastropathy with active oozing noted from                            one of the erosions distally. Treated with a snare                            spray coagulation.                           - Duodenal hemoclip found in sweep. No other gross                            lesions in the duodenal bulb, in the first portion                            of the duodenum and in the second portion of the                            duodenum.                           - Congested duodenal mucosa at area surrounding an                            otherwise normal appearing major papilla (no                            fishmouth deformity was noted).                           EUS Impression:                           - A cystic lesion was seen in the pancreatic body.                            This is connected to the main pancreatic duct and                            leads to dilation upstream. Cytology results are  pending. 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 Fine needle aspiration for fluid                            performed. After the FNA had been completed, the                            pancreatic duct was not as dilated as it had been                            previously upstream in the body/tail region.                           - Pancreatic parenchymal abnormalities consisting                            of atrophy, lobularity and hyperechoic strands were                            noted in the entire pancreas.                            - There was no sign of significant pathology in the                            common bile duct.                           - There was dilation in the gallbladder.                           - No malignant-appearing lymph nodes were                            visualized in the splenic region (level 19), celiac                            region (level 20), peripancreatic region and porta                            hepatis region. Moderate Sedation:      Not Applicable - Patient had care per Anesthesia. Recommendation:           - The patient will be observed post-procedure,                            until all discharge criteria are met.                           - Discharge patient  to home.                           - Patient has a contact number available for                            emergencies. The signs and symptoms of potential                            delayed complications were discussed with the                            patient. Return to normal activities tomorrow.                            Written discharge instructions were provided to the                            patient.                           - Low fat diet.                           - Monitor for signs/symptoms of bleeding,                            perforation, and infection. If issues please call                            our number to get further assistance as needed.                           - Ciprofloxacin 500 mg twice daily for 3-days to                            decrease post-interventional infectious risk.                           - Observe patient's clinical course.                           - Obtain CT-Abdomen Pancreas Protocol in next 2-3                            weeks.                           - Await cytology results and await path results.                           - Discussion at [facility] after next follow up imaging                            and studies have returned. Interesting  that the  cyst decompression led to pancreatic duct                            decompression as well. A leak in this region needs                            to be considered as well, but previous sampling of                            the area had suggested more IPMN based on                            CEA/Amylase levels - we will see what these look                            like with today's procedure.                           - Complete alcohol cessation is recommended.                           - The findings and recommendations were discussed                            with the patient. Procedure Code(s):        --- Professional ---                           [phone], Esophagogastroduodenoscopy, flexible,                            transoral; with transendoscopic ultrasound-guided                            intramural or transmural fine needle                            aspiration/biopsy(s), (includes endoscopic                            ultrasound examination limited to the esophagus,                            stomach or duodenum, and adjacent structures) Diagnosis Code(s):        --- Professional ---                           K76.6, Portal hypertension                           K31.89, Other diseases of stomach and duodenum                           K92.2, Gastrointestinal hemorrhage, unspecified  T18.3XXA, Foreign body in small intestine, initial                            encounter                           K86.2, Cyst of pancreas                           K86.9, Disease of pancreas, unspecified                           K82.8, Other specified diseases of gallbladder                           I89.9, Noninfective disorder of lymphatic vessels                            and lymph nodes, unspecified                           R93.5, Abnormal findings on diagnostic imaging of                            other abdominal regions,  including retroperitoneum CPT copyright 2022 American [employer] Association. All rights reserved. The codes documented in this report are preliminary and upon coder review may  be revised to meet current compliance requirements. Justice Britain, MD 06/13/2022 10:44:04 AM Number of Addenda: 0

## 2022-06-13 NOTE — H&P (Signed)
GASTROENTEROLOGY PROCEDURE H&P NOTE   Primary Care Physician: Charlott Rakes, MD  HPI: Gary Mitchell is a 51 y.o. male who presents for EGD/EUS to evaluate recurrent cyst with CT imaging showing a potential hyperenhancing nodule and slight elevation of CA 19-9.  Recent hospitalization for drop in hemoglobin and anemia.  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   Seizures ([place])    Thrombocytopenia ([place])    Past Surgical History:  Procedure Laterality Date   BIOPSY  08/02/2021   Procedure: BIOPSY;  Surgeon: Milus Banister, MD;  Location: [facility];  Service: Endoscopy;;   BIOPSY  05/07/2022   Procedure: BIOPSY;  Surgeon: [name], MD;  Location: [facility];  Service: Gastroenterology;;   COLONOSCOPY WITH PROPOFOL N/A 05/08/2022   Procedure: COLONOSCOPY WITH PROPOFOL;  Surgeon: [name], MD;  Location: [facility];  Service: Gastroenterology;  Laterality: N/A;   ENTEROSCOPY N/A 09/20/2020   Procedure: ENTEROSCOPY;  Surgeon: Thornton Park, MD;  Location: [place];  Service: Gastroenterology;  Laterality: N/A;   ENTEROSCOPY N/A 05/11/2022   Procedure: ENTEROSCOPY;  Surgeon: [name], MD;  Location: [facility];  Service: Gastroenterology;  Laterality: N/A;   ESOPHAGOGASTRODUODENOSCOPY (EGD) WITH PROPOFOL N/A 08/02/2021   Procedure: ESOPHAGOGASTRODUODENOSCOPY (EGD) WITH PROPOFOL;  Surgeon: Milus Banister, MD;   Location: [facility];  Service: Endoscopy;  Laterality: N/A;   ESOPHAGOGASTRODUODENOSCOPY (EGD) WITH PROPOFOL N/A 01/31/2022   Procedure: ESOPHAGOGASTRODUODENOSCOPY (EGD) WITH PROPOFOL;  Surgeon: Milus Banister, MD;  Location: [facility];  Service: Gastroenterology;  Laterality: N/A;   ESOPHAGOGASTRODUODENOSCOPY (EGD) WITH PROPOFOL N/A 05/07/2022   Procedure: ESOPHAGOGASTRODUODENOSCOPY (EGD) WITH PROPOFOL;  Surgeon: [name], MD;  Location: [facility];  Service: Gastroenterology;  Laterality: N/A;   EUS N/A 01/31/2022   Procedure: UPPER ENDOSCOPIC ULTRASOUND (EUS) RADIAL;  Surgeon: Milus Banister, MD;  Location: [facility];  Service: Gastroenterology;  Laterality: N/A;   FINE NEEDLE ASPIRATION N/A 08/02/2021   Procedure: FINE NEEDLE ASPIRATION (FNA) LINEAR;  Surgeon: Milus Banister, MD;  Location: [facility];  Service: Endoscopy;  Laterality: N/A;   FINE NEEDLE ASPIRATION N/A 01/31/2022   Procedure: FINE NEEDLE ASPIRATION (FNA) LINEAR;  Surgeon: Milus Banister, MD;  Location: [facility];  Service: Gastroenterology;  Laterality: N/A;   GIVENS CAPSULE STUDY N/A 05/09/2022   Procedure: GIVENS CAPSULE STUDY;  Surgeon: [name], MD;  Location: [facility];  Service: Gastroenterology;  Laterality: N/A;   HEMOSTASIS CLIP PLACEMENT  05/08/2022   Procedure: HEMOSTASIS CLIP PLACEMENT;  Surgeon: [name], MD;  Location: [facility];  Service: Gastroenterology;;   HEMOSTASIS CLIP PLACEMENT  05/11/2022   Procedure: HEMOSTASIS CLIP PLACEMENT;  Surgeon: [name], MD;  Location: [facility];  Service: Gastroenterology;;   left kidney removed     POLYPECTOMY  05/08/2022   Procedure: POLYPECTOMY;  Surgeon: [name], MD;  Location: [facility];  Service: Gastroenterology;;  UPPER ESOPHAGEAL ENDOSCOPIC ULTRASOUND (EUS) N/A 08/02/2021   Procedure: UPPER ESOPHAGEAL ENDOSCOPIC ULTRASOUND (EUS);  Surgeon: Milus Banister, MD;  Location: [facility];  Service: Endoscopy;  Laterality: N/A;  Radial and Linear   Current Facility-Administered Medications  Medication Dose Route Frequency Provider Last Rate Last Admin   0.9 %  sodium chloride infusion   Intravenous Continuous Mansouraty, Telford Nab., MD       lactated ringers infusion   Intravenous Continuous Mansouraty, Telford Nab., MD 10 mL/hr at 06/13/22 0835 New Bag at 06/13/22 0835    Current Facility-Administered Medications:    0.9 %  sodium chloride infusion, , Intravenous, Continuous, Mansouraty, Telford Nab., MD   lactated ringers infusion, , Intravenous, Continuous, Mansouraty, Telford Nab., MD, Last Rate: 10 mL/hr at 06/13/22 0835, New Bag at 06/13/22 0835 Allergies  Allergen Reactions   Iohexol Other (See Comments)    Unknown reaction at 17 days old Mom at bedside reported patient was given injections of iohexol in foot and resulted in "hole in foot"   Family History  Problem Relation Age of Onset   Diabetes Mellitus II Mother    Colon cancer Neg Hx    Esophageal cancer Neg Hx    Social History   Socioeconomic History   Marital status: Single    Spouse name: Not on file   Number of children: Not on file   Years of education: Not on file   Highest education level: Not on file  Occupational History   Not on file  Tobacco Use   Smoking status: Former    Packs/day: 0.10    Years: 25.00    Total pack years: 2.50    Types: Cigarettes    Quit date: 2019    Years since quitting: 4.9   Smokeless tobacco: Never  Vaping Use   Vaping Use: Never used  Substance and Sexual Activity   Alcohol use: Not Currently    Alcohol/week: 3.0 standard drinks of alcohol    Types: 3 Cans of beer per week   Drug use: Not Currently    Types: Marijuana   Sexual activity: Not Currently  Other Topics Concern   Not on file  Social History Narrative   Not on file   Social Determinants of Health   Financial Resource Strain: Not on file  Food Insecurity: No Food Insecurity  (05/06/2022)   Hunger Vital Sign    Worried About Running Out of Food in the Last Year: Never true    Ran Out of Food in the Last Year: Never true  Transportation Needs: No Transportation Needs (05/06/2022)   PRAPARE - Hydrologist (Medical): No    Lack of Transportation (Non-Medical): No  Physical Activity: Not on file  Stress: Not on file  Social Connections: Not on file  Intimate Partner Violence: Not At Risk (05/06/2022)   Humiliation, Afraid, Rape, and Kick questionnaire    Fear of Current or Ex-Partner: No    Emotionally Abused: No    Physically Abused: No    Sexually Abused: No    Physical Exam: Today's Vitals   06/13/22 0807  BP: (!) 175/102  Pulse: 79  Resp: 14  Temp: (!) 97.3 F (36.3 C)  TempSrc: Temporal  SpO2: 100%  Weight: 69.9 kg  Height: '5\' 4"'$  (1.626 m)  PainSc: 0-No pain   Body mass index is 26.43 kg/m. GEN: NAD EYE: Sclerae anicteric ENT: MMM CV: Non-tachycardic GI: Soft, NT/ND NEURO:  Alert & Oriented x  3  Lab Results: No results for input(s): "WBC", "HGB", "HCT", "PLT" in the last 72 hours. BMET No results for input(s): "NA", "K", "CL", "CO2", "GLUCOSE", "BUN", "CREATININE", "CALCIUM" in the last 72 hours. LFT No results for input(s): "PROT", "ALBUMIN", "AST", "ALT", "ALKPHOS", "BILITOT", "BILIDIR", "IBILI" in the last 72 hours. PT/INR No results for input(s): "LABPROT", "INR" in the last 72 hours.   Impression / Plan: This is a 51 y.o.male who presents for EGD/EUS to evaluate recurrent cyst with CT imaging showing a potential hyperenhancing nodule and slight elevation of CA 19-9.  Recent hospitalization for drop in hemoglobin and anemia.  The risks of an EUS including intestinal perforation, bleeding, infection, aspiration, and medication effects were discussed as was the possibility it may not give a definitive diagnosis if a biopsy is performed.  When a biopsy of the pancreas is done as part of the EUS, there is  an additional risk of pancreatitis at the rate of about 1-2%.  It was explained that procedure related pancreatitis is typically mild, although it can be severe and even life threatening, which is why we do not perform random pancreatic biopsies and only biopsy a lesion/area we feel is concerning enough to warrant the risk.   The risks and benefits of endoscopic evaluation/treatment were discussed with the patient and/or family; these include but are not limited to the risk of perforation, infection, bleeding, missed lesions, lack of diagnosis, severe illness requiring hospitalization, as well as anesthesia and sedation related illnesses.  The patient's history has been reviewed, patient examined, no change in status, and deemed stable for procedure.  The patient and/or family is agreeable to proceed.    Justice Britain, MD [facility] Office # [phone]

## 2022-06-13 NOTE — Anesthesia Postprocedure Evaluation (Signed)
Anesthesia Post Note  Patient: ANACLETO BATTERMAN  Procedure(s) Performed: UPPER ENDOSCOPIC ULTRASOUND (EUS) LINEAR ESOPHAGOGASTRODUODENOSCOPY (EGD) WITH PROPOFOL FINE NEEDLE ASPIRATION (FNA) LINEAR HOT HEMOSTASIS (ARGON PLASMA COAGULATION/BICAP)     Patient location during evaluation: PACU Anesthesia Type: MAC Level of consciousness: awake Pain management: pain level controlled Vital Signs Assessment: post-procedure vital signs reviewed and stable Respiratory status: spontaneous breathing, nonlabored ventilation and respiratory function stable Cardiovascular status: stable and blood pressure returned to baseline Postop Assessment: no apparent nausea or vomiting Anesthetic complications: no   No notable events documented.  Last Vitals:  Vitals:   06/13/22 1040 06/13/22 1050  BP: (!) 135/93 (!) 141/90  Pulse: 87 82  Resp: 15 16  Temp:    SpO2: 99% 99%    Last Pain:  Vitals:   06/13/22 1050  TempSrc:   PainSc: 0-No pain                 Nilda Simmer

## 2022-06-13 NOTE — Discharge Instructions (Signed)
YOU HAD AN ENDOSCOPIC PROCEDURE TODAY: Refer to the procedure report and other information in the discharge instructions given to you for any specific questions about what was found during the examination. If this information does not answer your questions, please call [place] office at [phone] to clarify.  ° °YOU SHOULD EXPECT: Some feelings of bloating in the abdomen. Passage of more gas than usual. Walking can help get rid of the air that was put into your GI tract during the procedure and reduce the bloating. If you had a lower endoscopy (such as a colonoscopy or flexible sigmoidoscopy) you may notice spotting of blood in your stool or on the toilet paper. Some abdominal soreness may be present for a day or two, also. ° °DIET: Your first meal following the procedure should be a light meal and then it is ok to progress to your normal diet. A half-sandwich or bowl of soup is an example of a good first meal. Heavy or fried foods are harder to digest and may make you feel nauseous or bloated. Drink plenty of fluids but you should avoid alcoholic beverages for 24 hours. If you had a esophageal dilation, please see attached instructions for diet.   ° °ACTIVITY: Your care partner should take you home directly after the procedure. You should plan to take it easy, moving slowly for the rest of the day. You can resume normal activity the day after the procedure however YOU SHOULD NOT DRIVE, use power tools, machinery or perform tasks that involve climbing or major physical exertion for 24 hours (because of the sedation medicines used during the test).  ° °SYMPTOMS TO REPORT IMMEDIATELY: °A gastroenterologist can be reached at any hour. Please call [phone]  for any of the following symptoms:  °Following lower endoscopy (colonoscopy, flexible sigmoidoscopy) °Excessive amounts of blood in the stool  °Significant tenderness, worsening of abdominal pains  °Swelling of the abdomen that is new, acute  °Fever of 100° or  higher  °Following upper endoscopy (EGD, EUS, ERCP, esophageal dilation) °Vomiting of blood or coffee ground material  °New, significant abdominal pain  °New, significant chest pain or pain under the shoulder blades  °Painful or persistently difficult swallowing  °New shortness of breath  °Black, tarry-looking or red, bloody stools ° °FOLLOW UP:  °If any biopsies were taken you will be contacted by phone or by letter within the next 1-3 weeks. Call [phone]  if you have not heard about the biopsies in 3 weeks.  °Please also call with any specific questions about appointments or follow up tests. ° °

## 2022-06-17 ENCOUNTER — Encounter (HOSPITAL_COMMUNITY): Payer: Self-pay | Admitting: Gastroenterology

## 2022-06-17 LAB — CYTOLOGY - NON PAP

## 2022-06-18 ENCOUNTER — Other Ambulatory Visit: Payer: Self-pay

## 2022-06-18 ENCOUNTER — Encounter: Payer: Self-pay | Admitting: Gastroenterology

## 2022-06-18 ENCOUNTER — Telehealth: Payer: Self-pay

## 2022-06-18 DIAGNOSIS — K8689 Other specified diseases of pancreas: Secondary | ICD-10-CM

## 2022-06-18 MED ORDER — PREDNISONE 50 MG PO TABS
50.0000 mg | ORAL_TABLET | ORAL | 0 refills | Status: AC
  Filled 2022-06-18 – 2022-07-10 (×2): qty 3, 1d supply, fill #0

## 2022-06-18 NOTE — Telephone Encounter (Signed)
CT ordered and pt aware that he will need pre meds.  I have mailed the instructions to the pt and he will pick up prescription.  I have sent the order to the schedulers

## 2022-06-18 NOTE — Telephone Encounter (Signed)
Patient has tolerated contrast in the past. OK to perform this with Steroid protocol as per Radiology. Thanks. GM

## 2022-06-18 NOTE — Telephone Encounter (Signed)
Patient needs CT pancreas protocol abdomen in 2 weeks.  We will follow-up fluid analysis when it returns.  Thanks.   Dr Rush Landmark this pt has a contrast allergy.  Can we do without?

## 2022-06-21 ENCOUNTER — Encounter: Payer: Self-pay | Admitting: Gastroenterology

## 2022-06-21 NOTE — Progress Notes (Signed)
Thanks for help everyone. GM

## 2022-06-21 NOTE — Progress Notes (Addendum)
[facility] pancreatic cyst fluid analysis  CEA 2068 ng/mL Amylase 1126 units/L Glucose 338 mg/dL  PancreaGen testing is pending completion at this point.  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  Interestingly his amylase level was elevated and it is similar to when he had his EUS done in January and in July when Dr. Ardis Hughs had done these procedures.  I will see what the CT pancreas protocol scan looks like as we had planned to be done in 2 to 3 weeks and then from there we will discuss his case at [facility].   Justice Britain, MD [facility] # [phone]

## 2022-06-21 NOTE — Progress Notes (Signed)
The pt has been advised he will be contacted in the next few weeks.  The pt has been advised of the information and verbalized understanding.

## 2022-06-25 ENCOUNTER — Other Ambulatory Visit: Payer: Self-pay

## 2022-07-05 NOTE — Progress Notes (Signed)
The CT was cancelled while the pt was hospitalized  I will resend the order today.

## 2022-07-10 ENCOUNTER — Other Ambulatory Visit: Payer: Self-pay | Admitting: Family Medicine

## 2022-07-10 ENCOUNTER — Other Ambulatory Visit: Payer: Self-pay

## 2022-07-10 DIAGNOSIS — R569 Unspecified convulsions: Secondary | ICD-10-CM

## 2022-07-10 MED ORDER — LEVETIRACETAM 500 MG PO TABS
500.0000 mg | ORAL_TABLET | Freq: Two times a day (BID) | ORAL | 3 refills | Status: DC
  Filled 2022-07-10: qty 60, 30d supply, fill #0
  Filled 2022-09-19: qty 60, 30d supply, fill #1
  Filled 2022-11-26: qty 60, 30d supply, fill #2
  Filled 2023-03-05: qty 60, 30d supply, fill #3

## 2022-07-10 NOTE — Progress Notes (Signed)
That sounds great. Thanks. GM

## 2022-07-12 ENCOUNTER — Other Ambulatory Visit: Payer: Self-pay

## 2022-07-12 NOTE — Progress Notes (Signed)
Updated PancreaGEN Fluid Analysis  CEA 2068 ng/mL Amylase 1126 units/L Glucose 338 mg/dL Cytology acellular DNA quantity/quality = moderate quantity and poor quality (degraded) Oncogene point mutations -GNAS = no mutation detected -KRAS = no mutation detected Tumor suppressor genes = no LOH detected  Overall based on the fluid analysis the biological behavior based on PancraGEN seems that this would have nearly 97% probability of benign disease over the next 3 years due to the lack of significant molecular alterations.  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  We will see what his updated pancreas imaging shows at which point we will discuss his case at multidisciplinary conference and come up with a consensus for surveillance versus need for surgical evaluation.  We will have these results scanned into the chart and mailed to the patient for his records.   Justice Britain, MD [facility] # [phone]

## 2022-07-18 ENCOUNTER — Ambulatory Visit (HOSPITAL_COMMUNITY)
Admission: RE | Admit: 2022-07-18 | Discharge: 2022-07-18 | Disposition: A | Discharge status: Home / Self Care | Payer: 59 | Source: Ambulatory Visit | Attending: Gastroenterology | Admitting: Gastroenterology

## 2022-07-18 DIAGNOSIS — K8689 Other specified diseases of pancreas: Secondary | ICD-10-CM | POA: Insufficient documentation

## 2022-07-18 MED ORDER — IOHEXOL 300 MG/ML  SOLN
80.0000 mL | Freq: Once | INTRAMUSCULAR | Status: AC | PRN
  Administered 2022-07-18: 80 mL via INTRAVENOUS

## 2022-07-18 MED ORDER — SODIUM CHLORIDE (PF) 0.9 % IJ SOLN
INTRAMUSCULAR | Status: AC
  Filled 2022-07-18: qty 50

## 2022-07-24 ENCOUNTER — Other Ambulatory Visit: Payer: Self-pay

## 2022-07-24 ENCOUNTER — Encounter: Payer: Self-pay | Admitting: Gastroenterology

## 2022-07-24 NOTE — Progress Notes (Signed)
The proposed treatment discussed in conference is for discussion purpose only and is not a binding recommendation.  The patients have not been physically examined, or presented with their treatment options.  Therefore, final treatment plans cannot be decided.  

## 2022-07-25 NOTE — Progress Notes (Signed)
Patient's case discussed at multidisciplinary conference today. 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  Repeat cyst aspiration may be required again in the future if patient has symptoms (though a pseudocyst gastrostomy could not be performed as this is not a true pseudocyst). The plan was to repeat imaging in 8 weeks and then potentially refer to pancreaticobiliary surgery pending this continues to increase in size. Hopefully if patient is doing well there will be stability of things. 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 No plan for pancreatic ERCP currently.   I will have my team reach out to the patient to let them know the consensus discussion from [facility].   Justice Britain, MD [facility] # [phone]

## 2022-07-26 ENCOUNTER — Telehealth: Payer: Self-pay | Admitting: Gastroenterology

## 2022-07-26 NOTE — Telephone Encounter (Signed)
See alternate phone note dated 07/26/22

## 2022-07-26 NOTE — Progress Notes (Signed)
Left message on machine to call back  

## 2022-07-26 NOTE — Telephone Encounter (Signed)
PT is returning call to get results. Please advise

## 2022-07-26 NOTE — Progress Notes (Signed)
Tried again to reach pt and voice mail is full

## 2022-07-29 ENCOUNTER — Encounter (HOSPITAL_COMMUNITY): Payer: Self-pay | Admitting: Emergency Medicine

## 2022-07-29 ENCOUNTER — Other Ambulatory Visit: Payer: Self-pay

## 2022-07-29 ENCOUNTER — Emergency Department (HOSPITAL_COMMUNITY): Payer: 59

## 2022-07-29 ENCOUNTER — Emergency Department (HOSPITAL_COMMUNITY)
Admission: EM | Admit: 2022-07-29 | Discharge: 2022-07-30 | Disposition: A | Discharge status: Home / Self Care | Payer: 59 | Attending: Emergency Medicine | Admitting: Emergency Medicine

## 2022-07-29 DIAGNOSIS — Z91148 Patient's other noncompliance with medication regimen for other reason: Secondary | ICD-10-CM

## 2022-07-29 DIAGNOSIS — Z87891 Personal history of nicotine dependence: Secondary | ICD-10-CM | POA: Diagnosis not present

## 2022-07-29 DIAGNOSIS — R569 Unspecified convulsions: Secondary | ICD-10-CM | POA: Insufficient documentation

## 2022-07-29 DIAGNOSIS — Z8507 Personal history of malignant neoplasm of pancreas: Secondary | ICD-10-CM | POA: Insufficient documentation

## 2022-07-29 DIAGNOSIS — E119 Type 2 diabetes mellitus without complications: Secondary | ICD-10-CM | POA: Diagnosis not present

## 2022-07-29 DIAGNOSIS — R000 Tachycardia, unspecified: Secondary | ICD-10-CM | POA: Diagnosis not present

## 2022-07-29 DIAGNOSIS — E876 Hypokalemia: Secondary | ICD-10-CM | POA: Diagnosis not present

## 2022-07-29 DIAGNOSIS — Z85038 Personal history of other malignant neoplasm of large intestine: Secondary | ICD-10-CM | POA: Insufficient documentation

## 2022-07-29 DIAGNOSIS — G40919 Epilepsy, unspecified, intractable, without status epilepticus: Secondary | ICD-10-CM

## 2022-07-29 DIAGNOSIS — E7220 Disorder of urea cycle metabolism, unspecified: Secondary | ICD-10-CM | POA: Diagnosis not present

## 2022-07-29 DIAGNOSIS — Z20822 Contact with and (suspected) exposure to covid-19: Secondary | ICD-10-CM | POA: Insufficient documentation

## 2022-07-29 DIAGNOSIS — D649 Anemia, unspecified: Secondary | ICD-10-CM | POA: Diagnosis not present

## 2022-07-29 DIAGNOSIS — E871 Hypo-osmolality and hyponatremia: Secondary | ICD-10-CM | POA: Diagnosis not present

## 2022-07-29 DIAGNOSIS — Z7901 Long term (current) use of anticoagulants: Secondary | ICD-10-CM | POA: Insufficient documentation

## 2022-07-29 DIAGNOSIS — Z7984 Long term (current) use of oral hypoglycemic drugs: Secondary | ICD-10-CM | POA: Insufficient documentation

## 2022-07-29 DIAGNOSIS — I480 Paroxysmal atrial fibrillation: Secondary | ICD-10-CM | POA: Insufficient documentation

## 2022-07-29 DIAGNOSIS — R7401 Elevation of levels of liver transaminase levels: Secondary | ICD-10-CM | POA: Diagnosis not present

## 2022-07-29 DIAGNOSIS — R410 Disorientation, unspecified: Secondary | ICD-10-CM | POA: Diagnosis not present

## 2022-07-29 DIAGNOSIS — R7989 Other specified abnormal findings of blood chemistry: Secondary | ICD-10-CM

## 2022-07-29 DIAGNOSIS — I499 Cardiac arrhythmia, unspecified: Secondary | ICD-10-CM | POA: Diagnosis not present

## 2022-07-29 LAB — RAPID URINE DRUG SCREEN, HOSP PERFORMED
Amphetamines: NOT DETECTED
Barbiturates: NOT DETECTED
Benzodiazepines: NOT DETECTED
Cocaine: NOT DETECTED
Opiates: NOT DETECTED
Tetrahydrocannabinol: NOT DETECTED

## 2022-07-29 LAB — CBC WITH DIFFERENTIAL/PLATELET
Abs Immature Granulocytes: 0.02 10*3/uL (ref 0.00–0.07)
Basophils Absolute: 0 10*3/uL (ref 0.0–0.1)
Basophils Relative: 1 %
Eosinophils Absolute: 0.1 10*3/uL (ref 0.0–0.5)
Eosinophils Relative: 1 %
HCT: 29.7 % — ABNORMAL LOW (ref 39.0–52.0)
Hemoglobin: 10.6 g/dL — ABNORMAL LOW (ref 13.0–17.0)
Immature Granulocytes: 0 %
Lymphocytes Relative: 17 %
Lymphs Abs: 1.1 10*3/uL (ref 0.7–4.0)
MCH: 29.9 pg (ref 26.0–34.0)
MCHC: 35.7 g/dL (ref 30.0–36.0)
MCV: 83.9 fL (ref 80.0–100.0)
Monocytes Absolute: 0.7 10*3/uL (ref 0.1–1.0)
Monocytes Relative: 10 %
Neutro Abs: 4.6 10*3/uL (ref 1.7–7.7)
Neutrophils Relative %: 71 %
Platelets: 70 10*3/uL — ABNORMAL LOW (ref 150–400)
RBC: 3.54 MIL/uL — ABNORMAL LOW (ref 4.22–5.81)
RDW: 22.3 % — ABNORMAL HIGH (ref 11.5–15.5)
WBC: 6.5 10*3/uL (ref 4.0–10.5)
nRBC: 0 % (ref 0.0–0.2)

## 2022-07-29 LAB — URINALYSIS, ROUTINE W REFLEX MICROSCOPIC
Bacteria, UA: NONE SEEN
Bilirubin Urine: NEGATIVE
Glucose, UA: 150 mg/dL — AB
Hgb urine dipstick: NEGATIVE
Ketones, ur: 5 mg/dL — AB
Leukocytes,Ua: NEGATIVE
Nitrite: NEGATIVE
Protein, ur: 100 mg/dL — AB
Specific Gravity, Urine: 1.014 (ref 1.005–1.030)
pH: 6 (ref 5.0–8.0)

## 2022-07-29 LAB — COMPREHENSIVE METABOLIC PANEL
ALT: 34 U/L (ref 0–44)
AST: 81 U/L — ABNORMAL HIGH (ref 15–41)
Albumin: 3.1 g/dL — ABNORMAL LOW (ref 3.5–5.0)
Alkaline Phosphatase: 116 U/L (ref 38–126)
Anion gap: 21 — ABNORMAL HIGH (ref 5–15)
BUN: <5 mg/dL — ABNORMAL LOW (ref 6–20)
CO2: 15 mmol/L — ABNORMAL LOW (ref 22–32)
Calcium: 7.8 mg/dL — ABNORMAL LOW (ref 8.9–10.3)
Chloride: 93 mmol/L — ABNORMAL LOW (ref 98–111)
Creatinine, Ser: 0.68 mg/dL (ref 0.61–1.24)
GFR, Estimated: >60 mL/min (ref >60)
Glucose, Bld: 250 mg/dL — ABNORMAL HIGH (ref 70–99)
Potassium: 3.2 mmol/L — ABNORMAL LOW (ref 3.5–5.1)
Sodium: 129 mmol/L — ABNORMAL LOW (ref 135–145)
Total Bilirubin: 7.4 mg/dL — ABNORMAL HIGH (ref 0.3–1.2)
Total Protein: 6.9 g/dL (ref 6.5–8.1)

## 2022-07-29 LAB — LIPASE, BLOOD: Lipase: 20 U/L (ref 11–51)

## 2022-07-29 LAB — RESP PANEL BY RT-PCR (RSV, FLU A&B, COVID)  RVPGX2
Influenza A by PCR: NEGATIVE
Influenza B by PCR: NEGATIVE
Resp Syncytial Virus by PCR: NEGATIVE
SARS Coronavirus 2 by RT PCR: NEGATIVE

## 2022-07-29 LAB — AMMONIA: Ammonia: 161 umol/L — ABNORMAL HIGH (ref 9–35)

## 2022-07-29 LAB — TROPONIN I (HIGH SENSITIVITY): Troponin I (High Sensitivity): 11 ng/L (ref <18)

## 2022-07-29 LAB — PROTIME-INR
INR: 1.5 — ABNORMAL HIGH (ref 0.8–1.2)
Prothrombin Time: 17.5 seconds — ABNORMAL HIGH (ref 11.4–15.2)

## 2022-07-29 LAB — CK: Total CK: 109 U/L (ref 49–397)

## 2022-07-29 LAB — BRAIN NATRIURETIC PEPTIDE: B Natriuretic Peptide: 40.7 pg/mL (ref 0.0–100.0)

## 2022-07-29 LAB — ETHANOL: Alcohol, Ethyl (B): <10 mg/dL (ref <10)

## 2022-07-29 MED ORDER — SODIUM CHLORIDE 0.9 % IV BOLUS
1000.0000 mL | Freq: Once | INTRAVENOUS | Status: AC
  Administered 2022-07-29: 1000 mL via INTRAVENOUS

## 2022-07-29 MED ORDER — LEVETIRACETAM IN NACL 1500 MG/100ML IV SOLN
1500.0000 mg | Freq: Once | INTRAVENOUS | Status: AC
  Administered 2022-07-29: 1500 mg via INTRAVENOUS
  Filled 2022-07-29: qty 100

## 2022-07-29 NOTE — ED Triage Notes (Signed)
Mother called EMS d/t pt "having a severe seizure".  When [facility] arrived pt was post-ictal.  Pt states he has not had his Meds in several;l days.  EMS also stated his HR was 180 -200 no hx of AFIB. EMS gave 20 mg IV Cardizem and now pt HR 120-150/  Pt also had 750 mL of NS

## 2022-07-29 NOTE — ED Provider Notes (Signed)
[facility] Provider Note  CSN: [id number] Arrival date & time: 07/29/22 2134  Chief Complaint(s) Irregular Heart Beat and Seizures  HPI Gary Mitchell is a 52 y.o. male with past medical history as below, significant for: Hepatitis, cirrhosis, alcohol abuse, DM2, pancreatic neoplasm, singleton kidney, seizures on Keppra who presents to the ED with complaint of seizure, A-fib with RVR.  Patient with history of seizure on Keppra, reports that he lost his Keppra is not taking it for few days.  He had a seizure today witnessed by family.  On EMS arrival patient's heart rate was elevated close 200, A-fib RVR on tracing per EMS, given diltiazem with improvement to his heart rate.  Blood pressure improved IV fluids.  Patient is postictal on EMS arrival, postictal on arrival to the ED.  Poor historian.  Denies any fevers, chills, nausea vomiting or abdominal pain.  Denies any recent alcohol use.  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   Seizures ([place])    Thrombocytopenia ([place])    Patient Active Problem List   Diagnosis Date Noted   Malnutrition of moderate degree 05/08/2022   Gastrointestinal hemorrhage    Benign neoplasm of descending colon    HCAP (healthcare-associated pneumonia) 05/06/2022   Symptomatic anemia 05/05/2022   Hyperlipidemia 05/05/2022   Hyperammonemia ([place]) 05/03/2022   Type 2  diabetes mellitus ([place]) 05/29/2021   Portal hypertensive gastropathy ([place])    Esophageal varices without bleeding ([facility])    Acute blood loss anemia    Seizure disorder ([place]) 50/93/2671   Alcoholic cirrhosis of liver without ascites ([place]) 09/17/2020   Acute hepatic encephalopathy ([place]) 09/17/2020   Hypomagnesemia 09/17/2020   Suspected CVA (cerebral vascular accident) ([place]) 09/17/2020   Hypokalemia 09/17/2020   Seizures ([place]) 07/09/2020   High bilirubin 07/09/2020   Anemia 07/09/2020   Thrombocytopenia ([place]) 07/09/2020   COVID-19 virus infection 07/09/2020   GERD (gastroesophageal reflux disease) 07/09/2020   Pancreatic mass 11/29/2019   Acute pancreatitis 24/58/0998   Alcoholic hepatitis 33/82/5053   Gallstones 11/23/2019   Alcohol abuse 11/23/2019   Home Medication(s) Prior to Admission medications   Medication Sig Start Date End Date Taking? Authorizing Provider  apixaban (ELIQUIS) 5 MG TABS tablet Take 1 tablet (5 mg total) by mouth 2 (two) times daily. 07/30/22 08/29/22 Yes Jeanell Sparrow, DO  folic acid (FOLVITE) 1 MG tablet Take 1 tablet (1 mg total) by mouth daily. 05/13/22  Yes Hosie Poisson, MD  gabapentin (NEURONTIN) 300 MG capsule Take 1 capsule (300 mg total) by mouth at bedtime. 11/20/21  Yes Charlott Rakes, MD  levETIRAcetam (KEPPRA) 500 MG tablet Take 1 tablet (500 mg total) by mouth 2 (two) times daily. 07/30/22 09/28/22 Yes Wynona Dove A, DO  potassium chloride SA (KLOR-CON M) 20 MEQ tablet Take 1 tablet (20 mEq total) by mouth 2 (two) times daily. 9/76/73  Yes Roxanne Mins,  Shanon Brow, MD  atorvastatin (LIPITOR) 20 MG tablet Take 1 tablet (20 mg total) by mouth daily. Patient not taking: Reported on 07/29/2022 05/29/21   Charlott Rakes, MD  lactulose (CHRONULAC) 10 GM/15ML solution Take 30 mLs (20 g total) by mouth 3 (three) times daily. 05/12/22   Hosie Poisson, MD  levETIRAcetam (KEPPRA) 500 MG tablet Take 500 mg by mouth 2 (two) times daily.    [provider]   levETIRAcetam (KEPPRA) 500 MG tablet Take 1 tablet (500 mg total) by mouth 2 (two) times daily. 07/10/22 10/08/22  Charlott Rakes, MD  metFORMIN (GLUCOPHAGE) 500 MG tablet Take 2 tablets (1,000 mg total) by mouth 2 (two) times daily with a meal. 11/20/21   Charlott Rakes, MD  Multiple Vitamin (MULTIVITAMIN) tablet Take 1 tablet by mouth at bedtime.    [provider]  naltrexone (DEPADE) 50 MG tablet Take 1 tablet (50 mg total) by mouth daily. 10/03/20   Charlott Rakes, MD  pantoprazole (PROTONIX) 40 MG tablet Take 1 tablet (40 mg total) by mouth 2 (two) times daily. Patient taking differently: Take 40 mg by mouth daily. 05/12/22   Hosie Poisson, MD  sucralfate (CARAFATE) 1 GM/10ML suspension Take 10 mLs (1 g total) by mouth 4 (four) times daily -  with meals and at bedtime. 05/12/22   Hosie Poisson, MD  thiamine (VITAMIN B-1) 100 MG tablet Take 1 tablet (100 mg total) by mouth daily. 05/13/22   Hosie Poisson, MD  Thiamine HCl (B-1) 500 MG TABS Take 250 mg by mouth daily.    [provider]                                                                                                                                    Past Surgical History Past Surgical History:  Procedure Laterality Date   BIOPSY  08/02/2021   Procedure: BIOPSY;  Surgeon: Milus Banister, MD;  Location: [facility];  Service: Endoscopy;;   BIOPSY  05/07/2022   Procedure: BIOPSY;  Surgeon: [name], MD;  Location: [facility];  Service: Gastroenterology;;   COLONOSCOPY WITH PROPOFOL N/A 05/08/2022   Procedure: COLONOSCOPY WITH PROPOFOL;  Surgeon: [name], MD;  Location: [facility];  Service: Gastroenterology;  Laterality: N/A;   ENTEROSCOPY N/A 09/20/2020   Procedure: ENTEROSCOPY;  Surgeon: Thornton Park, MD;  Location: [facility];  Service: Gastroenterology;  Laterality: N/A;   ENTEROSCOPY N/A 05/11/2022   Procedure: ENTEROSCOPY;  Surgeon: [name], MD;  Location: [facility];  Service: Gastroenterology;  Laterality: N/A;   ESOPHAGOGASTRODUODENOSCOPY (EGD) WITH PROPOFOL N/A 08/02/2021   Procedure: ESOPHAGOGASTRODUODENOSCOPY (EGD) WITH PROPOFOL;  Surgeon: Milus Banister, MD;  [facility];  Service: Endoscopy;  Laterality: N/A;   ESOPHAGOGASTRODUODENOSCOPY (EGD) WITH PROPOFOL N/A 01/31/2022   Procedure: ESOPHAGOGASTRODUODENOSCOPY (EGD) WITH PROPOFOL;  Surgeon: Milus Banister, MD;  [facility];  Service: Gastroenterology;  Laterality: N/A;   ESOPHAGOGASTRODUODENOSCOPY (EGD)  WITH PROPOFOL N/A 05/07/2022   Procedure: ESOPHAGOGASTRODUODENOSCOPY (EGD) WITH PROPOFOL;  Surgeon: [name], MD;  [facility];  Service: Gastroenterology;  Laterality: N/A;   ESOPHAGOGASTRODUODENOSCOPY (EGD) WITH PROPOFOL N/A 06/13/2022   Procedure: ESOPHAGOGASTRODUODENOSCOPY (EGD) WITH PROPOFOL;  Surgeon: Rush Landmark Telford Nab., MD;  [facility];  Service: Gastroenterology;  Laterality: N/A;   EUS N/A 01/31/2022   Procedure: UPPER ENDOSCOPIC ULTRASOUND (EUS) RADIAL;  Surgeon: Milus Banister, MD;  [facility];  Service: Gastroenterology;  Laterality: N/A;   EUS N/A 06/13/2022   Procedure: UPPER ENDOSCOPIC ULTRASOUND (EUS) LINEAR;  Surgeon: Irving Copas., MD;  [facility];  Service: Gastroenterology;  Laterality: N/A;   FINE NEEDLE ASPIRATION N/A 08/02/2021   Procedure: FINE NEEDLE ASPIRATION (FNA) LINEAR;  Surgeon: Milus Banister, MD;  [facility];  Service: Endoscopy;  Laterality: N/A;   FINE NEEDLE ASPIRATION N/A 01/31/2022   Procedure: FINE NEEDLE ASPIRATION (FNA) LINEAR;  Surgeon: Milus Banister, MD;  [facility];  Service: Gastroenterology;  Laterality: N/A;   FINE NEEDLE ASPIRATION N/A 06/13/2022   Procedure: FINE NEEDLE ASPIRATION (FNA) LINEAR;  Surgeon: Irving Copas., MD;  [facility];  Service: Gastroenterology;  Laterality: N/A;   GIVENS CAPSULE STUDY  N/A 05/09/2022   Procedure: GIVENS CAPSULE STUDY;  Surgeon: [name], MD;  [facility];  Service: Gastroenterology;  Laterality: N/A;   HEMOSTASIS CLIP PLACEMENT  05/08/2022   Procedure: HEMOSTASIS CLIP PLACEMENT;  Surgeon: [name], MD;  Location: [facility];  Service: Gastroenterology;;   HEMOSTASIS CLIP PLACEMENT  05/11/2022   Procedure: HEMOSTASIS CLIP PLACEMENT;  Surgeon: [name], MD;  [facility];  Service: Gastroenterology;;   HOT HEMOSTASIS N/A 06/13/2022   Procedure: HOT HEMOSTASIS (ARGON PLASMA COAGULATION/BICAP);  Surgeon: Irving Copas., MD;  Location: [facility];  Service: Gastroenterology;  Laterality: N/A;   left kidney removed     POLYPECTOMY  05/08/2022   Procedure: POLYPECTOMY;  Surgeon: [name], MD;  Location: [facility];  Service: Gastroenterology;;   UPPER ESOPHAGEAL ENDOSCOPIC ULTRASOUND (EUS) N/A 08/02/2021   Procedure: UPPER ESOPHAGEAL ENDOSCOPIC ULTRASOUND (EUS);  Surgeon: Milus Banister, MD;  Location: [facility];  Service: Endoscopy;  Laterality: N/A;  Radial and Linear   Family History Family History  Problem Relation Age of Onset   Diabetes Mellitus II Mother    Colon cancer Neg Hx    Esophageal cancer Neg Hx     Social History Social History   Tobacco Use   Smoking status: Former    Packs/day: 0.10    Years: 25.00    Total pack years: 2.50    Types: Cigarettes    Quit date: 2019    Years since quitting: 5.0   Smokeless tobacco: Never  Vaping Use   Vaping Use: Never used  Substance Use Topics   Alcohol use: Not Currently    Alcohol/week: 3.0 standard drinks of alcohol    Types: 3 Cans of beer per week   Drug use: Not Currently    Types: Marijuana   Allergies Iohexol  Review of Systems Review of Systems  Constitutional:  Negative for chills and fever.  HENT:  Negative for facial swelling and trouble swallowing.   Eyes:  Negative for photophobia and visual  disturbance.  Respiratory:  Negative for cough and shortness of breath.   Cardiovascular:  Positive for palpitations. Negative for chest pain.  Gastrointestinal:  Negative for abdominal pain, nausea and vomiting.  Endocrine: Negative for  polydipsia and polyuria.  Genitourinary:  Negative for difficulty urinating and hematuria.  Musculoskeletal:  Negative for gait problem and joint swelling.  Skin:  Negative for pallor and rash.  Neurological:  Positive for seizures. Negative for syncope and headaches.  Psychiatric/Behavioral:  Negative for agitation and confusion.     Physical Exam Vital Signs  I have reviewed the triage vital signs BP 125/80   Pulse 98   Temp 98.5 F (36.9 C) (Oral)   Resp 19   Ht '5\' 4"'$  (1.626 m)   Wt 65.8 kg   SpO2 100%   BMI 24.89 kg/m  Physical Exam Vitals and nursing note reviewed.  Constitutional:      General: He is not in acute distress.    Appearance: Normal appearance. He is well-developed.  HENT:     Head: Normocephalic and atraumatic. No raccoon eyes, Battle's sign, right periorbital erythema or left periorbital erythema.     Jaw: There is normal jaw occlusion. No trismus.     Right Ear: External ear normal.     Left Ear: External ear normal.     Mouth/Throat:     Mouth: Mucous membranes are moist.  Eyes:     General: Scleral icterus present.     Extraocular Movements: Extraocular movements intact.     Pupils: Pupils are equal, round, and reactive to light.  Cardiovascular:     Rate and Rhythm: Normal rate and regular rhythm.     Pulses: Normal pulses.     Heart sounds: Normal heart sounds.  Pulmonary:     Effort: Pulmonary effort is normal. No respiratory distress.     Breath sounds: Normal breath sounds.  Abdominal:     General: Abdomen is flat.     Palpations: Abdomen is soft.     Tenderness: There is no abdominal tenderness. There is no guarding or rebound.  Musculoskeletal:        General: Normal range of motion.     Cervical  back: Normal range of motion.     Right lower leg: No edema.     Left lower leg: No edema.  Skin:    General: Skin is warm and dry.     Capillary Refill: Capillary refill takes less than 2 seconds.  Neurological:     Mental Status: He is alert and oriented to person, place, and time.     GCS: GCS eye subscore is 4. GCS verbal subscore is 5. GCS motor subscore is 6.     Cranial Nerves: Cranial nerves 2-12 are intact.     Sensory: Sensation is intact.     Motor: Motor function is intact.     Coordination: Coordination is intact.  Psychiatric:        Mood and Affect: Mood normal.        Behavior: Behavior normal.     ED Results and Treatments Labs (all labs ordered are listed, but only abnormal results are displayed) Labs Reviewed  AMMONIA - Abnormal; Notable for the following components:      Result Value   Ammonia 161 (*)    All other components within normal limits  CBC WITH DIFFERENTIAL/PLATELET - Abnormal; Notable for the following components:   RBC 3.54 (*)    Hemoglobin 10.6 (*)    HCT 29.7 (*)    RDW 22.3 (*)    Platelets 70 (*)    All other components within normal limits  COMPREHENSIVE METABOLIC PANEL - Abnormal; Notable for the following components:   Sodium  129 (*)    Potassium 3.2 (*)    Chloride 93 (*)    CO2 15 (*)    Glucose, Bld 250 (*)    BUN <5 (*)    Calcium 7.8 (*)    Albumin 3.1 (*)    AST 81 (*)    Total Bilirubin 7.4 (*)    Anion gap 21 (*)    All other components within normal limits  URINALYSIS, ROUTINE W REFLEX MICROSCOPIC - Abnormal; Notable for the following components:   Color, Urine AMBER (*)    Glucose, UA 150 (*)    Ketones, ur 5 (*)    Protein, ur 100 (*)    All other components within normal limits  PROTIME-INR - Abnormal; Notable for the following components:   Prothrombin Time 17.5 (*)    INR 1.5 (*)    All other components within normal limits  TROPONIN I (HIGH SENSITIVITY) - Abnormal; Notable for the following components:    Troponin I (High Sensitivity) 21 (*)    All other components within normal limits  RESP PANEL BY RT-PCR (RSV, FLU A&B, COVID)  RVPGX2  LIPASE, BLOOD  ETHANOL  RAPID URINE DRUG SCREEN, HOSP PERFORMED  CK  BRAIN NATRIURETIC PEPTIDE  TROPONIN I (HIGH SENSITIVITY)                                                                                                                          Radiology CT Head Wo Contrast  Result Date: 07/29/2022 CLINICAL DATA:  Seizure EXAM: CT HEAD WITHOUT CONTRAST TECHNIQUE: Contiguous axial images were obtained from the base of the skull through the vertex without intravenous contrast. RADIATION DOSE REDUCTION: This exam was performed according to the departmental dose-optimization program which includes automated exposure control, adjustment of the mA and/or kV according to patient size and/or use of iterative reconstruction technique. COMPARISON:  CT brain 05/15/2022 FINDINGS: Brain: No acute territorial infarction, hemorrhage, or intracranial mass. Atrophy. Patchy white matter hypodensity, likely chronic small vessel ischemic change. The ventricles are nonenlarged Vascular: No hyperdense vessels.  No unexpected calcification Skull: Normal. Negative for fracture or focal lesion. Sinuses/Orbits: No acute finding. Other: None IMPRESSION: 1. No CT evidence for acute intracranial abnormality. 2. Atrophy and mild chronic small vessel ischemic changes of the white matter. Electronically Signed   By: Donavan Foil M.D.   On: 07/29/2022 23:41   DG Chest Portable 1 View  Result Date: 07/29/2022 CLINICAL DATA:  AFib with rapid ventricular response. EXAM: PORTABLE CHEST 1 VIEW COMPARISON:  PA Lat 05/03/2022. FINDINGS: The heart is upper limits of normal in size. There is increased central vascular prominence without overt edema. The mediastinum is normally outlined. The lungs are clear. There are chronic healed fracture deformities of the posterior left sixth and seventh ribs. Mild  thoracic dextroscoliosis. IMPRESSION: 1. Increased central vascular prominence without overt edema, with borderline prominence of the heart. 2. No focal airspace disease. Electronically Signed   By: Ninfa Linden.D.  On: 07/29/2022 22:14    Pertinent labs & imaging results that were available during my care of the patient were reviewed by me and considered in my medical decision making (see MDM for details).  Medications Ordered in ED Medications  potassium chloride SA (KLOR-CON M) CR tablet 40 mEq (has no administration in time range)  levETIRAcetam (KEPPRA) IVPB 1500 mg/ 100 mL premix (0 mg Intravenous Stopped 07/29/22 2305)  sodium chloride 0.9 % bolus 1,000 mL (0 mLs Intravenous Stopped 07/29/22 2305)                                                                                                                                     Procedures .Critical Care  Performed by: Jeanell Sparrow, DO Authorized by: Jeanell Sparrow, DO   Critical care provider statement:    Critical care time (minutes):  30   Critical care time was exclusive of:  Separately billable procedures and treating other patients   Critical care was necessary to treat or prevent imminent or life-threatening deterioration of the following conditions:  CNS failure or compromise and cardiac failure   Critical care was time spent personally by me on the following activities:  Development of treatment plan with patient or surrogate, discussions with consultants, evaluation of patient's response to treatment, examination of patient, ordering and review of laboratory studies, ordering and review of radiographic studies, ordering and performing treatments and interventions, pulse oximetry, re-evaluation of patient's condition and review of old charts   (including critical care time)  Medical Decision Making / ED Course   MDM:  ELIUS ETHEREDGE is a 52 y.o. male with past medical history as below, significant for: Hepatitis,  cirrhosis, alcohol abuse, DM2, pancreatic neoplasm, singleton kidney, seizures on Keppra who presents to the ED with complaint of seizure, A-fib with RVR.Marland Kitchen The complaint involves an extensive differential diagnosis and also carries with it a high risk of complications and morbidity.  Serious etiology was considered. Ddx includes but is not limited to: Differential diagnoses for altered mental status includes but is not exclusive to alcohol, illicit or prescription medications, intracranial pathology such as stroke, intracerebral hemorrhage, fever or infectious causes including sepsis, hypoxemia, uremia, trauma, endocrine related disorders such as diabetes, hypoglycemia, thyroid-related diseases, etc.    On initial assessment the patient is: Resting comfortably, heart rate is elevated 130, appears to be A-fib on telemetry.  Not hypoxic.  Blood pressure stable Vital signs and nursing notes were reviewed  Clinical Course as of 07/30/22 0107  Mon Jul 29, 2022  2252 CHA2DS2VASc score is 3 [SG]  2252 AFIB rvr has resolved, was given dilt by ems; now in NSR [SG]    Clinical Course User Index [SG] Jeanell Sparrow, DO  Labs reviewed, he has hyponatremia, hypokalemia, bicarb is low.  Creatinine stable.  Calcium is also low.  BUN is very low.  Bili is 7.  Labs are similar  to his baseline. Ammonia is elevated at 161, he does have scleral icterus, cirrhosis, liver disease, likely pancreatic cancer.  Months ago was over 100 RVP was negative, CPK was 109 ETOH was undetectable CXR wnl CTH negative Patient apparent new onset A-fib, will give referral to A-fib clinic.  No history in the past and I am able to see on chart review.  CHA2DS2-VASc is 3, start Eliquis. Status is back to baseline per mother at bedside. Patient does need refill on his Keppra, he has variable compliance.  Stressed importance of adherence to medication regimen. He also has lactulose at home, also stressed importance of taking his  lactulose.  Patient signed out to incoming EDP pending delta troponin.  Patient is currently back to his baseline, mother will take him home at time of discharge   Additional history obtained: -Additional history obtained from family and ems -External records from outside source obtained and reviewed including: Chart review including previous notes, labs, imaging, consultation notes including prior admission, primary care documentation, prior ED visits, prior labs and imaging, home medications   Lab Tests: -I ordered, reviewed, and interpreted labs.   The pertinent results include:   Labs Reviewed  AMMONIA - Abnormal; Notable for the following components:      Result Value   Ammonia 161 (*)    All other components within normal limits  CBC WITH DIFFERENTIAL/PLATELET - Abnormal; Notable for the following components:   RBC 3.54 (*)    Hemoglobin 10.6 (*)    HCT 29.7 (*)    RDW 22.3 (*)    Platelets 70 (*)    All other components within normal limits  COMPREHENSIVE METABOLIC PANEL - Abnormal; Notable for the following components:   Sodium 129 (*)    Potassium 3.2 (*)    Chloride 93 (*)    CO2 15 (*)    Glucose, Bld 250 (*)    BUN <5 (*)    Calcium 7.8 (*)    Albumin 3.1 (*)    AST 81 (*)    Total Bilirubin 7.4 (*)    Anion gap 21 (*)    All other components within normal limits  URINALYSIS, ROUTINE W REFLEX MICROSCOPIC - Abnormal; Notable for the following components:   Color, Urine AMBER (*)    Glucose, UA 150 (*)    Ketones, ur 5 (*)    Protein, ur 100 (*)    All other components within normal limits  PROTIME-INR - Abnormal; Notable for the following components:   Prothrombin Time 17.5 (*)    INR 1.5 (*)    All other components within normal limits  TROPONIN I (HIGH SENSITIVITY) - Abnormal; Notable for the following components:   Troponin I (High Sensitivity) 21 (*)    All other components within normal limits  RESP PANEL BY RT-PCR (RSV, FLU A&B, COVID)  RVPGX2   LIPASE, BLOOD  ETHANOL  RAPID URINE DRUG SCREEN, HOSP PERFORMED  CK  BRAIN NATRIURETIC PEPTIDE  TROPONIN I (HIGH SENSITIVITY)    Notable for as above  EKG   EKG Interpretation  Date/Time:  Monday July 29 2022 23:15:21 EST Ventricular Rate:  99 PR Interval:  169 QRS Duration: 92 QT Interval:  397 QTC Calculation: 510 R Axis:   64 Text Interpretation: Sinus rhythm Prolonged QT interval When compared with ECG of EARLIER SAME DATE Sinus rhythm has replaced Atrial fibrillation with rapid ventricular response HEART RATE has changed Confirmed by Delora Fuel ([id number]) on 07/30/2022 12:26:04 AM  Imaging Studies ordered: I ordered imaging studies including CT head, chest x-ray I independently visualized the following imaging with scope of interpretation limited to determining acute life threatening conditions related to emergency care: As above I independently visualized and interpreted imaging. I agree with the radiologist interpretation   Medicines ordered and prescription drug management: Meds ordered this encounter  Medications   levETIRAcetam (KEPPRA) IVPB 1500 mg/ 100 mL premix   sodium chloride 0.9 % bolus 1,000 mL   levETIRAcetam (KEPPRA) 500 MG tablet    Sig: Take 1 tablet (500 mg total) by mouth 2 (two) times daily.    Dispense:  60 tablet    Refill:  1   apixaban (ELIQUIS) 5 MG TABS tablet    Sig: Take 1 tablet (5 mg total) by mouth 2 (two) times daily.    Dispense:  60 tablet    Refill:  0   potassium chloride SA (KLOR-CON M) CR tablet 40 mEq   potassium chloride SA (KLOR-CON M) 20 MEQ tablet    Sig: Take 1 tablet (20 mEq total) by mouth 2 (two) times daily.    Dispense:  20 tablet    Refill:  0    -I have reviewed the patients home medicines and have made adjustments as needed   Consultations Obtained: Not applicable  Cardiac Monitoring: The patient was maintained on a cardiac monitor.  I personally viewed and interpreted the cardiac monitored  which showed an underlying rhythm of: A-fib with RVR then converted to NSR  Social Determinants of Health:  Diagnosis or treatment significantly limited by social determinants of health: former smoker and alcohol use   Reevaluation: After the interventions noted above, I reevaluated the patient and found that they have improved  Co morbidities that complicate the patient evaluation  Past Medical History:  Diagnosis Date   Alcoholic hepatitis 36/6440   Anemia    Cirrhosis with alcoholism ([place]) 11/2019   Coagulopathy ([place])    Attributed to liver disease/cirrhosis   Diabetes mellitus without complication ([place])    type 2   Neuromuscular disorder ([place])    neuropathy  feet legs   Pancreatic lesion 11/2019   Initially concerning for neoplasm but improved appearance on MRI 12/2019 at which time pseudocyst was most likely diagnosis.   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   Seizures ([place])    Thrombocytopenia ([place])       Dispostion: Disposition decision including need for hospitalization was considered, and patient discharged from emergency department.    Final Clinical Impression(s) / ED Diagnoses Final diagnoses:  Paroxysmal atrial fibrillation (HCC)  Increased ammonia level  Breakthrough seizure (HCC)  Noncompliance with medication regimen  Elevated troponin  Hyponatremia  Hypokalemia  Elevated AST (SGOT)  Normochromic normocytic anemia     This chart was dictated using voice recognition software.  Despite best efforts to proofread,  errors can occur which can change the documentation meaning.    Jeanell Sparrow, DO 07/30/22 0107

## 2022-07-29 NOTE — Progress Notes (Signed)
Left message on machine to call back  

## 2022-07-30 ENCOUNTER — Other Ambulatory Visit: Payer: Self-pay

## 2022-07-30 LAB — TROPONIN I (HIGH SENSITIVITY): Troponin I (High Sensitivity): 21 ng/L — ABNORMAL HIGH (ref <18)

## 2022-07-30 MED ORDER — POTASSIUM CHLORIDE CRYS ER 20 MEQ PO TBCR
20.0000 meq | EXTENDED_RELEASE_TABLET | Freq: Two times a day (BID) | ORAL | 0 refills | Status: DC
  Filled 2022-07-30: qty 20, 10d supply, fill #0

## 2022-07-30 MED ORDER — POTASSIUM CHLORIDE CRYS ER 20 MEQ PO TBCR
40.0000 meq | EXTENDED_RELEASE_TABLET | Freq: Once | ORAL | Status: AC
  Administered 2022-07-30: 40 meq via ORAL
  Filled 2022-07-30: qty 2

## 2022-07-30 MED ORDER — APIXABAN 5 MG PO TABS
5.0000 mg | ORAL_TABLET | Freq: Two times a day (BID) | ORAL | 0 refills | Status: DC
  Filled 2022-07-30 (×2): qty 60, 30d supply, fill #0

## 2022-07-30 MED ORDER — LEVETIRACETAM 500 MG PO TABS
500.0000 mg | ORAL_TABLET | Freq: Two times a day (BID) | ORAL | 1 refills | Status: DC
  Filled 2022-07-30: qty 60, 30d supply, fill #0

## 2022-07-30 NOTE — ED Provider Notes (Signed)
Care assumed from Dr. Pearline Cables, patient pending repeat troponin.  Repeat troponin has increased slightly, felt to represent demand ischemia from atrial fibrillation with rapid ventricular response and not ACS.  He is still felt to be safe for discharge.  Results for orders placed or performed during the hospital encounter of 07/29/22  Resp panel by RT-PCR (RSV, Flu A&B, Covid) Anterior Nasal Swab   Specimen: Anterior Nasal Swab  Result Value Ref Range   SARS Coronavirus 2 by RT PCR NEGATIVE NEGATIVE   Influenza A by PCR NEGATIVE NEGATIVE   Influenza B by PCR NEGATIVE NEGATIVE   Resp Syncytial Virus by PCR NEGATIVE NEGATIVE  Ammonia  Result Value Ref Range   Ammonia 161 (H) 9 - 35 umol/L  CBC with Differential  Result Value Ref Range   WBC 6.5 4.0 - 10.5 K/uL   RBC 3.54 (L) 4.22 - 5.81 MIL/uL   Hemoglobin 10.6 (L) 13.0 - 17.0 g/dL   HCT 29.7 (L) 39.0 - 52.0 %   MCV 83.9 80.0 - 100.0 fL   MCH 29.9 26.0 - 34.0 pg   MCHC 35.7 30.0 - 36.0 g/dL   RDW 22.3 (H) 11.5 - 15.5 %   Platelets 70 (L) 150 - 400 K/uL   nRBC 0.0 0.0 - 0.2 %   Neutrophils Relative % 71 %   Neutro Abs 4.6 1.7 - 7.7 K/uL   Lymphocytes Relative 17 %   Lymphs Abs 1.1 0.7 - 4.0 K/uL   Monocytes Relative 10 %   Monocytes Absolute 0.7 0.1 - 1.0 K/uL   Eosinophils Relative 1 %   Eosinophils Absolute 0.1 0.0 - 0.5 K/uL   Basophils Relative 1 %   Basophils Absolute 0.0 0.0 - 0.1 K/uL   Immature Granulocytes 0 %   Abs Immature Granulocytes 0.02 0.00 - 0.07 K/uL   Target Cells PRESENT   Comprehensive metabolic panel  Result Value Ref Range   Sodium 129 (L) 135 - 145 mmol/L   Potassium 3.2 (L) 3.5 - 5.1 mmol/L   Chloride 93 (L) 98 - 111 mmol/L   CO2 15 (L) 22 - 32 mmol/L   Glucose, Bld 250 (H) 70 - 99 mg/dL   BUN <5 (L) 6 - 20 mg/dL   Creatinine, Ser 0.68 0.61 - 1.24 mg/dL   Calcium 7.8 (L) 8.9 - 10.3 mg/dL   Total Protein 6.9 6.5 - 8.1 g/dL   Albumin 3.1 (L) 3.5 - 5.0 g/dL   AST 81 (H) 15 - 41 U/L   ALT 34 0 - 44  U/L   Alkaline Phosphatase 116 38 - 126 U/L   Total Bilirubin 7.4 (H) 0.3 - 1.2 mg/dL   GFR, Estimated >60 >60 mL/min   Anion gap 21 (H) 5 - 15  Lipase, blood  Result Value Ref Range   Lipase 20 11 - 51 U/L  Urinalysis, Routine w reflex microscopic Urine, Clean Catch  Result Value Ref Range   Color, Urine AMBER (A) YELLOW   APPearance CLEAR CLEAR   Specific Gravity, Urine 1.014 1.005 - 1.030   pH 6.0 5.0 - 8.0   Glucose, UA 150 (A) NEGATIVE mg/dL   Hgb urine dipstick NEGATIVE NEGATIVE   Bilirubin Urine NEGATIVE NEGATIVE   Ketones, ur 5 (A) NEGATIVE mg/dL   Protein, ur 100 (A) NEGATIVE mg/dL   Nitrite NEGATIVE NEGATIVE   Leukocytes,Ua NEGATIVE NEGATIVE   RBC / HPF 0-5 0 - 5 RBC/hpf   WBC, UA 0-5 0 - 5 WBC/hpf   Bacteria, UA  NONE SEEN NONE SEEN   Squamous Epithelial / HPF 0-5 0 - 5 /HPF   Mucus PRESENT    Hyaline Casts, UA PRESENT   Ethanol  Result Value Ref Range   Alcohol, Ethyl (B) <10 <10 mg/dL  Rapid urine drug screen (hospital performed)  Result Value Ref Range   Opiates NONE DETECTED NONE DETECTED   Cocaine NONE DETECTED NONE DETECTED   Benzodiazepines NONE DETECTED NONE DETECTED   Amphetamines NONE DETECTED NONE DETECTED   Tetrahydrocannabinol NONE DETECTED NONE DETECTED   Barbiturates NONE DETECTED NONE DETECTED  Protime-INR  Result Value Ref Range   Prothrombin Time 17.5 (H) 11.4 - 15.2 seconds   INR 1.5 (H) 0.8 - 1.2  CK  Result Value Ref Range   Total CK 109 49 - 397 U/L  Brain natriuretic peptide  Result Value Ref Range   B Natriuretic Peptide 40.7 0.0 - 100.0 pg/mL  Troponin I (High Sensitivity)  Result Value Ref Range   Troponin I (High Sensitivity) 11 <18 ng/L  Troponin I (High Sensitivity)  Result Value Ref Range   Troponin I (High Sensitivity) 21 (H) <18 ng/L   CT Head Wo Contrast  Result Date: 07/29/2022 CLINICAL DATA:  Seizure EXAM: CT HEAD WITHOUT CONTRAST TECHNIQUE: Contiguous axial images were obtained from the base of the skull through  the vertex without intravenous contrast. RADIATION DOSE REDUCTION: This exam was performed according to the departmental dose-optimization program which includes automated exposure control, adjustment of the mA and/or kV according to patient size and/or use of iterative reconstruction technique. COMPARISON:  CT brain 05/15/2022 FINDINGS: Brain: No acute territorial infarction, hemorrhage, or intracranial mass. Atrophy. Patchy white matter hypodensity, likely chronic small vessel ischemic change. The ventricles are nonenlarged Vascular: No hyperdense vessels.  No unexpected calcification Skull: Normal. Negative for fracture or focal lesion. Sinuses/Orbits: No acute finding. Other: None IMPRESSION: 1. No CT evidence for acute intracranial abnormality. 2. Atrophy and mild chronic small vessel ischemic changes of the white matter. Electronically Signed   By: Donavan Foil M.D.   On: 07/29/2022 23:41   DG Chest Portable 1 View  Result Date: 07/29/2022 CLINICAL DATA:  AFib with rapid ventricular response. EXAM: PORTABLE CHEST 1 VIEW COMPARISON:  PA Lat 05/03/2022. FINDINGS: The heart is upper limits of normal in size. There is increased central vascular prominence without overt edema. The mediastinum is normally outlined. The lungs are clear. There are chronic healed fracture deformities of the posterior left sixth and seventh ribs. Mild thoracic dextroscoliosis. IMPRESSION: 1. Increased central vascular prominence without overt edema, with borderline prominence of the heart. 2. No focal airspace disease. Electronically Signed   By: Telford Nab M.D.   On: 07/29/2022 22:14   CT ABDOMEN W WO CONTRAST  Result Date: 07/18/2022 CLINICAL DATA:  Follow-up pancreatic cystic lesion EXAM: CT ABDOMEN WITHOUT AND WITH CONTRAST TECHNIQUE: Multidetector CT imaging of the abdomen was performed following the standard protocol before and following the bolus administration of intravenous contrast. RADIATION DOSE REDUCTION: This  exam was performed according to the departmental dose-optimization program which includes automated exposure control, adjustment of the mA and/or kV according to patient size and/or use of iterative reconstruction technique. CONTRAST:  52m OMNIPAQUE IOHEXOL 300 MG/ML  SOLN COMPARISON:  CT abdomen and pelvis dated 05/09/2022 FINDINGS: Lower chest: No focal consolidation or pulmonary nodule in the lung bases. No pleural effusion or pneumothorax demonstrated. Left atrial enlargement. Hepatobiliary: Cirrhotic morphology. No intra or extrahepatic biliary ductal dilation. Cholelithiasis. Mild diffuse gallbladder wall thickening  is likely related to underdistention. Pancreas: Increase in size of cystic lesion in the pancreatic body/tail measuring 4.7 x 4.1 x 5.2 cm (4:46, 13:35), previously 3.7 x 3.3 x 4.3 cm (remeasured). Upstream pancreatic ductal dilation is again seen, up to 8 mm, associated with pancreatic parenchymal atrophy. Previously noted nodular area adjacent to the cystic focus is not well seen. Spleen: Normal in size without focal abnormality. Adrenals/Urinary Tract: No adrenal nodules. Left kidney is absent. No suspicious renal masses, calculi or hydronephrosis. Stomach/Bowel: Normal appearance of the stomach. Endoscopy clip in the duodenal. No evidence of bowel wall thickening, distention, or inflammatory changes. Vascular/Lymphatic: Aortic atherosclerosis. Recanalized paraumbilical vein with anterior abdominal subcutaneous varices. No enlarged abdominal lymph nodes. Other: No free fluid, fluid collection, or free air. Musculoskeletal: No acute or abnormal lytic or blastic osseous lesions. IMPRESSION: 1. Increase in size of cystic lesion in the pancreatic body/tail measuring up to 5.2 cm, previously 4.3 cm. Similar upstream pancreatic ductal dilation. 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 2. 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 3. Aortic Atherosclerosis (ICD10-I70.0). Electronically Signed   By: Darrin Nipper M.D.   On: 24/03/7352 29:92      Delora Fuel, MD 42/68/34 [phone]

## 2022-07-30 NOTE — Discharge Instructions (Signed)
It was a pleasure caring for you today in the emergency department. ° °Please return to the emergency department for any worsening or worrisome symptoms. ° ° °

## 2022-07-30 NOTE — Progress Notes (Signed)
I have tried to reach the pt by phone with no response.  I will mail a letter to have them contact our office to discuss.

## 2022-07-31 ENCOUNTER — Other Ambulatory Visit: Payer: Self-pay

## 2022-08-05 ENCOUNTER — Encounter: Payer: Self-pay | Admitting: Family Medicine

## 2022-08-05 ENCOUNTER — Other Ambulatory Visit: Payer: Self-pay

## 2022-08-05 ENCOUNTER — Ambulatory Visit: Payer: 59 | Attending: Family Medicine | Admitting: Family Medicine

## 2022-08-05 VITALS — BP 148/89 | HR 93 | Temp 98.1°F | Ht 64.0 in | Wt 130.4 lb

## 2022-08-05 DIAGNOSIS — E1169 Type 2 diabetes mellitus with other specified complication: Secondary | ICD-10-CM

## 2022-08-05 DIAGNOSIS — K8689 Other specified diseases of pancreas: Secondary | ICD-10-CM

## 2022-08-05 DIAGNOSIS — E876 Hypokalemia: Secondary | ICD-10-CM

## 2022-08-05 DIAGNOSIS — I480 Paroxysmal atrial fibrillation: Secondary | ICD-10-CM | POA: Diagnosis not present

## 2022-08-05 DIAGNOSIS — R030 Elevated blood-pressure reading, without diagnosis of hypertension: Secondary | ICD-10-CM

## 2022-08-05 LAB — POCT GLYCOSYLATED HEMOGLOBIN (HGB A1C): HbA1c, POC (controlled diabetic range): 9 % — AB (ref 0.0–7.0)

## 2022-08-05 MED ORDER — METFORMIN HCL 500 MG PO TABS
1000.0000 mg | ORAL_TABLET | Freq: Two times a day (BID) | ORAL | 3 refills | Status: DC
  Filled 2022-08-05: qty 120, 30d supply, fill #0
  Filled 2022-09-19 (×2): qty 120, 30d supply, fill #1
  Filled 2022-11-26: qty 120, 30d supply, fill #2
  Filled 2023-03-04: qty 120, 30d supply, fill #3

## 2022-08-05 MED ORDER — METOPROLOL SUCCINATE ER 25 MG PO TB24
25.0000 mg | ORAL_TABLET | Freq: Every day | ORAL | 1 refills | Status: DC
  Filled 2022-08-05: qty 30, 30d supply, fill #0
  Filled 2022-09-19: qty 30, 30d supply, fill #1
  Filled 2022-11-26: qty 30, 30d supply, fill #2

## 2022-08-05 MED ORDER — GLIPIZIDE 5 MG PO TABS
5.0000 mg | ORAL_TABLET | Freq: Two times a day (BID) | ORAL | 1 refills | Status: DC
  Filled 2022-08-05: qty 60, 30d supply, fill #0
  Filled 2022-09-19: qty 60, 30d supply, fill #1
  Filled 2022-11-26: qty 60, 30d supply, fill #2

## 2022-08-05 NOTE — Patient Instructions (Signed)
Please call the GI office as they have been trying to contact you: Justice Britain, MD [facility] Advanced Endoscopy Office # [phone]

## 2022-08-05 NOTE — Progress Notes (Signed)
Subjective:  Patient ID: Gary Mitchell, male    DOB: 07-09-1970  Age: 52 y.o. MRN: [id number]  CC: Diabetes   HPI Gary Mitchell is a 52 y.o. year old male with a history of alcoholic liver disease, pancreatitis, necrotic cyst, history of alcoholic hepatitis in 09/2200, alcohol-related seizures, type 2 diabetes mellitus (A1c of 9.0), previously smoked (quit 2 cig/day and quit 2022), paroxysmal A-fib who presents today for follow-up.    He presented to the ED with a seizure last week and was found to be in A-fib with RVR, heart rate in the 200s.  EMS had treated this with diltiazem with improvement.  Troponin was slightly elevated but thought to be secondary to demand ischemia. He was placed on Eliquis and also on Potassium (due to hypokalemia) CT head revealed no evidence of acute intracranial abnormality, atrophy and mild chronic small vessel ischemic changes of the white matter. He was subsequently discharged to follow-up with A-fib clinic.  Interval History: He informs me he had been adherent with his Keppra even though ED notes reveal he is stated he had lost his Keppra prior to seizure.  He denies ongoing alcohol consumption.  Endorses adherence with his medications.  GI notes reviewed from Dr. Rush Landmark after the case discussed at tumor board.  Plan is to repeat imaging in 8 weeks and possibly refer to pancreaticobiliary surgery if his pancreatic cyst increases in size.  It appears GI tried to contact him but were unsuccessful. He states his phone has been acting up.  He states he has been adherent with Metformin and his sugars have been elevated sometimes random sugars are in the 300s.  In the past glipizide was discontinued due to hypoglycemia.  He has no neuropathy or visual concerns. 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   Seizures ([place])    Thrombocytopenia ([place])     Past Surgical History:  Procedure Laterality Date   BIOPSY  08/02/2021   Procedure: BIOPSY;  Surgeon: Milus Banister, MD;  Location: [facility];  Service: Endoscopy;;   BIOPSY  05/07/2022   Procedure: BIOPSY;  Surgeon: [name], MD;  Location: [facility];  Service: Gastroenterology;;   COLONOSCOPY WITH PROPOFOL N/A 05/08/2022   Procedure: COLONOSCOPY WITH PROPOFOL;  Surgeon: [name], MD;  Location: [facility];  Service: Gastroenterology;  Laterality: N/A;   ENTEROSCOPY N/A 09/20/2020   Procedure: ENTEROSCOPY;  Surgeon: Thornton Park, MD;  Location: [facility];  Service: Gastroenterology;  Laterality: N/A;   ENTEROSCOPY N/A 05/11/2022   Procedure: ENTEROSCOPY;  Surgeon: [name], MD;  Location: [facility];  Service: Gastroenterology;  Laterality: N/A;   ESOPHAGOGASTRODUODENOSCOPY (EGD) WITH PROPOFOL N/A 08/02/2021   Procedure: ESOPHAGOGASTRODUODENOSCOPY (EGD) WITH PROPOFOL;  Surgeon: Milus Banister, MD;  [facility];  Service: Endoscopy;  Laterality: N/A;  ESOPHAGOGASTRODUODENOSCOPY (EGD) WITH PROPOFOL N/A 01/31/2022   Procedure: ESOPHAGOGASTRODUODENOSCOPY (EGD) WITH PROPOFOL;  Surgeon: Milus Banister, MD;  [facility];  Service: Gastroenterology;  Laterality: N/A;   ESOPHAGOGASTRODUODENOSCOPY (EGD) WITH PROPOFOL N/A 05/07/2022   Procedure: ESOPHAGOGASTRODUODENOSCOPY (EGD) WITH PROPOFOL;   Surgeon: [name], MD;  [facility];  Service: Gastroenterology;  Laterality: N/A;   ESOPHAGOGASTRODUODENOSCOPY (EGD) WITH PROPOFOL N/A 06/13/2022   Procedure: ESOPHAGOGASTRODUODENOSCOPY (EGD) WITH PROPOFOL;  Surgeon: Rush Landmark Telford Nab., MD;  [facility];  Service: Gastroenterology;  Laterality: N/A;   EUS N/A 01/31/2022   Procedure: UPPER ENDOSCOPIC ULTRASOUND (EUS) RADIAL;  Surgeon: Milus Banister, MD;  [facility];  Service: Gastroenterology;  Laterality: N/A;   EUS N/A 06/13/2022   Procedure: UPPER ENDOSCOPIC ULTRASOUND (EUS) LINEAR;  Surgeon: Irving Copas., MD;  [facility];  Service: Gastroenterology;  Laterality: N/A;   FINE NEEDLE ASPIRATION N/A 08/02/2021   Procedure: FINE NEEDLE ASPIRATION (FNA) LINEAR;  Surgeon: Milus Banister, MD;  [facility];  Service: Endoscopy;  Laterality: N/A;   FINE NEEDLE ASPIRATION N/A 01/31/2022   Procedure: FINE NEEDLE ASPIRATION (FNA) LINEAR;  Surgeon: Milus Banister, MD;  [facility];  Service: Gastroenterology;  Laterality: N/A;   FINE NEEDLE ASPIRATION N/A 06/13/2022   Procedure: FINE NEEDLE ASPIRATION (FNA) LINEAR;  Surgeon: Irving Copas., MD;  [facility];  Service: Gastroenterology;  Laterality: N/A;   GIVENS CAPSULE STUDY N/A 05/09/2022   Procedure: GIVENS CAPSULE STUDY;  Surgeon: [name], MD;  [facility];  Service: Gastroenterology;  Laterality: N/A;   HEMOSTASIS CLIP PLACEMENT  05/08/2022   Procedure: HEMOSTASIS CLIP PLACEMENT;  Surgeon: [name], MD;  Location: [facility];  Service: Gastroenterology;;   HEMOSTASIS CLIP PLACEMENT  05/11/2022   Procedure: HEMOSTASIS CLIP PLACEMENT;  Surgeon: [name], MD;  [facility];  Service: Gastroenterology;;   HOT HEMOSTASIS N/A 06/13/2022   Procedure: HOT HEMOSTASIS (ARGON PLASMA COAGULATION/BICAP);  Surgeon: Irving Copas., MD;   Location: [facility];  Service: Gastroenterology;  Laterality: N/A;   left kidney removed     POLYPECTOMY  05/08/2022   Procedure: POLYPECTOMY;  Surgeon: [name], MD;  Location: [facility];  Service: Gastroenterology;;   UPPER ESOPHAGEAL ENDOSCOPIC ULTRASOUND (EUS) N/A 08/02/2021   Procedure: UPPER ESOPHAGEAL ENDOSCOPIC ULTRASOUND (EUS);  Surgeon: Milus Banister, MD;  Location: [facility];  Service: Endoscopy;  Laterality: N/A;  Radial and Linear    Family History  Problem Relation Age of Onset   Diabetes Mellitus II Mother    Colon cancer Neg Hx    Esophageal cancer Neg Hx     Social History   Socioeconomic History   Marital status: Single    Spouse name: Not on file   Number of children: Not on file   Years of education: Not on file   Highest education level: Not on file  Occupational History   Not on file  Tobacco Use   Smoking status: Former    Packs/day: 0.10    Years: 25.00    Total pack years: 2.50    Types: Cigarettes    Quit date: 2019    Years since quitting: 5.0   Smokeless tobacco: Never  Vaping Use   Vaping Use: Never used  Substance and Sexual Activity   Alcohol use: Not Currently    Alcohol/week: 3.0 standard drinks of alcohol    Types: 3 Cans of beer per week   Drug use: Not Currently  Types: Marijuana   Sexual activity: Not Currently  Other Topics Concern   Not on file  Social History Narrative   Not on file   Social Determinants of Health   Financial Resource Strain: Not on file  Food Insecurity: No Food Insecurity (05/06/2022)   Hunger Vital Sign    Worried About Running Out of Food in the Last Year: Never true    Ran Out of Food in the Last Year: Never true  Transportation Needs: No Transportation Needs (05/06/2022)   PRAPARE - Hydrologist (Medical): No    Lack of Transportation (Non-Medical): No  Physical Activity: Not on file  Stress: Not on file  Social Connections: Not on file     Allergies  Allergen Reactions   Iohexol Other (See Comments)    Unknown reaction at 22 days old Mom at bedside reported patient was given injections of iohexol in foot and resulted in "hole in foot or foot got infected"    Outpatient Medications Prior to Visit  Medication Sig Dispense Refill   apixaban (ELIQUIS) 5 MG TABS tablet Take 1 tablet (5 mg total) by mouth 2 (two) times daily. 60 tablet 0   folic acid (FOLVITE) 1 MG tablet Take 1 tablet (1 mg total) by mouth daily. 30 tablet 2   gabapentin (NEURONTIN) 300 MG capsule Take 1 capsule (300 mg total) by mouth at bedtime. 30 capsule 3   lactulose (CHRONULAC) 10 GM/15ML solution Take 30 mLs (20 g total) by mouth 3 (three) times daily. 236 mL 5   levETIRAcetam (KEPPRA) 500 MG tablet Take 500 mg by mouth 2 (two) times daily.     levETIRAcetam (KEPPRA) 500 MG tablet Take 1 tablet (500 mg total) by mouth 2 (two) times daily. 60 tablet 3   levETIRAcetam (KEPPRA) 500 MG tablet Take 1 tablet (500 mg total) by mouth 2 (two) times daily. 60 tablet 1   Multiple Vitamin (MULTIVITAMIN) tablet Take 1 tablet by mouth at bedtime.     naltrexone (DEPADE) 50 MG tablet Take 1 tablet (50 mg total) by mouth daily. 30 tablet 3   pantoprazole (PROTONIX) 40 MG tablet Take 1 tablet (40 mg total) by mouth 2 (two) times daily. (Patient taking differently: Take 40 mg by mouth daily.) 30 tablet 3   potassium chloride SA (KLOR-CON M) 20 MEQ tablet Take 1 tablet (20 mEq total) by mouth 2 (two) times daily. 20 tablet 0   sucralfate (CARAFATE) 1 GM/10ML suspension Take 10 mLs (1 g total) by mouth 4 (four) times daily -  with meals and at bedtime. 420 mL 0   thiamine (VITAMIN B-1) 100 MG tablet Take 1 tablet (100 mg total) by mouth daily. 30 tablet 2   Thiamine HCl (B-1) 500 MG TABS Take 250 mg by mouth daily.     metFORMIN (GLUCOPHAGE) 500 MG tablet Take 2 tablets (1,000 mg total) by mouth 2 (two) times daily with a meal. 120 tablet 3   atorvastatin (LIPITOR) 20 MG  tablet Take 1 tablet (20 mg total) by mouth daily. (Patient not taking: Reported on 07/29/2022) 30 tablet 3   No facility-administered medications prior to visit.     ROS Review of Systems  Constitutional:  Negative for activity change and appetite change.  HENT:  Negative for sinus pressure and sore throat.   Respiratory:  Negative for chest tightness, shortness of breath and wheezing.   Cardiovascular:  Negative for chest pain and palpitations.  Gastrointestinal:  Negative for  abdominal distention, abdominal pain and constipation.  Genitourinary: Negative.   Musculoskeletal: Negative.   Psychiatric/Behavioral:  Negative for behavioral problems and dysphoric mood.     Objective:  BP (!) 148/89   Pulse 93   Temp 98.1 F (36.7 C) (Oral)   Ht '5\' 4"'$  (1.626 m)   Wt 130 lb 6.4 oz (59.1 kg)   SpO2 100%   BMI 22.38 kg/m      08/05/2022    3:44 PM 08/05/2022    3:22 PM 07/30/2022    1:15 AM  BP/Weight  Systolic BP 016 010 932  Diastolic BP 89 87 78  Wt. (Lbs)  130.4   BMI  22.38 kg/m2       Physical Exam Constitutional:      Appearance: He is well-developed.  Cardiovascular:     Rate and Rhythm: Normal rate.     Heart sounds: Normal heart sounds. No murmur heard. Pulmonary:     Effort: Pulmonary effort is normal.     Breath sounds: Normal breath sounds. No wheezing or rales.  Chest:     Chest wall: No tenderness.  Abdominal:     General: Bowel sounds are normal. There is no distension.     Palpations: Abdomen is soft. There is no mass.     Tenderness: There is no abdominal tenderness.  Musculoskeletal:        General: Normal range of motion.     Right lower leg: No edema.     Left lower leg: No edema.  Neurological:     Mental Status: He is alert and oriented to person, place, and time.  Psychiatric:        Mood and Affect: Mood normal.        Latest Ref Rng & Units 07/29/2022    9:59 PM 06/13/2022    8:31 AM 05/15/2022    5:06 PM  CMP  Glucose 70 - 99  mg/dL 250  225  178   BUN 6 - 20 mg/dL '5  7  11   '$ Creatinine 0.61 - 1.24 mg/dL 0.68  <0.30  0.87   Sodium 135 - 145 mmol/L 129  134  138   Potassium 3.5 - 5.1 mmol/L 3.2  4.0  3.7   Chloride 98 - 111 mmol/L 93  97  110   CO2 22 - 32 mmol/L '15  26  19   '$ Calcium 8.9 - 10.3 mg/dL 7.8  8.9  8.9   Total Protein 6.5 - 8.1 g/dL 6.9  8.2  7.6   Total Bilirubin 0.3 - 1.2 mg/dL 7.4  4.4  7.7   Alkaline Phos 38 - 126 U/L 116  133  100   AST 15 - 41 U/L 81  51  55   ALT 0 - 44 U/L 34  19  22     Lipid Panel     Component Value Date/Time   TRIG 120 07/09/2020 1240    CBC    Component Value Date/Time   WBC 6.5 07/29/2022 2159   RBC 3.54 (L) 07/29/2022 2159   HGB 10.6 (L) 07/29/2022 2159   HGB 10.6 (L) 10/03/2020 1127   HCT 29.7 (L) 07/29/2022 2159   HCT 30.0 (L) 10/03/2020 1127   PLT 70 (L) 07/29/2022 2159   PLT 301 10/03/2020 1127   MCV 83.9 07/29/2022 2159   MCV 74 (L) 10/03/2020 1127   MCH 29.9 07/29/2022 2159   MCHC 35.7 07/29/2022 2159   RDW 22.3 (H) 07/29/2022 2159  RDW 21.1 (H) 10/03/2020 1127   LYMPHSABS 1.1 07/29/2022 2159   LYMPHSABS 2.2 10/03/2020 1127   MONOABS 0.7 07/29/2022 2159   EOSABS 0.1 07/29/2022 2159   EOSABS 0.1 10/03/2020 1127   BASOSABS 0.0 07/29/2022 2159   BASOSABS 0.2 10/03/2020 1127    Lab Results  Component Value Date   HGBA1C 9.0 (A) 08/05/2022    Assessment & Plan:  1. Type 2 diabetes mellitus with other specified complication, without long-term current use of insulin (HCC) Uncontrolled with A1c of 9.0, goal is less than 7.0 Glipizide added to regimen He qualifies for the [facility] study and I have placed referral and provided him with information Counseled on Diabetic diet, my plate method, 357 minutes of moderate intensity exercise/week Blood sugar logs with fasting goals of 80-120 mg/dl, random of less than 180 and in the event of sugars less than 60 mg/dl or greater than 400 mg/dl encouraged to notify the clinic. Advised on the need for  annual eye exams, annual foot exams, Pneumonia vaccine. - POCT glycosylated hemoglobin (Hb A1C) - AMB Referral to Pharmacy Medication Management - glipiZIDE (GLUCOTROL) 5 MG tablet; Take 1 tablet (5 mg total) by mouth 2 (two) times daily before a meal.  Dispense: 180 tablet; Refill: 1 - Microalbumin/Creatinine Ratio, Urine - metFORMIN (GLUCOPHAGE) 500 MG tablet; Take 2 tablets (1,000 mg total) by mouth 2 (two) times daily with a meal.  Dispense: 120 tablet; Refill: 3  2. Hypokalemia Discharge potassium was 3.2 He has been taking potassium supplements - CMP14+EGFR  3. Pancreatic mass Pancreatic cyst Currently under GI care with ongoing workup I have provided him with the phone number to [facility] to contact them as they have been trying to reach him to schedule follow-up  4. Paroxysmal A-fib (HCC) Currently in sinus rhythm CHA2DS2-VASc score of 3 Currently on Eliquis Follow-up with the Afib clinic I have initiated Toprol-XL for rate control especially in the setting of elevated blood pressure - metoprolol succinate (TOPROL-XL) 25 MG 24 hr tablet; Take 1 tablet (25 mg total) by mouth daily.  Dispense: 90 tablet; Refill: 1  5. Elevated blood pressure reading in office without diagnosis of hypertension He previously did not have a diagnosis of hypertension I have placed him on metoprolol given new diagnosis of paroxysmal A-fib.    Meds ordered this encounter  Medications   glipiZIDE (GLUCOTROL) 5 MG tablet    Sig: Take 1 tablet (5 mg total) by mouth 2 (two) times daily before a meal.    Dispense:  180 tablet    Refill:  1   metFORMIN (GLUCOPHAGE) 500 MG tablet    Sig: Take 2 tablets (1,000 mg total) by mouth 2 (two) times daily with a meal.    Dispense:  120 tablet    Refill:  3   metoprolol succinate (TOPROL-XL) 25 MG 24 hr tablet    Sig: Take 1 tablet (25 mg total) by mouth daily.    Dispense:  90 tablet    Refill:  1    Follow-up: Return in about 3 months (around  11/04/2022) for Chronic medical conditions.       Charlott Rakes, MD, FAAFP. [facility], [place]   08/05/2022, 5:48 PM

## 2022-08-06 ENCOUNTER — Telehealth: Payer: Self-pay | Admitting: Pharmacist

## 2022-08-06 LAB — CMP14+EGFR
ALT: 26 IU/L (ref 0–44)
AST: 46 IU/L — ABNORMAL HIGH (ref 0–40)
Albumin/Globulin Ratio: 1.2 (ref 1.2–2.2)
Albumin: 4.2 g/dL (ref 4.1–5.1)
Alkaline Phosphatase: 168 IU/L — ABNORMAL HIGH (ref 44–121)
BUN/Creatinine Ratio: 19 (ref 9–20)
BUN: 10 mg/dL (ref 6–24)
Bilirubin Total: 6 mg/dL — ABNORMAL HIGH (ref 0.0–1.2)
CO2: 19 mmol/L — ABNORMAL LOW (ref 20–29)
Calcium: 9.8 mg/dL (ref 8.7–10.2)
Chloride: 92 mmol/L — ABNORMAL LOW (ref 96–106)
Creatinine, Ser: 0.54 mg/dL — ABNORMAL LOW (ref 0.76–1.27)
Globulin, Total: 3.5 g/dL (ref 1.5–4.5)
Glucose: 294 mg/dL — ABNORMAL HIGH (ref 70–99)
Potassium: 4.1 mmol/L (ref 3.5–5.2)
Sodium: 127 mmol/L — ABNORMAL LOW (ref 134–144)
Total Protein: 7.7 g/dL (ref 6.0–8.5)
eGFR: 122 mL/min/{1.73_m2} (ref >59)

## 2022-08-06 NOTE — Telephone Encounter (Signed)
   Called patient to complete initial consent for the LIBERATE study. Pt was unavailable. Called and spoke to his mother, Brees Hounshell. I introduced myself and explained the reason for my call. She informed me that his phone is not working and she will be ordering him a new phone. Should be here in ~1 week. I left my information for him to contact me once he receives his new phone. Pt's mom assures me she will have him contact me and was thankful for my call.   Benard Halsted, PharmD, [facility] [place], [place] [phone]

## 2022-08-07 LAB — MICROALBUMIN / CREATININE URINE RATIO
Creatinine, Urine: 401.6 mg/dL
Microalb/Creat Ratio: 25 mg/g{creat} (ref 0–29)
Microalbumin, Urine: 100 ug/mL

## 2022-08-08 ENCOUNTER — Encounter (HOSPITAL_COMMUNITY): Payer: Self-pay | Admitting: Physician Assistant

## 2022-08-08 ENCOUNTER — Encounter (HOSPITAL_COMMUNITY): Payer: Self-pay

## 2022-08-08 ENCOUNTER — Ambulatory Visit (HOSPITAL_COMMUNITY)
Admission: RE | Admit: 2022-08-08 | Discharge: 2022-08-08 | Disposition: A | Discharge status: Home / Self Care | Payer: 59 | Source: Ambulatory Visit | Attending: Physician Assistant | Admitting: Physician Assistant

## 2022-08-08 VITALS — BP 118/88 | HR 90 | Ht 64.0 in | Wt 127.8 lb

## 2022-08-08 DIAGNOSIS — D6869 Other thrombophilia: Secondary | ICD-10-CM | POA: Insufficient documentation

## 2022-08-08 DIAGNOSIS — K862 Cyst of pancreas: Secondary | ICD-10-CM | POA: Insufficient documentation

## 2022-08-08 DIAGNOSIS — Z833 Family history of diabetes mellitus: Secondary | ICD-10-CM | POA: Insufficient documentation

## 2022-08-08 DIAGNOSIS — E119 Type 2 diabetes mellitus without complications: Secondary | ICD-10-CM | POA: Diagnosis not present

## 2022-08-08 DIAGNOSIS — Z79899 Other long term (current) drug therapy: Secondary | ICD-10-CM | POA: Diagnosis not present

## 2022-08-08 DIAGNOSIS — Z8673 Personal history of transient ischemic attack (TIA), and cerebral infarction without residual deficits: Secondary | ICD-10-CM | POA: Diagnosis not present

## 2022-08-08 DIAGNOSIS — I480 Paroxysmal atrial fibrillation: Secondary | ICD-10-CM | POA: Diagnosis present

## 2022-08-08 DIAGNOSIS — Z87891 Personal history of nicotine dependence: Secondary | ICD-10-CM | POA: Diagnosis not present

## 2022-08-08 DIAGNOSIS — Z7901 Long term (current) use of anticoagulants: Secondary | ICD-10-CM | POA: Insufficient documentation

## 2022-08-08 NOTE — Progress Notes (Signed)
Primary Care Physician: Charlott Rakes, MD Primary Cardiologist: none Primary Electrophysiologist: none Referring Physician: [facility]   Gary Mitchell is a 52 y.o. male with a history of hepatitis, cirrhosis, alcohol abuse, DM, seizure disorder, CVA, pancreatic cyst (Dr Rush Landmark), atrial fibrillation who presents for consultation in the [facility].  The patient was initially diagnosed with atrial fibrillation 07/29/22 after presenting to the ED following a seizure. He had been without his Keppra for several days. Seizure witnessed by his family. EMS was called and ECG showed afib with RVR. Patient spontaneously converted to SR. He was started on Eliquis for a CHADS2VASC score of 3. He saw his PCP on 08/05/22 and was started on metoprolol.   Today, patient reports that he feels well. He was unaware of his afib a the time of his diagnosis. He reports compliance with his medications, no bleeding issues on anticoagulation.   Today, he denies symptoms of palpitations, chest pain, shortness of breath, orthopnea, PND, lower extremity edema, dizziness, presyncope, syncope, snoring, daytime somnolence, bleeding, or neurologic sequela. The patient is tolerating medications without difficulties and is otherwise without complaint today.    Atrial Fibrillation Risk Factors:  he does not have symptoms or diagnosis of sleep apnea. he does not have a history of rheumatic fever. he does have a history of alcohol use.   he has a BMI of Body mass index is 21.94 kg/m.Marland Kitchen Filed Weights   08/08/22 1151  Weight: 58 kg    Family History  Problem Relation Age of Onset   Diabetes Mellitus II Mother    Colon cancer Neg Hx    Esophageal cancer Neg Hx      Atrial Fibrillation Management history:  Previous antiarrhythmic drugs: none Previous cardioversions: none Previous ablations: none CHADS2VASC score: 3 Anticoagulation history: Eliquis   Past Medical History:   Diagnosis Date   Alcoholic hepatitis 34/9179   Anemia    Cirrhosis with alcoholism ([place]) 11/2019   Coagulopathy ([place])    Attributed to liver disease/cirrhosis   Diabetes mellitus without complication ([place])    type 2   Neuromuscular disorder ([place])    neuropathy  feet legs   Pancreatic lesion 11/2019   Initially concerning for neoplasm but improved appearance on MRI 12/2019 at which time pseudocyst was most likely diagnosis.   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   Seizures ([place])    Thrombocytopenia ([place])    Past Surgical History:  Procedure Laterality Date   BIOPSY  08/02/2021   Procedure: BIOPSY;  Surgeon: Milus Banister, MD;  Location: [facility];  Service: Endoscopy;;   BIOPSY  05/07/2022   Procedure: BIOPSY;  Surgeon: [name], MD;  Location: [facility];  Service: Gastroenterology;;   COLONOSCOPY WITH PROPOFOL N/A 05/08/2022   Procedure: COLONOSCOPY WITH PROPOFOL;  Surgeon: [name], MD;  Location: [facility];  Service: Gastroenterology;  Laterality: N/A;   ENTEROSCOPY N/A 09/20/2020   Procedure: ENTEROSCOPY;  Surgeon: Thornton Park, MD;  Location: [facility];  Service: Gastroenterology;  Laterality: N/A;   ENTEROSCOPY N/A 05/11/2022   Procedure: ENTEROSCOPY;  Surgeon: [name], MD;  Location: [facility];  Service: Gastroenterology;  Laterality: N/A;   ESOPHAGOGASTRODUODENOSCOPY (EGD) WITH PROPOFOL N/A 08/02/2021   Procedure: ESOPHAGOGASTRODUODENOSCOPY (EGD) WITH PROPOFOL;  Surgeon: Milus Banister, MD;  [facility];  Service: Endoscopy;  Laterality: N/A;   ESOPHAGOGASTRODUODENOSCOPY (EGD)  WITH PROPOFOL N/A 01/31/2022   Procedure: ESOPHAGOGASTRODUODENOSCOPY (EGD) WITH PROPOFOL;  Surgeon: Milus Banister, MD;  [facility];  Service: Gastroenterology;  Laterality: N/A;   ESOPHAGOGASTRODUODENOSCOPY (EGD) WITH PROPOFOL N/A 05/07/2022    Procedure: ESOPHAGOGASTRODUODENOSCOPY (EGD) WITH PROPOFOL;  Surgeon: [name], MD;  [facility];  Service: Gastroenterology;  Laterality: N/A;   ESOPHAGOGASTRODUODENOSCOPY (EGD) WITH PROPOFOL N/A 06/13/2022   Procedure: ESOPHAGOGASTRODUODENOSCOPY (EGD) WITH PROPOFOL;  Surgeon: Rush Landmark Telford Nab., MD;  [facility];  Service: Gastroenterology;  Laterality: N/A;   EUS N/A 01/31/2022   Procedure: UPPER ENDOSCOPIC ULTRASOUND (EUS) RADIAL;  Surgeon: Milus Banister, MD;  [facility];  Service: Gastroenterology;  Laterality: N/A;   EUS N/A 06/13/2022   Procedure: UPPER ENDOSCOPIC ULTRASOUND (EUS) LINEAR;  Surgeon: Irving Copas., MD;  [facility];  Service: Gastroenterology;  Laterality: N/A;   FINE NEEDLE ASPIRATION N/A 08/02/2021   Procedure: FINE NEEDLE ASPIRATION (FNA) LINEAR;  Surgeon: Milus Banister, MD;  [facility];  Service: Endoscopy;  Laterality: N/A;   FINE NEEDLE ASPIRATION N/A 01/31/2022   Procedure: FINE NEEDLE ASPIRATION (FNA) LINEAR;  Surgeon: Milus Banister, MD;  [facility];  Service: Gastroenterology;  Laterality: N/A;   FINE NEEDLE ASPIRATION N/A 06/13/2022   Procedure: FINE NEEDLE ASPIRATION (FNA) LINEAR;  Surgeon: Irving Copas., MD;  [facility];  Service: Gastroenterology;  Laterality: N/A;   GIVENS CAPSULE STUDY N/A 05/09/2022   Procedure: GIVENS CAPSULE STUDY;  Surgeon: [name], MD;  [facility];  Service: Gastroenterology;  Laterality: N/A;   HEMOSTASIS CLIP PLACEMENT  05/08/2022   Procedure: HEMOSTASIS CLIP PLACEMENT;  Surgeon: [name], MD;  Location: [facility];  Service: Gastroenterology;;   HEMOSTASIS CLIP PLACEMENT  05/11/2022   Procedure: HEMOSTASIS CLIP PLACEMENT;  Surgeon: [name], MD;  [facility];  Service: Gastroenterology;;   HOT HEMOSTASIS N/A 06/13/2022   Procedure: HOT HEMOSTASIS (ARGON PLASMA  COAGULATION/BICAP);  Surgeon: Irving Copas., MD;  Location: [facility];  Service: Gastroenterology;  Laterality: N/A;   left kidney removed     POLYPECTOMY  05/08/2022   Procedure: POLYPECTOMY;  Surgeon: [name], MD;  Location: [facility];  Service: Gastroenterology;;   UPPER ESOPHAGEAL ENDOSCOPIC ULTRASOUND (EUS) N/A 08/02/2021   Procedure: UPPER ESOPHAGEAL ENDOSCOPIC ULTRASOUND (EUS);  Surgeon: Milus Banister, MD;  Location: [facility];  Service: Endoscopy;  Laterality: N/A;  Radial and Linear    Current Outpatient Medications  Medication Sig Dispense Refill   apixaban (ELIQUIS) 5 MG TABS tablet Take 1 tablet (5 mg total) by mouth 2 (two) times daily. 60 tablet 0   atorvastatin (LIPITOR) 20 MG tablet Take 1 tablet (20 mg total) by mouth daily. 30 tablet 3   folic acid (FOLVITE) 1 MG tablet Take 1 tablet (1 mg total) by mouth daily. 30 tablet 2   gabapentin (NEURONTIN) 300 MG capsule Take 1 capsule (300 mg total) by mouth at bedtime. 30 capsule 3   glipiZIDE (GLUCOTROL) 5 MG tablet Take 1 tablet (5 mg total) by mouth 2 (two) times daily before a meal. 180 tablet 1   lactulose (CHRONULAC) 10 GM/15ML solution Take 30 mLs (20 g total) by mouth 3 (three) times daily. 236 mL 5   levETIRAcetam (KEPPRA) 500 MG tablet Take 500 mg by mouth 2 (two) times daily.     levETIRAcetam (KEPPRA) 500 MG tablet Take 1 tablet (500 mg total) by mouth 2 (two) times daily. 60 tablet 3  levETIRAcetam (KEPPRA) 500 MG tablet Take 1 tablet (500 mg total) by mouth 2 (two) times daily. 60 tablet 1   metFORMIN (GLUCOPHAGE) 500 MG tablet Take 2 tablets (1,000 mg total) by mouth 2 (two) times daily with a meal. 120 tablet 3   metoprolol succinate (TOPROL-XL) 25 MG 24 hr tablet Take 1 tablet (25 mg total) by mouth daily. 90 tablet 1   Multiple Vitamin (MULTIVITAMIN) tablet Take 1 tablet by mouth at bedtime.     naltrexone (DEPADE) 50 MG tablet Take 1 tablet (50 mg total) by mouth daily. 30 tablet 3    pantoprazole (PROTONIX) 40 MG tablet Take 1 tablet (40 mg total) by mouth 2 (two) times daily. (Patient taking differently: Take 40 mg by mouth daily.) 30 tablet 3   potassium chloride SA (KLOR-CON M) 20 MEQ tablet Take 1 tablet (20 mEq total) by mouth 2 (two) times daily. 20 tablet 0   sucralfate (CARAFATE) 1 GM/10ML suspension Take 10 mLs (1 g total) by mouth 4 (four) times daily -  with meals and at bedtime. 420 mL 0   thiamine (VITAMIN B-1) 100 MG tablet Take 1 tablet (100 mg total) by mouth daily. 30 tablet 2   Thiamine HCl (B-1) 500 MG TABS Take 250 mg by mouth daily.     No current facility-administered medications for this encounter.    Allergies  Allergen Reactions   Iohexol Other (See Comments)    Unknown reaction at 28 days old Mom at bedside reported patient was given injections of iohexol in foot and resulted in "hole in foot or foot got infected"    Social History   Socioeconomic History   Marital status: Single    Spouse name: Not on file   Number of children: Not on file   Years of education: Not on file   Highest education level: Not on file  Occupational History   Not on file  Tobacco Use   Smoking status: Former    Packs/day: 0.10    Years: 25.00    Total pack years: 2.50    Types: Cigarettes    Quit date: 2019    Years since quitting: 5.0   Smokeless tobacco: Never   Tobacco comments:    Former smoker 08/08/22  Vaping Use   Vaping Use: Never used  Substance and Sexual Activity   Alcohol use: Not Currently    Alcohol/week: 3.0 standard drinks of alcohol    Types: 3 Cans of beer per week   Drug use: Not Currently    Types: Marijuana   Sexual activity: Not Currently  Other Topics Concern   Not on file  Social History Narrative   Not on file   Social Determinants of Health   Financial Resource Strain: Not on file  Food Insecurity: No Food Insecurity (05/06/2022)   Hunger Vital Sign    Worried About Running Out of Food in the Last Year: Never  true    Ran Out of Food in the Last Year: Never true  Transportation Needs: No Transportation Needs (05/06/2022)   PRAPARE - Hydrologist (Medical): No    Lack of Transportation (Non-Medical): No  Physical Activity: Not on file  Stress: Not on file  Social Connections: Not on file  Intimate Partner Violence: Not At Risk (05/06/2022)   Humiliation, Afraid, Rape, and Kick questionnaire    Fear of Current or Ex-Partner: No    Emotionally Abused: No    Physically Abused: No  Sexually Abused: No     ROS- All systems are reviewed and negative except as per the HPI above.  Physical Exam: Vitals:   08/08/22 1151  BP: 118/88  Pulse: 90  Weight: 58 kg  Height: '5\' 4"'$  (1.626 m)    GEN- The patient is a well appearing male, alert and oriented x 3 today.   Head- normocephalic, atraumatic Eyes-  Sclera clear, conjunctiva pink Ears- hearing intact Oropharynx- clear Neck- supple  Lungs- Clear to ausculation bilaterally, normal work of breathing Heart- Regular rate and rhythm, no murmurs, rubs or gallops  GI- soft, NT, ND, + BS Extremities- no clubbing, cyanosis, or edema MS- no significant deformity or atrophy Skin- no rash or lesion Psych- euthymic mood, full affect Neuro- strength and sensation are intact  Wt Readings from Last 3 Encounters:  08/08/22 58 kg  08/05/22 59.1 kg  07/29/22 65.8 kg    EKG today demonstrates  SR Vent. rate 90 BPM PR interval 160 ms QRS duration 84 ms QT/QTcB 352/430 ms  Epic records are reviewed at length today  CHA2DS2-VASc Score = 3  The patient's score is based upon: CHF History: 0 HTN History: 0 Diabetes History: 1 Stroke History: 2 Vascular Disease History: 0 Age Score: 0 Gender Score: 0       ASSESSMENT AND PLAN: 1. Paroxysmal Atrial Fibrillation (ICD10:  I48.0) The patient's CHA2DS2-VASc score is 3, indicating a 3.2% annual risk of stroke.   General education about afib provided and questions  answered. We also discussed his stroke risk and the risks and benefits of anticoagulation. ? If episode related to seizure.  Check echocardiogram Continue Eliquis 5 mg BID Continue Toprol 25 mg daily Unclear if he would be a candidate for invasive EP procedures with other comorbidities.  He reports abstinence from alcohol over the last few weeks.  If he has recurrence of his afib, could consider AAD.   2. Secondary Hypercoagulable State (ICD10:  D68.69) The patient is at significant risk for stroke/thromboembolism based upon his CHA2DS2-VASc Score of 3.  Continue Apixaban (Eliquis).   3. 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    Adline Peals PA-C [facility] [address] [place], [place] [address] [phone] 08/08/2022 12:01 PM

## 2022-08-12 ENCOUNTER — Emergency Department (HOSPITAL_COMMUNITY)
Admission: EM | Admit: 2022-08-12 | Discharge: 2022-08-13 | Disposition: A | Discharge status: Home / Self Care | Payer: 59 | Attending: Emergency Medicine | Admitting: Emergency Medicine

## 2022-08-12 ENCOUNTER — Encounter (HOSPITAL_COMMUNITY): Payer: Self-pay

## 2022-08-12 ENCOUNTER — Other Ambulatory Visit: Payer: Self-pay

## 2022-08-12 DIAGNOSIS — Z7901 Long term (current) use of anticoagulants: Secondary | ICD-10-CM | POA: Insufficient documentation

## 2022-08-12 DIAGNOSIS — F10921 Alcohol use, unspecified with intoxication delirium: Secondary | ICD-10-CM

## 2022-08-12 DIAGNOSIS — F10121 Alcohol abuse with intoxication delirium: Secondary | ICD-10-CM | POA: Diagnosis not present

## 2022-08-12 DIAGNOSIS — F10129 Alcohol abuse with intoxication, unspecified: Secondary | ICD-10-CM | POA: Diagnosis present

## 2022-08-12 DIAGNOSIS — Z7984 Long term (current) use of oral hypoglycemic drugs: Secondary | ICD-10-CM | POA: Insufficient documentation

## 2022-08-12 DIAGNOSIS — Y908 Blood alcohol level of 240 mg/100 ml or more: Secondary | ICD-10-CM | POA: Diagnosis not present

## 2022-08-12 LAB — COMPREHENSIVE METABOLIC PANEL
ALT: 25 U/L (ref 0–44)
AST: 45 U/L — ABNORMAL HIGH (ref 15–41)
Albumin: 3.5 g/dL (ref 3.5–5.0)
Alkaline Phosphatase: 137 U/L — ABNORMAL HIGH (ref 38–126)
Anion gap: 14 (ref 5–15)
BUN: 6 mg/dL (ref 6–20)
CO2: 20 mmol/L — ABNORMAL LOW (ref 22–32)
Calcium: 9.1 mg/dL (ref 8.9–10.3)
Chloride: 103 mmol/L (ref 98–111)
Creatinine, Ser: 0.6 mg/dL — ABNORMAL LOW (ref 0.61–1.24)
GFR, Estimated: >60 mL/min (ref >60)
Glucose, Bld: 355 mg/dL — ABNORMAL HIGH (ref 70–99)
Potassium: 4 mmol/L (ref 3.5–5.1)
Sodium: 137 mmol/L (ref 135–145)
Total Bilirubin: 3.9 mg/dL — ABNORMAL HIGH (ref 0.3–1.2)
Total Protein: 7.8 g/dL (ref 6.5–8.1)

## 2022-08-12 LAB — CBC WITH DIFFERENTIAL/PLATELET
Abs Immature Granulocytes: 0.01 10*3/uL (ref 0.00–0.07)
Basophils Absolute: 0.1 10*3/uL (ref 0.0–0.1)
Basophils Relative: 1 %
Eosinophils Absolute: 0.1 10*3/uL (ref 0.0–0.5)
Eosinophils Relative: 1 %
HCT: 35.4 % — ABNORMAL LOW (ref 39.0–52.0)
Hemoglobin: 12.3 g/dL — ABNORMAL LOW (ref 13.0–17.0)
Immature Granulocytes: 0 %
Lymphocytes Relative: 41 %
Lymphs Abs: 3.2 10*3/uL (ref 0.7–4.0)
MCH: 29.8 pg (ref 26.0–34.0)
MCHC: 34.7 g/dL (ref 30.0–36.0)
MCV: 85.7 fL (ref 80.0–100.0)
Monocytes Absolute: 0.6 10*3/uL (ref 0.1–1.0)
Monocytes Relative: 7 %
Neutro Abs: 4 10*3/uL (ref 1.7–7.7)
Neutrophils Relative %: 50 %
Platelets: 151 10*3/uL (ref 150–400)
RBC: 4.13 MIL/uL — ABNORMAL LOW (ref 4.22–5.81)
RDW: 19.7 % — ABNORMAL HIGH (ref 11.5–15.5)
WBC: 7.9 10*3/uL (ref 4.0–10.5)
nRBC: 0 % (ref 0.0–0.2)

## 2022-08-12 LAB — ETHANOL: Alcohol, Ethyl (B): 409 mg/dL (ref <10)

## 2022-08-12 LAB — CBG MONITORING, ED: Glucose-Capillary: 406 mg/dL — ABNORMAL HIGH (ref 70–99)

## 2022-08-12 MED ORDER — SODIUM CHLORIDE 0.9 % IV BOLUS
1000.0000 mL | Freq: Once | INTRAVENOUS | Status: AC
  Administered 2022-08-12: 1000 mL via INTRAVENOUS

## 2022-08-12 NOTE — ED Provider Notes (Signed)
[facility] Provider Note   CSN: [id number] Arrival date & time: 08/12/22  2037     History  Chief Complaint  Patient presents with   Alcohol Intoxication    Pt BIBA from in front of his mother's home. Mom called out saying she couldn't get him off the floor. Denies head injury. As per ems patient bgs was in the 400. Noncompliance with medication.     Gary Mitchell is a 52 y.o. male.  HPI Patient presents via EMS with concern for listlessness.  Patient self denies complaints, is smiling, though he has evidence for clinical intoxication.  EMS reports the patient was hemodynamically unremarkable unremarkable en route, was awake, alert, pleasant.  Per report the patient was on the ground from his mother's house after previously having fallen asleep in the car.  Patient denies trauma, denies medical problems, acknowledges drinking alcohol, denies other coingestants.    Home Medications Prior to Admission medications   Medication Sig Start Date End Date Taking? Authorizing Provider  apixaban (ELIQUIS) 5 MG TABS tablet Take 1 tablet (5 mg total) by mouth 2 (two) times daily. 07/30/22 08/29/22  Jeanell Sparrow, DO  atorvastatin (LIPITOR) 20 MG tablet Take 1 tablet (20 mg total) by mouth daily. 05/29/21   Charlott Rakes, MD  folic acid (FOLVITE) 1 MG tablet Take 1 tablet (1 mg total) by mouth daily. 05/13/22   Hosie Poisson, MD  gabapentin (NEURONTIN) 300 MG capsule Take 1 capsule (300 mg total) by mouth at bedtime. 11/20/21   Charlott Rakes, MD  glipiZIDE (GLUCOTROL) 5 MG tablet Take 1 tablet (5 mg total) by mouth 2 (two) times daily before a meal. 08/05/22   Charlott Rakes, MD  lactulose (CHRONULAC) 10 GM/15ML solution Take 30 mLs (20 g total) by mouth 3 (three) times daily. 05/12/22   Hosie Poisson, MD  levETIRAcetam (KEPPRA) 500 MG tablet Take 500 mg by mouth 2 (two) times daily.    [provider]  levETIRAcetam (KEPPRA) 500 MG tablet  Take 1 tablet (500 mg total) by mouth 2 (two) times daily. 07/10/22 10/08/22  Charlott Rakes, MD  levETIRAcetam (KEPPRA) 500 MG tablet Take 1 tablet (500 mg total) by mouth 2 (two) times daily. 07/30/22 09/28/22  Jeanell Sparrow, DO  metFORMIN (GLUCOPHAGE) 500 MG tablet Take 2 tablets (1,000 mg total) by mouth 2 (two) times daily with a meal. 08/05/22   Charlott Rakes, MD  metoprolol succinate (TOPROL-XL) 25 MG 24 hr tablet Take 1 tablet (25 mg total) by mouth daily. 08/05/22   Charlott Rakes, MD  Multiple Vitamin (MULTIVITAMIN) tablet Take 1 tablet by mouth at bedtime.    [provider]  naltrexone (DEPADE) 50 MG tablet Take 1 tablet (50 mg total) by mouth daily. 10/03/20   Charlott Rakes, MD  pantoprazole (PROTONIX) 40 MG tablet Take 1 tablet (40 mg total) by mouth 2 (two) times daily. Patient taking differently: Take 40 mg by mouth daily. 05/12/22   Hosie Poisson, MD  potassium chloride SA (KLOR-CON M) 20 MEQ tablet Take 1 tablet (20 mEq total) by mouth 2 (two) times daily. 5/95/63   Delora Fuel, MD  sucralfate (CARAFATE) 1 GM/10ML suspension Take 10 mLs (1 g total) by mouth 4 (four) times daily -  with meals and at bedtime. 05/12/22   Hosie Poisson, MD  thiamine (VITAMIN B-1) 100 MG tablet Take 1 tablet (100 mg total) by mouth daily. 05/13/22   Hosie Poisson, MD  Thiamine HCl (B-1)  500 MG TABS Take 250 mg by mouth daily.    [provider]      Allergies    Iohexol    Review of Systems   Review of Systems  Unable to perform ROS: Acuity of condition    Physical Exam Updated Vital Signs BP (!) 119/90 (BP Location: Right Arm)   Pulse (!) 104   Temp 97.6 F (36.4 C) (Oral)   Resp 18   SpO2 100%  Physical Exam Vitals and nursing note reviewed.  Constitutional:      General: He is not in acute distress.    Appearance: He is well-developed.     Comments: Clinically intoxicated thin adult male in no distress  HENT:     Head: Normocephalic and atraumatic.  Eyes:      Conjunctiva/sclera: Conjunctivae normal.  Cardiovascular:     Rate and Rhythm: Normal rate and regular rhythm.  Pulmonary:     Effort: Pulmonary effort is normal. No respiratory distress.     Breath sounds: No stridor.  Abdominal:     General: There is no distension.  Skin:    General: Skin is warm and dry.  Neurological:     Mental Status: He is alert.     Comments: Moves all extremity spontaneously follows commands somewhat, speech is slurred  Psychiatric:     Comments: Smiling, pleasant     ED Results / Procedures / Treatments   Labs (all labs ordered are listed, but only abnormal results are displayed) Labs Reviewed  ETHANOL - Abnormal; Notable for the following components:      Result Value   Alcohol, Ethyl (B) 409 (*)    All other components within normal limits  COMPREHENSIVE METABOLIC PANEL - Abnormal; Notable for the following components:   CO2 20 (*)    Glucose, Bld 355 (*)    Creatinine, Ser 0.60 (*)    AST 45 (*)    Alkaline Phosphatase 137 (*)    Total Bilirubin 3.9 (*)    All other components within normal limits  CBC WITH DIFFERENTIAL/PLATELET - Abnormal; Notable for the following components:   RBC 4.13 (*)    Hemoglobin 12.3 (*)    HCT 35.4 (*)    RDW 19.7 (*)    All other components within normal limits  CBG MONITORING, ED - Abnormal; Notable for the following components:   Glucose-Capillary 406 (*)    All other components within normal limits    EKG EKG Interpretation  Date/Time:  Monday August 12 2022 20:42:11 EST Ventricular Rate:  99 PR Interval:  169 QRS Duration: 92 QT Interval:  359 QTC Calculation: 461 R Axis:   91 Text Interpretation: Sinus rhythm Borderline right axis deviation Artifact in lead(s) I II aVR aVL aVF V1 V5 Abnormal ECG Confirmed by Carmin Muskrat [phone]) on 08/12/2022 9:55:57 PM  Radiology No results found.  Procedures Procedures    Medications Ordered in ED Medications  sodium chloride 0.9 % bolus 1,000 mL  (1,000 mLs Intravenous New Bag/Given 08/12/22 2327)    ED Course/ Medical Decision Making/ A&P                             Medical Decision Making Patient presents via EMS after being found on the ground.  Evidence for clinical intoxication, lower suspicion for intracranial injury given his presentation consistent with intoxication, history of alcohol abuse, absence of evidence for seizures, trauma, or any complaints on the  part of the patient.  Initial labs consistent with intoxication, alcohol greater than 400.  Patient also hyperglycemic without anion gap, without distress, appropriate for fluid resuscitation, while awaiting sobriety.  Amount and/or Complexity of Data Reviewed Independent Historian: EMS Labs: ordered. Decision-making details documented in ED Course.  Risk Decision regarding hospitalization.  11:48 PM Patient in similar condition, Dr. Ayesha Rumpf aware         Final Clinical Impression(s) / ED Diagnoses Final diagnoses:  Alcohol intoxication with delirium [facility])    Rx / DC Orders ED Discharge Orders     None         Carmin Muskrat, MD 08/12/22 2348

## 2022-08-12 NOTE — ED Notes (Signed)
Pt attempted to use the urinal. Noticed unstable balance and urine and poop ended up on floor. Patient was cleaned and external male catheter placed.

## 2022-08-12 NOTE — ED Notes (Signed)
Pt mom is now at bedside, Call bell within reach

## 2022-08-13 NOTE — ED Notes (Signed)
Pt given meal bag and cup of water. Pt has no complaints at this time.

## 2022-08-13 NOTE — ED Notes (Signed)
Pt was given a meal tray and water.

## 2022-08-26 ENCOUNTER — Encounter: Payer: Self-pay | Admitting: Gastroenterology

## 2022-08-30 ENCOUNTER — Ambulatory Visit (HOSPITAL_COMMUNITY): Payer: 59

## 2022-09-13 ENCOUNTER — Ambulatory Visit (HOSPITAL_COMMUNITY)
Admission: RE | Admit: 2022-09-13 | Discharge: 2022-09-13 | Disposition: A | Discharge status: Home / Self Care | Payer: 59 | Source: Ambulatory Visit | Attending: Physician Assistant | Admitting: Physician Assistant

## 2022-09-13 ENCOUNTER — Encounter (HOSPITAL_COMMUNITY): Payer: Self-pay | Admitting: *Deleted

## 2022-09-13 DIAGNOSIS — I480 Paroxysmal atrial fibrillation: Secondary | ICD-10-CM | POA: Diagnosis not present

## 2022-09-13 DIAGNOSIS — E119 Type 2 diabetes mellitus without complications: Secondary | ICD-10-CM | POA: Diagnosis not present

## 2022-09-13 DIAGNOSIS — I4891 Unspecified atrial fibrillation: Secondary | ICD-10-CM | POA: Diagnosis present

## 2022-09-13 DIAGNOSIS — E785 Hyperlipidemia, unspecified: Secondary | ICD-10-CM | POA: Diagnosis not present

## 2022-09-13 LAB — ECHOCARDIOGRAM COMPLETE
Area-P 1/2: 3.77 cm2
Calc EF: 56.3 %
S' Lateral: 2.95 cm
Single Plane A2C EF: 58 %
Single Plane A4C EF: 56.4 %

## 2022-09-13 NOTE — Progress Notes (Signed)
  Echocardiogram 2D Echocardiogram has been performed.  Gary Mitchell 09/13/2022, 10:44 AM

## 2022-09-18 ENCOUNTER — Ambulatory Visit: Payer: 59 | Admitting: Cardiovascular Disease

## 2022-09-19 ENCOUNTER — Other Ambulatory Visit: Payer: Self-pay

## 2022-09-24 ENCOUNTER — Telehealth: Payer: Self-pay

## 2022-09-24 ENCOUNTER — Other Ambulatory Visit: Payer: Self-pay

## 2022-09-24 DIAGNOSIS — K8689 Other specified diseases of pancreas: Secondary | ICD-10-CM

## 2022-09-24 MED ORDER — PREDNISONE 50 MG PO TABS
ORAL_TABLET | ORAL | 0 refills | Status: DC
  Filled 2022-09-24 (×2): qty 3, 2d supply, fill #0

## 2022-09-24 NOTE — Telephone Encounter (Signed)
Pre med instructions have been mailed and sent to My Chart.  Prescription has been sent to the pharmacy for prednisone 50 mg 3 tablets with no refills.

## 2022-09-24 NOTE — Telephone Encounter (Signed)
The pt has been advised that he is due for CT. He agrees and the order has been entered and sent to the schedulers    He is allergic to the contrast and has been mailed the pre med instructions.

## 2022-09-24 NOTE — Telephone Encounter (Signed)
-----   Message from Timothy Lasso, RN sent at 07/26/2022  9:46 AM EST ----- pancreas protocol CT abdomen in 2 months with a follow-up in clinic with me.

## 2022-10-01 ENCOUNTER — Other Ambulatory Visit: Payer: Self-pay

## 2022-10-06 ENCOUNTER — Encounter (HOSPITAL_COMMUNITY): Payer: Self-pay

## 2022-10-06 ENCOUNTER — Emergency Department (HOSPITAL_COMMUNITY): Payer: 59

## 2022-10-06 ENCOUNTER — Inpatient Hospital Stay (HOSPITAL_COMMUNITY)
Admission: EM | Admit: 2022-10-06 | Discharge: 2022-10-09 | DRG: 433 | Disposition: A | Discharge status: Home / Self Care | Payer: 59 | Attending: Internal Medicine | Admitting: Internal Medicine

## 2022-10-06 ENCOUNTER — Other Ambulatory Visit: Payer: Self-pay

## 2022-10-06 DIAGNOSIS — D62 Acute posthemorrhagic anemia: Secondary | ICD-10-CM | POA: Diagnosis not present

## 2022-10-06 DIAGNOSIS — R630 Anorexia: Secondary | ICD-10-CM | POA: Diagnosis present

## 2022-10-06 DIAGNOSIS — Z87891 Personal history of nicotine dependence: Secondary | ICD-10-CM

## 2022-10-06 DIAGNOSIS — Y900 Blood alcohol level of less than 20 mg/100 ml: Secondary | ICD-10-CM | POA: Diagnosis present

## 2022-10-06 DIAGNOSIS — K766 Portal hypertension: Secondary | ICD-10-CM | POA: Diagnosis present

## 2022-10-06 DIAGNOSIS — E119 Type 2 diabetes mellitus without complications: Secondary | ICD-10-CM

## 2022-10-06 DIAGNOSIS — Z7901 Long term (current) use of anticoagulants: Secondary | ICD-10-CM

## 2022-10-06 DIAGNOSIS — R195 Other fecal abnormalities: Secondary | ICD-10-CM | POA: Diagnosis present

## 2022-10-06 DIAGNOSIS — E876 Hypokalemia: Secondary | ICD-10-CM | POA: Diagnosis present

## 2022-10-06 DIAGNOSIS — S32049A Unspecified fracture of fourth lumbar vertebra, initial encounter for closed fracture: Secondary | ICD-10-CM | POA: Diagnosis present

## 2022-10-06 DIAGNOSIS — Z7984 Long term (current) use of oral hypoglycemic drugs: Secondary | ICD-10-CM

## 2022-10-06 DIAGNOSIS — K7469 Other cirrhosis of liver: Principal | ICD-10-CM

## 2022-10-06 DIAGNOSIS — K8020 Calculus of gallbladder without cholecystitis without obstruction: Secondary | ICD-10-CM | POA: Diagnosis present

## 2022-10-06 DIAGNOSIS — R12 Heartburn: Secondary | ICD-10-CM | POA: Diagnosis present

## 2022-10-06 DIAGNOSIS — N179 Acute kidney failure, unspecified: Secondary | ICD-10-CM | POA: Diagnosis present

## 2022-10-06 DIAGNOSIS — E7220 Disorder of urea cycle metabolism, unspecified: Secondary | ICD-10-CM | POA: Diagnosis present

## 2022-10-06 DIAGNOSIS — D6959 Other secondary thrombocytopenia: Secondary | ICD-10-CM | POA: Diagnosis present

## 2022-10-06 DIAGNOSIS — E871 Hypo-osmolality and hyponatremia: Secondary | ICD-10-CM | POA: Diagnosis present

## 2022-10-06 DIAGNOSIS — K862 Cyst of pancreas: Secondary | ICD-10-CM | POA: Diagnosis present

## 2022-10-06 DIAGNOSIS — Z833 Family history of diabetes mellitus: Secondary | ICD-10-CM

## 2022-10-06 DIAGNOSIS — R569 Unspecified convulsions: Secondary | ICD-10-CM | POA: Diagnosis present

## 2022-10-06 DIAGNOSIS — F1010 Alcohol abuse, uncomplicated: Secondary | ICD-10-CM | POA: Diagnosis present

## 2022-10-06 DIAGNOSIS — D649 Anemia, unspecified: Secondary | ICD-10-CM | POA: Diagnosis present

## 2022-10-06 DIAGNOSIS — Z79899 Other long term (current) drug therapy: Secondary | ICD-10-CM

## 2022-10-06 DIAGNOSIS — I8510 Secondary esophageal varices without bleeding: Secondary | ICD-10-CM | POA: Diagnosis present

## 2022-10-06 DIAGNOSIS — E8770 Fluid overload, unspecified: Secondary | ICD-10-CM | POA: Diagnosis present

## 2022-10-06 DIAGNOSIS — R531 Weakness: Secondary | ICD-10-CM

## 2022-10-06 DIAGNOSIS — I480 Paroxysmal atrial fibrillation: Secondary | ICD-10-CM | POA: Diagnosis present

## 2022-10-06 DIAGNOSIS — E11649 Type 2 diabetes mellitus with hypoglycemia without coma: Secondary | ICD-10-CM | POA: Diagnosis not present

## 2022-10-06 DIAGNOSIS — E440 Moderate protein-calorie malnutrition: Secondary | ICD-10-CM | POA: Diagnosis present

## 2022-10-06 DIAGNOSIS — K7010 Alcoholic hepatitis without ascites: Secondary | ICD-10-CM | POA: Diagnosis not present

## 2022-10-06 DIAGNOSIS — R0602 Shortness of breath: Secondary | ICD-10-CM

## 2022-10-06 DIAGNOSIS — X58XXXA Exposure to other specified factors, initial encounter: Secondary | ICD-10-CM | POA: Diagnosis present

## 2022-10-06 DIAGNOSIS — Z1152 Encounter for screening for COVID-19: Secondary | ICD-10-CM

## 2022-10-06 DIAGNOSIS — K219 Gastro-esophageal reflux disease without esophagitis: Secondary | ICD-10-CM | POA: Diagnosis present

## 2022-10-06 DIAGNOSIS — Z6822 Body mass index (BMI) 22.0-22.9, adult: Secondary | ICD-10-CM

## 2022-10-06 DIAGNOSIS — Z888 Allergy status to other drugs, medicaments and biological substances status: Secondary | ICD-10-CM

## 2022-10-06 DIAGNOSIS — K3189 Other diseases of stomach and duodenum: Secondary | ICD-10-CM | POA: Diagnosis present

## 2022-10-06 DIAGNOSIS — Z91041 Radiographic dye allergy status: Secondary | ICD-10-CM

## 2022-10-06 DIAGNOSIS — K7030 Alcoholic cirrhosis of liver without ascites: Secondary | ICD-10-CM | POA: Diagnosis present

## 2022-10-06 LAB — URINALYSIS, W/ REFLEX TO CULTURE (INFECTION SUSPECTED)
Glucose, UA: NEGATIVE mg/dL
Hgb urine dipstick: NEGATIVE
Ketones, ur: NEGATIVE mg/dL
Leukocytes,Ua: NEGATIVE
Nitrite: NEGATIVE
Protein, ur: 30 mg/dL — AB
Specific Gravity, Urine: 1.014 (ref 1.005–1.030)
pH: 6 (ref 5.0–8.0)

## 2022-10-06 LAB — CBC WITH DIFFERENTIAL/PLATELET
Abs Immature Granulocytes: 0.04 10*3/uL (ref 0.00–0.07)
Basophils Absolute: 0.1 10*3/uL (ref 0.0–0.1)
Basophils Relative: 1 %
Eosinophils Absolute: 0.1 10*3/uL (ref 0.0–0.5)
Eosinophils Relative: 1 %
HCT: 30.6 % — ABNORMAL LOW (ref 39.0–52.0)
Hemoglobin: 10.8 g/dL — ABNORMAL LOW (ref 13.0–17.0)
Immature Granulocytes: 0 %
Lymphocytes Relative: 16 %
Lymphs Abs: 1.5 10*3/uL (ref 0.7–4.0)
MCH: 29.6 pg (ref 26.0–34.0)
MCHC: 35.3 g/dL (ref 30.0–36.0)
MCV: 83.8 fL (ref 80.0–100.0)
Monocytes Absolute: 0.9 10*3/uL (ref 0.1–1.0)
Monocytes Relative: 10 %
Neutro Abs: 6.7 10*3/uL (ref 1.7–7.7)
Neutrophils Relative %: 72 %
Platelets: 66 10*3/uL — ABNORMAL LOW (ref 150–400)
RBC: 3.65 MIL/uL — ABNORMAL LOW (ref 4.22–5.81)
RDW: 24.3 % — ABNORMAL HIGH (ref 11.5–15.5)
WBC: 9.2 10*3/uL (ref 4.0–10.5)
nRBC: 0 % (ref 0.0–0.2)

## 2022-10-06 LAB — LIPASE, BLOOD: Lipase: 17 U/L (ref 11–51)

## 2022-10-06 LAB — COMPREHENSIVE METABOLIC PANEL
ALT: 31 U/L (ref 0–44)
AST: 90 U/L — ABNORMAL HIGH (ref 15–41)
Albumin: 2.9 g/dL — ABNORMAL LOW (ref 3.5–5.0)
Alkaline Phosphatase: 177 U/L — ABNORMAL HIGH (ref 38–126)
Anion gap: 15 (ref 5–15)
BUN: 7 mg/dL (ref 6–20)
CO2: 18 mmol/L — ABNORMAL LOW (ref 22–32)
Calcium: 8.7 mg/dL — ABNORMAL LOW (ref 8.9–10.3)
Chloride: 95 mmol/L — ABNORMAL LOW (ref 98–111)
Creatinine, Ser: 1.32 mg/dL — ABNORMAL HIGH (ref 0.61–1.24)
GFR, Estimated: >60 mL/min (ref >60)
Glucose, Bld: 210 mg/dL — ABNORMAL HIGH (ref 70–99)
Potassium: 3.2 mmol/L — ABNORMAL LOW (ref 3.5–5.1)
Sodium: 128 mmol/L — ABNORMAL LOW (ref 135–145)
Total Bilirubin: 19.1 mg/dL (ref 0.3–1.2)
Total Protein: 8.1 g/dL (ref 6.5–8.1)

## 2022-10-06 LAB — HEPATITIS PANEL, ACUTE
HCV Ab: NONREACTIVE
Hep A IgM: NONREACTIVE
Hep B C IgM: NONREACTIVE
Hepatitis B Surface Ag: NONREACTIVE

## 2022-10-06 LAB — ETHANOL: Alcohol, Ethyl (B): <10 mg/dL (ref <10)

## 2022-10-06 LAB — RAPID URINE DRUG SCREEN, HOSP PERFORMED
Amphetamines: NOT DETECTED
Barbiturates: NOT DETECTED
Benzodiazepines: NOT DETECTED
Cocaine: NOT DETECTED
Opiates: NOT DETECTED
Tetrahydrocannabinol: NOT DETECTED

## 2022-10-06 LAB — AMMONIA: Ammonia: 157 umol/L — ABNORMAL HIGH (ref 9–35)

## 2022-10-06 LAB — BRAIN NATRIURETIC PEPTIDE: B Natriuretic Peptide: 44.4 pg/mL (ref 0.0–100.0)

## 2022-10-06 LAB — PROTIME-INR
INR: 1.5 — ABNORMAL HIGH (ref 0.8–1.2)
Prothrombin Time: 18.3 seconds — ABNORMAL HIGH (ref 11.4–15.2)

## 2022-10-06 LAB — SARS CORONAVIRUS 2 BY RT PCR: SARS Coronavirus 2 by RT PCR: NEGATIVE

## 2022-10-06 LAB — ACETAMINOPHEN LEVEL: Acetaminophen (Tylenol), Serum: <10 ug/mL — ABNORMAL LOW (ref 10–30)

## 2022-10-06 LAB — TROPONIN I (HIGH SENSITIVITY)
Troponin I (High Sensitivity): 7 ng/L (ref <18)
Troponin I (High Sensitivity): 7 ng/L (ref <18)

## 2022-10-06 MED ORDER — SODIUM CHLORIDE 0.9 % IV BOLUS
500.0000 mL | Freq: Once | INTRAVENOUS | Status: AC
  Administered 2022-10-06: 500 mL via INTRAVENOUS

## 2022-10-06 MED ORDER — METOPROLOL SUCCINATE ER 25 MG PO TB24
25.0000 mg | ORAL_TABLET | Freq: Every evening | ORAL | Status: DC
  Administered 2022-10-07 – 2022-10-08 (×2): 25 mg via ORAL
  Filled 2022-10-06 (×2): qty 1

## 2022-10-06 MED ORDER — THIAMINE MONONITRATE 100 MG PO TABS
100.0000 mg | ORAL_TABLET | Freq: Every day | ORAL | Status: DC
  Administered 2022-10-07 – 2022-10-09 (×3): 100 mg via ORAL
  Filled 2022-10-06 (×3): qty 1

## 2022-10-06 MED ORDER — LEVETIRACETAM 500 MG PO TABS
500.0000 mg | ORAL_TABLET | Freq: Two times a day (BID) | ORAL | Status: DC
  Administered 2022-10-06 – 2022-10-09 (×6): 500 mg via ORAL
  Filled 2022-10-06 (×6): qty 1

## 2022-10-06 MED ORDER — PANTOPRAZOLE SODIUM 40 MG PO TBEC
40.0000 mg | DELAYED_RELEASE_TABLET | Freq: Two times a day (BID) | ORAL | Status: DC
  Administered 2022-10-06 – 2022-10-09 (×6): 40 mg via ORAL
  Filled 2022-10-06 (×6): qty 1

## 2022-10-06 MED ORDER — FOLIC ACID 1 MG PO TABS
1.0000 mg | ORAL_TABLET | Freq: Every day | ORAL | Status: DC
  Administered 2022-10-07 – 2022-10-09 (×3): 1 mg via ORAL
  Filled 2022-10-06 (×3): qty 1

## 2022-10-06 MED ORDER — GABAPENTIN 300 MG PO CAPS
300.0000 mg | ORAL_CAPSULE | Freq: Every day | ORAL | Status: DC
  Administered 2022-10-06 – 2022-10-08 (×3): 300 mg via ORAL
  Filled 2022-10-06 (×3): qty 1

## 2022-10-06 MED ORDER — LACTULOSE 10 GM/15ML PO SOLN
20.0000 g | Freq: Three times a day (TID) | ORAL | Status: DC
  Administered 2022-10-07 – 2022-10-09 (×8): 20 g via ORAL
  Filled 2022-10-06 (×9): qty 30

## 2022-10-06 MED ORDER — APIXABAN 5 MG PO TABS
5.0000 mg | ORAL_TABLET | Freq: Two times a day (BID) | ORAL | Status: DC
  Administered 2022-10-06: 5 mg via ORAL
  Filled 2022-10-06: qty 1

## 2022-10-06 MED ORDER — POTASSIUM CHLORIDE CRYS ER 20 MEQ PO TBCR
20.0000 meq | EXTENDED_RELEASE_TABLET | Freq: Two times a day (BID) | ORAL | Status: DC
  Administered 2022-10-06 – 2022-10-09 (×6): 20 meq via ORAL
  Filled 2022-10-06 (×6): qty 1

## 2022-10-06 MED ORDER — ATORVASTATIN CALCIUM 10 MG PO TABS
20.0000 mg | ORAL_TABLET | Freq: Every day | ORAL | Status: DC
  Administered 2022-10-07 – 2022-10-09 (×3): 20 mg via ORAL
  Filled 2022-10-06 (×3): qty 2

## 2022-10-06 NOTE — ED Notes (Signed)
Pt care taken, resting waiting for results. No complaints. Iv fluids are finished.

## 2022-10-06 NOTE — Subjective & Objective (Signed)
Gary Mitchell, a 52 y/o with h/o alcoholic cirrhosis with portal hypertension, varices and gastropathy with recent EGD Dec'23 revealing erosions, hyperammonemia, DM2, PAF on Eliquis. For the past week he has been having N/V with decreased PO intake, increased weakness and marked DOE. He denies fever, chills, hematemesis, does endorse intermittent dark stools, denies chest pain, cough. Of note he had a 2D echo 09/13/22 that was normal. Due to his persistent symptoms he presents to [facility] for evaluation.

## 2022-10-06 NOTE — ED Notes (Signed)
Pt going to ct  

## 2022-10-06 NOTE — ED Provider Notes (Signed)
Emergency Department Provider Note   I have reviewed the triage vital signs and the nursing notes.   HISTORY  Chief Complaint Shortness of Breath   HPI Gary Mitchell is a 52 y.o. male with past history of cirrhosis, diabetes, seizure disorder, alcohol abuse presents to emergency department with shortness of breath, vomiting, weakness.  He has had symptoms for the last week.  Denies abdominal pain but multiple episodes of vomiting.  No chest pain.  He is feeling shortness of breath, especially with exertion.  He states he typically can walk 3 miles a day without difficulty but is having difficulty walking 50 feet or so without needing a break.  Has not noticed significant increase of his jaundice.  No significant leg swelling or pain. Has had similar symptoms with anemia in the past.   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 Positive weakness.  Cardiovascular: Denies chest pain. Respiratory: Positive shortness of breath. Gastrointestinal: No abdominal pain.  Positive nausea and  vomiting.  No diarrhea.  No constipation. Genitourinary: Negative for dysuria. Musculoskeletal: Negative for back pain. Skin: Negative for rash. Neurological: Negative for headaches.  ____________________________________________   PHYSICAL EXAM:  VITAL SIGNS: ED Triage  Vitals  Enc Vitals Group     BP --      Pulse Rate 10/06/22 1745 93     Resp --      Temp 10/06/22 1745 98.2 F (36.8 C)     Temp src --      SpO2 10/06/22 1745 99 %   Constitutional: Alert and oriented. Well appearing and in no acute distress. Eyes: Conjunctivae are normal. Icteric sclera.  Head: Atraumatic. Nose: No congestion/rhinnorhea. Mouth/Throat: Mucous membranes are moist. Neck: No stridor.  Cardiovascular: Normal rate, regular rhythm. Good peripheral circulation. Grossly normal heart sounds.   Respiratory: Normal respiratory effort.  No retractions. Lungs CTAB. No wheezing.  Gastrointestinal: Soft and nontender. No distention.  Musculoskeletal: No lower extremity tenderness nor edema. No gross deformities of extremities. Neurologic:  Normal speech and language. No gross focal neurologic deficits are appreciated.  Skin:  Skin is warm, dry and intact. No rash noted.  ____________________________________________   LABS (all labs ordered are listed, but only abnormal results are displayed)  Labs Reviewed  COMPREHENSIVE METABOLIC PANEL - Abnormal; Notable for the following components:      Result Value   Sodium 128 (*)    Potassium 3.2 (*)    Chloride 95 (*)    CO2 18 (*)    Glucose, Bld 210 (*)    Creatinine, Ser 1.32 (*)    Calcium 8.7 (*)    Albumin 2.9 (*)    AST 90 (*)    Alkaline Phosphatase 177 (*)    Total Bilirubin 19.1 (*)    All other components within  normal limits  CBC WITH DIFFERENTIAL/PLATELET - Abnormal; Notable for the following components:   RBC 3.65 (*)    Hemoglobin 10.8 (*)    HCT 30.6 (*)    RDW 24.3 (*)    Platelets 66 (*)    All other components within normal limits  PROTIME-INR - Abnormal; Notable for the following components:   Prothrombin Time 18.3 (*)    INR 1.5 (*)    All other components within normal limits  URINALYSIS, W/ REFLEX TO CULTURE (INFECTION SUSPECTED) - Abnormal; Notable for the following components:   Color, Urine  AMBER (*)    APPearance HAZY (*)    Bilirubin Urine MODERATE (*)    Protein, ur 30 (*)    Bacteria, UA FEW (*)    All other components within normal limits  AMMONIA - Abnormal; Notable for the following components:   Ammonia 157 (*)    All other components within normal limits  ACETAMINOPHEN LEVEL - Abnormal; Notable for the following components:   Acetaminophen (Tylenol), Serum <10 (*)    All other components within normal limits  SARS CORONAVIRUS 2 BY RT PCR  LIPASE, BLOOD  BRAIN NATRIURETIC PEPTIDE  ETHANOL  RAPID URINE DRUG SCREEN, HOSP PERFORMED  HEPATITIS PANEL, ACUTE  TROPONIN I (HIGH SENSITIVITY)  TROPONIN I (HIGH SENSITIVITY)   ____________________________________________  EKG   EKG Interpretation  Date/Time:  Sunday October 06 2022 18:41:54 EDT Ventricular Rate:  93 PR Interval:  162 QRS Duration: 99 QT Interval:  395 QTC Calculation: 492 R Axis:   30 Text Interpretation: Sinus rhythm Borderline prolonged QT interval Confirmed by Nanda Quinton [phone]) on 10/06/2022 6:54:59 PM        ____________________________________________  RADIOLOGY  CT ABDOMEN PELVIS WO CONTRAST  Result Date: 10/06/2022 CLINICAL DATA:  One-week history of nausea, vomiting, and shortness of breath EXAM: CT ABDOMEN AND PELVIS WITHOUT CONTRAST TECHNIQUE: Multidetector CT imaging of the abdomen and pelvis was performed following the standard protocol without IV contrast. RADIATION DOSE REDUCTION: This exam was performed according to the departmental dose-optimization program which includes automated exposure control, adjustment of the mA and/or kV according to patient size and/or use of iterative reconstruction technique. COMPARISON:  CT abdomen and pelvis dated 07/18/2022 FINDINGS: Lower chest: No focal consolidation or pulmonary nodule in the lung bases. No pleural effusion or pneumothorax demonstrated. Partially imaged heart size is normal. Hepatobiliary: Cirrhotic morphology. No focal hepatic  lesions. No intra or extrahepatic biliary ductal dilation. Cholelithiasis. Pancreas: Essentially unchanged 4.5 x 4.0 cm cystic focus in the pancreatic body with upstream pancreatic ductal dilation, not well evaluated on this noncontrast enhanced examination. Spleen: Normal in size without focal abnormality. Adrenals/Urinary Tract: No adrenal nodules. Left kidney is absent. No suspicious renal mass, calculi or hydronephrosis. No focal bladder wall thickening. Stomach/Bowel: Normal appearance of the stomach. Endoscopy clip is again seen within the duodenum. No evidence of bowel wall thickening, distention, or inflammatory changes. Normal appendix. Vascular/Lymphatic: Aortic atherosclerosis. 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 No enlarged abdominal or pelvic lymph nodes. Reproductive: Prostate is unremarkable. Other: No free fluid, fluid collection, or free air. Musculoskeletal: No acute or abnormal lytic or blastic osseous lesions. IMPRESSION: 1. No acute abdominopelvic findings. 2. Cirrhosis with sequela of portal hypertension including recanalized paraumbilical vein and caput medusae varices. 3. Essentially unchanged 4.5 cm cystic focus in the pancreatic body with upstream pancreatic ductal dilation, not well evaluated on this noncontrast enhanced examination. 4.  Aortic Atherosclerosis (ICD10-I70.0). Electronically Signed   By: Britt Bottom  Xu M.D.   On: 10/06/2022 20:49   DG Chest 2 View  Result Date: 10/06/2022 CLINICAL DATA:  Shortness of breath. EXAM: CHEST - 2 VIEW COMPARISON:  07/29/2022 FINDINGS: Stable upper normal heart size.The cardiomediastinal contours are normal. The lungs are clear. Pulmonary vasculature is normal. No consolidation, pleural effusion, or pneumothorax. Chronic midthoracic compression deformities. No acute osseous abnormalities are seen. IMPRESSION: No acute chest findings. Electronically Signed   By: Keith Rake M.D.   On: 10/06/2022 18:40     ____________________________________________   PROCEDURES  Procedure(s) performed:   Procedures  None  ____________________________________________   INITIAL IMPRESSION / ASSESSMENT AND PLAN / ED COURSE  Pertinent labs & imaging results that were available during my care of the patient were reviewed by me and considered in my medical decision making (see chart for details).   This patient is Presenting for Evaluation of SOB, which does require a range of treatment options, and is a complaint that involves a high risk of morbidity and mortality.  The Differential Diagnoses include CHF, PE, ACS, CAP, COVID, etc.  Critical Interventions-    Medications  atorvastatin (LIPITOR) tablet 20 mg (has no administration in time range)  metoprolol succinate (TOPROL-XL) 24 hr tablet 25 mg (has no administration in time range)  lactulose (CHRONULAC) 10 GM/15ML solution 20 g (has no administration in time range)  pantoprazole (PROTONIX) EC tablet 40 mg (has no administration in time range)  apixaban (ELIQUIS) tablet 5 mg (has no administration in time range)  folic acid (FOLVITE) tablet 1 mg (has no administration in time range)  gabapentin (NEURONTIN) capsule 300 mg (has no administration in time range)  levETIRAcetam (KEPPRA) tablet 500 mg (has no administration in time range)  potassium chloride SA (KLOR-CON M) CR tablet 20 mEq (has no administration in time range)  thiamine (VITAMIN B1) tablet 100 mg (has no administration in time range)  sodium chloride 0.9 % bolus 500 mL (0 mLs Intravenous Stopped 10/06/22 1917)    Reassessment after intervention:  Symptoms unchanged.   I decided to review pertinent External Data, and in summary patient with known EtOH cirrhosis. Patient is anticoagulated for A fib.    Clinical Laboratory Tests Ordered, included   Radiologic Tests Ordered, included CXR. I independently interpreted the images and agree with radiology interpretation.   Cardiac  Monitor Tracing which shows NSR.    Social Determinants of Health Risk patient continues to drink EtOH twice per week. No smoking history.   Consult complete with Gastroenterology ([place]) Dr. Candis Schatz. Team will round on him in the AM. No acute interventions/steroids tonight.   TRH. Plan for admit.   Medical Decision Making: Summary:  Patient presents to the emergency department with shortness of breath, weakness, vomiting.  Arrives afebrile with normal vital signs including O2 sat on room air.  He is breathing comfortably.  Has had issues with anemia in the past but no symptoms of GI bleeding.  Plan for broad workup and chest x-ray. Abdomen is diffusely soft and non-tender.   Reevaluation with update and discussion with patient. Discussed labs and plan for admit. He is in agreement.   Patient's presentation is most consistent with acute presentation with potential threat to life or bodily function.   Disposition: admit  ____________________________________________  FINAL CLINICAL IMPRESSION(S) / ED DIAGNOSES  Final diagnoses:  Decompensated liver disease ([place])  SOB (shortness of breath)  Generalized weakness    Note:  This document was prepared using Dragon voice recognition software and may include unintentional  dictation errors.  Nanda Quinton, MD, [facility] Emergency Medicine    Neoma Uhrich, [facility], MD 10/06/22 [phone]

## 2022-10-06 NOTE — ED Triage Notes (Signed)
Pt BIBGEMS for SOB with N/V for 1 week. Pt alert and oriented, VSS, speaking in full sentences. Pt jaundice on assessment.   Hx of seizures  126/74 104 hr 99% RA 150 CBG

## 2022-10-06 NOTE — ED Notes (Signed)
Pt transported to XR.  

## 2022-10-07 ENCOUNTER — Encounter (HOSPITAL_COMMUNITY): Payer: Self-pay | Admitting: Internal Medicine

## 2022-10-07 DIAGNOSIS — S32049A Unspecified fracture of fourth lumbar vertebra, initial encounter for closed fracture: Secondary | ICD-10-CM | POA: Diagnosis present

## 2022-10-07 DIAGNOSIS — I480 Paroxysmal atrial fibrillation: Secondary | ICD-10-CM | POA: Diagnosis present

## 2022-10-07 DIAGNOSIS — F1010 Alcohol abuse, uncomplicated: Secondary | ICD-10-CM | POA: Diagnosis present

## 2022-10-07 DIAGNOSIS — D6959 Other secondary thrombocytopenia: Secondary | ICD-10-CM | POA: Diagnosis present

## 2022-10-07 DIAGNOSIS — Z1152 Encounter for screening for COVID-19: Secondary | ICD-10-CM | POA: Diagnosis not present

## 2022-10-07 DIAGNOSIS — N179 Acute kidney failure, unspecified: Secondary | ICD-10-CM | POA: Diagnosis present

## 2022-10-07 DIAGNOSIS — Y900 Blood alcohol level of less than 20 mg/100 ml: Secondary | ICD-10-CM | POA: Diagnosis present

## 2022-10-07 DIAGNOSIS — D649 Anemia, unspecified: Secondary | ICD-10-CM | POA: Diagnosis not present

## 2022-10-07 DIAGNOSIS — E7220 Disorder of urea cycle metabolism, unspecified: Secondary | ICD-10-CM

## 2022-10-07 DIAGNOSIS — R630 Anorexia: Secondary | ICD-10-CM

## 2022-10-07 DIAGNOSIS — K862 Cyst of pancreas: Secondary | ICD-10-CM | POA: Diagnosis present

## 2022-10-07 DIAGNOSIS — X58XXXA Exposure to other specified factors, initial encounter: Secondary | ICD-10-CM | POA: Diagnosis present

## 2022-10-07 DIAGNOSIS — K869 Disease of pancreas, unspecified: Secondary | ICD-10-CM

## 2022-10-07 DIAGNOSIS — K8020 Calculus of gallbladder without cholecystitis without obstruction: Secondary | ICD-10-CM | POA: Diagnosis present

## 2022-10-07 DIAGNOSIS — K7010 Alcoholic hepatitis without ascites: Secondary | ICD-10-CM

## 2022-10-07 DIAGNOSIS — E876 Hypokalemia: Secondary | ICD-10-CM | POA: Diagnosis present

## 2022-10-07 DIAGNOSIS — K219 Gastro-esophageal reflux disease without esophagitis: Secondary | ICD-10-CM | POA: Diagnosis present

## 2022-10-07 DIAGNOSIS — D62 Acute posthemorrhagic anemia: Secondary | ICD-10-CM

## 2022-10-07 DIAGNOSIS — E11649 Type 2 diabetes mellitus with hypoglycemia without coma: Secondary | ICD-10-CM | POA: Diagnosis not present

## 2022-10-07 DIAGNOSIS — E8770 Fluid overload, unspecified: Secondary | ICD-10-CM | POA: Diagnosis present

## 2022-10-07 DIAGNOSIS — K766 Portal hypertension: Secondary | ICD-10-CM | POA: Diagnosis present

## 2022-10-07 DIAGNOSIS — R112 Nausea with vomiting, unspecified: Secondary | ICD-10-CM

## 2022-10-07 DIAGNOSIS — R195 Other fecal abnormalities: Secondary | ICD-10-CM | POA: Diagnosis present

## 2022-10-07 DIAGNOSIS — K3189 Other diseases of stomach and duodenum: Secondary | ICD-10-CM | POA: Diagnosis present

## 2022-10-07 DIAGNOSIS — R12 Heartburn: Secondary | ICD-10-CM | POA: Diagnosis present

## 2022-10-07 DIAGNOSIS — I4891 Unspecified atrial fibrillation: Secondary | ICD-10-CM

## 2022-10-07 DIAGNOSIS — Z419 Encounter for procedure for purposes other than remedying health state, unspecified: Secondary | ICD-10-CM | POA: Diagnosis not present

## 2022-10-07 DIAGNOSIS — E119 Type 2 diabetes mellitus without complications: Secondary | ICD-10-CM

## 2022-10-07 DIAGNOSIS — R569 Unspecified convulsions: Secondary | ICD-10-CM | POA: Diagnosis present

## 2022-10-07 DIAGNOSIS — K7030 Alcoholic cirrhosis of liver without ascites: Secondary | ICD-10-CM | POA: Diagnosis present

## 2022-10-07 DIAGNOSIS — E871 Hypo-osmolality and hyponatremia: Secondary | ICD-10-CM | POA: Diagnosis present

## 2022-10-07 DIAGNOSIS — I8510 Secondary esophageal varices without bleeding: Secondary | ICD-10-CM | POA: Diagnosis present

## 2022-10-07 LAB — BASIC METABOLIC PANEL
Anion gap: 14 (ref 5–15)
BUN: 6 mg/dL (ref 6–20)
CO2: 19 mmol/L — ABNORMAL LOW (ref 22–32)
Calcium: 8.5 mg/dL — ABNORMAL LOW (ref 8.9–10.3)
Chloride: 98 mmol/L (ref 98–111)
Creatinine, Ser: 0.66 mg/dL (ref 0.61–1.24)
GFR, Estimated: >60 mL/min (ref >60)
Glucose, Bld: 68 mg/dL — ABNORMAL LOW (ref 70–99)
Potassium: 3.1 mmol/L — ABNORMAL LOW (ref 3.5–5.1)
Sodium: 131 mmol/L — ABNORMAL LOW (ref 135–145)

## 2022-10-07 LAB — GLUCOSE, CAPILLARY
Glucose-Capillary: 122 mg/dL — ABNORMAL HIGH (ref 70–99)
Glucose-Capillary: 60 mg/dL — ABNORMAL LOW (ref 70–99)
Glucose-Capillary: 71 mg/dL (ref 70–99)
Glucose-Capillary: 69 mg/dL — ABNORMAL LOW (ref 70–99)
Glucose-Capillary: 131 mg/dL — ABNORMAL HIGH (ref 70–99)
Glucose-Capillary: 89 mg/dL (ref 70–99)
Glucose-Capillary: 196 mg/dL — ABNORMAL HIGH (ref 70–99)
Glucose-Capillary: 118 mg/dL — ABNORMAL HIGH (ref 70–99)

## 2022-10-07 LAB — HEMOGLOBIN AND HEMATOCRIT, BLOOD
HCT: 32.2 % — ABNORMAL LOW (ref 39.0–52.0)
HCT: 30.1 % — ABNORMAL LOW (ref 39.0–52.0)
Hemoglobin: 11 g/dL — ABNORMAL LOW (ref 13.0–17.0)
Hemoglobin: 10.5 g/dL — ABNORMAL LOW (ref 13.0–17.0)

## 2022-10-07 LAB — MAGNESIUM: Magnesium: 1.1 mg/dL — ABNORMAL LOW (ref 1.7–2.4)

## 2022-10-07 LAB — CBG MONITORING, ED: Glucose-Capillary: 183 mg/dL — ABNORMAL HIGH (ref 70–99)

## 2022-10-07 MED ORDER — RIFAXIMIN 550 MG PO TABS
550.0000 mg | ORAL_TABLET | Freq: Two times a day (BID) | ORAL | Status: DC
  Administered 2022-10-07 – 2022-10-09 (×6): 550 mg via ORAL
  Filled 2022-10-07 (×6): qty 1

## 2022-10-07 MED ORDER — TRAZODONE HCL 50 MG PO TABS: 25.0000 mg | ORAL_TABLET | Freq: Every evening | ORAL | Status: DC | PRN

## 2022-10-07 MED ORDER — PREDNISOLONE 5 MG PO TABS
40.0000 mg | ORAL_TABLET | Freq: Every day | ORAL | Status: DC
  Administered 2022-10-07 – 2022-10-08 (×2): 40 mg via ORAL
  Filled 2022-10-07 (×2): qty 8

## 2022-10-07 MED ORDER — ACETAMINOPHEN 650 MG RE SUPP: 650.0000 mg | Freq: Four times a day (QID) | RECTAL | Status: DC | PRN

## 2022-10-07 MED ORDER — INSULIN GLARGINE-YFGN 100 UNIT/ML ~~LOC~~ SOLN
10.0000 [IU] | Freq: Every day | SUBCUTANEOUS | Status: DC
  Administered 2022-10-07: 10 [IU] via SUBCUTANEOUS
  Filled 2022-10-07 (×2): qty 0.1

## 2022-10-07 MED ORDER — INSULIN GLARGINE-YFGN 100 UNIT/ML ~~LOC~~ SOLN
5.0000 [IU] | Freq: Every day | SUBCUTANEOUS | Status: DC
  Filled 2022-10-07 (×2): qty 0.05

## 2022-10-07 MED ORDER — POTASSIUM CHLORIDE IN NACL 20-0.45 MEQ/L-% IV SOLN
INTRAVENOUS | Status: DC
  Filled 2022-10-07 (×2): qty 1000

## 2022-10-07 MED ORDER — DEXTROSE 50 % IV SOLN
12.5000 g | INTRAVENOUS | Status: AC
  Administered 2022-10-07: 12.5 g via INTRAVENOUS
  Filled 2022-10-07: qty 50

## 2022-10-07 MED ORDER — INSULIN ASPART 100 UNIT/ML IJ SOLN
0.0000 [IU] | INTRAMUSCULAR | Status: DC
  Administered 2022-10-07: 2 [IU] via SUBCUTANEOUS
  Administered 2022-10-08: 3 [IU] via SUBCUTANEOUS
  Administered 2022-10-08: 5 [IU] via SUBCUTANEOUS

## 2022-10-07 MED ORDER — LORAZEPAM 2 MG/ML IJ SOLN: 1.0000 mg | INTRAMUSCULAR | Status: DC | PRN

## 2022-10-07 MED ORDER — LORAZEPAM 1 MG PO TABS: 1.0000 mg | ORAL_TABLET | ORAL | Status: DC | PRN

## 2022-10-07 MED ORDER — ADULT MULTIVITAMIN W/MINERALS CH
1.0000 | ORAL_TABLET | Freq: Every day | ORAL | Status: DC
  Administered 2022-10-07 – 2022-10-09 (×3): 1 via ORAL
  Filled 2022-10-07 (×3): qty 1

## 2022-10-07 MED ORDER — INSULIN ASPART 100 UNIT/ML IJ SOLN
0.0000 [IU] | INTRAMUSCULAR | Status: DC
  Administered 2022-10-07: 3 [IU] via SUBCUTANEOUS

## 2022-10-07 MED ORDER — ACETAMINOPHEN 325 MG PO TABS
650.0000 mg | ORAL_TABLET | Freq: Four times a day (QID) | ORAL | Status: DC | PRN
  Filled 2022-10-07: qty 2

## 2022-10-07 NOTE — Assessment & Plan Note (Signed)
NH3 157. He reports taking lactulose most of the time.  Plan Continue lactulose TID  Consider adding Xifaxan if ok with GI

## 2022-10-07 NOTE — Consult Note (Addendum)
Consultation  Referring Provider: Dr. Horris Latino    Primary Care Physician:  Charlott Rakes, MD Primary Gastroenterologist: Dr. Rush Landmark     Reason for Consultation: History of alcoholic cirrhosis, hyperbilirubinemia, anorexia, nausea and vomiting            HPI:   Gary Mitchell is a 52 y.o. male with a past medical history of alcoholic cirrhosis with portal hypertension, varices and gastropathy with recent EGD December 23 revealing erosions, hyperammonemia, diabetes type 2, PAF on Eliquis, who presented to the ED with a week of nausea vomiting and decreased p.o. intake with increased weakness and marked dyspnea on exertion.    At time of admission patient describes some intermittent dark stools.    Today, the patient is a poor historian but explains that over the past week he was starting to be unable to walk due to shortness of breath.  Tells me that on Monday, 09/30/2022 morning he started noticing this, apparently usually walks 6 miles in a week but he could not even walk 10 feet he also started to get dizzy when standing up.  This continued and over the next few days he developed some nausea and vomiting which "lasted all day and all night" for about 4 days off-and-on.  Tells me he was not eating during that time.  Does admit to drinking a pint of alcohol this past Saturday, that was his last ingestion.  Also describes some "dark but not black" stools off and on which have seemed to come and go over the past few months.  He does use his Lactulose typically 3 times a day except for 2 days a week when he needs to walk somewhere.  Tells me when he takes his medicine he is in the bathroom every 15 to 20 minutes.  Tells me he is very hungry now.    Denies fever, chills, abdominal pain or weight loss.  ED course: Sodium 128, potassium 3.2, glucose 210, creatinine 1.32, T. bili 19.1, AST 90, INR 1.5, platelets 66, hemoglobin 10.8 ,CTAP with no acute findings, cirrhosis, portal hypertension and  persistent pancreatic cyst  GI history: 06/13/2022 EUS/EGD: EGD with portal hypertensive gastropathy, erosive gastropathy with active oozing noted from one of the erosions distally, treated with a snare spray coagulation, duodenal Hemoclip found in sweep, congested duodenal mucosa at the area surrounding and otherwise normal-appearing major papula; EUS with a cystic lesion in the pancreatic body connected to the main pancreatic duct and leads to dilation upstream, cytology results are pending, suspicious for intraductal papillary mucinous neoplasm, FNA performed, atrophy in the entire pancreas; biopsies with no malignant cells identified 05/11/2022 enteroscopy with small less than 5 mm esophageal varices, mild portal hypertensive gastropathy, nonbleeding gastric ulcers with adherent clot, nonbleeding duodenal ulcers with adherent clot with clips placed, ulcers in the stomach duodenum or from mucosa biopsies taken during EGD October 31 are still not a clear explanation for his presenting anemia 05/09/22 pill capsule endoscopy with red blood and clot found in the stomach, fresh blood seen at the pylorus, copious red fresh blood in the duodenum, blood throughout the small intestine, blood in the cecum 05/08/2022 colonoscopy with only fair preparation of the colon, blood in the entire colon, one 8 mm polyp in the descending colon removed, not bleeding from the small intestine, recommended pill capsule endoscopy 05/07/2022 EGD with grade 1 esophageal varices, acquired deformity in the pylorus, mild portal hypertensive gastropathy, congested duodenal mucosa 05/03/2022 office visit with Vicie Mutters: At  that time continued to drink, jaundice on exam with asterixis.  Known history of alcoholic cirrhosis with portal hypertension, recommended lactulose, scheduled CT pancreatic protocol and a CTA 19 9 01/31/2022 EUS with esophageal varices and mild gastropathy as well as thickening of the pancreas but no mass 11/2021  MRI: Pancreatic cystic lesion  Past Medical History:  Diagnosis Date   Alcoholic hepatitis [id number]   Anemia    Cirrhosis with alcoholism 11/2019   Coagulopathy    Attributed to liver disease/cirrhosis   Diabetes mellitus without complication    type 2   Neuromuscular disorder    neuropathy  feet legs   Pancreatic lesion 11/2019   Initially concerning for neoplasm but improved appearance on MRI 12/2019 at which time pseudocyst was most likely diagnosis.   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   Seizures    Thrombocytopenia     Past Surgical History:  Procedure Laterality Date   BIOPSY  08/02/2021   Procedure: BIOPSY;  Surgeon: Milus Banister, MD;  Location: [facility];  Service: Endoscopy;;   BIOPSY  05/07/2022   Procedure: BIOPSY;  Surgeon: [name], MD;  Location: [facility];  Service: Gastroenterology;;   COLONOSCOPY WITH PROPOFOL N/A 05/08/2022   Procedure: COLONOSCOPY WITH PROPOFOL;  Surgeon: [name], MD;  Location: [facility];  Service: Gastroenterology;  Laterality: N/A;   ENTEROSCOPY N/A 09/20/2020   Procedure: ENTEROSCOPY;  Surgeon: Thornton Park, MD;  Location: [place];  Service: Gastroenterology;  Laterality: N/A;   ENTEROSCOPY N/A 05/11/2022   Procedure: ENTEROSCOPY;  Surgeon: [name], MD;  Location: [facility];  Service: Gastroenterology;  Laterality: N/A;   ESOPHAGOGASTRODUODENOSCOPY (EGD) WITH PROPOFOL N/A 08/02/2021   Procedure: ESOPHAGOGASTRODUODENOSCOPY (EGD) WITH PROPOFOL;  Surgeon: Milus Banister, MD;  Location: [facility];  Service: Endoscopy;  Laterality: N/A;   ESOPHAGOGASTRODUODENOSCOPY (EGD) WITH PROPOFOL N/A 01/31/2022   Procedure: ESOPHAGOGASTRODUODENOSCOPY (EGD) WITH PROPOFOL;  Surgeon: Milus Banister, MD;  Location: [facility];  Service: Gastroenterology;  Laterality: N/A;   ESOPHAGOGASTRODUODENOSCOPY (EGD) WITH PROPOFOL  N/A 05/07/2022   Procedure: ESOPHAGOGASTRODUODENOSCOPY (EGD) WITH PROPOFOL;  Surgeon: [name], MD;  Location: [facility];  Service: Gastroenterology;  Laterality: N/A;   ESOPHAGOGASTRODUODENOSCOPY (EGD) WITH PROPOFOL N/A 06/13/2022   Procedure: ESOPHAGOGASTRODUODENOSCOPY (EGD) WITH PROPOFOL;  Surgeon: Rush Landmark Telford Nab., MD;  Location: [facility];  Service: Gastroenterology;  Laterality: N/A;   EUS N/A 01/31/2022   Procedure: UPPER ENDOSCOPIC ULTRASOUND (EUS) RADIAL;  Surgeon: Milus Banister, MD;  Location: [facility];  Service: Gastroenterology;  Laterality: N/A;   EUS N/A 06/13/2022   Procedure: UPPER ENDOSCOPIC ULTRASOUND (EUS) LINEAR;  Surgeon: Irving Copas., MD;  Location: [facility];  Service: Gastroenterology;  Laterality: N/A;   FINE NEEDLE ASPIRATION N/A 08/02/2021   Procedure: FINE NEEDLE ASPIRATION (FNA) LINEAR;  Surgeon: Milus Banister, MD;  Location: [facility];  Service: Endoscopy;  Laterality: N/A;   FINE NEEDLE ASPIRATION N/A 01/31/2022   Procedure: FINE NEEDLE ASPIRATION (FNA) LINEAR;  Surgeon: Milus Banister, MD;  Location: [facility];  Service: Gastroenterology;  Laterality: N/A;   FINE NEEDLE ASPIRATION N/A 06/13/2022   Procedure: FINE NEEDLE ASPIRATION (FNA) LINEAR;  Surgeon: Irving Copas., MD;  Location: [facility];  Service: Gastroenterology;  Laterality: N/A;   GIVENS CAPSULE STUDY N/A 05/09/2022   Procedure: GIVENS CAPSULE STUDY;  Surgeon: [name], MD;  Location: [facility];  Service: Gastroenterology;  Laterality: N/A;   HEMOSTASIS CLIP PLACEMENT  05/08/2022   Procedure: HEMOSTASIS CLIP PLACEMENT;  Surgeon: [name], MD;  Location: [facility];  Service: Gastroenterology;;   HEMOSTASIS CLIP PLACEMENT  05/11/2022   Procedure: HEMOSTASIS CLIP PLACEMENT;  Surgeon: [name], MD;  Location: [facility];  Service: Gastroenterology;;   HOT HEMOSTASIS N/A 06/13/2022   Procedure: HOT HEMOSTASIS  (ARGON PLASMA COAGULATION/BICAP);  Surgeon: Irving Copas., MD;  Location: [facility];  Service: Gastroenterology;  Laterality: N/A;   left kidney removed     POLYPECTOMY  05/08/2022   Procedure: POLYPECTOMY;  Surgeon: [name], MD;  Location: [facility];  Service: Gastroenterology;;   UPPER ESOPHAGEAL ENDOSCOPIC ULTRASOUND (EUS) N/A 08/02/2021   Procedure: UPPER ESOPHAGEAL ENDOSCOPIC ULTRASOUND (EUS);  Surgeon: Milus Banister, MD;  Location: [facility];  Service: Endoscopy;  Laterality: N/A;  Radial and Linear    Family History  Problem Relation Age of Onset   Diabetes Mellitus II Mother    Colon cancer Neg Hx    Esophageal cancer Neg Hx     Social History   Tobacco Use   Smoking status: Former    Packs/day: 0.10    Years: 25.00    Additional pack years: 0.00    Total pack years: 2.50    Types: Cigarettes    Quit date: 2019    Years since quitting: 5.2   Smokeless tobacco: Never   Tobacco comments:    Former smoker 08/08/22  Vaping Use   Vaping Use: Never used  Substance Use Topics   Alcohol use: Yes    Alcohol/week: 10.0 standard drinks of alcohol    Types: 10 Shots of liquor per week   Drug use: Not Currently    Types: Marijuana    Prior to Admission medications   Medication Sig Start Date End Date Taking? Authorizing Provider  apixaban (ELIQUIS) 5 MG TABS tablet Take 1 tablet (5 mg total) by mouth 2 (two) times daily. 07/30/22 10/06/22 Yes Wynona Dove A, DO  atorvastatin (LIPITOR) 20 MG tablet Take 1 tablet (20 mg total) by mouth daily. 05/29/21  Yes Charlott Rakes, MD  folic acid (FOLVITE) 1 MG tablet Take 1 tablet (1 mg total) by mouth daily. 05/13/22  Yes Hosie Poisson, MD  gabapentin (NEURONTIN) 300 MG capsule Take 1 capsule (300 mg total) by mouth at bedtime. 11/20/21  Yes Charlott Rakes, MD  glipiZIDE (GLUCOTROL) 5 MG tablet Take 1 tablet (5 mg total) by mouth 2 (two) times daily before a meal. 08/05/22  Yes Newlin, Enobong, MD   lactulose (CHRONULAC) 10 GM/15ML solution Take 30 mLs (20 g total) by mouth 3 (three) times daily. 05/12/22  Yes Hosie Poisson, MD  levETIRAcetam (KEPPRA) 500 MG tablet Take 1 tablet (500 mg total) by mouth 2 (two) times daily. 07/10/22 10/19/22 Yes Charlott Rakes, MD  metFORMIN (GLUCOPHAGE) 500 MG tablet Take 2 tablets (1,000 mg total) by mouth 2 (two) times daily with a meal. 08/05/22  Yes Newlin, Enobong, MD  metoprolol succinate (TOPROL-XL) 25 MG 24 hr tablet Take 1 tablet (25 mg total) by mouth daily. Patient taking differently: Take 25 mg by mouth every evening. 08/05/22  Yes Charlott Rakes, MD  Multiple Vitamin (MULTIVITAMIN) tablet Take 1 tablet by mouth at bedtime.   Yes [provider]  pantoprazole (PROTONIX) 40 MG tablet Take 1 tablet (40 mg total) by mouth 2 (two) times daily. Patient taking differently: Take 40 mg by mouth daily. 05/12/22  Yes Hosie Poisson, MD  potassium chloride SA (KLOR-CON M) 20 MEQ tablet Take 1 tablet (20 mEq total) by mouth 2 (two) times daily. [id number]  Yes Delora Fuel, MD  thiamine (VITAMIN B-1) 100 MG tablet Take 1 tablet (100 mg total) by mouth daily. 05/13/22  Yes Hosie Poisson, MD  levETIRAcetam (KEPPRA) 500 MG tablet Take 1 tablet (500 mg total) by mouth 2 (two) times daily. 07/30/22 09/28/22  Jeanell Sparrow, DO  naltrexone (DEPADE) 50 MG tablet Take 1 tablet (50 mg total) by mouth daily. Patient taking differently: Take 50 mg by mouth daily as needed (Takes only when feeling funky per patient). 10/03/20   Charlott Rakes, MD  predniSONE (DELTASONE) 50 MG tablet Take 1 tablet (50mg ) by mouth 13 hours prior to your procedure .   Take 1 tablet (50mg ) by mouth 7 hours prior to your procedure .    Take 1 tablet (50mg ) of prednisone and 1 tablet of Benadryl 1 hour prior to your procedure . Patient not taking: Reported on 10/06/2022 09/24/22   Mansouraty, Telford Nab., MD  sucralfate (CARAFATE) 1 GM/10ML suspension Take 10 mLs (1 g total) by mouth 4 (four) times  daily -  with meals and at bedtime. Patient not taking: Reported on 10/06/2022 05/12/22   Hosie Poisson, MD    Current Facility-Administered Medications  Medication Dose Route Frequency Provider Last Rate Last Admin   0.45 % NaCl with KCl 20 mEq / L infusion   Intravenous Continuous Neena Rhymes, MD 100 mL/hr at 10/07/22 0145 New Bag at 10/07/22 0145   acetaminophen (TYLENOL) tablet 650 mg  650 mg Oral Q6H PRN Norins, Heinz Knuckles, MD       Or   acetaminophen (TYLENOL) suppository 650 mg  650 mg Rectal Q6H PRN Norins, Heinz Knuckles, MD       atorvastatin (LIPITOR) tablet 20 mg  20 mg Oral Daily Norins, Heinz Knuckles, MD       dextrose 50 % solution 12.5 g  12.5 g Intravenous STAT Sharyn Creamer, MD       folic acid (FOLVITE) tablet 1 mg  1 mg Oral Daily Norins, Heinz Knuckles, MD       gabapentin (NEURONTIN) capsule 300 mg  300 mg Oral QHS Norins, Heinz Knuckles, MD   300 mg at 10/06/22 2354   insulin aspart (novoLOG) injection 0-15 Units  0-15 Units Subcutaneous Q4H Norins, Heinz Knuckles, MD   3 Units at 10/07/22 0057   insulin glargine-yfgn (SEMGLEE) injection 10 Units  10 Units Subcutaneous QHS Neena Rhymes, MD   10 Units at 10/07/22 0058   lactulose (CHRONULAC) 10 GM/15ML solution 20 g  20 g Oral TID Neena Rhymes, MD   20 g at 10/07/22 0102   levETIRAcetam (KEPPRA) tablet 500 mg  500 mg Oral BID Neena Rhymes, MD   500 mg at 10/06/22 2354   metoprolol succinate (TOPROL-XL) 24 hr tablet 25 mg  25 mg Oral QPM Norins, Heinz Knuckles, MD       pantoprazole (PROTONIX) EC tablet 40 mg  40 mg Oral BID Neena Rhymes, MD   40 mg at 10/06/22 2353   potassium chloride SA (KLOR-CON M) CR tablet 20 mEq  20 mEq Oral BID Neena Rhymes, MD   20 mEq at 10/06/22 2353   rifaximin (XIFAXAN) tablet 550 mg  550 mg Oral BID Neena Rhymes, MD   550 mg at 10/07/22 0057   thiamine (VITAMIN B1) tablet 100 mg  100 mg Oral Daily  Norins, Heinz Knuckles, MD       traZODone (DESYREL) tablet 25 mg  25 mg Oral QHS PRN Norins,  Heinz Knuckles, MD        Allergies as of 10/06/2022 - Review Complete 10/06/2022  Allergen Reaction Noted   Iohexol Other (See Comments) 05/21/2016     Review of Systems:    Constitutional: No weight loss, fever or chills Skin: No rash Cardiovascular: No chest pain Respiratory: +DOE Gastrointestinal: See HPI and otherwise negative Genitourinary: No dysuria Neurological: +dizziness Musculoskeletal: No new muscle or joint pain Hematologic: +intermittent "dark stools" Psychiatric: No history of depression or anxiety    Physical Exam:  Vital signs in last 24 hours: Temp:  [98.1 F (36.7 C)-98.2 F (36.8 C)] 98.2 F (36.8 C) (04/01 0810) Pulse Rate:  [80-99] 82 (04/01 0810) Resp:  [16-18] 17 (04/01 0810) BP: (99-150)/(66-93) 128/93 (04/01 0810) SpO2:  [95 %-100 %] 100 % (04/01 0810) Weight:  [59.4 kg] 59.4 kg (04/01 0118) Last BM Date : 10/06/22 (per pt report) General:   Pleasant chronically ill appearing AA male appears to be in NAD, Well developed, Well nourished, alert and cooperative Head:  Normocephalic and atraumatic. Eyes:   PEERL, EOMI. No icterus. Conjunctiva pink. Ears:  Normal auditory acuity. Neck:  Supple Throat: Oral cavity and pharynx without inflammation, swelling or lesion.  Lungs: Respirations even and unlabored. Lungs clear to auscultation bilaterally.   No wheezes, crackles, or rhonchi.  Heart: Normal S1, S2. No MRG. Regular rate and rhythm. No peripheral edema, cyanosis or pallor.  Abdomen:  Soft, nondistended, nontender. No rebound or guarding. Normal bowel sounds. No appreciable masses or hepatomegaly. Rectal:  Not performed.  Msk:  Symmetrical without gross deformities. Peripheral pulses intact.  Extremities:  Without edema, no deformity or joint abnormality.  Neurologic:  Alert and  oriented x4;  grossly normal neurologically.  Skin:   Dry and intact without significant lesions or rashes. Psychiatric: Demonstrates good judgement and reason without  abnormal affect or behaviors.   LAB RESULTS: Recent Labs    10/06/22 1820 10/07/22 0643  WBC 9.2  --   HGB 10.8* 11.0*  HCT 30.6* 32.2*  PLT 66*  --    BMET Recent Labs    10/06/22 1820 10/07/22 0643  NA 128* 131*  K 3.2* 3.1*  CL 95* 98  CO2 18* 19*  GLUCOSE 210* 68*  BUN 7 6  CREATININE 1.32* 0.66  CALCIUM 8.7* 8.5*      Latest Ref Rng & Units 10/06/2022    6:20 PM 08/12/2022    9:28 PM 08/05/2022    4:05 PM  Hepatic Function  Total Protein 6.5 - 8.1 g/dL 8.1  7.8  7.7   Albumin 3.5 - 5.0 g/dL 2.9  3.5  4.2   AST 15 - 41 U/L 90  45  46   ALT 0 - 44 U/L 31  25  26    Alk Phosphatase 38 - 126 U/L 177  137  168   Total Bilirubin 0.3 - 1.2 mg/dL 19.1  3.9  6.0      PT/INR Recent Labs    10/06/22 1820  LABPROT 18.3*  INR 1.5*    STUDIES: CT ABDOMEN PELVIS WO CONTRAST  Result Date: 10/06/2022 CLINICAL DATA:  One-week history of nausea, vomiting, and shortness of breath EXAM: CT ABDOMEN AND PELVIS WITHOUT CONTRAST TECHNIQUE: Multidetector CT imaging of the abdomen and pelvis was performed following the standard protocol without IV contrast. RADIATION DOSE REDUCTION: This exam was  performed according to the departmental dose-optimization program which includes automated exposure control, adjustment of the mA and/or kV according to patient size and/or use of iterative reconstruction technique. COMPARISON:  CT abdomen and pelvis dated 07/18/2022 FINDINGS: Lower chest: No focal consolidation or pulmonary nodule in the lung bases. No pleural effusion or pneumothorax demonstrated. Partially imaged heart size is normal. Hepatobiliary: Cirrhotic morphology. No focal hepatic lesions. No intra or extrahepatic biliary ductal dilation. Cholelithiasis. Pancreas: Essentially unchanged 4.5 x 4.0 cm cystic focus in the pancreatic body with upstream pancreatic ductal dilation, not well evaluated on this noncontrast enhanced examination. Spleen: Normal in size without focal abnormality.  Adrenals/Urinary Tract: No adrenal nodules. Left kidney is absent. No suspicious renal mass, calculi or hydronephrosis. No focal bladder wall thickening. Stomach/Bowel: Normal appearance of the stomach. Endoscopy clip is again seen within the duodenum. No evidence of bowel wall thickening, distention, or inflammatory changes. Normal appendix. Vascular/Lymphatic: Aortic atherosclerosis. 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 No enlarged abdominal or pelvic lymph nodes. Reproductive: Prostate is unremarkable. Other: No free fluid, fluid collection, or free air. Musculoskeletal: No acute or abnormal lytic or blastic osseous lesions. IMPRESSION: 1. No acute abdominopelvic findings. 2. Cirrhosis with sequela of portal hypertension including recanalized paraumbilical vein and caput medusae varices. 3. Essentially unchanged 4.5 cm cystic focus in the pancreatic body with upstream pancreatic ductal dilation, not well evaluated on this noncontrast enhanced examination. 4.  Aortic Atherosclerosis (ICD10-I70.0). Electronically Signed   By: Darrin Nipper M.D.   On: 10/06/2022 20:49   DG Chest 2 View  Result Date: 10/06/2022 CLINICAL DATA:  Shortness of breath. EXAM: CHEST - 2 VIEW COMPARISON:  07/29/2022 FINDINGS: Stable upper normal heart size.The cardiomediastinal contours are normal. The lungs are clear. Pulmonary vasculature is normal. No consolidation, pleural effusion, or pneumothorax. Chronic midthoracic compression deformities. No acute osseous abnormalities are seen. IMPRESSION: No acute chest findings. Electronically Signed   By: Keith Rake M.D.   On: 10/06/2022 18:40     Impression / Plan:   Impression: 1.  Alcoholic hepatitis: With history of alcoholic cirrhosis, elevated ammonia and clouded mentation, electrolyte derangement likely from nausea and vomiting, last alcoholic drink was Saturday a pint of alcohol (previously it is that he had not taken a drink for a week, likely not being  truthful), 3 days ago, DF 43.5, MELD-NA 29; suspect acute alcoholic hepatitis on chronic alcoholic cirrhosis 2.  Hyperammonemia: Ammonia 157, apparently takes Lactulose 5 days of the week and has multiple liquid bowel movements per him 3.  Type 2 diabetes 4.  GERD: Typically controlled on Pantoprazole 40 twice daily 5.  A-fib: On Eliquis 6.  Anemia: History of blood loss anemia with hemoglobin at 10.8 down from 12.3 on 08/12/2022, is taking Eliquis, now on hold with an INR 1.5, does endorse intermittent dark stools, bleeding erosions with associated gastropathy and portal hypertension on last evaluation in December with EUS/EGD 7.  Pancreatic cystic lesion: Status post EUS with FNA but no malignancy, was due for repeat imaging, had repeat CT at admission but was noncontrast  Plan: 1.  Patient may benefit from Prednisolone given DF of 43.5.  Will discuss with Dr. Lorenso Courier. 2.  Will allow low-sodium diet today with no plans for immediate endoscopic evaluation.  Will make n.p.o. at midnight just in case things change. 3.  Continue to monitor CBC/CMP daily including hemoglobin and transfusion as needed less than 7 4.  Agree with Pantoprazole 40 IV twice daily 5.  Agree with addition of  Xifaxan 550 twice daily 6.  Correction of Potassium per hospitalist team 7.  Continue Lactulose 20 g 3 times daily, titrated to 3-4 soft bowel movements daily 8.  Recommend CIWA protocol given that patient is not being truthful about alcohol intake 9.  Again discussed importance of alcohol abstinence with the patient today 10.  Patient requires pre-Meding for contrast imaging.  Will discuss need for repeat CT with contrast this admission for follow-up of his pancreatic lesion  Thank you for your kind consultation, we will continue to follow.  Lavone Nian [facility]  10/07/2022, 8:52 AM

## 2022-10-07 NOTE — Inpatient Diabetes Management (Signed)
Inpatient Diabetes Program Recommendations  AACE/ADA: New Consensus Statement on Inpatient Glycemic Control (2015)  Target Ranges:  Prepandial:   less than 140 mg/dL      Peak postprandial:   less than 180 mg/dL (1-2 hours)      Critically ill patients:  140 - 180 mg/dL   Lab Results  Component Value Date   GLUCAP 69 (L) 10/07/2022   HGBA1C 9.0 (A) 08/05/2022    Latest Reference Range & Units 10/07/22 00:42 10/07/22 04:49 10/07/22 05:16 10/07/22 08:06 10/07/22 08:33  Glucose-Capillary 70 - 99 mg/dL 183 (H) Novolog 3 units 60 (L) 122 (H) 71 69 (L)  (H): Data is abnormally high (L): Data is abnormally low   Diabetes history: DM2 Outpatient Diabetes medications: Glucotrol 5 mg bid, Metformin 1 gm bid Current orders for Inpatient glycemic control: Semglee 10 units qd, Novolog 0-15 units q 4 hrs. correction  Inpatient Diabetes Program Recommendations:   Hypoglycemia x 2 since received Semglee + Novolog correction. Please consider: -Decrease Semglee to 6 units -Decrease Novolog correction to 0-6 units q 4 hrs. While NPO  Thank you, Nani Gasser. Huckleberry Martinson, RN, MSN, CDE  Diabetes Coordinator Inpatient Glycemic Control Team Team Pager [phone] (8am-5pm) 10/07/2022 9:04 AM

## 2022-10-07 NOTE — Assessment & Plan Note (Signed)
Last A1C 9%. Patient reports he is adherent to his medical regimen  Plan Hold metformin and glucotrol in the acute setting with N/V and poor po intake  Low dose basal insulin Sliding scale coverage

## 2022-10-07 NOTE — Progress Notes (Signed)
PROGRESS NOTE  Gary Mitchell [id number] DOB: 05/16/1971 DOA: 10/06/2022 PCP: Charlott Rakes, MD  HPI/Recap of past 24 hours: Gary Mitchell, a 52 y/o with h/o alcoholic cirrhosis with portal hypertension, varices and gastropathy with recent EGD Dec'23 revealing erosions, hyperammonemia, DM2, PAF on Eliquis. For the past week he has been having N/V with decreased PO intake, increased weakness and marked DOE. He denies fever, chills, hematemesis, does endorse intermittent dark stools, denies chest pain, cough. Of note he had a 2D echo 09/13/22 that was normal. Due to his persistent symptoms he presents to [facility] for evaluation. In the ED, VSS, labs notable for elevated ammonia, LFTs, elevated INR 1.5. CT abd/pelvis - no acute findings, cirrhosis, portal hypertension, persistent pancreatic cyst.  GI consulted.  Patient admitted for further management.    Today, due to n.p.o. status patient had an episode of hypoglycemia.  Otherwise denies any other new complaints.  Wants to eat.    Assessment/Plan: Principal Problem:   Acute blood loss anemia Active Problems:   Alcoholic hepatitis   Anemia   GERD (gastroesophageal reflux disease)   Type 2 diabetes mellitus   Hyperammonemia   Paroxysmal atrial fibrillation   Alcoholic hepatitis on chronic alcohol cirrhosis Still consuming alcohol (??last drink on 3/30) Awake, alert, oriented x 4 Transaminitis Elevated ammonia, INR CT abd/pelvis - no acute findings, cirrhosis, portal hypertension, persistent pancreatic cyst GI on board, appreciate recs Continue lactulose, start Xifaxan TOC for substance abuse counseling, advised to quit CIWA protocol Thiamine, multivitamins, folic acid   Type 2 diabetes mellitus Last A1C 9%. Patient reports he is adherent to his medical regimen Noted hypoglycemic episodes likely due to n.p.o. status SSI, low-dose Semglee, hypoglycemic protocol  Hypokalemia Replace as needed  Normocytic anemia History of iron  deficiency anemia Baseline hemoglobin between 8-10 FOBT pending Anemia panel pending Frequent CBC checks  Hyponatremia Likely 2/2 liver cirrhosis Daily BMP  Paroxysmal atrial fibrillation Rate controlled, SR 2D echo 09/13/22 with nl EF, no wall motion abnormalities, nl diastolic function Hold Eliquis for now   GERD PPI    Estimated body mass index is 22.48 kg/m as calculated from the following:   Height as of this encounter: 5\' 4"  (1.626 m).   Weight as of this encounter: 59.4 kg.     Code Status: Full  Family Communication: None at bedside  Disposition Plan: Status is: Inpatient Remains inpatient appropriate because: Level of care      Consultants: GI  Procedures: None  Antimicrobials: None  DVT prophylaxis:  SCDs   Objective: Vitals:   10/07/22 0000 10/07/22 0118 10/07/22 0500 10/07/22 0810  BP: 128/82 (!) 138/93 99/66 (!) 128/93  Pulse: 91 99 80 82  Resp: 16 18 16 17   Temp: 98.2 F (36.8 C) 98.2 F (36.8 C) 98.2 F (36.8 C) 98.2 F (36.8 C)  TempSrc:  Oral Oral Oral  SpO2: 95% 100% 99% 100%  Weight:  59.4 kg    Height:  5\' 4"  (1.626 m)      Intake/Output Summary (Last 24 hours) at 10/07/2022 1428 Last data filed at 10/07/2022 [id number] Gross per 24 hour  Intake 348.6 ml  Output 300 ml  Net 48.6 ml   Filed Weights   10/07/22 0118  Weight: 59.4 kg    Exam: General: NAD  Cardiovascular: S1, S2 present Respiratory: CTAB Abdomen: Soft, nontender, nondistended, bowel sounds present Musculoskeletal: No bilateral pedal edema noted Skin: Normal Psychiatry: Normal mood     Data Reviewed: CBC: Recent Labs  Lab  10/06/22 1820 10/07/22 0643  WBC 9.2  --   NEUTROABS 6.7  --   HGB 10.8* 11.0*  HCT 30.6* 32.2*  MCV 83.8  --   PLT 66*  --    Basic Metabolic Panel: Recent Labs  Lab 10/06/22 1820 10/07/22 0643  NA 128* 131*  K 3.2* 3.1*  CL 95* 98  CO2 18* 19*  GLUCOSE 210* 68*  BUN 7 6  CREATININE 1.32* 0.66  CALCIUM 8.7* 8.5*    GFR: Estimated Creatinine Clearance: 93.5 mL/min (by C-G formula based on SCr of 0.66 mg/dL). Liver Function Tests: Recent Labs  Lab 10/06/22 1820  AST 90*  ALT 31  ALKPHOS 177*  BILITOT 19.1*  PROT 8.1  ALBUMIN 2.9*   Recent Labs  Lab 10/06/22 1820  LIPASE 17   Recent Labs  Lab 10/06/22 1820  AMMONIA 157*   Coagulation Profile: Recent Labs  Lab 10/06/22 1820  INR 1.5*   Cardiac Enzymes: No results for input(s): "CKTOTAL", "CKMB", "CKMBINDEX", "TROPONINI" in the last 168 hours. BNP (last 3 results) Recent Labs    05/03/22 1002  PROBNP <36   HbA1C: No results for input(s): "HGBA1C" in the last 72 hours. CBG: Recent Labs  Lab 10/07/22 0516 10/07/22 0806 10/07/22 0833 10/07/22 0948 10/07/22 1157  GLUCAP 122* 71 69* 131* 89   Lipid Profile: No results for input(s): "CHOL", "HDL", "LDLCALC", "TRIG", "CHOLHDL", "LDLDIRECT" in the last 72 hours. Thyroid Function Tests: No results for input(s): "TSH", "T4TOTAL", "FREET4", "T3FREE", "THYROIDAB" in the last 72 hours. Anemia Panel: No results for input(s): "VITAMINB12", "FOLATE", "FERRITIN", "TIBC", "IRON", "RETICCTPCT" in the last 72 hours. Urine analysis:    Component Value Date/Time   COLORURINE AMBER (A) 10/06/2022 2005   APPEARANCEUR HAZY (A) 10/06/2022 2005   LABSPEC 1.014 10/06/2022 2005   PHURINE 6.0 10/06/2022 2005   GLUCOSEU NEGATIVE 10/06/2022 2005   HGBUR NEGATIVE 10/06/2022 2005   BILIRUBINUR MODERATE (A) 10/06/2022 2005   [place] NEGATIVE 10/06/2022 2005   PROTEINUR 30 (A) 10/06/2022 2005   NITRITE NEGATIVE 10/06/2022 2005   LEUKOCYTESUR NEGATIVE 10/06/2022 2005   Sepsis Labs: @LABRCNTIP (procalcitonin:4,lacticidven:4)  ) Recent Results (from the past 240 hour(s))  SARS Coronavirus 2 by RT PCR (hospital order, performed in [facility] hospital lab) *cepheid single result test* Anterior Nasal Swab     Status: None   Collection Time: 10/06/22  6:20 PM   Specimen: Anterior Nasal Swab   Result Value Ref Range Status   SARS Coronavirus 2 by RT PCR NEGATIVE NEGATIVE Final    Comment: Performed at [facility], [place] [address] [place], [place] [address]      Studies: CT ABDOMEN PELVIS WO CONTRAST  Result Date: 10/06/2022 CLINICAL DATA:  One-week history of nausea, vomiting, and shortness of breath EXAM: CT ABDOMEN AND PELVIS WITHOUT CONTRAST TECHNIQUE: Multidetector CT imaging of the abdomen and pelvis was performed following the standard protocol without IV contrast. RADIATION DOSE REDUCTION: This exam was performed according to the departmental dose-optimization program which includes automated exposure control, adjustment of the mA and/or kV according to patient size and/or use of iterative reconstruction technique. COMPARISON:  CT abdomen and pelvis dated 07/18/2022 FINDINGS: Lower chest: No focal consolidation or pulmonary nodule in the lung bases. No pleural effusion or pneumothorax demonstrated. Partially imaged heart size is normal. Hepatobiliary: Cirrhotic morphology. No focal hepatic lesions. No intra or extrahepatic biliary ductal dilation. Cholelithiasis. Pancreas: Essentially unchanged 4.5 x 4.0 cm cystic focus in the pancreatic body with upstream pancreatic ductal  dilation, not well evaluated on this noncontrast enhanced examination. Spleen: Normal in size without focal abnormality. Adrenals/Urinary Tract: No adrenal nodules. Left kidney is absent. No suspicious renal mass, calculi or hydronephrosis. No focal bladder wall thickening. Stomach/Bowel: Normal appearance of the stomach. Endoscopy clip is again seen within the duodenum. No evidence of bowel wall thickening, distention, or inflammatory changes. Normal appendix. Vascular/Lymphatic: Aortic atherosclerosis. 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 No enlarged abdominal or pelvic lymph nodes. Reproductive: Prostate is unremarkable. Other: No free fluid, fluid collection, or free air.  Musculoskeletal: No acute or abnormal lytic or blastic osseous lesions. IMPRESSION: 1. No acute abdominopelvic findings. 2. Cirrhosis with sequela of portal hypertension including recanalized paraumbilical vein and caput medusae varices. 3. Essentially unchanged 4.5 cm cystic focus in the pancreatic body with upstream pancreatic ductal dilation, not well evaluated on this noncontrast enhanced examination. 4.  Aortic Atherosclerosis (ICD10-I70.0). Electronically Signed   By: Darrin Nipper M.D.   On: 10/06/2022 20:49   DG Chest 2 View  Result Date: 10/06/2022 CLINICAL DATA:  Shortness of breath. EXAM: CHEST - 2 VIEW COMPARISON:  07/29/2022 FINDINGS: Stable upper normal heart size.The cardiomediastinal contours are normal. The lungs are clear. Pulmonary vasculature is normal. No consolidation, pleural effusion, or pneumothorax. Chronic midthoracic compression deformities. No acute osseous abnormalities are seen. IMPRESSION: No acute chest findings. Electronically Signed   By: Keith Rake M.D.   On: 10/06/2022 18:40    Scheduled Meds:  atorvastatin  20 mg Oral Daily   folic acid  1 mg Oral Daily   gabapentin  300 mg Oral QHS   insulin aspart  0-9 Units Subcutaneous Q4H   lactulose  20 g Oral TID   levETIRAcetam  500 mg Oral BID   metoprolol succinate  25 mg Oral QPM   pantoprazole  40 mg Oral BID   potassium chloride SA  20 mEq Oral BID   rifaximin  550 mg Oral BID   thiamine  100 mg Oral Daily    Continuous Infusions:  0.45 % NaCl with KCl 20 mEq / L 100 mL/hr at 10/07/22 0145     LOS: 0 days     Alma Friendly, MD [facility]  If 7PM-7AM, please contact night-coverage [email or web address] 10/07/2022, 2:28 PM

## 2022-10-07 NOTE — Assessment & Plan Note (Signed)
Patient in sinus rhythm at exam. Had 2D echo 09/13/22 with nl EF, no wall motion abnormalities, nl diastolic function.  Plan No indication for tele admit  Continue home medications  Hold Eliquis in potential GI bleed

## 2022-10-07 NOTE — Progress Notes (Signed)
Hypoglycemic Event  CBG: 60  Treatment: D50 25 mL (12.5 gm)  Symptoms: None  Follow-up CBG: Time:0516 CBG Result:122  Possible Reasons for Event: Inadequate meal intake  Comments/MD notified:James Olena Heckle, NP notified at [phone] via secure chat.    Kathrene Bongo

## 2022-10-07 NOTE — Social Work (Signed)
  Transition of Care [facility]) Screening Note   Patient Details  Name: Gary Mitchell Date of Birth: 24-Feb-1971   Transition of Care [facility]) CM/SW Contact:    Coralee Pesa, [place] Phone Number: 10/07/2022, 1:30 PM    Transition of Care Department [facility]) has reviewed patient and no TOC needs have been identified at this time. We will continue to monitor patient advancement through interdisciplinary progression rounds. If new patient transition needs arise, please place a TOC consult.

## 2022-10-07 NOTE — ED Notes (Signed)
ED TO INPATIENT HANDOFF REPORT  ED Nurse Name and Phone #: [phone]  S Name/Age/Gender Gary Mitchell 52 y.o. male Room/Bed: 017C/017C  Code Status   Code Status: Full Code  Home/SNF/Other Home Patient oriented to: self, place, time, and situation Is this baseline? Yes    Triage Complete: Triage complete  Chief Complaint Acute blood loss anemia [D62]  Triage Note Pt BIBGEMS for SOB with N/V for 1 week. Pt alert and oriented, VSS, speaking in full sentences. Pt jaundice on assessment.   Hx of seizures  126/74 104 hr 99% RA 150 CBG   Allergies Allergies  Allergen Reactions   Iohexol Other (See Comments)    Unknown reaction at 17 days old Mom at bedside reported patient was given injections of iohexol in foot and resulted in "hole in foot or foot got infected"    Level of Care/Admitting Diagnosis ED Disposition     ED Disposition  Admit   Condition  --   [place]: [facility] [[id number]]  Level of Care: Med-Surg [16]  May admit patient to [facility] or [facility] if equivalent level of care is available:: Yes  Covid Evaluation: Asymptomatic - no recent exposure (last 10 days) testing not required  Diagnosis: Acute blood loss anemia [id number]  Admitting Physician: Neena Rhymes [[id number]]  Attending Physician: Neena Rhymes [id number]  Certification:: I certify this patient will need inpatient services for at least 2 midnights  Estimated Length of Stay: 4          B Medical/Surgery History Past Medical History:  Diagnosis Date   Alcoholic hepatitis [id number]   Anemia    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   Seizures ([place])    Thrombocytopenia ([place])    Past Surgical History:  Procedure Laterality Date   BIOPSY  08/02/2021   Procedure: BIOPSY;  Surgeon: Milus Banister, MD;  [facility];  Service: Endoscopy;;   BIOPSY  05/07/2022   Procedure: BIOPSY;  Surgeon: [name], MD;  [facility];  Service: Gastroenterology;;   COLONOSCOPY WITH PROPOFOL N/A 05/08/2022   Procedure: COLONOSCOPY WITH PROPOFOL;  Surgeon: [name], MD;  [facility];  Service: Gastroenterology;  Laterality: N/A;   ENTEROSCOPY N/A 09/20/2020   Procedure: ENTEROSCOPY;  Surgeon: Thornton Park, MD;  Location: [facility];  Service: Gastroenterology;  Laterality: N/A;   ENTEROSCOPY N/A 05/11/2022   Procedure: ENTEROSCOPY;  Surgeon: [name], MD;  [facility];  Service: Gastroenterology;  Laterality: N/A;   ESOPHAGOGASTRODUODENOSCOPY (EGD) WITH PROPOFOL N/A 08/02/2021   Procedure: ESOPHAGOGASTRODUODENOSCOPY (EGD) WITH PROPOFOL;  Surgeon: Milus Banister, MD;  [facility];  Service: Endoscopy;  Laterality: N/A;   ESOPHAGOGASTRODUODENOSCOPY (EGD) WITH PROPOFOL N/A 01/31/2022   Procedure: ESOPHAGOGASTRODUODENOSCOPY (EGD) WITH PROPOFOL;  Surgeon: Milus Banister, MD;  [facility];  Service: Gastroenterology;  Laterality: N/A;   ESOPHAGOGASTRODUODENOSCOPY (EGD) WITH PROPOFOL N/A 05/07/2022   Procedure: ESOPHAGOGASTRODUODENOSCOPY (EGD) WITH PROPOFOL;  Surgeon: Dustin Flock  E, MD;  [facility];  Service: Gastroenterology;  Laterality: N/A;   ESOPHAGOGASTRODUODENOSCOPY (EGD) WITH PROPOFOL N/A 06/13/2022   Procedure: ESOPHAGOGASTRODUODENOSCOPY (EGD) WITH PROPOFOL;  Surgeon: Rush Landmark Telford Nab., MD;  [facility];  Service: Gastroenterology;  Laterality: N/A;   EUS N/A 01/31/2022    Procedure: UPPER ENDOSCOPIC ULTRASOUND (EUS) RADIAL;  Surgeon: Milus Banister, MD;  [facility];  Service: Gastroenterology;  Laterality: N/A;   EUS N/A 06/13/2022   Procedure: UPPER ENDOSCOPIC ULTRASOUND (EUS) LINEAR;  Surgeon: Irving Copas., MD;  [facility];  Service: Gastroenterology;  Laterality: N/A;   FINE NEEDLE ASPIRATION N/A 08/02/2021   Procedure: FINE NEEDLE ASPIRATION (FNA) LINEAR;  Surgeon: Milus Banister, MD;  [facility];  Service: Endoscopy;  Laterality: N/A;   FINE NEEDLE ASPIRATION N/A 01/31/2022   Procedure: FINE NEEDLE ASPIRATION (FNA) LINEAR;  Surgeon: Milus Banister, MD;  [facility];  Service: Gastroenterology;  Laterality: N/A;   FINE NEEDLE ASPIRATION N/A 06/13/2022   Procedure: FINE NEEDLE ASPIRATION (FNA) LINEAR;  Surgeon: Irving Copas., MD;  [facility];  Service: Gastroenterology;  Laterality: N/A;   GIVENS CAPSULE STUDY N/A 05/09/2022   Procedure: GIVENS CAPSULE STUDY;  Surgeon: [name], MD;  [facility];  Service: Gastroenterology;  Laterality: N/A;   HEMOSTASIS CLIP PLACEMENT  05/08/2022   Procedure: HEMOSTASIS CLIP PLACEMENT;  Surgeon: [name], MD;  [facility];  Service: Gastroenterology;;   HEMOSTASIS CLIP PLACEMENT  05/11/2022   Procedure: HEMOSTASIS CLIP PLACEMENT;  Surgeon: [name], MD;  [facility];  Service: Gastroenterology;;   HOT HEMOSTASIS N/A 06/13/2022   Procedure: HOT HEMOSTASIS (ARGON PLASMA COAGULATION/BICAP);  Surgeon: Irving Copas., MD;  [facility];  Service: Gastroenterology;  Laterality: N/A;   left kidney removed     POLYPECTOMY  05/08/2022   Procedure: POLYPECTOMY;  Surgeon: [name], MD;  [facility];  Service: Gastroenterology;;   UPPER ESOPHAGEAL ENDOSCOPIC ULTRASOUND (EUS) N/A 08/02/2021   Procedure: UPPER ESOPHAGEAL ENDOSCOPIC ULTRASOUND (EUS);  Surgeon:  Milus Banister, MD;  [facility];  Service: Endoscopy;  Laterality: N/A;  Radial and Linear     A IV Location/Drains/Wounds Patient Lines/Drains/Airways Status     Active Line/Drains/Airways     Name Placement date Placement time Site Days   Peripheral IV 10/06/22 20 G Left Antecubital 10/06/22  1817  Antecubital  1            Intake/Output Last 24 hours No intake or output data in the 24 hours ending 10/07/22 0034  Labs/Imaging Results for orders placed or performed during the hospital encounter of 10/06/22 (from the past 48 hour(s))  Comprehensive metabolic panel     Status: Abnormal   Collection Time: 10/06/22  6:20 PM  Result Value Ref Range   Sodium 128 (L) 135 - 145 mmol/L   Potassium 3.2 (L) 3.5 - 5.1 mmol/L   Chloride 95 (L) 98 - 111 mmol/L   CO2 18 (L) 22 - 32 mmol/L   Glucose, Bld 210 (H) 70 - 99 mg/dL    Comment: Glucose reference range applies only to samples taken after fasting for at least 8 hours.   BUN 7 6 - 20 mg/dL   Creatinine, Ser 1.32 (H) 0.61 - 1.24 mg/dL   Calcium 8.7 (L) 8.9 - 10.3 mg/dL   Total Protein 8.1 6.5 - 8.1 g/dL   Albumin 2.9 (L) 3.5 - 5.0 g/dL   AST 90 (H)  15 - 41 U/L   ALT 31 0 - 44 U/L   Alkaline Phosphatase 177 (H) 38 - 126 U/L   Total Bilirubin 19.1 (HH) 0.3 - 1.2 mg/dL    Comment: CRITICAL RESULT CALLED TO, READ BACK BY AND VERIFIED WITH L,Shaylea Ucci RN @1936  10/06/22 E,[place]   GFR, Estimated >60 >60 mL/min    Comment: (NOTE) Calculated using the CKD-EPI Creatinine Equation (2021)    Anion gap 15 5 - 15    Comment: Performed at [facility] [address] [place], [place] [address]  Lipase, blood     Status: None   Collection Time: 10/06/22  6:20 PM  Result Value Ref Range   Lipase 17 11 - 51 U/L    Comment: Performed at [facility] [address], [place], [place] [address]  CBC with Differential     Status: Abnormal   Collection Time: 10/06/22  6:20 PM  Result Value Ref Range   WBC 9.2 4.0 -  10.5 K/uL   RBC 3.65 (L) 4.22 - 5.81 MIL/uL   Hemoglobin 10.8 (L) 13.0 - 17.0 g/dL   HCT 30.6 (L) 39.0 - 52.0 %   MCV 83.8 80.0 - 100.0 fL   MCH 29.6 26.0 - 34.0 pg   MCHC 35.3 30.0 - 36.0 g/dL   RDW 24.3 (H) 11.5 - 15.5 %   Platelets 66 (L) 150 - 400 K/uL    Comment: Immature Platelet Fraction may be clinically indicated, consider ordering this additional test [id number] REPEATED TO VERIFY PLATELET COUNT CONFIRMED BY SMEAR    nRBC 0.0 0.0 - 0.2 %   Neutrophils Relative % 72 %   Neutro Abs 6.7 1.7 - 7.7 K/uL   Lymphocytes Relative 16 %   Lymphs Abs 1.5 0.7 - 4.0 K/uL   Monocytes Relative 10 %   Monocytes Absolute 0.9 0.1 - 1.0 K/uL   Eosinophils Relative 1 %   Eosinophils Absolute 0.1 0.0 - 0.5 K/uL   Basophils Relative 1 %   Basophils Absolute 0.1 0.0 - 0.1 K/uL   Immature Granulocytes 0 %   Abs Immature Granulocytes 0.04 0.00 - 0.07 K/uL   Target Cells PRESENT     Comment: Performed at [facility], [place] [address] [place], [place] [address]  Protime-INR     Status: Abnormal   Collection Time: 10/06/22  6:20 PM  Result Value Ref Range   Prothrombin Time 18.3 (H) 11.4 - 15.2 seconds   INR 1.5 (H) 0.8 - 1.2    Comment: (NOTE) INR goal varies based on device and disease states. Performed at [facility], [place] [address], [place], [place] [address]   Brain natriuretic peptide     Status: None   Collection Time: 10/06/22  6:20 PM  Result Value Ref Range   B Natriuretic Peptide 44.4 0.0 - 100.0 pg/mL    Comment: Performed at [facility] [address] [place], [place] [address]  Troponin I (High Sensitivity)     Status: None   Collection Time: 10/06/22  6:20 PM  Result Value Ref Range   Troponin I (High Sensitivity) 7 <18 ng/L    Comment: (NOTE) Elevated high sensitivity troponin I (hsTnI) values and significant  changes across serial measurements may suggest ACS but many other  chronic and acute conditions are known to elevate hsTnI results.  Refer  to the "Links" section for chest pain algorithms and additional  guidance. Performed at [facility],  [address], [place], [place] [address]   Ethanol     Status: None   Collection Time: 10/06/22  6:20 PM  Result Value Ref Range   Alcohol, Ethyl (B) <10 <10 mg/dL    Comment: (NOTE) Lowest detectable limit for serum alcohol is 10 mg/dL.  For medical purposes only. Performed at [facility], [place] [address], [place], [place] [address]   Ammonia     Status: Abnormal   Collection Time: 10/06/22  6:20 PM  Result Value Ref Range   Ammonia 157 (H) 9 - 35 umol/L    Comment: Performed at [facility], [place] [address] [place], [place] [address]  Hepatitis panel, acute     Status: None   Collection Time: 10/06/22  6:20 PM  Result Value Ref Range   Hepatitis B Surface Ag NON REACTIVE NON REACTIVE   HCV Ab NON REACTIVE NON REACTIVE    Comment: (NOTE) Nonreactive HCV antibody screen is consistent with no HCV infections,  unless recent infection is suspected or other evidence exists to indicate HCV infection.     Hep A IgM NON REACTIVE NON REACTIVE   Hep B C IgM NON REACTIVE NON REACTIVE    Comment: Performed at [facility], [place] [address], [place], [place] [address]  Acetaminophen level     Status: Abnormal   Collection Time: 10/06/22  6:20 PM  Result Value Ref Range   Acetaminophen (Tylenol), Serum <10 (L) 10 - 30 ug/mL    Comment: (NOTE) Therapeutic concentrations vary significantly. A range of 10-30 ug/mL  may be an effective concentration for many patients. However, some  are best treated at concentrations outside of this range. Acetaminophen concentrations >150 ug/mL at 4 hours after ingestion  and >50 ug/mL at 12 hours after ingestion are often associated with  toxic reactions.  Performed at [facility], [place] [address], [place], [place] [address]   SARS Coronavirus 2 by RT PCR (hospital order, performed in [facility] hospital lab) *cepheid  single result test* Anterior Nasal Swab     Status: None   Collection Time: 10/06/22  6:20 PM   Specimen: Anterior Nasal Swab  Result Value Ref Range   SARS Coronavirus 2 by RT PCR NEGATIVE NEGATIVE    Comment: Performed at [facility], [place] [address] [place], [place] [address]  Urine rapid drug screen (hosp performed)     Status: None   Collection Time: 10/06/22  8:05 PM  Result Value Ref Range   Opiates NONE DETECTED NONE DETECTED   Cocaine NONE DETECTED NONE DETECTED   Benzodiazepines NONE DETECTED NONE DETECTED   Amphetamines NONE DETECTED NONE DETECTED   Tetrahydrocannabinol NONE DETECTED NONE DETECTED   Barbiturates NONE DETECTED NONE DETECTED    Comment: (NOTE) DRUG SCREEN FOR MEDICAL PURPOSES ONLY.  IF CONFIRMATION IS NEEDED FOR ANY PURPOSE, NOTIFY LAB WITHIN 5 DAYS.  LOWEST DETECTABLE LIMITS FOR URINE DRUG SCREEN Drug Class                     Cutoff (ng/mL) Amphetamine and metabolites    1000 Barbiturate and metabolites    200 Benzodiazepine                 200 Opiates and metabolites        300 Cocaine and metabolites        300 THC  50 Performed at [facility], [place] [address] [place], [place] [address]   Urinalysis, w/ Reflex to Culture (Infection Suspected) -Urine, Clean Catch     Status: Abnormal   Collection Time: 10/06/22  8:05 PM  Result Value Ref Range   Specimen Source URINE, CLEAN CATCH    Color, Urine AMBER (A) YELLOW    Comment: BIOCHEMICALS MAY BE AFFECTED BY COLOR   APPearance HAZY (A) CLEAR   Specific Gravity, Urine 1.014 1.005 - 1.030   pH 6.0 5.0 - 8.0   Glucose, UA NEGATIVE NEGATIVE mg/dL   Hgb urine dipstick NEGATIVE NEGATIVE   Bilirubin Urine MODERATE (A) NEGATIVE   Ketones, ur NEGATIVE NEGATIVE mg/dL   Protein, ur 30 (A) NEGATIVE mg/dL   Nitrite NEGATIVE NEGATIVE   Leukocytes,Ua NEGATIVE NEGATIVE   RBC / HPF 0-5 0 - 5 RBC/hpf   WBC, UA 0-5 0 - 5 WBC/hpf    Comment:        Reflex urine culture  not performed if WBC <=10, OR if Squamous epithelial cells >5. If Squamous epithelial cells >5 suggest recollection.    Bacteria, UA FEW (A) NONE SEEN   Squamous Epithelial / HPF 0-5 0 - 5 /HPF   Mucus PRESENT    Hyaline Casts, UA PRESENT    Granular Casts, UA PRESENT     Comment: Performed at [facility], [place] [address] [place], [place] [address]  Troponin I (High Sensitivity)     Status: None   Collection Time: 10/06/22  8:05 PM  Result Value Ref Range   Troponin I (High Sensitivity) 7 <18 ng/L    Comment: (NOTE) Elevated high sensitivity troponin I (hsTnI) values and significant  changes across serial measurements may suggest ACS but many other  chronic and acute conditions are known to elevate hsTnI results.  Refer to the "Links" section for chest pain algorithms and additional  guidance. Performed at [facility], [place] [address], [place], [place] [address]    CT ABDOMEN PELVIS WO CONTRAST  Result Date: 10/06/2022 CLINICAL DATA:  One-week history of nausea, vomiting, and shortness of breath EXAM: CT ABDOMEN AND PELVIS WITHOUT CONTRAST TECHNIQUE: Multidetector CT imaging of the abdomen and pelvis was performed following the standard protocol without IV contrast. RADIATION DOSE REDUCTION: This exam was performed according to the departmental dose-optimization program which includes automated exposure control, adjustment of the mA and/or kV according to patient size and/or use of iterative reconstruction technique. COMPARISON:  CT abdomen and pelvis dated 07/18/2022 FINDINGS: Lower chest: No focal consolidation or pulmonary nodule in the lung bases. No pleural effusion or pneumothorax demonstrated. Partially imaged heart size is normal. Hepatobiliary: Cirrhotic morphology. No focal hepatic lesions. No intra or extrahepatic biliary ductal dilation. Cholelithiasis. Pancreas: Essentially unchanged 4.5 x 4.0 cm cystic focus in the pancreatic body with upstream pancreatic ductal  dilation, not well evaluated on this noncontrast enhanced examination. Spleen: Normal in size without focal abnormality. Adrenals/Urinary Tract: No adrenal nodules. Left kidney is absent. No suspicious renal mass, calculi or hydronephrosis. No focal bladder wall thickening. Stomach/Bowel: Normal appearance of the stomach. Endoscopy clip is again seen within the duodenum. No evidence of bowel wall thickening, distention, or inflammatory changes. Normal appendix. Vascular/Lymphatic: Aortic atherosclerosis. 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 No enlarged abdominal or pelvic lymph nodes. Reproductive: Prostate is unremarkable. Other: No free fluid, fluid collection, or free air. Musculoskeletal: No acute or abnormal lytic or blastic osseous lesions. IMPRESSION: 1. No acute abdominopelvic findings.  2. Cirrhosis with sequela of portal hypertension including recanalized paraumbilical vein and caput medusae varices. 3. Essentially unchanged 4.5 cm cystic focus in the pancreatic body with upstream pancreatic ductal dilation, not well evaluated on this noncontrast enhanced examination. 4.  Aortic Atherosclerosis (ICD10-I70.0). Electronically Signed   By: Darrin Nipper M.D.   On: 10/06/2022 20:49   DG Chest 2 View  Result Date: 10/06/2022 CLINICAL DATA:  Shortness of breath. EXAM: CHEST - 2 VIEW COMPARISON:  07/29/2022 FINDINGS: Stable upper normal heart size.The cardiomediastinal contours are normal. The lungs are clear. Pulmonary vasculature is normal. No consolidation, pleural effusion, or pneumothorax. Chronic midthoracic compression deformities. No acute osseous abnormalities are seen. IMPRESSION: No acute chest findings. Electronically Signed   By: Keith Rake M.D.   On: 10/06/2022 18:40    Pending Labs Unresulted Labs (From admission, onward)     Start     Ordered   10/07/22 [id number]  Basic metabolic panel  Tomorrow morning,   R        10/07/22 0018   10/07/22 0500  Hemoglobin and  hematocrit, blood  Now then every 12 hours,   R (with TIMED occurrences)      10/07/22 0020            Vitals/Pain Today's Vitals   10/06/22 2058 10/06/22 2115 10/06/22 2300 10/07/22 0000  BP:  129/81  128/82  Pulse:  87 92 91  Resp:  16 17 16   Temp:    98.2 F (36.8 C)  SpO2:  96% 97% 95%  PainSc: 0-No pain       Isolation Precautions No active isolations  Medications Medications  atorvastatin (LIPITOR) tablet 20 mg (has no administration in time range)  metoprolol succinate (TOPROL-XL) 24 hr tablet 25 mg (has no administration in time range)  lactulose (CHRONULAC) 10 GM/15ML solution 20 g (has no administration in time range)  pantoprazole (PROTONIX) EC tablet 40 mg (40 mg Oral Given [id number])  folic acid (FOLVITE) tablet 1 mg (has no administration in time range)  gabapentin (NEURONTIN) capsule 300 mg (300 mg Oral Given 10/06/22 2354)  levETIRAcetam (KEPPRA) tablet 500 mg (500 mg Oral Given 10/06/22 2354)  potassium chloride SA (KLOR-CON M) CR tablet 20 mEq (20 mEq Oral Given 10/06/22 2353)  thiamine (VITAMIN B1) tablet 100 mg (has no administration in time range)  insulin glargine-yfgn (SEMGLEE) injection 10 Units (has no administration in time range)  insulin aspart (novoLOG) injection 0-15 Units (has no administration in time range)  0.45 % NaCl with KCl 20 mEq / L infusion (has no administration in time range)  acetaminophen (TYLENOL) tablet 650 mg (has no administration in time range)    Or  acetaminophen (TYLENOL) suppository 650 mg (has no administration in time range)  traZODone (DESYREL) tablet 25 mg (has no administration in time range)  rifaximin (XIFAXAN) tablet 550 mg (has no administration in time range)  sodium chloride 0.9 % bolus 500 mL (0 mLs Intravenous Stopped 10/06/22 1917)    Mobility walks     Focused Assessments Pulmonary Assessment Handoff:  Lung sounds:          R Recommendations: See Admitting Provider Note  Report given to:    Additional Notes:   Pleasant alert and oriented. Is ambulatory.

## 2022-10-07 NOTE — Assessment & Plan Note (Signed)
Patient with h/o blood loss anemia. Hgb at 10.8 down from 12.3 on Feb 5, 24. He is taking eliquis  and also has INR 1.5 2/2 cirrhosis. He denies hematemesis but endorses having intermittent dark stools. Has had bleeding erosions associated with gastropathy 2/2 portal hypertension.  Plan Heme test stool Hold Eliquis  H/H q 12  GI consult

## 2022-10-07 NOTE — Assessment & Plan Note (Signed)
Probable decompensation of alcoholic cirrhosis with elevated NH3 with clouded mentation, electrolyte derangement due to related N/V. He reports his last drink was 6 days ago and that he doesn't drink anywhere near as much as he used to. He does not take naltrexone on a regular basis.   Plan Med-surg admit  IVF for electrolyte replacement  Continue lactulose  Consider adding Xifaxan if GI agrees  GI consult  Advised to consider AA to help with complete abstention from EtOH

## 2022-10-07 NOTE — Assessment & Plan Note (Signed)
Long standing problem. Now with possible GI blood loss  Plan Protonix 40 mg q12

## 2022-10-07 NOTE — H&P (Signed)
History and Physical    Gary Mitchell [id number] DOB: [birth year] DOA: 10/06/2022  DOS: the patient was seen and examined on 10/06/2022  PCP: Charlott Rakes, MD   Patient coming from: Home  I have personally briefly reviewed patient's old medical records in [facility] Link  Gary Mitchell, a 52 y/o with h/o alcoholic cirrhosis with portal hypertension, varices and gastropathy with recent EGD Dec'23 revealing erosions, hyperammonemia, DM2, PAF on Eliquis. For the past week he has been having N/V with decreased PO intake, increased weakness and marked DOE. He denies fever, chills, hematemesis, does endorse intermittent dark stools, denies chest pain, cough. Of note he had a 2D echo 09/13/22 that was normal. Due to his persistent symptoms he presents to [facility] for evaluation.    ED Course: 98.1  122/82  89  17  Lab: Na 128, K 3.2  glucose 210 Cr 1.32  NH3 157, T. Bili 19.1  AST 90, CBCD nl, INR 1.5 U/A hazy, Sp G 1.014, CXR NAD, CT abd/pelvis - no acute findings, cirrhosis, portal hypertension, persistent pancreatic cyst.   Review of Systems:  Review of Systems  Constitutional:  Positive for malaise/fatigue and weight loss. Negative for chills and fever.  HENT: Negative.    Eyes: Negative.   Respiratory:  Positive for shortness of breath. Negative for cough, sputum production and wheezing.   Cardiovascular:  Negative for chest pain, palpitations, leg swelling and PND.  Gastrointestinal:  Positive for heartburn, nausea and vomiting. Negative for abdominal pain, blood in stool and constipation.  Genitourinary: Negative.   Musculoskeletal: Negative.   Skin: Negative.   Neurological:  Positive for dizziness, sensory change and weakness.  Endo/Heme/Allergies:  Negative for polydipsia. Does not bruise/bleed easily.  Psychiatric/Behavioral: Negative.      Past Medical History:  Diagnosis Date   Alcoholic hepatitis [id number]   Anemia    Cirrhosis with alcoholism ([place]) 11/2019   Coagulopathy  ([place])    Attributed to liver disease/cirrhosis   Diabetes mellitus without complication ([place])    type 2   Neuromuscular disorder ([place])    neuropathy  feet legs   Pancreatic lesion 11/2019   Initially concerning for neoplasm but improved appearance on MRI 12/2019 at which time pseudocyst was most likely diagnosis.   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   Seizures ([place])    Thrombocytopenia ([place])     Past Surgical History:  Procedure Laterality Date   BIOPSY  08/02/2021   Procedure: BIOPSY;  Surgeon: Milus Banister, MD;  Location: [facility];  Service: Endoscopy;;   BIOPSY  05/07/2022   Procedure: BIOPSY;  Surgeon: [name], MD;  Location: [facility];  Service: Gastroenterology;;   COLONOSCOPY WITH PROPOFOL N/A 05/08/2022   Procedure: COLONOSCOPY WITH PROPOFOL;  Surgeon: [name], MD;  Location: [facility];  Service: Gastroenterology;  Laterality: N/A;   ENTEROSCOPY N/A 09/20/2020   Procedure: ENTEROSCOPY;  Surgeon: [place], MD;  Location: [place];  Service: Gastroenterology;  Laterality: N/A;   ENTEROSCOPY N/A 05/11/2022   Procedure: ENTEROSCOPY;  Surgeon: [name], MD;  Location: [facility];  Service: Gastroenterology;  Laterality: N/A;   ESOPHAGOGASTRODUODENOSCOPY (EGD) WITH PROPOFOL N/A 08/02/2021   Procedure: ESOPHAGOGASTRODUODENOSCOPY (EGD) WITH PROPOFOL;  Surgeon: Milus Banister, MD;  [facility];  Service: Endoscopy;  Laterality: N/A;   ESOPHAGOGASTRODUODENOSCOPY (EGD) WITH PROPOFOL N/A 01/31/2022   Procedure: ESOPHAGOGASTRODUODENOSCOPY (EGD) WITH PROPOFOL;  Surgeon: Milus Banister, MD;  Location: [facility];  Service: Gastroenterology;  Laterality: N/A;   ESOPHAGOGASTRODUODENOSCOPY (EGD) WITH PROPOFOL N/A 05/07/2022   Procedure: ESOPHAGOGASTRODUODENOSCOPY (EGD) WITH PROPOFOL;  Surgeon: [name], MD;  [facility];   Service: Gastroenterology;  Laterality: N/A;   ESOPHAGOGASTRODUODENOSCOPY (EGD) WITH PROPOFOL N/A 06/13/2022   Procedure: ESOPHAGOGASTRODUODENOSCOPY (EGD) WITH PROPOFOL;  Surgeon: Rush Landmark Telford Nab., MD;  [facility];  Service: Gastroenterology;  Laterality: N/A;   EUS N/A 01/31/2022   Procedure: UPPER ENDOSCOPIC ULTRASOUND (EUS) RADIAL;  Surgeon: Milus Banister, MD;  [facility];  Service: Gastroenterology;  Laterality: N/A;   EUS N/A 06/13/2022   Procedure: UPPER ENDOSCOPIC ULTRASOUND (EUS) LINEAR;  Surgeon: Irving Copas., MD;  [facility];  Service: Gastroenterology;  Laterality: N/A;   FINE NEEDLE ASPIRATION N/A 08/02/2021   Procedure: FINE NEEDLE ASPIRATION (FNA) LINEAR;  Surgeon: Milus Banister, MD;  [facility];  Service: Endoscopy;  Laterality: N/A;   FINE NEEDLE ASPIRATION N/A 01/31/2022   Procedure: FINE NEEDLE ASPIRATION (FNA) LINEAR;  Surgeon: Milus Banister, MD;  [facility];  Service: Gastroenterology;  Laterality: N/A;   FINE NEEDLE ASPIRATION N/A 06/13/2022   Procedure: FINE NEEDLE ASPIRATION (FNA) LINEAR;  Surgeon: Irving Copas., MD;  [facility];  Service: Gastroenterology;  Laterality: N/A;   GIVENS CAPSULE STUDY N/A 05/09/2022   Procedure: GIVENS CAPSULE STUDY;  Surgeon: [name], MD;  [facility];  Service: Gastroenterology;  Laterality: N/A;   HEMOSTASIS CLIP PLACEMENT  05/08/2022   Procedure: HEMOSTASIS CLIP PLACEMENT;  Surgeon: [name], MD;  Location: [facility];  Service: Gastroenterology;;   HEMOSTASIS CLIP PLACEMENT  05/11/2022   Procedure: HEMOSTASIS CLIP PLACEMENT;  Surgeon: [name], MD;  [facility];  Service: Gastroenterology;;   HOT HEMOSTASIS N/A 06/13/2022   Procedure: HOT HEMOSTASIS (ARGON PLASMA COAGULATION/BICAP);  Surgeon: Irving Copas., MD;  Location: [facility];  Service: Gastroenterology;  Laterality:  N/A;   left kidney removed     POLYPECTOMY  05/08/2022   Procedure: POLYPECTOMY;  Surgeon: [name], MD;  Location: [facility];  Service: Gastroenterology;;   UPPER ESOPHAGEAL ENDOSCOPIC ULTRASOUND (EUS) N/A 08/02/2021   Procedure: UPPER ESOPHAGEAL ENDOSCOPIC ULTRASOUND (EUS);  Surgeon: Milus Banister, MD;  Location: [facility];  Service: Endoscopy;  Laterality: N/A;  Radial and Linear    Soc Hx- single, lives with mother. No working. Still drinking. Never attended AA   reports that he quit smoking about 5 years ago. His smoking use included cigarettes. He has a 2.50 pack-year smoking history. He has never used smokeless tobacco. He reports that he does not currently use alcohol after a past usage of about 3.0 standard drinks of alcohol per week. He reports that he does not currently use drugs after having used the following drugs: Marijuana.  Allergies  Allergen Reactions   Iohexol Other (See Comments)    Unknown reaction at 54 days old Mom at bedside reported patient was given injections of iohexol in foot and resulted in "hole in foot or foot got infected"    Family History  Problem Relation Age of Onset   Diabetes Mellitus II Mother    Colon cancer Neg Hx    Esophageal cancer Neg Hx     Prior to Admission medications   Medication Sig Start Date End Date Taking? Authorizing Provider  apixaban (ELIQUIS) 5 MG TABS tablet Take 1 tablet (5 mg total) by mouth 2 (two) times  daily. 07/30/22 10/06/22 Yes Wynona Dove A, DO  atorvastatin (LIPITOR) 20 MG tablet Take 1 tablet (20 mg total) by mouth daily. 05/29/21  Yes Charlott Rakes, MD  folic acid (FOLVITE) 1 MG tablet Take 1 tablet (1 mg total) by mouth daily. 05/13/22  Yes Hosie Poisson, MD  gabapentin (NEURONTIN) 300 MG capsule Take 1 capsule (300 mg total) by mouth at bedtime. 11/20/21  Yes Charlott Rakes, MD  glipiZIDE (GLUCOTROL) 5 MG tablet Take 1 tablet (5 mg total) by mouth 2 (two) times daily before a meal. 08/05/22   Yes Newlin, Enobong, MD  lactulose (CHRONULAC) 10 GM/15ML solution Take 30 mLs (20 g total) by mouth 3 (three) times daily. 05/12/22  Yes Hosie Poisson, MD  levETIRAcetam (KEPPRA) 500 MG tablet Take 1 tablet (500 mg total) by mouth 2 (two) times daily. 07/10/22 10/19/22 Yes Charlott Rakes, MD  metFORMIN (GLUCOPHAGE) 500 MG tablet Take 2 tablets (1,000 mg total) by mouth 2 (two) times daily with a meal. 08/05/22  Yes Newlin, Enobong, MD  metoprolol succinate (TOPROL-XL) 25 MG 24 hr tablet Take 1 tablet (25 mg total) by mouth daily. Patient taking differently: Take 25 mg by mouth every evening. 08/05/22  Yes Charlott Rakes, MD  Multiple Vitamin (MULTIVITAMIN) tablet Take 1 tablet by mouth at bedtime.   Yes [provider]  pantoprazole (PROTONIX) 40 MG tablet Take 1 tablet (40 mg total) by mouth 2 (two) times daily. Patient taking differently: Take 40 mg by mouth daily. 05/12/22  Yes Hosie Poisson, MD  potassium chloride SA (KLOR-CON M) 20 MEQ tablet Take 1 tablet (20 mEq total) by mouth 2 (two) times daily. [id number]  Yes Delora Fuel, MD  thiamine (VITAMIN B-1) 100 MG tablet Take 1 tablet (100 mg total) by mouth daily. 05/13/22  Yes Hosie Poisson, MD  levETIRAcetam (KEPPRA) 500 MG tablet Take 1 tablet (500 mg total) by mouth 2 (two) times daily. 07/30/22 09/28/22  Jeanell Sparrow, DO  naltrexone (DEPADE) 50 MG tablet Take 1 tablet (50 mg total) by mouth daily. Patient taking differently: Take 50 mg by mouth daily as needed (Takes only when feeling funky per patient). 10/03/20   Charlott Rakes, MD  predniSONE (DELTASONE) 50 MG tablet Take 1 tablet (50mg ) by mouth 13 hours prior to your procedure .   Take 1 tablet (50mg ) by mouth 7 hours prior to your procedure .    Take 1 tablet (50mg ) of prednisone and 1 tablet of Benadryl 1 hour prior to your procedure . Patient not taking: Reported on 10/06/2022 09/24/22   Mansouraty, Telford Nab., MD  sucralfate (CARAFATE) 1 GM/10ML suspension Take 10 mLs (1 g total)  by mouth 4 (four) times daily -  with meals and at bedtime. Patient not taking: Reported on 10/06/2022 05/12/22   Hosie Poisson, MD    Physical Exam: Vitals:   10/06/22 1930 10/06/22 2000 10/06/22 2045 10/06/22 2115  BP: 119/81 122/82 122/82 129/81  Pulse: 93 93 89 87  Resp: 18 16 17 16   Temp:   98.1 F (36.7 C)   SpO2: 96% 96% 97% 96%    Physical Exam Vitals and nursing note reviewed.  Constitutional:      General: He is not in acute distress.    Appearance: He is normal weight. He is ill-appearing. He is not toxic-appearing.  HENT:     Head: Normocephalic and atraumatic.     Mouth/Throat:     Pharynx: Oropharynx is clear.  Eyes:     Extraocular Movements: Extraocular  movements intact.     Pupils: Pupils are equal, round, and reactive to light.  Neck:     Thyroid: No thyromegaly.  Cardiovascular:     Rate and Rhythm: Normal rate and regular rhythm.     Pulses: Normal pulses.     Heart sounds: No murmur heard.    No gallop.  Pulmonary:     Effort: Pulmonary effort is normal. No tachypnea or accessory muscle usage.     Breath sounds: Normal breath sounds. No decreased breath sounds, wheezing, rhonchi or rales.  Chest:     Chest wall: No mass or tenderness.  Abdominal:     General: Bowel sounds are normal.     Palpations: Abdomen is soft. There is no hepatomegaly.     Tenderness: There is no abdominal tenderness. There is no guarding or rebound.  Musculoskeletal:     Cervical back: Normal range of motion and neck supple.     Right lower leg: No edema.     Left lower leg: No edema.  Skin:    General: Skin is warm and dry.  Neurological:     General: No focal deficit present.     Mental Status: He is alert.     Cranial Nerves: No cranial nerve deficit.     Motor: No weakness.  Psychiatric:        Mood and Affect: Mood normal.        Behavior: Behavior normal.      Labs on Admission: I have personally reviewed following labs and imaging studies  CBC: Recent  Labs  Lab 10/06/22 1820  WBC 9.2  NEUTROABS 6.7  HGB 10.8*  HCT 30.6*  MCV 83.8  PLT 66*   Basic Metabolic Panel: Recent Labs  Lab 10/06/22 1820  NA 128*  K 3.2*  CL 95*  CO2 18*  GLUCOSE 210*  BUN 7  CREATININE 1.32*  CALCIUM 8.7*   GFR: CrCl cannot be calculated (Unknown ideal weight.). Liver Function Tests: Recent Labs  Lab 10/06/22 1820  AST 90*  ALT 31  ALKPHOS 177*  BILITOT 19.1*  PROT 8.1  ALBUMIN 2.9*   Recent Labs  Lab 10/06/22 1820  LIPASE 17   Recent Labs  Lab 10/06/22 1820  AMMONIA 157*   Coagulation Profile: Recent Labs  Lab 10/06/22 1820  INR 1.5*   Cardiac Enzymes: No results for input(s): "CKTOTAL", "CKMB", "CKMBINDEX", "TROPONINI" in the last 168 hours. BNP (last 3 results) Recent Labs    05/03/22 1002  PROBNP <36   HbA1C: No results for input(s): "HGBA1C" in the last 72 hours. CBG: No results for input(s): "GLUCAP" in the last 168 hours. Lipid Profile: No results for input(s): "CHOL", "HDL", "LDLCALC", "TRIG", "CHOLHDL", "LDLDIRECT" in the last 72 hours. Thyroid Function Tests: No results for input(s): "TSH", "T4TOTAL", "FREET4", "T3FREE", "THYROIDAB" in the last 72 hours. Anemia Panel: No results for input(s): "VITAMINB12", "FOLATE", "FERRITIN", "TIBC", "IRON", "RETICCTPCT" in the last 72 hours. Urine analysis:    Component Value Date/Time   COLORURINE AMBER (A) 10/06/2022 2005   APPEARANCEUR HAZY (A) 10/06/2022 2005   LABSPEC 1.014 10/06/2022 2005   PHURINE 6.0 10/06/2022 2005   GLUCOSEU NEGATIVE 10/06/2022 2005   HGBUR NEGATIVE 10/06/2022 2005   BILIRUBINUR MODERATE (A) 10/06/2022 2005   [place] NEGATIVE 10/06/2022 2005   PROTEINUR 30 (A) 10/06/2022 2005   NITRITE NEGATIVE 10/06/2022 2005   LEUKOCYTESUR NEGATIVE 10/06/2022 2005    Radiological Exams on Admission: I have personally reviewed images CT ABDOMEN PELVIS WO  CONTRAST  Result Date: 10/06/2022 CLINICAL DATA:  One-week history of nausea, vomiting,  and shortness of breath EXAM: CT ABDOMEN AND PELVIS WITHOUT CONTRAST TECHNIQUE: Multidetector CT imaging of the abdomen and pelvis was performed following the standard protocol without IV contrast. RADIATION DOSE REDUCTION: This exam was performed according to the departmental dose-optimization program which includes automated exposure control, adjustment of the mA and/or kV according to patient size and/or use of iterative reconstruction technique. COMPARISON:  CT abdomen and pelvis dated 07/18/2022 FINDINGS: Lower chest: No focal consolidation or pulmonary nodule in the lung bases. No pleural effusion or pneumothorax demonstrated. Partially imaged heart size is normal. Hepatobiliary: Cirrhotic morphology. No focal hepatic lesions. No intra or extrahepatic biliary ductal dilation. Cholelithiasis. Pancreas: Essentially unchanged 4.5 x 4.0 cm cystic focus in the pancreatic body with upstream pancreatic ductal dilation, not well evaluated on this noncontrast enhanced examination. Spleen: Normal in size without focal abnormality. Adrenals/Urinary Tract: No adrenal nodules. Left kidney is absent. No suspicious renal mass, calculi or hydronephrosis. No focal bladder wall thickening. Stomach/Bowel: Normal appearance of the stomach. Endoscopy clip is again seen within the duodenum. No evidence of bowel wall thickening, distention, or inflammatory changes. Normal appendix. Vascular/Lymphatic: Aortic atherosclerosis. 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 No enlarged abdominal or pelvic lymph nodes. Reproductive: Prostate is unremarkable. Other: No free fluid, fluid collection, or free air. Musculoskeletal: No acute or abnormal lytic or blastic osseous lesions. IMPRESSION: 1. No acute abdominopelvic findings. 2. Cirrhosis with sequela of portal hypertension including recanalized paraumbilical vein and caput medusae varices. 3. Essentially unchanged 4.5 cm cystic focus in the pancreatic body with upstream  pancreatic ductal dilation, not well evaluated on this noncontrast enhanced examination. 4.  Aortic Atherosclerosis (ICD10-I70.0). Electronically Signed   By: Darrin Nipper M.D.   On: 10/06/2022 20:49   DG Chest 2 View  Result Date: 10/06/2022 CLINICAL DATA:  Shortness of breath. EXAM: CHEST - 2 VIEW COMPARISON:  07/29/2022 FINDINGS: Stable upper normal heart size.The cardiomediastinal contours are normal. The lungs are clear. Pulmonary vasculature is normal. No consolidation, pleural effusion, or pneumothorax. Chronic midthoracic compression deformities. No acute osseous abnormalities are seen. IMPRESSION: No acute chest findings. Electronically Signed   By: Keith Rake M.D.   On: 10/06/2022 18:40    EKG: I have personally reviewed EKG: SR w/o acute changes  Assessment/Plan Principal Problem:   Acute blood loss anemia Active Problems:   Alcoholic hepatitis   Anemia   GERD (gastroesophageal reflux disease)   Type 2 diabetes mellitus   Hyperammonemia   Paroxysmal atrial fibrillation    Assessment and Plan: Alcoholic hepatitis Probable decompensation of alcoholic cirrhosis with elevated NH3 with clouded mentation, electrolyte derangement due to related N/V. He reports his last drink was 6 days ago and that he doesn't drink anywhere near as much as he used to. He does not take naltrexone on a regular basis.   Plan Med-surg admit  IVF for electrolyte replacement  Continue lactulose  Consider adding Xifaxan if GI agrees  GI consult  Advised to consider AA to help with complete abstention from EtOH  Hyperammonemia NH3 157. He reports taking lactulose most of the time.  Plan Continue lactulose TID  Consider adding Xifaxan if ok with GI  Type 2 diabetes mellitus Last A1C 9%. Patient reports he is adherent to his medical regimen  Plan Hold metformin and glucotrol in the acute setting with N/V and poor po intake  Low dose basal insulin Sliding scale coverage  GERD (gastroesophageal  reflux disease) Long standing problem. Now with possible GI blood loss  Plan Protonix 40 mg q12  Anemia Patient with h/o blood loss anemia. Hgb at 10.8 down from 12.3 on Feb 5, 24. He is taking eliquis  and also has INR 1.5 2/2 cirrhosis. He denies hematemesis but endorses having intermittent dark stools. Has had bleeding erosions associated with gastropathy 2/2 portal hypertension.  Plan Heme test stool Hold Eliquis  H/H q 12  GI consult  Paroxysmal atrial fibrillation Patient in sinus rhythm at exam. Had 2D echo 09/13/22 with nl EF, no wall motion abnormalities, nl diastolic function.  Plan No indication for tele admit  Continue home medications  Hold Eliquis in potential GI bleed       DVT prophylaxis:  TEDs Code Status: Full Code Family Communication: mother present during interview and exam  Disposition Plan: home when medically stable  Consults called: GI - on call for [place] - EDP placed call  Admission status: Inpatient, Med-Surg   Adella Hare, MD [facility] 10/07/2022, 12:21 AM

## 2022-10-08 ENCOUNTER — Inpatient Hospital Stay (HOSPITAL_COMMUNITY): Payer: 59

## 2022-10-08 DIAGNOSIS — E871 Hypo-osmolality and hyponatremia: Secondary | ICD-10-CM

## 2022-10-08 DIAGNOSIS — K766 Portal hypertension: Secondary | ICD-10-CM

## 2022-10-08 DIAGNOSIS — D6959 Other secondary thrombocytopenia: Secondary | ICD-10-CM

## 2022-10-08 DIAGNOSIS — K7030 Alcoholic cirrhosis of liver without ascites: Secondary | ICD-10-CM

## 2022-10-08 DIAGNOSIS — R4182 Altered mental status, unspecified: Secondary | ICD-10-CM

## 2022-10-08 LAB — CBC WITH DIFFERENTIAL/PLATELET
Abs Immature Granulocytes: 0.05 10*3/uL (ref 0.00–0.07)
Basophils Absolute: 0 10*3/uL (ref 0.0–0.1)
Basophils Relative: 0 %
Eosinophils Absolute: 0 10*3/uL (ref 0.0–0.5)
Eosinophils Relative: 0 %
HCT: 33.4 % — ABNORMAL LOW (ref 39.0–52.0)
Hemoglobin: 11.9 g/dL — ABNORMAL LOW (ref 13.0–17.0)
Immature Granulocytes: 1 %
Lymphocytes Relative: 14 %
Lymphs Abs: 1.2 10*3/uL (ref 0.7–4.0)
MCH: 29.7 pg (ref 26.0–34.0)
MCHC: 35.6 g/dL (ref 30.0–36.0)
MCV: 83.3 fL (ref 80.0–100.0)
Monocytes Absolute: 0.4 10*3/uL (ref 0.1–1.0)
Monocytes Relative: 5 %
Neutro Abs: 6.4 10*3/uL (ref 1.7–7.7)
Neutrophils Relative %: 80 %
Platelets: 85 10*3/uL — ABNORMAL LOW (ref 150–400)
RBC: 4.01 MIL/uL — ABNORMAL LOW (ref 4.22–5.81)
RDW: 24.9 % — ABNORMAL HIGH (ref 11.5–15.5)
Smear Review: NORMAL
WBC: 8 10*3/uL (ref 4.0–10.5)
nRBC: 0 % (ref 0.0–0.2)

## 2022-10-08 LAB — COMPREHENSIVE METABOLIC PANEL
ALT: 31 U/L (ref 0–44)
AST: 62 U/L — ABNORMAL HIGH (ref 15–41)
Albumin: 2.8 g/dL — ABNORMAL LOW (ref 3.5–5.0)
Alkaline Phosphatase: 188 U/L — ABNORMAL HIGH (ref 38–126)
Anion gap: 10 (ref 5–15)
BUN: 5 mg/dL — ABNORMAL LOW (ref 6–20)
CO2: 19 mmol/L — ABNORMAL LOW (ref 22–32)
Calcium: 9.2 mg/dL (ref 8.9–10.3)
Chloride: 97 mmol/L — ABNORMAL LOW (ref 98–111)
Creatinine, Ser: 0.63 mg/dL (ref 0.61–1.24)
GFR, Estimated: >60 mL/min (ref >60)
Glucose, Bld: 201 mg/dL — ABNORMAL HIGH (ref 70–99)
Potassium: 4.2 mmol/L (ref 3.5–5.1)
Sodium: 126 mmol/L — ABNORMAL LOW (ref 135–145)
Total Bilirubin: 18.7 mg/dL (ref 0.3–1.2)
Total Protein: 8.4 g/dL — ABNORMAL HIGH (ref 6.5–8.1)

## 2022-10-08 LAB — BILIRUBIN, DIRECT: Bilirubin, Direct: 9.9 mg/dL — ABNORMAL HIGH (ref 0.0–0.2)

## 2022-10-08 LAB — GLUCOSE, CAPILLARY
Glucose-Capillary: 169 mg/dL — ABNORMAL HIGH (ref 70–99)
Glucose-Capillary: 180 mg/dL — ABNORMAL HIGH (ref 70–99)
Glucose-Capillary: 220 mg/dL — ABNORMAL HIGH (ref 70–99)
Glucose-Capillary: 237 mg/dL — ABNORMAL HIGH (ref 70–99)
Glucose-Capillary: 239 mg/dL — ABNORMAL HIGH (ref 70–99)
Glucose-Capillary: 293 mg/dL — ABNORMAL HIGH (ref 70–99)
Glucose-Capillary: 337 mg/dL — ABNORMAL HIGH (ref 70–99)
Glucose-Capillary: 382 mg/dL — ABNORMAL HIGH (ref 70–99)

## 2022-10-08 LAB — IRON AND TIBC
Iron: 67 ug/dL (ref 45–182)
Saturation Ratios: 19 % (ref 17.9–39.5)
TIBC: 346 ug/dL (ref 250–450)
UIBC: 279 ug/dL

## 2022-10-08 LAB — FERRITIN: Ferritin: 104 ng/mL (ref 24–336)

## 2022-10-08 LAB — FOLATE: Folate: 17.7 ng/mL (ref >5.9)

## 2022-10-08 LAB — VITAMIN B12: Vitamin B-12: 3673 pg/mL — ABNORMAL HIGH (ref 180–914)

## 2022-10-08 MED ORDER — DIPHENHYDRAMINE HCL 25 MG PO CAPS
50.0000 mg | ORAL_CAPSULE | Freq: Once | ORAL | Status: AC
  Administered 2022-10-08: 50 mg via ORAL
  Filled 2022-10-08: qty 2

## 2022-10-08 MED ORDER — DIPHENHYDRAMINE HCL 25 MG PO CAPS: 50.0000 mg | ORAL_CAPSULE | Freq: Once | ORAL | Status: DC

## 2022-10-08 MED ORDER — IOHEXOL 350 MG/ML SOLN
60.0000 mL | Freq: Once | INTRAVENOUS | Status: AC | PRN
  Administered 2022-10-08: 60 mL via INTRAVENOUS

## 2022-10-08 MED ORDER — PREDNISOLONE 5 MG PO TABS
40.0000 mg | ORAL_TABLET | Freq: Every day | ORAL | Status: DC
  Administered 2022-10-09: 40 mg via ORAL
  Filled 2022-10-08: qty 8

## 2022-10-08 MED ORDER — PREDNISONE 50 MG PO TABS: 50.0000 mg | ORAL_TABLET | Freq: Four times a day (QID) | ORAL | Status: DC

## 2022-10-08 MED ORDER — INSULIN ASPART 100 UNIT/ML IJ SOLN: 0.0000 [IU] | Freq: Every day | INTRAMUSCULAR | Status: DC

## 2022-10-08 MED ORDER — INSULIN ASPART 100 UNIT/ML IJ SOLN
0.0000 [IU] | Freq: Three times a day (TID) | INTRAMUSCULAR | Status: DC
  Administered 2022-10-08: 15 [IU] via SUBCUTANEOUS
  Administered 2022-10-08: 11 [IU] via SUBCUTANEOUS
  Administered 2022-10-09: 2 [IU] via SUBCUTANEOUS
  Administered 2022-10-09: 8 [IU] via SUBCUTANEOUS

## 2022-10-08 MED ORDER — METHYLPREDNISOLONE SODIUM SUCC 40 MG IJ SOLR
40.0000 mg | Freq: Once | INTRAMUSCULAR | Status: AC
  Administered 2022-10-08: 40 mg via INTRAVENOUS
  Filled 2022-10-08: qty 1

## 2022-10-08 MED ORDER — MAGNESIUM SULFATE 4 GM/100ML IV SOLN
4.0000 g | Freq: Once | INTRAVENOUS | Status: AC
  Administered 2022-10-08: 4 g via INTRAVENOUS
  Filled 2022-10-08: qty 100

## 2022-10-08 MED ORDER — INSULIN GLARGINE-YFGN 100 UNIT/ML ~~LOC~~ SOLN
10.0000 [IU] | Freq: Every day | SUBCUTANEOUS | Status: DC
  Administered 2022-10-08: 10 [IU] via SUBCUTANEOUS
  Filled 2022-10-08 (×2): qty 0.1

## 2022-10-08 MED ORDER — ENSURE ENLIVE PO LIQD
237.0000 mL | Freq: Two times a day (BID) | ORAL | Status: DC
  Administered 2022-10-08 – 2022-10-09 (×3): 237 mL via ORAL

## 2022-10-08 MED ORDER — INSULIN ASPART 100 UNIT/ML IJ SOLN
3.0000 [IU] | Freq: Three times a day (TID) | INTRAMUSCULAR | Status: DC
  Administered 2022-10-08 – 2022-10-09 (×3): 3 [IU] via SUBCUTANEOUS

## 2022-10-08 MED ORDER — PREDNISOLONE 5 MG PO TABS: 40.0000 mg | ORAL_TABLET | Freq: Every day | ORAL | Status: DC

## 2022-10-08 NOTE — Inpatient Diabetes Management (Signed)
Inpatient Diabetes Program Recommendations  AACE/ADA: New Consensus Statement on Inpatient Glycemic Control   Target Ranges:  Prepandial:   less than 140 mg/dL      Peak postprandial:   less than 180 mg/dL (1-2 hours)      Critically ill patients:  140 - 180 mg/dL    Latest Reference Range & Units 10/07/22 08:06 10/07/22 08:33 10/07/22 09:48 10/07/22 11:57 10/07/22 16:40 10/07/22 20:07 10/08/22 00:07 10/08/22 03:52 10/08/22 08:32 10/08/22 11:41  Glucose-Capillary 70 - 99 mg/dL 71 69 (L) 131 (H) 89 196 (H) 118 (H) 239 (H) 293 (H) 169 (H) 337 (H)    Review of Glycemic Control  Diabetes history: DM2 Outpatient Diabetes medications: Glipizide 5 mg BID, Metformin 1000 mg BID Current orders for Inpatient glycemic control: Semglee 10 units QHS, Novolog 0-15 units TID with meals, Novolog 0-5 units QHS; Prednisolone 40 mg daily  Inpatient Diabetes Program Recommendations:    Insulin: If steroids are continued, please consider ordering Novolog 3 units TID with meals for meal coverage if patient eats at least 50% of meals.  NOTE: Noted hypoglycemia on 10/07/22; likely from NPO status and perhaps from effects of outpatient DM medications.CBG 169 mg/dl today at 8:32 am and 337 mg/dl at 11:41 today.   Thanks, Barnie Alderman, RN, MSN, [place] Diabetes Coordinator Inpatient Diabetes Program [phone] (Team Pager from 8am to [place])

## 2022-10-08 NOTE — Progress Notes (Signed)
[facility] Progress Note  CC:  Alcoholic associated cirrhosis, hyperbilirubinemia, anorexia, nausea and vomiting   Subjective: He denies having any nausea or vomiting.  No abdominal pain.  He passed a light brown nonbloody bowel movement earlier today.  No chest pain or shortness of breath.   Objective:  Vital signs in last 24 hours: Temp:  [98.2 F (36.8 C)-98.6 F (37 C)] 98.2 F (36.8 C) (04/02 0828) Pulse Rate:  [66-100] 69 (04/02 0828) Resp:  [17] 17 (04/02 0828) BP: (106-131)/(74-87) 128/86 (04/02 0828) SpO2:  [99 %-100 %] 100 % (04/02 0828) Last BM Date : 10/06/22 General: Alert 52 year old male in no acute distress. Heart: Regular rate and rhythm, no murmurs. Pulm: Breath sounds clear throughout. Abdomen: Soft, mildly protuberant.  No ascites.  Nontender.  No palpable mass.  No hepatosplenomegaly.  Positive bowel sounds all 4 quadrants. Extremities: No edema. Neurologic:  Alert and  oriented x 4.  Speech is clear.  Moves all extremities equally.  No asterixis. Psych:  Alert and cooperative. Normal mood and affect.  Intake/Output from previous day: 04/01 0701 - 04/02 0700 In: -  Out: 425 [Urine:425] Intake/Output this shift: Total I/O In: 240 [P.O.:240] Out: 200 [Urine:200]  Lab Results: Recent Labs    10/06/22 1820 10/07/22 0643 10/07/22 1823 10/08/22 0754  WBC 9.2  --   --  8.0  HGB 10.8* 11.0* 10.5* 11.9*  HCT 30.6* 32.2* 30.1* 33.4*  PLT 66*  --   --  85*   BMET Recent Labs    10/06/22 1820 10/07/22 0643 10/08/22 0754  NA 128* 131* 126*  K 3.2* 3.1* 4.2  CL 95* 98 97*  CO2 18* 19* 19*  GLUCOSE 210* 68* 201*  BUN 7 6 5*  CREATININE 1.32* 0.66 0.63  CALCIUM 8.7* 8.5* 9.2   LFT Recent Labs    10/08/22 0754  PROT 8.4*  ALBUMIN 2.8*  AST 62*  ALT 31  ALKPHOS 188*  BILITOT 18.7*   PT/INR Recent Labs    10/06/22 1820  LABPROT 18.3*  INR 1.5*   Hepatitis Panel Recent Labs    10/06/22 1820  HEPBSAG NON  REACTIVE  HCVAB NON REACTIVE  HEPAIGM NON REACTIVE  HEPBIGM NON REACTIVE    CT ABDOMEN PELVIS WO CONTRAST  Result Date: 10/06/2022 CLINICAL DATA:  One-week history of nausea, vomiting, and shortness of breath EXAM: CT ABDOMEN AND PELVIS WITHOUT CONTRAST TECHNIQUE: Multidetector CT imaging of the abdomen and pelvis was performed following the standard protocol without IV contrast. RADIATION DOSE REDUCTION: This exam was performed according to the departmental dose-optimization program which includes automated exposure control, adjustment of the mA and/or kV according to patient size and/or use of iterative reconstruction technique. COMPARISON:  CT abdomen and pelvis dated 07/18/2022 FINDINGS: Lower chest: No focal consolidation or pulmonary nodule in the lung bases. No pleural effusion or pneumothorax demonstrated. Partially imaged heart size is normal. Hepatobiliary: Cirrhotic morphology. No focal hepatic lesions. No intra or extrahepatic biliary ductal dilation. Cholelithiasis. Pancreas: Essentially unchanged 4.5 x 4.0 cm cystic focus in the pancreatic body with upstream pancreatic ductal dilation, not well evaluated on this noncontrast enhanced examination. Spleen: Normal in size without focal abnormality. Adrenals/Urinary Tract: No adrenal nodules. Left kidney is absent. No suspicious renal mass, calculi or hydronephrosis. No focal bladder wall thickening. Stomach/Bowel: Normal appearance of the stomach. Endoscopy clip is again seen within the duodenum. No evidence of bowel wall thickening, distention, or inflammatory changes. Normal appendix. Vascular/Lymphatic: Aortic atherosclerosis. Recanalized paraumbilical  vein and caput medusae varices. No enlarged abdominal or pelvic lymph nodes. Reproductive: Prostate is unremarkable. Other: No free fluid, fluid collection, or free air. Musculoskeletal: No acute or abnormal lytic or blastic osseous lesions. IMPRESSION: 1. No acute abdominopelvic findings. 2.  Cirrhosis with sequela of portal hypertension including recanalized paraumbilical vein and caput medusae varices. 3. Essentially unchanged 4.5 cm cystic focus in the pancreatic body with upstream pancreatic ductal dilation, not well evaluated on this noncontrast enhanced examination. 4.  Aortic Atherosclerosis (ICD10-I70.0). Electronically Signed   By: Darrin Nipper M.D.   On: 10/06/2022 20:49   DG Chest 2 View  Result Date: 10/06/2022 CLINICAL DATA:  Shortness of breath. EXAM: CHEST - 2 VIEW COMPARISON:  07/29/2022 FINDINGS: Stable upper normal heart size.The cardiomediastinal contours are normal. The lungs are clear. Pulmonary vasculature is normal. No consolidation, pleural effusion, or pneumothorax. Chronic midthoracic compression deformities. No acute osseous abnormalities are seen. IMPRESSION: No acute chest findings. Electronically Signed   By: Keith Rake M.D.   On: 10/06/2022 18:40    Assessment / Plan:   1. Alcohol associated hepatitis: History of alcoholic cirrhosis with portal hypertension, varices, portal hypertensive gastropathy admitted with elevated ammonia and altered mental status, electrolyte derangement likely from nausea and vomiting. Last alcohol intake was 3/30. Admission DF 43.5, MELD-NA 29, suspect acute alcoholic hepatitis on chronic alcoholic cirrhosis. T. Bili 19.1 -> 18.7. Alk phos 188. AST 90 -> 62. ALT 31.  No further N/V.  -Add direct bili to prior a.m. lab draw -CTAP with IV contast (patient declined MRI/MRCP as he is claustrophobic) to assess if his pancreatic cyst is enlarging, resulting in a biliary obstruction.  Patient will require steroid premed due to history of contrast allergy.  Patient is already on prednisolone 40 mg daily, I will consult with pharmacy to verify appropriate steroid premed) -2 g low-sodium diet -Continue lactulose 20 g 3 times daily titrate to no more than 3-4 loose bowel movements daily -Continue Xifaxan 550 mg p.o. twice daily -Continue  Prednisolone 40 mg daily, day #2.  Check [name] score day # 7 -CIWA protocol in process -Patient counseled no alcohol  2.  Hyperammonemia: Ammonia 157. On Lactulose tid.  No overt hepatic encephalopathy at this time.  3.  Type 2 diabetes  4.  GERD: Typically controlled on Pantoprazole 40 twice daily  5.  A-fib: Eliquis on hold.   6.  Anemia: History of blood loss anemia with hemoglobin at 10.8 down from 12.3 on 08/12/2022 -> today Hg 11.9.  Iron 67.  Saturation ratios 19%.  B12 level 3673.  Folate 17.7.  Eliquis on hold.  INR 1.5. He endorses passing intermittent dark stools.  He had a light brown stool earlier today. EGD/EUS 06/2022 showed bleeding erosions with associated gastropathy and portal hypertension.  7.  Pancreatic cystic lesion: Status post EUS with FNA but no malignancy, was due for repeat imaging, had repeat CT at admission but was noncontrast. -CTAP as noted above  8.  Thrombocytopenia secondary to cirrhosis  9.  Hyponatremia secondary to cirrhosis    Principal Problem:   Acute blood loss anemia Active Problems:   Alcoholic hepatitis   Anemia   GERD (gastroesophageal reflux disease)   Type 2 diabetes mellitus   Hyperammonemia   Paroxysmal atrial fibrillation     LOS: 1 day   Noralyn Pick  10/08/2022, 1:03 PM

## 2022-10-08 NOTE — Social Work (Signed)
CSW acknowledges consult for Substance Use resources and counseling. Pt confirms his last drink was 8 days ago, states he has been sober before. Pt advises CSW that his drinking is not a problem, he can stay sober without resources and does not want any. TOC will be available for any further needs.

## 2022-10-08 NOTE — Progress Notes (Addendum)
PROGRESS NOTE  Gary Mitchell [id number] DOB: 05/31/1971 DOA: 10/06/2022 PCP: Charlott Rakes, MD  HPI/Recap of past 24 hours: Gary Mitchell, a 52 y/o with h/o alcoholic cirrhosis with portal hypertension, varices and gastropathy with recent EGD Dec'23 revealing erosions, hyperammonemia, DM2, PAF on Eliquis. For the past week he has been having N/V with decreased PO intake, increased weakness and marked DOE. He denies fever, chills, hematemesis, does endorse intermittent dark stools, denies chest pain, cough. Of note he had a 2D echo 09/13/22 that was normal. Due to his persistent symptoms he presents to [facility] for evaluation. In the ED, VSS, labs notable for elevated ammonia, LFTs, elevated INR 1.5. CT abd/pelvis - no acute findings, cirrhosis, portal hypertension, persistent pancreatic cyst.  GI consulted.  Patient admitted for further management.    Pt denies any new complaints. Hasn't ambulated the hallway yet.    Assessment/Plan: Principal Problem:   Acute blood loss anemia Active Problems:   Alcoholic hepatitis   Anemia   GERD (gastroesophageal reflux disease)   Type 2 diabetes mellitus   Hyperammonemia   Paroxysmal atrial fibrillation   Alcoholic hepatitis on chronic alcohol cirrhosis Alcohol abuse Still consuming alcohol (??last drink on 3/30) Awake, alert, oriented x 4 Transaminitis Elevated ammonia, INR CT abd/pelvis - no acute findings, cirrhosis, portal hypertension, persistent pancreatic cyst, GI repeating CT abd to further evaluate the pancreatic cyst GI on board, appreciate recs, started prednisolone 40 mg X 7 days Continue lactulose, start Xifaxan TOC for substance abuse counseling, advised to quit CIWA protocol Thiamine, multivitamins, folic acid   Type 2 diabetes mellitus Last A1C 9%. Patient reports he is adherent to his medical regimen Noted hypoglycemic episodes likely due to n.p.o. status, now with hyperglycemia on steroids  SSI, Semglee, novolog TID,  hypoglycemic protocol  Hypokalemia/Hypomagnesemia  Replace as needed  Normocytic anemia History of iron deficiency anemia Baseline hemoglobin between 8-10 Anemia panel unremarkable  Hyponatremia Likely 2/2 liver cirrhosis Daily CMP  Paroxysmal atrial fibrillation Rate controlled, SR 2D echo 09/13/22 with nl EF, no wall motion abnormalities, nl diastolic function Hold Eliquis for now   GERD PPI    Estimated body mass index is 22.48 kg/m as calculated from the following:   Height as of this encounter: 5\' 4"  (1.626 m).   Weight as of this encounter: 59.4 kg.     Code Status: Full  Family Communication: None at bedside  Disposition Plan: Status is: Inpatient Remains inpatient appropriate because: Level of care      Consultants: GI  Procedures: None  Antimicrobials: None  DVT prophylaxis:  SCDs   Objective: Vitals:   10/07/22 1644 10/07/22 1647 10/08/22 0354 10/08/22 0828  BP: 131/87 131/87 106/74 128/86  Pulse: 100  66 69  Resp: 17 17  17   Temp: 98.6 F (37 C) 98.6 F (37 C) 98.6 F (37 C) 98.2 F (36.8 C)  TempSrc: Oral Oral Oral Oral  SpO2: 100% 100% 99% 100%  Weight:      Height:        Intake/Output Summary (Last 24 hours) at 10/08/2022 1517 Last data filed at 10/08/2022 1142 Gross per 24 hour  Intake 240 ml  Output 625 ml  Net -385 ml   Filed Weights   10/07/22 0118  Weight: 59.4 kg    Exam: General: NAD  Cardiovascular: S1, S2 present Respiratory: CTAB Abdomen: Soft, nontender, nondistended, bowel sounds present Musculoskeletal: No bilateral pedal edema noted Skin: Normal Psychiatry: Normal mood     Data Reviewed: CBC:  Recent Labs  Lab 10/06/22 1820 10/07/22 0643 10/07/22 1823 10/08/22 0754  WBC 9.2  --   --  8.0  NEUTROABS 6.7  --   --  6.4  HGB 10.8* 11.0* 10.5* 11.9*  HCT 30.6* 32.2* 30.1* 33.4*  MCV 83.8  --   --  83.3  PLT 66*  --   --  85*   Basic Metabolic Panel: Recent Labs  Lab 10/06/22 1820  10/07/22 0643 10/07/22 1823 10/08/22 0754  NA 128* 131*  --  126*  K 3.2* 3.1*  --  4.2  CL 95* 98  --  97*  CO2 18* 19*  --  19*  GLUCOSE 210* 68*  --  201*  BUN 7 6  --  5*  CREATININE 1.32* 0.66  --  0.63  CALCIUM 8.7* 8.5*  --  9.2  MG  --   --  1.1*  --    GFR: Estimated Creatinine Clearance: 93.5 mL/min (by C-G formula based on SCr of 0.63 mg/dL). Liver Function Tests: Recent Labs  Lab 10/06/22 1820 10/08/22 0754  AST 90* 62*  ALT 31 31  ALKPHOS 177* 188*  BILITOT 19.1* 18.7*  PROT 8.1 8.4*  ALBUMIN 2.9* 2.8*   Recent Labs  Lab 10/06/22 1820  LIPASE 17   Recent Labs  Lab 10/06/22 1820  AMMONIA 157*   Coagulation Profile: Recent Labs  Lab 10/06/22 1820  INR 1.5*   Cardiac Enzymes: No results for input(s): "CKTOTAL", "CKMB", "CKMBINDEX", "TROPONINI" in the last 168 hours. BNP (last 3 results) Recent Labs    05/03/22 1002  PROBNP <36   HbA1C: No results for input(s): "HGBA1C" in the last 72 hours. CBG: Recent Labs  Lab 10/07/22 2007 10/08/22 0007 10/08/22 0352 10/08/22 0832 10/08/22 1141  GLUCAP 118* 239* 293* 169* 337*   Lipid Profile: No results for input(s): "CHOL", "HDL", "LDLCALC", "TRIG", "CHOLHDL", "LDLDIRECT" in the last 72 hours. Thyroid Function Tests: No results for input(s): "TSH", "T4TOTAL", "FREET4", "T3FREE", "THYROIDAB" in the last 72 hours. Anemia Panel: Recent Labs    10/08/22 0754  VITAMINB12 3,673*  FOLATE 17.7  FERRITIN 104  TIBC 346  IRON 67   Urine analysis:    Component Value Date/Time   COLORURINE AMBER (A) 10/06/2022 2005   APPEARANCEUR HAZY (A) 10/06/2022 2005   LABSPEC 1.014 10/06/2022 2005   PHURINE 6.0 10/06/2022 2005   GLUCOSEU NEGATIVE 10/06/2022 2005   HGBUR NEGATIVE 10/06/2022 2005   BILIRUBINUR MODERATE (A) 10/06/2022 2005   [place] NEGATIVE 10/06/2022 2005   PROTEINUR 30 (A) 10/06/2022 2005   NITRITE NEGATIVE 10/06/2022 2005   LEUKOCYTESUR NEGATIVE 10/06/2022 2005   Sepsis  Labs: @LABRCNTIP (procalcitonin:4,lacticidven:4)  ) Recent Results (from the past 240 hour(s))  SARS Coronavirus 2 by RT PCR (hospital order, performed in [facility] lab) *cepheid single result test* Anterior Nasal Swab     Status: None   Collection Time: 10/06/22  6:20 PM   Specimen: Anterior Nasal Swab  Result Value Ref Range Status   SARS Coronavirus 2 by RT PCR NEGATIVE NEGATIVE Final    Comment: Performed at [facility], [place] [address], [place], [place] [address]      Studies: No results found.  Scheduled Meds:  atorvastatin  20 mg Oral Daily   diphenhydrAMINE  50 mg Oral Once   feeding supplement  237 mL Oral BID BM   folic acid  1 mg Oral Daily   gabapentin  300 mg Oral QHS   insulin aspart  0-15 Units Subcutaneous TID WC   insulin aspart  0-5 Units Subcutaneous QHS   insulin aspart  3 Units Subcutaneous TID WC   insulin glargine-yfgn  10 Units Subcutaneous QHS   lactulose  20 g Oral TID   levETIRAcetam  500 mg Oral BID   metoprolol succinate  25 mg Oral QPM   multivitamin with minerals  1 tablet Oral Daily   pantoprazole  40 mg Oral BID   potassium chloride SA  20 mEq Oral BID   [START ON 10/09/2022] prednisoLONE  40 mg Oral Daily   rifaximin  550 mg Oral BID   thiamine  100 mg Oral Daily    Continuous Infusions:     LOS: 1 day     Alma Friendly, MD [facility]  If 7PM-7AM, please contact night-coverage [email or web address] 10/08/2022, 3:17 PM

## 2022-10-08 NOTE — Progress Notes (Signed)
Initial Nutrition Assessment  DOCUMENTATION CODES:   Non-severe (moderate) malnutrition in context of chronic illness  INTERVENTION:  Liberalize diet to carb modified  Ensure Plus High Protein po BID, each supplement provides 350 kcal and 20 grams of protein. Carnation Breakfast Essentials TID, each packet mixed with 8 ounces of 2% milk provides 13 grams of protein and 260 calories. Continue MVI with minerals daily  NUTRITION DIAGNOSIS:   Moderate Malnutrition related to chronic illness (alcoholic cirrhosis) as evidenced by percent weight loss, mild fat depletion, moderate muscle depletion, mild muscle depletion.  GOAL:   Patient will meet greater than or equal to 90% of their needs  MONITOR:   PO intake, Supplement acceptance, Labs, Weight trends  REASON FOR ASSESSMENT:   Malnutrition Screening Tool    ASSESSMENT:   Pt admitted from home with ABLA. PMH significant for alcoholic cirrhosis with portal hypertension, varices, gastropathy with recent EGD (06/2022) revealing erosions, hyperammonemia, T2DM, PAF.   Pt sitting up in bed at time of visit. He states that he "eats like a baby" in general. He has small portions and eats about 2 times per day typically. Within the last week he states that he would go 2 days without eating and then would eat 1 day d/t episodes of n/v which he reports resolved just PTA. D/t decreased intake and limited fluid intake, he reports feeling dehydrated. He denies intake of alcohol, water, juice, sodas, rehydration drinks within the last week. He denies difficulty chewing/swallowing foods.   Pt states that he does not like to eat meat much anymore. He enjoys eating salmon and shrimp. Does not often eat beans. Encouraged adequate intake of protein with each meal/snack.   Discussed different nutrition supplements. He has tried Ensure and is agreeable to receiving these although states that sometimes they are too sweet. He is agreeable to trying [employer] as well to determine which he enjoys better.   Pt states that he has never been "big." He recalls a usual weight of 150 lbs and suspects a 20 lb weight loss within the last 8 months although denies changes to the way his clothes fit. He does not have a scale at home so therefore does not weight himself often.  Reviewed weight history. Pt weight appeared to remain stable between 63-65 kg in 2023. His weight is noted to have declined within the last 3 months. Noted a weight loss of 9.7% since 1/22 which is clinically significant for time frame.   Medications: folvite, SSI 0-15 units TID, SSI 0-5 units qhs, semglee 10 units daily, lactulose, MVI, protonix, klor-con, prednisolone, thiamine, IV MgSulfate once  Labs: sodium 126, BUN 5, Mg 1.1 (repleted), alk phos 188, AST 62, ammonia 157 (03/31), CBG's 89-293 x24 hours  NUTRITION - FOCUSED PHYSICAL EXAM:  Flowsheet Row Most Recent Value  Orbital Region Mild depletion  Upper Arm Region Severe depletion  Thoracic and Lumbar Region Mild depletion  Buccal Region No depletion  Temple Region No depletion  Clavicle Bone Region Severe depletion  Clavicle and Acromion Bone Region Moderate depletion  Scapular Bone Region Mild depletion  Dorsal Hand No depletion  Patellar Region Mild depletion  Anterior Thigh Region Moderate depletion  Posterior Calf Region Mild depletion  Edema (RD Assessment) None  Hair Reviewed  Eyes Reviewed  Mouth Reviewed  Skin Reviewed  Nails Reviewed       Diet Order:   Diet Order             Diet Carb Modified  Fluid consistency: Thin; Room service appropriate? Yes  Diet effective now                   EDUCATION NEEDS:   Education needs have been addressed  Skin:  Skin Assessment: Reviewed RN Assessment  Last BM:  3/31  Height:   Ht Readings from Last 1 Encounters:  10/07/22 5\' 4"  (1.626 m)    Weight:   Wt Readings from Last 1 Encounters:  10/07/22 59.4 kg   BMI:  Body mass  index is 22.48 kg/m.  Estimated Nutritional Needs:   Kcal:  1700-1900  Protein:  85-100g  Fluid:  >/=1.7L  Gary Mitchell, RDN, LDN Clinical Nutrition

## 2022-10-09 ENCOUNTER — Other Ambulatory Visit: Payer: Self-pay

## 2022-10-09 ENCOUNTER — Telehealth: Payer: Self-pay

## 2022-10-09 DIAGNOSIS — I480 Paroxysmal atrial fibrillation: Secondary | ICD-10-CM

## 2022-10-09 DIAGNOSIS — K8689 Other specified diseases of pancreas: Secondary | ICD-10-CM

## 2022-10-09 DIAGNOSIS — R7989 Other specified abnormal findings of blood chemistry: Secondary | ICD-10-CM

## 2022-10-09 DIAGNOSIS — K7030 Alcoholic cirrhosis of liver without ascites: Secondary | ICD-10-CM

## 2022-10-09 LAB — COMPREHENSIVE METABOLIC PANEL
ALT: 26 U/L (ref 0–44)
AST: 48 U/L — ABNORMAL HIGH (ref 15–41)
Albumin: 2.4 g/dL — ABNORMAL LOW (ref 3.5–5.0)
Alkaline Phosphatase: 150 U/L — ABNORMAL HIGH (ref 38–126)
Anion gap: 13 (ref 5–15)
BUN: 9 mg/dL (ref 6–20)
CO2: 16 mmol/L — ABNORMAL LOW (ref 22–32)
Calcium: 8.8 mg/dL — ABNORMAL LOW (ref 8.9–10.3)
Chloride: 97 mmol/L — ABNORMAL LOW (ref 98–111)
Creatinine, Ser: 0.75 mg/dL (ref 0.61–1.24)
GFR, Estimated: >60 mL/min (ref >60)
Glucose, Bld: 309 mg/dL — ABNORMAL HIGH (ref 70–99)
Potassium: 4.3 mmol/L (ref 3.5–5.1)
Sodium: 126 mmol/L — ABNORMAL LOW (ref 135–145)
Total Bilirubin: 15.1 mg/dL — ABNORMAL HIGH (ref 0.3–1.2)
Total Protein: 7.2 g/dL (ref 6.5–8.1)

## 2022-10-09 LAB — CBC WITH DIFFERENTIAL/PLATELET
Abs Immature Granulocytes: 0.08 10*3/uL — ABNORMAL HIGH (ref 0.00–0.07)
Basophils Absolute: 0 10*3/uL (ref 0.0–0.1)
Basophils Relative: 0 %
Eosinophils Absolute: 0 10*3/uL (ref 0.0–0.5)
Eosinophils Relative: 0 %
HCT: 29.3 % — ABNORMAL LOW (ref 39.0–52.0)
Hemoglobin: 10 g/dL — ABNORMAL LOW (ref 13.0–17.0)
Immature Granulocytes: 1 %
Lymphocytes Relative: 11 %
Lymphs Abs: 1.2 10*3/uL (ref 0.7–4.0)
MCH: 29.2 pg (ref 26.0–34.0)
MCHC: 34.1 g/dL (ref 30.0–36.0)
MCV: 85.4 fL (ref 80.0–100.0)
Monocytes Absolute: 0.8 10*3/uL (ref 0.1–1.0)
Monocytes Relative: 7 %
Neutro Abs: 9.3 10*3/uL — ABNORMAL HIGH (ref 1.7–7.7)
Neutrophils Relative %: 81 %
Platelets: 82 10*3/uL — ABNORMAL LOW (ref 150–400)
RBC: 3.43 MIL/uL — ABNORMAL LOW (ref 4.22–5.81)
RDW: 24.8 % — ABNORMAL HIGH (ref 11.5–15.5)
WBC: 11.5 10*3/uL — ABNORMAL HIGH (ref 4.0–10.5)
nRBC: 0 % (ref 0.0–0.2)

## 2022-10-09 LAB — GLUCOSE, CAPILLARY
Glucose-Capillary: 305 mg/dL — ABNORMAL HIGH (ref 70–99)
Glucose-Capillary: 270 mg/dL — ABNORMAL HIGH (ref 70–99)
Glucose-Capillary: 142 mg/dL — ABNORMAL HIGH (ref 70–99)
Glucose-Capillary: 368 mg/dL — ABNORMAL HIGH (ref 70–99)

## 2022-10-09 MED ORDER — RIFAXIMIN 550 MG PO TABS
550.0000 mg | ORAL_TABLET | Freq: Two times a day (BID) | ORAL | 0 refills | Status: DC
  Filled 2022-10-09: qty 60, 30d supply, fill #0

## 2022-10-09 MED ORDER — ENSURE ENLIVE PO LIQD
237.0000 mL | Freq: Two times a day (BID) | ORAL | 12 refills | Status: DC
  Filled 2022-10-09 – 2022-11-26 (×2): qty 237, 1d supply, fill #0

## 2022-10-09 MED ORDER — PREDNISONE 20 MG PO TABS
40.0000 mg | ORAL_TABLET | Freq: Every day | ORAL | 0 refills | Status: DC
  Filled 2022-10-09: qty 56, 7d supply, fill #0
  Filled 2022-10-10: qty 14, 7d supply, fill #0

## 2022-10-09 NOTE — Telephone Encounter (Signed)
10/30/22 130 pm appt with Estill Bamberg scheduled  Lab order entered for 4/8.   Left message on machine to call back

## 2022-10-09 NOTE — Plan of Care (Signed)
AVS education done, waiting on his mom to come pick him up around 1700. For d/c. IV out/ tele off. Problem: Education: Goal: Knowledge of General Education information will improve Description: Including pain rating scale, medication(s)/side effects and non-pharmacologic comfort measures Outcome: Adequate for Discharge   Problem: Health Behavior/Discharge Planning: Goal: Ability to manage health-related needs will improve Outcome: Adequate for Discharge   Problem: Clinical Measurements: Goal: Ability to maintain clinical measurements within normal limits will improve Outcome: Adequate for Discharge Goal: Will remain free from infection Outcome: Adequate for Discharge Goal: Diagnostic test results will improve Outcome: Adequate for Discharge Goal: Respiratory complications will improve Outcome: Adequate for Discharge Goal: Cardiovascular complication will be avoided Outcome: Adequate for Discharge   Problem: Pain Managment: Goal: General experience of comfort will improve Outcome: Adequate for Discharge   Problem: Elimination: Goal: Will not experience complications related to bowel motility Outcome: Adequate for Discharge Goal: Will not experience complications related to urinary retention Outcome: Adequate for Discharge   Problem: Safety: Goal: Ability to remain free from injury will improve Outcome: Adequate for Discharge   Problem: Nutritional: Goal: Maintenance of adequate nutrition will improve Outcome: Adequate for Discharge Goal: Progress toward achieving an optimal weight will improve Outcome: Adequate for Discharge   Problem: Metabolic: Goal: Ability to maintain appropriate glucose levels will improve Outcome: Adequate for Discharge   Problem: Health Behavior/Discharge Planning: Goal: Ability to identify and utilize available resources and services will improve Outcome: Adequate for Discharge Goal: Ability to manage health-related needs will improve Outcome:  Adequate for Discharge   Problem: Tissue Perfusion: Goal: Adequacy of tissue perfusion will improve Outcome: Adequate for Discharge   Problem: Skin Integrity: Goal: Risk for impaired skin integrity will decrease Outcome: Adequate for Discharge

## 2022-10-09 NOTE — TOC Transition Note (Signed)
Transition of Care [facility]) - CM/SW Discharge Note   Patient Details  Name: Gary Mitchell MRN: [id number] Date of Birth: [birth year]  Transition of Care [facility] CM/SW Contact:  Pollie Friar, RN Phone Number: 10/09/2022, 4:14 PM   Clinical Narrative:    Pt is discharging home with self care. No needs per TOC.   Final next level of care: Home/Self Care Barriers to Discharge: No Barriers Identified   Patient Goals and CMS Choice      Discharge Placement                         Discharge Plan and Services Additional resources added to the After Visit Summary for                                       Social Determinants of Health (SDOH) Interventions SDOH Screenings   Food Insecurity: No Food Insecurity (10/07/2022)  Housing: Low Risk  (10/07/2022)  Transportation Needs: No Transportation Needs (10/07/2022)  Utilities: Not At Risk (10/07/2022)  Depression (PHQ2-9): Medium Risk (08/05/2022)  Tobacco Use: Medium Risk (10/07/2022)     Readmission Risk Interventions     No data to display
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
## 2022-10-09 NOTE — Telephone Encounter (Signed)
-----   Message from Sharyn Creamer, MD sent at 10/09/2022  2:47 PM EDT ----- Gary Mitchell, your patient Gary Mitchell with history of alcoholic hepatitis and pancreatic cystic lesion presented with weakness, found to have elevated LFTs. He is still drinking alcohol. His pancreatic cystic lesion on CT A/P w/IV contrast appears stable. We started him on prednisolone therapy for presumed alcoholic hepatitis. I suspect that he will be a responder to the prednisolone because his T bili has already dropped significantly. We also added on rifaximin to his HE regimen. Hospitalist team plans to discharge him today.   Gary Mitchell, please arrange for recheck of his LFTs on 4/8 (not 4/7 on Day 7, because that is a Sunday) as an outpatient so that Gary Mitchell score can be calculated. Please call him and let him know where he can drop off the labs. I told the hospitalist team to provide him with 7 more days of prednisolone 40 mg QD for now, and he can be given another prescription to complete the 30 days if his LFTs look appropriate. Please arrange for outpatient follow up with Dr. Rush Landmark or APP in 2-3 weeks. Thanks.

## 2022-10-09 NOTE — Telephone Encounter (Signed)
The pt has been called and advised to come in for labs on 4/8. Appt information with [facility] has been given to the pt. I have also sent all the information to the pt via My Chart

## 2022-10-09 NOTE — Progress Notes (Addendum)
[facility] Progress Note  CC:  Alcohol associated cirrhosis, hyperbilirubinemia, anorexia, nausea and vomiting   Subjective: No nausea or vomiting.  No abdominal pain.  He passed a loose light brown stool earlier this morning.  No rectal bleeding or black stools.  No chest pain or palpitations.   Objective:  Vital signs in last 24 hours: Temp:  [98.2 F (36.8 C)-98.4 F (36.9 C)] 98.2 F (36.8 C) (04/03 0810) Pulse Rate:  [67-87] 87 (04/03 0810) Resp:  [17-20] 20 (04/03 0810) BP: (105-115)/(65-76) 105/65 (04/03 0810) SpO2:  [98 %-100 %] 100 % (04/03 0810) Last BM Date : 10/06/22 General: Alert 52 year old male in no acute distress. Heart: Regular rate and rhythm, no murmurs. Pulm: Crackles in the bases.  Abdomen: Soft, mildly protuberant. No ascites. Nontender. No palpable mass. No hepatosplenomegaly. Positive bowel sounds all 4 quadrants.  Extremities:  Without edema. Neurologic:  Alert and  oriented x 4.  No asterixis. Psych:  Alert and cooperative. Normal mood and affect.  Intake/Output from previous day: 04/02 0701 - 04/03 0700 In: 240 [P.O.:240] Out: 200 [Urine:200] Intake/Output this shift: No intake/output data recorded.  Lab Results: Recent Labs    10/06/22 1820 10/07/22 0643 10/07/22 1823 10/08/22 0754 10/09/22 0336  WBC 9.2  --   --  8.0 11.5*  HGB 10.8*   < > 10.5* 11.9* 10.0*  HCT 30.6*   < > 30.1* 33.4* 29.3*  PLT 66*  --   --  85* 82*   < > = values in this interval not displayed.   BMET Recent Labs    10/07/22 0643 10/08/22 0754 10/09/22 0336  NA 131* 126* 126*  K 3.1* 4.2 4.3  CL 98 97* 97*  CO2 19* 19* 16*  GLUCOSE 68* 201* 309*  BUN 6 5* 9  CREATININE 0.66 0.63 0.75  CALCIUM 8.5* 9.2 8.8*   LFT Recent Labs    10/08/22 1247 10/09/22 0336  PROT  --  7.2  ALBUMIN  --  2.4*  AST  --  48*  ALT  --  26  ALKPHOS  --  150*  BILITOT  --  15.1*  BILIDIR 9.9*  --    PT/INR Recent Labs    10/06/22 1820  LABPROT  18.3*  INR 1.5*   Hepatitis Panel Recent Labs    10/06/22 1820  HEPBSAG NON REACTIVE  HCVAB NON REACTIVE  HEPAIGM NON REACTIVE  HEPBIGM NON REACTIVE    CT ABDOMEN PELVIS W CONTRAST  Result Date: 10/08/2022 CLINICAL DATA:  Cirrhosis, pancreatic mass, biliary obstruction EXAM: CT ABDOMEN AND PELVIS WITH CONTRAST TECHNIQUE: Multidetector CT imaging of the abdomen and pelvis was performed using the standard protocol following bolus administration of intravenous contrast. RADIATION DOSE REDUCTION: This exam was performed according to the departmental dose-optimization program which includes automated exposure control, adjustment of the mA and/or kV according to patient size and/or use of iterative reconstruction technique. CONTRAST:  23mL OMNIPAQUE IOHEXOL 350 MG/ML SOLN COMPARISON:  CT 10/06/2022, 07/18/2022, 05/09/2022, and 11/23/2019 and MRI 11/13/2021 and 01/01/2020 FINDINGS: Lower chest: No acute abnormality. Hepatobiliary: Cholelithiasis without pericholecystic inflammatory change noted. Subtle nodularity of the liver contour again noted in keeping with changes of underlying cirrhosis. No enhancing intrahepatic mass. No intra or extrahepatic biliary ductal dilation. Portal vein is patent. Recanalization of the umbilical vein again noted in keeping with changes of portal venous hypertension. Pancreas: The water attenuation cystic collection within the mid body of the pancreas is again identified and appears stable  in size since immediate prior examination measuring 3.7 x 4.8 cm at axial image # 25/3 but demonstrates slow progressive enlargement since remote prior MRI examination of 01/01/2020. At that time, the lesion had developed from CT examination of 11/23/2019 which demonstrated changes of acute pancreatitis within associated acute peripancreatic inflammatory fluid collection. The lesion itself is of water attenuation, demonstrates no internal septation, associated mural nodularity or enhancing  component, or associated calcification. There is suggestion that the lesion communicates with the dilated upstream pancreatic duct, best seen on axial image # 27/89 and prior MRCP examination of 11/13/2021. Together, the findings are most suggestive of an intrapancreatic pseudocyst, possibly related to disruption of the pancreatic duct and impaired upstream drainage. There is associated atrophy of the pancreatic tail again noted. While a main branch IPMN is not entirely excluded, its rapid development on remote prior examinations and slow subsequent course of enlargement favors a benign etiology. An indolent upstream malignancy resulting in obstruction of the main pancreatic duct, as noted previously can be considered, but again, is considered less likely given the normal appearance of the adjacent pancreatic parenchyma on this examination and the protracted time course for the development of these findings. Pancreatic parenchyma within the head of the pancreas is preserved. No peripancreatic inflammatory changes are identified. Spleen: Unremarkable Adrenals/Urinary Tract: The adrenal glands are unremarkable. Absent left kidney. Compensatory hypertrophy of the right kidney which is otherwise unremarkable. Bladder is unremarkable. Stomach/Bowel: Endoscopic clip noted within the second portion of the duodenum. Stomach, small bowel, and large bowel are otherwise unremarkable. No free intraperitoneal gas or fluid. Vascular/Lymphatic: Mild atherosclerotic calcification within the abdominal aorta. Recanalization of the umbilical vein and caput medusa with multiple superficial portosystemic venous collaterals within the anterior abdominal wall again noted. The splenic vein is attenuated suggesting prior thrombosis and recanalization. No pathologic adenopathy within the abdomen and pelvis. Reproductive: Prostate is unremarkable. Other: No abdominal wall hernia Musculoskeletal: Acute to subacute fracture of the left L4  transverse process, new since prior CT examination of 07/18/2022. Osseous structures are otherwise unremarkable. IMPRESSION: 1. Acute to subacute fracture of the left L4 transverse process, new since prior CT examination of 07/18/2022. 2. 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 See discussion above. While a main duct IPMN is not completely excluded, this is considered less likely for reasons listed above. Endoscopic ultrasound and cytologic sampling may be helpful for further evaluation. 3. Cirrhosis with evidence of portal venous hypertension. Aortic Atherosclerosis (ICD10-I70.0). Electronically Signed   By: Fidela Salisbury M.D.   On: 10/08/2022 19:46    Assessment / Plan:  52 year old male with a history of alcohol associated cirrhosis with portal hypertension, esophageal varices, portal hypertensive gastropathy who was admitted to the hospital with altered mental status and electrolyte derangement likely from nausea and vomiting. Last alcohol intake was 10/05/2022. Admission DF 43.5. MELD-NA 29, suspect acute alcohol associated hepatitis with chronic cirrhosis. Bili 19.1 -> 18.7 -> 15.1. Indirect bili > direct. Alk phos 188 -> 150. AST 90 -> 62 -> 48. ALT 31 -> 26. No further N/V. CTAP with contrast 10/08/2022 showed gallstones, cirrhosis with portal venous HTN without intra/extrahepatic biliary ductal dilatation or enhancing intrahepatic liver mass. On Lactulose tid.  Ammonia 161 -> 157. No overt hepatic encephalopathy at this time. -  2 g low-sodium diet -Continue Lactulose 20 g 3 times daily titrate to no more than 3-4 loose bowel movements daily -Continue Xifaxan 550 mg p.o. twice daily -Continue Prednisolone 40 mg daily, day #2. Check [name] score day  # 7 -CIWA protocol in process -Patient counseled no alcohol -INR, CBC, CMP in am -Await further recommendations per Dr. Lorenso Courier   Type 2 diabetes. Glucose 309. Daily Prednisolone and Solu-Medrol premed prior to CT yesterday likely contributing to hyperglycemia. -Management per the hospitalist    GERD on Pantoprazole 40 twice daily   A-fib, Eliquis on hold.    Acute on chronic anemia. Hg 10.8 down from 12.3 on 08/12/2022 -> Hg 11.9 -> today Hg 10.  Iron 67.  Saturation ratios 19%.  B12 level 3673.  Folate 17.7.  Eliquis on hold.  INR 1.5. He endorses passing intermittent dark stools. EGD/EUS 06/2022 showed bleeding erosions with associated gastropathy and portal hypertension.   Pancreatic cystic lesion. Status post EUS with FNA but no malignancy. 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 Main duct IPMN is not completely excluded.   Thrombocytopenia secondary to cirrhosis/HCC. PLT 82.    Hyponatremia secondary to cirrhosis. Na+ 126.  Acute to subacute fracture of the left L4 transverse process per CTAP 4/2, new since prior CT examination of 07/18/2022. -Management per the hospitalist       Principal Problem:   Acute blood loss anemia Active Problems:   Alcoholic hepatitis   Anemia   GERD (gastroesophageal reflux disease)   Type 2 diabetes mellitus   Hyperammonemia   Paroxysmal atrial fibrillation     LOS: 2 days   Noralyn Pick  10/09/2022, 10:40AM

## 2022-10-10 ENCOUNTER — Telehealth: Payer: Self-pay

## 2022-10-10 ENCOUNTER — Other Ambulatory Visit: Payer: Self-pay

## 2022-10-10 NOTE — Telephone Encounter (Signed)
-----   Message from Irving Copas., MD sent at 10/09/2022  5:07 PM EDT ----- CD, Thanks for helping with this. Agree with your plan. Hopefully he can afford or get the Xifaxin.  Mubarak Bevens, Put the labs under my name so we can calculate the [name] score. Also go ahead and make sure we put a prescription through to see if we are going to be able to continue this medication as an outpatient with his insurance and finances. GM ----- Message ----- From: Sharyn Creamer, MD Sent: 10/09/2022   2:53 PM EDT To: Timothy Lasso, RN; Irving Copas., MD  Cordella Register, your patient Mr. Capley with history of alcoholic hepatitis and pancreatic cystic lesion presented with weakness, found to have elevated LFTs. He is still drinking alcohol. His pancreatic cystic lesion on CT A/P w/IV contrast appears stable. We started him on prednisolone therapy for presumed alcoholic hepatitis. I suspect that he will be a responder to the prednisolone because his T bili has already dropped significantly. We also added on rifaximin to his HE regimen. Hospitalist team plans to discharge him today.   Haddy Mullinax, please arrange for recheck of his LFTs on 4/8 (not 4/7 on Day 7, because that is a Sunday) as an outpatient so that [name] score can be calculated. Please call him and let him know where he can drop off the labs. I told the hospitalist team to provide him with 7 more days of prednisolone 40 mg QD for now, and he can be given another prescription to complete the 30 days if his LFTs look appropriate. Please arrange for outpatient follow up with Dr. Rush Landmark or APP in 2-3 weeks. Thanks.

## 2022-10-10 NOTE — Transitions of Care (Post Inpatient/ED Visit) (Signed)
10/10/2022  Name: Gary Mitchell MRN: [id number] DOB: 11/02/1970  Today's TOC FU Call Status: Today's TOC FU Call Status:: Successful TOC FU Call Competed TOC FU Call Complete Date: 10/10/22  Transition Care Management Follow-up Telephone Call Date of Discharge: 10/09/22 Discharge [facility]) Type of Discharge: Inpatient Admission Primary Inpatient Discharge Diagnosis:: acute blood loss anemia How have you been since you were released from the hospital?: Same Any questions or concerns?: Yes Patient Questions/Concerns:: His mother is concerned that he is depressed and feeling overwhelmed.  She said he cries alot but she does not believe that he would harm himself. I explained that we have a clinical social worker in the clinic who may be able to offer him support/resources.  I told her that I was not able to reach him and she said he has problems with his phone and there are no other numbers to reach him.  I instructed her to have him call me with any questions and/or toreview his medications.    He has no income and has been denied disability twice. He reapplied about 1 month ago. He does not have Medicaid and she is not sure if he applied. I explained that [facility] has Medicaid caseworkers in our building and I would advise him to meet with them when he comes to his appointment at [facility] next week. Patient Questions/Concerns Addressed: Notified Provider of Patient Questions/Concerns  Items Reviewed: Did you receive and understand the discharge instructions provided?: Yes (She said he has the instructions but she is not sure if he has any questions.) Medications obtained and verified?: No (She is picking up the prednisolone today because the pharmacy did not have it yesterday. The rifaximin was too expensive and she said that the pharmacy notified the doctor.  She is not sure if he has everything else.) Medications Not Reviewed Reasons:: Advised Patient to Call Provider  Office (His mother said that she buys his meds for him and he manages the med regime but she is not sure if he has everything and takes it as ordered.) Any new allergies since your discharge?: No Dietary orders reviewed?: Yes Type of Diet Ordered:: heart healthy.  She stated that he eats healthy but only eats very little. Do you have support at home?: Yes People in Home: parent(s) Name of Support/Comfort Primary Source: he lives with his mother, Gary Mitchell.  Home Care and Equipment/Supplies: Were Home Health Services Ordered?: No Any new equipment or medical supplies ordered?: No  Functional Questionnaire: Do you need assistance with bathing/showering or dressing?: No Do you need assistance with meal preparation?: No Do you need assistance with eating?: No Do you have difficulty maintaining continence: No Do you need assistance with getting out of bed/getting out of a chair/moving?: No (She said he does not use any assistive devices but becomes short of breath even when walking short distances.) Do you have difficulty managing or taking your medications?: Yes (His mother said she buys whatever medications for him that she can afford.  he has as working glucometer but said he gets very frustarted when his blood sugar is high.)  Follow up appointments reviewed: PCP Follow-up appointment confirmed?: Yes Date of PCP follow-up appointment?: 10/16/22 Follow-up Provider: [facility] Follow-up appointment confirmed?: Yes Date of Specialist follow-up appointment?: 10/22/22 Follow-Up Specialty Provider:: cardiology and  10/30/2022- GI Do you need transportation to your follow-up appointment?: No (his mother will drive him.) Do you understand care options if your condition(s) worsen?:  Yes-patient verbalized understanding    SIGNATURE Eden Lathe, RN

## 2022-10-10 NOTE — Telephone Encounter (Signed)
The prescription was sent by another provider to the pharmacy.  Labs were ordered under GM

## 2022-10-10 NOTE — Telephone Encounter (Signed)
From his discharge call:  I was not able to reach him so I spoke to his mother  She was at work and not with him.   She is concerned that he is depressed and feeling overwhelmed.  She said he cries alot but she does not believe that he would harm himself. I explained that we have a clinical social worker in the clinic who may be able to offer him support/resources.  I told her that I was not able to reach him and she said he has problems with his phone and there are no other numbers to reach him.      He has no income and has been denied disability twice. He reapplied about 1 month ago. He does not have Medicaid and she is not sure if he applied. I explained that [place] has Medicaid caseworkers in our building and I would advise him to meet with them when he comes to his appointment at [facility] next week.  She is not sure if he has all of his medications. She is picking up the prednisolone today because the pharmacy did not have it yesterday. The rifaximin was too expensive and she said that the pharmacy notified the doctor.  His mother said that she buys his meds for him and he manages the med regime but she is not sure if he takes everything as ordered.   I instructed her to have him call me with any questions and/or to review his medications.  His mother said she buys whatever medications for him that she can afford.  he has as working glucometer but said he gets very frustarted when his blood sugar is high.   Appointment with Dr Margarita Rana  - 10/16/2022.

## 2022-10-16 ENCOUNTER — Ambulatory Visit: Payer: 59 | Attending: Family Medicine | Admitting: Family Medicine

## 2022-10-16 ENCOUNTER — Encounter: Payer: Self-pay | Admitting: Family Medicine

## 2022-10-16 ENCOUNTER — Telehealth: Payer: Self-pay

## 2022-10-16 ENCOUNTER — Other Ambulatory Visit (INDEPENDENT_AMBULATORY_CARE_PROVIDER_SITE_OTHER): Payer: 59

## 2022-10-16 ENCOUNTER — Ambulatory Visit (HOSPITAL_COMMUNITY)
Admission: RE | Admit: 2022-10-16 | Discharge: 2022-10-16 | Disposition: A | Discharge status: Home / Self Care | Payer: 59 | Source: Ambulatory Visit | Attending: Gastroenterology | Admitting: Gastroenterology

## 2022-10-16 ENCOUNTER — Telehealth: Payer: Self-pay | Admitting: Licensed Clinical Social Worker

## 2022-10-16 ENCOUNTER — Other Ambulatory Visit: Payer: Self-pay

## 2022-10-16 VITALS — BP 141/84 | HR 118 | Temp 99.0°F | Ht 64.0 in | Wt 137.6 lb

## 2022-10-16 DIAGNOSIS — R7989 Other specified abnormal findings of blood chemistry: Secondary | ICD-10-CM

## 2022-10-16 DIAGNOSIS — K709 Alcoholic liver disease, unspecified: Secondary | ICD-10-CM

## 2022-10-16 DIAGNOSIS — E1159 Type 2 diabetes mellitus with other circulatory complications: Secondary | ICD-10-CM

## 2022-10-16 DIAGNOSIS — E1142 Type 2 diabetes mellitus with diabetic polyneuropathy: Secondary | ICD-10-CM

## 2022-10-16 DIAGNOSIS — K7030 Alcoholic cirrhosis of liver without ascites: Secondary | ICD-10-CM | POA: Diagnosis not present

## 2022-10-16 DIAGNOSIS — I480 Paroxysmal atrial fibrillation: Secondary | ICD-10-CM

## 2022-10-16 DIAGNOSIS — K8689 Other specified diseases of pancreas: Secondary | ICD-10-CM | POA: Diagnosis not present

## 2022-10-16 DIAGNOSIS — R112 Nausea with vomiting, unspecified: Secondary | ICD-10-CM

## 2022-10-16 DIAGNOSIS — F322 Major depressive disorder, single episode, severe without psychotic features: Secondary | ICD-10-CM

## 2022-10-16 DIAGNOSIS — E1169 Type 2 diabetes mellitus with other specified complication: Secondary | ICD-10-CM | POA: Diagnosis not present

## 2022-10-16 DIAGNOSIS — I152 Hypertension secondary to endocrine disorders: Secondary | ICD-10-CM

## 2022-10-16 LAB — HEPATIC FUNCTION PANEL
ALT: 26 U/L (ref 0–53)
AST: 56 U/L — ABNORMAL HIGH (ref 0–37)
Albumin: 3.5 g/dL (ref 3.5–5.2)
Alkaline Phosphatase: 150 U/L — ABNORMAL HIGH (ref 39–117)
Bilirubin, Direct: 6.4 mg/dL — ABNORMAL HIGH (ref 0.0–0.3)
Total Bilirubin: 11.1 mg/dL — ABNORMAL HIGH (ref 0.2–1.2)
Total Protein: 7.5 g/dL (ref 6.0–8.3)

## 2022-10-16 LAB — POCT GLYCOSYLATED HEMOGLOBIN (HGB A1C): HbA1c, POC (controlled diabetic range): 8.6 % — AB (ref 0.0–7.0)

## 2022-10-16 MED ORDER — GABAPENTIN 300 MG PO CAPS
600.0000 mg | ORAL_CAPSULE | Freq: Every day | ORAL | 1 refills | Status: DC
Start: 2022-10-16 — End: 2023-04-02
  Filled 2022-10-16 – 2022-11-13 (×2): qty 180, 90d supply, fill #0

## 2022-10-16 MED ORDER — DULOXETINE HCL 60 MG PO CPEP
60.0000 mg | ORAL_CAPSULE | Freq: Every day | ORAL | 1 refills | Status: DC
Start: 2022-10-16 — End: 2023-04-02
  Filled 2022-10-16: qty 30, 30d supply, fill #0
  Filled 2022-11-26: qty 30, 30d supply, fill #1

## 2022-10-16 MED ORDER — LACTULOSE 10 GM/15ML PO SOLN
20.0000 g | Freq: Three times a day (TID) | ORAL | 3 refills | Status: DC
Start: 2022-10-16 — End: 2023-04-02
  Filled 2022-10-16: qty 236, 3d supply, fill #0
  Filled 2022-11-26: qty 236, 3d supply, fill #1

## 2022-10-16 MED ORDER — EMPAGLIFLOZIN 25 MG PO TABS
25.0000 mg | ORAL_TABLET | Freq: Every day | ORAL | 1 refills | Status: DC
Start: 2022-10-16 — End: 2023-01-22
  Filled 2022-10-16: qty 30, 30d supply, fill #0
  Filled 2022-11-13: qty 30, 30d supply, fill #1
  Filled 2022-12-12: qty 30, 30d supply, fill #2

## 2022-10-16 MED ORDER — ONDANSETRON HCL 4 MG PO TABS
4.0000 mg | ORAL_TABLET | Freq: Three times a day (TID) | ORAL | 1 refills | Status: DC | PRN
  Filled 2022-10-16: qty 30, 10d supply, fill #0
  Filled 2022-11-26: qty 30, 10d supply, fill #1

## 2022-10-16 NOTE — Telephone Encounter (Signed)
I met with the patient and his mother when they were in the clinic today for his appointment. He explained that he was denied disability in the past but just re-applied and is waiting for a determination. He currently has no income and is staying with his mother. He was not sure why he has [employer].  I asked he he has applied for Medicaid and he said no. I explained to him that there are [facility] DSS Medicaid eligibility caseworkers on the 4th floor of our building who will assist with submitting a Medicaid application and I encouraged him to meet with them.  He was in agreement, so I escorted he and his mother to the DSS office.

## 2022-10-16 NOTE — Progress Notes (Unsigned)
Not sleeping Loss of appetite.

## 2022-10-16 NOTE — Progress Notes (Unsigned)
Subjective:  Patient ID: Gary [facility], male    DOB: [birth year]  Age: 52 y.o. MRN: [id number]  CC: Hospitalization Follow-up   HPI Gary Mitchell is a 52 y.o. year old male with a history of alcoholic liver disease, pancreatitis, necrotic cyst, history of alcoholic hepatitis in 11/2019, alcohol-related seizures, type 2 diabetes mellitus (A1c of 9.0), previously smoked (quit 2 cig/day and quit 2022), paroxysmal A-fib who presents today for a TOC visit. He was hospitalized for decompensated liver disease from 10/06/2022 through 10/09/2022. During hospitalization his CT scan revealed incidental finding of acute to subacute L4 transverse process fracture.  Interval History:  He has an upcoming appointment with on 10/22/2022 with Cardiology on 10/30/2022 with GI. Today he Complains of insomnia x2 weeks. He has a hard time falling asleep and staying asleep.He stayed awake all night.  Also endorses presence of weakness. Still consumes ETOH about a pint/week. Still has numbness in hands and feet intermittent. He is on Gabapentin for neuropathy. He just had labs ordered by [facility].  His mom had informed the case manager that the patient has been depressed, feeling overwhelmed and crying a lot.  He has been denied disability twice and has no income. 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   Seizures    Thrombocytopenia     Past Surgical History:  Procedure Laterality Date   BIOPSY  08/02/2021    Procedure: BIOPSY;  Surgeon: Rachael Fee, MD;  Location: [facility];  Service: Endoscopy;;   BIOPSY  05/07/2022   Procedure: BIOPSY;  Surgeon: Jenel Lucks, MD;  Location: [facility];  Service: Gastroenterology;;   COLONOSCOPY WITH PROPOFOL N/A 05/08/2022   Procedure: COLONOSCOPY WITH PROPOFOL;  Surgeon: Jenel Lucks, MD;  Location: [facility];  Service: Gastroenterology;  Laterality: N/A;   ENTEROSCOPY N/A 09/20/2020   Procedure: ENTEROSCOPY;  Surgeon: Tressia Danas, MD;  Location: [facility];  Service: Gastroenterology;  Laterality: N/A;   ENTEROSCOPY N/A 05/11/2022   Procedure: ENTEROSCOPY;  Surgeon: Jenel Lucks, MD;  Location: [facility];  Service: Gastroenterology;  Laterality: N/A;   ESOPHAGOGASTRODUODENOSCOPY (EGD) WITH PROPOFOL N/A 08/02/2021   Procedure: ESOPHAGOGASTRODUODENOSCOPY (EGD) WITH PROPOFOL;  Surgeon: Rachael Fee, MD;  Location: [facility];  Service: Endoscopy;  Laterality: N/A;   ESOPHAGOGASTRODUODENOSCOPY (EGD) WITH PROPOFOL N/A 01/31/2022   Procedure: ESOPHAGOGASTRODUODENOSCOPY (EGD) WITH PROPOFOL;  Surgeon: Rachael Fee, MD;  Location: [facility];  Service: Gastroenterology;  Laterality: N/A;   ESOPHAGOGASTRODUODENOSCOPY (EGD) WITH PROPOFOL N/A 05/07/2022   Procedure: ESOPHAGOGASTRODUODENOSCOPY (EGD) WITH PROPOFOL;  Surgeon: Jenel Lucks, MD;  Location: [facility];  Service: Gastroenterology;  Laterality: N/A;   ESOPHAGOGASTRODUODENOSCOPY (EGD) WITH PROPOFOL N/A 06/13/2022   Procedure: ESOPHAGOGASTRODUODENOSCOPY (EGD) WITH PROPOFOL;  Surgeon: Meridee Score Netty Starring., MD;  Location: [facility];  Service: Gastroenterology;  Laterality: N/A;   EUS N/A 01/31/2022   Procedure: UPPER ENDOSCOPIC ULTRASOUND (EUS) RADIAL;  Surgeon: Rachael Fee, MD;  Location:  [facility];  Service: Gastroenterology;  Laterality: N/A;   EUS N/A 06/13/2022   Procedure: UPPER ENDOSCOPIC ULTRASOUND (EUS) LINEAR;  Surgeon: Lemar LoftyMansouraty,  Gabriel Jr., MD;  Location: [facility];  Service: Gastroenterology;  Laterality: N/A;   FINE NEEDLE ASPIRATION N/A 08/02/2021   Procedure: FINE NEEDLE ASPIRATION (FNA) LINEAR;  Surgeon: Rachael FeeJacobs, Daniel P, MD;  Location: [facility];  Service: Endoscopy;  Laterality: N/A;   FINE NEEDLE ASPIRATION N/A 01/31/2022   Procedure: FINE NEEDLE ASPIRATION (FNA) LINEAR;  Surgeon: Rachael FeeJacobs, Daniel P, MD;  Location: [facility];  Service: Gastroenterology;  Laterality: N/A;   FINE NEEDLE ASPIRATION N/A 06/13/2022   Procedure: FINE NEEDLE ASPIRATION (FNA) LINEAR;  Surgeon: Lemar LoftyMansouraty, Gabriel Jr., MD;  Location: [facility];  Service: Gastroenterology;  Laterality: N/A;   GIVENS CAPSULE STUDY N/A 05/09/2022   Procedure: GIVENS CAPSULE STUDY;  Surgeon: Jenel Lucksunningham, Scott E, MD;  Location: [facility];  Service: Gastroenterology;  Laterality: N/A;   HEMOSTASIS CLIP PLACEMENT  05/08/2022   Procedure: HEMOSTASIS CLIP PLACEMENT;  Surgeon: Jenel Lucksunningham, Scott E, MD;  [facility];  Service: Gastroenterology;;   HEMOSTASIS CLIP PLACEMENT  05/11/2022   Procedure: HEMOSTASIS CLIP PLACEMENT;  Surgeon: Jenel Lucksunningham, Scott E, MD;  Location: [facility];  Service: Gastroenterology;;   HOT HEMOSTASIS N/A 06/13/2022   Procedure: HOT HEMOSTASIS (ARGON PLASMA COAGULATION/BICAP);  Surgeon: Lemar LoftyMansouraty, Gabriel Jr., MD;  [facility];  Service: Gastroenterology;  Laterality: N/A;   left kidney removed     POLYPECTOMY  05/08/2022   Procedure: POLYPECTOMY;  Surgeon: Jenel Lucksunningham, Scott E, MD;  [facility];  Service: Gastroenterology;;   UPPER ESOPHAGEAL ENDOSCOPIC ULTRASOUND (EUS) N/A 08/02/2021   Procedure: UPPER ESOPHAGEAL ENDOSCOPIC ULTRASOUND (EUS);  Surgeon: Rachael FeeJacobs, Daniel P, MD;  [facility];  Service: Endoscopy;  Laterality: N/A;  Radial and Linear    Family History  Problem Relation Age of Onset   Diabetes Mellitus II Mother    Colon cancer Neg Hx    Esophageal cancer Neg Hx     Social  History   Socioeconomic History   Marital status: Single    Spouse name: Not on file   Number of children: Not on file   Years of education: Not on file   Highest education level: Not on file  Occupational History   Not on file  Tobacco Use   Smoking status: Former    Packs/day: 0.10    Years: 25.00    Additional pack years: 0.00    Total pack years: 2.50    Types: Cigarettes    Quit date: 2019    Years since quitting: 5.2   Smokeless tobacco: Never   Tobacco comments:    Former smoker 08/08/22  Vaping Use   Vaping Use: Never used  Substance and Sexual Activity   Alcohol use: Yes    Alcohol/week: 10.0 standard drinks of alcohol    Types: 10 Shots of liquor per week   Drug use: Not Currently    Types: Marijuana   Sexual activity: Not Currently  Other Topics Concern   Not on file  Social History Narrative   Not on file   Social Determinants of Health   Financial Resource Strain: Not on file  Food Insecurity: No Food Insecurity (10/07/2022)   Hunger Vital Sign    Worried About Running Out of Food in the Last Year: Never true    Ran Out of Food in the Last Year: Never true  Transportation Needs: No Transportation Needs (10/07/2022)   PRAPARE - Transportation  Lack of Transportation (Medical): No    Lack of Transportation (Non-Medical): No  Physical Activity: Not on file  Stress: Not on file  Social Connections: Not on file    Allergies  Allergen Reactions   Iohexol Other (See Comments)    Unknown reaction at 66 days old Mom at bedside reported patient was given injections of iohexol in foot and resulted in "hole in foot or foot got infected"    Outpatient Medications Prior to Visit  Medication Sig Dispense Refill   atorvastatin (LIPITOR) 20 MG tablet Take 1 tablet (20 mg total) by mouth daily. 30 tablet 3   feeding supplement (ENSURE ENLIVE / ENSURE PLUS) LIQD Take 237 mLs by mouth 2 (two) times daily between meals. 237 mL 12   folic acid (FOLVITE) 1 MG tablet  Take 1 tablet (1 mg total) by mouth daily. 30 tablet 2   gabapentin (NEURONTIN) 300 MG capsule Take 1 capsule (300 mg total) by mouth at bedtime. 30 capsule 3   glipiZIDE (GLUCOTROL) 5 MG tablet Take 1 tablet (5 mg total) by mouth 2 (two) times daily before a meal. 180 tablet 1   lactulose (CHRONULAC) 10 GM/15ML solution Take 30 mLs (20 g total) by mouth 3 (three) times daily. 236 mL 5   levETIRAcetam (KEPPRA) 500 MG tablet Take 1 tablet (500 mg total) by mouth 2 (two) times daily. 60 tablet 3   metFORMIN (GLUCOPHAGE) 500 MG tablet Take 2 tablets (1,000 mg total) by mouth 2 (two) times daily with a meal. 120 tablet 3   metoprolol succinate (TOPROL-XL) 25 MG 24 hr tablet Take 1 tablet (25 mg total) by mouth daily. (Patient taking differently: Take 25 mg by mouth every evening.) 90 tablet 1   Multiple Vitamin (MULTIVITAMIN) tablet Take 1 tablet by mouth at bedtime.     naltrexone (DEPADE) 50 MG tablet Take 1 tablet (50 mg total) by mouth daily. (Patient taking differently: Take 50 mg by mouth daily as needed (Takes only when feeling funky per patient).) 30 tablet 3   pantoprazole (PROTONIX) 40 MG tablet Take 1 tablet (40 mg total) by mouth 2 (two) times daily. (Patient taking differently: Take 40 mg by mouth daily.) 30 tablet 3   potassium chloride SA (KLOR-CON M) 20 MEQ tablet Take 1 tablet (20 mEq total) by mouth 2 (two) times daily. 20 tablet 0   predniSONE (DELTASONE) 20 MG tablet Take 2 tablets (40 mg total) by mouth daily for 7 days. 14 tablet 0   rifaximin (XIFAXAN) 550 MG TABS tablet Take 1 tablet (550 mg total) by mouth 2 (two) times daily. 60 tablet 0   sucralfate (CARAFATE) 1 GM/10ML suspension Take 10 mLs (1 g total) by mouth 4 (four) times daily -  with meals and at bedtime. 420 mL 0   thiamine (VITAMIN B-1) 100 MG tablet Take 1 tablet (100 mg total) by mouth daily. 30 tablet 2   apixaban (ELIQUIS) 5 MG TABS tablet Take 1 tablet (5 mg total) by mouth 2 (two) times daily. 60 tablet 0   No  facility-administered medications prior to visit.     ROS Review of Systems *** Objective:  BP (!) 145/85   Pulse (!) 118   Temp 99 F (37.2 C) (Oral)   Ht 5\' 4"  (1.626 m)   Wt 137 lb 9.6 oz (62.4 kg)   SpO2 97%   BMI 23.62 kg/m      10/16/2022   10:12 AM 10/09/2022    3:40 PM 10/09/2022  8:10 AM  BP/Weight  Systolic BP 145 123 105  Diastolic BP 85 85 65  Wt. (Lbs) 137.6    BMI 23.62 kg/m2        Physical Exam Constitutional:      Appearance: He is well-developed.  Eyes:     General: Scleral icterus present.  Cardiovascular:     Rate and Rhythm: Tachycardia present.     Heart sounds: Normal heart sounds. No murmur heard.    Comments: hyperdynamic Pulmonary:     Effort: Pulmonary effort is normal.     Breath sounds: Normal breath sounds. No wheezing or rales.  Chest:     Chest wall: No tenderness.  Abdominal:     General: Bowel sounds are normal. There is no distension.     Palpations: Abdomen is soft. There is no mass.     Tenderness: There is no abdominal tenderness.  Musculoskeletal:        General: Normal range of motion.     Right lower leg: No edema.     Left lower leg: No edema.  Neurological:     Mental Status: He is alert and oriented to person, place, and time.  Psychiatric:        Mood and Affect: Mood normal.    ***    Latest Ref Rng & Units 10/09/2022    3:36 AM 10/08/2022    7:54 AM 10/07/2022    6:43 AM  CMP  Glucose 70 - 99 mg/dL 798  921  68   BUN 6 - 20 mg/dL 9  5  6    Creatinine 0.61 - 1.24 mg/dL 1.94  1.74  0.81   Sodium 135 - 145 mmol/L 126  126  131   Potassium 3.5 - 5.1 mmol/L 4.3  4.2  3.1   Chloride 98 - 111 mmol/L 97  97  98   CO2 22 - 32 mmol/L 16  19  19    Calcium 8.9 - 10.3 mg/dL 8.8  9.2  8.5   Total Protein 6.5 - 8.1 g/dL 7.2  8.4    Total Bilirubin 0.3 - 1.2 mg/dL 44.8  18.5    Alkaline Phos 38 - 126 U/L 150  188    AST 15 - 41 U/L 48  62    ALT 0 - 44 U/L 26  31      Lipid Panel     Component Value Date/Time    TRIG 120 07/09/2020 1240    CBC    Component Value Date/Time   WBC 11.5 (H) 10/09/2022 0336   RBC 3.43 (L) 10/09/2022 0336   HGB 10.0 (L) 10/09/2022 0336   HGB 10.6 (L) 10/03/2020 1127   HCT 29.3 (L) 10/09/2022 0336   HCT 30.0 (L) 10/03/2020 1127   PLT 82 (L) 10/09/2022 0336   PLT 301 10/03/2020 1127   MCV 85.4 10/09/2022 0336   MCV 74 (L) 10/03/2020 1127   MCH 29.2 10/09/2022 0336   MCHC 34.1 10/09/2022 0336   RDW 24.8 (H) 10/09/2022 0336   RDW 21.1 (H) 10/03/2020 1127   LYMPHSABS 1.2 10/09/2022 0336   LYMPHSABS 2.2 10/03/2020 1127   MONOABS 0.8 10/09/2022 0336   EOSABS 0.0 10/09/2022 0336   EOSABS 0.1 10/03/2020 1127   BASOSABS 0.0 10/09/2022 0336   BASOSABS 0.2 10/03/2020 1127    Lab Results  Component Value Date   HGBA1C 8.6 (A) 10/16/2022    Assessment & Plan:  1. Type 2 diabetes mellitus with other specified complication, without  long-term current use of insulin *** - POCT glycosylated hemoglobin (Hb A1C)   Health Care Maintenance: *** No orders of the defined types were placed in this encounter.   Follow-up: No follow-ups on file.       Hoy Register, MD, FAAFP. [facility] [place], [place] [phone]   10/16/2022, 10:27 AM

## 2022-10-16 NOTE — Patient Instructions (Signed)
Alcohol Misuse and Dependence Information, Adult Alcohol is a widely available drug and people choose to drink alcohol in different amounts. Alcohol misuse and dependence can have a negative effect on your life. Alcohol misuse is when you use alcohol too much or too often. You may have a hard time setting a limit on the amount you drink. Alcohol dependence is when you use alcohol consistently for a period of time, and your body changes as a result. Alcohol dependence can make it hard for you to stop drinking because you may start to feel sick or different when you do not drink alcohol. These symptoms are known as withdrawal. People who drink alcohol very often and in large amounts, may develop what is called an alcohol use disorder. How can alcohol misuse and dependence affect me? Drinking too much can lead to addiction. You may feel like you need alcohol to function normally. You may drink alcohol before work in the morning, during the day, or as soon as you get home from work in the evening. These actions can result in: Poor work performance. Job loss. Financial problems. Car crashes or criminal charges from driving after drinking alcohol. Problems in your relationships with friends and family. Losing the trust and respect of coworkers, friends, and family. Drinking heavily over a long period of time can permanently damage your body and brain, and can cause lifelong health issues, such as: Damage to your liver or pancreas. Heart problems, high blood pressure, or stroke. Certain cancers. Decreased ability to fight infections. Brain or nerve damage. Depression. Early death, also called premature death. If you are careless or you crave alcohol, it is easy to drink more than your body can handle (overdose). Alcohol overdose is a serious situation that requires hospitalization. It may lead to permanent injuries or death. What can increase my risk? Having a family history of alcohol misuse. Having  depression or other mental health conditions. Beginning to drink at an early age. Binge drinking often. Experiencing trauma, stress, and an unstable home life during childhood. Spending time with people who drink often. What actions can I take to prevent alcohol misuse and dependence? Do not drink alcohol if: Your health care provider tells you not to drink. You are pregnant, may be pregnant, or are planning to become pregnant. If you drink alcohol: Limit how much you have to: 0-1 drink a day for women who are not pregnant. 0-2 drinks a day for men. Know how much alcohol is in your drink. In the [place] one drink equals one 12 oz bottle of beer (355 mL), one 5 oz glass of wine (148 mL), or one 1 oz glass of hard liquor (44 mL). If you think you have an alcohol dependency problem, decide to stop drinking. This can be very hard to do if you are used to frequently drinking alcohol. If you begin to have withdrawal symptoms, talk with your health care provider or a person that you trust. These symptoms may include anxiety, shaky hands, headache, nausea, sweating, or not being able to sleep. Choose to drink nonalcoholic beverages in social gatherings and places where there may be alcohol. Activity Spend more time on activities that you enjoy that do not involve alcohol, like hobbies or exercise. Find healthy ways to cope with stress, such as meditation or spending time with people you care about. General information Talk to your family, coworkers, and friends about supporting you in your efforts to stop drinking. If they drink, ask them not to drink   around you. Spend more time with people who do not drink alcohol. If you think that you have an alcohol dependency problem: Tell friends or family about your concerns. Talk with your health care provider or another health professional about where to get help. Work with a therapist and a chemical dependency counselor. Consider joining a support group  for people who struggle with alcohol misuse and dependence. Where to find support  Your health care provider. SMART Recovery: smartrecovery.org Local treatment centers or chemical dependency counselors. Local AA groups in your community: aa.org Where to find more information Centers for Disease Control and Prevention: cdc.gov National Institute on Alcohol Abuse and Alcoholism: niaaa.nih.gov Alcoholics Anonymous (AA): aa.org Contact a health care provider if: You drank more or for longer than you intended on more than one occasion. You often drink to the point of vomiting or passing out. You have problems in your life due to drinking, but you continue to drink. You keep drinking even though you feel anxious, depressed, or have experienced memory loss. You have stopped doing the things you used to enjoy in order to drink. You have to drink more than you used to in order to get the effect you want. You experience anxiety, sweating, nausea, shakiness, and trouble sleeping when you try to stop drinking. Get help right away if: You have serious withdrawal symptoms, including: Confusion. Racing heart. High blood pressure. Fever. These symptoms may be an emergency. Get help right away. Call 911. Do not wait to see if the symptoms will go away. Do not drive yourself to the hospital. Also, get help right away if: You have thoughts about hurting yourself or others. Take one of these steps if you feel like you may hurt yourself or others, or have thoughts about taking your own life: Call 911. Call the National Suicide Prevention Lifeline at 1-800-273-8255 or 988. This is open 24 hours a day. Text the Crisis Text Line at 741741. Summary Alcohol misuse and dependence can have a negative effect on your life. Drinking too much or too often can lead to addiction. If you drink alcohol, limit how much you use. If you are having trouble keeping your drinking under control, find ways to change your  behavior. Hobbies, calming activities, exercise, or support groups can help. If you feel you need help with changing your drinking habits, talk with your health care provider, a good friend, or a therapist, or go to a support group. This information is not intended to replace advice given to you by your health care provider. Make sure you discuss any questions you have with your health care provider. Document Revised: 08/29/2021 Document Reviewed: 08/29/2021 Elsevier Patient Education  2023 Elsevier Inc.  

## 2022-10-17 ENCOUNTER — Other Ambulatory Visit: Payer: Self-pay

## 2022-10-17 LAB — AMMONIA: Ammonia: 199 ug/dL (ref 40–200)

## 2022-10-17 LAB — CBC WITH DIFFERENTIAL/PLATELET
Basophils Absolute: 0.1 10*3/uL (ref 0.0–0.2)
Basos: 1 %
EOS (ABSOLUTE): 0 10*3/uL (ref 0.0–0.4)
Eos: 0 %
Hematocrit: 31.3 % — ABNORMAL LOW (ref 37.5–51.0)
Hemoglobin: 10.7 g/dL — ABNORMAL LOW (ref 13.0–17.7)
Immature Grans (Abs): 0 10*3/uL (ref 0.0–0.1)
Immature Granulocytes: 0 %
Lymphocytes Absolute: 1.3 10*3/uL (ref 0.7–3.1)
Lymphs: 13 %
MCH: 29.8 pg (ref 26.6–33.0)
MCHC: 34.2 g/dL (ref 31.5–35.7)
MCV: 87 fL (ref 79–97)
Monocytes Absolute: 1.4 10*3/uL — ABNORMAL HIGH (ref 0.1–0.9)
Monocytes: 14 %
Neutrophils Absolute: 7.2 10*3/uL — ABNORMAL HIGH (ref 1.4–7.0)
Neutrophils: 72 %
Platelets: 87 10*3/uL — CL (ref 150–450)
RBC: 3.59 x10E6/uL — ABNORMAL LOW (ref 4.14–5.80)
RDW: 19.8 % — ABNORMAL HIGH (ref 11.6–15.4)
WBC: 10.1 10*3/uL (ref 3.4–10.8)

## 2022-10-17 MED ORDER — PREDNISONE 20 MG PO TABS
40.0000 mg | ORAL_TABLET | Freq: Every day | ORAL | 0 refills | Status: DC
  Filled 2022-10-17: qty 50, 25d supply, fill #0

## 2022-10-17 NOTE — Telephone Encounter (Signed)
LCSWA met with pt during a warm hand off from PCP. PCP shared her concerns about pt "Anx & Depression, ETOH abuse, needs rehab as I am unsure we can reach him once he leaves the office. His Mom took the day off to bring him here." Pt. Was able to go upstairs in [facility] to apply for medicaid. He is waiting to hear back. Pt is interested in a detox program but not at the moment. He reports that he has not had a drink since last week. Pt did state he is open to taking the medication to reduce his symptoms of anxiety and depression.

## 2022-10-18 ENCOUNTER — Other Ambulatory Visit: Payer: Self-pay

## 2022-10-21 ENCOUNTER — Other Ambulatory Visit: Payer: Self-pay

## 2022-10-22 ENCOUNTER — Ambulatory Visit: Payer: 59 | Attending: Cardiovascular Disease | Admitting: Interventional Cardiology

## 2022-10-22 ENCOUNTER — Encounter: Payer: Self-pay | Admitting: Interventional Cardiology

## 2022-10-22 VITALS — BP 134/70 | HR 90 | Ht 64.0 in | Wt 128.6 lb

## 2022-10-22 DIAGNOSIS — D6869 Other thrombophilia: Secondary | ICD-10-CM | POA: Diagnosis not present

## 2022-10-22 DIAGNOSIS — E1159 Type 2 diabetes mellitus with other circulatory complications: Secondary | ICD-10-CM

## 2022-10-22 DIAGNOSIS — I480 Paroxysmal atrial fibrillation: Secondary | ICD-10-CM | POA: Diagnosis not present

## 2022-10-22 NOTE — Patient Instructions (Signed)
Medication Instructions:  Your physician has recommended you make the following change in your medication: Stop Eliquis  *If you need a refill on your cardiac medications before your next appointment, please call your pharmacy*   Lab Work: none If you have labs (blood work) drawn today and your tests are completely normal, you will receive your results only by: MyChart Message (if you have MyChart) OR A paper copy in the mail If you have any lab test that is abnormal or we need to change your treatment, we will call you to review the results.   Testing/Procedures: none   Follow-Up: At [facility], you and your [facility] needs are our priority.  As part of our continuing mission to provide you with exceptional heart care, we have created designated Provider Care Teams.  These Care Teams include your primary Cardiologist (physician) and Advanced Practice Providers (APPs -  Physician Assistants and Nurse Practitioners) who all work together to provide you with the care you need, when you need it.  We recommend signing up for the patient portal called "MyChart".  Sign up information is provided on this After Visit Summary.  MyChart is used to connect with patients for Virtual Visits (Telemedicine).  Patients are able to view lab/test results, encounter notes, upcoming appointments, etc.  Non-urgent messages can be sent to your provider as well.   To learn more about what you can do with MyChart, go to [email or web address].    Your next appointment:   6 month(s)  Provider:   Jari Favre, PA-C, Ronie Spies, PA-C, Robin Searing, NP, Nada Boozer, NP, Jacolyn Reedy, PA-C, Eligha Bridegroom, NP, or Tereso Newcomer, PA-C         Other Instructions  You have been referred to see the pharmacist in our office to help with medication management.  Please schedule new patient appointment.  Bring all your medications to the appointment with the pharmacist

## 2022-10-22 NOTE — Addendum Note (Signed)
Addended by: Lacy Duverney R on: 10/22/2022 09:11 AM   Modules accepted: Orders

## 2022-10-22 NOTE — Progress Notes (Signed)
Cardiology Office Note   Date:  10/22/2022   ID:  [id number] Gary Mitchell, DOB 10/23/1970, MRN [id number]  PCP:  Hoy Register, MD    No chief complaint on file.  AFib  Wt Readings from Last 3 Encounters:  10/22/22 128 lb 9.6 oz (58.3 kg)  10/16/22 137 lb 9.6 oz (62.4 kg)  10/07/22 130 lb 15.3 oz (59.4 kg)       History of Present Illness: Gary Mitchell is a 52 y.o. 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  The patient was initially diagnosed with atrial fibrillation 07/29/22 after presenting to the ED following a seizure. He had been without his Keppra for several days. Seizure witnessed by his family. EMS was called and ECG showed afib with RVR. Patient spontaneously converted to SR. He was started on Eliquis for a CHADS2VASC score of 3. He saw his PCP on 08/05/22 and was started on metoprolol.    Seen in the AFib clinic in 08/2022:" patient reports that he feels well. He was unaware of his afib a the time of his diagnosis. He reports compliance with his medications, no bleeding issues on anticoagulation.  Today, he denies symptoms of palpitations, chest pain, shortness of breath, orthopnea, PND, lower extremity edema, dizziness, presyncope, syncope, snoring, daytime somnolence, bleeding, or neurologic sequela. The patient is tolerating medications without difficulties and is otherwise without complaint today. "  He states today that he "tries not to take Eliquis" and takes it " sometimes when my feet are hurting."  Denies : Chest pain. Dizziness. Leg edema. Nitroglycerin use. Orthopnea. Palpitations. Paroxysmal nocturnal dyspnea. Shortness of breath. Syncope.    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   Seizures    Thrombocytopenia     Past Surgical History:  Procedure Laterality Date   BIOPSY  08/02/2021   Procedure: BIOPSY;  Surgeon: Rachael Fee, MD;  Location: [facility];  Service: Endoscopy;;   BIOPSY  05/07/2022   Procedure: BIOPSY;  Surgeon: Jenel Lucks, MD;  Location: [facility];  Service: Gastroenterology;;   COLONOSCOPY WITH PROPOFOL N/A 05/08/2022   Procedure: COLONOSCOPY WITH PROPOFOL;  Surgeon: Jenel Lucks, MD;  Location: [facility];  Service: Gastroenterology;  Laterality: N/A;   ENTEROSCOPY N/A 09/20/2020   Procedure: ENTEROSCOPY;  Surgeon: Tressia Danas, MD;  Location: [facility];  Service: Gastroenterology;  Laterality: N/A;   ENTEROSCOPY N/A 05/11/2022   Procedure: ENTEROSCOPY;  Surgeon: Jenel Lucks, MD;  Location: [facility];  Service: Gastroenterology;  Laterality: N/A;   ESOPHAGOGASTRODUODENOSCOPY (EGD) WITH PROPOFOL N/A 08/02/2021   Procedure: ESOPHAGOGASTRODUODENOSCOPY (EGD) WITH PROPOFOL;  Surgeon: Rachael Fee, MD;  Location: [facility];  Service: Endoscopy;  Laterality: N/A;   ESOPHAGOGASTRODUODENOSCOPY (EGD) WITH PROPOFOL N/A 01/31/2022   Procedure: ESOPHAGOGASTRODUODENOSCOPY (EGD) WITH PROPOFOL;  Surgeon: Christella Hartigan,  Melton Alar, MD;  Location: [facility];  Service: Gastroenterology;  Laterality: N/A;   ESOPHAGOGASTRODUODENOSCOPY (EGD) WITH PROPOFOL N/A 05/07/2022   Procedure: ESOPHAGOGASTRODUODENOSCOPY (EGD) WITH PROPOFOL;  Surgeon: Jenel Lucks, MD;  Location: [facility];  Service: Gastroenterology;  Laterality: N/A;    ESOPHAGOGASTRODUODENOSCOPY (EGD) WITH PROPOFOL N/A 06/13/2022   Procedure: ESOPHAGOGASTRODUODENOSCOPY (EGD) WITH PROPOFOL;  Surgeon: Meridee Score Netty Starring., MD;  Location: [facility];  Service: Gastroenterology;  Laterality: N/A;   EUS N/A 01/31/2022   Procedure: UPPER ENDOSCOPIC ULTRASOUND (EUS) RADIAL;  Surgeon: Rachael Fee, MD;  Location: [facility];  Service: Gastroenterology;  Laterality: N/A;   EUS N/A 06/13/2022   Procedure: UPPER ENDOSCOPIC ULTRASOUND (EUS) LINEAR;  Surgeon: Lemar Lofty., MD;  Location: [facility];  Service: Gastroenterology;  Laterality: N/A;   FINE NEEDLE ASPIRATION N/A 08/02/2021   Procedure: FINE NEEDLE ASPIRATION (FNA) LINEAR;  Surgeon: Rachael Fee, MD;  Location: [facility];  Service: Endoscopy;  Laterality: N/A;   FINE NEEDLE ASPIRATION N/A 01/31/2022   Procedure: FINE NEEDLE ASPIRATION (FNA) LINEAR;  Surgeon: Rachael Fee, MD;  Location: [facility];  Service: Gastroenterology;  Laterality: N/A;   FINE NEEDLE ASPIRATION N/A 06/13/2022   Procedure: FINE NEEDLE ASPIRATION (FNA) LINEAR;  Surgeon: Lemar Lofty., MD;  Location: [facility];  Service: Gastroenterology;  Laterality: N/A;   GIVENS CAPSULE STUDY N/A 05/09/2022   Procedure: GIVENS CAPSULE STUDY;  Surgeon: Jenel Lucks, MD;  Location: [facility];  Service: Gastroenterology;  Laterality: N/A;   HEMOSTASIS CLIP PLACEMENT  05/08/2022   Procedure: HEMOSTASIS CLIP PLACEMENT;  Surgeon: Jenel Lucks, MD;  Location: [facility];  Service: Gastroenterology;;   HEMOSTASIS CLIP PLACEMENT  05/11/2022   Procedure: HEMOSTASIS CLIP PLACEMENT;  Surgeon: Jenel Lucks, MD;  Location: [facility];  Service: Gastroenterology;;   HOT HEMOSTASIS N/A 06/13/2022   Procedure: HOT HEMOSTASIS (ARGON PLASMA COAGULATION/BICAP);  Surgeon: Lemar Lofty., MD;  Location: [facility];  Service: Gastroenterology;  Laterality: N/A;   left kidney removed     POLYPECTOMY   05/08/2022   Procedure: POLYPECTOMY;  Surgeon: Jenel Lucks, MD;  Location: [facility];  Service: Gastroenterology;;   UPPER ESOPHAGEAL ENDOSCOPIC ULTRASOUND (EUS) N/A 08/02/2021   Procedure: UPPER ESOPHAGEAL ENDOSCOPIC ULTRASOUND (EUS);  Surgeon: Rachael Fee, MD;  Location: [facility];  Service: Endoscopy;  Laterality: N/A;  Radial and Linear     Current Outpatient Medications  Medication Sig Dispense Refill   atorvastatin (LIPITOR) 20 MG tablet Take 1 tablet (20 mg total) by mouth daily. 30 tablet 3   DULoxetine (CYMBALTA) 60 MG capsule Take 1 capsule (60 mg total) by mouth daily. 90 capsule 1   empagliflozin (JARDIANCE) 25 MG TABS tablet Take 1 tablet (25 mg total) by mouth daily before breakfast. 90 tablet 1   feeding supplement (ENSURE ENLIVE / ENSURE PLUS) LIQD Take 237 mLs by mouth 2 (two) times daily between meals. 237 mL 12   folic acid (FOLVITE) 1 MG tablet Take 1 tablet (1 mg total) by mouth daily. 30 tablet 2   gabapentin (NEURONTIN) 300 MG capsule Take 2 capsules (600 mg total) by mouth at bedtime. 180 capsule 1   glipiZIDE (GLUCOTROL) 5 MG tablet Take 1 tablet (5 mg total) by mouth 2 (two) times daily before a meal. 180 tablet 1   lactulose (CHRONULAC) 10 GM/15ML solution Take 30 mLs (20 g total) by mouth 3 (three) times daily. 236 mL 3   metFORMIN (GLUCOPHAGE) 500 MG tablet Take 2 tablets (1,000 mg  total) by mouth 2 (two) times daily with a meal. 120 tablet 3   metoprolol succinate (TOPROL-XL) 25 MG 24 hr tablet Take 1 tablet (25 mg total) by mouth daily. (Patient taking differently: Take 25 mg by mouth every evening.) 90 tablet 1   Multiple Vitamin (MULTIVITAMIN) tablet Take 1 tablet by mouth at bedtime.     naltrexone (DEPADE) 50 MG tablet Take 1 tablet (50 mg total) by mouth daily. (Patient taking differently: Take 50 mg by mouth daily as needed (Takes only when feeling funky per patient).) 30 tablet 3   ondansetron (ZOFRAN) 4 MG tablet Take 1 tablet (4 mg total)  by mouth every 8 (eight) hours as needed for nausea or vomiting. 30 tablet 1   pantoprazole (PROTONIX) 40 MG tablet Take 1 tablet (40 mg total) by mouth 2 (two) times daily. (Patient taking differently: Take 40 mg by mouth daily.) 30 tablet 3   potassium chloride SA (KLOR-CON M) 20 MEQ tablet Take 1 tablet (20 mEq total) by mouth 2 (two) times daily. 20 tablet 0   predniSONE (DELTASONE) 20 MG tablet Take 2 tablets (40 mg total) by mouth daily. 50 tablet 0   rifaximin (XIFAXAN) 550 MG TABS tablet Take 1 tablet (550 mg total) by mouth 2 (two) times daily. 60 tablet 0   sucralfate (CARAFATE) 1 GM/10ML suspension Take 10 mLs (1 g total) by mouth 4 (four) times daily -  with meals and at bedtime. 420 mL 0   thiamine (VITAMIN B-1) 100 MG tablet Take 1 tablet (100 mg total) by mouth daily. 30 tablet 2   levETIRAcetam (KEPPRA) 500 MG tablet Take 1 tablet (500 mg total) by mouth 2 (two) times daily. 60 tablet 3   No current facility-administered medications for this visit.    Allergies:   Iohexol    Social History:  The patient  reports that he quit smoking about 5 years ago. His smoking use included cigarettes. He has a 2.50 pack-year smoking history. He has never used smokeless tobacco. He reports current alcohol use of about 10.0 standard drinks of alcohol per week. He reports that he does not currently use drugs after having used the following drugs: Marijuana.   Family History:  The patient's family history includes Diabetes Mellitus II in his mother.    ROS:  Please see the history of present illness.   Otherwise, review of systems are positive for dizziness.   All other systems are reviewed and negative.    PHYSICAL EXAM: VS:  BP 134/70   Pulse 90   Ht 5\' 4"  (1.626 m)   Wt 128 lb 9.6 oz (58.3 kg)   SpO2 97%   BMI 22.07 kg/m  , BMI Body mass index is 22.07 kg/m. GEN: Well nourished, well developed, in no acute distress HEENT: normal Neck: no JVD, carotid bruits, or masses Cardiac:  RRR; no murmurs, rubs, or gallops,no edema  Respiratory:  clear to auscultation bilaterally, normal work of breathing GI: soft, nontender, nondistended, + BS MS: no deformity or atrophy Skin: warm and dry, no rash Neuro:  Strength and sensation are intact Psych: euthymic mood, full affect   EKG:   The ekg ordered today demonstrates NSR, no ST changes   Recent Labs: 05/03/2022: NT-Pro BNP <36 05/15/2022: TSH 2.538 10/06/2022: B Natriuretic Peptide 44.4 10/07/2022: Magnesium 1.1 10/09/2022: BUN 9; Creatinine, Ser 0.75; Potassium 4.3; Sodium 126 10/16/2022: ALT 26; Hemoglobin 10.7; Platelets 87   Lipid Panel    Component Value Date/Time  TRIG 120 07/09/2020 1240     Other studies Reviewed: Additional studies/ records that were reviewed today with results demonstrating: labs reviewed.  January 2024 ECG reviewed showing atrial fibrillation with rapid ventricular response.  Current EKG today shows normal sinus rhythm with a PVC.   ASSESSMENT AND PLAN:  Atrial fibrillation: Has been managed on metoprolol and Eliquis.  CHA2DS2-VASc was called 3 but I think the score may only be 1 due to diabetes.  There is certainly increased risks of bleeding with liver problems: cirrhosis, varices and low platelets.  Compliance is an issue- please see below.  Will stop Eliquis.  Hemoglobin 10.7.  Low platelets.  Also with pancreatic cyst which needs draining from time to time .I think risk of bleeding outweighs benefit.  Continue Toprol. Diabetes: Continue medical therapy.  A1c 8.6 in April 2024.  Whole food, plant-based diet.  High-fiber diet.  Avoid processed foods. Hyperlipidemia: Continue atorvastatin. Social: He has a bag of 15 or 16 meds at home at he takes some of them at home.  So he reads through the bags everyday.  He is certain of diabetes and seizure medicines.  The other medicines are little more doubtful.   Current medicines are reviewed at length with the patient today.  The patient concerns  regarding his medicines were addressed.  The following changes have been made:  No change  Labs/ tests ordered today include:   Orders Placed This Encounter  Procedures   AMB Referral to [facility] Pharm[facility]    Recommend 150 minutes/week of aerobic exercise Low fat, low carb, high fiber diet recommended  Disposition:   FU in 6 months; will try to arrange for him to bring in his bag of medicines and set with our Pharm.D. to help sort out and schedule his medicines better to improve compliance.   Signed, Lance Muss, MD  10/22/2022 8:56 AM    [facility] [address] [place], [place], [place]  [address] Phone: [phone]; Fax: [phone]

## 2022-10-28 NOTE — Progress Notes (Signed)
10/30/2022 Gary Mitchell [id number] [birth year]  Referring provider: Hoy Register, MD Primary GI doctor: Dr. Meridee Score  ASSESSMENT AND PLAN:   52 year old male with history of alcoholic cirrhosis with portal hypertension, varices, portal hypertensive gastropathy, thrombocytopenia, hepatic encephalopathy who continues to drink presents status posthospitalization for acute alcoholic hepatitis. MELD 29, DF 43, he continues on prednisone alone daily, started on the 11th, Lilly score should have been calculated the 18th Will get labs to calculate MELD/Lilly score Including CBC, c-Met, INR Long discussion about alcohol cessation and information given to the patient. Instructed continue on multivitamins  Alcohol abuse -Alcohol Abstinence counseling discussed with patient as continued use is strongly associated with worsening liver disease progression - get on MVIT - resources given, discuss with PCP  DOE (dyspnea on exertion) No overt GI bleeding Last hemoglobin 10 Lung sounds clear on exam today States he does not feel right, having some intermittent DOE, denies chest pain, leg swelling, cough or wheezing. Some concern for worsening anemia, will repeat CBC today, last EGD 06/13/22 gastropathy, erosive gastropathy with active oozing, treated with spray and duodenal hemoclip found.  Follow-up with PCP ER precautions discussed with the patient.  Pancreatic abnormality with diarrhea and weight loss Likely from chronic pancreatitis we will do trial of Creon. EUS and EGD 06/2022 FNA no malignant cells, shows atrophy pancrease Repeat CT 10/2022 with cyst, does not appear too different from previous CT Will discuss what needs to be done going forwards with Dr. Meridee Score, potentially repeat CT in another 2-3 months? Repeat CA199 next OV  Acute hepatic encephalopathy Patient has not been taking lactulose due to diarrhea, will start on Creon Will check ammonia but refilled Xifaxan  and told patient to start taking lactulose at home. Patient does not drive   Patient Care Team: Hoy Register, MD as PCP - General (Family Medicine) Corky Crafts, MD as PCP - Cardiology (Cardiology)  HISTORY OF PRESENT ILLNESS: 52 y.o. male with a past medical history of alcoholic cirrhosis with portal hypertension, varices and gastropathy with recent EGD December 23 revealing erosions, hyperammonemia, diabetes type 2, PAF on Eliquis and others listed below, presents for hospital follow up.   Patient was in the hospital from 10/06/22 to 10/09/22, was in the hospital for alcoholic cirrhosis, hyperbilirubinemia, anorexia nausea vomiting. Patient had admission DF 43, MELD 29, started on prednisone alone. 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 See discussion above. While a main duct IPMN is not completely excluded, this is considered less likely for reasons listed above. Endoscopic ultrasound and cytologic sampling may be helpful for further evaluation. Cirrhosis with evidence of portal venous hypertension. 10/22/2022 seen Dr. Harley Hallmark cardiology for atrial fibrillation. Stopped Eliquis due to high risk of GI bleeding and noncompliance, hemoglobin at that time 10.7 with low platelets.  Patient is poor historian, uncertain of his medications.  [facility]  He has a bag of medication at the house, may need pharmacy visit or PCP visit, ? Upstream or able to do pillpak?  He states he is still drinking 2 x a week, a pint or a 5th at one time, states he is under a  lot of pressure with his  mother.  Patient reports trouble with breathing. He has dyspnea with exertion. He states he has to walk to the grocery store twice a week, 3 miles both ways and has to stop to catch his breath. Denies associated CP, coughing, wheezing.  Denies swelling in legs. Has unchanged swelling in AB.  He has some slowed reflexes with talking with him, he does not drive.  "Doesn't feel himself." He states he is still on the prednisone from the hospital.  He states he has chills from being anemic, denies fever.  He has not been taking iron, has limited amount and states that he tries to take them sparingly.  He states his appetite is okay, will drink milk or shakes. Denies nausea, vomiting.  He is having daily hard BM 5-6 a day, light brown, no melena no hematochezia.  He has not been taking th lactulose, uncertain if he is on the xiphaxin.  He has occ GERD, no dysphagia. He is on protonix 40 mg.  He has lost weight, about 30 lbs since Dec.  He is on MVIT and drinks ensure.  Wt Readings from Last 8 Encounters:  10/30/22 126 lb (57.2 kg)  10/22/22 128 lb 9.6 oz (58.3 kg)  10/16/22 137 lb 9.6 oz (62.4 kg)  10/07/22 130 lb 15.3 oz (59.4 kg)  08/08/22 127 lb 12.8 oz (58 kg)  08/05/22 130 lb 6.4 oz (59.1 kg)  07/29/22 145 lb (65.8 kg)  06/13/22 154 lb (69.9 kg)     GI history: 06/13/2022 EUS/EGD: EGD with portal hypertensive gastropathy, erosive gastropathy with active oozing noted from one of the erosions distally, treated with a snare spray coagulation, duodenal Hemoclip found in sweep, congested duodenal mucosa at the area surrounding and otherwise normal-appearing major papula; EUS with a cystic lesion in the pancreatic body connected to the main pancreatic duct and leads to dilation upstream, cytology results are pending, suspicious for intraductal papillary mucinous neoplasm, FNA performed, atrophy in the entire pancreas; biopsies with no malignant cells identified 05/11/2022 enteroscopy with small  less than 5 mm esophageal varices, mild portal hypertensive gastropathy, nonbleeding gastric ulcers with adherent clot, nonbleeding duodenal ulcers with adherent clot with clips placed, ulcers in the stomach duodenum or from mucosa biopsies taken during EGD October 31 are still not a clear explanation for his presenting anemia 05/09/22 pill capsule endoscopy with red blood and clot found in the stomach, fresh blood seen at the pylorus, copious red fresh blood in the duodenum, blood throughout the small intestine, blood in the cecum 05/08/2022 colonoscopy with only fair preparation of the colon, blood in the entire colon, one 8 mm polyp in the descending colon removed, not bleeding from the small intestine, recommended pill capsule endoscopy 05/07/2022 EGD with grade 1 esophageal varices, acquired deformity in the pylorus, mild portal hypertensive gastropathy, congested duodenal mucosa 05/03/2022 office visit with Quentin Mulling: At that time continued to drink, jaundice on exam with asterixis.  Known history of alcoholic cirrhosis with portal hypertension, recommended lactulose, scheduled CT pancreatic protocol and a CA 19 9.  Found to have critical hemoglobin 5 g drop over 6 months and had subsequent EGD, colonoscopy and capsule and enteroscopy.  01/31/2022 EUS with esophageal varices and mild gastropathy as well as thickening of the pancreas but no mass 11/2021 MRI: Pancreatic cystic lesion  He  reports that he quit smoking about 5 years ago. His smoking use included cigarettes. He has a 2.50 pack-year smoking history. He has never  used smokeless tobacco. He reports current alcohol use of about 10.0 standard drinks of alcohol per week. He reports that he does not currently use drugs after having used the following drugs: Marijuana.  RELEVANT LABS AND IMAGING: CBC    Component Value Date/Time   WBC 10.1 10/16/2022 1226   WBC 11.5 (H) 10/09/2022 0336   RBC 3.59 (L) 10/16/2022 1226   RBC 3.43 (L)  10/09/2022 0336   HGB 10.7 (L) 10/16/2022 1226   HCT 31.3 (L) 10/16/2022 1226   PLT 87 (LL) 10/16/2022 1226   MCV 87 10/16/2022 1226   MCH 29.8 10/16/2022 1226   MCH 29.2 10/09/2022 0336   MCHC 34.2 10/16/2022 1226   MCHC 34.1 10/09/2022 0336   RDW 19.8 (H) 10/16/2022 1226   LYMPHSABS 1.3 10/16/2022 1226   MONOABS 0.8 10/09/2022 0336   EOSABS 0.0 10/16/2022 1226   BASOSABS 0.1 10/16/2022 1226   Recent Labs    05/15/22 1706 06/13/22 0831 07/29/22 2159 08/12/22 2128 10/06/22 1820 10/07/22 0643 10/07/22 1823 10/08/22 0754 10/09/22 0336 10/16/22 1226  HGB 10.0* 11.5* 10.6* 12.3* 10.8* 11.0* 10.5* 11.9* 10.0* 10.7*    CMP     Component Value Date/Time   NA 126 (L) 10/09/2022 0336   NA 127 (L) 08/05/2022 1605   K 4.3 10/09/2022 0336   CL 97 (L) 10/09/2022 0336   CO2 16 (L) 10/09/2022 0336   GLUCOSE 309 (H) 10/09/2022 0336   BUN 9 10/09/2022 0336   BUN 10 08/05/2022 1605   CREATININE 0.75 10/09/2022 0336   CALCIUM 8.8 (L) 10/09/2022 0336   PROT 7.5 10/16/2022 0914   PROT 7.7 08/05/2022 1605   ALBUMIN 3.5 10/16/2022 0914   ALBUMIN 4.2 08/05/2022 1605   AST 56 (H) 10/16/2022 0914   ALT 26 10/16/2022 0914   ALKPHOS 150 (H) 10/16/2022 0914   BILITOT 11.1 (H) 10/16/2022 0914   BILITOT 6.0 (H) 08/05/2022 1605   GFRNONAA >60 10/09/2022 0336   GFRAA 147 01/12/2020 1043      Latest Ref Rng & Units 10/16/2022    9:14 AM 10/09/2022    3:36 AM 10/08/2022   12:47 PM  Hepatic Function  Total Protein 6.0 - 8.3 g/dL 7.5  7.2    Albumin 3.5 - 5.2 g/dL 3.5  2.4    AST 0 - 37 U/L 56  48    ALT 0 - 53 U/L 26  26    Alk Phosphatase 39 - 117 U/L 150  150    Total Bilirubin 0.2 - 1.2 mg/dL 45.4  09.8    Bilirubin, Direct 0.0 - 0.3 mg/dL 6.4   9.9       Latest Ref Rng & Units 11/15/2020   10:00 AM 06/21/2021    9:39 AM  Hepatitis C  AFP <6.1 ng/mL 5.1  4.3     Current Medications:   Current Outpatient Medications (Endocrine & Metabolic):    empagliflozin (JARDIANCE) 25 MG  TABS tablet, Take 1 tablet (25 mg total) by mouth daily before breakfast.   glipiZIDE (GLUCOTROL) 5 MG tablet, Take 1 tablet (5 mg total) by mouth 2 (two) times daily before a meal.   metFORMIN (GLUCOPHAGE) 500 MG tablet, Take 2 tablets (1,000 mg total) by mouth 2 (two) times daily with a meal.   predniSONE (DELTASONE) 20 MG tablet, Take 2 tablets (40 mg total) by mouth daily.  Current Outpatient Medications (Cardiovascular):    atorvastatin (LIPITOR) 20 MG tablet, Take 1 tablet (20 mg total) by mouth daily.  metoprolol succinate (TOPROL-XL) 25 MG 24 hr tablet, Take 1 tablet (25 mg total) by mouth daily. (Patient taking differently: Take 25 mg by mouth every evening.)    Current Outpatient Medications (Hematological):    folic acid (FOLVITE) 1 MG tablet, Take 1 tablet (1 mg total) by mouth daily.  Current Outpatient Medications (Other):    DULoxetine (CYMBALTA) 60 MG capsule, Take 1 capsule (60 mg total) by mouth daily.   feeding supplement (ENSURE ENLIVE / ENSURE PLUS) LIQD, Take 237 mLs by mouth 2 (two) times daily between meals.   gabapentin (NEURONTIN) 300 MG capsule, Take 2 capsules (600 mg total) by mouth at bedtime.   lactulose (CHRONULAC) 10 GM/15ML solution, Take 30 mLs (20 g total) by mouth 3 (three) times daily.   lipase/protease/amylase (CREON) 36000 UNITS CPEP capsule, Take 2 capsules (72,000 Units total) by mouth 3 (three) times daily before meals. May also take 1 capsule (36,000 Units total) 3 (three) times daily between meals as needed (2 capsules daily with meals and 1 capsule daily with a snack to help prevent diarrhea and to help with weight gain).   Multiple Vitamin (MULTIVITAMIN) tablet, Take 1 tablet by mouth at bedtime.   naltrexone (DEPADE) 50 MG tablet, Take 1 tablet (50 mg total) by mouth daily. (Patient taking differently: Take 50 mg by mouth daily as needed (Takes only when feeling funky per patient).)   ondansetron (ZOFRAN) 4 MG tablet, Take 1 tablet (4 mg total)  by mouth every 8 (eight) hours as needed for nausea or vomiting.   pantoprazole (PROTONIX) 40 MG tablet, Take 1 tablet (40 mg total) by mouth 2 (two) times daily. (Patient taking differently: Take 40 mg by mouth daily.)   potassium chloride SA (KLOR-CON M) 20 MEQ tablet, Take 1 tablet (20 mEq total) by mouth 2 (two) times daily.   sucralfate (CARAFATE) 1 GM/10ML suspension, Take 10 mLs (1 g total) by mouth 4 (four) times daily -  with meals and at bedtime.   thiamine (VITAMIN B-1) 100 MG tablet, Take 1 tablet (100 mg total) by mouth daily.   levETIRAcetam (KEPPRA) 500 MG tablet, Take 1 tablet (500 mg total) by mouth 2 (two) times daily.   rifaximin (XIFAXAN) 550 MG TABS tablet, Take 1 tablet (550 mg total) by mouth 2 (two) times daily.  Medical History:  Past Medical History:  Diagnosis Date   Alcoholic hepatitis 11/2019   Anemia    Cirrhosis with alcoholism 11/2019   Coagulopathy    Attributed to liver disease/cirrhosis   Diabetes mellitus without complication    type 2   Neuromuscular disorder    neuropathy  feet legs   Pancreatic lesion 11/2019   Initially concerning for neoplasm but improved appearance on MRI 12/2019 at which time pseudocyst was most likely diagnosis.   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   Seizures    Thrombocytopenia    Allergies:  Allergies  Allergen Reactions   Iohexol Other (See Comments)    Unknown reaction at 59 days old Mom at bedside reported patient was given injections of iohexol in foot and resulted in "hole in foot or foot got infected"     Surgical History:  He  has a past surgical history that includes left kidney removed; enteroscopy (N/A, 09/20/2020); Upper esophageal endoscopic ultrasound (eus) (N/A, 08/02/2021); Esophagogastroduodenoscopy (egd) with propofol (N/A, 08/02/2021); biopsy (08/02/2021); Fine needle aspiration (N/A, 08/02/2021); EUS (N/A,  01/31/2022);  Esophagogastroduodenoscopy (egd) with propofol (N/A, 01/31/2022); Fine needle aspiration (N/A, 01/31/2022); Esophagogastroduodenoscopy (egd) with propofol (N/A, 05/07/2022); biopsy (05/07/2022); Colonoscopy with propofol (N/A, 05/08/2022); polypectomy (05/08/2022); Hemostasis clip placement (05/08/2022); Givens capsule study (N/A, 05/09/2022); enteroscopy (N/A, 05/11/2022); Hemostasis clip placement (05/11/2022); EUS (N/A, 06/13/2022); Esophagogastroduodenoscopy (egd) with propofol (N/A, 06/13/2022); Fine needle aspiration (N/A, 06/13/2022); and Hot hemostasis (N/A, 06/13/2022). Family History:  His family history includes Diabetes Mellitus II in his mother.  REVIEW OF SYSTEMS  : All other systems reviewed and negative except where noted in the History of Present Illness.  PHYSICAL EXAM: BP 90/70   Pulse 81   Ht 5\' 4"  (1.626 m)   Wt 126 lb (57.2 kg)   BMI 21.63 kg/m  General :  Alert, chronically ill/thin male in no acute distress Head:  Normocephalic and atraumatic. Eyes :  scleral icterus,conjunctive pale  Heart:  regular rate and rhythm, muffled heart sounds Pulm: CTAB, no rales/wheezing Abdomen:   Soft, Obese AB, skin exam to areas of erythematous nonhealing ulcers, no warmth or discharge on abdomen, Normal bowel sounds.  no  tenderness . Without guarding and Without rebound, hepatomegaly noted. no  fluid wave, no  shifting dullness.  Extremities:   Without edema. Msk:  Symmetrical without gross deformities. Peripheral pulses intact.  Neurologic: Alert and  oriented x4;  grossly normal neurologically. with asterixis  Skin:   without jaundice.  Psychiatric: Cooperative, flat affect.    Doree Albee, PA-C 2:08 PM

## 2022-10-30 ENCOUNTER — Other Ambulatory Visit (INDEPENDENT_AMBULATORY_CARE_PROVIDER_SITE_OTHER): Payer: 59

## 2022-10-30 ENCOUNTER — Ambulatory Visit (INDEPENDENT_AMBULATORY_CARE_PROVIDER_SITE_OTHER): Payer: 59 | Admitting: Physician Assistant

## 2022-10-30 ENCOUNTER — Other Ambulatory Visit: Payer: Self-pay

## 2022-10-30 ENCOUNTER — Encounter: Payer: Self-pay | Admitting: Physician Assistant

## 2022-10-30 VITALS — BP 90/70 | HR 81 | Ht 64.0 in | Wt 126.0 lb

## 2022-10-30 DIAGNOSIS — K7682 Hepatic encephalopathy: Secondary | ICD-10-CM

## 2022-10-30 DIAGNOSIS — D696 Thrombocytopenia, unspecified: Secondary | ICD-10-CM

## 2022-10-30 DIAGNOSIS — F1010 Alcohol abuse, uncomplicated: Secondary | ICD-10-CM

## 2022-10-30 DIAGNOSIS — K7030 Alcoholic cirrhosis of liver without ascites: Secondary | ICD-10-CM | POA: Diagnosis not present

## 2022-10-30 DIAGNOSIS — R0609 Other forms of dyspnea: Secondary | ICD-10-CM

## 2022-10-30 DIAGNOSIS — K766 Portal hypertension: Secondary | ICD-10-CM

## 2022-10-30 DIAGNOSIS — Q453 Other congenital malformations of pancreas and pancreatic duct: Secondary | ICD-10-CM

## 2022-10-30 LAB — CBC WITH DIFFERENTIAL/PLATELET
Basophils Absolute: 0 10*3/uL (ref 0.0–0.1)
Basophils Relative: 0.6 % (ref 0.0–3.0)
Eosinophils Absolute: 0.1 10*3/uL (ref 0.0–0.7)
Eosinophils Relative: 1 % (ref 0.0–5.0)
HCT: 39 % (ref 39.0–52.0)
Hemoglobin: 12.5 g/dL — ABNORMAL LOW (ref 13.0–17.0)
Lymphocytes Relative: 21.4 % (ref 12.0–46.0)
Lymphs Abs: 1.7 10*3/uL (ref 0.7–4.0)
MCHC: 32.1 g/dL (ref 30.0–36.0)
MCV: 93 fl (ref 78.0–100.0)
Monocytes Absolute: 0.7 10*3/uL (ref 0.1–1.0)
Monocytes Relative: 8.6 % (ref 3.0–12.0)
Neutro Abs: 5.3 10*3/uL (ref 1.4–7.7)
Neutrophils Relative %: 68.4 % (ref 43.0–77.0)
Platelets: 167 10*3/uL (ref 150.0–400.0)
RBC: 4.19 Mil/uL — ABNORMAL LOW (ref 4.22–5.81)
RDW: 19.6 % — ABNORMAL HIGH (ref 11.5–15.5)
WBC: 7.8 10*3/uL (ref 4.0–10.5)

## 2022-10-30 LAB — PROTIME-INR
INR: 1.5 ratio — ABNORMAL HIGH (ref 0.8–1.0)
Prothrombin Time: 15.4 s — ABNORMAL HIGH (ref 9.6–13.1)

## 2022-10-30 LAB — HEPATIC FUNCTION PANEL
ALT: 20 U/L (ref 0–53)
AST: 45 U/L — ABNORMAL HIGH (ref 0–37)
Albumin: 3.8 g/dL (ref 3.5–5.2)
Alkaline Phosphatase: 145 U/L — ABNORMAL HIGH (ref 39–117)
Bilirubin, Direct: 4.9 mg/dL — ABNORMAL HIGH (ref 0.0–0.3)
Total Bilirubin: 8.6 mg/dL — ABNORMAL HIGH (ref 0.2–1.2)
Total Protein: 8.2 g/dL (ref 6.0–8.3)

## 2022-10-30 LAB — BASIC METABOLIC PANEL
BUN: 14 mg/dL (ref 6–23)
CO2: 24 mEq/L (ref 19–32)
Calcium: 9.5 mg/dL (ref 8.4–10.5)
Chloride: 100 mEq/L (ref 96–112)
Creatinine, Ser: 0.63 mg/dL (ref 0.40–1.50)
GFR: 111.35 mL/min (ref >60.00)
Glucose, Bld: 194 mg/dL — ABNORMAL HIGH (ref 70–99)
Potassium: 3.7 mEq/L (ref 3.5–5.1)
Sodium: 139 mEq/L (ref 135–145)

## 2022-10-30 LAB — AMMONIA: Ammonia: 53 umol/L — ABNORMAL HIGH (ref 11–35)

## 2022-10-30 MED ORDER — PANCRELIPASE (LIP-PROT-AMYL) 36000-114000 UNITS PO CPEP
ORAL_CAPSULE | ORAL | 3 refills | Status: AC
  Filled 2022-10-30: qty 300, 33d supply, fill #0
  Filled 2022-10-30: qty 300, fill #0

## 2022-10-30 MED ORDER — RIFAXIMIN 550 MG PO TABS
550.0000 mg | ORAL_TABLET | Freq: Two times a day (BID) | ORAL | 3 refills | Status: AC
  Filled 2022-10-30: qty 60, 30d supply, fill #0
  Filled 2022-11-26: qty 60, 30d supply, fill #1

## 2022-10-30 NOTE — Progress Notes (Signed)
Attending Physician's Attestation   I have reviewed the chart.   I agree with the Advanced Practitioner's note, impression, and recommendations with any updates as below. Will be interesting to see his updated labs. If he has not normalized his LFTs but still improving continue and complete his steroids for 30-days total. At that time, he needs repeat labs. If LFTs progressing/worsening, then referral to Transplant Center can be performed (though they will defer on patient most likely as a result of his recent active alcohol use).   Corliss Parish, MD [facility] Advanced [facility]oscopy Office # [phone]

## 2022-10-30 NOTE — Patient Instructions (Addendum)
Your provider has requested that you go to the basement level for lab work before leaving today. Press "B" on the elevator. The lab is located at the first door on the left as you exit the elevator.   Exocrine Pancreatic Insufficiency (EPI) EPI is a condition in which your body doesn't provide enough pancreatic enzymes to properly digest your food (which can sometimes lead to some unpleasant digestive symptoms such as bloating, AB pain and loose stools, weight loss).   For many people, EPI is also a chronic lifelong condition.   That's why its so important to know what to expect with your EPI treatment, because with the right plan in place, EPI is manageable.  HOW DO I TAKE PANCREATIC ENZYMES?  Pancreatic Enzyme Replacement therapy (Creon) is only available through prescription and cannot be substituted with over the counter alternatives.   Your doctor will personalize your dose based on your weight, diet, and symptoms.  The number of capsules you take per meal will depend on your prescribed dose.  Creon must be taken DURING every meal and snack.   Whether its a full meal or a snack, take Creon every time you eat. Remember to follow your treatment plan closely and take Creon exactly as prescribed - consistency is key!!  Dose adjustments are normal with Creon.  Your doctor will start your on the dose they deem appropriate for you.   Remember to follow up with your doctor after the first two weeks to ensure your therapy is effective and you're managing your treatment appropriately.  HEPATIC ENCEPHALOPATHY HEPATIC ENCEPHALOPATHY: Confusion caused by a build up of toxins in the blood due to the liver not being able to filter toxins. This can cause confusion mild or severe, increase falls. If you have been diagnosed with Hepatic encephalopathy, advised to not drive due to increased risk   LACTULOSE:  helps pull ammonia and other toxins from the blood into your stool when you have a bowel  movement 30-62ml up to four times a day Take a dose in the morning If by lunch time you have not had AT LEAST 2 bowel movements take another dose If by dinner you have not had AT LEAST 2 bowel movements that day take another dose If by bedtime you still have not had 2 bowel movements take another dose Goal of 3-4 bowel movements per day Avoid taking with food as this will cause more gas  IF YOU ARE VERY SLEEPY, HARD FOR YOUR FAMILY TO WAKE YOU UP, OR FALLING ASLEEP DURING CONVERSATIONS -INCREASE LACTULOSE/GO TO THE EMERGENCY ROOM  XIFAXAN (rifaximin) An antibiotic that helps limit excess bacteria in the intestine.  The bacteria can cause increased ammonia One  tablet twice daily  SUPPLEMENTS: multivitamin Zinc Branch Chain Amino acids (BCAA): 12 grams/day L-Ornithine L-Aspartate (LOLA): 6 grams three times/day ([email or web address]) L-Carnitine   TYLENOL (ACETAMINOPHEN) IS SAFE IN LIVER DISEASE: You can take tylenol (acetaminophen) up to 2,000 mg/day. This would be four extra strength ( ) tablets over 24 hours OR six regular strength (325 mg) tablets over 24 hours. Please be sure to read the ingredients of over the counter medications and prescription pain medications as many contain acetaminophen.   NO NSAIDS (ibuprofen, advil, naproxen, aleve, motrin...)  DIET/NUTRITION FOR CIRRHOSIS NO ALCOHOL YOUR GOALS Evening snack - high protein Supplements between meals to help meet calorie and protein goal: Boost Ensure Premier Protein Shakes Protein Greek yogurt Fish, chicken (NO RAW OR UNDERCOOKED FISH/SHELLFISH) Avoid pork and red meat Plant based  protein (non-soy)/Vegan: Lentils, Chickpeas, Peanuts (non salted), almonds (non salted), quinoa, chia seeds  Plant based protein supplements (not soy)  Avoid/limit animal based protein supplements: whey, casein 4.  Low sodium (2,000 mg/day) A. Avoid: table salt, canned foods, deli meats, sausages, hot dogs, anything  with a long shelf life B. Read nutrition labels and be aware of serving size. Don't go by percent of daily value.   Behavioral Health Resources in the [facility]   Intensive Outpatient Programs:  [facility]  [address]  [place], [place]  [phone]  Both a day and evening program  [facility] Outpatient  [address]  [place], [place] [address]  [phone]  [facility]  [address]  [place]  [phone]    Residential Treatment Programs:  [facility]  [address]  [place]  [phone]  [facility]  [address] [place] [address]  [place], [place] [address]  [phone]  [facility]  [address] [place], [place] [address]  [phone]  Admissions: 8am-3pm M-F  [facility]  [address]  [place], [place] [address]  [phone]  [facility]  [address] [place]  [place], [place]  [phone]  [facility]  [address]  [place], [place]  [phone]  [facility] - Intensive Outpatient  [address]  [place], [place]  [phone]  [facility]  [address]  [place], [place]  [phone] or [phone]  [facility] ([facility]), Medicaid  [address]  [place], [place]  [phone]  [facility] [address]  [address]  [place]  [phone]   [facility]  ACCESS LINE: [phone] or [phone]  [address]  [place], [place] [address]  [email or web address]   Substance Abuse Resources:  [facility] [phone]  [facility] [phone]  [facility] [phone]  Floydene Flock [phone]  Residential & Outpatient Substance Abuse Program [phone]  Psychological Services:  [facility] [phone]  [facility]  [phone]  [place], [place]. [address] [place], ACCESS LINE: [phone] or [phone], [email or web address]  Mobile Crisis Teams:  [facility]  Mobile Crisis Care Unit  [phone]  Assertive  Psychotherapeutic Services  [address] [place]  [phone]   Self-Help/Support Groups:  [facility] [employer] of support groups  [phone] (call for more info)  Narcotics Anonymous (NA)  [facility]  [address]  [place] - 2 meetings at this location

## 2022-10-31 ENCOUNTER — Other Ambulatory Visit (HOSPITAL_COMMUNITY): Payer: Self-pay

## 2022-10-31 ENCOUNTER — Telehealth: Payer: Self-pay | Admitting: Pharmacy Technician

## 2022-10-31 ENCOUNTER — Other Ambulatory Visit: Payer: Self-pay

## 2022-10-31 NOTE — Telephone Encounter (Signed)
Patient Advocate Encounter  Received notification from [facility] that prior authorization for XIFAXAN  is required.   PA submitted on 4.25.24 Key [id number] Status is pending

## 2022-11-01 ENCOUNTER — Other Ambulatory Visit (HOSPITAL_BASED_OUTPATIENT_CLINIC_OR_DEPARTMENT_OTHER): Payer: Self-pay

## 2022-11-01 ENCOUNTER — Other Ambulatory Visit: Payer: Self-pay

## 2022-11-04 ENCOUNTER — Ambulatory Visit: Payer: 59 | Admitting: Family Medicine

## 2022-11-04 ENCOUNTER — Other Ambulatory Visit: Payer: Self-pay

## 2022-11-04 NOTE — Telephone Encounter (Signed)
Patient Advocate Encounter  Prior Authorization for Xifaxan 550MG  tablets has been approved through [facility].  KeyParis Mitchell    Effective: 10-31-2022 to 10-31-2023

## 2022-11-05 ENCOUNTER — Other Ambulatory Visit: Payer: Self-pay

## 2022-11-05 ENCOUNTER — Telehealth: Payer: Self-pay

## 2022-11-05 NOTE — Telephone Encounter (Signed)
Pt made aware: Pt verbalized understanding with all questions answered.   

## 2022-11-05 NOTE — Telephone Encounter (Signed)
Can we send him somewhere in network.

## 2022-11-05 NOTE — Telephone Encounter (Signed)
Copied from CRM [id number]. Topic: General - Other >> Nov 05, 2022  9:56 AM Carrielelia G wrote:    Reason for CRM: Toniann Fail with [facility] Patient is not in network

## 2022-11-06 DIAGNOSIS — Z419 Encounter for procedure for purposes other than remedying health state, unspecified: Secondary | ICD-10-CM | POA: Diagnosis not present

## 2022-11-07 NOTE — Telephone Encounter (Signed)
NOTED

## 2022-11-13 ENCOUNTER — Ambulatory Visit: Payer: 59 | Attending: Internal Medicine | Admitting: Pharmacist

## 2022-11-13 ENCOUNTER — Other Ambulatory Visit: Payer: Self-pay

## 2022-11-13 DIAGNOSIS — Z7985 Long-term (current) use of injectable non-insulin antidiabetic drugs: Secondary | ICD-10-CM

## 2022-11-13 DIAGNOSIS — Z7189 Other specified counseling: Secondary | ICD-10-CM | POA: Insufficient documentation

## 2022-11-13 DIAGNOSIS — E1165 Type 2 diabetes mellitus with hyperglycemia: Secondary | ICD-10-CM | POA: Diagnosis not present

## 2022-11-13 MED ORDER — ATORVASTATIN CALCIUM 20 MG PO TABS
20.0000 mg | ORAL_TABLET | Freq: Every day | ORAL | 3 refills | Status: DC
Start: 2022-11-13 — End: 2023-04-02
  Filled 2022-11-13: qty 90, 90d supply, fill #0

## 2022-11-13 NOTE — Patient Instructions (Addendum)
Have the pharmacy refill your atorvastatin, gabapentin, pantoprazole, and Jardiance  You can take your last 2 potassium pills then you can stop

## 2022-11-13 NOTE — Progress Notes (Signed)
Patient ID: Gary Mitchell                 DOB: 1971-04-13                    MRN: [id number]     HPI: Gary Mitchell is a 52 y.o. male patient referred to PharmD by Dr Eldridge Dace for med rec/compliance visit. PMH is significant for hepatitis, cirrhosis, alcohol abuse, seizure disorder, CVA, pancreatic cyst, DM, and afib. Concerns for med compliance noted at last visit with MD on 10/22/22. Pt presented to the ED 07/29/22 following a seizure, had been without his Keppra for several days. At April visit, reported "trying not to take his Eliquis but taking it sometimes when his feet hurt." Eliquis was stopped as risk of bleeding outweighed benefit - CHADS2VASc score only 1 and at higher risk of bleeding complications given his cirrhosis, varices, low platelets, and compliance issues. Reported having a bag of 15-16 meds at home and taking some of them. Presents today for complete med rec.  Pt brings in bag with all of his medications today. Started using a pill box after his last visit which has been helpful, has trouble reading his medications at night. Does not have atorvastatin, taking glipizide once daily instead of twice daily, and running low on a few meds including gabapentin, Jardiance, and pantoprazole.  Family History: Diabetes Mellitus II in his mother.   Social History: The patient  reports that he quit smoking about 5 years ago. His smoking use included cigarettes. He has a 2.50 pack-year smoking history. He has never used smokeless tobacco. He reports current alcohol use of about 10.0 standard drinks of alcohol per week. He reports that he does not currently use drugs after having used the following drugs: Marijuana.   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 30 tablet 3   DULoxetine (CYMBALTA) 60 MG capsule Take 1 capsule (60 mg total) by mouth daily. 90 capsule 1   empagliflozin (JARDIANCE) 25 MG TABS tablet Take 1 tablet (25 mg total) by mouth daily before breakfast. 90 tablet 1   feeding supplement (ENSURE ENLIVE / ENSURE PLUS) LIQD Take 237 mLs by mouth 2 (two) times daily between meals. 237 mL 12   folic acid (FOLVITE) 1 MG tablet Take 1 tablet (1 mg total) by mouth daily. 30 tablet 2   gabapentin (NEURONTIN) 300 MG capsule Take 2 capsules (600 mg total) by mouth at bedtime. 180 capsule 1   glipiZIDE (GLUCOTROL) 5 MG tablet Take 1 tablet (5 mg total) by mouth 2 (two) times daily before a meal. 180 tablet 1   lactulose (CHRONULAC) 10 GM/15ML  solution Take 30 mLs (20 g total) by mouth 3 (three) times daily. 236 mL 3   levETIRAcetam (KEPPRA) 500 MG tablet Take 1 tablet (500 mg total) by mouth 2 (two) times daily. 60 tablet 3   lipase/protease/amylase (CREON) 36000 UNITS CPEP capsule Take 2 capsules (72,000 Units total) by mouth 3 (three) times daily before meals. May also take 1 capsule (36,000 Units total) 3 (three) times daily between meals as needed (2 capsules daily with meals and 1 capsule daily with a snack to help prevent diarrhea and to help with weight gain). 300 capsule 3   metFORMIN (GLUCOPHAGE) 500 MG tablet Take 2 tablets (1,000 mg total) by mouth 2 (two) times daily with a meal. 120 tablet 3   metoprolol succinate (TOPROL-XL) 25 MG 24  hr tablet Take 1 tablet (25 mg total) by mouth daily. (Patient taking differently: Take 25 mg by mouth every evening.) 90 tablet 1   Multiple Vitamin (MULTIVITAMIN) tablet Take 1 tablet by mouth at bedtime.     naltrexone (DEPADE) 50 MG tablet Take 1 tablet (50 mg total) by mouth daily. (Patient taking differently: Take 50 mg by mouth daily as needed (Takes only when feeling funky per patient).) 30 tablet 3   ondansetron (ZOFRAN) 4 MG tablet Take 1 tablet (4 mg total) by mouth every 8 (eight) hours as needed for nausea or vomiting. 30 tablet 1   pantoprazole (PROTONIX) 40 MG tablet Take 1 tablet (40 mg total) by mouth 2 (two) times daily. (Patient taking differently: Take 40 mg by mouth daily.) 30 tablet 3   potassium chloride SA (KLOR-CON M) 20 MEQ tablet Take 1 tablet (20 mEq total) by mouth 2 (two) times daily. 20 tablet 0   predniSONE (DELTASONE) 20 MG tablet Take 2 tablets (40 mg total) by mouth daily. 50 tablet 0   rifaximin (XIFAXAN) 550 MG TABS tablet Take 1 tablet (550 mg total) by mouth 2 (two) times daily. 60 tablet 3   sucralfate (CARAFATE) 1 GM/10ML suspension Take 10 mLs (1 g total) by mouth 4 (four) times daily -  with meals and at bedtime. 420 mL 0   thiamine (VITAMIN B-1) 100 MG tablet Take 1 tablet (100 mg total) by mouth daily. 30 tablet 2   No current facility-administered medications on file prior to visit.    Allergies  Allergen Reactions   Iohexol Other (See Comments)    Unknown reaction at 63 days old Mom at bedside reported patient was given injections of iohexol in foot and resulted in "hole in foot or foot got infected"    Assessment/Plan:  1. Med compliance - Started using a pill box after his last visit which has been helpful, has trouble reading his medications at night. Does not have atorvastatin at all which I have refilled. Had been taking glipizide once instead of twice daily, he is aware this should be taken twice daily. Has refills on meds he's running low on  including Jardiance, pantoprazole, and gabapentin. He is aware to pick these up from the pharmacy along with his atorvastatin.  Gary Mitchell, PharmD, BCACP, CPP [facility] [address], [place], [place] [address] Phone: [phone]; Fax: [phone] 11/13/2022 9:57 AM

## 2022-11-27 ENCOUNTER — Other Ambulatory Visit: Payer: Self-pay

## 2022-12-05 ENCOUNTER — Telehealth: Payer: Self-pay

## 2022-12-05 ENCOUNTER — Other Ambulatory Visit (INDEPENDENT_AMBULATORY_CARE_PROVIDER_SITE_OTHER): Payer: 59

## 2022-12-05 DIAGNOSIS — K7030 Alcoholic cirrhosis of liver without ascites: Secondary | ICD-10-CM

## 2022-12-05 DIAGNOSIS — D696 Thrombocytopenia, unspecified: Secondary | ICD-10-CM | POA: Diagnosis not present

## 2022-12-05 DIAGNOSIS — K766 Portal hypertension: Secondary | ICD-10-CM

## 2022-12-05 DIAGNOSIS — E1169 Type 2 diabetes mellitus with other specified complication: Secondary | ICD-10-CM

## 2022-12-05 LAB — BASIC METABOLIC PANEL
BUN: 26 mg/dL — ABNORMAL HIGH (ref 6–23)
CO2: 21 mEq/L (ref 19–32)
Calcium: 11.1 mg/dL — ABNORMAL HIGH (ref 8.4–10.5)
Chloride: 89 mEq/L — ABNORMAL LOW (ref 96–112)
Creatinine, Ser: 0.8 mg/dL (ref 0.40–1.50)
GFR: 103.53 mL/min (ref >60.00)
Glucose, Bld: 524 mg/dL (ref 70–99)
Potassium: 5 mEq/L (ref 3.5–5.1)
Sodium: 125 mEq/L — ABNORMAL LOW (ref 135–145)

## 2022-12-05 LAB — PROTIME-INR
INR: 1.8 ratio — ABNORMAL HIGH (ref 0.8–1.0)
Prothrombin Time: 18.2 s — ABNORMAL HIGH (ref 9.6–13.1)

## 2022-12-05 LAB — CBC WITH DIFFERENTIAL/PLATELET
Absolute Monocytes: 894 cells/uL (ref 200–950)
Basophils Absolute: 15 cells/uL (ref 0–200)
Basophils Relative: 0.1 %
Eosinophils Absolute: 0 cells/uL — ABNORMAL LOW (ref 15–500)
Eosinophils Relative: 0 %
HCT: 37.8 % — ABNORMAL LOW (ref 38.5–50.0)
Hemoglobin: 12.1 g/dL — ABNORMAL LOW (ref 13.2–17.1)
Lymphs Abs: 1386 cells/uL (ref 850–3900)
MCH: 27.5 pg (ref 27.0–33.0)
MCHC: 32 g/dL (ref 32.0–36.0)
MCV: 85.9 fL (ref 80.0–100.0)
Monocytes Relative: 6 %
Neutro Abs: 12605 cells/uL — ABNORMAL HIGH (ref 1500–7800)
Neutrophils Relative %: 84.6 %
Platelets: 168 10*3/uL (ref 140–400)
RBC: 4.4 10*6/uL (ref 4.20–5.80)
RDW: 18.1 % — ABNORMAL HIGH (ref 11.0–15.0)
Total Lymphocyte: 9.3 %
WBC: 14.9 10*3/uL — ABNORMAL HIGH (ref 3.8–10.8)

## 2022-12-05 LAB — HEPATIC FUNCTION PANEL
ALT: 35 U/L (ref 0–53)
AST: 43 U/L — ABNORMAL HIGH (ref 0–37)
Albumin: 3.7 g/dL (ref 3.5–5.2)
Alkaline Phosphatase: 238 U/L — ABNORMAL HIGH (ref 39–117)
Bilirubin, Direct: 5.8 mg/dL — ABNORMAL HIGH (ref 0.0–0.3)
Total Bilirubin: 10 mg/dL — ABNORMAL HIGH (ref 0.2–1.2)
Total Protein: 8.5 g/dL — ABNORMAL HIGH (ref 6.0–8.3)

## 2022-12-05 NOTE — Telephone Encounter (Signed)
Received call report for critical lab value:  Glucose 524 drawn at 10:57am.

## 2022-12-05 NOTE — Telephone Encounter (Signed)
Left message for pt to call back on both phone numbers. Left detailed message for pt to call back, try to call PCP or endocrinology and if he cannot reach them he needs to proceed to the ER. Requested he call back and let us know he got the message.

## 2022-12-05 NOTE — Telephone Encounter (Signed)
Was able to reach patient on the phone. Patient has still been taking prednisone for his back but appears it was prescribed for alcoholic hepatitis.  Has 3 more days left. Checked patient to stop prednisone at this time, continue with his glipizide and metformin.  Will send message to Dr. Alvis Lemmings his PCP and instructed patient to call his PCP for follow-up. Patient states he took 2 extra glipizide today and his last sugar check was 329.  He has had polydipsia and polyuria, and some fatigue, but denies nausea, vomiting, abdominal pain, shortness of breath or chest pain. Instructed patient if sugars do not go down or if he has any worsening symptoms go to the ER. He expressed understanding.

## 2022-12-06 ENCOUNTER — Other Ambulatory Visit: Payer: Self-pay

## 2022-12-06 MED ORDER — GLIPIZIDE 10 MG PO TABS
10.0000 mg | ORAL_TABLET | Freq: Two times a day (BID) | ORAL | 1 refills | Status: DC
Start: 2022-12-06 — End: 2023-01-22
  Filled 2022-12-06: qty 180, 90d supply, fill #0

## 2022-12-06 NOTE — Telephone Encounter (Signed)
I have increased his glipizide from 5 mg twice daily to 10 mg twice daily and a new prescription sent to his pharmacy.

## 2022-12-06 NOTE — Addendum Note (Signed)
Addended by: Hoy Register on: 12/06/2022 11:31 AM   Modules accepted: Orders

## 2022-12-07 DIAGNOSIS — Z419 Encounter for procedure for purposes other than remedying health state, unspecified: Secondary | ICD-10-CM | POA: Diagnosis not present

## 2022-12-12 ENCOUNTER — Other Ambulatory Visit: Payer: Self-pay

## 2022-12-16 ENCOUNTER — Other Ambulatory Visit: Payer: Self-pay

## 2022-12-20 ENCOUNTER — Other Ambulatory Visit: Payer: Self-pay

## 2022-12-20 ENCOUNTER — Telehealth: Payer: Self-pay | Admitting: Family Medicine

## 2022-12-20 DIAGNOSIS — E1169 Type 2 diabetes mellitus with other specified complication: Secondary | ICD-10-CM

## 2022-12-20 MED ORDER — ACCU-CHEK GUIDE VI STRP
ORAL_STRIP | 2 refills | Status: DC
Start: 2022-12-20 — End: 2023-12-30
  Filled 2022-12-20: qty 100, 100d supply, fill #0

## 2022-12-20 MED ORDER — ACCU-CHEK SOFTCLIX LANCETS MISC
2 refills | Status: DC
Start: 2022-12-20 — End: 2023-04-02
  Filled 2022-12-20: qty 100, 100d supply, fill #0

## 2022-12-20 MED ORDER — ACCU-CHEK GUIDE W/DEVICE KIT
PACK | 0 refills | Status: DC
Start: 2022-12-20 — End: 2023-12-30
  Filled 2022-12-20: qty 1, 30d supply, fill #0

## 2022-12-20 NOTE — Telephone Encounter (Signed)
Spoke with patient . Verified name & DOB  Patient voiced that he has a monitor which he received for a family member and he does not know how to operate it . Patient voiced that he needs to check he glucose daily and can not. Advised patient that I would spoke to our pharmacist and or patient provider  to see if they could assist with getting him a glucose monitor and assistance on how to operate it. Pt. Also voiced that  he was having difficulty reach someone at his insurance company to inquire about helping him with transportation to his appointment, because d/t his stroke he can not drive. Voiced that his mother has to provide transportation. She gets off late and most places are closed by then voiced that this does him not good. So he really not able to get to his appointments. Patient voices that he was approved for the medicaid expansion program and has not received an insurance card yet. Number to medicaid given to patient . Patient voiced that he will call to obtain information about transportation assistance. Routing call to pharmacist to assist with obtain monitor and education.

## 2022-12-20 NOTE — Telephone Encounter (Signed)
Call placed to patient. I called him at 5:18 PM and he was standing in line at our pharmacy downstairs. Rxn sent for Accu Chek. A pharmacist in the pharmacy will help teach him how to use it.

## 2022-12-20 NOTE — Telephone Encounter (Signed)
The patient called in stating he has 2 blood glucose meters and doesn't know how to use them. He states they are new out of the box but he cannot get them to work. Please assist patient further. He also would like to talk with someone when they call about difficulty getting a hold of his transportation service through his medicaid.

## 2022-12-23 ENCOUNTER — Ambulatory Visit: Payer: Self-pay | Admitting: *Deleted

## 2022-12-23 ENCOUNTER — Other Ambulatory Visit: Payer: Self-pay

## 2022-12-23 NOTE — Telephone Encounter (Signed)
Reason for Disposition  Patient sounds very sick or weak to the triager    Instructed his mother Para March to call 911 to do a wellness check since she can't get him to come to the phone.   She is calling her sister who has a key to the house to go check on him and call 911 if necessary.  Answer Assessment - Initial Assessment Questions 1. APPEARANCE of MOVEMENT: "What did the jerking or twitching look like?" (e.g., body area)     Mother Para March calling in.  I verified his name and DOB with her.    He is shaky.     This started on Friday.  He went to the pharmacy and got a new glucose meter on Friday.    2 weeks ago his blood sugar was over 500 on his blood work.    He has been real thirsty and peeing a lot.   He is feeling dizzy.    That night Friday he could not balance himself.   I had to help him walk and stand.    He is not making it to the bathroom in time.   He is incontinent of stool and urine.    I can't take it any more.   He can't get up by himself.   He lives with me and I can't take this anymore.   I'm at work now.    I left him at home.   I tried to call him and no one is answering the phone.   He is mad at the world and fussing about everything.    He lives with me.   That's part of the problem.   He pooping and peeing everywhere.  My house is a mess.    2. ONSET: "When did this start happening?" (e.g., hours, days, weeks, months ago)     Friday  evening  I don't know what he is doing or what he has been doing.   He has a history of alcohol.    I don't go in his room unless I have to.   He don't have a medicaid card.  They are supposed to be sending it to him.   I instructed her to call 911 and have EMS to go to her house and do a welfare check.    I have to work so I can't leave.    I think I'll call my sister to go check on him.   She has a key to my house.   She only lives a mile from my house.    She was agreeable to calling her sister to go check on him.   I let Para March know if  your sister can't get him to the door to call 911.   Jeanette's sister can let the EMS in since she has a key.   3. DURATION: "How long does the jerk, twitch, or spasm last?"     This all started Friday afternoon when he started going down hill.   Shaking and peeing on himself and pooping on himself.   Feeling dizzy. 4. FREQUENCY:  "How often does this happen?"      All weekend this has been going on.   I don't know if he has been doing alcohol or not.    5. WHEN: "When does this happen?" (e.g., while awake, while falling asleep, while sleeping)     All the time since Friday evening 6. CAUSE: "What do you think  caused the shaking and incontinence?"     I don't know per Para March his mother.  I asked her if there was a history of drug or alcohol use.   She said there was alcohol use but she doesn't know if he has been drinking or not.   "No drugs as far as I know but I don't know what all he does".    7. OTHER SYMPTOMS: "Are there any other symptoms?" (e.g., fever, headache)     See above 8. PREGNANCY: "Is there any chance you are pregnant?" "When was your last menstrual period?"     N/A  Protocols used: [facility] - Tics - [facility] [place] [facility]

## 2022-12-23 NOTE — Telephone Encounter (Signed)
Noted  

## 2022-12-23 NOTE — Telephone Encounter (Signed)
  Chief Complaint: Mother Para March called in.  She is at work and not with him.   He lives with her.   She has been trying to call him but he won't answer the phone.   I instructed her to call 911 to do a wellness check.  Her sister lives a mile from her house and has a key.   She's calling her sister to go check on him and call 911 if she can't get him to the door or finds him needing emergency help Symptoms: Friday afternoon started shaking, was dizzy, off balance.   Mother had to help him get up and to ambulate around the house.  He is incontinent of urine and stool.   Mother said the house is a mess as a result.   Son lives with his mother.  Frequency: This has been going on since Friday afternoon. Pertinent Negatives: Patient denies him using drugs but he does drink alcohol.   Disposition: [x] ED /[] Urgent Care (no appt availability in office) / [] Appointment(In office/virtual)/ []  [facility]/ [] Home Care/ [] Refused Recommended Disposition /[]  Mobile Bus/ []  Follow-up with PCP Additional Notes: Para March was agreeable to calling her sister to go check on him first since she has a key to the house and can let EMS in if she ends up calling them for a wellness check.

## 2022-12-27 ENCOUNTER — Emergency Department (HOSPITAL_COMMUNITY): Payer: 59

## 2022-12-27 ENCOUNTER — Encounter (HOSPITAL_COMMUNITY): Payer: Self-pay

## 2022-12-27 ENCOUNTER — Emergency Department (HOSPITAL_BASED_OUTPATIENT_CLINIC_OR_DEPARTMENT_OTHER): Payer: 59

## 2022-12-27 ENCOUNTER — Emergency Department (HOSPITAL_COMMUNITY)
Admission: EM | Admit: 2022-12-27 | Discharge: 2022-12-28 | Disposition: A | Discharge status: Home / Self Care | Payer: 59 | Attending: Emergency Medicine | Admitting: Emergency Medicine

## 2022-12-27 DIAGNOSIS — R2242 Localized swelling, mass and lump, left lower limb: Secondary | ICD-10-CM | POA: Diagnosis not present

## 2022-12-27 DIAGNOSIS — Z1152 Encounter for screening for COVID-19: Secondary | ICD-10-CM | POA: Diagnosis not present

## 2022-12-27 DIAGNOSIS — K862 Cyst of pancreas: Secondary | ICD-10-CM | POA: Diagnosis not present

## 2022-12-27 DIAGNOSIS — E119 Type 2 diabetes mellitus without complications: Secondary | ICD-10-CM | POA: Insufficient documentation

## 2022-12-27 DIAGNOSIS — Z7984 Long term (current) use of oral hypoglycemic drugs: Secondary | ICD-10-CM | POA: Diagnosis not present

## 2022-12-27 DIAGNOSIS — R0602 Shortness of breath: Secondary | ICD-10-CM | POA: Insufficient documentation

## 2022-12-27 DIAGNOSIS — E8770 Fluid overload, unspecified: Secondary | ICD-10-CM | POA: Insufficient documentation

## 2022-12-27 LAB — RESP PANEL BY RT-PCR (RSV, FLU A&B, COVID)  RVPGX2
Influenza A by PCR: NEGATIVE
Influenza B by PCR: NEGATIVE
Resp Syncytial Virus by PCR: NEGATIVE
SARS Coronavirus 2 by RT PCR: NEGATIVE

## 2022-12-27 LAB — CBC
HCT: 30 % — ABNORMAL LOW (ref 39.0–52.0)
Hemoglobin: 9.8 g/dL — ABNORMAL LOW (ref 13.0–17.0)
MCH: 27.5 pg (ref 26.0–34.0)
MCHC: 32.7 g/dL (ref 30.0–36.0)
MCV: 84 fL (ref 80.0–100.0)
Platelets: 101 10*3/uL — ABNORMAL LOW (ref 150–400)
RBC: 3.57 MIL/uL — ABNORMAL LOW (ref 4.22–5.81)
RDW: 19.9 % — ABNORMAL HIGH (ref 11.5–15.5)
WBC: 13 10*3/uL — ABNORMAL HIGH (ref 4.0–10.5)
nRBC: 0 % (ref 0.0–0.2)

## 2022-12-27 LAB — PROTIME-INR
INR: 1.7 — ABNORMAL HIGH (ref 0.8–1.2)
Prothrombin Time: 20.4 seconds — ABNORMAL HIGH (ref 11.4–15.2)

## 2022-12-27 LAB — COMPREHENSIVE METABOLIC PANEL
ALT: 31 U/L (ref 0–44)
AST: 79 U/L — ABNORMAL HIGH (ref 15–41)
Albumin: 2.1 g/dL — ABNORMAL LOW (ref 3.5–5.0)
Alkaline Phosphatase: 290 U/L — ABNORMAL HIGH (ref 38–126)
Anion gap: 10 (ref 5–15)
BUN: 12 mg/dL (ref 6–20)
CO2: 18 mmol/L — ABNORMAL LOW (ref 22–32)
Calcium: 7.6 mg/dL — ABNORMAL LOW (ref 8.9–10.3)
Chloride: 97 mmol/L — ABNORMAL LOW (ref 98–111)
Creatinine, Ser: 0.73 mg/dL (ref 0.61–1.24)
GFR, Estimated: >60 mL/min (ref >60)
Glucose, Bld: 350 mg/dL — ABNORMAL HIGH (ref 70–99)
Potassium: 3.2 mmol/L — ABNORMAL LOW (ref 3.5–5.1)
Sodium: 125 mmol/L — ABNORMAL LOW (ref 135–145)
Total Bilirubin: 9.4 mg/dL — ABNORMAL HIGH (ref 0.3–1.2)
Total Protein: 7.2 g/dL (ref 6.5–8.1)

## 2022-12-27 LAB — LACTIC ACID, PLASMA
Lactic Acid, Venous: 2.1 mmol/L (ref 0.5–1.9)
Lactic Acid, Venous: 1.6 mmol/L (ref 0.5–1.9)

## 2022-12-27 LAB — TROPONIN I (HIGH SENSITIVITY)
Troponin I (High Sensitivity): 11 ng/L (ref <18)
Troponin I (High Sensitivity): 10 ng/L (ref <18)

## 2022-12-27 LAB — D-DIMER, QUANTITATIVE: D-Dimer, Quant: 0.65 ug/mL-FEU — ABNORMAL HIGH (ref 0.00–0.50)

## 2022-12-27 LAB — BRAIN NATRIURETIC PEPTIDE: B Natriuretic Peptide: 216.8 pg/mL — ABNORMAL HIGH (ref 0.0–100.0)

## 2022-12-27 MED ORDER — DIPHENHYDRAMINE HCL 50 MG/ML IJ SOLN
50.0000 mg | Freq: Once | INTRAMUSCULAR | Status: AC
  Administered 2022-12-27: 50 mg via INTRAVENOUS
  Filled 2022-12-27: qty 1

## 2022-12-27 MED ORDER — METHYLPREDNISOLONE SODIUM SUCC 40 MG IJ SOLR
40.0000 mg | Freq: Once | INTRAMUSCULAR | Status: AC
  Administered 2022-12-27: 40 mg via INTRAVENOUS
  Filled 2022-12-27: qty 1

## 2022-12-27 MED ORDER — DIPHENHYDRAMINE HCL 25 MG PO CAPS: 50.0000 mg | ORAL_CAPSULE | Freq: Once | ORAL | Status: AC

## 2022-12-27 MED ORDER — FUROSEMIDE 10 MG/ML IJ SOLN
60.0000 mg | Freq: Once | INTRAMUSCULAR | Status: AC
  Administered 2022-12-27: 60 mg via INTRAVENOUS
  Filled 2022-12-27: qty 8

## 2022-12-27 MED ORDER — IOHEXOL 350 MG/ML SOLN
75.0000 mL | Freq: Once | INTRAVENOUS | Status: AC | PRN
  Administered 2022-12-27: 75 mL via INTRAVENOUS

## 2022-12-27 MED ORDER — POTASSIUM CHLORIDE 20 MEQ PO PACK
60.0000 meq | PACK | ORAL | Status: AC
  Administered 2022-12-27: 60 meq via ORAL
  Filled 2022-12-27: qty 3

## 2022-12-27 NOTE — ED Provider Notes (Signed)
[facility] AT [facility] Provider Note   CSN: [id number] Arrival date & time: 12/27/22  1627     History {Add pertinent medical, surgical, social history, OB history to HPI:1} Chief Complaint  Patient presents with   Shortness of Breath    Gary Mitchell is a 52 y.o. male.  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  Says that for the past 2 weeks has been having significant dyspnea on exertion.  Can barely make it 10 feet without having to stop.  Also says that his left lower extremity has been more swollen for the past week and a half.  Says that he was taken off of his Eliquis approximately a month ago.  No alcohol use in the past month.  No chest pain, fever, or cough.  Sleeping with more pillows than usual at night.       Home Medications Prior to Admission medications   Medication Sig Start Date End Date Taking? Authorizing Provider  Accu-Chek Softclix Lancets lancets Use to check blood sugar once daily. E11.69 12/20/22   Hoy Register, MD  atorvastatin (LIPITOR) 20 MG tablet Take 1 tablet (20 mg total) by mouth daily. 11/13/22   Corky Crafts, MD  Blood Glucose Monitoring Suppl (ACCU-CHEK GUIDE) w/Device KIT Use to check blood sugar once daily. E11.69 12/20/22   Hoy Register, MD  DULoxetine (CYMBALTA) 60 MG capsule Take 1 capsule (60 mg total) by mouth daily. 10/16/22   Hoy Register, MD  empagliflozin (JARDIANCE) 25 MG TABS tablet Take 1 tablet (25 mg total) by mouth daily before breakfast. 10/16/22   Hoy Register, MD  feeding supplement (ENSURE ENLIVE / ENSURE PLUS) LIQD Take 237 mLs by mouth 2 (two) times daily between meals. 10/10/22   Lorin Glass, MD  folic acid (FOLVITE) 1 MG tablet Take 1 tablet (1 mg total) by mouth daily. 05/13/22    Kathlen Mody, MD  gabapentin (NEURONTIN) 300 MG capsule Take 2 capsules (600 mg total) by mouth at bedtime. 10/16/22   Hoy Register, MD  glipiZIDE (GLUCOTROL) 10 MG tablet Take 1 tablet (10 mg total) by mouth 2 (two) times daily before a meal. 12/06/22   Newlin, Enobong, MD  glucose blood (ACCU-CHEK GUIDE) test strip Use to check blood sugar once daily. E11.69 12/20/22   Hoy Register, MD  ibuprofen (ADVIL) 200 MG tablet Take 200 mg by mouth as needed. Uses twice weekly    [provider]  lactulose (CHRONULAC) 10 GM/15ML solution Take 30 mLs (20 g total) by mouth 3 (three) times daily. Patient not taking: Reported on 11/13/2022 10/16/22   Hoy Register, MD  levETIRAcetam (KEPPRA) 500 MG tablet Take 1 tablet (500 mg total) by mouth 2 (two) times daily. 07/10/22 12/27/22  Hoy Register, MD  lipase/protease/amylase (CREON) 36000 UNITS CPEP capsule Take 2 capsules (72,000 Units total) by mouth 3 (three) times daily before meals. May also take 1 capsule (36,000 Units total) 3 (three) times daily between meals as needed (2 capsules daily with meals and 1 capsule daily with a snack to help prevent diarrhea and to help with weight gain). 10/30/22 01/28/23  Doree Albee, PA-C  metFORMIN (GLUCOPHAGE) 500 MG tablet Take 2 tablets (1,000 mg total) by mouth 2 (two) times daily with a meal. 08/05/22   Hoy Register, MD  metoprolol succinate (TOPROL-XL) 25 MG  24 hr tablet Take 1 tablet (25 mg total) by mouth daily. 08/05/22   Hoy Register, MD  Multiple Vitamin (MULTIVITAMIN WITH MINERALS) TABS tablet Take 1 tablet by mouth daily.    [provider]  naltrexone (DEPADE) 50 MG tablet Take 1 tablet (50 mg total) by mouth daily. Patient not taking: Reported on 11/13/2022 10/03/20   Hoy Register, MD  ondansetron (ZOFRAN) 4 MG tablet Take 1 tablet (4 mg total) by mouth every 8 (eight) hours as needed for nausea or vomiting. 10/16/22   Hoy Register, MD  pantoprazole (PROTONIX) 40 MG tablet Take  1 tablet (40 mg total) by mouth 2 (two) times daily. 05/12/22   Kathlen Mody, MD  predniSONE (DELTASONE) 20 MG tablet Take 2 tablets (40 mg total) by mouth daily. 10/17/22   Mansouraty, Netty Starring., MD  rifaximin (XIFAXAN) 550 MG TABS tablet Take 1 tablet (550 mg total) by mouth 2 (two) times daily. 10/30/22 02/27/23  Doree Albee, PA-C  thiamine (VITAMIN B-1) 100 MG tablet Take 1 tablet (100 mg total) by mouth daily. 05/13/22   Kathlen Mody, MD      Allergies    Iohexol    Review of Systems   Review of Systems  Physical Exam Updated Vital Signs BP 105/64 (BP Location: Right Arm)   Pulse 86   Temp 97.7 F (36.5 C) (Oral)   Resp 18   SpO2 100%  Physical Exam Vitals and nursing note reviewed.  Constitutional:      General: He is not in acute distress.    Appearance: He is well-developed.     Comments: Speaking in full sentences and does not appear in any acute distress  HENT:     Head: Normocephalic and atraumatic.     Right Ear: External ear normal.     Left Ear: External ear normal.     Nose: Nose normal.  Eyes:     Extraocular Movements: Extraocular movements intact.     Conjunctiva/sclera: Conjunctivae normal.     Pupils: Pupils are equal, round, and reactive to light.  Cardiovascular:     Rate and Rhythm: Normal rate. Rhythm irregular.     Heart sounds: Normal heart sounds.  Pulmonary:     Effort: Pulmonary effort is normal. No respiratory distress.     Breath sounds: Rales (Bibasilar) present.  Abdominal:     General: There is no distension.     Palpations: Abdomen is soft. There is no mass.     Tenderness: There is no abdominal tenderness. There is no guarding.  Musculoskeletal:     Cervical back: Normal range of motion and neck supple.     Right lower leg: Edema (2+) present.     Left lower leg: Edema (4+) present.  Skin:    General: Skin is warm and dry.  Neurological:     Mental Status: He is alert. Mental status is at baseline.  Psychiatric:         Mood and Affect: Mood normal.        Behavior: Behavior normal.     ED Results / Procedures / Treatments   Labs (all labs ordered are listed, but only abnormal results are displayed) Labs Reviewed - No data to display  EKG EKG Interpretation  Date/Time:  Friday December 27 2022 16:41:10 EDT Ventricular Rate:  86 PR Interval:  160 QRS Duration: 86 QT Interval:  418 QTC Calculation: 500 R Axis:   21 Text Interpretation: Sinus rhythm Borderline prolonged QT interval Confirmed  by Vonita Moss [phone]) on 12/27/2022 5:16:01 PM  Radiology DG Chest 2 View  Result Date: 12/27/2022 CLINICAL DATA:  Shortness of breath EXAM: CHEST - 2 VIEW COMPARISON:  X-ray 10/06/2022 and older FINDINGS: No consolidation, pneumothorax or effusion. No edema. Normal cardiopericardial silhouette. Overlapping cardiac leads. Mild degenerative changes of the thoracic spine. Stable mild compression of 2 adjacent midthoracic spine levels. Air-fluid level along the stomach beneath the left hemidiaphragm. IMPRESSION: No acute cardiopulmonary disease. Electronically Signed   By: Karen Kays M.D.   On: 12/27/2022 17:11    Procedures Procedures  {Document cardiac monitor, telemetry assessment procedure when appropriate:1}  Medications Ordered in ED Medications - No data to display  ED Course/ Medical Decision Making/ A&P   {   Click here for ABCD2, HEART and other calculatorsREFRESH Note before signing :1}                          Medical Decision Making Amount and/or Complexity of Data Reviewed Labs: ordered. Radiology: ordered.   ***  {Document critical care time when appropriate:1} {Document review of labs and clinical decision tools ie heart score, Chads2Vasc2 etc:1}  {Document your independent review of radiology images, and any outside records:1} {Document your discussion with family members, caretakers, and with consultants:1} {Document social determinants of health affecting pt's care:1} {Document  your decision making why or why not admission, treatments were needed:1} Final Clinical Impression(s) / ED Diagnoses Final diagnoses:  None    Rx / DC Orders ED Discharge Orders     None

## 2022-12-27 NOTE — ED Triage Notes (Signed)
Pt presents via EMS from home with c/o shortness of breath x 2 weeks. Per EMS, lungs are clear and equal. Pt reports that he has pain between his shoulder blades, present for one week. Vitals for EMS 120/66, HR 92, 99% RA, CBG 392 which pt reports this is normal. EMS did note some lower leg swelling, does only have one kidney per EMS.

## 2022-12-27 NOTE — Progress Notes (Signed)
Lower extremity venous left study completed.  Preliminary results relayed to [place], MD.   See CV Proc for preliminary results report.   Evangelynn Lochridge, RDMS, RVT  

## 2022-12-28 MED ORDER — FUROSEMIDE 20 MG PO TABS: 20.0000 mg | ORAL_TABLET | Freq: Every day | ORAL | 0 refills | Status: DC

## 2022-12-28 MED ORDER — POTASSIUM CHLORIDE CRYS ER 20 MEQ PO TBCR: 20.0000 meq | EXTENDED_RELEASE_TABLET | Freq: Every day | ORAL | 0 refills | Status: DC

## 2022-12-28 NOTE — Discharge Instructions (Addendum)
You were seen for your shortness of breath in the emergency department.   At home, please take the lasix we have given you for the next 3 days.    Check your MyChart online for the results of any tests that had not resulted by the time you left the emergency department.   Follow-up with your primary doctor in 2-3 days regarding your visit.  Talk to them about continuing the lasix to get more fluid off.   Return immediately to the emergency department if you experience any of the following: difficulty breathing, or any other concerning symptoms.    Thank you for visiting our Emergency Department. It was a pleasure taking care of you today.

## 2022-12-30 ENCOUNTER — Other Ambulatory Visit: Payer: Self-pay

## 2022-12-31 ENCOUNTER — Ambulatory Visit: Payer: Self-pay | Admitting: *Deleted

## 2022-12-31 ENCOUNTER — Other Ambulatory Visit: Payer: Self-pay | Admitting: Pharmacist

## 2022-12-31 DIAGNOSIS — E876 Hypokalemia: Secondary | ICD-10-CM

## 2022-12-31 DIAGNOSIS — K219 Gastro-esophageal reflux disease without esophagitis: Secondary | ICD-10-CM

## 2022-12-31 MED ORDER — PANTOPRAZOLE SODIUM 40 MG PO TBEC
40.0000 mg | DELAYED_RELEASE_TABLET | Freq: Two times a day (BID) | ORAL | 0 refills | Status: DC
Start: 2022-12-31 — End: 2023-01-22
  Filled 2022-12-31: qty 60, 30d supply, fill #0

## 2022-12-31 NOTE — Telephone Encounter (Signed)
Attempted to return call to Merrilee Jansky, mother (on [place]).   Left a message to call back.

## 2022-12-31 NOTE — Telephone Encounter (Signed)
Message from Otis Brace sent at 12/31/2022  8:48 AM EDT  Summary: ER fu   Pt 's mother called saying Gary Mitchell was I the ER and they told him he would meed to follow up with his provider in three days.  He has an appt with Dr. Alvis Lemmings on July 17th.   He was in the ER for SOB and lower extremities were swollen.  CB#  [phone]          Call History   Type Contact Phone/Fax User  12/31/2022 08:45 AM EDT Phone (Incoming) Henley, Boettner (Mother) [phone] Otis Brace

## 2022-12-31 NOTE — Telephone Encounter (Signed)
Message from Otis Brace sent at 12/31/2022  8:48 AM EDT  Summary: ER fu   Pt 's mother called saying Roldan was I the ER and they told him he would meed to follow up with his provider in three days.  He has an appt with Dr. Alvis Lemmings on July 17th.   He was in the ER for SOB and lower extremities were swollen.  CB#  [phone]         Chief Complaint: needs hospital f/u appt and Lasix, potassium and pantoprazole refiled Symptoms: swollen legs  Disposition: [] ED /[] Urgent Care (no appt availability in office) / [x] Appointment(In office/virtual)/ []  [place] [facility]/ [] Home Care/ [] Refused Recommended Disposition /[] [place] [facility]/ []  Follow-up with PCP Additional Notes: needs hospital f/u appt and refills of Lasix and Potassium- pt was given a 3 day supply only and pantoprazole  Reason for Disposition  [1] Prescription refill request for ESSENTIAL medicine (i.e., likelihood of harm to patient if not taken) AND [2] triager unable to refill per department policy  Answer Assessment - Initial Assessment Questions 1. DRUG NAME: "What medicine do you need to have refilled?"     Lasix , pantoprazole and Potassium called to [facility] 2. REFILLS REMAINING: "How many refills are remaining?" (Note: The label on the medicine or pill bottle will show how many refills are remaining. If there are no refills remaining, then a renewal may be needed.)     none 5. SYMPTOMS: "Do you have any symptoms?"     Just dc'd from ED for leg swelling and SOB but will run out on Thursday  Protocols used: Medication Refill and Renewal Call-A-AH

## 2023-01-01 ENCOUNTER — Other Ambulatory Visit: Payer: Self-pay

## 2023-01-01 MED ORDER — FUROSEMIDE 20 MG PO TABS
20.0000 mg | ORAL_TABLET | Freq: Every day | ORAL | 0 refills | Status: DC
  Filled 2023-01-01: qty 10, 10d supply, fill #0

## 2023-01-01 MED ORDER — POTASSIUM CHLORIDE CRYS ER 20 MEQ PO TBCR
20.0000 meq | EXTENDED_RELEASE_TABLET | Freq: Every day | ORAL | 0 refills | Status: DC
Start: 2023-01-01 — End: 2023-01-22
  Filled 2023-01-01: qty 10, 10d supply, fill #0

## 2023-01-01 NOTE — Addendum Note (Signed)
Addended by: Jonah Blue B on: 01/01/2023 10:45 AM   Modules accepted: Orders

## 2023-01-01 NOTE — Telephone Encounter (Signed)
Pt's mother has been called and informed of medication being sent

## 2023-01-06 DIAGNOSIS — Z419 Encounter for procedure for purposes other than remedying health state, unspecified: Secondary | ICD-10-CM | POA: Diagnosis not present

## 2023-01-22 ENCOUNTER — Encounter: Payer: Self-pay | Admitting: Family Medicine

## 2023-01-22 ENCOUNTER — Other Ambulatory Visit: Payer: Self-pay | Admitting: Family Medicine

## 2023-01-22 ENCOUNTER — Ambulatory Visit: Payer: 59 | Attending: Family Medicine | Admitting: Family Medicine

## 2023-01-22 VITALS — BP 100/66 | HR 79 | Ht 64.0 in | Wt 130.2 lb

## 2023-01-22 DIAGNOSIS — Z7689 Persons encountering health services in other specified circumstances: Secondary | ICD-10-CM | POA: Diagnosis not present

## 2023-01-22 DIAGNOSIS — I480 Paroxysmal atrial fibrillation: Secondary | ICD-10-CM | POA: Diagnosis not present

## 2023-01-22 DIAGNOSIS — K709 Alcoholic liver disease, unspecified: Secondary | ICD-10-CM

## 2023-01-22 DIAGNOSIS — E876 Hypokalemia: Secondary | ICD-10-CM

## 2023-01-22 DIAGNOSIS — F322 Major depressive disorder, single episode, severe without psychotic features: Secondary | ICD-10-CM

## 2023-01-22 DIAGNOSIS — E1142 Type 2 diabetes mellitus with diabetic polyneuropathy: Secondary | ICD-10-CM

## 2023-01-22 DIAGNOSIS — E1169 Type 2 diabetes mellitus with other specified complication: Secondary | ICD-10-CM | POA: Diagnosis not present

## 2023-01-22 DIAGNOSIS — K219 Gastro-esophageal reflux disease without esophagitis: Secondary | ICD-10-CM

## 2023-01-22 LAB — POCT GLYCOSYLATED HEMOGLOBIN (HGB A1C): HbA1c, POC (controlled diabetic range): 8.9 % — AB (ref 0.0–7.0)

## 2023-01-22 LAB — GLUCOSE, POCT (MANUAL RESULT ENTRY): POC Glucose: 175 mg/dl — AB (ref 70–99)

## 2023-01-22 MED ORDER — FOLIC ACID 1 MG PO TABS: 1.0000 mg | ORAL_TABLET | Freq: Every day | ORAL | 1 refills | Status: DC

## 2023-01-22 MED ORDER — GLIPIZIDE 10 MG PO TABS
20.0000 mg | ORAL_TABLET | Freq: Two times a day (BID) | ORAL | 1 refills | Status: DC
Start: 2023-01-22 — End: 2023-03-04

## 2023-01-22 MED ORDER — POTASSIUM CHLORIDE CRYS ER 20 MEQ PO TBCR
20.0000 meq | EXTENDED_RELEASE_TABLET | Freq: Every day | ORAL | 3 refills | Status: DC
Start: 2023-01-22 — End: 2023-04-02

## 2023-01-22 MED ORDER — EMPAGLIFLOZIN 25 MG PO TABS: 25.0000 mg | ORAL_TABLET | Freq: Every day | ORAL | 1 refills | Status: DC

## 2023-01-22 MED ORDER — METOPROLOL SUCCINATE ER 25 MG PO TB24
12.5000 mg | ORAL_TABLET | Freq: Every day | ORAL | 1 refills | Status: DC
Start: 2023-01-22 — End: 2023-04-02

## 2023-01-22 MED ORDER — FUROSEMIDE 20 MG PO TABS: 20.0000 mg | ORAL_TABLET | Freq: Every day | ORAL | 1 refills | Status: DC | PRN

## 2023-01-22 MED ORDER — PANTOPRAZOLE SODIUM 40 MG PO TBEC: 40.0000 mg | DELAYED_RELEASE_TABLET | Freq: Two times a day (BID) | ORAL | 2 refills | Status: DC
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
## 2023-01-22 NOTE — Patient Instructions (Signed)
Edema  Edema is when you have too much fluid in your body or under your skin. Edema may make your legs, feet, and ankles swell. Swelling often happens in looser tissues, such as around your eyes. This is a common condition. It gets more common as you get older. There are many possible causes of edema. These include: Eating too much salt (sodium). Being on your feet or sitting for a long time. Certain medical conditions, such as: Pregnancy. Heart failure. Liver disease. Kidney disease. Cancer. Hot weather may make edema worse. Edema is usually painless. Your skin may look swollen or shiny. Follow these instructions at home: Medicines Take over-the-counter and prescription medicines only as told by your doctor. Your doctor may prescribe a medicine to help your body get rid of extra water (diuretic). Take this medicine if you are told to take it. Eating and drinking Eat a low-salt (low-sodium) diet as told by your doctor. Sometimes, eating less salt may reduce swelling. Depending on the cause of your swelling, you may need to limit how much fluid you drink (fluid restriction). General instructions Raise the injured area above the level of your heart while you are sitting or lying down. Do not sit still or stand for a long time. Do not wear tight clothes. Do not wear garters on your upper legs. Exercise your legs. This can help the swelling go down. Wear compression stockings as told by your doctor. It is important that these are the right size. These should be prescribed by your doctor to prevent possible injuries. If elastic bandages or wraps are recommended, use them as told by your doctor. Contact a doctor if: Treatment is not working. You have heart, liver, or kidney disease and have symptoms of edema. You have sudden and unexplained weight gain. Get help right away if: You have shortness of breath or chest pain. You cannot breathe when you lie down. You have pain, redness, or  warmth in the swollen areas. You have heart, liver, or kidney disease and get edema all of a sudden. You have a fever and your symptoms get worse all of a sudden. These symptoms may be an emergency. Get help right away. Call 911. Do not wait to see if the symptoms will go away. Do not drive yourself to the hospital. Summary Edema is when you have too much fluid in your body or under your skin. Edema may make your legs, feet, and ankles swell. Swelling often happens in looser tissues, such as around your eyes. Raise the injured area above the level of your heart while you are sitting or lying down. Follow your doctor's instructions about diet and how much fluid you can drink. This information is not intended to replace advice given to you by your health care provider. Make sure you discuss any questions you have with your health care provider. Document Revised: 02/26/2021 Document Reviewed: 02/26/2021 Elsevier Patient Education  2024 Elsevier Inc.  

## 2023-01-23 LAB — BASIC METABOLIC PANEL
BUN/Creatinine Ratio: 29 — ABNORMAL HIGH (ref 9–20)
BUN: 23 mg/dL (ref 6–24)
CO2: 18 mmol/L — ABNORMAL LOW (ref 20–29)
Calcium: 10.1 mg/dL (ref 8.7–10.2)
Chloride: 91 mmol/L — ABNORMAL LOW (ref 96–106)
Creatinine, Ser: 0.8 mg/dL (ref 0.76–1.27)
Glucose: 171 mg/dL — ABNORMAL HIGH (ref 70–99)
Potassium: 3.8 mmol/L (ref 3.5–5.2)
Sodium: 129 mmol/L — ABNORMAL LOW (ref 134–144)
eGFR: 108 mL/min/{1.73_m2} (ref >59)

## 2023-01-27 ENCOUNTER — Telehealth: Payer: Self-pay | Admitting: Pharmacist

## 2023-01-27 NOTE — Telephone Encounter (Signed)
LIBERATE Study  Received referral for patient participation in the LIBERATE CGM Study. Contacted patient to discuss study and confirmed HIPAA identifiers. Confirmed patient was provided the LIBERATE Study Information Sheet and any questions were answered.   Confirmed that patient meets study criteria by having a diagnosis of Type 2 Diabetes, is not currently on insulin, and most recent A1c is >8%.  Patient provided verbal consent to participate in the study. Consent documented in electronic medical record.   - Confirmed that patient has a compatible smart phone to download Max 3 app. ([email or web address]) - Asked to download and create a Josephine Igo account prior to first study visit.  - Scheduled first study visit on 02/06/2023. Confirmed patient has transportation to this appointment.  - Discussed use of MyChart in this study. Confirmed patient has an active MyChart account and is aware of their log in information.  Patient aware of pre-visit questionnaire that will be sent 2 days prior to their scheduled study visit and they will plan to complete before the visit.   [name], PharmD, [place]y [place], CPP Clinical Pharmacist [facility] [phone]

## 2023-01-28 ENCOUNTER — Telehealth: Payer: Self-pay

## 2023-01-28 NOTE — Telephone Encounter (Signed)
A prior authorization request for Gary Mitchell has been submitted to insurance today via CoverMyMeds Key: [id number]

## 2023-01-30 ENCOUNTER — Other Ambulatory Visit: Payer: Self-pay

## 2023-02-06 ENCOUNTER — Ambulatory Visit: Payer: 59 | Attending: Family Medicine | Admitting: Pharmacist

## 2023-02-06 ENCOUNTER — Telehealth: Payer: Self-pay | Admitting: Pharmacist

## 2023-02-06 ENCOUNTER — Other Ambulatory Visit: Payer: Self-pay

## 2023-02-06 ENCOUNTER — Encounter: Payer: Self-pay | Admitting: Pharmacist

## 2023-02-06 DIAGNOSIS — Z419 Encounter for procedure for purposes other than remedying health state, unspecified: Secondary | ICD-10-CM | POA: Diagnosis not present

## 2023-02-06 DIAGNOSIS — E1169 Type 2 diabetes mellitus with other specified complication: Secondary | ICD-10-CM | POA: Diagnosis not present

## 2023-02-06 DIAGNOSIS — Z7984 Long term (current) use of oral hypoglycemic drugs: Secondary | ICD-10-CM | POA: Diagnosis not present

## 2023-02-06 DIAGNOSIS — Z794 Long term (current) use of insulin: Secondary | ICD-10-CM | POA: Diagnosis not present

## 2023-02-06 MED ORDER — LANTUS SOLOSTAR 100 UNIT/ML ~~LOC~~ SOPN
10.0000 [IU] | PEN_INJECTOR | Freq: Every day | SUBCUTANEOUS | 1 refills | Status: DC
Start: 2023-02-06 — End: 2023-03-04
  Filled 2023-02-06: qty 9, 84d supply, fill #0

## 2023-02-06 MED ORDER — FREESTYLE LIBRE 2 READER DEVI
0 refills | Status: DC
Start: 2023-02-06 — End: 2023-12-15
  Filled 2023-02-06: qty 1, 90d supply, fill #0

## 2023-02-06 MED ORDER — FREESTYLE LIBRE 2 SENSOR MISC
6 refills | Status: DC
Start: 2023-02-06 — End: 2023-06-04
  Filled 2023-02-06: qty 2, 28d supply, fill #0
  Filled 2023-03-04: qty 2, 28d supply, fill #1
  Filled 2023-04-03: qty 2, 28d supply, fill #2
  Filled 2023-05-13: qty 2, 28d supply, fill #3
  Filled 2023-06-04: qty 2, 28d supply, fill #4

## 2023-02-06 MED ORDER — TRUEPLUS 5-BEVEL PEN NEEDLES 32G X 4 MM MISC
0 refills | Status: DC
  Filled 2023-02-06: qty 100, 100d supply, fill #0

## 2023-02-06 NOTE — Progress Notes (Signed)
S:     No chief complaint on file.  52 y.o. male who presents for diabetes evaluation, education, and management. Patient arrives in good spirits and presents with his mother.   Patient was referred and last seen by Primary Care Provider, Dr. Alvis Lemmings, on 01/22/2023. He was originally referred for our LIBERATE study but he does not have a compatible phone.  PMH is significant for T2DM, hx of a fib w/ prior CVA, hx of acute pancreatitis secondary to alcohol use, GERD, alcoholic cirrhosis of the liver, esophageal varies. At last visit with Dr. Alvis Lemmings, A1c was 8.9%.   Patient reports Diabetes is longstanding. He has a stroke hx secondary to a fib. No known CAD, ACS, or other clinical ASCVD hx. No CHF or CKD hx. He does not have a hx of thyroid cancer but does have a hx of pancreatitis. Also, his body weight is on the lower side. He tells me today that he does not have a phone that he can use for the [place] [employer]. He does have MM. Of note, he endorses compliance with glipizide, metformin, and Jardiance. He tells me today his blood sugars at home are still mostly in the 300-400 range and endorses polyuria and polydipsia.   Family/Social History:  Fhx: DM Tobacco: former smoker (quit in 2019) Alcohol: no current use reported  Current diabetes medications include: metformin 500 mg tabs - takes 2 tablets (1000mg ) BID, glipizide 10 mg tabs - takes 2 tablets (20mg ) BID, Jardiance 25 mg daily Patient reports adherence to taking all medications as prescribed.   Insurance coverage: Managed Medicaid  Patient denies hypoglycemic events.  Reported home fasting blood sugars: 300-400s  Reported 2 hour post-meal/random blood sugars: 300-400s.  Patient reports polyuria and polydipsia.  Patient denies neuropathy (nerve pain). Patient reports visual changes. Patient reports self foot exams.   Patient reported dietary habits: -Does not adhere to a diabetic diet   Patient-reported exercise habits:  none   O:   ROS  Physical Exam  7 day average blood glucose: no glucometer with him.  No CGM in place.   Lab Results  Component Value Date   HGBA1C 8.9 (A) 01/22/2023   There were no vitals filed for this visit.  Lipid Panel     Component Value Date/Time   TRIG 120 07/09/2020 1240    Clinical Atherosclerotic Cardiovascular Disease (ASCVD): Yes  The ASCVD Risk score (Arnett DK, et al., 2019) failed to calculate for the following reasons:   The patient has a prior MI or stroke diagnosis   Patient is participating in a Managed Medicaid Plan:  Yes   A/P: Diabetes longstanding currently uncontrolled. Patient is able to verbalize appropriate hypoglycemia management plan. Medication adherence appears appropriate. He is not a good candidate for a GLP-1 RA at this time. I think the safest and most effective option is to add basal insulin at this time. We will start 10u daily of Lantus. He does not have a phone to use with Libre 3 but does have Medicaid. With the addition of insulin, I believe we have a good shot at PA approval. Will send rxns downstairs.  -Started Lantus 10u daily.  -Continue glipizide, metformin, and Jardiance at current doses for now.  [employer] 2 CGM supplies sent downstairs. Will work on PA.  -Patient educated on purpose, proper use, and potential adverse effects of insulin.  -Extensively discussed pathophysiology of diabetes, recommended lifestyle interventions, dietary effects on blood sugar control.  -Counseled on s/sx of and  management of hypoglycemia.  -Next A1c anticipated 04/2023.    Written patient instructions provided. Patient verbalized understanding of treatment plan.  Total time in face to face counseling 30 minutes.    Follow-up:  Pharmacist in 3-4 weeks.   [name], PharmD, Patsy [place], CPP Clinical Pharmacist [facility] [phone]

## 2023-02-06 NOTE — Telephone Encounter (Signed)
Can we submit a PA for the [employer] supplies since he is now on insulin?

## 2023-02-18 ENCOUNTER — Ambulatory Visit: Payer: 59 | Admitting: Gastroenterology

## 2023-02-25 ENCOUNTER — Emergency Department (HOSPITAL_COMMUNITY)
Admission: EM | Admit: 2023-02-25 | Discharge: 2023-02-26 | Disposition: A | Discharge status: Home / Self Care | Payer: 59 | Attending: Emergency Medicine | Admitting: Emergency Medicine

## 2023-02-25 ENCOUNTER — Emergency Department (HOSPITAL_COMMUNITY): Payer: 59

## 2023-02-25 ENCOUNTER — Other Ambulatory Visit: Payer: Self-pay

## 2023-02-25 ENCOUNTER — Encounter (HOSPITAL_COMMUNITY): Payer: Self-pay

## 2023-02-25 DIAGNOSIS — R4182 Altered mental status, unspecified: Secondary | ICD-10-CM | POA: Diagnosis not present

## 2023-02-25 DIAGNOSIS — E1165 Type 2 diabetes mellitus with hyperglycemia: Secondary | ICD-10-CM | POA: Insufficient documentation

## 2023-02-25 DIAGNOSIS — Y908 Blood alcohol level of 240 mg/100 ml or more: Secondary | ICD-10-CM | POA: Insufficient documentation

## 2023-02-25 DIAGNOSIS — Z79899 Other long term (current) drug therapy: Secondary | ICD-10-CM | POA: Insufficient documentation

## 2023-02-25 DIAGNOSIS — R739 Hyperglycemia, unspecified: Secondary | ICD-10-CM

## 2023-02-25 DIAGNOSIS — Z794 Long term (current) use of insulin: Secondary | ICD-10-CM | POA: Diagnosis not present

## 2023-02-25 DIAGNOSIS — I4891 Unspecified atrial fibrillation: Secondary | ICD-10-CM | POA: Diagnosis not present

## 2023-02-25 DIAGNOSIS — Z7984 Long term (current) use of oral hypoglycemic drugs: Secondary | ICD-10-CM | POA: Diagnosis not present

## 2023-02-25 DIAGNOSIS — F10229 Alcohol dependence with intoxication, unspecified: Secondary | ICD-10-CM | POA: Diagnosis not present

## 2023-02-25 DIAGNOSIS — F10920 Alcohol use, unspecified with intoxication, uncomplicated: Secondary | ICD-10-CM

## 2023-02-25 LAB — HEPATIC FUNCTION PANEL
ALT: 20 U/L (ref 0–44)
AST: 38 U/L (ref 15–41)
Albumin: 3.1 g/dL — ABNORMAL LOW (ref 3.5–5.0)
Alkaline Phosphatase: 165 U/L — ABNORMAL HIGH (ref 38–126)
Bilirubin, Direct: 1.1 mg/dL — ABNORMAL HIGH (ref 0.0–0.2)
Indirect Bilirubin: 1 mg/dL — ABNORMAL HIGH (ref 0.3–0.9)
Total Bilirubin: 2.1 mg/dL — ABNORMAL HIGH (ref 0.3–1.2)
Total Protein: 8.1 g/dL (ref 6.5–8.1)

## 2023-02-25 LAB — URINALYSIS, ROUTINE W REFLEX MICROSCOPIC
Bacteria, UA: NONE SEEN
Bilirubin Urine: NEGATIVE
Glucose, UA: >=500 mg/dL — AB
Hgb urine dipstick: NEGATIVE
Ketones, ur: NEGATIVE mg/dL
Leukocytes,Ua: NEGATIVE
Nitrite: NEGATIVE
Protein, ur: NEGATIVE mg/dL
Specific Gravity, Urine: 1.006 (ref 1.005–1.030)
pH: 7 (ref 5.0–8.0)

## 2023-02-25 LAB — ETHANOL: Alcohol, Ethyl (B): 359 mg/dL (ref <10)

## 2023-02-25 LAB — CBG MONITORING, ED: Glucose-Capillary: 293 mg/dL — ABNORMAL HIGH (ref 70–99)

## 2023-02-25 LAB — CBC WITH DIFFERENTIAL/PLATELET
Abs Immature Granulocytes: 0.01 10*3/uL (ref 0.00–0.07)
Basophils Absolute: 0.1 10*3/uL (ref 0.0–0.1)
Basophils Relative: 1 %
Eosinophils Absolute: 0.1 10*3/uL (ref 0.0–0.5)
Eosinophils Relative: 2 %
HCT: 32.6 % — ABNORMAL LOW (ref 39.0–52.0)
Hemoglobin: 10.5 g/dL — ABNORMAL LOW (ref 13.0–17.0)
Immature Granulocytes: 0 %
Lymphocytes Relative: 45 %
Lymphs Abs: 2.8 10*3/uL (ref 0.7–4.0)
MCH: 25.7 pg — ABNORMAL LOW (ref 26.0–34.0)
MCHC: 32.2 g/dL (ref 30.0–36.0)
MCV: 79.7 fL — ABNORMAL LOW (ref 80.0–100.0)
Monocytes Absolute: 0.9 10*3/uL (ref 0.1–1.0)
Monocytes Relative: 14 %
Neutro Abs: 2.3 10*3/uL (ref 1.7–7.7)
Neutrophils Relative %: 38 %
Platelets: 120 10*3/uL — ABNORMAL LOW (ref 150–400)
RBC: 4.09 MIL/uL — ABNORMAL LOW (ref 4.22–5.81)
RDW: 21.1 % — ABNORMAL HIGH (ref 11.5–15.5)
WBC: 6.2 10*3/uL (ref 4.0–10.5)
nRBC: 0 % (ref 0.0–0.2)

## 2023-02-25 LAB — BASIC METABOLIC PANEL
Anion gap: 16 — ABNORMAL HIGH (ref 5–15)
BUN: 12 mg/dL (ref 6–20)
CO2: 17 mmol/L — ABNORMAL LOW (ref 22–32)
Calcium: 8.9 mg/dL (ref 8.9–10.3)
Chloride: 102 mmol/L (ref 98–111)
Creatinine, Ser: 0.69 mg/dL (ref 0.61–1.24)
GFR, Estimated: >60 mL/min (ref >60)
Glucose, Bld: 373 mg/dL — ABNORMAL HIGH (ref 70–99)
Potassium: 3.4 mmol/L — ABNORMAL LOW (ref 3.5–5.1)
Sodium: 135 mmol/L (ref 135–145)

## 2023-02-25 LAB — RAPID URINE DRUG SCREEN, HOSP PERFORMED
Amphetamines: NOT DETECTED
Barbiturates: NOT DETECTED
Benzodiazepines: NOT DETECTED
Cocaine: NOT DETECTED
Opiates: NOT DETECTED
Tetrahydrocannabinol: NOT DETECTED

## 2023-02-25 LAB — MAGNESIUM: Magnesium: 1.5 mg/dL — ABNORMAL LOW (ref 1.7–2.4)

## 2023-02-25 MED ORDER — LACTATED RINGERS IV BOLUS
1000.0000 mL | Freq: Once | INTRAVENOUS | Status: AC
  Administered 2023-02-26: 1000 mL via INTRAVENOUS

## 2023-02-25 MED ORDER — LEVETIRACETAM 500 MG PO TABS: 500.0000 mg | ORAL_TABLET | Freq: Once | ORAL | Status: DC

## 2023-02-25 MED ORDER — INSULIN ASPART 100 UNIT/ML IJ SOLN
5.0000 [IU] | Freq: Once | INTRAMUSCULAR | Status: DC
  Filled 2023-02-25: qty 0.05

## 2023-02-25 MED ORDER — POTASSIUM CHLORIDE CRYS ER 20 MEQ PO TBCR
40.0000 meq | EXTENDED_RELEASE_TABLET | Freq: Once | ORAL | Status: AC
  Administered 2023-02-26: 40 meq via ORAL
  Filled 2023-02-25: qty 2

## 2023-02-25 MED ORDER — LEVETIRACETAM IN NACL 1000 MG/100ML IV SOLN: 1000.0000 mg | Freq: Once | INTRAVENOUS | Status: DC

## 2023-02-25 MED ORDER — MAGNESIUM SULFATE 2 GM/50ML IV SOLN
2.0000 g | Freq: Once | INTRAVENOUS | Status: AC
  Administered 2023-02-26: 2 g via INTRAVENOUS
  Filled 2023-02-25: qty 50

## 2023-02-25 MED ORDER — THIAMINE HCL 100 MG/ML IJ SOLN
100.0000 mg | Freq: Once | INTRAMUSCULAR | Status: AC
  Administered 2023-02-26: 100 mg via INTRAVENOUS
  Filled 2023-02-25: qty 2

## 2023-02-25 NOTE — ED Triage Notes (Signed)
Pt presents via EMS. EMS reports called out due to pt lying on ground. Hx of seizures per EMS. Was found by EMS A&O x4. Denies syncopal episode but does not remember lying on ground. + ETOH. Reports compliant with all medications prescibed.

## 2023-02-25 NOTE — ED Provider Notes (Addendum)
[place] EMERGENCY DEPARTMENT AT [facility] Provider Note   CSN: [id number] Arrival date & time: 02/25/23  2103     History  Chief Complaint  Patient presents with   Hyperglycemia    Gary Mitchell is a 52 y.o. male.  HPI Patient presents for intoxication.  Medical history includes alcohol abuse, otitis, hepatitis, seizures, GERD, cirrhosis, DM, atrial fibrillation, anemia.  EMS was called to the scene where patient was sleeping on the ground.  They state that he was easily awakened.  He was able to stand and walk a short distance with assistance.  When they checked his CBG, it was in the range of 400.  Patient states that he does take his medications as prescribed, except for his Eliquis.  He states that he did not drink for several weeks up until today.  Currently, he denies any areas of pain or any physical complaints.  Patient states that he went to the grocery store.  He subsequently woke up on the ground outside the grocery store surrounded by police.  He is unsure of how he ended up on the ground.    Home Medications Prior to Admission medications   Medication Sig Start Date End Date Taking? Authorizing Provider  Accu-Chek Softclix Lancets lancets Use to check blood sugar once daily. E11.69 12/20/22   Hoy Register, MD  atorvastatin (LIPITOR) 20 MG tablet Take 1 tablet (20 mg total) by mouth daily. 11/13/22   Corky Crafts, MD  Blood Glucose Monitoring Suppl (ACCU-CHEK GUIDE) w/Device KIT Use to check blood sugar once daily. E11.69 12/20/22   Hoy Register, MD  Continuous Glucose Receiver (FREESTYLE LIBRE 2 READER) DEVI Use to check blood sugar continuously throughout the day. 02/06/23   Hoy Register, MD  Continuous Glucose Sensor (FREESTYLE LIBRE 2 SENSOR) MISC Use to check blood sugar continuously throughout the day. Change sensors once every 14 days. E11.69 02/06/23   Hoy Register, MD  DULoxetine (CYMBALTA) 60 MG capsule Take 1 capsule (60 mg total) by  mouth daily. 10/16/22   Hoy Register, MD  empagliflozin (JARDIANCE) 25 MG TABS tablet Take 1 tablet (25 mg total) by mouth daily before breakfast. 01/22/23   Hoy Register, MD  feeding supplement (ENSURE ENLIVE / ENSURE PLUS) LIQD Take 237 mLs by mouth 2 (two) times daily between meals. 10/10/22   Lorin Glass, MD  folic acid (FOLVITE) 1 MG tablet Take 1 tablet (1 mg total) by mouth daily. 01/22/23   Hoy Register, MD  furosemide (LASIX) 20 MG tablet Take 1 tablet (20 mg total) by mouth daily as needed. 01/22/23   Hoy Register, MD  gabapentin (NEURONTIN) 300 MG capsule Take 2 capsules (600 mg total) by mouth at bedtime. 10/16/22   Hoy Register, MD  glipiZIDE (GLUCOTROL) 10 MG tablet Take 2 tablets (20 mg total) by mouth 2 (two) times daily before a meal. 01/22/23   Newlin, Enobong, MD  glucose blood (ACCU-CHEK GUIDE) test strip Use to check blood sugar once daily. E11.69 12/20/22   Hoy Register, MD  ibuprofen (ADVIL) 200 MG tablet Take 200 mg by mouth as needed. Uses twice weekly    [provider]  insulin glargine (LANTUS SOLOSTAR) 100 UNIT/ML Solostar Pen Inject 10 Units into the skin daily. 02/06/23   Hoy Register, MD  Insulin Pen Needle (TRUEPLUS 5-BEVEL PEN NEEDLES) 32G X 4 MM MISC Use to inject insulin once daily. 02/06/23   Hoy Register, MD  lactulose (CHRONULAC) 10 GM/15ML solution Take 30 mLs (20 g  total) by mouth 3 (three) times daily. 10/16/22   Hoy Register, MD  levETIRAcetam (KEPPRA) 500 MG tablet Take 1 tablet (500 mg total) by mouth 2 (two) times daily. 07/10/22 12/27/22  Hoy Register, MD  metFORMIN (GLUCOPHAGE) 500 MG tablet Take 2 tablets (1,000 mg total) by mouth 2 (two) times daily with a meal. 08/05/22   Hoy Register, MD  metoprolol succinate (TOPROL-XL) 25 MG 24 hr tablet Take 0.5 tablets (12.5 mg total) by mouth daily. 01/22/23   Hoy Register, MD  Multiple Vitamin (MULTIVITAMIN WITH MINERALS) TABS tablet Take 1 tablet by mouth daily.    [provider]  naltrexone (DEPADE) 50 MG tablet Take 1 tablet (50 mg total) by mouth daily. 10/03/20   Hoy Register, MD  ondansetron (ZOFRAN) 4 MG tablet Take 1 tablet (4 mg total) by mouth every 8 (eight) hours as needed for nausea or vomiting. 10/16/22   Hoy Register, MD  pantoprazole (PROTONIX) 40 MG tablet Take 1 tablet (40 mg total) by mouth 2 (two) times daily. 01/22/23   Hoy Register, MD  potassium chloride SA (KLOR-CON M) 20 MEQ tablet Take 1 tablet (20 mEq total) by mouth daily for 3 days. 01/22/23 01/25/23  Hoy Register, MD  predniSONE (DELTASONE) 20 MG tablet Take 2 tablets (40 mg total) by mouth daily. 10/17/22   Mansouraty, Netty Starring., MD  rifaximin (XIFAXAN) 550 MG TABS tablet Take 1 tablet (550 mg total) by mouth 2 (two) times daily. 10/30/22 02/27/23  Doree Albee, PA-C  thiamine (VITAMIN B-1) 100 MG tablet Take 1 tablet (100 mg total) by mouth daily. 05/13/22   Kathlen Mody, MD      Allergies    Iohexol    Review of Systems   Review of Systems  Respiratory:  Negative for shortness of breath.   Cardiovascular:  Negative for chest pain.  Gastrointestinal:  Negative for nausea.  All other systems reviewed and are negative.   Physical Exam Updated Vital Signs BP 134/88 (BP Location: Right Arm)   Pulse 94   Temp 97.7 F (36.5 C) (Oral)   Resp 18   SpO2 100%  Physical Exam Vitals and nursing note reviewed.  Constitutional:      General: He is not in acute distress.    Appearance: Normal appearance. He is well-developed. He is not ill-appearing, toxic-appearing or diaphoretic.  HENT:     Head: Normocephalic and atraumatic.     Right Ear: External ear normal.     Left Ear: External ear normal.     Mouth/Throat:     Mouth: Mucous membranes are moist.  Eyes:     Extraocular Movements: Extraocular movements intact.     Conjunctiva/sclera: Conjunctivae normal.  Cardiovascular:     Rate and Rhythm: Normal rate and regular rhythm.     Heart sounds: No murmur  heard. Pulmonary:     Effort: Pulmonary effort is normal. No respiratory distress.     Breath sounds: Normal breath sounds. No wheezing.  Chest:     Chest wall: No tenderness.  Abdominal:     General: There is no distension.     Palpations: Abdomen is soft.     Tenderness: There is no abdominal tenderness.  Musculoskeletal:        General: No swelling, tenderness or deformity. Normal range of motion.     Cervical back: Normal range of motion and neck supple.     Right lower leg: No edema.     Left lower leg: No edema.  Skin:    General: Skin is warm and dry.     Coloration: Skin is not jaundiced or pale.  Neurological:     General: No focal deficit present.     Mental Status: He is alert and oriented to person, place, and time.     Cranial Nerves: No cranial nerve deficit.     Sensory: No sensory deficit.     Motor: No weakness.     Coordination: Coordination normal.  Psychiatric:        Mood and Affect: Mood and affect normal.        Speech: Speech is slurred.        Behavior: Behavior normal. Behavior is cooperative.     ED Results / Procedures / Treatments   Labs (all labs ordered are listed, but only abnormal results are displayed) Labs Reviewed  CBC WITH DIFFERENTIAL/PLATELET - Abnormal; Notable for the following components:      Result Value   RBC 4.09 (*)    Hemoglobin 10.5 (*)    HCT 32.6 (*)    MCV 79.7 (*)    MCH 25.7 (*)    RDW 21.1 (*)    Platelets 120 (*)    All other components within normal limits  URINALYSIS, ROUTINE W REFLEX MICROSCOPIC - Abnormal; Notable for the following components:   Color, Urine STRAW (*)    APPearance HAZY (*)    Glucose, UA >=500 (*)    All other components within normal limits  BASIC METABOLIC PANEL - Abnormal; Notable for the following components:   Potassium 3.4 (*)    CO2 17 (*)    Glucose, Bld 373 (*)    Anion gap 16 (*)    All other components within normal limits  HEPATIC FUNCTION PANEL - Abnormal; Notable for  the following components:   Albumin 3.1 (*)    Alkaline Phosphatase 165 (*)    Total Bilirubin 2.1 (*)    Bilirubin, Direct 1.1 (*)    Indirect Bilirubin 1.0 (*)    All other components within normal limits  MAGNESIUM - Abnormal; Notable for the following components:   Magnesium 1.5 (*)    All other components within normal limits  ETHANOL - Abnormal; Notable for the following components:   Alcohol, Ethyl (B) 359 (*)    All other components within normal limits  CBG MONITORING, ED - Abnormal; Notable for the following components:   Glucose-Capillary 293 (*)    All other components within normal limits  RAPID URINE DRUG SCREEN, HOSP PERFORMED    EKG None  Radiology CT Head Wo Contrast  Result Date: 02/25/2023 CLINICAL DATA:  Mental status change, unknown cause. Found on ground. EXAM: CT HEAD WITHOUT CONTRAST TECHNIQUE: Contiguous axial images were obtained from the base of the skull through the vertex without intravenous contrast. RADIATION DOSE REDUCTION: This exam was performed according to the departmental dose-optimization program which includes automated exposure control, adjustment of the mA and/or kV according to patient size and/or use of iterative reconstruction technique. COMPARISON:  07/29/2022 FINDINGS: Brain: Mild age advanced cerebral atrophy. Patchy white matter low density compatible with chronic small vessel disease, stable. No acute intracranial abnormality. Specifically, no hemorrhage, hydrocephalus, mass lesion, acute infarction, or significant intracranial injury. Vascular: No hyperdense vessel or unexpected calcification. Skull: No acute calvarial abnormality. Sinuses/Orbits: No acute findings Other: None IMPRESSION: Atrophy, chronic microvascular disease. No acute intracranial abnormality. Electronically Signed   By: Charlett Nose M.D.   On: 02/25/2023 23:31   DG Chest  Portable 1 View  Result Date: 02/25/2023 CLINICAL DATA:  Altered mental status.  Found lying on  ground. EXAM: PORTABLE CHEST 1 VIEW COMPARISON:  12/27/2022 FINDINGS: Heart and mediastinal contours are within normal limits. No focal opacities or effusions. No acute bony abnormality. IMPRESSION: No active disease. Electronically Signed   By: Charlett Nose M.D.   On: 02/25/2023 23:15    Procedures Procedures    Medications Ordered in ED Medications  thiamine (VITAMIN B1) injection 100 mg (has no administration in time range)  lactated ringers bolus 1,000 mL (has no administration in time range)  magnesium sulfate IVPB 2 g 50 mL (has no administration in time range)  potassium chloride SA (KLOR-CON M) CR tablet 40 mEq (has no administration in time range)  insulin aspart (novoLOG) injection 5 Units (has no administration in time range)    ED Course/ Medical Decision Making/ A&P                                 Medical Decision Making Amount and/or Complexity of Data Reviewed Labs: ordered. Radiology: ordered.  Risk Prescription drug management.   This patient presents to the ED for concern of altered mental status, this involves an extensive number of treatment options, and is a complaint that carries with it a high risk of complications and morbidity.  The differential diagnosis includes alcohol intoxication, illicit drug use, seizure, derangements   Co morbidities that complicate the patient evaluation  alcohol abuse, otitis, hepatitis, seizures, GERD, cirrhosis, DM, atrial fibrillation, anemia   Additional history obtained:  Additional history obtained from EMS External records from outside source obtained and reviewed including EMR   Lab Tests:  I Ordered, and personally interpreted labs.  The pertinent results include: Hypomagnesemia is present.  Mild hypokalemia is present.  Hyperglycemia is present without evidence of DKA.  Ethanol markedly elevated at 359.   Imaging Studies ordered:  I ordered imaging studies including chest x-ray, CT head I independently  visualized and interpreted imaging which showed no acute findings I agree with the radiologist interpretation   Cardiac Monitoring: / EKG:  The patient was maintained on a cardiac monitor.  I personally viewed and interpreted the cardiac monitored which showed an underlying rhythm of: Sinus rhythm  Problem List / ED Course / Critical interventions / Medication management  Patient presents via EMS after found sleeping on the ground.  On arrival in the ED, he is awake and alert.  He does seem intoxicated.  He has no areas of pain or tenderness.  Breathing is unlabored.  Vital signs are currently normal.  EMS did find hyperglycemia.  Will check lab work and provide IV fluids.  Hyperglycemia is confirmed.  Fortunately, there is no evidence of DKA.  Patient has a markedly elevated ethanol level which does explain his altered mental status.  Because he is unsure if he fell.  Will obtain chest x-ray and CT head.  Patient's magnesium was found to be low and replacement was ordered.  Patient was also provided with thiamine.  Imaging studies did not show acute findings.  Patient does have a seizure history.  He states that he has been taking his Keppra but has been running out.  Will provide Keppra here.  Given his episode, it is possible that he had a seizure but unlikely in the setting of intoxication.  He will require time in the ED to metabolize intoxicating substances.  Care of  patient signed out to oncoming ED provider. I ordered medication including IV fluids for hydration; magnesium sulfate for hypomagnesemia; thiamine for alcohol abuse; insulin for hyperglycemia; Keppra for seizure prophylaxis; potassium chloride for hypokalemia Reevaluation of the patient after these medicines showed that the patient improved I have reviewed the patients home medicines and have made adjustments as needed   Social Determinants of Health:  Current alcohol abuse        Final Clinical Impression(s) / ED  Diagnoses Final diagnoses:  Alcoholic intoxication without complication (HCC)  Hyperglycemia    Rx / DC Orders ED Discharge Orders     None            Gloris Manchester, MD 02/25/23 [phone]

## 2023-02-26 LAB — CBG MONITORING, ED: Glucose-Capillary: 187 mg/dL — ABNORMAL HIGH (ref 70–99)

## 2023-02-26 MED ORDER — LEVETIRACETAM 500 MG PO TABS
1000.0000 mg | ORAL_TABLET | Freq: Once | ORAL | Status: AC
  Administered 2023-02-26: 1000 mg via ORAL
  Filled 2023-02-26: qty 2

## 2023-02-26 NOTE — ED Provider Notes (Signed)
I assumed care of this patient from previous provider.  Please see their note for further details of history, exam, and MDM.   Briefly patient is a 52 y.o. male who presented for alcohol intoxication. Found down - reportedly sleeping. Had extensive work up that was reassuring.  Pending MTF.  6:34 AM   Ambulated well. Clinically sober.  The patient appears reasonably screened and/or stabilized for discharge and I doubt any other medical condition or other [facility] requiring further screening, evaluation, or treatment in the ED at this time. I have discussed the findings, Dx and Tx plan with the patient/family who expressed understanding and agree(s) with the plan. Discharge instructions discussed at length. The patient/family was given strict return precautions who verbalized understanding of the instructions. No further questions at time of discharge.  Disposition: Discharge  Condition: Good  ED Discharge Orders     None       Follow Up: Hoy Register, MD [address] [place] [address] [phone]  Call  as needed         Nira Conn, MD 02/26/23 [phone]

## 2023-02-26 NOTE — ED Notes (Addendum)
Pt tolerated ambulation well no shortness breath and no breaks needed.

## 2023-03-02 NOTE — Progress Notes (Deleted)
S:     No chief complaint on file.  52 y.o. male who presents for diabetes evaluation, education, and management. Patient arrives in good spirits and presents with his mother.   Patient was referred and last seen by Primary Care Provider, Dr. Alvis Lemmings, on 01/22/2023. He was originally referred for our LIBERATE study but he does not have a compatible phone.  PMH is significant for T2DM, hx of a fib w/ prior CVA, hx of acute pancreatitis secondary to alcohol use, GERD, alcoholic cirrhosis of the liver, esophageal varies. At last visit with Dr. Alvis Lemmings, A1c was 8.9%.   Patient reports Diabetes is longstanding. He has a stroke hx secondary to a fib. No known CAD, ACS, or other clinical ASCVD hx. No CHF or CKD hx. He does not have a hx of thyroid cancer but does have a hx of pancreatitis. Also, his body weight is on the lower side. He tells me today that he does not have a phone that he can use for the [place] 3. He does have MM. Of note, he endorses compliance with glipizide, metformin, and Jardiance. He tells me today his blood sugars at home are still mostly in the 300-400 range and endorses polyuria and polydipsia.   Family/Social History:  Fhx: DM Tobacco: former smoker (quit in 2019) Alcohol: no current use reported  Current diabetes medications include: metformin 500 mg tabs - takes 2 tablets (1000mg ) BID, glipizide 10 mg tabs - takes 2 tablets (20mg ) BID, Jardiance 25 mg daily Patient reports adherence to taking all medications as prescribed.   Insurance coverage: Managed Medicaid  Patient denies hypoglycemic events.  Reported home fasting blood sugars: 300-400s  Reported 2 hour post-meal/random blood sugars: 300-400s.  Patient reports polyuria and polydipsia.  Patient denies neuropathy (nerve pain). Patient reports visual changes. Patient reports self foot exams.   Patient reported dietary habits: -Does not adhere to a diabetic diet   Patient-reported exercise habits:  none   O:   ROS  Physical Exam  7 day average blood glucose: no glucometer with him.  No CGM in place.   Lab Results  Component Value Date   HGBA1C 8.9 (A) 01/22/2023   There were no vitals filed for this visit.  Lipid Panel     Component Value Date/Time   TRIG 120 07/09/2020 1240    Clinical Atherosclerotic Cardiovascular Disease (ASCVD): Yes  The ASCVD Risk score (Arnett DK, et al., 2019) failed to calculate for the following reasons:   The patient has a prior MI or stroke diagnosis   Patient is participating in a Managed Medicaid Plan:  Yes   A/P: Diabetes longstanding currently uncontrolled. Patient is able to verbalize appropriate hypoglycemia management plan. Medication adherence appears appropriate. He is not a good candidate for a GLP-1 RA at this time. I think the safest and most effective option is to add basal insulin at this time. We will start 10u daily of Lantus. He does not have a phone to use with Libre 3 but does have Medicaid. With the addition of insulin, I believe we have a good shot at PA approval. Will send rxns downstairs.  -Started Lantus 10u daily.  -Continue glipizide, metformin, and Jardiance at current doses for now.  Josephine Igo 2 CGM supplies sent downstairs. Will work on PA.  -Patient educated on purpose, proper use, and potential adverse effects of insulin.  -Extensively discussed pathophysiology of diabetes, recommended lifestyle interventions, dietary effects on blood sugar control.  -Counseled on s/sx of and  management of hypoglycemia.  -Next A1c anticipated 04/2023.    Written patient instructions provided. Patient verbalized understanding of treatment plan.  Total time in face to face counseling 30 minutes.    Follow-up:  Pharmacist in 3-4 weeks.   [name], PharmD, Patsy [place], CPP Clinical Pharmacist [facility] [phone]

## 2023-03-03 ENCOUNTER — Ambulatory Visit: Payer: 59 | Admitting: Pharmacist

## 2023-03-03 DIAGNOSIS — Z7689 Persons encountering health services in other specified circumstances: Secondary | ICD-10-CM | POA: Diagnosis not present

## 2023-03-04 ENCOUNTER — Encounter: Payer: Self-pay | Admitting: Pharmacist

## 2023-03-04 ENCOUNTER — Other Ambulatory Visit: Payer: Self-pay

## 2023-03-04 ENCOUNTER — Ambulatory Visit: Payer: 59 | Attending: Family Medicine | Admitting: Pharmacist

## 2023-03-04 DIAGNOSIS — E1169 Type 2 diabetes mellitus with other specified complication: Secondary | ICD-10-CM | POA: Diagnosis not present

## 2023-03-04 DIAGNOSIS — Z794 Long term (current) use of insulin: Secondary | ICD-10-CM | POA: Diagnosis not present

## 2023-03-04 DIAGNOSIS — Z7984 Long term (current) use of oral hypoglycemic drugs: Secondary | ICD-10-CM

## 2023-03-04 MED ORDER — INSULIN LISPRO (1 UNIT DIAL) 100 UNIT/ML (KWIKPEN)
5.0000 [IU] | PEN_INJECTOR | Freq: Three times a day (TID) | SUBCUTANEOUS | 2 refills | Status: DC
  Filled 2023-03-04: qty 6, 40d supply, fill #0

## 2023-03-04 MED ORDER — LANTUS SOLOSTAR 100 UNIT/ML ~~LOC~~ SOPN
20.0000 [IU] | PEN_INJECTOR | Freq: Every day | SUBCUTANEOUS | 2 refills | Status: DC
Start: 2023-03-04 — End: 2023-04-02
  Filled 2023-03-04: qty 6, 30d supply, fill #0

## 2023-03-04 NOTE — Progress Notes (Signed)
S:     No chief complaint on file.  52 y.o. male who presents for diabetes evaluation, education, and management. Patient arrives in good spirits and presents with his mother.   Patient was referred and last seen by Primary Care Provider, Dr. Alvis Lemmings, on 01/22/2023. He was originally referred for our LIBERATE study but he does not have a compatible phone. PMH is significant for T2DM, hx of a fib w/ prior CVA, hx of acute pancreatitis secondary to alcohol use, GERD, alcoholic cirrhosis of the liver, esophageal varies. No known CAD, ACS, or other clinical ASCVD hx. No CHF or CKD hx.   At last visit with Dr. Alvis Lemmings, A1c was 8.9%.   Last pharmacist visit on 02/06/23. Reported BG still mostly 300-400 at home and endorsed polyuria and polydipsia. He reported adherence to glipizide, metformin, and Jardiance. Lantus 10 units daily was started and Libre 2 CGM supplies were prescribed. PA was approved and per epic fill hx picked up on 02/06/23 along with the Lantus.  Today, pt endorses high blood sugar readings despite compliance with insulin. He has no other complaints at this time.   Family/Social History:  Fhx: DM Tobacco: former smoker (quit in 2019) Alcohol: no current use reported  Current diabetes medications include: Lantus 10 units daily, metformin 500 mg tabs - takes 2 tablets (1000mg ) BID, glipizide 10 mg tabs - takes 2 tablets (20mg ) BID, Jardiance 25 mg daily (not taking) Patient reports adherence to taking all medications as prescribed.   Insurance coverage: Managed Medicaid  Patient denies hypoglycemic events.  Reported home fasting blood sugars: 300-HI  Reported 2 hour post-meal/random blood sugars: 300-HI  Patient reports polyuria and polydipsia.  Patient denies neuropathy (nerve pain). Patient reports visual changes. Patient reports self foot exams.   Patient reported dietary habits: -Does not adhere to a diabetic diet   Patient-reported exercise habits: none  O:   Libre2 sensor in place.  7-day avg: 336 mg/dl  Lab Results  Component Value Date   HGBA1C 8.9 (A) 01/22/2023   There were no vitals filed for this visit.  Lipid Panel     Component Value Date/Time   TRIG 120 07/09/2020 1240    Clinical Atherosclerotic Cardiovascular Disease (ASCVD): Yes  The ASCVD Risk score (Arnett DK, et al., 2019) failed to calculate for the following reasons:   The patient has a prior MI or stroke diagnosis   Patient is participating in a Managed Medicaid Plan:  Yes   A/P: Diabetes longstanding currently uncontrolled. Patient is able to verbalize appropriate hypoglycemia management plan. Medication adherence appears appropriate except for Jardiance. With that being sdaid, I do not think he is deriving benefit from a SU at this time and it is increasing his hypoglycemia risk. We discussed mealtime insulin + basal insulin and he verbalizes understanding. He is amenable to mealtime injections.  -Stop glipizide.  -Increase Lantus to 20u daily.  -Start Humalog 5u TID, take 15 minutes BEFORE meals.  -Continue metformin at current dose.  -Will take Jardiance off of list. Per pt, he is not taking this.   -Continue glipizide, metformin, and Jardiance at current doses for now.  -Patient educated on purpose, proper use, and potential adverse effects of insulin.  -Extensively discussed pathophysiology of diabetes, recommended lifestyle interventions, dietary effects on blood sugar control.  -Counseled on s/sx of and management of hypoglycemia.  -Next A1c anticipated 04/2023.   Written patient instructions provided. Patient verbalized understanding of treatment plan.  Total time in face to  face counseling 30 minutes.    Follow-up:  Pharmacist in 3-4 weeks.   [name], PharmD, Patsy Baltimore, CPP Clinical Pharmacist [facility] [phone]

## 2023-03-05 ENCOUNTER — Other Ambulatory Visit: Payer: Self-pay

## 2023-03-09 DIAGNOSIS — Z419 Encounter for procedure for purposes other than remedying health state, unspecified: Secondary | ICD-10-CM | POA: Diagnosis not present

## 2023-03-31 ENCOUNTER — Other Ambulatory Visit: Payer: Self-pay

## 2023-03-31 ENCOUNTER — Emergency Department (HOSPITAL_COMMUNITY): Payer: 59

## 2023-03-31 ENCOUNTER — Observation Stay (HOSPITAL_COMMUNITY)
Admission: EM | Admit: 2023-03-31 | Discharge: 2023-04-02 | Disposition: A | Discharge status: Home / Self Care | Payer: 59 | Attending: Internal Medicine | Admitting: Internal Medicine

## 2023-03-31 ENCOUNTER — Encounter (HOSPITAL_COMMUNITY): Payer: Self-pay | Admitting: Emergency Medicine

## 2023-03-31 DIAGNOSIS — E875 Hyperkalemia: Secondary | ICD-10-CM | POA: Insufficient documentation

## 2023-03-31 DIAGNOSIS — Z7984 Long term (current) use of oral hypoglycemic drugs: Secondary | ICD-10-CM | POA: Diagnosis not present

## 2023-03-31 DIAGNOSIS — G9341 Metabolic encephalopathy: Secondary | ICD-10-CM | POA: Diagnosis not present

## 2023-03-31 DIAGNOSIS — R4182 Altered mental status, unspecified: Secondary | ICD-10-CM | POA: Diagnosis present

## 2023-03-31 DIAGNOSIS — E7229 Other disorders of urea cycle metabolism: Secondary | ICD-10-CM | POA: Diagnosis not present

## 2023-03-31 DIAGNOSIS — Z23 Encounter for immunization: Secondary | ICD-10-CM | POA: Insufficient documentation

## 2023-03-31 DIAGNOSIS — K7682 Hepatic encephalopathy: Principal | ICD-10-CM | POA: Insufficient documentation

## 2023-03-31 DIAGNOSIS — E1169 Type 2 diabetes mellitus with other specified complication: Principal | ICD-10-CM

## 2023-03-31 DIAGNOSIS — F322 Major depressive disorder, single episode, severe without psychotic features: Secondary | ICD-10-CM

## 2023-03-31 DIAGNOSIS — Z79899 Other long term (current) drug therapy: Secondary | ICD-10-CM | POA: Insufficient documentation

## 2023-03-31 DIAGNOSIS — I4891 Unspecified atrial fibrillation: Secondary | ICD-10-CM | POA: Diagnosis not present

## 2023-03-31 DIAGNOSIS — Z87891 Personal history of nicotine dependence: Secondary | ICD-10-CM | POA: Insufficient documentation

## 2023-03-31 DIAGNOSIS — K709 Alcoholic liver disease, unspecified: Secondary | ICD-10-CM

## 2023-03-31 DIAGNOSIS — K219 Gastro-esophageal reflux disease without esophagitis: Secondary | ICD-10-CM

## 2023-03-31 DIAGNOSIS — G9340 Encephalopathy, unspecified: Secondary | ICD-10-CM | POA: Diagnosis present

## 2023-03-31 DIAGNOSIS — E1165 Type 2 diabetes mellitus with hyperglycemia: Secondary | ICD-10-CM

## 2023-03-31 DIAGNOSIS — E1142 Type 2 diabetes mellitus with diabetic polyneuropathy: Secondary | ICD-10-CM | POA: Diagnosis not present

## 2023-03-31 DIAGNOSIS — Z794 Long term (current) use of insulin: Secondary | ICD-10-CM

## 2023-03-31 DIAGNOSIS — R410 Disorientation, unspecified: Secondary | ICD-10-CM

## 2023-03-31 DIAGNOSIS — R17 Unspecified jaundice: Secondary | ICD-10-CM | POA: Diagnosis not present

## 2023-03-31 DIAGNOSIS — E7220 Disorder of urea cycle metabolism, unspecified: Secondary | ICD-10-CM | POA: Diagnosis present

## 2023-03-31 DIAGNOSIS — I480 Paroxysmal atrial fibrillation: Secondary | ICD-10-CM

## 2023-03-31 DIAGNOSIS — R569 Unspecified convulsions: Secondary | ICD-10-CM | POA: Diagnosis not present

## 2023-03-31 DIAGNOSIS — F1010 Alcohol abuse, uncomplicated: Secondary | ICD-10-CM | POA: Diagnosis present

## 2023-03-31 DIAGNOSIS — E119 Type 2 diabetes mellitus without complications: Secondary | ICD-10-CM

## 2023-03-31 DIAGNOSIS — R531 Weakness: Secondary | ICD-10-CM | POA: Diagnosis not present

## 2023-03-31 DIAGNOSIS — R739 Hyperglycemia, unspecified: Secondary | ICD-10-CM | POA: Diagnosis not present

## 2023-03-31 LAB — BASIC METABOLIC PANEL
Anion gap: 15 (ref 5–15)
BUN: 9 mg/dL (ref 6–20)
CO2: 20 mmol/L — ABNORMAL LOW (ref 22–32)
Calcium: 9.3 mg/dL (ref 8.9–10.3)
Chloride: 98 mmol/L (ref 98–111)
Creatinine, Ser: 0.49 mg/dL — ABNORMAL LOW (ref 0.61–1.24)
GFR, Estimated: >60 mL/min (ref >60)
Glucose, Bld: 395 mg/dL — ABNORMAL HIGH (ref 70–99)
Potassium: 3.8 mmol/L (ref 3.5–5.1)
Sodium: 133 mmol/L — ABNORMAL LOW (ref 135–145)

## 2023-03-31 LAB — URINALYSIS, ROUTINE W REFLEX MICROSCOPIC
Bacteria, UA: NONE SEEN
Bilirubin Urine: NEGATIVE
Glucose, UA: >=500 mg/dL — AB
Hgb urine dipstick: NEGATIVE
Ketones, ur: NEGATIVE mg/dL
Leukocytes,Ua: NEGATIVE
Nitrite: NEGATIVE
Protein, ur: NEGATIVE mg/dL
Specific Gravity, Urine: 1.014 (ref 1.005–1.030)
pH: 6 (ref 5.0–8.0)

## 2023-03-31 LAB — I-STAT CHEM 8, ED
BUN: 8 mg/dL (ref 6–20)
Calcium, Ion: 1.07 mmol/L — ABNORMAL LOW (ref 1.15–1.40)
Chloride: 100 mmol/L (ref 98–111)
Creatinine, Ser: 0.6 mg/dL — ABNORMAL LOW (ref 0.61–1.24)
Glucose, Bld: 404 mg/dL — ABNORMAL HIGH (ref 70–99)
HCT: 40 % (ref 39.0–52.0)
Hemoglobin: 13.6 g/dL (ref 13.0–17.0)
Potassium: 5.7 mmol/L — ABNORMAL HIGH (ref 3.5–5.1)
Sodium: 133 mmol/L — ABNORMAL LOW (ref 135–145)
TCO2: 22 mmol/L (ref 22–32)

## 2023-03-31 LAB — CBC WITH DIFFERENTIAL/PLATELET
Abs Immature Granulocytes: 0.01 10*3/uL (ref 0.00–0.07)
Basophils Absolute: 0.1 10*3/uL (ref 0.0–0.1)
Basophils Relative: 1 %
Eosinophils Absolute: 0.1 10*3/uL (ref 0.0–0.5)
Eosinophils Relative: 1 %
HCT: 34.7 % — ABNORMAL LOW (ref 39.0–52.0)
Hemoglobin: 11.1 g/dL — ABNORMAL LOW (ref 13.0–17.0)
Immature Granulocytes: 0 %
Lymphocytes Relative: 34 %
Lymphs Abs: 2.1 10*3/uL (ref 0.7–4.0)
MCH: 26 pg (ref 26.0–34.0)
MCHC: 32 g/dL (ref 30.0–36.0)
MCV: 81.3 fL (ref 80.0–100.0)
Monocytes Absolute: 0.9 10*3/uL (ref 0.1–1.0)
Monocytes Relative: 14 %
Neutro Abs: 3.1 10*3/uL (ref 1.7–7.7)
Neutrophils Relative %: 50 %
Platelets: 127 10*3/uL — ABNORMAL LOW (ref 150–400)
RBC: 4.27 MIL/uL (ref 4.22–5.81)
RDW: 24.4 % — ABNORMAL HIGH (ref 11.5–15.5)
WBC: 6.2 10*3/uL (ref 4.0–10.5)
nRBC: 0 % (ref 0.0–0.2)

## 2023-03-31 LAB — RAPID URINE DRUG SCREEN, HOSP PERFORMED
Amphetamines: NOT DETECTED
Barbiturates: NOT DETECTED
Benzodiazepines: NOT DETECTED
Cocaine: NOT DETECTED
Opiates: NOT DETECTED
Tetrahydrocannabinol: NOT DETECTED

## 2023-03-31 LAB — CBG MONITORING, ED: Glucose-Capillary: 383 mg/dL — ABNORMAL HIGH (ref 70–99)

## 2023-03-31 LAB — BLOOD GAS, VENOUS
Acid-Base Excess: 1.1 mmol/L (ref 0.0–2.0)
Bicarbonate: 25.1 mmol/L (ref 20.0–28.0)
O2 Saturation: 73.5 %
Patient temperature: 37
pCO2, Ven: 37 mmHg — ABNORMAL LOW (ref 44–60)
pH, Ven: 7.44 — ABNORMAL HIGH (ref 7.25–7.43)
pO2, Ven: 48 mmHg — ABNORMAL HIGH (ref 32–45)

## 2023-03-31 LAB — TROPONIN I (HIGH SENSITIVITY)
Troponin I (High Sensitivity): 5 ng/L (ref <18)
Troponin I (High Sensitivity): 6 ng/L (ref <18)

## 2023-03-31 LAB — MAGNESIUM: Magnesium: 1.3 mg/dL — ABNORMAL LOW (ref 1.7–2.4)

## 2023-03-31 LAB — HEPATIC FUNCTION PANEL
ALT: 19 U/L (ref 0–44)
AST: 46 U/L — ABNORMAL HIGH (ref 15–41)
Albumin: 3.5 g/dL (ref 3.5–5.0)
Alkaline Phosphatase: 132 U/L — ABNORMAL HIGH (ref 38–126)
Bilirubin, Direct: 1.5 mg/dL — ABNORMAL HIGH (ref 0.0–0.2)
Indirect Bilirubin: 1.6 mg/dL — ABNORMAL HIGH (ref 0.3–0.9)
Total Bilirubin: 3.1 mg/dL — ABNORMAL HIGH (ref 0.3–1.2)
Total Protein: 8.3 g/dL — ABNORMAL HIGH (ref 6.5–8.1)

## 2023-03-31 LAB — I-STAT CG4 LACTIC ACID, ED
Lactic Acid, Venous: 3.2 mmol/L (ref 0.5–1.9)
Lactic Acid, Venous: 3.1 mmol/L (ref 0.5–1.9)

## 2023-03-31 LAB — AMMONIA: Ammonia: 99 umol/L — ABNORMAL HIGH (ref 9–35)

## 2023-03-31 LAB — COOXEMETRY PANEL
Carboxyhemoglobin: 2.4 % — ABNORMAL HIGH (ref 0.5–1.5)
Methemoglobin: 0.8 % (ref 0.0–1.5)
O2 Saturation: 77.6 %
Total hemoglobin: 11.7 g/dL — ABNORMAL LOW (ref 12.0–16.0)

## 2023-03-31 LAB — HEMOGLOBIN A1C
Hgb A1c MFr Bld: 8.6 % — ABNORMAL HIGH (ref 4.8–5.6)
Mean Plasma Glucose: 200.12 mg/dL

## 2023-03-31 LAB — ETHANOL: Alcohol, Ethyl (B): 125 mg/dL — ABNORMAL HIGH (ref <10)

## 2023-03-31 LAB — GLUCOSE, CAPILLARY: Glucose-Capillary: 211 mg/dL — ABNORMAL HIGH (ref 70–99)

## 2023-03-31 MED ORDER — LACTULOSE 10 GM/15ML PO SOLN
30.0000 g | Freq: Once | ORAL | Status: AC
  Administered 2023-03-31: 30 g via ORAL
  Filled 2023-03-31: qty 60

## 2023-03-31 MED ORDER — LACTULOSE 10 GM/15ML PO SOLN
20.0000 g | Freq: Three times a day (TID) | ORAL | Status: DC
  Administered 2023-03-31 – 2023-04-02 (×5): 20 g via ORAL
  Filled 2023-03-31 (×5): qty 30

## 2023-03-31 MED ORDER — ADULT MULTIVITAMIN W/MINERALS CH
1.0000 | ORAL_TABLET | Freq: Every day | ORAL | Status: DC
  Administered 2023-03-31 – 2023-04-02 (×3): 1 via ORAL
  Filled 2023-03-31 (×3): qty 1

## 2023-03-31 MED ORDER — INSULIN GLARGINE-YFGN 100 UNIT/ML ~~LOC~~ SOLN
20.0000 [IU] | Freq: Every day | SUBCUTANEOUS | Status: DC
  Administered 2023-03-31 – 2023-04-01 (×2): 20 [IU] via SUBCUTANEOUS
  Filled 2023-03-31 (×3): qty 0.2

## 2023-03-31 MED ORDER — ENOXAPARIN SODIUM 40 MG/0.4ML IJ SOSY
40.0000 mg | PREFILLED_SYRINGE | Freq: Every day | INTRAMUSCULAR | Status: DC
  Administered 2023-03-31 – 2023-04-01 (×2): 40 mg via SUBCUTANEOUS
  Filled 2023-03-31 (×2): qty 0.4

## 2023-03-31 MED ORDER — INSULIN ASPART 100 UNIT/ML IJ SOLN
0.0000 [IU] | Freq: Three times a day (TID) | INTRAMUSCULAR | Status: DC
  Administered 2023-04-01: 5 [IU] via SUBCUTANEOUS
  Administered 2023-04-01: 8 [IU] via SUBCUTANEOUS
  Administered 2023-04-01: 2 [IU] via SUBCUTANEOUS
  Administered 2023-04-02 (×2): 3 [IU] via SUBCUTANEOUS
  Filled 2023-03-31: qty 0.15

## 2023-03-31 MED ORDER — ATORVASTATIN CALCIUM 10 MG PO TABS
20.0000 mg | ORAL_TABLET | Freq: Every day | ORAL | Status: DC
  Administered 2023-04-01 – 2023-04-02 (×2): 20 mg via ORAL
  Filled 2023-03-31 (×2): qty 2

## 2023-03-31 MED ORDER — ONDANSETRON HCL 4 MG PO TABS: 4.0000 mg | ORAL_TABLET | Freq: Four times a day (QID) | ORAL | Status: DC | PRN

## 2023-03-31 MED ORDER — METOPROLOL SUCCINATE ER 25 MG PO TB24
12.5000 mg | ORAL_TABLET | Freq: Every day | ORAL | Status: DC
  Administered 2023-04-01 – 2023-04-02 (×2): 12.5 mg via ORAL
  Filled 2023-03-31 (×2): qty 1

## 2023-03-31 MED ORDER — LEVETIRACETAM 500 MG PO TABS
500.0000 mg | ORAL_TABLET | Freq: Two times a day (BID) | ORAL | Status: DC
  Administered 2023-03-31 – 2023-04-02 (×4): 500 mg via ORAL
  Filled 2023-03-31 (×4): qty 1

## 2023-03-31 MED ORDER — CHLORDIAZEPOXIDE HCL 25 MG PO CAPS
25.0000 mg | ORAL_CAPSULE | Freq: Once | ORAL | Status: AC
  Administered 2023-03-31: 25 mg via ORAL
  Filled 2023-03-31: qty 1

## 2023-03-31 MED ORDER — ONDANSETRON HCL 4 MG/2ML IJ SOLN: 4.0000 mg | Freq: Four times a day (QID) | INTRAMUSCULAR | Status: DC | PRN

## 2023-03-31 MED ORDER — MAGNESIUM SULFATE 2 GM/50ML IV SOLN
2.0000 g | Freq: Once | INTRAVENOUS | Status: AC
  Administered 2023-03-31: 2 g via INTRAVENOUS
  Filled 2023-03-31: qty 50

## 2023-03-31 MED ORDER — LEVETIRACETAM IN NACL 1000 MG/100ML IV SOLN
1000.0000 mg | Freq: Once | INTRAVENOUS | Status: AC
  Administered 2023-03-31: 1000 mg via INTRAVENOUS
  Filled 2023-03-31: qty 100

## 2023-03-31 MED ORDER — THIAMINE HCL 100 MG/ML IJ SOLN
100.0000 mg | Freq: Every day | INTRAMUSCULAR | Status: DC
  Administered 2023-03-31: 100 mg via INTRAVENOUS
  Filled 2023-03-31 (×2): qty 2

## 2023-03-31 MED ORDER — INSULIN ASPART 100 UNIT/ML IJ SOLN
0.0000 [IU] | Freq: Every day | INTRAMUSCULAR | Status: DC
  Administered 2023-03-31: 2 [IU] via SUBCUTANEOUS
  Filled 2023-03-31: qty 0.05

## 2023-03-31 MED ORDER — THIAMINE MONONITRATE 100 MG PO TABS
100.0000 mg | ORAL_TABLET | Freq: Every day | ORAL | Status: DC
  Administered 2023-04-01 – 2023-04-02 (×2): 100 mg via ORAL
  Filled 2023-03-31 (×3): qty 1

## 2023-03-31 MED ORDER — DULOXETINE HCL 30 MG PO CPEP
60.0000 mg | ORAL_CAPSULE | Freq: Every day | ORAL | Status: DC
  Administered 2023-04-01 – 2023-04-02 (×2): 60 mg via ORAL
  Filled 2023-03-31 (×2): qty 2

## 2023-03-31 MED ORDER — LORAZEPAM 2 MG/ML IJ SOLN
1.0000 mg | Freq: Once | INTRAMUSCULAR | Status: AC
  Administered 2023-03-31: 1 mg via INTRAVENOUS
  Filled 2023-03-31: qty 1

## 2023-03-31 MED ORDER — FOLIC ACID 1 MG PO TABS
1.0000 mg | ORAL_TABLET | Freq: Every day | ORAL | Status: DC
  Administered 2023-03-31 – 2023-04-02 (×3): 1 mg via ORAL
  Filled 2023-03-31 (×3): qty 1

## 2023-03-31 MED ORDER — GABAPENTIN 300 MG PO CAPS
600.0000 mg | ORAL_CAPSULE | Freq: Every day | ORAL | Status: DC
  Administered 2023-04-01: 600 mg via ORAL
  Filled 2023-03-31: qty 2

## 2023-03-31 MED ORDER — SODIUM CHLORIDE 0.9 % IV SOLN: INTRAVENOUS | Status: DC

## 2023-03-31 MED ORDER — IBUPROFEN 200 MG PO TABS: 400.0000 mg | ORAL_TABLET | Freq: Four times a day (QID) | ORAL | Status: DC | PRN

## 2023-03-31 MED ORDER — PANTOPRAZOLE SODIUM 40 MG PO TBEC
40.0000 mg | DELAYED_RELEASE_TABLET | Freq: Two times a day (BID) | ORAL | Status: DC
  Administered 2023-03-31 – 2023-04-02 (×4): 40 mg via ORAL
  Filled 2023-03-31 (×4): qty 1

## 2023-03-31 MED ORDER — TRAZODONE HCL 50 MG PO TABS
25.0000 mg | ORAL_TABLET | Freq: Every evening | ORAL | Status: DC | PRN
  Administered 2023-03-31: 25 mg via ORAL
  Filled 2023-03-31: qty 1

## 2023-03-31 MED ORDER — ALBUTEROL SULFATE (2.5 MG/3ML) 0.083% IN NEBU: 2.5000 mg | INHALATION_SOLUTION | RESPIRATORY_TRACT | Status: DC | PRN

## 2023-03-31 MED ORDER — SODIUM ZIRCONIUM CYCLOSILICATE 5 G PO PACK
5.0000 g | PACK | Freq: Once | ORAL | Status: AC
  Administered 2023-03-31: 5 g via ORAL
  Filled 2023-03-31: qty 1

## 2023-03-31 MED ORDER — LORAZEPAM 1 MG PO TABS
1.0000 mg | ORAL_TABLET | ORAL | Status: DC | PRN
  Administered 2023-03-31: 1 mg via ORAL
  Filled 2023-03-31: qty 1

## 2023-03-31 MED ORDER — LACTATED RINGERS IV BOLUS
1000.0000 mL | Freq: Once | INTRAVENOUS | Status: AC
  Administered 2023-03-31: 1000 mL via INTRAVENOUS
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
## 2023-03-31 NOTE — ED Notes (Signed)
ED TO INPATIENT HANDOFF REPORT  Name/Age/Gender Gary Mitchell 52 y.o. male  Code Status    Code Status Orders  (From admission, onward)           Start     Ordered   03/31/23 1837  Full code  Continuous       Question:  By:  Answer:  Consent: discussion documented in EHR   03/31/23 1840           Code Status History     Date Active Date Inactive Code Status Order ID Comments User Context   10/07/2022 0018 10/09/2022 2140 Full Code [id number]  Jacques Navy, MD ED   05/06/2022 0308 05/12/2022 2013 Full Code [id number]  Therisa Doyne, MD ED   09/18/2020 0014 09/21/2020 2107 Full Code [id number]  Rometta Emery, MD Inpatient   07/09/2020 0501 07/10/2020 2017 Full Code [id number]  John Giovanni, MD ED   11/23/2019 2236 11/29/2019 1721 Full Code [id number]  Eduard Clos, MD ED       Home/SNF/Other Home  Chief Complaint Encephalopathy [G93.40]  Level of Care/Admitting Diagnosis ED Disposition     ED Disposition  Admit   Condition  --   Comment  [facility] [[id number]]  Level of Care: Med-Surg [16]  May place patient in observation at [facility] or [facility] if equivalent level of care is available:: Yes  Covid Evaluation: Asymptomatic - no recent exposure (last 10 days) testing not required  Diagnosis: Encephalopathy [291958]  Admitting Physician: Maryln Gottron [[id number]]  Attending Physician: Kirby Crigler, MIR [email or web address] [[id number]]          Medical History Past Medical History:  Diagnosis Date   Alcoholic hepatitis 11/2019   Anemia    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   Seizures (HCC)    Thrombocytopenia (HCC)     Allergies Allergies  Allergen Reactions   Iohexol Other (See Comments)    Unknown reaction at 9 days old Mom at bedside reported patient was given injections of iohexol in foot and resulted in "hole in foot or foot got infected"    IV Location/Drains/Wounds Patient Lines/Drains/Airways Status     Active Line/Drains/Airways     Name Placement date Placement time Site Days   Peripheral IV 03/31/23 20 G Anterior;Distal;Left Forearm 03/31/23  1538  Forearm  less than 1            Labs/Imaging Results for orders placed or performed during the hospital encounter of 03/31/23 (from the past 48 hour(s))  CBG monitoring, ED     Status: Abnormal   Collection Time: 03/31/23  3:12 PM  Result Value Ref Range   Glucose-Capillary 383 (H) 70 - 99 mg/dL    Comment: Glucose reference range applies only to samples taken after fasting for at least 8 hours.  CBC WITH DIFFERENTIAL     Status: Abnormal   Collection Time: 03/31/23  3:32 PM  Result Value Ref Range   WBC 6.2 4.0 -  10.5 K/uL   RBC 4.27 4.22 - 5.81 MIL/uL   Hemoglobin 11.1 (L) 13.0 - 17.0 g/dL   HCT 32.4 (L) 40.1 - 02.7 %   MCV 81.3 80.0 - 100.0 fL   MCH 26.0 26.0 - 34.0 pg   MCHC 32.0 30.0 - 36.0 g/dL   RDW 25.3 (H) 66.4 - 40.3 %   Platelets 127 (L) 150 - 400 K/uL   nRBC 0.0 0.0 - 0.2 %   Neutrophils Relative % 50 %   Neutro Abs 3.1 1.7 - 7.7 K/uL   Lymphocytes Relative 34 %   Lymphs Abs 2.1 0.7 - 4.0 K/uL   Monocytes Relative 14 %   Monocytes Absolute 0.9 0.1 - 1.0 K/uL   Eosinophils Relative 1 %   Eosinophils Absolute 0.1 0.0 - 0.5 K/uL   Basophils Relative 1 %   Basophils Absolute 0.1 0.0 - 0.1 K/uL   Immature Granulocytes 0 %   Abs Immature Granulocytes 0.01 0.00 - 0.07 K/uL   Target Cells PRESENT     Comment: Performed at [facility],  [address] [place], [place] [address]  Ammonia     Status: Abnormal   Collection Time: 03/31/23  3:32 PM  Result Value Ref Range   Ammonia 99 (H) 9 - 35 umol/L    Comment: HEMOLYSIS AT THIS LEVEL MAY AFFECT RESULT Performed at [facility], [address], [place], [place] [address]   Basic metabolic panel     Status: Abnormal   Collection Time: 03/31/23  3:32 PM  Result Value Ref Range   Sodium 133 (L) 135 - 145 mmol/L   Potassium 3.8 3.5 - 5.1 mmol/L   Chloride 98 98 - 111 mmol/L   CO2 20 (L) 22 - 32 mmol/L   Glucose, Bld 395 (H) 70 - 99 mg/dL    Comment: Glucose reference range applies only to samples taken after fasting for at least 8 hours.   BUN 9 6 - 20 mg/dL   Creatinine, Ser 7.56 (L) 0.61 - 1.24 mg/dL   Calcium 9.3 8.9 - 43.3 mg/dL   GFR, Estimated >29 >51 mL/min    Comment: (NOTE) Calculated using the CKD-EPI Creatinine Equation (2021)    Anion gap 15 5 - 15    Comment: Performed at [facility], [address], [place], [place] [address]  Hepatic function panel     Status: Abnormal   Collection Time: 03/31/23  3:32 PM  Result Value Ref Range   Total Protein 8.3 (H) 6.5 - 8.1 g/dL   Albumin 3.5 3.5 - 5.0 g/dL   AST 46 (H) 15 - 41 U/L   ALT 19 0 - 44 U/L   Alkaline Phosphatase 132 (H) 38 - 126 U/L   Total Bilirubin 3.1 (H) 0.3 - 1.2 mg/dL   Bilirubin, Direct 1.5 (H) 0.0 - 0.2 mg/dL   Indirect Bilirubin 1.6 (H) 0.3 - 0.9 mg/dL    Comment: Performed at [facility], [address], [place], [place] [address]  Ethanol     Status: Abnormal   Collection Time: 03/31/23  3:32 PM  Result Value Ref Range   Alcohol, Ethyl (B) 125 (H) <10 mg/dL    Comment: (NOTE) Lowest detectable limit for serum alcohol is 10 mg/dL.  For medical purposes only. Performed at [facility], [address], [place], [place] [address]   Magnesium     Status: Abnormal   Collection Time: 03/31/23  3:32 PM  Result Value Ref Range   Magnesium 1.3 (L) 1.7 - 2.4 mg/dL    Comment: Performed at [facility], [address], [place], [place] [address]  Troponin I (High Sensitivity)     Status: None   Collection Time: 03/31/23  3:32 PM  Result Value Ref Range   Troponin I (High Sensitivity) 5 <18 ng/L    Comment: (NOTE) Elevated high sensitivity troponin I (hsTnI) values and significant  changes across serial measurements may suggest ACS but many other  chronic and acute conditions are known to elevate hsTnI results.  Refer to the "Links" section for chest pain algorithms and additional  guidance. Performed at [facility], [address] [place], [place] [address]   Urinalysis, Routine w reflex microscopic -Urine, Clean Catch     Status: Abnormal   Collection Time: 03/31/23  3:37 PM  Result Value Ref Range   Color, Urine YELLOW YELLOW   APPearance CLEAR CLEAR   Specific Gravity, Urine 1.014 1.005 - 1.030   pH 6.0 5.0 - 8.0   Glucose, UA >=500 (A) NEGATIVE mg/dL   Hgb urine dipstick NEGATIVE NEGATIVE   Bilirubin Urine NEGATIVE NEGATIVE   Ketones, ur NEGATIVE NEGATIVE mg/dL   Protein, ur NEGATIVE NEGATIVE mg/dL   Nitrite NEGATIVE NEGATIVE   Leukocytes,Ua NEGATIVE NEGATIVE   RBC / HPF 0-5 0 - 5 RBC/hpf   WBC, UA 0-5 0 - 5 WBC/hpf   Bacteria, UA NONE SEEN NONE SEEN   Squamous Epithelial / HPF 0-5 0 - 5 /HPF    Comment: Performed at [facility], [address] [place], [place] [address]  Blood gas, venous     Status: Abnormal   Collection Time: 03/31/23  3:37 PM  Result Value Ref Range   pH, Ven 7.44 (H) 7.25 - 7.43   pCO2, Ven 37 (L) 44 - 60 mmHg   pO2, Ven 48 (H) 32 - 45 mmHg   Bicarbonate 25.1 20.0 - 28.0 mmol/L   Acid-Base Excess 1.1 0.0 - 2.0 mmol/L   O2 Saturation 73.5 %   Patient temperature 37.0     Comment: Performed at [facility], [address], [place], [place] [address]  .Cooxemetry Panel  (carboxy, met, total hgb, O2 sat)(NOT AT [facility] or [facility])     Status: Abnormal   Collection Time: 03/31/23  3:37 PM  Result Value Ref Range   Total hemoglobin 11.7 (L) 12.0 - 16.0 g/dL   O2 Saturation 13.0 %   Carboxyhemoglobin 2.4 (H) 0.5 - 1.5 %   Methemoglobin 0.8 0.0 - 1.5 %    Comment: Performed at [facility], [address] [place], [place] [address]  Urine rapid drug screen (hosp performed)     Status: None   Collection Time: 03/31/23  3:37 PM  Result Value Ref Range   Opiates NONE DETECTED NONE DETECTED   Cocaine NONE DETECTED NONE DETECTED   Benzodiazepines NONE DETECTED NONE DETECTED   Amphetamines NONE DETECTED NONE DETECTED   Tetrahydrocannabinol NONE DETECTED NONE DETECTED   Barbiturates NONE DETECTED NONE DETECTED    Comment: (NOTE) DRUG SCREEN FOR MEDICAL PURPOSES ONLY.  IF CONFIRMATION IS NEEDED FOR ANY PURPOSE, NOTIFY LAB WITHIN 5 DAYS.  LOWEST DETECTABLE LIMITS FOR URINE DRUG SCREEN Drug Class                     Cutoff (ng/mL) Amphetamine and metabolites    1000 Barbiturate and metabolites  200 Benzodiazepine                 200 Opiates and metabolites        300 Cocaine and metabolites        300 THC                            50 Performed at [facility], [address] [place], [place] [address]   I-Stat Chem 8, ED     Status: Abnormal   Collection Time: 03/31/23  3:44 PM  Result Value Ref Range   Sodium 133 (L) 135 - 145 mmol/L   Potassium 5.7 (H) 3.5 - 5.1 mmol/L   Chloride 100 98 - 111 mmol/L   BUN 8 6 - 20 mg/dL   Creatinine, Ser 6.04 (L) 0.61 - 1.24 mg/dL   Glucose, Bld 540 (H) 70 - 99 mg/dL    Comment: Glucose reference range applies only to samples taken after fasting for at least 8 hours.   Calcium, Ion 1.07 (L) 1.15 - 1.40 mmol/L   TCO2 22 22 - 32 mmol/L   Hemoglobin 13.6 13.0 - 17.0 g/dL   HCT 98.1 19.1 - 47.8 %  I-Stat Lactic Acid, ED     Status: Abnormal   Collection Time: 03/31/23  3:44 PM   Result Value Ref Range   Lactic Acid, Venous 3.2 (HH) 0.5 - 1.9 mmol/L   Comment NOTIFIED PHYSICIAN   Troponin I (High Sensitivity)     Status: None   Collection Time: 03/31/23  5:32 PM  Result Value Ref Range   Troponin I (High Sensitivity) 6 <18 ng/L    Comment: (NOTE) Elevated high sensitivity troponin I (hsTnI) values and significant  changes across serial measurements may suggest ACS but many other  chronic and acute conditions are known to elevate hsTnI results.  Refer to the "Links" section for chest pain algorithms and additional  guidance. Performed at [facility], [address] [place], [place] [address]   I-Stat Lactic Acid, ED     Status: Abnormal   Collection Time: 03/31/23  6:00 PM  Result Value Ref Range   Lactic Acid, Venous 3.1 (HH) 0.5 - 1.9 mmol/L   Comment NOTIFIED PHYSICIAN    CT HEAD WO CONTRAST  Result Date: 03/31/2023 CLINICAL DATA:  Mental status change, unknown cause EXAM: CT HEAD WITHOUT CONTRAST TECHNIQUE: Contiguous axial images were obtained from the base of the skull through the vertex without intravenous contrast. RADIATION DOSE REDUCTION: This exam was performed according to the departmental dose-optimization program which includes automated exposure control, adjustment of the mA and/or kV according to patient size and/or use of iterative reconstruction technique. COMPARISON:  CT head 02/25/2023. FINDINGS: Brain: No evidence of acute infarction, hemorrhage, hydrocephalus, extra-axial collection or mass lesion/mass effect. Vascular: No hyperdense vessel. Skull: No acute fracture. Sinuses/Orbits: Clear sinuses.  No acute orbital findings. Other: No mastoid effusions. IMPRESSION: No evidence of acute intracranial abnormality. Electronically Signed   By: Feliberto Harts M.D.   On: 03/31/2023 18:26    Pending Labs Unresulted Labs (From admission, onward)     Start     Ordered   04/01/23 0500  Comprehensive metabolic panel  Tomorrow  morning,   R        03/31/23 1840   04/01/23 0500  CBC  Tomorrow morning,   R        03/31/23 1840   03/31/23 1836  Hemoglobin A1c  (Glycemic Control (SSI)  Q 4 Hours / Glycemic Control (SSI)  AC +/- HS)  Once,   R       Comments: To assess prior glycemic control    03/31/23 1840            Vitals/Pain Today's Vitals   03/31/23 1800 03/31/23 1815 03/31/23 1831 03/31/23 1840  BP: (!) 145/94 138/83 (!) 153/109   Pulse:   81   Resp: 15 16 15    Temp:    98 F (36.7 C)  TempSrc:      SpO2:   100%   Weight:      Height:      PainSc:        Isolation Precautions No active isolations  Medications Medications  magnesium sulfate IVPB 2 g 50 mL (2 g Intravenous New Bag/Given 03/31/23 1818)  sodium zirconium cyclosilicate (LOKELMA) packet 5 g (has no administration in time range)  LORazepam (ATIVAN) tablet 1-4 mg (has no administration in time range)  thiamine (VITAMIN B1) tablet 100 mg (has no administration in time range)    Or  thiamine (VITAMIN B1) injection 100 mg (has no administration in time range)  folic acid (FOLVITE) tablet 1 mg (has no administration in time range)  multivitamin with minerals tablet 1 tablet (has no administration in time range)  atorvastatin (LIPITOR) tablet 20 mg (has no administration in time range)  metoprolol succinate (TOPROL-XL) 24 hr tablet 12.5 mg (has no administration in time range)  DULoxetine (CYMBALTA) DR capsule 60 mg (has no administration in time range)  insulin glargine-yfgn (SEMGLEE) injection 20 Units (has no administration in time range)  lactulose (CHRONULAC) 10 GM/15ML solution 20 g (has no administration in time range)  pantoprazole (PROTONIX) EC tablet 40 mg (has no administration in time range)  levETIRAcetam (KEPPRA) tablet 500 mg (has no administration in time range)  gabapentin (NEURONTIN) capsule 600 mg (has no administration in time range)  insulin aspart (novoLOG) injection 0-15 Units (has no administration in time  range)  insulin aspart (novoLOG) injection 0-5 Units (has no administration in time range)  enoxaparin (LOVENOX) injection 40 mg (has no administration in time range)  0.9 %  sodium chloride infusion (has no administration in time range)  ibuprofen (ADVIL) tablet 400 mg (has no administration in time range)  traZODone (DESYREL) tablet 25 mg (has no administration in time range)  ondansetron (ZOFRAN) tablet 4 mg (has no administration in time range)    Or  ondansetron (ZOFRAN) injection 4 mg (has no administration in time range)  albuterol (PROVENTIL) (2.5 MG/3ML) 0.083% nebulizer solution 2.5 mg (has no administration in time range)  lactated ringers bolus 1,000 mL (0 mLs Intravenous Stopped 03/31/23 1825)  levETIRAcetam (KEPPRA) IVPB 1000 mg/100 mL premix (0 mg Intravenous Stopped 03/31/23 1733)  lactulose (CHRONULAC) 10 GM/15ML solution 30 g (30 g Oral Given 03/31/23 1813)  LORazepam (ATIVAN) injection 1 mg (1 mg Intravenous Given 03/31/23 1850)  chlordiazePOXIDE (LIBRIUM) capsule 25 mg (25 mg Oral Given 03/31/23 1847)    Mobility walks with person assist

## 2023-03-31 NOTE — H&P (Addendum)
History and Physical  Gary Mitchell:[id number] DOB: 17-Jul-1970 DOA: 03/31/2023  PCP: Gary Register, MD   Chief Complaint: Confusion  HPI: Gary Mitchell is a 52 y.o. male with medical history significant for alcoholism, cirrhosis, chronic anemia, insulin-dependent type 2 diabetes, history of seizures, hyperlipidemia, atrial fibrillation not on anticoagulation being admitted to the hospital with encephalopathy likely multifactorial.  History is provided by my discussion with the ER provider, who spoke to the patient's aunt.  Apparently he lives with his mother in a trailer, today he was found outside the trailer where he lives, and there was a Air cabin crew.  Per ER providers discussion with the patient's family, he has been confused the last 2 to 3 days, but otherwise nothing unusual.  Patient is actually able to tell me about his medications, says that he has been taking all of his medications, and feeling fine lately, but that "something weird" has been going on with his phone.  ED Course: In the emergency department, patient has been afebrile and stable on room air.  Blood pressure has been elevated 153/109.  Lab work was done significant for stable anemia, potassium 5.7, [place]ium 1.3, normal renal function, AST 46, ALT 19, alk phos 132, ammonia 99, total bilirubin 3.1.'s are normal, lactate 3.2.  Urinalysis and drug screen are unremarkable.  Glucose 383, alcohol level 125.  Review of Systems: Please see HPI for pertinent positives and negatives. A complete 10 system review of systems could not be performed due to the patient's confusion.  Past Medical History:  Diagnosis Date   Alcoholic hepatitis 11/2019   Anemia    Cirrhosis with alcoholism (HCC) 11/2019   Coagulopathy (HCC)    Attributed to liver disease/cirrhosis   Diabetes mellitus without complication (HCC)    type 2   Neuromuscular disorder (HCC)    neuropathy  feet legs   Pancreatic lesion 11/2019   Initially concerning for  neoplasm but improved appearance on MRI 12/2019 at which time pseudocyst was most likely diagnosis.   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   Seizures (HCC)    Thrombocytopenia (HCC)    Past Surgical History:  Procedure Laterality Date   BIOPSY  08/02/2021   Procedure: BIOPSY;  Surgeon: Rachael Fee, MD;  Location: [facility];  Service: Endoscopy;;   BIOPSY  05/07/2022   Procedure: BIOPSY;  Surgeon: Jenel Lucks, MD;  Location: [facility];  Service: Gastroenterology;;   COLONOSCOPY WITH PROPOFOL N/A 05/08/2022   Procedure: COLONOSCOPY WITH PROPOFOL;  Surgeon: Jenel Lucks, MD;  Location: [facility];  Service: Gastroenterology;  Laterality: N/A;   ENTEROSCOPY N/A 09/20/2020   Procedure: ENTEROSCOPY;  Surgeon: Tressia Danas, MD;  Location: [facility] ENDOSCOPY;  Service: Gastroenterology;  Laterality: N/A;   ENTEROSCOPY N/A 05/11/2022   Procedure: ENTEROSCOPY;  Surgeon: Jenel Lucks, MD;  Location: [facility];  Service: Gastroenterology;  Laterality: N/A;   ESOPHAGOGASTRODUODENOSCOPY (EGD) WITH PROPOFOL N/A 08/02/2021   Procedure: ESOPHAGOGASTRODUODENOSCOPY (EGD) WITH PROPOFOL;  Surgeon: Rachael Fee, MD;  Location: [facility];  Service: Endoscopy;  Laterality: N/A;   ESOPHAGOGASTRODUODENOSCOPY (EGD) WITH PROPOFOL N/A 01/31/2022   Procedure: ESOPHAGOGASTRODUODENOSCOPY (EGD) WITH PROPOFOL;  Surgeon: Rachael Fee, MD;  Location: [facility];  Service: Gastroenterology;  Laterality: N/A;   ESOPHAGOGASTRODUODENOSCOPY (EGD) WITH PROPOFOL N/A 05/07/2022   Procedure: ESOPHAGOGASTRODUODENOSCOPY (EGD) WITH PROPOFOL;  Surgeon: Jenel Lucks, MD;  Location: [facility];  Service: Gastroenterology;  Laterality: N/A;   ESOPHAGOGASTRODUODENOSCOPY (EGD) WITH PROPOFOL N/A 06/13/2022   Procedure: ESOPHAGOGASTRODUODENOSCOPY (EGD) WITH PROPOFOL;  Surgeon: Meridee Score Netty Starring., MD;  Location: [facility]OS[facility];  Service: Gastroenterology;  Laterality: N/A;   EUS N/A 01/31/2022   Procedure: UPPER ENDOSCOPIC ULTRASOUND (EUS) RADIAL;  Surgeon: Rachael Fee, MD;  Location: [facility];  Service: Gastroenterology;  Laterality: N/A;   EUS N/A 06/13/2022   Procedure: UPPER ENDOSCOPIC ULTRASOUND (EUS) LINEAR;  Surgeon: Lemar Lofty., MD;  Location: [facility];  Service: Gastroenterology;  Laterality: N/A;   FINE NEEDLE ASPIRATION N/A 08/02/2021   Procedure: FINE NEEDLE ASPIRATION (FNA) LINEAR;  Surgeon: Rachael Fee, MD;  Location: [facility];  Service: Endoscopy;  Laterality: N/A;   FINE NEEDLE ASPIRATION N/A 01/31/2022   Procedure: FINE NEEDLE ASPIRATION (FNA) LINEAR;  Surgeon: Rachael Fee, MD;  Location: [facility];  Service: Gastroenterology;  Laterality: N/A;   FINE NEEDLE ASPIRATION N/A 06/13/2022   Procedure: FINE NEEDLE ASPIRATION (FNA) LINEAR;  Surgeon: Lemar Lofty., MD;  Location: [facility];  Service: Gastroenterology;  Laterality: N/A;   GIVENS CAPSULE STUDY N/A 05/09/2022   Procedure: GIVENS CAPSULE STUDY;  Surgeon: Jenel Lucks, MD;  Location: [facility];  Service: Gastroenterology;  Laterality: N/A;   HEMOSTASIS CLIP PLACEMENT  05/08/2022   Procedure: HEMOSTASIS CLIP PLACEMENT;  Surgeon: Jenel Lucks, MD;  Location: [facility];  Service: Gastroenterology;;   HEMOSTASIS CLIP PLACEMENT  05/11/2022   Procedure: HEMOSTASIS CLIP PLACEMENT;  Surgeon: Jenel Lucks, MD;  Location: [facility];  Service: Gastroenterology;;   HOT HEMOSTASIS N/A 06/13/2022   Procedure: HOT HEMOSTASIS (ARGON PLASMA COAGULATION/BICAP);  Surgeon: Lemar Lofty., MD;  Location: [facility];  Service: Gastroenterology;  Laterality: N/A;   left kidney removed     POLYPECTOMY  05/08/2022   Procedure: POLYPECTOMY;  Surgeon: Jenel Lucks, MD;  Location: [facility];  Service: Gastroenterology;;   UPPER ESOPHAGEAL ENDOSCOPIC ULTRASOUND (EUS) N/A  08/02/2021   Procedure: UPPER ESOPHAGEAL ENDOSCOPIC ULTRASOUND (EUS);  Surgeon: Rachael Fee, MD;  Location: [facility];  Service: Endoscopy;  Laterality: N/A;  Radial and Linear    Social History:  reports that he quit smoking about 5 years ago. His smoking use included cigarettes. He started smoking about 30 years ago. He has a 2.5 pack-year smoking history. He has been exposed to tobacco smoke. He has never used smokeless tobacco. He reports current alcohol use of about 10.0 standard drinks of alcohol per week. He reports that he does not currently use drugs after having used the following drugs: Marijuana.   Allergies  Allergen Reactions   Iohexol Other (See Comments)    Unknown reaction at 55 days old Mom at bedside reported patient was given injections of iohexol in foot and resulted in "hole in foot or foot got infected"    Family History  Problem Relation Age of Onset   Diabetes Mellitus II Mother    Colon cancer Neg Hx    Esophageal cancer Neg Hx      Prior to Admission medications   Medication Sig Start Date End Date Taking? Authorizing Provider  Accu-Chek Softclix Lancets lancets Use to check blood sugar once daily. E11.69 12/20/22   Gary Register, MD  atorvastatin (LIPITOR) 20 MG tablet Take 1 tablet (20 mg total) by mouth daily. 11/13/22   Corky Crafts, MD  Blood Glucose Monitoring Suppl (ACCU-CHEK GUIDE) w/Device KIT Use to check blood sugar once daily. E11.69 12/20/22   Gary Register, MD  Continuous  Glucose Receiver (FREESTYLE LIBRE 2 READER) DEVI Use to check blood sugar continuously throughout the day. 02/06/23   Gary Register, MD  Continuous Glucose Sensor (FREESTYLE LIBRE 2 SENSOR) MISC Use to check blood sugar continuously throughout the day. Change sensors once every 14 days. E11.69 02/06/23   Gary Register, MD  DULoxetine (CYMBALTA) 60 MG capsule Take 1 capsule (60 mg total) by mouth daily. 10/16/22   Gary Register, MD  feeding supplement (ENSURE ENLIVE  / ENSURE PLUS) LIQD Take 237 mLs by mouth 2 (two) times daily between meals. 10/10/22   Lorin Glass, MD  folic acid (FOLVITE) 1 MG tablet Take 1 tablet (1 mg total) by mouth daily. 01/22/23   Gary Register, MD  furosemide (LASIX) 20 MG tablet Take 1 tablet (20 mg total) by mouth daily as needed. 01/22/23   Gary Register, MD  gabapentin (NEURONTIN) 300 MG capsule Take 2 capsules (600 mg total) by mouth at bedtime. 10/16/22   Gary Register, MD  glucose blood (ACCU-CHEK GUIDE) test strip Use to check blood sugar once daily. E11.69 12/20/22   Gary Register, MD  ibuprofen (ADVIL) 200 MG tablet Take 200 mg by mouth as needed. Uses twice weekly    [provider]  insulin glargine (LANTUS SOLOSTAR) 100 UNIT/ML Solostar Pen Inject 20 Units into the skin daily. 03/04/23   Gary Register, MD  insulin lispro (HUMALOG KWIKPEN) 100 UNIT/ML KwikPen Inject 5 Units into the skin 3 (three) times daily. 03/04/23   Gary Register, MD  Insulin Pen Needle (TRUEPLUS 5-BEVEL PEN NEEDLES) 32G X 4 MM MISC Use to inject insulin once daily. 02/06/23   Gary Register, MD  lactulose (CHRONULAC) 10 GM/15ML solution Take 30 mLs (20 g total) by mouth 3 (three) times daily. 10/16/22   Gary Register, MD  levETIRAcetam (KEPPRA) 500 MG tablet Take 1 tablet (500 mg total) by mouth 2 (two) times daily. 07/10/22 04/04/23  Gary Register, MD  metFORMIN (GLUCOPHAGE) 500 MG tablet Take 2 tablets (1,000 mg total) by mouth 2 (two) times daily with a meal. 08/05/22   Gary Register, MD  metoprolol succinate (TOPROL-XL) 25 MG 24 hr tablet Take 0.5 tablets (12.5 mg total) by mouth daily. 01/22/23   Gary Register, MD  Multiple Vitamin (MULTIVITAMIN WITH MINERALS) TABS tablet Take 1 tablet by mouth daily.    [provider]  naltrexone (DEPADE) 50 MG tablet Take 1 tablet (50 mg total) by mouth daily. 10/03/20   Gary Register, MD  ondansetron (ZOFRAN) 4 MG tablet Take 1 tablet (4 mg total) by mouth every 8 (eight) hours as  needed for nausea or vomiting. 10/16/22   Gary Register, MD  pantoprazole (PROTONIX) 40 MG tablet Take 1 tablet (40 mg total) by mouth 2 (two) times daily. 01/22/23   Gary Register, MD  potassium chloride SA (KLOR-CON M) 20 MEQ tablet Take 1 tablet (20 mEq total) by mouth daily for 3 days. 01/22/23 01/25/23  Gary Register, MD  predniSONE (DELTASONE) 20 MG tablet Take 2 tablets (40 mg total) by mouth daily. 10/17/22   Mansouraty, Netty Starring., MD  thiamine (VITAMIN B-1) 100 MG tablet Take 1 tablet (100 mg total) by mouth daily. 05/13/22   Kathlen Mody, MD    Physical Exam: BP (!) 153/109   Pulse 81   Temp 97.9 F (36.6 C) (Oral)   Resp 15   Ht 5\' 4"  (1.626 m)   Wt 59.1 kg   SpO2 100%   BMI 22.36 kg/m   General:  Alert, oriented person  and place, calm, in no acute distress, looks comfortable and nontoxic Eyes: EOMI, clear conjuctivae, white sclerea Neck: supple, no masses, trachea mildline  Cardiovascular: RRR, no murmurs or rubs, no peripheral edema  Respiratory: clear to auscultation bilaterally, no wheezes, no crackles  Abdomen: soft, nontender, nondistended, normal bowel tones heard  Skin: dry, no rashes  Musculoskeletal: no joint effusions, normal range of motion  Psychiatric: appropriate affect, normal speech but seems to have some slight delusions Neurologic: extraocular muscles intact, clear speech, moving all extremities with intact sensorium         Labs on Admission:  Basic Metabolic Panel: Recent Labs  Lab 03/31/23 1532 03/31/23 1544  NA 133* 133*  K 3.8 5.7*  CL 98 100  CO2 20*  --   GLUCOSE 395* 404*  BUN 9 8  CREATININE 0.49* 0.60*  CALCIUM 9.3  --   MG 1.3*  --    Liver Function Tests: Recent Labs  Lab 03/31/23 1532  AST 46*  ALT 19  ALKPHOS 132*  BILITOT 3.1*  PROT 8.3*  ALBUMIN 3.5   No results for input(s): "LIPASE", "AMYLASE" in the last 168 hours. Recent Labs  Lab 03/31/23 1532  AMMONIA 99*   CBC: Recent Labs  Lab 03/31/23 1532  03/31/23 1544  WBC 6.2  --   NEUTROABS 3.1  --   HGB 11.1* 13.6  HCT 34.7* 40.0  MCV 81.3  --   PLT 127*  --    Cardiac Enzymes: No results for input(s): "CKTOTAL", "CKMB", "CKMBINDEX", "TROPONINI" in the last 168 hours.  BNP (last 3 results) Recent Labs    07/29/22 2200 10/06/22 1820 12/27/22 1754  BNP 40.7 44.4 216.8*    ProBNP (last 3 results) Recent Labs    05/03/22 1002  PROBNP <36    CBG: Recent Labs  Lab 03/31/23 1512  GLUCAP 383*    Radiological Exams on Admission: CT HEAD WO CONTRAST  Result Date: 03/31/2023 CLINICAL DATA:  Mental status change, unknown cause EXAM: CT HEAD WITHOUT CONTRAST TECHNIQUE: Contiguous axial images were obtained from the base of the skull through the vertex without intravenous contrast. RADIATION DOSE REDUCTION: This exam was performed according to the departmental dose-optimization program which includes automated exposure control, adjustment of the mA and/or kV according to patient size and/or use of iterative reconstruction technique. COMPARISON:  CT head 02/25/2023. FINDINGS: Brain: No evidence of acute infarction, hemorrhage, hydrocephalus, extra-axial collection or mass lesion/mass effect. Vascular: No hyperdense vessel. Skull: No acute fracture. Sinuses/Orbits: Clear sinuses.  No acute orbital findings. Other: No mastoid effusions. IMPRESSION: No evidence of acute intracranial abnormality. Electronically Signed   By: Feliberto Harts M.D.   On: 03/31/2023 18:26    Assessment/Plan MORIAH HUFFINES is a 52 y.o. male with medical history significant for alcoholism, cirrhosis, chronic anemia, insulin-dependent type 2 diabetes, history of seizures, hyperlipidemia, atrial fibrillation not on anticoagulation being admitted to the hospital with encephalopathy likely multifactorial.   Encephalopathy-currently etiology is unclear, though there are several possibilities including hepatic encephalopathy, alcohol withdrawal or possible seizure.   No evidence of acute infection (no leukocytosis, no fever), urine drug screen is negative, and CT scan of the head is nonacute. -Observation admission to MedSurg -Patient admits to not being consistent with his lactulose -Resume lactulose 3 times daily -He was loaded with IV Keppra, continue oral Keppra twice daily -Will place on Ativan per CIWA protocol, though currently no overt evidence of alcohol withdrawal.  Note alcohol level when he presented to the ER  last month was 359.  Hyperkalemia-unclear etiology, no inciting medications.  EKG personally reviewed, no acute changes. -Will give 1 dose of Lokelma, recheck in the morning  Hypomagnesemia-repleted with IV magnesium 2 g  GERD-Protonix twice daily  Epilepsy-continue Keppra as above  Insulin-dependent type 2 diabetes-not well-controlled -Carb controlled diet -Continue Lantus 20 units daily -Moderate dose sliding scale  Lactic acidosis-patient not meeting sepsis criteria, hemodynamically stable, potentially the result of possible seizure, or related to his liver dysfunction -Hydrate gently, and trend lactate  Peripheral neuropathy-bedtime gabapentin  DVT prophylaxis: Lovenox     Code Status: Full Code  Consults called: None  Admission status: Observation  Time spent: 49 minutes  Shevelle Smither Sharlette Dense MD [facility] Pager [phone]  If 7PM-7AM, please contact night-coverage [email or web address] Password [facility]  03/31/2023, 6:41 PM

## 2023-04-01 DIAGNOSIS — G9340 Encephalopathy, unspecified: Secondary | ICD-10-CM | POA: Diagnosis not present

## 2023-04-01 LAB — GLUCOSE, CAPILLARY
Glucose-Capillary: 230 mg/dL — ABNORMAL HIGH (ref 70–99)
Glucose-Capillary: 288 mg/dL — ABNORMAL HIGH (ref 70–99)
Glucose-Capillary: 128 mg/dL — ABNORMAL HIGH (ref 70–99)
Glucose-Capillary: 88 mg/dL (ref 70–99)

## 2023-04-01 LAB — COMPREHENSIVE METABOLIC PANEL
ALT: 17 U/L (ref 0–44)
AST: 45 U/L — ABNORMAL HIGH (ref 15–41)
Albumin: 2.8 g/dL — ABNORMAL LOW (ref 3.5–5.0)
Alkaline Phosphatase: 110 U/L (ref 38–126)
Anion gap: 10 (ref 5–15)
BUN: <5 mg/dL — ABNORMAL LOW (ref 6–20)
CO2: 20 mmol/L — ABNORMAL LOW (ref 22–32)
Calcium: 8.9 mg/dL (ref 8.9–10.3)
Chloride: 105 mmol/L (ref 98–111)
Creatinine, Ser: <0.3 mg/dL — ABNORMAL LOW (ref 0.61–1.24)
Glucose, Bld: 158 mg/dL — ABNORMAL HIGH (ref 70–99)
Potassium: 3.4 mmol/L — ABNORMAL LOW (ref 3.5–5.1)
Sodium: 135 mmol/L (ref 135–145)
Total Bilirubin: 3 mg/dL — ABNORMAL HIGH (ref 0.3–1.2)
Total Protein: 6.9 g/dL (ref 6.5–8.1)

## 2023-04-01 LAB — CBC
HCT: 31.8 % — ABNORMAL LOW (ref 39.0–52.0)
Hemoglobin: 10.7 g/dL — ABNORMAL LOW (ref 13.0–17.0)
MCH: 26.2 pg (ref 26.0–34.0)
MCHC: 33.6 g/dL (ref 30.0–36.0)
MCV: 77.8 fL — ABNORMAL LOW (ref 80.0–100.0)
Platelets: 79 10*3/uL — ABNORMAL LOW (ref 150–400)
RBC: 4.09 MIL/uL — ABNORMAL LOW (ref 4.22–5.81)
RDW: 23.6 % — ABNORMAL HIGH (ref 11.5–15.5)
WBC: 3.9 10*3/uL — ABNORMAL LOW (ref 4.0–10.5)
nRBC: 0 % (ref 0.0–0.2)

## 2023-04-01 MED ORDER — ORAL CARE MOUTH RINSE: 15.0000 mL | OROMUCOSAL | Status: DC | PRN

## 2023-04-01 MED ORDER — POTASSIUM CHLORIDE CRYS ER 20 MEQ PO TBCR
40.0000 meq | EXTENDED_RELEASE_TABLET | Freq: Once | ORAL | Status: AC
  Administered 2023-04-01: 40 meq via ORAL
  Filled 2023-04-01: qty 4

## 2023-04-01 MED ORDER — PNEUMOCOCCAL 20-VAL CONJ VACC 0.5 ML IM SUSY
0.5000 mL | PREFILLED_SYRINGE | INTRAMUSCULAR | Status: AC
  Administered 2023-04-02: 0.5 mL via INTRAMUSCULAR
  Filled 2023-04-01: qty 0.5

## 2023-04-01 MED ORDER — INSULIN LISPRO (1 UNIT DIAL) 100 UNIT/ML (KWIKPEN): 5.0000 [IU] | PEN_INJECTOR | Freq: Three times a day (TID) | SUBCUTANEOUS | Status: DC

## 2023-04-01 MED ORDER — INSULIN ASPART 100 UNIT/ML IJ SOLN
5.0000 [IU] | Freq: Three times a day (TID) | INTRAMUSCULAR | Status: DC
  Administered 2023-04-01 – 2023-04-02 (×3): 5 [IU] via SUBCUTANEOUS

## 2023-04-01 NOTE — Plan of Care (Signed)
  Problem: Clinical Measurements: Goal: Ability to maintain clinical measurements within normal limits will improve Outcome: Progressing Goal: Diagnostic test results will improve Outcome: Progressing   Problem: Activity: Goal: Risk for activity intolerance will decrease Outcome: Progressing   Problem: Nutrition: Goal: Adequate nutrition will be maintained Outcome: Progressing   Problem: Elimination: Goal: Will not experience complications related to bowel motility Outcome: Progressing Goal: Will not experience complications related to urinary retention Outcome: Progressing   Problem: Safety: Goal: Ability to remain free from injury will improve Outcome: Progressing

## 2023-04-01 NOTE — Plan of Care (Signed)
  Problem: Clinical Measurements: Goal: Diagnostic test results will improve Outcome: Progressing Goal: Respiratory complications will improve Outcome: Progressing Goal: Cardiovascular complication will be avoided Outcome: Progressing   Problem: Coping: Goal: Level of anxiety will decrease Outcome: Progressing   Problem: Elimination: Goal: Will not experience complications related to urinary retention Outcome: Progressing   Problem: Pain Managment: Goal: General experience of comfort will improve Outcome: Progressing   Problem: Skin Integrity: Goal: Risk for impaired skin integrity will decrease Outcome: Progressing

## 2023-04-01 NOTE — Progress Notes (Addendum)
PROGRESS NOTE    Gary Mitchell  OZH:[id number] DOB: [birth year] DOA: 03/31/2023 PCP: Hoy Register, MD    Brief Narrative:  Gary Mitchell is a 52 y.o. male with medical history significant for alcoholism, cirrhosis, chronic anemia, insulin-dependent type 2 diabetes, history of seizures, hyperlipidemia, atrial fibrillation not on anticoagulation being admitted to the hospital with encephalopathy likely multifactorial.   Apparently he lives with his mother in a trailer, today he was found outside the trailer where he lives, and there was a Air cabin crew.  Per ER providers discussion with the patient's family, he has been confused the last 2 to 3 days, but otherwise nothing unusual.  Patient is actually able to tell me about his medications, says that he has been taking all of his medications, and feeling fine lately, but that "something weird" has been going on with his phone.   Did admit to drinking some "beer"   Assessment and Plan:  Encephalopathy -currently etiology is unclear, though there are several possibilities including hepatic encephalopathy, alcohol withdrawal or possible seizure.  No evidence of acute infection (no leukocytosis, no fever), urine drug screen is negative, and CT scan of the head is nonacute. -Patient admits to not being consistent with his lactulose--Resume lactulose 3 times daily - loaded with IV Keppra, continue oral Keppra twice daily -Will place on Ativan per CIWA protocol, though currently no overt evidence of alcohol withdrawal- level was 125 in the ER yesterday -carboxyhgb elevated-- recheck -A+Ox 3 but odd affect, slow speech at times   Hyperkalemia -resolved   Hypomagnesemia -repleted    GERD -Protonix twice daily   Epilepsy -continue Keppra   Insulin-dependent type 2 diabetes-not well-controlled -Carb controlled diet -Continue Lantus 20 units daily -Moderate dose sliding scale   Lactic acidosis -recheck after IVF   Peripheral  neuropathy -bedtime gabapentin    ambulate  DVT prophylaxis: enoxaparin (LOVENOX) injection 40 mg Start: 03/31/23 2200 SCDs Start: 03/31/23 1836    Code Status: Full Code    Disposition Plan:  Level of care: Med-Surg Status is: Observation The patient will require care spanning > 2 midnights and should be moved to inpatient     Consultants:  none   Subjective: Did have some beer yesterday but he says his main issue was his phone was malfunctioning yesterday and that caused all his problems  Objective: Vitals:   03/31/23 1840 03/31/23 2028 04/01/23 0013 04/01/23 0433  BP:  (!) 141/98 (!) 152/99 (!) 138/92  Pulse:  82 83 77  Resp:  16 16 16   Temp: 98 F (36.7 C) 98.2 F (36.8 C) 98.3 F (36.8 C) 98.1 F (36.7 C)  TempSrc:  Oral Oral Axillary  SpO2:  100% 100% 98%  Weight:      Height:        Intake/Output Summary (Last 24 hours) at 04/01/2023 1042 Last data filed at 04/01/2023 6295 Gross per 24 hour  Intake 1580 ml  Output 1450 ml  Net 130 ml   Filed Weights   03/31/23 1509  Weight: 59.1 kg    Examination:   General: Appearance:    Older than stated age male in no acute distress     Lungs:     respirations unlabored  Heart:    Normal heart rate.     MS:   All extremities are intact.    Neurologic:   Awake, alert, oriented x 3. No apparent focal neurological           defect.  Data Reviewed: I have personally reviewed following labs and imaging studies  CBC: Recent Labs  Lab 03/31/23 1532 03/31/23 1544 04/01/23 0604  WBC 6.2  --  3.9*  NEUTROABS 3.1  --   --   HGB 11.1* 13.6 10.7*  HCT 34.7* 40.0 31.8*  MCV 81.3  --  77.8*  PLT 127*  --  79*   Basic Metabolic Panel: Recent Labs  Lab 03/31/23 1532 03/31/23 1544 04/01/23 0604  NA 133* 133* 135  K 3.8 5.7* 3.4*  CL 98 100 105  CO2 20*  --  20*  GLUCOSE 395* 404* 158*  BUN 9 8 <5*  CREATININE 0.49* 0.60* <0.30*  CALCIUM 9.3  --  8.9  MG 1.3*  --   --    GFR: CrCl cannot  be calculated (This lab value cannot be used to calculate CrCl because it is not a number: <0.30). Liver Function Tests: Recent Labs  Lab 03/31/23 1532 04/01/23 0604  AST 46* 45*  ALT 19 17  ALKPHOS 132* 110  BILITOT 3.1* 3.0*  PROT 8.3* 6.9  ALBUMIN 3.5 2.8*   No results for input(s): "LIPASE", "AMYLASE" in the last 168 hours. Recent Labs  Lab 03/31/23 1532  AMMONIA 99*   Coagulation Profile: No results for input(s): "INR", "PROTIME" in the last 168 hours. Cardiac Enzymes: No results for input(s): "CKTOTAL", "CKMB", "CKMBINDEX", "TROPONINI" in the last 168 hours. BNP (last 3 results) Recent Labs    05/03/22 1002  PROBNP <36   HbA1C: Recent Labs    03/31/23 1532  HGBA1C 8.6*   CBG: Recent Labs  Lab 03/31/23 1512 03/31/23 2218 04/01/23 0805  GLUCAP 383* 211* 230*   Lipid Profile: No results for input(s): "CHOL", "HDL", "LDLCALC", "TRIG", "CHOLHDL", "LDLDIRECT" in the last 72 hours. Thyroid Function Tests: No results for input(s): "TSH", "T4TOTAL", "FREET4", "T3FREE", "THYROIDAB" in the last 72 hours. Anemia Panel: No results for input(s): "VITAMINB12", "FOLATE", "FERRITIN", "TIBC", "IRON", "RETICCTPCT" in the last 72 hours. Sepsis Labs: Recent Labs  Lab 03/31/23 1544 03/31/23 1800  LATICACIDVEN 3.2* 3.1*    No results found for this or any previous visit (from the past 240 hour(s)).       Radiology Studies: CT HEAD WO CONTRAST  Result Date: 03/31/2023 CLINICAL DATA:  Mental status change, unknown cause EXAM: CT HEAD WITHOUT CONTRAST TECHNIQUE: Contiguous axial images were obtained from the base of the skull through the vertex without intravenous contrast. RADIATION DOSE REDUCTION: This exam was performed according to the departmental dose-optimization program which includes automated exposure control, adjustment of the mA and/or kV according to patient size and/or use of iterative reconstruction technique. COMPARISON:  CT head 02/25/2023. FINDINGS:  Brain: No evidence of acute infarction, hemorrhage, hydrocephalus, extra-axial collection or mass lesion/mass effect. Vascular: No hyperdense vessel. Skull: No acute fracture. Sinuses/Orbits: Clear sinuses.  No acute orbital findings. Other: No mastoid effusions. IMPRESSION: No evidence of acute intracranial abnormality. Electronically Signed   By: Feliberto Harts M.D.   On: 03/31/2023 18:26        Scheduled Meds:  atorvastatin  20 mg Oral Daily   DULoxetine  60 mg Oral Daily   enoxaparin (LOVENOX) injection  40 mg Subcutaneous QHS   folic acid  1 mg Oral Daily   gabapentin  600 mg Oral QHS   insulin aspart  0-15 Units Subcutaneous TID WC   insulin aspart  0-5 Units Subcutaneous QHS   insulin glargine-yfgn  20 Units Subcutaneous QHS   lactulose  20 g Oral  TID   levETIRAcetam  500 mg Oral BID   metoprolol succinate  12.5 mg Oral Daily   multivitamin with minerals  1 tablet Oral Daily   pantoprazole  40 mg Oral BID   [START ON 04/02/2023] pneumococcal 20-valent conjugate vaccine  0.5 mL Intramuscular Tomorrow-1000   potassium chloride  40 mEq Oral Once   thiamine  100 mg Oral Daily   Or   thiamine  100 mg Intravenous Daily   Continuous Infusions:   LOS: 0 days    Time spent: 45 minutes spent on chart review, discussion with nursing staff, consultants, updating family and interview/physical exam; more than 50% of that time was spent in counseling and/or coordination of care.    Joseph Art, DO [facility] Available via Epic secure chat 7am-7pm After these hours, please refer to coverage provider listed on [email or web address] 04/01/2023, 10:42 AM

## 2023-04-01 NOTE — TOC Initial Note (Signed)
Transition of Care [facility]) - Initial/Assessment Note    Patient Details  Name: Gary Mitchell MRN: [id number] Date of Birth: 09-20-1970  Transition of Care [facility]) CM/SW Contact:    Otelia Santee, LCSW Phone Number: 04/01/2023, 2:22 PM  Clinical Narrative:                 Met with pt to offer resources for alcohol use. Pt oriented to self and place only. Pt was not interactive in conversation. Pt denies resources being placed on AVS. TOC will follow and attempt to have further conversation with pt once fully alert and oriented.   Expected Discharge Plan: Home/Self Care Barriers to Discharge: Continued Medical Work up   Patient Goals and CMS Choice Patient states their goals for this hospitalization and ongoing recovery are:: Unable to assess CMS Medicare.gov Compare Post Acute Care list provided to:: Patient Choice offered to / list presented to : Patient [place] ownership interest in [facility].provided to:: Patient    Expected Discharge Plan and Services In-house Referral: Clinical Social Work Discharge Planning Services: NA Post Acute Care Choice: NA Living arrangements for the past 2 months: Mobile Home                 DME Arranged: N/A DME Agency: NA                  Prior Living Arrangements/Services Living arrangements for the past 2 months: Mobile Home Lives with:: Parents Patient language and need for interpreter reviewed:: Yes Do you feel safe going back to the place where you live?: Yes      Need for Family Participation in Patient Care: Yes (Comment) Care giver support system in place?: No (comment)   Criminal Activity/Legal Involvement Pertinent to Current Situation/Hospitalization: No - Comment as needed  Activities of Daily Living Home Assistive Devices/Equipment: None ADL Screening (condition at time of admission) Is the patient deaf or have difficulty hearing?: No Does the patient have difficulty seeing, even when wearing  glasses/contacts?: No Does the patient have difficulty concentrating, remembering, or making decisions?: Yes  Permission Sought/Granted   Permission granted to share information with : No              Emotional Assessment Appearance:: Appears stated age Attitude/Demeanor/Rapport: Inconsistent Affect (typically observed): Blunt Orientation: : Oriented to Self, Oriented to Place Alcohol / Substance Use: Alcohol Use Psych Involvement: No (comment)  Admission diagnosis:  Hepatic encephalopathy (HCC) [K76.82] Hypomagnesemia [E83.42] Confusion [R41.0] Encephalopathy [G93.40] Patient Active Problem List   Diagnosis Date Noted   Encephalopathy 03/31/2023   Encounter for medication review and counseling 11/13/2022   Paroxysmal atrial fibrillation (HCC) 08/08/2022   Hypercoagulable state due to paroxysmal atrial fibrillation (HCC) 08/08/2022   Malnutrition of moderate degree (HCC) 05/08/2022   Gastrointestinal hemorrhage    Benign neoplasm of descending colon    HCAP (healthcare-associated pneumonia) 05/06/2022   Symptomatic anemia 05/05/2022   Hyperlipidemia 05/05/2022   Hyperammonemia (HCC) 05/03/2022   Type 2 diabetes mellitus (HCC) 05/29/2021   Portal hypertensive gastropathy (HCC)    Esophageal varices without bleeding (HCC)    Acute blood loss anemia    Seizure disorder (HCC) 09/17/2020   Alcoholic cirrhosis of liver without ascites (HCC) 09/17/2020   Acute hepatic encephalopathy (HCC) 09/17/2020   Hypomagnesemia 09/17/2020   Suspected CVA (cerebral vascular accident) (HCC) 09/17/2020   Hypokalemia 09/17/2020   Seizures (HCC) 07/09/2020   High bilirubin 07/09/2020   Anemia 07/09/2020   Thrombocytopenia (HCC) 07/09/2020  COVID-19 virus infection 07/09/2020   GERD (gastroesophageal reflux disease) 07/09/2020   Pancreatic mass 11/29/2019   Acute pancreatitis 11/23/2019   Alcoholic hepatitis 11/23/2019   Gallstones 11/23/2019   Alcohol abuse 11/23/2019   PCP:   Hoy Register, MD Pharmacy:   [facility] [address] [place] [address] Phone: [phone] Fax: [phone]  [id number] [place], [place] - [address] [place] [address] Phone: [phone] Fax: [phone]  [facility] [address] [place] [address] Phone: [phone] Fax: [phone]  [facility] [address] [place] [address] Phone: [phone] Fax: [phone]  [facility] [address] [place] - [address] [place] [address] Phone: [phone] Fax: [phone]     Social Determinants of Health (SDOH) Social History: SDOH Screenings   Food Insecurity: No Food Insecurity (10/07/2022)  Housing: Patient Unable To Answer (10/07/2022)  Transportation Needs: No Transportation Needs (10/07/2022)  Utilities: Not At Risk (10/07/2022)  Depression (PHQ2-9): High Risk (01/22/2023)  Tobacco Use: Medium Risk (03/31/2023)   SDOH Interventions:     Readmission Risk Interventions     No data to display

## 2023-04-02 ENCOUNTER — Other Ambulatory Visit (HOSPITAL_COMMUNITY): Payer: Self-pay

## 2023-04-02 DIAGNOSIS — E7220 Disorder of urea cycle metabolism, unspecified: Secondary | ICD-10-CM | POA: Diagnosis not present

## 2023-04-02 DIAGNOSIS — F1010 Alcohol abuse, uncomplicated: Secondary | ICD-10-CM | POA: Diagnosis not present

## 2023-04-02 DIAGNOSIS — G9341 Metabolic encephalopathy: Secondary | ICD-10-CM

## 2023-04-02 DIAGNOSIS — E1169 Type 2 diabetes mellitus with other specified complication: Secondary | ICD-10-CM

## 2023-04-02 DIAGNOSIS — R569 Unspecified convulsions: Secondary | ICD-10-CM

## 2023-04-02 LAB — BASIC METABOLIC PANEL
Anion gap: 7 (ref 5–15)
BUN: 6 mg/dL (ref 6–20)
CO2: 22 mmol/L (ref 22–32)
Calcium: 9.3 mg/dL (ref 8.9–10.3)
Chloride: 102 mmol/L (ref 98–111)
Creatinine, Ser: 0.47 mg/dL — ABNORMAL LOW (ref 0.61–1.24)
GFR, Estimated: >60 mL/min (ref >60)
Glucose, Bld: 171 mg/dL — ABNORMAL HIGH (ref 70–99)
Potassium: 4 mmol/L (ref 3.5–5.1)
Sodium: 131 mmol/L — ABNORMAL LOW (ref 135–145)

## 2023-04-02 LAB — CBC
HCT: 35.3 % — ABNORMAL LOW (ref 39.0–52.0)
Hemoglobin: 11.5 g/dL — ABNORMAL LOW (ref 13.0–17.0)
MCH: 25.5 pg — ABNORMAL LOW (ref 26.0–34.0)
MCHC: 32.6 g/dL (ref 30.0–36.0)
MCV: 78.3 fL — ABNORMAL LOW (ref 80.0–100.0)
Platelets: 133 10*3/uL — ABNORMAL LOW (ref 150–400)
RBC: 4.51 MIL/uL (ref 4.22–5.81)
RDW: 24.3 % — ABNORMAL HIGH (ref 11.5–15.5)
WBC: 6.2 10*3/uL (ref 4.0–10.5)
nRBC: 0 % (ref 0.0–0.2)

## 2023-04-02 LAB — GLUCOSE, CAPILLARY
Glucose-Capillary: 178 mg/dL — ABNORMAL HIGH (ref 70–99)
Glucose-Capillary: 189 mg/dL — ABNORMAL HIGH (ref 70–99)

## 2023-04-02 LAB — LACTIC ACID, PLASMA: Lactic Acid, Venous: 1.4 mmol/L (ref 0.5–1.9)

## 2023-04-02 MED ORDER — INSULIN LISPRO (1 UNIT DIAL) 100 UNIT/ML (KWIKPEN)
5.0000 [IU] | PEN_INJECTOR | Freq: Three times a day (TID) | SUBCUTANEOUS | 0 refills | Status: DC
  Filled 2023-04-02: qty 12, 80d supply, fill #0

## 2023-04-02 MED ORDER — TRUEPLUS 5-BEVEL PEN NEEDLES 32G X 4 MM MISC
0 refills | Status: DC
  Filled 2023-04-02: qty 100, fill #0

## 2023-04-02 MED ORDER — ATORVASTATIN CALCIUM 20 MG PO TABS
20.0000 mg | ORAL_TABLET | Freq: Every day | ORAL | 3 refills | Status: DC
  Filled 2023-04-02: qty 90, 90d supply, fill #0
  Filled 2023-08-13: qty 90, 90d supply, fill #1
  Filled 2023-11-06: qty 90, 90d supply, fill #0

## 2023-04-02 MED ORDER — METOPROLOL SUCCINATE ER 25 MG PO TB24
12.5000 mg | ORAL_TABLET | Freq: Every day | ORAL | 0 refills | Status: DC
Start: 2023-04-02 — End: 2023-05-13
  Filled 2023-04-02: qty 45, 90d supply, fill #0

## 2023-04-02 MED ORDER — FUROSEMIDE 20 MG PO TABS
20.0000 mg | ORAL_TABLET | Freq: Every day | ORAL | 0 refills | Status: DC | PRN
  Filled 2023-04-02: qty 30, 30d supply, fill #0

## 2023-04-02 MED ORDER — PANTOPRAZOLE SODIUM 40 MG PO TBEC
40.0000 mg | DELAYED_RELEASE_TABLET | Freq: Two times a day (BID) | ORAL | 0 refills | Status: DC
  Filled 2023-04-02: qty 180, 90d supply, fill #0

## 2023-04-02 MED ORDER — LACTULOSE 10 GM/15ML PO SOLN
20.0000 g | Freq: Three times a day (TID) | ORAL | 0 refills | Status: DC
  Filled 2023-04-02: qty 946, 11d supply, fill #0

## 2023-04-02 MED ORDER — METFORMIN HCL 500 MG PO TABS
1000.0000 mg | ORAL_TABLET | Freq: Two times a day (BID) | ORAL | 0 refills | Status: DC
  Filled 2023-04-02: qty 360, 90d supply, fill #0

## 2023-04-02 MED ORDER — ACCU-CHEK SOFTCLIX LANCETS MISC
0 refills | Status: DC
Start: 2023-04-02 — End: 2023-12-30
  Filled 2023-04-02: qty 100, 90d supply, fill #0

## 2023-04-02 MED ORDER — VITAMIN B-1 100 MG PO TABS
100.0000 mg | ORAL_TABLET | Freq: Every day | ORAL | 0 refills | Status: AC
  Filled 2023-04-02: qty 90, 90d supply, fill #0

## 2023-04-02 MED ORDER — FOLIC ACID 1 MG PO TABS
1.0000 mg | ORAL_TABLET | Freq: Every day | ORAL | 0 refills | Status: AC
  Filled 2023-04-02: qty 90, 90d supply, fill #0

## 2023-04-02 MED ORDER — DULOXETINE HCL 60 MG PO CPEP
60.0000 mg | ORAL_CAPSULE | Freq: Every day | ORAL | 0 refills | Status: DC
Start: 2023-04-02 — End: 2023-05-13
  Filled 2023-04-02: qty 90, 90d supply, fill #0

## 2023-04-02 MED ORDER — LEVETIRACETAM 500 MG PO TABS
500.0000 mg | ORAL_TABLET | Freq: Two times a day (BID) | ORAL | 0 refills | Status: DC
  Filled 2023-04-02: qty 180, 90d supply, fill #0

## 2023-04-02 MED ORDER — LANTUS SOLOSTAR 100 UNIT/ML ~~LOC~~ SOPN
20.0000 [IU] | PEN_INJECTOR | Freq: Every day | SUBCUTANEOUS | 0 refills | Status: DC
  Filled 2023-04-02: qty 15, 75d supply, fill #0
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
## 2023-04-02 NOTE — Assessment & Plan Note (Signed)
Had elevated NH3 of 99 on admission. Started on lactulose. Mentation has improved.
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
## 2023-04-02 NOTE — Plan of Care (Signed)
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
## 2023-04-02 NOTE — Assessment & Plan Note (Signed)
Remain on lantus and SSI. Restart metformin as outpatient.
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
## 2023-04-02 NOTE — Assessment & Plan Note (Signed)
Stable. Back on keppra. No seizures notes.

## 2023-04-03 ENCOUNTER — Telehealth (INDEPENDENT_AMBULATORY_CARE_PROVIDER_SITE_OTHER): Payer: Self-pay | Admitting: Family Medicine

## 2023-04-03 ENCOUNTER — Other Ambulatory Visit: Payer: Self-pay

## 2023-04-03 ENCOUNTER — Other Ambulatory Visit (HOSPITAL_COMMUNITY): Payer: Self-pay

## 2023-04-03 ENCOUNTER — Telehealth: Payer: Self-pay

## 2023-04-03 ENCOUNTER — Other Ambulatory Visit: Payer: Self-pay | Admitting: Family Medicine

## 2023-04-03 DIAGNOSIS — E1169 Type 2 diabetes mellitus with other specified complication: Secondary | ICD-10-CM

## 2023-04-03 NOTE — Transitions of Care (Post Inpatient/ED Visit) (Signed)
04/03/2023  Name: Gary Mitchell MRN: [id number] DOB: [birth year]  Today's TOC FU Call Status: Today's TOC FU Call Status:: Unsuccessful Call (1st Attempt) Unsuccessful Call (1st Attempt) Date: 04/03/23  Attempted to reach the patient regarding the most recent Inpatient/ED visit.  Follow Up Plan: Additional outreach attempts will be made to reach the patient to complete the Transitions of Care (Post Inpatient/ED visit) call.   Signature Robyne Peers, RN
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
## 2023-04-03 NOTE — Telephone Encounter (Signed)
Copied from CRM [id number]. Topic: General - Other >> Apr 03, 2023 11:30 AM Franchot Heidelberg wrote: Reason for CRM: Pt called requesting to speak to [facility] contact: [phone]

## 2023-04-03 NOTE — Telephone Encounter (Signed)
Call returned to patient. Requesting Libre sensors refilled. Rxns refilled.

## 2023-04-07 ENCOUNTER — Encounter: Payer: Self-pay | Admitting: Pharmacist

## 2023-04-07 ENCOUNTER — Telehealth: Payer: Self-pay

## 2023-04-07 ENCOUNTER — Ambulatory Visit: Payer: 59 | Attending: Nurse Practitioner | Admitting: Pharmacist

## 2023-04-07 ENCOUNTER — Other Ambulatory Visit: Payer: Self-pay

## 2023-04-07 DIAGNOSIS — Z7984 Long term (current) use of oral hypoglycemic drugs: Secondary | ICD-10-CM | POA: Diagnosis not present

## 2023-04-07 DIAGNOSIS — Z794 Long term (current) use of insulin: Secondary | ICD-10-CM

## 2023-04-07 DIAGNOSIS — E1169 Type 2 diabetes mellitus with other specified complication: Secondary | ICD-10-CM

## 2023-04-07 MED ORDER — LANTUS SOLOSTAR 100 UNIT/ML ~~LOC~~ SOPN
20.0000 [IU] | PEN_INJECTOR | Freq: Every day | SUBCUTANEOUS | 0 refills | Status: DC
  Filled 2023-04-07 (×2): qty 15, 75d supply, fill #0

## 2023-04-07 MED ORDER — INSULIN LISPRO (1 UNIT DIAL) 100 UNIT/ML (KWIKPEN)
5.0000 [IU] | PEN_INJECTOR | Freq: Three times a day (TID) | SUBCUTANEOUS | 0 refills | Status: DC
  Filled 2023-04-07 (×2): qty 15, 100d supply, fill #0

## 2023-04-07 NOTE — Transitions of Care (Post Inpatient/ED Visit) (Signed)
04/07/2023  Name: Gary Mitchell MRN: [id number] DOB: July 22, 1970  Today's TOC FU Call Status: Today's TOC FU Call Status:: Unsuccessful Call (2nd Attempt) Unsuccessful Call (1st Attempt) Date: 04/03/23 Unsuccessful Call (2nd Attempt) Date: 04/07/23  Attempted to reach the patient regarding the most recent Inpatient/ED visit.  Follow Up Plan: Additional outreach attempts will be made to reach the patient to complete the Transitions of Care (Post Inpatient/ED visit) call.   Signature  Robyne Peers, RN
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
## 2023-04-07 NOTE — Progress Notes (Signed)
S:    52 y.o. male who presents for diabetes evaluation, education, and management. PMH is significant for T2DM, hx of a fib w/ prior CVA, hx of acute pancreatitis secondary to alcohol use, GERD, alcoholic cirrhosis of the liver, esophageal varies. No known CAD, ACS, or other clinical ASCVD hx. No CHF or CKD hx.   Patient was referred and last seen by Primary Care Provider, Dr. Alvis Lemmings, on 01/22/2023. He was originally referred for our LIBERATE study but he does not have a compatible phone. Last seen by pharmacist on 03/04/2023. At last visit, glipizide was discontinued, metformin was continued, Lantus increased from 10u to 20u daily, and began Humalog 5u TID. Last A1c was 9.3% on 9/23 during hospital admission. Of note, patient was recently admitted to ED for acute metabolic encephalopathy.   Patient arrives in okay spirits and presents without any assistance. Patient is accompanied by mother.   Family/Social History:  Fhx: DM Tobacco: former smoker (quit in 2019) Alcohol: no current use reported and patient denies    Current diabetes medications include: Lantus 20 units daily, metformin 500 mg tabs - takes 2 tablets (1000mg ) BID, Humalog 5 units daily - reports taking Humalog 5 units once daily; taking all other medications as listed above Current hypertension medications include: Toprol XL 12.5 mg daily Current hyperlipidemia medications include: atorvastatin 20 mg   Patient reports adherence to taking all medications as prescribed.   Insurance coverage: Managed Medicaid   Patient denies hypoglycemic events.  Patient denies nocturia (nighttime urination).  Patient denies neuropathy (nerve pain). Patient denies visual changes. Patient reports self foot exams.   Patient reported dietary habits: Eats 2-3 meals/day - Doesn't eat a lot of meat - Eats salads, cucumbers, fruits  - Does not drink a lot of water  Patient-reported exercise habits: none   O:  Libre2 CGM Meter Report  from last 7 days % Time CGM is active: 6 scans per day and 45% usage Average Glucose: 297 mg/dL Time in Goal:  - Time in range 70-180: 14% - Time above range: 86% - Time below range: 0% Observed patterns:  highest from 6 pm-6 am (6pm -6am: 330-340s) 6am-6pm: 240-260s   Lab Results  Component Value Date   HGBA1C 8.6 (H) 03/31/2023   There were no vitals filed for this visit.  Lipid Panel     Component Value Date/Time   TRIG 120 07/09/2020 1240    Clinical Atherosclerotic Cardiovascular Disease (ASCVD): Yes  The ASCVD Risk score (Arnett DK, et al., 2019) failed to calculate for the following reasons:   The patient has a prior MI or stroke diagnosis   Patient is participating in a Managed Medicaid Plan: Yes   A/P: Diabetes longstanding, currently uncontrolled. Patient is able to verbalize appropriate hypoglycemia management plan. Medication adherence appears okay. Control is suboptimal due to . -Continue Lantus 20 units in AM, metformin 500 mg tabs - takes 2 tablets (1000mg ) BID -Begin taking Humalog 5 units TID - before meals -Patient educated on purpose, proper use, and potential adverse effects of medications.  -Extensively discussed pathophysiology of diabetes, recommended lifestyle interventions, dietary effects on blood sugar control.  -Counseled on s/sx of and management of hypoglycemia.  -Next A1c anticipated 04/2023.   ASCVD risk - secondary prevention in patient with diabetes. Last LDL is unknown since lipid panel is not on file.  -Continue atorvastatin 20 mg daily  -Obtain lipid panel at next visit   Written patient instructions provided. Patient verbalized understanding of treatment  plan.  Total time in face to face counseling 30 minutes.    Follow-up:  Pharmacist on 06/09/2023 PCP clinic visit on 04/28/2023

## 2023-04-08 DIAGNOSIS — Z419 Encounter for procedure for purposes other than remedying health state, unspecified: Secondary | ICD-10-CM | POA: Diagnosis not present

## 2023-04-11 ENCOUNTER — Other Ambulatory Visit: Payer: 59

## 2023-04-11 NOTE — Patient Instructions (Signed)
Visit Information  Gary Mitchell was given information about Medicaid Managed Care team care coordination services as a part of their [facility] Medicaid benefit. Gary Mitchell verbally consented to engagement with the [facility] Managed Care team.   If you are experiencing a medical emergency, please call 911 or report to your local emergency department or urgent care.   If you have a non-emergency medical problem during routine business hours, please contact your provider's office and ask to speak with a nurse.   For questions related to your [facility] health plan, please call: [phone] or go here:[email or web address]  If you would like to schedule transportation through your [facility] plan, please call the following number at least 2 days in advance of your appointment: [phone].   You can also use the MTM portal or MTM mobile app to manage your rides. Reimbursement for transportation is available through [facility]! For the portal, please go to [email or web address]  Call the [facility] Crisis Line at [phone], at any time, 24 hours a day, 7 days a week. If you are in danger or need immediate medical attention call 911.  If you would like help to quit smoking, call 1-800-QUIT-NOW (2527607670) OR Espaol: 1-855-Djelo-Ya (6-440-347-4259) o para ms informacin haga clic aqu or Text READY to 563-875 to register via text  Gary Mitchell - following are the goals we discussed in your visit today:   Goals Addressed   None      Social Worker will follow up on 04/18/23.   Gary Mitchell, Gary Mitchell, MHA [facility]  Managed Medicaid Social Worker [phone]   Following is a copy of your plan of care:  There are no care plans that you recently modified to display for this patient.
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
## 2023-04-18 ENCOUNTER — Other Ambulatory Visit: Payer: 59

## 2023-04-18 NOTE — Patient Outreach (Signed)
Medicaid Managed Care Social Work Note  04/18/2023 Name:  Gary Mitchell MRN:  [id number] DOB:  08/22/1970  Gary Mitchell is an 52 y.o. year old male who is a primary patient of Hoy Register, MD.  The Medicaid Managed Care Coordination team was consulted for assistance with:  Food Insecurity Housing   Gary Mitchell was given information about Medicaid Managed Care Coordination team services today. Gary Mitchell Patient agreed to services and verbal consent obtained.  Engaged with patient  for by telephone forfollow up visit in response to referral for case management and/or care coordination services.   Assessments/Interventions:  Review of past medical history, allergies, medications, health status, including review of consultants reports, laboratory and other test data, was performed as part of comprehensive evaluation and provision of chronic care management services.  SDOH: (Social Determinant of Health) assessments and interventions performed: SDOH Interventions    Flowsheet Row Office Visit from 10/16/2022 in [facility]  SDOH Interventions   Depression Interventions/Treatment  Medication, Currently on Treatment     BSW completed a telephone outreach with patient, he states they did receive the resources BSW sent and none of them where able to assist. Patient states they are able to stay in the hotel until Wed or Thursday. Patient states they may need some food resources. BSW sent email with food pantries.  Advanced Directives Status:  Not ready or willing to discuss.  Care Plan                 Allergies  Allergen Reactions   Iohexol Other (See Comments)    Unknown reaction at 85 days old Mom at bedside reported patient was given injections of iohexol in foot and resulted in "hole in foot or foot got infected"    Medications Reviewed Today   Medications were not reviewed in this encounter     Patient Active Problem List   Diagnosis  Date Noted   Acute metabolic encephalopathy 03/31/2023   Encounter for medication review and counseling 11/13/2022   Paroxysmal atrial fibrillation (HCC) 08/08/2022   Hypercoagulable state due to paroxysmal atrial fibrillation (HCC) 08/08/2022   Malnutrition of moderate degree (HCC) 05/08/2022   Benign neoplasm of descending colon    Symptomatic anemia 05/05/2022   Hyperlipidemia 05/05/2022   Hyperammonemia (HCC) 05/03/2022   Type 2 diabetes mellitus (HCC) 05/29/2021   Portal hypertensive gastropathy (HCC)    Esophageal varices without bleeding (HCC)    Seizure disorder (HCC) 09/17/2020   Alcoholic cirrhosis of liver without ascites (HCC) 09/17/2020   Acute hepatic encephalopathy (HCC) 09/17/2020   Suspected CVA (cerebral vascular accident) (HCC) 09/17/2020   Seizures (HCC) 07/09/2020   High bilirubin 07/09/2020   Anemia 07/09/2020   Thrombocytopenia (HCC) 07/09/2020   COVID-19 virus infection 07/09/2020   GERD (gastroesophageal reflux disease) 07/09/2020   Pancreatic mass 11/29/2019   Alcoholic hepatitis 11/23/2019   Gallstones 11/23/2019   Alcohol abuse 11/23/2019    Conditions to be addressed/monitored per PCP order:   food and housing  There are no care plans that you recently modified to display for this patient.   Follow up:  Patient agrees to Care Plan and Follow-up.  Plan: The Managed Medicaid care management team will reach out to the patient again over the next 30 days.  Date/time of next scheduled Social Work care management/care coordination outreach:  05/20/23 Gus Puma, Kenard Gower, [facility]  Managed [facility] Social Worker [phone]

## 2023-04-18 NOTE — Patient Instructions (Signed)
Visit Information  Mr. Lezcano was given information about Medicaid Managed Care team care coordination services as a part of their [facility] Medicaid benefit. [name] verbally consented to engagement with the [facility] Managed Care team.   If you are experiencing a medical emergency, please call 911 or report to your local emergency department or urgent care.   If you have a non-emergency medical problem during routine business hours, please contact your provider's office and ask to speak with a nurse.   For questions related to your [facility] health plan, please call: [phone] or go here:[email or web address]  If you would like to schedule transportation through your [facility] plan, please call the following number at least 2 days in advance of your appointment: [phone].   You can also use the MTM portal or MTM mobile app to manage your rides. Reimbursement for transportation is available through [facility]! For the portal, please go to [email or web address]  Call the [facility] Crisis Line at [phone], at any time, 24 hours a day, 7 days a week. If you are in danger or need immediate medical attention call 911.  If you would like help to quit smoking, call 1-800-QUIT-NOW ((936)239-3775) OR Espaol: 1-855-Djelo-Ya (0-347-425-9563) o para ms informacin haga clic aqu or Text READY to 875-643 to register via text  [name] - following are the goals we discussed in your visit today:   Goals Addressed   None      Social Worker will follow up in 30 days.   Gus Puma, Kenard Gower, MHA [facility] Health  Managed Medicaid Social Worker [phone]   Following is a copy of your plan of care:  There are no care plans that you recently modified to display for this patient.

## 2023-04-25 ENCOUNTER — Other Ambulatory Visit: Payer: Self-pay

## 2023-04-25 ENCOUNTER — Encounter: Payer: Self-pay | Admitting: Gastroenterology

## 2023-04-25 ENCOUNTER — Other Ambulatory Visit (INDEPENDENT_AMBULATORY_CARE_PROVIDER_SITE_OTHER): Payer: 59

## 2023-04-25 ENCOUNTER — Ambulatory Visit (INDEPENDENT_AMBULATORY_CARE_PROVIDER_SITE_OTHER): Payer: 59 | Admitting: Gastroenterology

## 2023-04-25 VITALS — BP 100/70 | HR 76 | Ht 64.5 in | Wt 135.5 lb

## 2023-04-25 DIAGNOSIS — D509 Iron deficiency anemia, unspecified: Secondary | ICD-10-CM

## 2023-04-25 DIAGNOSIS — F1090 Alcohol use, unspecified, uncomplicated: Secondary | ICD-10-CM | POA: Diagnosis not present

## 2023-04-25 DIAGNOSIS — K7030 Alcoholic cirrhosis of liver without ascites: Secondary | ICD-10-CM

## 2023-04-25 DIAGNOSIS — K862 Cyst of pancreas: Secondary | ICD-10-CM

## 2023-04-25 DIAGNOSIS — E561 Deficiency of vitamin K: Secondary | ICD-10-CM

## 2023-04-25 DIAGNOSIS — E876 Hypokalemia: Secondary | ICD-10-CM

## 2023-04-25 LAB — COMPREHENSIVE METABOLIC PANEL
ALT: 22 U/L (ref 0–53)
AST: 55 U/L — ABNORMAL HIGH (ref 0–37)
Albumin: 3.9 g/dL (ref 3.5–5.2)
Alkaline Phosphatase: 184 U/L — ABNORMAL HIGH (ref 39–117)
BUN: 6 mg/dL (ref 6–23)
CO2: 29 meq/L (ref 19–32)
Calcium: 9.7 mg/dL (ref 8.4–10.5)
Chloride: 93 meq/L — ABNORMAL LOW (ref 96–112)
Creatinine, Ser: 0.71 mg/dL (ref 0.40–1.50)
GFR: 107.04 mL/min (ref >60.00)
Glucose, Bld: 234 mg/dL — ABNORMAL HIGH (ref 70–99)
Potassium: 2.8 meq/L — CL (ref 3.5–5.1)
Sodium: 135 meq/L (ref 135–145)
Total Bilirubin: 3.9 mg/dL — ABNORMAL HIGH (ref 0.2–1.2)
Total Protein: 8.8 g/dL — ABNORMAL HIGH (ref 6.0–8.3)

## 2023-04-25 LAB — CBC
HCT: 38 % — ABNORMAL LOW (ref 39.0–52.0)
Hemoglobin: 12.1 g/dL — ABNORMAL LOW (ref 13.0–17.0)
MCHC: 31.9 g/dL (ref 30.0–36.0)
MCV: 80.4 fL (ref 78.0–100.0)
Platelets: 93 10*3/uL — ABNORMAL LOW (ref 150.0–400.0)
RBC: 4.73 Mil/uL (ref 4.22–5.81)
RDW: 25.9 % — ABNORMAL HIGH (ref 11.5–15.5)
WBC: 6.2 10*3/uL (ref 4.0–10.5)

## 2023-04-25 LAB — VITAMIN B12: Vitamin B-12: 456 pg/mL (ref 211–911)

## 2023-04-25 LAB — PROTIME-INR
INR: 1.8 {ratio} — ABNORMAL HIGH (ref 0.8–1.0)
Prothrombin Time: 18.5 s — ABNORMAL HIGH (ref 9.6–13.1)

## 2023-04-25 LAB — IBC + FERRITIN
Ferritin: 16 ng/mL — ABNORMAL LOW (ref 22.0–322.0)
Iron: 309 ug/dL — ABNORMAL HIGH (ref 42–165)
Saturation Ratios: 69.8 % — ABNORMAL HIGH (ref 20.0–50.0)
TIBC: 442.4 ug/dL (ref 250.0–450.0)
Transferrin: 316 mg/dL (ref 212.0–360.0)

## 2023-04-25 LAB — FOLATE: Folate: >24.2 ng/mL (ref >5.9)

## 2023-04-25 MED ORDER — PHYTONADIONE 5 MG PO TABS
10.0000 mg | ORAL_TABLET | Freq: Every day | ORAL | 0 refills | Status: AC
Start: 2023-04-25 — End: 2023-05-14
  Filled 2023-04-25: qty 30, 15d supply, fill #0

## 2023-04-25 MED ORDER — PREDNISONE 50 MG PO TABS
ORAL_TABLET | ORAL | 0 refills | Status: DC
  Filled 2023-04-25: qty 3, 1d supply, fill #0

## 2023-04-25 MED ORDER — POTASSIUM CHLORIDE CRYS ER 20 MEQ PO TBCR
40.0000 meq | EXTENDED_RELEASE_TABLET | Freq: Every day | ORAL | 0 refills | Status: DC
  Filled 2023-04-25: qty 20, 10d supply, fill #0
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
## 2023-04-25 NOTE — Patient Instructions (Signed)
Your provider has requested that you go to the basement level for lab work before leaving today. Press "B" on the elevator. The lab is located at the first door on the left as you exit the elevator.  We have sent the following medications to your pharmacy for you to pick up at your convenience: Prednisone   Since you are allergic to one or more components in IV contrast, we have sent a prescription for (3) 50 mg tablets of Prednisone to your pharmacy as a Pre-Medication Prep for your upcoming procedure requiring contrast.    Take (1) 50 mg tablet of prednisone 13 hours prior to your procedure at 8:30 pm.   Take (1) 50 mg tablet 7 hours prior to your procedure at 3:30 am.    Take (1) 50 mg tablet 1 hour prior to your procedure at 8:30 am .    You also need to take 50 mg of Benadryl 1 hour prior to your procedure at 8:30 am .  If you have 25 mg tablets of Benadryl, which can be purchased over the counter, you may take 2 tablets.  Otherwise we can send a prescription for (1) 50 mg tablet of Benadryl to your pharmacy.    You have been scheduled for a CT scan of the abdomen and pelvis at [facility], 1st floor Radiology. You are scheduled on 05/02/23 at 9:30 am . You should arrive 15 minutes prior to your appointment time for registration.   Please follow the written instructions below on the day of your exam:   1) Do not eat anything after 5:30 am 8 (4 hours prior to your test)    You may take any medications as prescribed with a small amount of water, if necessary. If you take any of the following medications: METFORMIN, GLUCOPHAGE, GLUCOVANCE, AVANDAMET, RIOMET, FORTAMET, ACTOPLUS MET, JANUMET, GLUMETZA or METAGLIP, you MAY be asked to HOLD this medication 48 hours AFTER the exam.   The purpose of you drinking the oral contrast is to aid in the visualization of your intestinal tract. The contrast solution may cause some diarrhea. Depending on your individual set of symptoms, you may also  receive an intravenous injection of x-ray contrast/dye. Plan on being at [facility] for 45 minutes or longer, depending on the type of exam you are having performed.   If you have any questions regarding your exam or if you need to reschedule, you may call [facility] at [phone] between the hours of 8:00 am and 5:00 pm, Monday-Friday.   Due to recent changes in healthcare laws, you may see the results of your imaging and laboratory studies on MyChart before your provider has had a chance to review them.  We understand that in some cases there may be results that are confusing or concerning to you. Not all laboratory results come back in the same time frame and the provider may be waiting for multiple results in order to interpret others.  Please give [place] 48 hours in order for your provider to thoroughly review all the results before contacting the office for clarification of your results.   Thank you for choosing me and [facility].  Dr. Meridee Score

## 2023-04-28 ENCOUNTER — Ambulatory Visit: Payer: 59 | Admitting: Family Medicine

## 2023-04-28 ENCOUNTER — Other Ambulatory Visit (INDEPENDENT_AMBULATORY_CARE_PROVIDER_SITE_OTHER): Payer: 59

## 2023-04-28 ENCOUNTER — Other Ambulatory Visit: Payer: Self-pay

## 2023-04-28 DIAGNOSIS — E876 Hypokalemia: Secondary | ICD-10-CM

## 2023-04-28 DIAGNOSIS — E561 Deficiency of vitamin K: Secondary | ICD-10-CM

## 2023-04-28 LAB — BASIC METABOLIC PANEL
BUN: 7 mg/dL (ref 6–23)
CO2: 26 meq/L (ref 19–32)
Calcium: 9.4 mg/dL (ref 8.4–10.5)
Chloride: 96 meq/L (ref 96–112)
Creatinine, Ser: 0.77 mg/dL (ref 0.40–1.50)
GFR: 104.44 mL/min (ref >60.00)
Glucose, Bld: 451 mg/dL — ABNORMAL HIGH (ref 70–99)
Potassium: 4.1 meq/L (ref 3.5–5.1)
Sodium: 131 meq/L — ABNORMAL LOW (ref 135–145)

## 2023-04-28 LAB — PROTIME-INR
INR: 1.7 {ratio} — ABNORMAL HIGH (ref 0.8–1.0)
Prothrombin Time: 17.8 s — ABNORMAL HIGH (ref 9.6–13.1)

## 2023-04-29 ENCOUNTER — Other Ambulatory Visit: Payer: Self-pay

## 2023-04-29 ENCOUNTER — Other Ambulatory Visit: Payer: Self-pay | Admitting: Family Medicine

## 2023-04-29 ENCOUNTER — Telehealth: Payer: Self-pay | Admitting: Gastroenterology

## 2023-04-29 MED ORDER — POTASSIUM CHLORIDE CRYS ER 20 MEQ PO TBCR
40.0000 meq | EXTENDED_RELEASE_TABLET | Freq: Every day | ORAL | 0 refills | Status: DC
  Filled 2023-04-29 – 2023-05-01 (×2): qty 60, 30d supply, fill #0

## 2023-04-29 NOTE — Telephone Encounter (Signed)
Pharmacy has been called and notified that # [phone] is ok to fill. No further questions or concerns.

## 2023-04-29 NOTE — Telephone Encounter (Signed)
Inbound call from [facility] pharmacy stating Vitamin K comes with 30 tablets. Requesting to know if it okay to give patient 30 tablets. Requesting for a call back at [phone] ask for Shayla. Please advise, thank you.

## 2023-04-29 NOTE — Telephone Encounter (Signed)
Perfect. Thanks. GM 

## 2023-04-29 NOTE — Telephone Encounter (Signed)
Dr Meridee Score ok to give  #30 ?

## 2023-04-30 ENCOUNTER — Other Ambulatory Visit: Payer: Self-pay

## 2023-04-30 ENCOUNTER — Encounter: Payer: Self-pay | Admitting: Gastroenterology

## 2023-04-30 DIAGNOSIS — K862 Cyst of pancreas: Secondary | ICD-10-CM | POA: Insufficient documentation

## 2023-04-30 DIAGNOSIS — F1090 Alcohol use, unspecified, uncomplicated: Secondary | ICD-10-CM | POA: Insufficient documentation

## 2023-04-30 DIAGNOSIS — D509 Iron deficiency anemia, unspecified: Secondary | ICD-10-CM | POA: Insufficient documentation

## 2023-05-01 ENCOUNTER — Other Ambulatory Visit: Payer: Self-pay

## 2023-05-02 ENCOUNTER — Ambulatory Visit (HOSPITAL_COMMUNITY)
Admission: RE | Admit: 2023-05-02 | Discharge: 2023-05-02 | Disposition: A | Discharge status: Home / Self Care | Payer: 59 | Source: Ambulatory Visit | Attending: Gastroenterology | Admitting: Gastroenterology

## 2023-05-02 DIAGNOSIS — D509 Iron deficiency anemia, unspecified: Secondary | ICD-10-CM | POA: Diagnosis present

## 2023-05-02 DIAGNOSIS — K862 Cyst of pancreas: Secondary | ICD-10-CM | POA: Diagnosis present

## 2023-05-02 DIAGNOSIS — K7030 Alcoholic cirrhosis of liver without ascites: Secondary | ICD-10-CM | POA: Insufficient documentation

## 2023-05-02 MED ORDER — IOHEXOL 300 MG/ML  SOLN
100.0000 mL | Freq: Once | INTRAMUSCULAR | Status: AC | PRN
  Administered 2023-05-02: 100 mL via INTRAVENOUS

## 2023-05-05 ENCOUNTER — Other Ambulatory Visit: Payer: Self-pay

## 2023-05-05 DIAGNOSIS — E60 Dietary zinc deficiency: Secondary | ICD-10-CM

## 2023-05-05 LAB — HEPATITIS B SURFACE ANTIBODY,QUALITATIVE: Hep B S Ab: NONREACTIVE

## 2023-05-05 LAB — ZINC: Zinc: 55 ug/dL — ABNORMAL LOW (ref 60–130)

## 2023-05-05 LAB — CANCER ANTIGEN 19-9: CA 19-9: 74 U/mL — ABNORMAL HIGH (ref <34)

## 2023-05-05 LAB — HEPATITIS B SURFACE ANTIGEN: Hepatitis B Surface Ag: NONREACTIVE

## 2023-05-05 LAB — HEPATITIS B CORE ANTIBODY, TOTAL: Hep B Core Total Ab: NONREACTIVE

## 2023-05-05 LAB — AFP TUMOR MARKER: AFP-Tumor Marker: 5.7 ng/mL (ref <6.1)

## 2023-05-05 LAB — COPPER, FREE: COPPER - FREE, SERUM/PLASMA: 580 ug/L

## 2023-05-05 LAB — HEPATITIS A ANTIBODY, TOTAL: Hepatitis A AB,Total: REACTIVE — AB

## 2023-05-05 MED ORDER — ZINC 220 (50 ZN) MG PO CAPS
1.0000 | ORAL_CAPSULE | Freq: Every day | ORAL | 3 refills | Status: DC
  Filled 2023-05-05: qty 30, 30d supply, fill #0

## 2023-05-06 ENCOUNTER — Ambulatory Visit: Payer: Self-pay

## 2023-05-06 ENCOUNTER — Other Ambulatory Visit: Payer: Self-pay

## 2023-05-06 NOTE — Telephone Encounter (Signed)
Chief Complaint: Medication Question  Symptoms: None  Disposition: [] ED /[] Urgent Care (no appt availability in office) / [] Appointment(In office/virtual)/ []  [facility]/ [x] Home Care/ [] Refused Recommended Disposition /[] [facility]/ []  Follow-up with PCP Additional Notes: Patient called to ask for the direction for the phytonadione 5 MG medication. Patient states he took the label off the bottle. Patient was given care advice and was given the direction as follows:  Take 2 tablets (10MG  total) by mouth daily for 14 doses. Patient verbalized understanding.   Summary: Medication direction questions   Pt has medication instruction questions about phytonadione (VITAMIN K) 5 MG tablet please advise, says he lost the bottle and does not remember how much he is supposed to take.  Best contact:  [phone]     Reason for Disposition  Caller has medicine question only, adult not sick, AND triager answers question  Answer Assessment - Initial Assessment Questions 1. NAME of MEDICINE: "What medicine(s) are you calling about?"    phytonadione (VITAMIN K) 5 MG   2. QUESTION: "What is your question?" (e.g., double dose of medicine, side effect)     What are the direction for the taking this medication   3. PRESCRIBER: "Who prescribed the medicine?" Reason: if prescribed by specialist, call should be referred to that group.     Prescribed by specialist 4. SYMPTOMS: "Do you have any symptoms?" If Yes, ask: "What symptoms are you having?"  "How bad are the symptoms (e.g., mild, moderate, severe)     None  Protocols used: Medication Question Call-A-AH

## 2023-05-07 NOTE — Telephone Encounter (Signed)
Medication prescribe by Dr. Meridee Score.   phytonadione (VITAMIN K) 5 MG tablet

## 2023-05-09 DIAGNOSIS — Z419 Encounter for procedure for purposes other than remedying health state, unspecified: Secondary | ICD-10-CM | POA: Diagnosis not present

## 2023-05-13 ENCOUNTER — Other Ambulatory Visit: Payer: Self-pay

## 2023-05-13 ENCOUNTER — Encounter: Payer: Self-pay | Admitting: Family Medicine

## 2023-05-13 ENCOUNTER — Ambulatory Visit: Payer: 59 | Attending: Family Medicine | Admitting: Family Medicine

## 2023-05-13 VITALS — BP 130/84 | HR 95 | Ht 64.5 in | Wt 135.0 lb

## 2023-05-13 DIAGNOSIS — K709 Alcoholic liver disease, unspecified: Secondary | ICD-10-CM

## 2023-05-13 DIAGNOSIS — Z59812 Housing instability, housed, homelessness in past 12 months: Secondary | ICD-10-CM

## 2023-05-13 DIAGNOSIS — E1169 Type 2 diabetes mellitus with other specified complication: Secondary | ICD-10-CM | POA: Diagnosis not present

## 2023-05-13 DIAGNOSIS — I480 Paroxysmal atrial fibrillation: Secondary | ICD-10-CM

## 2023-05-13 DIAGNOSIS — F322 Major depressive disorder, single episode, severe without psychotic features: Secondary | ICD-10-CM | POA: Diagnosis not present

## 2023-05-13 DIAGNOSIS — M25512 Pain in left shoulder: Secondary | ICD-10-CM | POA: Diagnosis not present

## 2023-05-13 DIAGNOSIS — G8929 Other chronic pain: Secondary | ICD-10-CM

## 2023-05-13 DIAGNOSIS — K219 Gastro-esophageal reflux disease without esophagitis: Secondary | ICD-10-CM

## 2023-05-13 DIAGNOSIS — Z7984 Long term (current) use of oral hypoglycemic drugs: Secondary | ICD-10-CM

## 2023-05-13 DIAGNOSIS — Z794 Long term (current) use of insulin: Secondary | ICD-10-CM

## 2023-05-13 MED ORDER — INSULIN LISPRO (1 UNIT DIAL) 100 UNIT/ML (KWIKPEN)
5.0000 [IU] | PEN_INJECTOR | Freq: Three times a day (TID) | SUBCUTANEOUS | 3 refills | Status: DC
  Filled 2023-05-13: qty 30, 200d supply, fill #0

## 2023-05-13 MED ORDER — PANTOPRAZOLE SODIUM 40 MG PO TBEC
40.0000 mg | DELAYED_RELEASE_TABLET | Freq: Two times a day (BID) | ORAL | 1 refills | Status: DC
  Filled 2023-05-13 – 2023-08-13 (×2): qty 180, 90d supply, fill #0

## 2023-05-13 MED ORDER — LANTUS SOLOSTAR 100 UNIT/ML ~~LOC~~ SOPN
30.0000 [IU] | PEN_INJECTOR | Freq: Every day | SUBCUTANEOUS | 3 refills | Status: DC
  Filled 2023-05-13 – 2023-06-04 (×2): qty 15, 50d supply, fill #0

## 2023-05-13 MED ORDER — LACTULOSE 10 GM/15ML PO SOLN
20.0000 g | Freq: Three times a day (TID) | ORAL | 1 refills | Status: AC
  Filled 2023-05-13: qty 946, 11d supply, fill #0

## 2023-05-13 MED ORDER — DULOXETINE HCL 60 MG PO CPEP
60.0000 mg | ORAL_CAPSULE | Freq: Every day | ORAL | 1 refills | Status: DC
  Filled 2023-05-13 – 2023-08-13 (×2): qty 90, 90d supply, fill #0
  Filled 2023-11-06: qty 90, 90d supply, fill #1

## 2023-05-13 MED ORDER — METFORMIN HCL 500 MG PO TABS
1000.0000 mg | ORAL_TABLET | Freq: Two times a day (BID) | ORAL | 1 refills | Status: DC
  Filled 2023-05-13: qty 360, 90d supply, fill #0

## 2023-05-13 MED ORDER — METOPROLOL SUCCINATE ER 25 MG PO TB24
12.5000 mg | ORAL_TABLET | Freq: Every day | ORAL | 1 refills | Status: DC
  Filled 2023-05-13 – 2023-08-13 (×2): qty 45, 90d supply, fill #0
  Filled 2023-11-06: qty 45, 90d supply, fill #1
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
## 2023-05-19 ENCOUNTER — Other Ambulatory Visit: Payer: Self-pay

## 2023-05-19 NOTE — Patient Instructions (Signed)
Visit Information  [name] was given information about Medicaid Managed Care team care coordination services as a part of their [facility] Medicaid benefit. [name] verbally consented to engagement with the [facility] Managed Care team.   If you are experiencing a medical emergency, please call 911 or report to your local emergency department or urgent care.   If you have a non-emergency medical problem during routine business hours, please contact your provider's office and ask to speak with a nurse.   For questions related to your [facility] health plan, please call: [phone] or go here:[email or web address]  If you would like to schedule transportation through your [facility] plan, please call the following number at least 2 days in advance of your appointment: [phone].   You can also use the MTM portal or MTM mobile app to manage your rides. Reimbursement for transportation is available through [facility]! For the portal, please go to [email or web address]  Call the [facility] Crisis Line at [phone], at any time, 24 hours a day, 7 days a week. If you are in danger or need immediate medical attention call 911.  If you would like help to quit smoking, call 1-800-QUIT-NOW ((936)239-3775) OR Espaol: 1-855-Djelo-Ya (0-347-425-9563) o para ms informacin haga clic aqu or Text READY to 875-643 to register via text  [name] - following are the goals we discussed in your visit today:   Goals Addressed   None      Social Worker will follow up in 30 days.   Gus Puma, Kenard Gower, MHA [facility]  Managed Medicaid Social Worker [phone]   Following is a copy of your plan of care:  There are no care plans that you recently modified to display for this patient.

## 2023-05-19 NOTE — Patient Outreach (Signed)
Medicaid Managed Care Social Work Note  05/19/2023 Name:  Gary Mitchell MRN:  [id number] DOB:  [birth year]  Gary Mitchell is an 52 y.o. year old male who is a primary patient of Hoy Register, MD.  The Medicaid Managed Care Coordination team was consulted for assistance with:  Community Resources   [name] was given information about Medicaid Managed Care Coordination team services today. Gary Mitchell Patient agreed to services and verbal consent obtained.  Engaged with patient  for by telephone forfollow up visit in response to referral for case management and/or care coordination services.   Assessments/Interventions:  Review of past medical history, allergies, medications, Mitchell status, including review of consultants reports, laboratory and other test data, was performed as part of comprehensive evaluation and provision of chronic care management services.  SDOH: (Social Determinant of Mitchell) assessments and interventions performed: SDOH Interventions    Flowsheet Row Office Visit from 05/13/2023 in Gary Mitchell [facility] Gary Mitchell - A Dept Of Gary Mitchell. [facility] Office Visit from 10/16/2022 in Gary Mitchell [facility] - A Dept Of Gary Mitchell. [facility]  SDOH Interventions    Depression Interventions/Treatment  Medication, Counseling Medication, Currently on Treatment  Physical Activity Interventions Patient Declined --  Stress Interventions Intervention Not Indicated --  Mitchell Literacy Interventions Intervention Not Indicated --     BSW completed a telephone outreach with patient, he states he did receive the resources BSW sent to him. Patient states he is not living in the hotel anymore, he is staying where ever he can. Patient states his mother is staying with her sister. BSW verbally provided patient with the telephone number for [facility].  Advanced Directives Status:  Not addressed in this encounter.  Care Plan                  Allergies  Allergen Reactions   Iohexol Other (See Comments)    Unknown reaction at 66 days old Gary Mitchell at bedside reported patient was given injections of iohexol in foot and resulted in "hole in foot or foot got infected"    Medications Reviewed Today   Medications were not reviewed in this encounter     Patient Active Problem List   Diagnosis Date Noted   Pancreatic cyst 04/30/2023   Microcytic anemia 04/30/2023   Alcohol use disorder 04/30/2023   Acute metabolic encephalopathy 03/31/2023   Encounter for medication review and counseling 11/13/2022   Paroxysmal atrial fibrillation (HCC) 08/08/2022   Hypercoagulable state due to paroxysmal atrial fibrillation (HCC) 08/08/2022   Malnutrition of moderate degree (HCC) 05/08/2022   Benign neoplasm of descending colon    Symptomatic anemia 05/05/2022   Hyperlipidemia 05/05/2022   Hyperammonemia (HCC) 05/03/2022   Type 2 diabetes mellitus (HCC) 05/29/2021   Portal hypertensive gastropathy (HCC)    Esophageal varices without bleeding (HCC)    Seizure disorder (HCC) 09/17/2020   Alcoholic cirrhosis of liver without ascites (HCC) 09/17/2020   Acute hepatic encephalopathy (HCC) 09/17/2020   Suspected CVA (cerebral vascular accident) (HCC) 09/17/2020   Seizures (HCC) 07/09/2020   High bilirubin 07/09/2020   Anemia 07/09/2020   Thrombocytopenia (HCC) 07/09/2020   COVID-19 virus infection 07/09/2020   GERD (gastroesophageal reflux disease) 07/09/2020   Pancreatic mass 11/29/2019   Alcoholic hepatitis 11/23/2019   Gallstones 11/23/2019   Alcohol abuse 11/23/2019    Conditions to be addressed/monitored per PCP order:   community resources  There are no care plans that you recently modified to  display for this patient.   Follow up:  Patient agrees to Care Plan and Follow-up.  Plan: The Managed Medicaid care management team will reach out to the patient again over the next 30 days.  Date/time of next scheduled Social Work care  management/care coordination outreach:  06/18/23  Gus Puma, Kenard Gower, [facility]  Managed [facility] Social Worker [phone]

## 2023-05-20 ENCOUNTER — Ambulatory Visit: Payer: 59

## 2023-06-03 ENCOUNTER — Other Ambulatory Visit: Payer: Self-pay

## 2023-06-03 ENCOUNTER — Ambulatory Visit: Payer: 59 | Attending: Family Medicine

## 2023-06-03 DIAGNOSIS — M6281 Muscle weakness (generalized): Secondary | ICD-10-CM | POA: Insufficient documentation

## 2023-06-03 DIAGNOSIS — M25512 Pain in left shoulder: Secondary | ICD-10-CM | POA: Insufficient documentation

## 2023-06-03 DIAGNOSIS — G8929 Other chronic pain: Secondary | ICD-10-CM | POA: Diagnosis present

## 2023-06-03 DIAGNOSIS — Z7689 Persons encountering health services in other specified circumstances: Secondary | ICD-10-CM | POA: Diagnosis not present

## 2023-06-03 NOTE — Therapy (Signed)
OUTPATIENT PHYSICAL THERAPY SHOULDER EVALUATION   Patient Name: Gary Mitchell MRN: [id number] DOB:08-26-1970, 52 y.o., male Today's Date: 06/03/2023  END OF SESSION:  PT End of Session - 06/03/23 2203     Visit Number 1    Number of Visits 13    Date for PT Re-Evaluation 07/25/23    Authorization Type UNITED HEALTHCARE OTHER; [place] MEDICAID [facility]    PT Start Time 1015    PT Stop Time 1100    PT Time Calculation (min) 45 min    Activity Tolerance Patient tolerated treatment well    Behavior During Therapy [facility] for tasks assessed/performed             Past Medical History:  Diagnosis Date   Alcoholic hepatitis 11/2019   Anemia    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   Seizures (HCC)    Thrombocytopenia (HCC)    Past Surgical History:  Procedure Laterality Date   BIOPSY  08/02/2021   Procedure: BIOPSY;  Surgeon: Rachael Fee, MD;  [facility]: [facility];  Service: Endoscopy;;   BIOPSY  05/07/2022   Procedure: BIOPSY;  Surgeon: Jenel Lucks, MD;  Location: [facility];  Service: Gastroenterology;;   COLONOSCOPY WITH PROPOFOL N/A 05/08/2022   Procedure: COLONOSCOPY WITH PROPOFOL;  Surgeon: Jenel Lucks, MD;  Location: [facility];  Service: Gastroenterology;  Laterality: N/A;   ENTEROSCOPY N/A 09/20/2020   Procedure: ENTEROSCOPY;  Surgeon: Tressia Danas, MD;  Location: [facility] END[facility]COPY;  Service: Gastroenterology;  Laterality: N/A;   ENTEROSCOPY N/A 05/11/2022   Procedure: ENTEROSCOPY;  Surgeon: Jenel Lucks, MD;  Location: [facility];  Service: Gastroenterology;  Laterality: N/A;   ESOPHAGOGASTRODUODENOSCOPY (EGD) WITH PROPOFOL N/A 08/02/2021   Procedure: ESOPHAGOGASTRODUODENOSCOPY (EGD) WITH PROPOFOL;  Surgeon: Rachael Fee, MD;  [facility];  Service: Endoscopy;  Laterality: N/A;   ESOPHAGOGASTRODUODENOSCOPY (EGD) WITH PROPOFOL N/A 01/31/2022   Procedure: ESOPHAGOGASTRODUODENOSCOPY (EGD) WITH PROPOFOL;  Surgeon: Rachael Fee, MD;  [facility];  Service: Gastroenterology;  Laterality: N/A;   ESOPHAGOGASTRODUODENOSCOPY (EGD) WITH PROPOFOL N/A 05/07/2022   Procedure: ESOPHAGOGASTRODUODENOSCOPY (EGD) WITH PROPOFOL;  Surgeon: Jenel Lucks, MD;  [facility];  Service: Gastroenterology;  Laterality: N/A;   ESOPHAGOGASTRODUODENOSCOPY (EGD) WITH PROPOFOL N/A 06/13/2022   Procedure: ESOPHAGOGASTRODUODENOSCOPY (EGD) WITH PROPOFOL;  Surgeon: Meridee Score Netty Starring., MD;  [facility];  Service: Gastroenterology;  Laterality: N/A;   EUS N/A 01/31/2022   Procedure: UPPER ENDOSCOPIC ULTRASOUND (EUS) RADIAL;  Surgeon: Rachael Fee, MD;  [facility];  Service: Gastroenterology;  Laterality: N/A;   EUS N/A 06/13/2022   Procedure: UPPER ENDOSCOPIC ULTRASOUND (EUS) LINEAR;  Surgeon: Lemar Lofty., MD;  [facility];  Service: Gastroenterology;  Laterality: N/A;   FINE NEEDLE ASPIRATION N/A 08/02/2021   Procedure: FINE NEEDLE ASPIRATION (FNA) LINEAR;  Surgeon: Rachael Fee, MD;  [facility];  Service: Endoscopy;  Laterality: N/A;   FINE NEEDLE ASPIRATION N/A 01/31/2022   Procedure: FINE NEEDLE ASPIRATION (FNA) LINEAR;  Surgeon: Christella Hartigan,  Melton Alar, MD;  Location: [facility];  Service: Gastroenterology;  Laterality: N/A;   FINE NEEDLE ASPIRATION N/A 06/13/2022   Procedure: FINE NEEDLE ASPIRATION (FNA) LINEAR;  Surgeon: Lemar Lofty., MD;  [facility];  Service: Gastroenterology;  Laterality:  N/A;   GIVENS CAPSULE STUDY N/A 05/09/2022   Procedure: GIVENS CAPSULE STUDY;  Surgeon: Jenel Lucks, MD;  [facility];  Service: Gastroenterology;  Laterality: N/A;   HEMOSTASIS CLIP PLACEMENT  05/08/2022   Procedure: HEMOSTASIS CLIP PLACEMENT;  Surgeon: Jenel Lucks, MD;  Location: [facility];  Service: Gastroenterology;;   HEMOSTASIS CLIP PLACEMENT  05/11/2022   Procedure: HEMOSTASIS CLIP PLACEMENT;  Surgeon: Jenel Lucks, MD;  [facility];  Service: Gastroenterology;;   HOT HEMOSTASIS N/A 06/13/2022   Procedure: HOT HEMOSTASIS (ARGON PLASMA COAGULATION/BICAP);  Surgeon: Lemar Lofty., MD;  Location: [facility];  Service: Gastroenterology;  Laterality: N/A;   left kidney removed     POLYPECTOMY  05/08/2022   Procedure: POLYPECTOMY;  Surgeon: Jenel Lucks, MD;  Location: [facility];  Service: Gastroenterology;;   UPPER ESOPHAGEAL ENDOSCOPIC ULTRASOUND (EUS) N/A 08/02/2021   Procedure: UPPER ESOPHAGEAL ENDOSCOPIC ULTRASOUND (EUS);  Surgeon: Rachael Fee, MD;  Location: [facility];  Service: Endoscopy;  Laterality: N/A;  Radial and Linear   Patient Active Problem List   Diagnosis Date Noted   Pancreatic cyst 04/30/2023   Microcytic anemia 04/30/2023   Alcohol use disorder 04/30/2023   Acute metabolic encephalopathy 03/31/2023   Encounter for medication review and counseling 11/13/2022   Paroxysmal atrial fibrillation (HCC) 08/08/2022   Hypercoagulable state due to paroxysmal atrial fibrillation (HCC) 08/08/2022   Malnutrition of moderate degree (HCC) 05/08/2022   Benign neoplasm of descending colon    Symptomatic anemia 05/05/2022   Hyperlipidemia 05/05/2022   Hyperammonemia (HCC) 05/03/2022   Type 2 diabetes mellitus (HCC) 05/29/2021   Portal hypertensive gastropathy (HCC)    Esophageal varices without bleeding (HCC)    Seizure disorder (HCC) 09/17/2020   Alcoholic cirrhosis of liver without ascites (HCC) 09/17/2020    Acute hepatic encephalopathy (HCC) 09/17/2020   Suspected CVA (cerebral vascular accident) (HCC) 09/17/2020   Seizures (HCC) 07/09/2020   High bilirubin 07/09/2020   Anemia 07/09/2020   Thrombocytopenia (HCC) 07/09/2020   COVID-19 virus infection 07/09/2020   GERD (gastroesophageal reflux disease) 07/09/2020   Pancreatic mass 11/29/2019   Alcoholic hepatitis 11/23/2019   Gallstones 11/23/2019   Alcohol abuse 11/23/2019    PCP: Hoy Register, MD   REFERRING PROVIDER: Hoy Register, MD   REFERRING DIAG: M25.512,G89.29 (ICD-10-CM) - Chronic left shoulder pain   THERAPY DIAG:  Chronic left shoulder pain  Muscle weakness (generalized)  Rationale for Evaluation and Treatment: Rehabilitation  ONSET DATE: 3+ years  SUBJECTIVE:  SUBJECTIVE STATEMENT: Pt reports a chronic hx of L shoulder pain without a precipitating incident. Pt notes it hurts primarily with sleeping and when reaching up or behind him.  Hand dominance: Right  PERTINENT HISTORY:   PAIN:  Are you having pain? Yes: NPRS scale: 3/10. Pain range over the last week: 3-7/10 Pain location: L shoulder to the lateral upper arm Pain description: Ache, throb, popping Aggravating factors:  Relieving factors: Rest  PRECAUTIONS: None  RED FLAGS: None   WEIGHT BEARING RESTRICTIONS: No  FALLS:  Has patient fallen in last 6 months? Yes Sometimes falls when he has seizures  LIVING ENVIRONMENT: Lives with: lives with their family Lives in: House/apartment   OCCUPATION: Applying for disability  PLOF: Independent  PATIENT GOALS:  NEXT MD VISIT:   OBJECTIVE:  Note: Objective measures were completed at Evaluation unless otherwise noted.  DIAGNOSTIC FINDINGS:  None on Epic  PATIENT SURVEYS:  FOTO: Perceived function   %,  predicted   %   COGNITION: Overall cognitive status: Within functional limits for tasks assessed     SENSATION: WFL  POSTURE: Forward head, rounded shoulders  UPPER EXTREMITY ROM:   Active ROM Right eval Left eval  Shoulder flexion    Shoulder extension    Shoulder abduction    Shoulder adduction    Shoulder internal rotation    Shoulder external rotation    Elbow flexion    Elbow extension    Wrist flexion    Wrist extension    Wrist ulnar deviation    Wrist radial deviation    Wrist pronation    Wrist supination    (Blank rows = not tested)  UPPER EXTREMITY MMT:  MMT Right eval Left eval  Shoulder flexion    Shoulder extension    Shoulder abduction    Shoulder adduction    Shoulder internal rotation    Shoulder external rotation    Middle trapezius    Lower trapezius    Elbow flexion    Elbow extension    Wrist flexion    Wrist extension    Wrist ulnar deviation    Wrist radial deviation    Wrist pronation    Wrist supination    Grip strength (lbs)    (Blank rows = not tested)  SHOULDER SPECIAL TESTS: Impingement tests: Hawkins/Kennedy impingement test: positive  SLAP lesions:  NT Instability tests:  NT Rotator cuff assessment: Empty can test: negative and Full can test: negative Biceps assessment: Yergason's test: negative  JOINT MOBILITY TESTING:  NT  PALPATION:  Not   TODAY'S TREATMENT:      [facility] Adult PT Treatment:                                                DATE: 06/03/23 Therapeutic Exercise: Developed, instructed in, and pt completed therex as noted in HEP  Self Care:  PATIENT EDUCATION: Education details: Eval findings, POC, HEP, self care  Person educated: Patient Education method: Explanation, Demonstration, Tactile cues, Verbal cues, and Handouts Education comprehension: verbalized  understanding, returned demonstration, verbal cues required, and tactile cues required  HOME EXERCISE PROGRAM: Access Code: [email or web address] URL: [email or web address] Date: 06/03/2023 Prepared by: Joellyn Rued  Exercises - Standing Shoulder Row with Anchored Resistance  - 1 x daily - 7 x weekly - 2 sets - 10 reps - 2 hold - Shoulder Extension with Resistance  - 1 x daily - 7 x weekly - 2 sets - 10 reps - 2 hold  ASSESSMENT:  CLINICAL IMPRESSION: Patient is a 52 y.o. male who was seen today for physical therapy evaluation and treatment for M25.512,G89.29 (ICD-10-CM) - Chronic left shoulder pain .   OBJECTIVE IMPAIRMENTS: decreased ROM, decreased strength, impaired UE functional use, and pain.   ACTIVITY LIMITATIONS: carrying, lifting, and reach over head  PARTICIPATION LIMITATIONS: meal prep, cleaning, and laundry  PERSONAL FACTORS: Fitness, Past/current experiences, and Time since onset of injury/illness/exacerbation are also affecting patient's functional outcome.   REHAB POTENTIAL: Good  CLINICAL DECISION MAKING: Stable/uncomplicated  EVALUATION COMPLEXITY: Low   GOALS:  SHORT TERM GOALS: Target date:    Baseline: Goal status: INITIAL  2.   Baseline:  Goal status: INITIAL  3.   Baseline:  Goal status: INITIAL  4.   Baseline:  Goal status: INITIAL  5.   Baseline:  Goal status: INITIAL  6.   Baseline:  Goal status: INITIAL  LONG TERM GOALS: Target date:    Baseline:  Goal status: INITIAL  2.   Baseline:  Goal status: INITIAL  3.   Baseline:  Goal status: INITIAL  4.   Baseline:  Goal status: INITIAL  5.   Baseline:  Goal status: INITIAL  6.   Baseline:  Goal status: INITIAL  PLAN:  PT FREQUENCY: 2x/week  PT DURATION: 6 weeks  PLANNED INTERVENTIONS: 97164- PT Re-evaluation, 97110-Therapeutic exercises, 97530- Therapeutic activity, 97535- Self Care, 54098- Manual therapy, 97014- Electrical stimulation (unattended), [id number]  Electrical stimulation (manual), [id number]- Ultrasound, 11914- Ionotophoresis 4mg /ml Dexamethasone, Patient/Family education, Taping, Dry Needling, Joint mobilization, Cryotherapy, and Moist heat  PLAN FOR NEXT SESSION: Review FOTO; assess response to HEP; progress therex as indicated; use of modalities, manual therapy; and TPDN as indicated.    Sonni Barse MS, PT 06/03/23 10:23 PM

## 2023-06-04 ENCOUNTER — Other Ambulatory Visit: Payer: Self-pay

## 2023-06-04 ENCOUNTER — Other Ambulatory Visit: Payer: Self-pay | Admitting: Family Medicine

## 2023-06-04 DIAGNOSIS — E1169 Type 2 diabetes mellitus with other specified complication: Secondary | ICD-10-CM

## 2023-06-04 MED ORDER — LANTUS SOLOSTAR 100 UNIT/ML ~~LOC~~ SOPN: 30.0000 [IU] | PEN_INJECTOR | Freq: Every day | SUBCUTANEOUS | 3 refills | Status: DC

## 2023-06-04 MED ORDER — FREESTYLE LIBRE 2 SENSOR MISC: 4 refills | Status: DC

## 2023-06-04 NOTE — Telephone Encounter (Signed)
Change in pharmacy- will send remainder of Rx to requested pharmacy Requested Prescriptions  Pending Prescriptions Disp Refills   Continuous Glucose Sensor (FREESTYLE LIBRE 2 SENSOR) MISC 2 each 6    Sig: Use to check blood sugar continuously throughout the day. Change sensors once every 14 days. E11.69     Endocrinology: Diabetes - Testing Supplies Passed - 06/04/2023 12:25 PM      Passed - Valid encounter within last 12 months    Recent Outpatient Visits           3 weeks ago Chronic left shoulder pain   [place] [facility] - A Dept Of [place]. [facility] Hoy Register, MD   1 month ago Long term (current) use of insulin [facility])   [place] [facility] [place] - A Dept Of [place]. [facility] Lois Huxley, [place] L, RPH-CPP   3 months ago Type 2 diabetes mellitus with other specified complication, with long-term current use of insulin (HCC)   [place] [facility] - A Dept Of [place]. [facility] Lois Huxley, [place] L, RPH-CPP   3 months ago Type 2 diabetes mellitus with other specified complication, with long-term current use of insulin (HCC)   [place] [facility] - A Dept Of [place]. [facility] Lois Huxley, [place] L, RPH-CPP   4 months ago Type 2 diabetes mellitus with other specified complication, without long-term current use of insulin (HCC)   [place] [facility] - A Dept Of [place]. [facility] [place] Hoy Register, MD       Future Appointments             In 5 days Lois Huxley, Cornelius Moras, RPH-CPP [place] [facility] [place] - A Dept Of [place]. [facility]   In 2 months Hoy Register, MD [facility] [place] - A Dept Of [place]. [facility]             insulin glargine (LANTUS SOLOSTAR) 100 UNIT/ML Solostar Pen 15 mL 3    Sig: Inject 30 Units into the skin daily.     Endocrinology:  Diabetes - Insulins Failed - 06/04/2023 12:25 PM       Failed - HBA1C is between 0 and 7.9 and within 180 days    HbA1c, POC (controlled diabetic range)  Date Value Ref Range Status  01/22/2023 8.9 (A) 0.0 - 7.0 % Final   Hgb A1c MFr Bld  Date Value Ref Range Status  03/31/2023 8.6 (H) 4.8 - 5.6 % Final    Comment:    (NOTE) Pre diabetes:          5.7%-6.4%  Diabetes:              >6.4%  Glycemic control for   <7.0% adults with diabetes          Passed - Valid encounter within last 6 months    Recent Outpatient Visits           3 weeks ago Chronic left shoulder pain   [place] [facility] - A Dept Of [place]. [facility] Hoy Register, MD   1 month ago Long term (current) use of insulin [facility])   [place] [facility] [place] - A Dept Of . [facility] Lois Huxley, [place] L, RPH-CPP   3 months ago Type 2 diabetes mellitus with other specified complication, with  long-term current use of insulin (HCC)   [place] [facility] [place] - A Dept Of [place]. [facility] Lois Huxley, [place] L, RPH-CPP   3 months ago Type 2 diabetes mellitus with other specified complication, with long-term current use of insulin (HCC)   [place] [facility] - A Dept Of [place]. [facility] Lois Huxley, [place] L, RPH-CPP   4 months ago Type 2 diabetes mellitus with other specified complication, without long-term current use of insulin (HCC)   [place] [facility] - A Dept Of [place]. [facility] Hoy Register, MD       Future Appointments             In 5 days Lois Huxley, Cornelius Moras, RPH-CPP [place] [facility] [place] - A Dept Of [place]. [facility]   In 2 months Hoy Register, MD [facility] [place] - A Dept Of [place]. [facility]

## 2023-06-04 NOTE — Telephone Encounter (Signed)
Medication Refill -  Most Recent Primary Care Visit:  Provider: Hoy Register  Department[facility]  Visit Type: OFFICE VISIT  Date: 05/13/2023  Medication: Continuous Glucose Sensor (FREESTYLE LIBRE 2 SENSOR) MISC [[id number]] insulin glargine (LANTUS SOLOSTAR) 100 UNIT/ML Solostar Pen [[id number]]   Has the patient contacted their pharmacy? Yes  (Agent: If yes, when and what did the pharmacy advise?) Contact PCP  Is this the correct pharmacy for this prescription? Yes  This is the patient's preferred pharmacy:   [id number] -  (SE), [place] - [address]    Has the prescription been filled recently? Yes  Is the patient out of the medication? Yes  Has the patient been seen for an appointment in the last year OR does the patient have an upcoming appointment? Yes  Can we respond through MyChart? No  Agent: Please be advised that Rx refills may take up to 3 business days. We ask that you follow-up with your pharmacy.

## 2023-06-04 NOTE — Addendum Note (Signed)
Addended by: Joellyn Rued on: 06/04/2023 06:13 PM   Modules accepted: Orders

## 2023-06-06 ENCOUNTER — Other Ambulatory Visit: Payer: Self-pay

## 2023-06-08 DIAGNOSIS — Z419 Encounter for procedure for purposes other than remedying health state, unspecified: Secondary | ICD-10-CM | POA: Diagnosis not present

## 2023-06-09 ENCOUNTER — Other Ambulatory Visit: Payer: Self-pay

## 2023-06-09 ENCOUNTER — Ambulatory Visit: Payer: 59 | Attending: Family Medicine | Admitting: Pharmacist

## 2023-06-09 ENCOUNTER — Encounter: Payer: Self-pay | Admitting: Pharmacist

## 2023-06-09 DIAGNOSIS — Z7984 Long term (current) use of oral hypoglycemic drugs: Secondary | ICD-10-CM | POA: Diagnosis not present

## 2023-06-09 DIAGNOSIS — Z794 Long term (current) use of insulin: Secondary | ICD-10-CM

## 2023-06-09 DIAGNOSIS — E1169 Type 2 diabetes mellitus with other specified complication: Secondary | ICD-10-CM

## 2023-06-09 MED ORDER — INSULIN LISPRO (1 UNIT DIAL) 100 UNIT/ML (KWIKPEN)
10.0000 [IU] | PEN_INJECTOR | Freq: Three times a day (TID) | SUBCUTANEOUS | 3 refills | Status: DC
  Filled 2023-06-09: qty 24, 80d supply, fill #0
  Filled 2023-09-15: qty 24, 80d supply, fill #1

## 2023-06-09 NOTE — Progress Notes (Signed)
S:    52 y.o. male who presents for diabetes evaluation, education, and management. PMH is significant for T2DM, hx of a fib w/ prior CVA, hx of acute pancreatitis secondary to alcohol use, GERD, alcoholic cirrhosis of the liver, esophageal varies. No known CAD, ACS, or other clinical ASCVD hx. No CHF or CKD hx.   Patient was referred and last seen by Primary Care Provider, Dr. Alvis Lemmings, on 01/22/2023. He was originally referred for our LIBERATE study but he does not have a compatible phone. Seen by pharmacist on 03/04/2023. At that visit, glipizide was discontinued, metformin was continued, Lantus increased from 10u to 20u daily, and began Humalog 5u TID. Last A1c was 8.6% on 9/23 during hospital admission.   At last pharmacist visit on 9/30 he reported only taking Humalog once daily. We encouraged him to take his medications as prescribed and did not make any medication changes. He saw Dr. Alvis Lemmings on 11/5 and reported self adjusting his Humalog to 10 units TID due to elevated blood sugars. She instructed him to continue Humalog 5 units TID and increased Lantus to 30 units daily.  Today patient presents to clinic in good spirits. He endorses adherence to medications as prescribed. He endorses s/sx of hyperglycemia including nocturia and polydipsia. Reports that BG has improved over the last week since he stopped eating late at night, but readings are still high.  Family/Social History:  Fhx: DM Tobacco: former smoker (quit in 2019) Alcohol: no current use reported and patient denies    Current diabetes medications include: Lantus 30 units daily, metformin 500 mg tabs - takes 2 tablets (1000mg ) BID, Humalog 5 units TID with meals Current hyperlipidemia medications include: atorvastatin 20 mg   Patient reports adherence to taking all medications as prescribed.   Insurance coverage: Managed Medicaid   Patient denies hypoglycemic events.  Patient endorses nocturia (nighttime urination).   Patient denies neuropathy (nerve pain). Patient denies visual changes. Patient reports self foot exams.   Patient reported dietary habits: Eats 2-3 meals/day - Breakfast: whatever is available, sausage, cherry turnover, chicken broth - Dinner: whatever is available, [place], mashed potatoes, mac n cheese - Drinks: mostly water  Patient-reported exercise habits: none  O:  Libre2 CGM Meter Report from last 7 days Time CGM active: 43% Average Glucose: 201 - Time in range: 47% - Time above range: 53% - Time below range: 0% Patterns: highest after dinner then normalizes overnight  Lab Results  Component Value Date   HGBA1C 8.6 (H) 03/31/2023   There were no vitals filed for this visit.  Lipid Panel     Component Value Date/Time   TRIG 120 07/09/2020 1240   Clinical Atherosclerotic Cardiovascular Disease (ASCVD): Yes  The ASCVD Risk score (Arnett DK, et al., 2019) failed to calculate for the following reasons:   The patient has a prior MI or stroke diagnosis   Patient is participating in a Managed Medicaid Plan: Yes   A/P: Diabetes longstanding, currently uncontrolled. Patient is able to verbalize appropriate hypoglycemia management plan. Medication adherence appears okay. Control is suboptimal due to need for additional therapy. Will increase Humalog dose today given his PP BG elevations and symptoms. There is room for diet optimization as well, but given his food and housing insecurity, will focus on insulin management. He is current following with social work for these needs. -Continue Lantus 30 units daily -Increase Humalog to 10 units TID - before meals -Continue metformin 500 mg - 2 tablets (1000mg ) twice daily -Patient  educated on purpose, proper use, and potential adverse effects of medications.  -Extensively discussed pathophysiology of diabetes, recommended lifestyle interventions, dietary effects on blood sugar control.  -Counseled on s/sx of and management of  hypoglycemia.  -Next A1c anticipated 06/30/2023.    Written patient instructions provided. Patient verbalized understanding of treatment plan.  Total time in face to face counseling 30 minutes.    Follow-up:  Pharmacist on 06/09/2023 PCP clinic visit on 04/28/2023  Jarrett Ables, PharmD PGY-1 Pharmacy Resident

## 2023-06-18 ENCOUNTER — Other Ambulatory Visit: Payer: Self-pay

## 2023-06-18 NOTE — Patient Instructions (Signed)
Visit Information  Gary Mitchell was given information about Medicaid Managed Care team care coordination services as a part of their [facility] Medicaid benefit. Gary Mitchell verbally consented to engagement with the [facility] Managed Care team.   If you are experiencing a medical emergency, please call 911 or report to your local emergency department or urgent care.   If you have a non-emergency medical problem during routine business hours, please contact your provider's office and ask to speak with a nurse.   For questions related to your [facility] health plan, please call: [phone] or go here:[email or web address]  If you would like to schedule transportation through your [facility] plan, please call the following number at least 2 days in advance of your appointment: [phone].   You can also use the MTM portal or MTM mobile app to manage your rides. Reimbursement for transportation is available through [facility]! For the portal, please go to [email or web address]  Call the [facility] Crisis Line at [phone], at any time, 24 hours a day, 7 days a week. If you are in danger or need immediate medical attention call 911.  If you would like help to quit smoking, call 1-800-QUIT-NOW ((936)239-3775) OR Espaol: 1-855-Djelo-Ya (0-347-425-9563) o para ms informacin haga clic aqu or Text READY to 875-643 to register via text  Gary Mitchell - following are the goals we discussed in your visit today:   Goals Addressed   None      Social Worker will follow up in 30 days.   Gus Puma, Kenard Gower, MHA [facility] Health  Managed Medicaid Social Worker [phone]   Following is a copy of your plan of care:  There are no care plans that you recently modified to display for this patient.

## 2023-06-18 NOTE — Patient Outreach (Signed)
Medicaid Managed Care Social Work Note  06/18/2023 Name:  Gary Mitchell MRN:  [id number] DOB:  08/01/1970  Gary Mitchell is an 52 y.o. year old male who is a primary patient of Hoy Register, MD.  The Medicaid Managed Care Coordination team was consulted for assistance with:   housing  Gary Mitchell was given information about Medicaid Managed Care Coordination team services today. Gary Mitchell Patient agreed to services and verbal consent obtained.  Engaged with patient  for by telephone forfollow up visit in response to referral for case management and/or care coordination services.   Assessments/Interventions:  Review of past medical history, allergies, medications, health status, including review of consultants reports, laboratory and other test data, was performed as part of comprehensive evaluation and provision of chronic care management services.  SDOH: (Social Determinant of Health) assessments and interventions performed: SDOH Interventions    Flowsheet Row Office Visit from 05/13/2023 in [facility] - [facility] Dept Of [place]. [facility] Office Visit from 10/16/2022 in [facility] - A Dept Of Eligha Bridegroom. [facility]  SDOH Interventions    Depression Interventions/Treatment  Medication, Counseling Medication, Currently on Treatment  Physical Activity Interventions Patient Declined --  Stress Interventions Intervention Not Indicated --  Health Literacy Interventions Intervention Not Indicated --     BSW completed a telephone outreach with patient, he states he is still living from place to place. He did contact [facility] and he dosent feel like they help with housing. BSW informed patient she has provided all housing resources.  Advanced Directives Status:  Not addressed in this encounter.  Care Plan                 Allergies  Allergen Reactions   Iohexol Other (See Comments)    Unknown reaction at 34 days old Mom at bedside  reported patient was given injections of iohexol in foot and resulted in "hole in foot or foot got infected"    Medications Reviewed Today   Medications were not reviewed in this encounter     Patient Active Problem List   Diagnosis Date Noted   Pancreatic cyst 04/30/2023   Microcytic anemia 04/30/2023   Alcohol use disorder 04/30/2023   Acute metabolic encephalopathy 03/31/2023   Encounter for medication review and counseling 11/13/2022   Paroxysmal atrial fibrillation (HCC) 08/08/2022   Hypercoagulable state due to paroxysmal atrial fibrillation (HCC) 08/08/2022   Malnutrition of moderate degree (HCC) 05/08/2022   Benign neoplasm of descending colon    Symptomatic anemia 05/05/2022   Hyperlipidemia 05/05/2022   Hyperammonemia (HCC) 05/03/2022   Type 2 diabetes mellitus (HCC) 05/29/2021   Portal hypertensive gastropathy (HCC)    Esophageal varices without bleeding (HCC)    Seizure disorder (HCC) 09/17/2020   Alcoholic cirrhosis of liver without ascites (HCC) 09/17/2020   Acute hepatic encephalopathy (HCC) 09/17/2020   Suspected CVA (cerebral vascular accident) (HCC) 09/17/2020   Seizures (HCC) 07/09/2020   High bilirubin 07/09/2020   Anemia 07/09/2020   Thrombocytopenia (HCC) 07/09/2020   COVID-19 virus infection 07/09/2020   GERD (gastroesophageal reflux disease) 07/09/2020   Pancreatic mass 11/29/2019   Alcoholic hepatitis 11/23/2019   Gallstones 11/23/2019   Alcohol abuse 11/23/2019    Conditions to be addressed/monitored per PCP order:   housing   There are no care plans that you recently modified to display for this patient.   Follow up:  Patient agrees to Care Plan and Follow-up.  Plan:  The Managed Medicaid care management team will reach out to the patient again over the next 30 days.  Date/time of next scheduled Social Work care management/care coordination outreach:  06/19/24  Gus Puma, Kenard Gower, [facility] Health  Managed [facility] Social Worker [phone]

## 2023-06-19 ENCOUNTER — Other Ambulatory Visit: Payer: Self-pay

## 2023-07-07 ENCOUNTER — Other Ambulatory Visit: Payer: Self-pay | Admitting: Family Medicine

## 2023-07-07 ENCOUNTER — Other Ambulatory Visit: Payer: Self-pay

## 2023-07-07 DIAGNOSIS — Z794 Long term (current) use of insulin: Secondary | ICD-10-CM

## 2023-07-07 MED ORDER — FREESTYLE LIBRE 2 SENSOR MISC
6 refills | Status: AC
  Filled 2023-07-07: qty 2, 28d supply, fill #0
  Filled 2023-08-13: qty 2, 28d supply, fill #1
  Filled 2023-09-05: qty 2, 28d supply, fill #2
  Filled 2023-10-14: qty 2, 28d supply, fill #3
  Filled 2023-11-06 – 2023-11-07 (×2): qty 2, 28d supply, fill #4
  Filled 2023-12-31: qty 2, 28d supply, fill #5

## 2023-07-08 ENCOUNTER — Other Ambulatory Visit: Payer: Self-pay

## 2023-07-09 DIAGNOSIS — Z419 Encounter for procedure for purposes other than remedying health state, unspecified: Secondary | ICD-10-CM | POA: Diagnosis not present

## 2023-07-11 ENCOUNTER — Other Ambulatory Visit: Payer: Self-pay

## 2023-07-11 ENCOUNTER — Ambulatory Visit: Payer: 59 | Attending: Family Medicine | Admitting: Pharmacist

## 2023-07-11 DIAGNOSIS — E1169 Type 2 diabetes mellitus with other specified complication: Secondary | ICD-10-CM

## 2023-07-11 DIAGNOSIS — Z794 Long term (current) use of insulin: Secondary | ICD-10-CM | POA: Diagnosis not present

## 2023-07-11 DIAGNOSIS — Z7984 Long term (current) use of oral hypoglycemic drugs: Secondary | ICD-10-CM

## 2023-07-11 LAB — POCT GLYCOSYLATED HEMOGLOBIN (HGB A1C): HbA1c, POC (controlled diabetic range): 8.3 % — AB (ref 0.0–7.0)

## 2023-07-11 MED ORDER — METFORMIN HCL 500 MG PO TABS
1000.0000 mg | ORAL_TABLET | Freq: Two times a day (BID) | ORAL | 1 refills | Status: DC
  Filled 2023-07-11: qty 360, 90d supply, fill #0
  Filled 2023-10-14: qty 360, 90d supply, fill #1

## 2023-07-11 NOTE — Progress Notes (Signed)
 S:    53 y.o. male who presents for diabetes evaluation, education, and management. PMH is significant for T2DM, hx of a fib w/ prior CVA, hx of acute pancreatitis secondary to alcohol  use, GERD, alcoholic cirrhosis of the liver, esophageal varies. No known CAD, ACS, or other clinical ASCVD hx. No CHF or CKD hx.   Patient was referred and last seen by Primary Care Provider, Dr. Delbert, on 05/13/2023. We saw him on 06/09/2023. CGM showed ~47% in range. We increased Humalog  to 10u TID before meals at that visit.   Today, patient presents to clinic in good spirits. He endorses adherence to medications as prescribed. He endorses s/sx of hyperglycemia including neuropathy and blurred vision. Has an appt in March with his Ophthalmologist. He tells me that since last month, his sugars have been decreasing. He does have some readings in the 70s occasionally.   Family/Social History:  Fhx: DM Tobacco: former smoker (quit in 2019) Alcohol : no current use reported and patient denies    Current diabetes medications include: Lantus  30 units daily, metformin  500 mg tabs - takes 2 tablets (1000mg ) BID, Humalog  10 units TID with meals  Patient reports adherence to taking all medications as prescribed.   Insurance coverage: Managed Medicaid   Patient denies hypoglycemic events.  Patient endorses nocturia (nighttime urination).  Patient denies neuropathy (nerve pain). Patient denies visual changes. Patient reports self foot exams.   Patient reported dietary habits: Eats 2-3 meals/day - Breakfast: whatever is available, sausage, cherry turnover, chicken broth - Dinner: whatever is available, turkey, mashed potatoes, mac n cheese - Drinks: mostly water   Patient-reported exercise habits: none  O:  No CGM in place today. Ran out of sensors ~1.5 weeks ago.  Lab Results  Component Value Date   HGBA1C 8.3 (A) 07/11/2023   There were no vitals filed for this visit.  Lipid Panel     Component  Value Date/Time   TRIG 120 07/09/2020 1240   Clinical Atherosclerotic Cardiovascular Disease (ASCVD): Yes  The ASCVD Risk score (Arnett DK, et al., 2019) failed to calculate for the following reasons:   Risk score cannot be calculated because patient has a medical history suggesting prior/existing ASCVD   Patient is participating in a Managed Medicaid Plan: Yes   A/P: Diabetes longstanding, currently uncontrolled. Since last visit, he has experienced some borderline lows. He has a hx of alcohol  use and labile blood sugars. Was recently seen by [facility] (Dr. Dasie) for further management of alcoholic cirrhosis and pancreatic cyst, and they encouraged abstinence from alcohol . Patient is able to verbalize appropriate hypoglycemia management plan. Medication adherence appears okay. His A1c is only slightly improved and I have no CGM data today. Therefore, I have refilled his sensors with our pharmacy downstairs and will hold off on further changes until he sees Dr. Newlin next month.  -Continue Lantus  30 units daily -Continue Humalog  10 units TID - before meals -Continue metformin  500 mg - 2 tablets (1000mg ) twice daily -Patient educated on purpose, proper use, and potential adverse effects of medications.  -Extensively discussed pathophysiology of diabetes, recommended lifestyle interventions, dietary effects on blood sugar control.  -Counseled on s/sx of and management of hypoglycemia.  -Next A1c anticipated 10/2023.   Written patient instructions provided. Patient verbalized understanding of treatment plan.  Total time in face to face counseling 30 minutes.    Follow-up:  PCP clinic visit on 08/19/2023 Me in March.  Herlene Fleeta Morris, PharmD, BCACP, CPP Clinical Pharmacist [facility] [phone]

## 2023-07-21 ENCOUNTER — Telehealth: Payer: Self-pay | Admitting: Family Medicine

## 2023-07-21 ENCOUNTER — Other Ambulatory Visit: Payer: Self-pay

## 2023-07-21 NOTE — Telephone Encounter (Signed)
 Patient came in stating he needs a letter stating his current conditions for social services in order for him to get his food benefits.

## 2023-07-21 NOTE — Patient Outreach (Signed)
 Medicaid Managed Care Social Work Note  07/21/2023 Name:  Gary Mitchell MRN:  [id number] DOB:  03/21/1971  Gary Mitchell is an 53 y.o. year old male who is a primary patient of Newlin, Enobong, MD.  The Medicaid Managed Care Coordination team was consulted for assistance with:  Community Resources   Gary Mitchell was given information about Medicaid Managed Care Coordination team services today. Gary Mitchell Patient agreed to services and verbal consent obtained.  Engaged with patient  for by telephone forfollow up visit in response to referral for case management and/or care coordination services.   Patient is participating in a Managed Medicaid Plan:  Yes  Assessments/Interventions:  Review of past medical history, allergies, medications, health status, including review of consultants reports, laboratory and other test data, was performed as part of comprehensive evaluation and provision of chronic care management services.  SDOH: (Social Drivers of Health) assessments and interventions performed: SDOH Interventions    Flowsheet Row Office Visit from 05/13/2023 in [facility] [place] - A Dept Of [place]. [facility] Office Visit from 10/16/2022 in [facility] - A Dept Of Jolynn DEL. [facility]  SDOH Interventions    Depression Interventions/Treatment  Medication, Counseling Medication, Currently on Treatment  Physical Activity Interventions Patient Declined --  Stress Interventions Intervention Not Indicated --  Health Literacy Interventions Intervention Not Indicated --      BSW completed a telephone outreach with patient, he still has not located permanent housing. He states he was supposed to receive his foodstamps today but did not, encouraged patient to contact dss to make sure he did not miss a re certification. No other resources are needed at this time. BSW has provided contact information for any future needs. Advanced  Directives Status:  Not addressed in this encounter.  Care Plan                 Allergies  Allergen Reactions   Iohexol  Other (See Comments)    Unknown reaction at 60 days old Mom at bedside reported patient was given injections of iohexol  in foot and resulted in hole in foot or foot got infected    Medications Reviewed Today   Medications were not reviewed in this encounter     Patient Active Problem List   Diagnosis Date Noted   Pancreatic cyst 04/30/2023   Microcytic anemia 04/30/2023   Alcohol  use disorder 04/30/2023   Acute metabolic encephalopathy 03/31/2023   Encounter for medication review and counseling 11/13/2022   Paroxysmal atrial fibrillation (HCC) 08/08/2022   Hypercoagulable state due to paroxysmal atrial fibrillation (HCC) 08/08/2022   Malnutrition of moderate degree (HCC) 05/08/2022   Benign neoplasm of descending colon    Symptomatic anemia 05/05/2022   Hyperlipidemia 05/05/2022   Hyperammonemia (HCC) 05/03/2022   Type 2 diabetes mellitus (HCC) 05/29/2021   Portal hypertensive gastropathy (HCC)    Esophageal varices without bleeding (HCC)    Seizure disorder (HCC) 09/17/2020   Alcoholic cirrhosis of liver without ascites (HCC) 09/17/2020   Acute hepatic encephalopathy (HCC) 09/17/2020   Suspected CVA (cerebral vascular accident) (HCC) 09/17/2020   Seizures (HCC) 07/09/2020   High bilirubin 07/09/2020   Anemia 07/09/2020   Thrombocytopenia (HCC) 07/09/2020   COVID-19 virus infection 07/09/2020   GERD (gastroesophageal reflux disease) 07/09/2020   Pancreatic mass 11/29/2019   Alcoholic hepatitis 11/23/2019   Gallstones 11/23/2019   Alcohol  abuse 11/23/2019    Conditions to be addressed/monitored per PCP order:  community resources  There are no care plans that you recently modified to display for this patient.   Follow up:  Patient agrees to Care Plan and Follow-up.  Plan: The  Patient has been provided with contact information for the Managed  Medicaid care management team and has been advised to call with any health related questions or concerns.    Thersia Delene ROBINS, MHA [facility] Health  Managed [facility] Social Worker [phone]

## 2023-07-21 NOTE — Patient Instructions (Signed)
 Visit Information  [name] was given information about Medicaid Managed Care team care coordination services as a part of their [facility] Medicaid benefit. Gary Mitchell verbally consented to engagement with the [facility] Managed Care team.   If you are experiencing a medical emergency, please call 911 or report to your local emergency department or urgent care.   If you have a non-emergency medical problem during routine business hours, please contact your provider's office and ask to speak with a nurse.   For questions related to your [facility] health plan, please call: [phone] or go here:[email or web address]  If you would like to schedule transportation through your [facility] plan, please call the following number at least 2 days in advance of your appointment: [phone].   You can also use the MTM portal or MTM mobile app to manage your rides. Reimbursement for transportation is available through [facility]! For the portal, please go to [email or web address]  Call the [facility] Crisis Line at [phone], at any time, 24 hours a day, 7 days a week. If you are in danger or need immediate medical attention call 911.  If you would like help to quit smoking, call 1-800-QUIT-NOW (4158125441) OR Espaol: 1-855-Djelo-Ya (8-144-664-6430) o para ms informacin haga clic aqu or Text READY to 799-599 to register via text  [name] - following are the goals we discussed in your visit today:   Goals Addressed   None       The  Patient                                              has been provided with contact information for the Managed Medicaid care management team and has been advised to call with any health related questions or concerns.   Thersia Hoar, HEDWIG, MHA [facility] Health  Managed Medicaid Social Worker [phone]   Following is a copy of your plan of care:  There are no care plans that you recently modified to display for this  patient.

## 2023-07-21 NOTE — Telephone Encounter (Signed)
 Routing to PCP for review.

## 2023-07-23 NOTE — Telephone Encounter (Signed)
 Done

## 2023-07-24 ENCOUNTER — Telehealth: Payer: Self-pay

## 2023-07-24 NOTE — Telephone Encounter (Signed)
Letter placed in front office for pickup.

## 2023-07-24 NOTE — Telephone Encounter (Signed)
Pt was called VM was left informing patient that letter is ready for pick up.

## 2023-07-24 NOTE — Telephone Encounter (Signed)
Pharmacy Patient Advocate Encounter  Received notification from [facility] Medicaid that Prior Authorization for FREESTYLE LIBRE 2 SENSOR has been APPROVED from 07/10/2023 to 07/23/2024   PA #/Case ID/Reference #: [id number]

## 2023-07-24 NOTE — Telephone Encounter (Signed)
Pharmacy Patient Advocate Encounter   Received notification from CoverMyMeds that prior authorization for FREESTYLE LIBRE 2 SENSORS is required/requested.   Insurance verification completed.   The patient is insured through [facility] [place] .   Per test claim: PA required; PA submitted to above mentioned insurance via CoverMyMeds Key/confirmation #/EOC [facility] Status is pending

## 2023-08-04 ENCOUNTER — Other Ambulatory Visit (HOSPITAL_BASED_OUTPATIENT_CLINIC_OR_DEPARTMENT_OTHER): Payer: Self-pay

## 2023-08-09 DIAGNOSIS — Z419 Encounter for procedure for purposes other than remedying health state, unspecified: Secondary | ICD-10-CM | POA: Diagnosis not present

## 2023-08-13 ENCOUNTER — Other Ambulatory Visit: Payer: Self-pay

## 2023-08-15 ENCOUNTER — Other Ambulatory Visit: Payer: Self-pay

## 2023-08-19 ENCOUNTER — Encounter: Payer: Self-pay | Admitting: Family Medicine

## 2023-08-19 ENCOUNTER — Ambulatory Visit: Payer: Medicaid Other | Attending: Family Medicine | Admitting: Family Medicine

## 2023-08-19 ENCOUNTER — Other Ambulatory Visit: Payer: Self-pay

## 2023-08-19 VITALS — BP 144/85 | HR 80 | Ht 64.5 in | Wt 149.4 lb

## 2023-08-19 DIAGNOSIS — Z794 Long term (current) use of insulin: Secondary | ICD-10-CM

## 2023-08-19 DIAGNOSIS — E876 Hypokalemia: Secondary | ICD-10-CM

## 2023-08-19 DIAGNOSIS — K709 Alcoholic liver disease, unspecified: Secondary | ICD-10-CM

## 2023-08-19 DIAGNOSIS — N529 Male erectile dysfunction, unspecified: Secondary | ICD-10-CM

## 2023-08-19 DIAGNOSIS — Z7984 Long term (current) use of oral hypoglycemic drugs: Secondary | ICD-10-CM

## 2023-08-19 DIAGNOSIS — E1169 Type 2 diabetes mellitus with other specified complication: Secondary | ICD-10-CM

## 2023-08-19 DIAGNOSIS — I152 Hypertension secondary to endocrine disorders: Secondary | ICD-10-CM | POA: Diagnosis not present

## 2023-08-19 DIAGNOSIS — E1159 Type 2 diabetes mellitus with other circulatory complications: Secondary | ICD-10-CM

## 2023-08-19 DIAGNOSIS — Z7689 Persons encountering health services in other specified circumstances: Secondary | ICD-10-CM | POA: Diagnosis not present

## 2023-08-19 MED ORDER — SILDENAFIL CITRATE 50 MG PO TABS
50.0000 mg | ORAL_TABLET | Freq: Every day | ORAL | 0 refills | Status: DC | PRN
  Filled 2023-08-19 – 2023-09-05 (×2): qty 10, 30d supply, fill #0

## 2023-08-19 MED ORDER — POTASSIUM CHLORIDE CRYS ER 20 MEQ PO TBCR
40.0000 meq | EXTENDED_RELEASE_TABLET | Freq: Every day | ORAL | 6 refills | Status: DC
  Filled 2023-08-19: qty 60, 30d supply, fill #0
  Filled 2023-09-15: qty 60, 30d supply, fill #1

## 2023-08-19 NOTE — Patient Instructions (Signed)
VISIT SUMMARY:  During today's visit, we discussed your ongoing health concerns, including liver cirrhosis, diabetes, hypertension, depression, and hypokalemia. We also addressed your interest in a shingles vaccine and treatment for decreased libido. We reviewed your current medications and made adjustments to better manage your conditions.  YOUR PLAN:  -LIVER CIRRHOSIS: Liver cirrhosis is a condition where the liver is scarred and permanently damaged. You need to schedule an appointment with Dr. Artist Beach for further management and get blood work done to assess your liver function.  -PANCREATIC CYST: A pancreatic cyst is a fluid-filled sac on the pancreas. Continue following the management plan with your surgeon as there are no new symptoms.  -TYPE 2 DIABETES MELLITUS: Type 2 diabetes is a condition where your body does not use insulin properly. Continue your current insulin regimen (Lantus 30 units, Humalog 10 units) and regularly check your blood glucose levels with your continuous glucose monitor.  -HYPERTENSION: Hypertension is high blood pressure. Your blood pressure was elevated today, so we will recheck it. Continue with your current blood pressure medications.  Your blood pressure is still elevated at your next visit and I will adjust your regimen.  -DEPRESSION: Depression is a mood disorder causing persistent feelings of sadness and loss of interest. Resume taking Cymbalta 60mg  daily to help manage your symptoms.  -HYPOKALEMIA: Hypokalemia is a condition where you have low potassium levels. Refill your potassium supplement prescription (40mg  daily) and we will order labs to check your potassium level.  -ERECTILE DYSFUNCTION: Erectile dysfunction is the inability to get or keep an erection. We will review your current medications for potential interactions before prescribing any treatment.  -GENERAL HEALTH MAINTENANCE: We administered the shingles vaccine today to help protect you  against shingles.  INSTRUCTIONS:  Please schedule an appointment with Dr. Artist Beach for your liver cirrhosis management. Continue with your current insulin regimen and regularly check your blood glucose levels. Refill your potassium supplement prescription and resume taking Cymbalta 60mg  daily. We will recheck your blood pressure today. Follow up with your surgeon for the pancreatic cyst as needed. We will review your medications for potential interactions before prescribing any treatment for erectile dysfunction. Lastly, we administered the shingles vaccine today.

## 2023-08-19 NOTE — Progress Notes (Signed)
 Subjective:  Patient ID: Gary Mitchell, male    DOB: September 07, 1970  Age: 53 y.o. MRN: [id number]  CC: Medical Management of Chronic Issues   HPI Gary Mitchell is a 53 y.o. year old male with a history of alcoholic cirrhosis, pancreatitis, solitary kidney, necrotic cyst, history of alcoholic hepatitis in 11/2019, alcohol-related seizures, type 2 diabetes mellitus (A1c of 8.6), previously smoked (2 cig/day and quit 2022), paroxysmal A-fib.   Interval History: Discussed the use of AI scribe software for clinical note transcription with the patient, who gave verbal consent to proceed.  He presents for a follow-up visit. He reports confusion about upcoming appointments and difficulty accessing his medical chart. He has been seeing a GI specialist for his liver disease, but has not had an appointment in several months. He also reports a decrease in alcohol consumption to approximately one beer per week.  His diabetes management has been complicated by a temporary lack of supplies for his continuous glucose monitor, but he has recently resumed use. He reports that his blood sugars have been variable, sometimes dropping to 60. He is currently on 30 units of Lantus and 10 units of insulin as needed.  The patient also reports being out of his prescribed potassium supplement for over a month. He expresses confusion about his medications, including Cymbalta, which he has not been taking due to uncertainty about its purpose. He reports increased irritability and symptoms of depression, but is not interested in therapy.  He also expresses interest in a shingles vaccine and a medication to improve his libido.        Past Medical History:  Diagnosis Date   Alcoholic hepatitis 11/2019   Anemia    Cirrhosis with alcoholism (HCC) 11/2019   Coagulopathy (HCC)    Attributed to liver disease/cirrhosis   Diabetes mellitus without complication (HCC)    type 2   Neuromuscular disorder (HCC)    neuropathy   feet legs   Pancreatic lesion 11/2019   Initially concerning for neoplasm but improved appearance on MRI 12/2019 at which time pseudocyst was most likely diagnosis.   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   Seizures (HCC)    Thrombocytopenia (HCC)     Past Surgical History:  Procedure Laterality Date   BIOPSY  08/02/2021   Procedure: BIOPSY;  Surgeon: Rachael Fee, MD;  Location: [facility];  Service: Endoscopy;;   BIOPSY  05/07/2022   Procedure: BIOPSY;  Surgeon: Jenel Lucks, MD;  Location: [facility];  Service: Gastroenterology;;   COLONOSCOPY WITH PROPOFOL N/A 05/08/2022   Procedure: COLONOSCOPY WITH PROPOFOL;  Surgeon: Jenel Lucks, MD;  Location: [facility];  Service: Gastroenterology;  Laterality: N/A;   ENTEROSCOPY N/A 09/20/2020   Procedure: ENTEROSCOPY;  Surgeon: Tressia Danas, MD;  Location: [facility];  Service: Gastroenterology;  Laterality: N/A;   ENTEROSCOPY N/A 05/11/2022   Procedure: ENTEROSCOPY;  Surgeon: Jenel Lucks, MD;  Location: [facility];  Service: Gastroenterology;  Laterality: N/A;   ESOPHAGOGASTRODUODENOSCOPY (EGD) WITH PROPOFOL N/A 08/02/2021   Procedure: ESOPHAGOGASTRODUODENOSCOPY (EGD) WITH PROPOFOL;  Surgeon: Rachael Fee, MD;  [facility];  Service: Endoscopy;  Laterality: N/A;   ESOPHAGOGASTRODUODENOSCOPY (EGD) WITH PROPOFOL N/A 01/31/2022   Procedure: ESOPHAGOGASTRODUODENOSCOPY (EGD) WITH PROPOFOL;  Surgeon: Rachael Fee, MD;  [facility];  Service: Gastroenterology;  Laterality: N/A;   ESOPHAGOGASTRODUODENOSCOPY (EGD) WITH PROPOFOL N/A 05/07/2022   Procedure:  ESOPHAGOGASTRODUODENOSCOPY (EGD) WITH PROPOFOL;  Surgeon: Jenel Lucks, MD;  [facility];  Service: Gastroenterology;  Laterality: N/A;   ESOPHAGOGASTRODUODENOSCOPY (EGD) WITH PROPOFOL N/A 06/13/2022   Procedure: ESOPHAGOGASTRODUODENOSCOPY (EGD)  WITH PROPOFOL;  Surgeon: Meridee Score Netty Starring., MD;  [facility];  Service: Gastroenterology;  Laterality: N/A;   EUS N/A 01/31/2022   Procedure: UPPER ENDOSCOPIC ULTRASOUND (EUS) RADIAL;  Surgeon: Rachael Fee, MD;  [facility];  Service: Gastroenterology;  Laterality: N/A;   EUS N/A 06/13/2022   Procedure: UPPER ENDOSCOPIC ULTRASOUND (EUS) LINEAR;  Surgeon: Lemar Lofty., MD;  [facility];  Service: Gastroenterology;  Laterality: N/A;   FINE NEEDLE ASPIRATION N/A 08/02/2021   Procedure: FINE NEEDLE ASPIRATION (FNA) LINEAR;  Surgeon: Rachael Fee, MD;  [facility];  Service: Endoscopy;  Laterality: N/A;   FINE NEEDLE ASPIRATION N/A 01/31/2022   Procedure: FINE NEEDLE ASPIRATION (FNA) LINEAR;  Surgeon: Rachael Fee, MD;  [facility];  Service: Gastroenterology;  Laterality: N/A;   FINE NEEDLE ASPIRATION N/A 06/13/2022   Procedure: FINE NEEDLE ASPIRATION (FNA) LINEAR;  Surgeon: Lemar Lofty., MD;  [facility];  Service: Gastroenterology;  Laterality: N/A;   GIVENS CAPSULE STUDY N/A 05/09/2022   Procedure: GIVENS CAPSULE STUDY;  Surgeon: Jenel Lucks, MD;  [facility];  Service: Gastroenterology;  Laterality: N/A;   HEMOSTASIS CLIP PLACEMENT  05/08/2022   Procedure: HEMOSTASIS CLIP PLACEMENT;  Surgeon: Jenel Lucks, MD;  Location: [facility];  Service: Gastroenterology;;   HEMOSTASIS CLIP PLACEMENT  05/11/2022   Procedure: HEMOSTASIS CLIP PLACEMENT;  Surgeon: Jenel Lucks, MD;  [facility];  Service: Gastroenterology;;   HOT HEMOSTASIS N/A 06/13/2022   Procedure: HOT HEMOSTASIS (ARGON PLASMA COAGULATION/BICAP);  Surgeon: Lemar Lofty., MD;  Location: [facility];  Service: Gastroenterology;  Laterality: N/A;   left kidney removed     POLYPECTOMY  05/08/2022   Procedure: POLYPECTOMY;  Surgeon: Jenel Lucks, MD;  Location: [facility];  Service:  Gastroenterology;;   UPPER ESOPHAGEAL ENDOSCOPIC ULTRASOUND (EUS) N/A 08/02/2021   Procedure: UPPER ESOPHAGEAL ENDOSCOPIC ULTRASOUND (EUS);  Surgeon: Rachael Fee, MD;  Location: [facility];  Service: Endoscopy;  Laterality: N/A;  Radial and Linear    Family History  Problem Relation Age of Onset   Diabetes Mellitus II Mother    Colon cancer Neg Hx    Esophageal cancer Neg Hx    Inflammatory bowel disease Neg Hx    Liver disease Neg Hx    Pancreatic cancer Neg Hx    Rectal cancer Neg Hx    Stomach cancer Neg Hx     Social History   Socioeconomic History   Marital status: Single    Spouse name: Not on file   Number of children: Not on file   Years of education: Not on file   Highest education level: Not on file  Occupational History   Not on file  Tobacco Use   Smoking status: Former    Current packs/day: 0.00    Average packs/day: 0.1 packs/day for 25.0 years (2.5 ttl pk-yrs)    Types: Cigarettes    Start date: 75    Quit date: 2019    Years since quitting: 6.1    Passive exposure: Past   Smokeless tobacco: Never   Tobacco comments:    Former smoker 08/08/22  Vaping Use   Vaping status: Never Used  Substance and Sexual Activity   Alcohol use: Yes    Alcohol/week: 10.0 standard  drinks of alcohol    Types: 10 Shots of liquor per week   Drug use: Not Currently    Types: Marijuana   Sexual activity: Not Currently  Other Topics Concern   Not on file  Social History Narrative   Not on file   Social Drivers of Health   Financial Resource Strain: High Risk (04/11/2023)   Overall Financial Resource Strain (CARDIA)    Difficulty of Paying Living Expenses: Hard  Food Insecurity: No Food Insecurity (04/11/2023)   Hunger Vital Sign    Worried About Running Out of Food in the Last Year: Never true    Ran Out of Food in the Last Year: Never true  Transportation Needs: No Transportation Needs (04/11/2023)   PRAPARE - Administrator, Civil Service  (Medical): No    Lack of Transportation (Non-Medical): No  Physical Activity: Patient Declined (05/13/2023)   Exercise Vital Sign    Days of Exercise per Week: Patient declined    Minutes of Exercise per Session: Patient declined  Stress: No Stress Concern Present (05/13/2023)   [employer] of Occupational Health - Occupational Stress Questionnaire    Feeling of Stress : Not at all  Social Connections: Socially Isolated (05/13/2023)   Social Connection and Isolation Panel [NHANES]    Frequency of Communication with Friends and Family: Once a week    Frequency of Social Gatherings with Friends and Family: Once a week    Attends Religious Services: Never    Database administrator or Organizations: No    Attends Engineer, structural: Never    Marital Status: Never married    Allergies  Allergen Reactions   Iohexol Other (See Comments)    Unknown reaction at 49 days old Mom at bedside reported patient was given injections of iohexol in foot and resulted in "hole in foot or foot got infected"    Outpatient Medications Prior to Visit  Medication Sig Dispense Refill   Accu-Chek Softclix Lancets lancets Use to check blood sugar once daily 100 each 0   atorvastatin (LIPITOR) 20 MG tablet Take 1 tablet (20 mg total) by mouth daily. 90 tablet 3   Blood Glucose Monitoring Suppl (ACCU-CHEK GUIDE) w/Device KIT Use to check blood sugar once daily. E11.69 1 kit 0   Continuous Glucose Receiver (FREESTYLE LIBRE 2 READER) DEVI Use to check blood sugar continuously throughout the day. 1 each 0   Continuous Glucose Sensor (FREESTYLE LIBRE 2 SENSOR) MISC Use to check blood sugar continuously throughout the day. Change sensors once every 14 days. E11.69 2 each 6   DULoxetine (CYMBALTA) 60 MG capsule Take 1 capsule (60 mg total) by mouth daily. 90 capsule 1   furosemide (LASIX) 20 MG tablet Take 1 tablet (20 mg total) by mouth daily as needed. 30 tablet 0   glucose blood (ACCU-CHEK GUIDE) test  strip Use to check blood sugar once daily. E11.69 100 each 2   insulin glargine (LANTUS SOLOSTAR) 100 UNIT/ML Solostar Pen Inject 30 Units into the skin daily. 15 mL 3   insulin lispro (HUMALOG KWIKPEN) 100 UNIT/ML KwikPen Inject 10 Units into the skin 3 (three) times daily. 30 mL 3   Insulin Pen Needle (TRUEPLUS 5-BEVEL PEN NEEDLES) 32G X 4 MM MISC Use to inject insulin once daily. 100 each 0   metFORMIN (GLUCOPHAGE) 500 MG tablet Take 2 tablets (1,000 mg total) by mouth 2 (two) times daily with a meal. 360 tablet 1   metoprolol succinate (TOPROL-XL) 25  MG 24 hr tablet Take 0.5 tablets (12.5 mg total) by mouth daily. 45 tablet 1   pantoprazole (PROTONIX) 40 MG tablet Take 1 tablet (40 mg total) by mouth 2 (two) times daily. 180 tablet 1   predniSONE (DELTASONE) 50 MG tablet Take (1) 50 mg tablet of prednisone 13 hours prior to your procedure at 8:30 pm.   Take (1) 50 mg tablet 7 hours prior to your procedure at 3:30 am.    Take (1) 50 mg tablet 1 hour prior to your procedure at 8:30 am . 3 tablet 0   Zinc 220 (50 Zn) MG CAPS Take 1 capsule (220 mg total) by mouth daily. 30 capsule 3   potassium chloride SA (KLOR-CON M) 20 MEQ tablet Take 2 tablets (40 mEq total) by mouth daily. 60 tablet 0   levETIRAcetam (KEPPRA) 500 MG tablet Take 1 tablet (500 mg total) by mouth 2 (two) times daily. 180 tablet 0   No facility-administered medications prior to visit.     ROS Review of Systems  Constitutional:  Negative for activity change and appetite change.  HENT:  Negative for sinus pressure and sore throat.   Respiratory:  Negative for chest tightness, shortness of breath and wheezing.   Cardiovascular:  Negative for chest pain and palpitations.  Gastrointestinal:  Negative for abdominal distention, abdominal pain and constipation.  Genitourinary: Negative.   Musculoskeletal: Negative.   Psychiatric/Behavioral:  Negative for behavioral problems and dysphoric mood.     Objective:  BP (!) 144/85    Pulse 80   Ht 5' 4.5" (1.638 m)   Wt 149 lb 6.4 oz (67.8 kg)   SpO2 100%   BMI 25.25 kg/m      08/19/2023    2:14 PM 08/19/2023    1:45 PM 05/13/2023    2:05 PM  BP/Weight  Systolic BP 144 153 130  Diastolic BP 85 86 84  Wt. (Lbs)  149.4 135  BMI  25.25 kg/m2 22.81 kg/m2      Physical Exam Constitutional:      Appearance: He is well-developed.  Cardiovascular:     Rate and Rhythm: Normal rate.     Heart sounds: Normal heart sounds. No murmur heard. Pulmonary:     Effort: Pulmonary effort is normal.     Breath sounds: Normal breath sounds. No wheezing or rales.  Chest:     Chest wall: No tenderness.  Abdominal:     General: Bowel sounds are normal. There is no distension.     Palpations: Abdomen is soft. There is no mass.     Tenderness: There is no abdominal tenderness.  Musculoskeletal:        General: Normal range of motion.     Right lower leg: No edema.     Left lower leg: No edema.  Neurological:     Mental Status: He is alert and oriented to person, place, and time.  Psychiatric:        Mood and Affect: Mood normal.        Latest Ref Rng & Units 04/28/2023    3:07 PM 04/25/2023   10:17 AM 04/02/2023    7:00 AM  CMP  Glucose 70 - 99 mg/dL 295  621  308   BUN 6 - 23 mg/dL 7  6  6    Creatinine 0.40 - 1.50 mg/dL 6.57  8.46  9.62   Sodium 135 - 145 mEq/L 131  135  131   Potassium 3.5 - 5.1 mEq/L 4.1  2.8  4.0   Chloride 96 - 112 mEq/L 96  93  102   CO2 19 - 32 mEq/L 26  29  22    Calcium 8.4 - 10.5 mg/dL 9.4  9.7  9.3   Total Protein 6.0 - 8.3 g/dL  8.8    Total Bilirubin 0.2 - 1.2 mg/dL  3.9    Alkaline Phos 39 - 117 U/L  184    AST 0 - 37 U/L  55    ALT 0 - 53 U/L  22      Lipid Panel     Component Value Date/Time   TRIG 120 07/09/2020 1240    CBC    Component Value Date/Time   WBC 6.2 04/25/2023 1017   RBC 4.73 04/25/2023 1017   HGB 12.1 (L) 04/25/2023 1017   HGB 10.7 (L) 10/16/2022 1226   HCT 38.0 (L) 04/25/2023 1017   HCT 31.3 (L)  10/16/2022 1226   PLT 93.0 (L) 04/25/2023 1017   PLT 87 (LL) 10/16/2022 1226   MCV 80.4 04/25/2023 1017   MCV 87 10/16/2022 1226   MCH 25.5 (L) 04/02/2023 0700   MCHC 31.9 04/25/2023 1017   RDW 25.9 (H) 04/25/2023 1017   RDW 19.8 (H) 10/16/2022 1226   LYMPHSABS 2.1 03/31/2023 1532   LYMPHSABS 1.3 10/16/2022 1226   MONOABS 0.9 03/31/2023 1532   EOSABS 0.1 03/31/2023 1532   EOSABS 0.0 10/16/2022 1226   BASOSABS 0.1 03/31/2023 1532   BASOSABS 0.1 10/16/2022 1226    Lab Results  Component Value Date   HGBA1C 8.3 (A) 07/11/2023       08/19/2023    1:46 PM 05/13/2023    2:07 PM 01/22/2023   11:27 AM 10/16/2022   10:13 AM 08/05/2022    3:24 PM  Depression screen PHQ 2/9  Decreased Interest 2 2 2 2 1   Down, Depressed, Hopeless 3 3 3 2 1   PHQ - 2 Score 5 5 5 4 2   Altered sleeping 3 2 3 2 2   Tired, decreased energy 2 2 2 2 1   Change in appetite 2 2 2 2 1   Feeling bad or failure about yourself  3 2 3 2  0  Trouble concentrating 3 2 2 1 2   Moving slowly or fidgety/restless 3 0 2 1 0  Suicidal thoughts  1 0 0 0  PHQ-9 Score 21 16 19 14 8     Assessment & Plan:      Liver Cirrhosis Patient has not been following up with GI specialist, for months. No recent changes in symptoms reported. Scleral icterus noted on physical exam. -Schedule appointment with GI for liver cirrhosis management. -Order blood work to assess liver function.  Pancreatic Cyst Patient has been seeing a Careers adviser for management. No recent changes in symptoms reported. -Continue current management plan with surgeon.  Type 2 Diabetes Mellitus A1c is 8.3, indicating improved but still suboptimal glycemic control. Patient reports running out of continuous glucose monitor supplies for about a week and a half. -Continue current insulin regimen (Lantus 30 units, Humalog 10 units). -Check blood glucose levels regularly with continuous glucose monitor. -Also being managed by the clinical pharmacy  team  Hypertension Blood pressure was elevated today, but was normal at last visit. -Recheck blood pressure today. -Continue current antihypertensive regimen.  Depression Patient reports increased depressive symptoms and irritability. Not currently taking prescribed Cymbalta. -Resume Cymbalta 60mg  daily for depression management. -Refused referral for psychotherapy  Hypokalemia Patient has been out of potassium supplements for  over a month. Currently on furosemide which can cause hypokalemia. -Refill potassium supplement prescription (40mg  daily). -Order labs to check potassium level.  Erectile Dysfunction Patient inquires about treatment for decreased libido. -Review current medications for potential interactions before prescribing treatment. -Placed on Viagra          Meds ordered this encounter  Medications   potassium chloride SA (KLOR-CON M) 20 MEQ tablet    Sig: Take 2 tablets (40 mEq total) by mouth daily.    Dispense:  60 tablet    Refill:  6   sildenafil (VIAGRA) 50 MG tablet    Sig: Take 1 tablet (50 mg total) by mouth daily as needed for erectile dysfunction.    Dispense:  10 tablet    Refill:  0    Follow-up: Return in about 3 months (around 11/16/2023) for Chronic medical conditions.       Hoy Register, MD, FAAFP. [facility] [place], [place] [phone]   08/19/2023, 5:37 PM

## 2023-08-20 ENCOUNTER — Other Ambulatory Visit: Payer: Self-pay

## 2023-08-21 LAB — CMP14+EGFR
ALT: 15 [IU]/L (ref 0–44)
AST: 50 [IU]/L — ABNORMAL HIGH (ref 0–40)
Albumin: 3.8 g/dL — ABNORMAL LOW (ref 4.1–5.1)
Alkaline Phosphatase: 169 [IU]/L — ABNORMAL HIGH (ref 44–121)
BUN/Creatinine Ratio: 10 (ref 9–20)
BUN: 6 mg/dL (ref 6–24)
Bilirubin Total: 2.8 mg/dL — ABNORMAL HIGH (ref 0.0–1.2)
CO2: 19 mmol/L — ABNORMAL LOW (ref 20–29)
Calcium: 8.7 mg/dL (ref 8.7–10.2)
Chloride: 102 mmol/L (ref 96–106)
Creatinine, Ser: 0.58 mg/dL — ABNORMAL LOW (ref 0.76–1.27)
Globulin, Total: 3.7 g/dL (ref 1.5–4.5)
Glucose: 193 mg/dL — ABNORMAL HIGH (ref 70–99)
Potassium: 4.5 mmol/L (ref 3.5–5.2)
Sodium: 135 mmol/L (ref 134–144)
Total Protein: 7.5 g/dL (ref 6.0–8.5)
eGFR: 119 mL/min/{1.73_m2} (ref >59)

## 2023-08-21 LAB — MICROALBUMIN / CREATININE URINE RATIO
Creatinine, Urine: 195.8 mg/dL
Microalb/Creat Ratio: 13 mg/g{creat} (ref 0–29)
Microalbumin, Urine: 26.3 ug/mL

## 2023-08-22 ENCOUNTER — Encounter: Payer: Self-pay | Admitting: Family Medicine

## 2023-08-29 ENCOUNTER — Other Ambulatory Visit: Payer: Self-pay

## 2023-08-29 ENCOUNTER — Other Ambulatory Visit: Payer: Self-pay | Admitting: Family Medicine

## 2023-08-29 DIAGNOSIS — E1169 Type 2 diabetes mellitus with other specified complication: Secondary | ICD-10-CM

## 2023-08-29 NOTE — Telephone Encounter (Signed)
 Copied from CRM [id number]. Topic: Clinical - Medication Refill >> Aug 29, 2023 11:40 AM Eunice Blase wrote: Most Recent Primary Care Visit:  Provider: Hoy Register  Department: [facility]  Visit Type: OFFICE VISIT  Date: 08/19/2023  Medication: insulin glargine (LANTUS SOLOSTAR) 100 UNIT/ML Solostar Pen  Has the patient contacted their pharmacy? Yes (Agent: If no, request that the patient contact the pharmacy for the refill. If patient does not wish to contact the pharmacy document the reason why and proceed with request.) (Agent: If yes, when and what did the pharmacy advise?)  Is this the correct pharmacy for this prescription? Yes If no, delete pharmacy and type the correct one.  This is the patient's preferred pharmacy:  [facility] [address], [address] [place] [address] Phone: [phone] Fax: [phone]   Has the prescription been filled recently? Yes  Is the patient out of the medication? Yes  Has the patient been seen for an appointment in the last year OR does the patient have an upcoming appointment? Yes  Can we respond through MyChart? Yes  Agent: Please be advised that Rx refills may take up to 3 business days. We ask that you follow-up with your pharmacy.

## 2023-09-01 ENCOUNTER — Other Ambulatory Visit: Payer: Self-pay

## 2023-09-01 MED ORDER — LANTUS SOLOSTAR 100 UNIT/ML ~~LOC~~ SOPN
30.0000 [IU] | PEN_INJECTOR | Freq: Every day | SUBCUTANEOUS | 0 refills | Status: DC
  Filled 2023-09-01: qty 15, 50d supply, fill #0

## 2023-09-01 NOTE — Telephone Encounter (Signed)
 Requested Prescriptions  Pending Prescriptions Disp Refills   insulin glargine (LANTUS SOLOSTAR) 100 UNIT/ML Solostar Pen 15 mL 0    Sig: Inject 30 Units into the skin daily.     Endocrinology:  Diabetes - Insulins Failed - 09/01/2023  8:20 AM      Failed - HBA1C is between 0 and 7.9 and within 180 days    HbA1c, POC (controlled diabetic range)  Date Value Ref Range Status  07/11/2023 8.3 (A) 0.0 - 7.0 % Final         Passed - Valid encounter within last 6 months    Recent Outpatient Visits           1 week ago Type 2 diabetes mellitus with other specified complication, with long-term current use of insulin (HCC)   [facility] - [facility] Of [place]. [facility] Hoy Register, MD   1 month ago Type 2 diabetes mellitus with other specified complication, with long-term current use of insulin (HCC)   [facility] [place] - A Dept Of [place]. [facility] Lois Huxley, [place] L, RPH-CPP   2 months ago Type 2 diabetes mellitus with other specified complication, with long-term current use of insulin (HCC)   [facility] - A Dept Of [place]. [facility] Lois Huxley, [place] L, RPH-CPP   3 months ago Chronic left shoulder pain   [facility] - A Dept Of [place]. [facility] Hoy Register, MD   4 months ago Long term (current) use of insulin [facility])   [facility] [place] - A Dept Of [place]. [facility] Drucilla Chalet, RPH-CPP       Future Appointments             In 1 week Lois Huxley, Cornelius Moras, RPH-CPP [facility] - A Dept Of [place]. [facility]

## 2023-09-05 ENCOUNTER — Other Ambulatory Visit: Payer: Self-pay

## 2023-09-06 DIAGNOSIS — Z419 Encounter for procedure for purposes other than remedying health state, unspecified: Secondary | ICD-10-CM | POA: Diagnosis not present

## 2023-09-12 ENCOUNTER — Encounter: Payer: Self-pay | Admitting: Pharmacist

## 2023-09-12 ENCOUNTER — Ambulatory Visit: Payer: 59 | Attending: Nurse Practitioner | Admitting: Pharmacist

## 2023-09-12 DIAGNOSIS — Z794 Long term (current) use of insulin: Secondary | ICD-10-CM | POA: Diagnosis not present

## 2023-09-12 DIAGNOSIS — E1169 Type 2 diabetes mellitus with other specified complication: Secondary | ICD-10-CM

## 2023-09-12 DIAGNOSIS — Z7689 Persons encountering health services in other specified circumstances: Secondary | ICD-10-CM | POA: Diagnosis not present

## 2023-09-12 NOTE — Progress Notes (Signed)
 S:    53 y.o. male who presents for diabetes evaluation, education, and management. PMH is significant for T2DM, hx of a fib w/ prior CVA, hx of acute pancreatitis secondary to alcohol use, GERD, alcoholic cirrhosis of the liver, esophageal varies. No known CAD, ACS, or other clinical ASCVD hx. No CHF or CKD hx.   Patient was referred and last seen by Primary Care Provider, Dr. Alvis Lemmings, on 08/19/2023. Pharmacy last saw him on 07/11/2023. A1c was minimally improved down from 8.6 to 8.3%. I had no CGM data at that time, so I refilled his sensors and made no changes to insulin.   Today, patient presents to clinic in good spirits. He endorses adherence to medications as prescribed. He endorses s/sx of hyperglycemia including neuropathy and blurred vision. He has trouble with his sensors coming off. Of note, he does have a hx of chronic alcohol use and does not eat regularly so is prone to hypoglycemia.  Family/Social History:  Fhx: DM Tobacco: former smoker (quit in 2019) Alcohol: no current use reported and patient denies    Current diabetes medications include: Lantus 30 units daily, metformin 500 mg tabs - takes 2 tablets (1000mg ) BID, Humalog 10 units TID with meals Patient reports adherence to taking all medications as prescribed.   Insurance coverage: Managed Medicaid   Patient denies hypoglycemic events.  Patient endorses nocturia (nighttime urination).  Patient denies neuropathy (nerve pain). Patient denies visual changes. Patient reports self foot exams.   Patient reported dietary habits: Eats 2-3 meals/day - Breakfast: whatever is available, sausage, cherry turnover, chicken broth - Dinner: whatever is available, [place], mashed potatoes, mac n cheese - Drinks: mostly water  Patient-reported exercise habits: none  O:  Date of Download: 09/12/2023; 7-day data % Time CGM is active: 100% Average Glucose: 154 mg/dL Glucose Management Indicator: NA  Glucose Variability: na (goal  <36%) Time in Goal:  - Time in range 70-180: 62% - Time above range: 31% - Time below range: 7% Observed patterns: -Hypoglycemia occurs overnight or later in the day between 6p-12a.   Lab Results  Component Value Date   HGBA1C 8.3 (A) 07/11/2023   There were no vitals filed for this visit.  Lipid Panel     Component Value Date/Time   TRIG 120 07/09/2020 1240   Clinical Atherosclerotic Cardiovascular Disease (ASCVD): Yes  The ASCVD Risk score (Arnett DK, et al., 2019) failed to calculate for the following reasons:   Risk score cannot be calculated because patient has a medical history suggesting prior/existing ASCVD   Patient is participating in a Managed Medicaid Plan: Yes   A/P: Diabetes longstanding, currently uncontrolled. Since last visit, he has experienced some lows. He has a hx of alcohol use and labile blood sugars. Patient is able to verbalize appropriate hypoglycemia management plan. Medication adherence appears okay. His 7-day sugar avg looks good but is influenced by hypoglycemia. I have instructed him to skip his evening Humalog if he does not eat or if his pre-prandial blood sugar is <100 mg/dL.  -Continue Lantus 30 units daily -Continue Humalog 10 units TID - before meals -Continue metformin 500 mg - 2 tablets (1000mg ) twice daily -Patient educated on purpose, proper use, and potential adverse effects of medications.  -Extensively discussed pathophysiology of diabetes, recommended lifestyle interventions, dietary effects on blood sugar control.  -Counseled on s/sx of and management of hypoglycemia.  -Next A1c anticipated 10/2023.   Written patient instructions provided. Patient verbalized understanding of treatment plan.  Total time  in face to face counseling 30 minutes.    Follow-up:  PCP clinic visit on 08/19/2023 Me in March.  [name], PharmD, [place], CPP Clinical Pharmacist [facility] [phone]

## 2023-09-15 ENCOUNTER — Other Ambulatory Visit: Payer: Self-pay

## 2023-09-17 DIAGNOSIS — Z7689 Persons encountering health services in other specified circumstances: Secondary | ICD-10-CM | POA: Diagnosis not present

## 2023-09-19 ENCOUNTER — Other Ambulatory Visit: Payer: Self-pay

## 2023-09-25 ENCOUNTER — Encounter: Payer: Self-pay | Admitting: Gastroenterology

## 2023-10-06 DIAGNOSIS — H179 Unspecified corneal scar and opacity: Secondary | ICD-10-CM | POA: Diagnosis not present

## 2023-10-06 DIAGNOSIS — H5213 Myopia, bilateral: Secondary | ICD-10-CM | POA: Diagnosis not present

## 2023-10-06 DIAGNOSIS — H40013 Open angle with borderline findings, low risk, bilateral: Secondary | ICD-10-CM | POA: Diagnosis not present

## 2023-10-06 DIAGNOSIS — H35033 Hypertensive retinopathy, bilateral: Secondary | ICD-10-CM | POA: Diagnosis not present

## 2023-10-06 DIAGNOSIS — E119 Type 2 diabetes mellitus without complications: Secondary | ICD-10-CM | POA: Diagnosis not present

## 2023-10-06 DIAGNOSIS — H2513 Age-related nuclear cataract, bilateral: Secondary | ICD-10-CM | POA: Diagnosis not present

## 2023-10-06 LAB — HM DIABETES EYE EXAM

## 2023-10-14 ENCOUNTER — Other Ambulatory Visit: Payer: Self-pay

## 2023-10-14 ENCOUNTER — Ambulatory Visit: Payer: Self-pay | Attending: Family Medicine | Admitting: Pharmacist

## 2023-10-14 ENCOUNTER — Encounter: Payer: Self-pay | Admitting: Pharmacist

## 2023-10-14 ENCOUNTER — Telehealth: Payer: Self-pay | Admitting: Pharmacist

## 2023-10-14 DIAGNOSIS — Z794 Long term (current) use of insulin: Secondary | ICD-10-CM | POA: Diagnosis not present

## 2023-10-14 DIAGNOSIS — Z7984 Long term (current) use of oral hypoglycemic drugs: Secondary | ICD-10-CM | POA: Diagnosis not present

## 2023-10-14 DIAGNOSIS — E1169 Type 2 diabetes mellitus with other specified complication: Secondary | ICD-10-CM | POA: Diagnosis not present

## 2023-10-14 DIAGNOSIS — Z7689 Persons encountering health services in other specified circumstances: Secondary | ICD-10-CM | POA: Diagnosis not present

## 2023-10-14 LAB — POCT GLYCOSYLATED HEMOGLOBIN (HGB A1C): HbA1c, POC (controlled diabetic range): 8.6 % — AB (ref 0.0–7.0)

## 2023-10-14 MED ORDER — EMPAGLIFLOZIN 10 MG PO TABS
10.0000 mg | ORAL_TABLET | Freq: Every day | ORAL | 3 refills | Status: DC
  Filled 2023-10-14: qty 30, 30d supply, fill #0

## 2023-10-14 NOTE — Telephone Encounter (Signed)
 Hey friend,   Can we start a PA for this patient's Jardiance?

## 2023-10-14 NOTE — Progress Notes (Signed)
 S:    53 y.o. male who presents for diabetes evaluation, education, and management. PMH is significant for T2DM, hx of a fib w/ prior CVA, hx of acute pancreatitis secondary to alcohol use, GERD, alcoholic cirrhosis of the liver, esophageal varies. No known CAD, ACS, or other clinical ASCVD hx. No CHF or CKD hx.   Patient was referred and last seen by Primary Care Provider, Dr. Alvis Lemmings, on 08/19/2023. Pharmacy last saw him on 09/12/2023. CGM data looked okay but he had some hypoglycemia. I instructed him to hold Humalog if pre-prandial glucose was <100 mg/dL or if he skips meals. Overall, however, we made no changes with his regimen.  Today, patient presents to clinic in good spirits. He endorses adherence to medications as prescribed. He endorses s/sx of hyperglycemia including neuropathy and blurred vision. He has trouble with his sensors coming off - requests refills today. He does not have his receiver with him so I have no blood sugar readings to review today. Of note, he does have a hx of chronic alcohol use and does not eat regularly so is prone to hypoglycemia.  Family/Social History:  Fhx: DM Tobacco: former smoker (quit in 2019) Alcohol: no current use reported and patient denies    Current diabetes medications include: Lantus 30 units daily, metformin 500 mg tabs - takes 2 tablets (1000mg ) BID, Humalog 10 units TID with meals Patient reports adherence to taking all medications as prescribed.   Insurance coverage: Managed Medicaid   Patient denies hypoglycemic events.  Patient endorses nocturia (nighttime urination).  Patient denies neuropathy (nerve pain). Patient denies visual changes. Patient reports self foot exams.   Patient reported dietary habits: Eats 2-3 meals/day - Protein: chicken, tuna - usually canned  - Usually eats vegetables: names corn, tomatoes, okra, brussels sprouts  - Drinks: mostly water - denies soda, juice.   Patient-reported exercise habits:  -Tries  to walk ~2-3x weekly (at least 1 mile)  O:  No CGM present. Tells me today he is out of sensors.   Lab Results  Component Value Date   HGBA1C 8.6 (A) 10/14/2023   There were no vitals filed for this visit.  Lipid Panel     Component Value Date/Time   TRIG 120 07/09/2020 1240   Clinical Atherosclerotic Cardiovascular Disease (ASCVD): Yes  The ASCVD Risk score (Arnett DK, et al., 2019) failed to calculate for the following reasons:   Risk score cannot be calculated because patient has a medical history suggesting prior/existing ASCVD   Patient is participating in a Managed Medicaid Plan: Yes   A/P: Diabetes longstanding, currently uncontrolled. A1c today is up from 8.3 to 8.6%. No home data to review today. He has a hx of alcohol use and labile blood sugars. Patient is able to verbalize appropriate hypoglycemia management plan. Medication adherence appears okay. I hesitate to adjust insulin - we will add Jardiance instead and have him follow-up with his PCP. Me in 2 months. -Continue Lantus 30 units daily -Continue Humalog 10 units TID - before meals -Continue metformin 500 mg - 2 tablets (1000mg ) twice daily -Add Jardiance 10 mg daily. Message sent to pharmacy for PA. -Patient educated on purpose, proper use, and potential adverse effects of medications.  -Extensively discussed pathophysiology of diabetes, recommended lifestyle interventions, dietary effects on blood sugar control.  -Counseled on s/sx of and management of hypoglycemia.  -Next A1c anticipated 01/2024.   Written patient instructions provided. Patient verbalized understanding of treatment plan.  Total time in face to  face counseling 30 minutes.    Follow-up:  PCP clinic visit next month Me: 2 months  [name], PharmD, [place], CPP Clinical Pharmacist [facility] [phone]

## 2023-10-15 ENCOUNTER — Other Ambulatory Visit: Payer: Self-pay

## 2023-10-15 ENCOUNTER — Telehealth: Payer: Self-pay

## 2023-10-15 NOTE — Telephone Encounter (Signed)
 Pharmacy Patient Advocate Encounter  Received notification from [facility] Medicaid that Prior Authorization for JARDIANCE has been APPROVED from 10/15/2023 to 10/14/2024   PA #/Case ID/Reference #: [id number]

## 2023-10-18 DIAGNOSIS — Z419 Encounter for procedure for purposes other than remedying health state, unspecified: Secondary | ICD-10-CM | POA: Diagnosis not present

## 2023-10-20 ENCOUNTER — Emergency Department (HOSPITAL_COMMUNITY)

## 2023-10-20 ENCOUNTER — Emergency Department (HOSPITAL_COMMUNITY)
Admission: EM | Admit: 2023-10-20 | Discharge: 2023-10-20 | Disposition: A | Discharge status: Home / Self Care | Attending: Emergency Medicine | Admitting: Emergency Medicine

## 2023-10-20 ENCOUNTER — Other Ambulatory Visit: Payer: Self-pay

## 2023-10-20 ENCOUNTER — Encounter (HOSPITAL_COMMUNITY): Payer: Self-pay

## 2023-10-20 DIAGNOSIS — S0231XA Fracture of orbital floor, right side, initial encounter for closed fracture: Secondary | ICD-10-CM | POA: Diagnosis not present

## 2023-10-20 DIAGNOSIS — R569 Unspecified convulsions: Secondary | ICD-10-CM | POA: Diagnosis not present

## 2023-10-20 DIAGNOSIS — Y908 Blood alcohol level of 240 mg/100 ml or more: Secondary | ICD-10-CM | POA: Insufficient documentation

## 2023-10-20 DIAGNOSIS — I6782 Cerebral ischemia: Secondary | ICD-10-CM | POA: Diagnosis not present

## 2023-10-20 DIAGNOSIS — E119 Type 2 diabetes mellitus without complications: Secondary | ICD-10-CM | POA: Diagnosis not present

## 2023-10-20 DIAGNOSIS — F10920 Alcohol use, unspecified with intoxication, uncomplicated: Secondary | ICD-10-CM | POA: Insufficient documentation

## 2023-10-20 DIAGNOSIS — R4182 Altered mental status, unspecified: Secondary | ICD-10-CM | POA: Diagnosis not present

## 2023-10-20 DIAGNOSIS — R9431 Abnormal electrocardiogram [ECG] [EKG]: Secondary | ICD-10-CM | POA: Diagnosis not present

## 2023-10-20 LAB — COMPREHENSIVE METABOLIC PANEL WITH GFR
ALT: 17 U/L (ref 0–44)
AST: 33 U/L (ref 15–41)
Albumin: 3 g/dL — ABNORMAL LOW (ref 3.5–5.0)
Alkaline Phosphatase: 120 U/L (ref 38–126)
Anion gap: 9 (ref 5–15)
BUN: 14 mg/dL (ref 6–20)
CO2: 22 mmol/L (ref 22–32)
Calcium: 8.6 mg/dL — ABNORMAL LOW (ref 8.9–10.3)
Chloride: 111 mmol/L (ref 98–111)
Creatinine, Ser: 0.66 mg/dL (ref 0.61–1.24)
GFR, Estimated: >60 mL/min (ref >60)
Glucose, Bld: 424 mg/dL — ABNORMAL HIGH (ref 70–99)
Potassium: 4.3 mmol/L (ref 3.5–5.1)
Sodium: 142 mmol/L (ref 135–145)
Total Bilirubin: 1.1 mg/dL (ref 0.0–1.2)
Total Protein: 7.3 g/dL (ref 6.5–8.1)

## 2023-10-20 LAB — CBC WITH DIFFERENTIAL/PLATELET
Abs Immature Granulocytes: 0.01 10*3/uL (ref 0.00–0.07)
Basophils Absolute: 0.1 10*3/uL (ref 0.0–0.1)
Basophils Relative: 1 %
Eosinophils Absolute: 0.1 10*3/uL (ref 0.0–0.5)
Eosinophils Relative: 1 %
HCT: 35.3 % — ABNORMAL LOW (ref 39.0–52.0)
Hemoglobin: 10.6 g/dL — ABNORMAL LOW (ref 13.0–17.0)
Immature Granulocytes: 0 %
Lymphocytes Relative: 40 %
Lymphs Abs: 2.7 10*3/uL (ref 0.7–4.0)
MCH: 24.8 pg — ABNORMAL LOW (ref 26.0–34.0)
MCHC: 30 g/dL (ref 30.0–36.0)
MCV: 82.7 fL (ref 80.0–100.0)
Monocytes Absolute: 0.9 10*3/uL (ref 0.1–1.0)
Monocytes Relative: 14 %
Neutro Abs: 3.1 10*3/uL (ref 1.7–7.7)
Neutrophils Relative %: 44 %
Platelets: 98 10*3/uL — ABNORMAL LOW (ref 150–400)
RBC: 4.27 MIL/uL (ref 4.22–5.81)
RDW: 25.2 % — ABNORMAL HIGH (ref 11.5–15.5)
WBC: 6.9 10*3/uL (ref 4.0–10.5)
nRBC: 0 % (ref 0.0–0.2)

## 2023-10-20 LAB — ETHANOL: Alcohol, Ethyl (B): 328 mg/dL (ref <10)

## 2023-10-20 LAB — RAPID URINE DRUG SCREEN, HOSP PERFORMED
Amphetamines: NOT DETECTED
Barbiturates: NOT DETECTED
Benzodiazepines: NOT DETECTED
Cocaine: NOT DETECTED
Opiates: NOT DETECTED
Tetrahydrocannabinol: NOT DETECTED

## 2023-10-20 LAB — BLOOD GAS, VENOUS
Acid-base deficit: 2.9 mmol/L — ABNORMAL HIGH (ref 0.0–2.0)
Bicarbonate: 23.7 mmol/L (ref 20.0–28.0)
O2 Saturation: 68.6 %
Patient temperature: 37
pCO2, Ven: 47 mmHg (ref 44–60)
pH, Ven: 7.31 (ref 7.25–7.43)
pO2, Ven: 45 mmHg (ref 32–45)

## 2023-10-20 LAB — CBG MONITORING, ED
Glucose-Capillary: 447 mg/dL — ABNORMAL HIGH (ref 70–99)
Glucose-Capillary: 248 mg/dL — ABNORMAL HIGH (ref 70–99)

## 2023-10-20 LAB — MAGNESIUM: Magnesium: 1.7 mg/dL (ref 1.7–2.4)

## 2023-10-20 MED ORDER — INSULIN ASPART 100 UNIT/ML IJ SOLN
10.0000 [IU] | Freq: Once | INTRAMUSCULAR | Status: AC
  Administered 2023-10-20: 10 [IU] via INTRAVENOUS
  Filled 2023-10-20: qty 0.1

## 2023-10-20 MED ORDER — SODIUM CHLORIDE 0.9 % IV BOLUS
1000.0000 mL | Freq: Once | INTRAVENOUS | Status: AC
  Administered 2023-10-20: 1000 mL via INTRAVENOUS

## 2023-10-20 NOTE — ED Provider Notes (Signed)
 Sz d/o, Etoh. F/U CT head. If back to baseline after Etoh clears, anticipate d/c. Physical Exam  BP 119/70   Pulse 81   Temp (!) 97.5 F (36.4 C) (Oral)   Resp 15   SpO2 98%   Physical Exam  Procedures  Procedures  ED Course / MDM    Medical Decision Making Amount and/or Complexity of Data Reviewed Labs: ordered. Radiology: ordered.  Risk Prescription drug management.   CT head was significantly delayed.  [name] is returned and no acute findings.  I have reassessed the patient.  He awakens to light voice.  He reports he does not remember what happened and why he did not end up getting home last night.  He reports he feels fine now.  He follows commands to move each extremity independently.  No focal deficits.  Stable for discharge.       Wynetta Heckle, MD 10/20/23 1122

## 2023-10-20 NOTE — ED Triage Notes (Signed)
 Patient arrived via ems after a bystander called that he was on the side of the road with no pants on. Blood sugar reading 557. A&o x4. Admits to ETOH. Hx of seizures and diabetes.

## 2023-10-20 NOTE — Discharge Instructions (Signed)
 1.  Continue to manage your diabetes and follow-up closely with your primary doctor. 2.  Alcohol use will lead to many medical complications and constant risk for injury or death.  Seek treatment for alcohol dependence.

## 2023-10-20 NOTE — ED Notes (Signed)
 Prior nurse found bed bug on patient. Patient's clothing double bagged. Patient taken with sheet to bathroom. Assisted patient in washing head to toe with body wash and chlorhexidine wipes. Linen placed in bag, scrubs placed on patient with deodorant, and new slip resistance socks. Placed patient in another room so room could be [place].

## 2023-10-20 NOTE — ED Notes (Signed)
 Patient transported to CT

## 2023-10-20 NOTE — ED Provider Notes (Signed)
 Emergency Department Provider Note   I have reviewed the triage vital signs and the nursing notes.   HISTORY  Chief Complaint Alcohol Intoxication and Hyperglycemia   HPI Gary Mitchell is a 53 y.o. male with past history reviewed below including diabetes and EtOH abuse presents to the emergency department after being found on the side of the road without pants.  Patient does not recall having a seizure but states he does have them fairly frequently.  He reports being compliant with his home medications including Keppra.  He denies drinking alcohol this evening but states he would have if he could have gotten a hold of it.  His last drink was apparently 4 to 5 days ago. Denies other drug use. No tremors. Denies CP, vomiting, or diarrhea.   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 Respiratory: Denies shortness of breath. Gastrointestinal: No abdominal pain.  Skin: Negative for rash. Neurological: Negative for headaches, focal weakness or numbness.  ____________________________________________   PHYSICAL EXAM:  VITAL SIGNS: Vitals:   10/20/23 1100 10/20/23 1151  BP: 128/85 122/80  Pulse: 89 90  Resp: 12 16  Temp:  98.4 F (36.9 C)  SpO2: 99% 100%   Constitutional: Alert and oriented. Well appearing and in no  acute distress. Eyes: Conjunctivae are normal.  Head: Atraumatic. Nose: No congestion/rhinnorhea. Mouth/Throat: Mucous membranes are slightly dry.  Neck: No stridor.   Cardiovascular: Normal rate, regular rhythm. Good peripheral circulation. Grossly normal heart sounds.   Respiratory: Normal respiratory effort.  No retractions. Lungs CTAB. Gastrointestinal: Soft and nontender. No distention.  Musculoskeletal: No lower extremity tenderness nor edema. No gross deformities of extremities. Neurologic:  Normal speech and language. No gross focal neurologic deficits are appreciated.  Skin:  Skin is warm, dry and intact. No rash noted.  ____________________________________________   LABS (all labs ordered are listed, but only abnormal results are displayed)  Labs Reviewed  CBC WITH DIFFERENTIAL/PLATELET - Abnormal; Notable for the following components:      Result Value   Hemoglobin 10.6 (*)    HCT 35.3 (*)    MCH 24.8 (*)    RDW 25.2 (*)    Platelets 98 (*)    All other components within normal limits  BLOOD GAS, VENOUS - Abnormal; Notable for the following components:   Acid-base deficit 2.9 (*)    All other components within normal limits  ETHANOL - Abnormal; Notable for the following components:   Alcohol, Ethyl (B) 328 (*)    All other components within normal limits  COMPREHENSIVE METABOLIC PANEL WITH GFR - Abnormal; Notable for the following components:   Glucose, Bld 424 (*)    Calcium 8.6 (*)    Albumin 3.0 (*)    All other components within normal limits  CBG  MONITORING, ED - Abnormal; Notable for the following components:   Glucose-Capillary 447 (*)    All other components within normal limits  CBG MONITORING, ED - Abnormal; Notable for the following components:   Glucose-Capillary 248 (*)    All other components within normal limits  RAPID URINE DRUG SCREEN, HOSP PERFORMED  MAGNESIUM  CBG MONITORING, ED   ____________________________________________  EKG   EKG  Interpretation Date/Time:  Monday October 20 2023 05:26:55 EDT Ventricular Rate:  76 PR Interval:  166 QRS Duration:  89 QT Interval:  409 QTC Calculation: 460 R Axis:   35  Text Interpretation: Sinus rhythm Nonspecific T abnormalities, inferior leads Confirmed by Abby Hocking [phone]) on 10/20/2023 5:41:16 AM        ____________________________________________   PROCEDURES  Procedure(s) performed:   Procedures  None  ____________________________________________   INITIAL IMPRESSION / ASSESSMENT AND PLAN / ED COURSE  Pertinent labs & imaging results that were available during my care of the patient were reviewed by me and considered in my medical decision making (see chart for details).   This patient is Presenting for Evaluation of AMS, which does require a range of treatment options, and is a complaint that involves a high risk of morbidity and mortality.  The Differential Diagnoses includes but is not exclusive to alcohol, illicit or prescription medications, intracranial pathology such as stroke, intracerebral hemorrhage, fever or infectious causes including sepsis, hypoxemia, uremia, trauma, endocrine related disorders such as diabetes, hypoglycemia, thyroid-related diseases, etc.   Critical Interventions-    Medications  sodium chloride 0.9 % bolus 1,000 mL (0 mLs Intravenous Stopped 10/20/23 0609)  insulin aspart (novoLOG) injection 10 Units (10 Units Intravenous Given 10/20/23 0607)    Reassessment after intervention: mental status improving    I did obtain Additional Historical Information from EMS.  Clinical Laboratory Tests Ordered, included EtOH > 300. No AKI. Normal electrolytes. No leukocytosis.   Radiologic Tests Ordered, included CT head. I independently interpreted the images and agree with radiology interpretation.   Cardiac Monitor Tracing which shows NSR.    Social Determinants of Health Risk patient is a history of EtOH use.   Medical Decision  Making: Summary:  Patient presents emergency department with altered mental status.  Question seizure at bedside.  Patient is awake, alert, conversational.  He does not appear intoxicated to me.  Appears to be at his mental status baseline. No obvious sign of EtOH withdrawal symptoms.   Reevaluation with update and discussion with patient. Care transferred to Dr. Daivd Dub pending CT head and re-evaluation.   Patient's presentation is most consistent with acute presentation with potential threat to life or bodily function.   Disposition: pending  ____________________________________________  FINAL CLINICAL IMPRESSION(S) / ED DIAGNOSES  Final diagnoses:  Alcoholic intoxication without complication (HCC)  Altered mental status, unspecified altered mental status type    Note:  This document was prepared using Dragon voice recognition software and may include unintentional dictation errors.  Abby Hocking, MD, [facility] Emergency Medicine    Jaxn Chiquito, Shereen Dike, MD 10/23/23 [phone]

## 2023-10-20 NOTE — ED Notes (Signed)
 This nurse called lab to inquire about CMP, magnesium and ethanol orders being discontinued x2. Lab staff reported labs being hemolyzed x2 and could not be used. This nurse collected blood samples and sent to lab for a 3rd time. Dr. Dolan Freiberg notified of reasoning for delay.

## 2023-10-25 DIAGNOSIS — Z7689 Persons encountering health services in other specified circumstances: Secondary | ICD-10-CM | POA: Diagnosis not present

## 2023-11-06 ENCOUNTER — Other Ambulatory Visit: Payer: Self-pay

## 2023-11-06 ENCOUNTER — Other Ambulatory Visit: Payer: Self-pay | Admitting: Family Medicine

## 2023-11-06 DIAGNOSIS — Z794 Long term (current) use of insulin: Secondary | ICD-10-CM

## 2023-11-06 MED ORDER — LANTUS SOLOSTAR 100 UNIT/ML ~~LOC~~ SOPN
30.0000 [IU] | PEN_INJECTOR | Freq: Every day | SUBCUTANEOUS | 1 refills | Status: DC
  Filled 2023-11-06: qty 27, 90d supply, fill #0

## 2023-11-07 ENCOUNTER — Other Ambulatory Visit: Payer: Self-pay

## 2023-11-13 ENCOUNTER — Other Ambulatory Visit: Payer: Self-pay | Admitting: Family Medicine

## 2023-11-13 DIAGNOSIS — N529 Male erectile dysfunction, unspecified: Secondary | ICD-10-CM

## 2023-11-13 NOTE — Telephone Encounter (Signed)
 Copied from CRM [id number]. Topic: Clinical - Medication Refill >> Nov 13, 2023 10:22 AM Phil Braun wrote: Medication: sildenafil  (VIAGRA ) 50 MG tablet   Has the patient contacted their pharmacy? Yes   This is the patient's preferred pharmacy:  [facility] [address] [place] [address] Phone: [phone] Fax: [phone]   Is this the correct pharmacy for this prescription? Yes If no, delete pharmacy and type the correct one.   Has the prescription been filled recently? Yes  Is the patient out of the medication? Yes  Has the patient been seen for an appointment in the last year OR does the patient have an upcoming appointment? Yes  Can we respond through MyChart? No  Agent: Please be advised that Rx refills may take up to 3 business days. We ask that you follow-up with your pharmacy.

## 2023-11-17 ENCOUNTER — Encounter: Payer: Self-pay | Admitting: Family Medicine

## 2023-11-17 ENCOUNTER — Ambulatory Visit: Payer: Self-pay | Attending: Family Medicine | Admitting: Family Medicine

## 2023-11-17 ENCOUNTER — Other Ambulatory Visit: Payer: Self-pay

## 2023-11-17 VITALS — BP 126/80 | HR 77 | Ht 64.0 in | Wt 148.0 lb

## 2023-11-17 DIAGNOSIS — R569 Unspecified convulsions: Secondary | ICD-10-CM | POA: Diagnosis not present

## 2023-11-17 DIAGNOSIS — F3289 Other specified depressive episodes: Secondary | ICD-10-CM

## 2023-11-17 DIAGNOSIS — Z794 Long term (current) use of insulin: Secondary | ICD-10-CM

## 2023-11-17 DIAGNOSIS — E1169 Type 2 diabetes mellitus with other specified complication: Secondary | ICD-10-CM

## 2023-11-17 DIAGNOSIS — F32A Depression, unspecified: Secondary | ICD-10-CM | POA: Insufficient documentation

## 2023-11-17 DIAGNOSIS — K7030 Alcoholic cirrhosis of liver without ascites: Secondary | ICD-10-CM | POA: Diagnosis not present

## 2023-11-17 DIAGNOSIS — Z7984 Long term (current) use of oral hypoglycemic drugs: Secondary | ICD-10-CM | POA: Diagnosis not present

## 2023-11-17 DIAGNOSIS — Z419 Encounter for procedure for purposes other than remedying health state, unspecified: Secondary | ICD-10-CM | POA: Diagnosis not present

## 2023-11-17 DIAGNOSIS — Z7689 Persons encountering health services in other specified circumstances: Secondary | ICD-10-CM | POA: Diagnosis not present

## 2023-11-17 MED ORDER — EMPAGLIFLOZIN 10 MG PO TABS
10.0000 mg | ORAL_TABLET | Freq: Every day | ORAL | 1 refills | Status: DC
  Filled 2023-11-17 – 2023-12-15 (×2): qty 90, 90d supply, fill #0

## 2023-11-17 MED ORDER — SILDENAFIL CITRATE 50 MG PO TABS
50.0000 mg | ORAL_TABLET | Freq: Every day | ORAL | 1 refills | Status: DC | PRN
  Filled 2023-11-17: qty 10, 10d supply, fill #0
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
## 2023-11-17 NOTE — Telephone Encounter (Signed)
 Requested Prescriptions  Pending Prescriptions Disp Refills   sildenafil  (VIAGRA ) 50 MG tablet 10 tablet 1    Sig: Take 1 tablet (50 mg total) by mouth daily as needed for erectile dysfunction.     Urology: Erectile Dysfunction Agents Passed - 11/17/2023  7:55 AM      Passed - AST in normal range and within 360 days    AST  Date Value Ref Range Status  10/20/2023 33 15 - 41 U/L Final         Passed - ALT in normal range and within 360 days    ALT  Date Value Ref Range Status  10/20/2023 17 0 - 44 U/L Final         Passed - Last BP in normal range    BP Readings from Last 1 Encounters:  10/20/23 122/80         Passed - Valid encounter within last 12 months    Recent Outpatient Visits           1 month ago Type 2 diabetes mellitus with other specified complication, with long-term current use of insulin  (HCC)   [facility] [place]. [facility] Freada Jacobs, [place] L, RPH-CPP   2 months ago Type 2 diabetes mellitus with other specified complication, with long-term current use of insulin  [facility])   [facility] Of [place]. [facility] Freada Jacobs, [place] L, RPH-CPP   3 months ago Type 2 diabetes mellitus with other specified complication, with long-term current use of insulin  [facility])   [facility] - [facility] Dept Of [place]. [facility] Joaquin Mulberry, MD   4 months ago Type 2 diabetes mellitus with other specified complication, with long-term current use of insulin  [facility])   [facility] A Dept Of [place]. [facility] Freada Jacobs, [place] L, RPH-CPP   5 months ago Type 2 diabetes mellitus with other specified complication, with long-term current use of insulin  [facility])    [facility] Of [place]. [facility] Freada Jacobs, [place], RPH-CPP       Future Appointments             Today Joaquin Mulberry, MD [facility] - A Dept Of [place]. [facility]
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
## 2023-11-18 ENCOUNTER — Telehealth: Payer: Self-pay

## 2023-11-18 DIAGNOSIS — K862 Cyst of pancreas: Secondary | ICD-10-CM

## 2023-11-18 NOTE — Telephone Encounter (Signed)
 Left message on machine to call back

## 2023-11-18 NOTE — Telephone Encounter (Signed)
-----   Message from Nurse Christifer Chapdelaine P sent at 05/21/2023 12:36 PM EST -----  CT abdomen scan in 6 months (this will also be his HCC screening)

## 2023-11-19 NOTE — Telephone Encounter (Signed)
 Left message on machine to call back

## 2023-11-19 NOTE — Telephone Encounter (Signed)
 The pt called back and agrees to the CT scan. Order has been entered and sent to the schedulers

## 2023-11-22 DIAGNOSIS — R404 Transient alteration of awareness: Secondary | ICD-10-CM | POA: Diagnosis not present

## 2023-11-22 DIAGNOSIS — S0990XA Unspecified injury of head, initial encounter: Secondary | ICD-10-CM | POA: Diagnosis not present

## 2023-11-22 DIAGNOSIS — Z743 Need for continuous supervision: Secondary | ICD-10-CM | POA: Diagnosis not present

## 2023-11-26 ENCOUNTER — Other Ambulatory Visit: Payer: Self-pay

## 2023-12-02 ENCOUNTER — Telehealth: Payer: Self-pay | Admitting: Family Medicine

## 2023-12-02 NOTE — Telephone Encounter (Signed)
 Copied from CRM [id number]. Topic: Clinical - Request for Lab/Test Order >> Dec 02, 2023  3:40 PM Essie A wrote:  Reason for CRM: Patient was told through MyChart that he needs lab work done by the end of the month.  He will need a lab order in place.  He has an appt at [facility] on Thursday and wants to get the labs done there.  Please call him to let him know if the order will be ready at [phone].

## 2023-12-03 ENCOUNTER — Telehealth: Payer: Self-pay

## 2023-12-03 ENCOUNTER — Other Ambulatory Visit: Payer: Self-pay

## 2023-12-03 MED ORDER — PREDNISONE 50 MG PO TABS
ORAL_TABLET | ORAL | 0 refills | Status: DC
  Filled 2023-12-03: qty 3, 3d supply, fill #0

## 2023-12-03 NOTE — Telephone Encounter (Signed)
 Gary Mitchell, dob August 03, 2070 scheduled for ct abd with contrast tomorrow /12/04/23, patient has contrast dye allergy and will need 13 hr prep. thanks - vanda     Your records indicate that you have an allergy/sensitivity to one or more components within IV contrast dye. We have sent a prescription of Prednisone  50 mg (3 tablets) and Benadryl  50 mg (1 tablet) to your pharmacy as a pre-mediation for your contrasted procedure which should prevent any reaction from occurring.  Take (1) 50 mg tablet of prednisone  13 hours prior to your procedure at 930 pm 12/03/23.   Take (1) 50 mg tablet of prednisone  7 hours prior to your procedure at 3:30 am 12/04/23.    Take (1) 50 mg tablet of prednisone  and (1) 50 mg tablet of Benadryl  1 hour prior to your procedure at 930 am 12/04/23.      Left message on machine to call back

## 2023-12-03 NOTE — Telephone Encounter (Signed)
 Attempted to contact patient no answer

## 2023-12-03 NOTE — Telephone Encounter (Signed)
 I was able to speak with the and discuss the prep for CT scan.  He verbalized understanding and will pick up prescription today and follow the instructions given over the phone and sent to My Chart

## 2023-12-04 ENCOUNTER — Ambulatory Visit (HOSPITAL_COMMUNITY)
Admission: RE | Admit: 2023-12-04 | Discharge: 2023-12-04 | Disposition: A | Discharge status: Home / Self Care | Source: Ambulatory Visit | Attending: Gastroenterology | Admitting: Gastroenterology

## 2023-12-04 DIAGNOSIS — K862 Cyst of pancreas: Secondary | ICD-10-CM | POA: Insufficient documentation

## 2023-12-04 DIAGNOSIS — Z7689 Persons encountering health services in other specified circumstances: Secondary | ICD-10-CM | POA: Diagnosis not present

## 2023-12-04 MED ORDER — SODIUM CHLORIDE (PF) 0.9 % IJ SOLN
INTRAMUSCULAR | Status: AC
  Filled 2023-12-04: qty 50

## 2023-12-04 MED ORDER — IOHEXOL 300 MG/ML  SOLN
100.0000 mL | Freq: Once | INTRAMUSCULAR | Status: AC | PRN
  Administered 2023-12-04: 100 mL via INTRAVENOUS

## 2023-12-04 NOTE — Telephone Encounter (Signed)
 Called patient and call was dropped, return call and no answer.

## 2023-12-05 ENCOUNTER — Ambulatory Visit: Payer: Self-pay | Admitting: Gastroenterology

## 2023-12-05 DIAGNOSIS — K7030 Alcoholic cirrhosis of liver without ascites: Secondary | ICD-10-CM

## 2023-12-05 DIAGNOSIS — K862 Cyst of pancreas: Secondary | ICD-10-CM

## 2023-12-08 ENCOUNTER — Encounter: Payer: Self-pay | Admitting: Physician Assistant

## 2023-12-08 ENCOUNTER — Other Ambulatory Visit (INDEPENDENT_AMBULATORY_CARE_PROVIDER_SITE_OTHER)

## 2023-12-08 ENCOUNTER — Ambulatory Visit: Admitting: Physician Assistant

## 2023-12-08 ENCOUNTER — Other Ambulatory Visit: Payer: Self-pay

## 2023-12-08 VITALS — BP 130/62 | HR 87 | Ht 64.0 in | Wt 145.0 lb

## 2023-12-08 DIAGNOSIS — K862 Cyst of pancreas: Secondary | ICD-10-CM

## 2023-12-08 DIAGNOSIS — F1090 Alcohol use, unspecified, uncomplicated: Secondary | ICD-10-CM

## 2023-12-08 DIAGNOSIS — K7030 Alcoholic cirrhosis of liver without ascites: Secondary | ICD-10-CM | POA: Diagnosis not present

## 2023-12-08 DIAGNOSIS — Z7689 Persons encountering health services in other specified circumstances: Secondary | ICD-10-CM | POA: Diagnosis not present

## 2023-12-08 LAB — CBC WITH DIFFERENTIAL/PLATELET
Basophils Relative: 0 % (ref 0.0–3.0)
Eosinophils Relative: 0 % (ref 0.0–5.0)
HCT: 29.8 % — ABNORMAL LOW (ref 39.0–52.0)
Hemoglobin: 9.5 g/dL — ABNORMAL LOW (ref 13.0–17.0)
Lymphocytes Relative: 27 % (ref 12.0–46.0)
MCHC: 32 g/dL (ref 30.0–36.0)
MCV: 75.4 fl — ABNORMAL LOW (ref 78.0–100.0)
Monocytes Relative: 13 % — ABNORMAL HIGH (ref 3.0–12.0)
Neutrophils Relative %: 60 % (ref 43.0–77.0)
Platelets: 117 10*3/uL — ABNORMAL LOW (ref 150.0–400.0)
RBC: 3.96 Mil/uL — ABNORMAL LOW (ref 4.22–5.81)
RDW: 24.7 % — ABNORMAL HIGH (ref 11.5–15.5)
WBC: 6 10*3/uL (ref 4.0–10.5)

## 2023-12-08 LAB — PROTIME-INR
INR: 2.4 ratio — ABNORMAL HIGH (ref 0.8–1.0)
Prothrombin Time: 24.5 s — ABNORMAL HIGH (ref 9.6–13.1)

## 2023-12-08 LAB — COMPREHENSIVE METABOLIC PANEL WITH GFR
ALT: 12 U/L (ref 0–53)
AST: 26 U/L (ref 0–37)
Albumin: 3.7 g/dL (ref 3.5–5.2)
Alkaline Phosphatase: 122 U/L — ABNORMAL HIGH (ref 39–117)
BUN: 10 mg/dL (ref 6–23)
CO2: 26 meq/L (ref 19–32)
Calcium: 9.2 mg/dL (ref 8.4–10.5)
Chloride: 101 meq/L (ref 96–112)
Creatinine, Ser: 0.61 mg/dL (ref 0.40–1.50)
GFR: 111.57 mL/min (ref >60.00)
Glucose, Bld: 301 mg/dL — ABNORMAL HIGH (ref 70–99)
Potassium: 4 meq/L (ref 3.5–5.1)
Sodium: 136 meq/L (ref 135–145)
Total Bilirubin: 1.5 mg/dL — ABNORMAL HIGH (ref 0.2–1.2)
Total Protein: 7.8 g/dL (ref 6.0–8.3)

## 2023-12-08 MED ORDER — LACTULOSE 10 GM/15ML PO SOLN
30.0000 g | ORAL | 3 refills | Status: DC | PRN
  Filled 2023-12-08: qty 236, 6d supply, fill #0
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
## 2023-12-08 NOTE — Patient Instructions (Addendum)
 Your provider has requested that you go to the basement level for lab work before leaving today. Press "B" on the elevator. The lab is located at the first door on the left as you exit the elevator.  We have sent the following medications to your pharmacy for you to pick up at your convenience: Lactulose  45 ml as needed to maintain 3 bowel movements a day.   _______________________________________________________  If your blood pressure at your visit was 140/90 or greater, please contact your primary care physician to follow up on this.  _______________________________________________________  If you are age 51 or older, your body mass index should be between 23-30. Your Body mass index is 24.89 kg/m. If this is out of the aforementioned range listed, please consider follow up with your Primary Care Provider.  If you are age 44 or younger, your body mass index should be between 19-25. Your Body mass index is 24.89 kg/m. If this is out of the aformentioned range listed, please consider follow up with your Primary Care Provider.   ________________________________________________________  The [facility] providers would like to encourage you to use MYCHART to communicate with providers for non-urgent requests or questions.  Due to long hold times on the telephone, sending your provider a message by [facility] may be a faster and more efficient way to get a response.  Please allow 48 business hours for a response.  Please remember that this is for non-urgent requests.  _______________________________________________________

## 2023-12-09 ENCOUNTER — Other Ambulatory Visit: Payer: Self-pay

## 2023-12-09 ENCOUNTER — Emergency Department (HOSPITAL_COMMUNITY)
Admission: EM | Admit: 2023-12-09 | Discharge: 2023-12-09 | Disposition: A | Discharge status: Home / Self Care | Attending: Emergency Medicine | Admitting: Emergency Medicine

## 2023-12-09 ENCOUNTER — Encounter (HOSPITAL_COMMUNITY): Payer: Self-pay

## 2023-12-09 DIAGNOSIS — F10120 Alcohol abuse with intoxication, uncomplicated: Secondary | ICD-10-CM | POA: Insufficient documentation

## 2023-12-09 DIAGNOSIS — Y908 Blood alcohol level of 240 mg/100 ml or more: Secondary | ICD-10-CM | POA: Diagnosis not present

## 2023-12-09 DIAGNOSIS — F10920 Alcohol use, unspecified with intoxication, uncomplicated: Secondary | ICD-10-CM

## 2023-12-09 DIAGNOSIS — R17 Unspecified jaundice: Secondary | ICD-10-CM | POA: Diagnosis not present

## 2023-12-09 LAB — CBC
HCT: 28.3 % — ABNORMAL LOW (ref 39.0–52.0)
Hemoglobin: 9 g/dL — ABNORMAL LOW (ref 13.0–17.0)
MCH: 24.1 pg — ABNORMAL LOW (ref 26.0–34.0)
MCHC: 31.8 g/dL (ref 30.0–36.0)
MCV: 75.7 fL — ABNORMAL LOW (ref 80.0–100.0)
Platelets: 149 10*3/uL — ABNORMAL LOW (ref 150–400)
RBC: 3.74 MIL/uL — ABNORMAL LOW (ref 4.22–5.81)
RDW: 23.2 % — ABNORMAL HIGH (ref 11.5–15.5)
WBC: 7.4 10*3/uL (ref 4.0–10.5)
nRBC: 0 % (ref 0.0–0.2)

## 2023-12-09 LAB — BASIC METABOLIC PANEL WITH GFR
Anion gap: 12 (ref 5–15)
BUN: 13 mg/dL (ref 6–20)
CO2: 21 mmol/L — ABNORMAL LOW (ref 22–32)
Calcium: 8.6 mg/dL — ABNORMAL LOW (ref 8.9–10.3)
Chloride: 102 mmol/L (ref 98–111)
Creatinine, Ser: 0.89 mg/dL (ref 0.61–1.24)
GFR, Estimated: >60 mL/min (ref >60)
Glucose, Bld: 127 mg/dL — ABNORMAL HIGH (ref 70–99)
Potassium: 4.3 mmol/L (ref 3.5–5.1)
Sodium: 135 mmol/L (ref 135–145)

## 2023-12-09 LAB — CANCER ANTIGEN 19-9: CA 19-9: 262 U/mL — ABNORMAL HIGH (ref <34)

## 2023-12-09 LAB — ETHANOL: Alcohol, Ethyl (B): 410 mg/dL (ref <15)

## 2023-12-09 MED ORDER — SODIUM CHLORIDE 0.9 % IV BOLUS
1000.0000 mL | Freq: Once | INTRAVENOUS | Status: AC
  Administered 2023-12-09: 1000 mL via INTRAVENOUS

## 2023-12-09 MED ORDER — CHLORDIAZEPOXIDE HCL 25 MG PO CAPS
ORAL_CAPSULE | ORAL | 0 refills | Status: AC
  Filled 2023-12-09: qty 10, 2d supply, fill #0

## 2023-12-09 MED ORDER — CHLORDIAZEPOXIDE HCL 25 MG PO CAPS
100.0000 mg | ORAL_CAPSULE | Freq: Once | ORAL | Status: AC
  Administered 2023-12-09: 100 mg via ORAL
  Filled 2023-12-09: qty 4
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
## 2023-12-09 NOTE — ED Provider Triage Note (Signed)
 Emergency Medicine Provider Triage Evaluation Note  Gary Mitchell , a 53 y.o. male  was evaluated in triage.  Pt complains of seizure.  Review of Systems  Positive: Thinks he had a seizure Negative: Headache, pain  Physical Exam  BP (!) 89/59   Pulse 84   Temp 98.1 F (36.7 C) (Oral)   Resp 18   Ht 5\' 4"  (1.626 m)   Wt 65.8 kg   SpO2 94%   BMI 24.89 kg/m  Gen:   Awake, no distress   Resp:  Normal effort  MSK:   Moves extremities without difficulty  Other:    Medical Decision Making  Medically screening exam initiated at 1:41 PM.  Appropriate orders placed.  Gary Mitchell was informed that the remainder of the evaluation will be completed by another provider, this initial triage assessment does not replace that evaluation, and the importance of remaining in the ED until their evaluation is complete.  Patient reports history of seizure, "I usually have 2 or 3". Unwitnessed. Smells of ETOH, history of same. Hypotensive in triage.    Mandy Second, PA-C 12/09/23 1342

## 2023-12-09 NOTE — ED Notes (Signed)
Pt able to ambulate to the restroom with no assistance.

## 2023-12-09 NOTE — ED Notes (Signed)
 Called patient's mother she gets off of work at 430 can pick him up at 5pm.

## 2023-12-09 NOTE — Progress Notes (Signed)
 Attending Physician's Attestation   I have reviewed the chart.   I agree with the Advanced Practitioner's note, impression, and recommendations with any updates as below. We should make sure that he gets an AFP level now for [facility] screening and at the time of his follow-up imaging he should have CBC/CMP/INR/AFP at 71-month interval for his HCC screening.   Yong Henle, MD [facility] Advanced Endoscopy Office # [phone]

## 2023-12-09 NOTE — ED Notes (Signed)
 Pt's mother updated per Pt request.
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
## 2023-12-09 NOTE — ED Notes (Signed)
 Pt awake, alert, and ambulatory.

## 2023-12-09 NOTE — Discharge Instructions (Addendum)
 Follow up with your family doc in the office.   I prescribed you some medication that may help you withdrawing from alcohol if you want to try to cut back on your intake.  I have given you information in places that could help you come completely off of alcohol.

## 2023-12-09 NOTE — ED Notes (Signed)
Given sandwich tray and drink.  

## 2023-12-10 ENCOUNTER — Ambulatory Visit: Payer: Self-pay | Admitting: Gastroenterology

## 2023-12-10 ENCOUNTER — Other Ambulatory Visit: Payer: Self-pay

## 2023-12-10 DIAGNOSIS — K862 Cyst of pancreas: Secondary | ICD-10-CM

## 2023-12-10 DIAGNOSIS — R978 Other abnormal tumor markers: Secondary | ICD-10-CM
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
## 2023-12-15 ENCOUNTER — Other Ambulatory Visit: Payer: Self-pay

## 2023-12-15 ENCOUNTER — Ambulatory Visit: Attending: Nurse Practitioner | Admitting: Pharmacist

## 2023-12-15 ENCOUNTER — Encounter: Payer: Self-pay | Admitting: Pharmacist

## 2023-12-15 DIAGNOSIS — Z794 Long term (current) use of insulin: Secondary | ICD-10-CM | POA: Diagnosis not present

## 2023-12-15 DIAGNOSIS — Z7984 Long term (current) use of oral hypoglycemic drugs: Secondary | ICD-10-CM

## 2023-12-15 DIAGNOSIS — Z7689 Persons encountering health services in other specified circumstances: Secondary | ICD-10-CM | POA: Diagnosis not present

## 2023-12-15 DIAGNOSIS — E1142 Type 2 diabetes mellitus with diabetic polyneuropathy: Secondary | ICD-10-CM | POA: Diagnosis not present

## 2023-12-15 DIAGNOSIS — E1169 Type 2 diabetes mellitus with other specified complication: Secondary | ICD-10-CM

## 2023-12-15 MED ORDER — FREESTYLE LIBRE 3 READER DEVI
0 refills | Status: DC
  Filled 2023-12-15: qty 1, 30d supply, fill #0

## 2023-12-15 MED ORDER — FREESTYLE LIBRE 3 PLUS SENSOR MISC
6 refills | Status: DC
  Filled 2023-12-15: qty 2, 30d supply, fill #0
  Filled 2023-12-31 (×2): qty 2, 30d supply, fill #1
  Filled 2024-03-04 (×2): qty 2, 30d supply, fill #2

## 2023-12-16 ENCOUNTER — Other Ambulatory Visit: Payer: Self-pay

## 2023-12-16 ENCOUNTER — Other Ambulatory Visit: Payer: Self-pay | Admitting: *Deleted

## 2023-12-16 DIAGNOSIS — K7030 Alcoholic cirrhosis of liver without ascites: Secondary | ICD-10-CM

## 2023-12-16 NOTE — Progress Notes (Signed)
 Spoke with patient informed him needed to have an additional blood test. Patient will stop by the office in the next week. Orders placed.

## 2023-12-18 ENCOUNTER — Emergency Department (HOSPITAL_COMMUNITY)

## 2023-12-18 ENCOUNTER — Emergency Department (HOSPITAL_COMMUNITY)
Admission: EM | Admit: 2023-12-18 | Discharge: 2023-12-19 | Disposition: A | Discharge status: Home / Self Care | Attending: Emergency Medicine | Admitting: Emergency Medicine

## 2023-12-18 ENCOUNTER — Encounter (HOSPITAL_COMMUNITY): Payer: Self-pay

## 2023-12-18 ENCOUNTER — Other Ambulatory Visit: Payer: Self-pay

## 2023-12-18 DIAGNOSIS — Y92009 Unspecified place in unspecified non-institutional (private) residence as the place of occurrence of the external cause: Secondary | ICD-10-CM | POA: Diagnosis not present

## 2023-12-18 DIAGNOSIS — S02601A Fracture of unspecified part of body of right mandible, initial encounter for closed fracture: Secondary | ICD-10-CM | POA: Insufficient documentation

## 2023-12-18 DIAGNOSIS — R220 Localized swelling, mass and lump, head: Secondary | ICD-10-CM | POA: Diagnosis not present

## 2023-12-18 DIAGNOSIS — R9082 White matter disease, unspecified: Secondary | ICD-10-CM | POA: Diagnosis not present

## 2023-12-18 DIAGNOSIS — S99911A Unspecified injury of right ankle, initial encounter: Secondary | ICD-10-CM | POA: Diagnosis present

## 2023-12-18 DIAGNOSIS — S82841A Displaced bimalleolar fracture of right lower leg, initial encounter for closed fracture: Secondary | ICD-10-CM | POA: Diagnosis not present

## 2023-12-18 DIAGNOSIS — Z7984 Long term (current) use of oral hypoglycemic drugs: Secondary | ICD-10-CM | POA: Insufficient documentation

## 2023-12-18 DIAGNOSIS — Z87891 Personal history of nicotine dependence: Secondary | ICD-10-CM | POA: Insufficient documentation

## 2023-12-18 DIAGNOSIS — E119 Type 2 diabetes mellitus without complications: Secondary | ICD-10-CM | POA: Diagnosis not present

## 2023-12-18 DIAGNOSIS — F10920 Alcohol use, unspecified with intoxication, uncomplicated: Secondary | ICD-10-CM | POA: Diagnosis not present

## 2023-12-18 DIAGNOSIS — S0990XA Unspecified injury of head, initial encounter: Secondary | ICD-10-CM | POA: Diagnosis not present

## 2023-12-18 DIAGNOSIS — Y906 Blood alcohol level of 120-199 mg/100 ml: Secondary | ICD-10-CM | POA: Diagnosis not present

## 2023-12-18 DIAGNOSIS — M7989 Other specified soft tissue disorders: Secondary | ICD-10-CM | POA: Diagnosis not present

## 2023-12-18 DIAGNOSIS — Z419 Encounter for procedure for purposes other than remedying health state, unspecified: Secondary | ICD-10-CM | POA: Diagnosis not present

## 2023-12-18 DIAGNOSIS — Z794 Long term (current) use of insulin: Secondary | ICD-10-CM | POA: Insufficient documentation

## 2023-12-18 DIAGNOSIS — S82421A Displaced transverse fracture of shaft of right fibula, initial encounter for closed fracture: Secondary | ICD-10-CM | POA: Diagnosis not present

## 2023-12-18 DIAGNOSIS — S299XXA Unspecified injury of thorax, initial encounter: Secondary | ICD-10-CM | POA: Diagnosis not present

## 2023-12-18 DIAGNOSIS — S199XXA Unspecified injury of neck, initial encounter: Secondary | ICD-10-CM | POA: Diagnosis not present

## 2023-12-18 DIAGNOSIS — Z8616 Personal history of COVID-19: Secondary | ICD-10-CM | POA: Insufficient documentation

## 2023-12-18 DIAGNOSIS — Z7901 Long term (current) use of anticoagulants: Secondary | ICD-10-CM | POA: Insufficient documentation

## 2023-12-18 DIAGNOSIS — S82451A Displaced comminuted fracture of shaft of right fibula, initial encounter for closed fracture: Secondary | ICD-10-CM | POA: Diagnosis not present

## 2023-12-18 DIAGNOSIS — S8251XA Displaced fracture of medial malleolus of right tibia, initial encounter for closed fracture: Secondary | ICD-10-CM | POA: Diagnosis not present

## 2023-12-18 DIAGNOSIS — J9811 Atelectasis: Secondary | ICD-10-CM | POA: Diagnosis not present

## 2023-12-18 DIAGNOSIS — S82891A Other fracture of right lower leg, initial encounter for closed fracture: Secondary | ICD-10-CM

## 2023-12-18 LAB — CBC WITH DIFFERENTIAL/PLATELET
Abs Immature Granulocytes: 0.02 10*3/uL (ref 0.00–0.07)
Basophils Absolute: 0.1 10*3/uL (ref 0.0–0.1)
Basophils Relative: 1 %
Eosinophils Absolute: 0.2 10*3/uL (ref 0.0–0.5)
Eosinophils Relative: 2 %
HCT: 29.1 % — ABNORMAL LOW (ref 39.0–52.0)
Hemoglobin: 9.1 g/dL — ABNORMAL LOW (ref 13.0–17.0)
Immature Granulocytes: 0 %
Lymphocytes Relative: 29 %
Lymphs Abs: 2.5 10*3/uL (ref 0.7–4.0)
MCH: 24.7 pg — ABNORMAL LOW (ref 26.0–34.0)
MCHC: 31.3 g/dL (ref 30.0–36.0)
MCV: 79.1 fL — ABNORMAL LOW (ref 80.0–100.0)
Monocytes Absolute: 0.9 10*3/uL (ref 0.1–1.0)
Monocytes Relative: 10 %
Neutro Abs: 5.1 10*3/uL (ref 1.7–7.7)
Neutrophils Relative %: 58 %
Platelets: 103 10*3/uL — ABNORMAL LOW (ref 150–400)
RBC: 3.68 MIL/uL — ABNORMAL LOW (ref 4.22–5.81)
RDW: 25.2 % — ABNORMAL HIGH (ref 11.5–15.5)
WBC: 8.8 10*3/uL (ref 4.0–10.5)
nRBC: 0 % (ref 0.0–0.2)

## 2023-12-18 LAB — COMPREHENSIVE METABOLIC PANEL WITH GFR
ALT: 17 U/L (ref 0–44)
AST: 42 U/L — ABNORMAL HIGH (ref 15–41)
Albumin: 3.4 g/dL — ABNORMAL LOW (ref 3.5–5.0)
Alkaline Phosphatase: 115 U/L (ref 38–126)
Anion gap: 15 (ref 5–15)
BUN: 8 mg/dL (ref 6–20)
CO2: 17 mmol/L — ABNORMAL LOW (ref 22–32)
Calcium: 9.5 mg/dL (ref 8.9–10.3)
Chloride: 106 mmol/L (ref 98–111)
Creatinine, Ser: 1.58 mg/dL — ABNORMAL HIGH (ref 0.61–1.24)
GFR, Estimated: 53 mL/min — ABNORMAL LOW (ref >60)
Glucose, Bld: 186 mg/dL — ABNORMAL HIGH (ref 70–99)
Potassium: 3.5 mmol/L (ref 3.5–5.1)
Sodium: 138 mmol/L (ref 135–145)
Total Bilirubin: 2.3 mg/dL — ABNORMAL HIGH (ref 0.0–1.2)
Total Protein: 7.8 g/dL (ref 6.5–8.1)

## 2023-12-18 LAB — ETHANOL: Alcohol, Ethyl (B): 156 mg/dL — ABNORMAL HIGH (ref <15)

## 2023-12-18 MED ORDER — HYDROMORPHONE HCL 1 MG/ML IJ SOLN
1.0000 mg | Freq: Once | INTRAMUSCULAR | Status: AC
  Administered 2023-12-18: 1 mg via INTRAVENOUS
  Filled 2023-12-18: qty 1
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
## 2023-12-18 NOTE — Progress Notes (Signed)
 Orthopedic Tech Progress Note Patient Details:  Gary Mitchell 27-May-1971 [id number]  Patient ID: [name], male   DOB: 06-25-1971, 53 y.o.   MRN: [id number] I attended trauma page. Terryann Fiddler 12/18/2023, 11:08 PM
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
## 2023-12-19 ENCOUNTER — Other Ambulatory Visit: Payer: Self-pay

## 2023-12-19 MED ORDER — OXYCODONE-ACETAMINOPHEN 5-325 MG PO TABS
1.0000 | ORAL_TABLET | Freq: Four times a day (QID) | ORAL | 0 refills | Status: DC | PRN
  Filled 2023-12-19: qty 15, 4d supply, fill #0

## 2023-12-19 MED ORDER — SENNOSIDES-DOCUSATE SODIUM 8.6-50 MG PO TABS
1.0000 | ORAL_TABLET | Freq: Every evening | ORAL | 0 refills | Status: DC | PRN
  Filled 2023-12-19: qty 20, 20d supply, fill #0

## 2023-12-19 NOTE — Progress Notes (Signed)
 Orthopedic Tech Progress Note Patient Details:  Gary Mitchell 1971/05/23 [id number]  Ortho Devices Type of Ortho Device: Post (short leg) splint, Stirrup splint Ortho Device/Splint Location: rle Ortho Device/Splint Interventions: Ordered, Application, Adjustment  I applied splint post reduction with the drs assist. Post Interventions Patient Tolerated: Well Instructions Provided: Care of device  Terryann Fiddler 12/19/2023, 12:11 AM

## 2023-12-19 NOTE — Discharge Planning (Signed)
    Durable Medical Equipment  (From admission, onward)           Start     Ordered   12/19/23 0645  For home use only DME standard manual wheelchair with seat cushion  Once       Comments: Patient suffers from ankle fracture which impairs their ability to perform daily activities like bathing, dressing, grooming, and toileting in the home.  A cane, crutch, or walker will not resolve issue with performing activities of daily living. A wheelchair will allow patient to safely perform daily activities. Patient can safely propel the wheelchair in the home or has a caregiver who can provide assistance. Length of need 6 months . Accessories: elevating leg rests (ELRs), wheel locks, extensions and anti-tippers.   12/19/23 9528

## 2023-12-19 NOTE — ED Notes (Signed)
 [facility] [employer] called.  2nd in line

## 2023-12-19 NOTE — ED Notes (Signed)
 Re-called [employer]; supposedly now next

## 2023-12-19 NOTE — Discharge Planning (Signed)
 Joanette Moynahan, BSN, RN, [place] [phone] Pt qualifies for DME (Durable Medical Equipment) wheelchair.  DME  ordered through [employer].  Marlou Sims of [employer] notified to deliver DME to pt room prior to D/C home.  Tera Pellicane J. Rachel Budds, BSN, RN, NCM  Transitions of Care  Nurse Case Manager  [facility] Emergency Departments  Operative Services  [phone]

## 2023-12-19 NOTE — Progress Notes (Signed)
 Orthopedic Tech Progress Note Patient Details:  Gary Mitchell 21-Mar-1971 [id number]  Patient ID: Gary Mitchell, male   DOB: 12-15-1970, 53 y.o.   MRN: [id number] I tried to do the crutch teaching. I got the patient up and they were putting weight on the splint and said they couldn't lift it. I put them back in the bed and told the dr. Terryann Fiddler 12/19/2023, 6:57 AM
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
## 2023-12-22 ENCOUNTER — Other Ambulatory Visit: Payer: Self-pay

## 2023-12-22 DIAGNOSIS — R531 Weakness: Secondary | ICD-10-CM | POA: Diagnosis not present

## 2023-12-22 DIAGNOSIS — H524 Presbyopia: Secondary | ICD-10-CM | POA: Diagnosis not present

## 2023-12-24 ENCOUNTER — Other Ambulatory Visit: Payer: Self-pay

## 2023-12-24 DIAGNOSIS — Z7689 Persons encountering health services in other specified circumstances: Secondary | ICD-10-CM | POA: Diagnosis not present

## 2023-12-24 DIAGNOSIS — M25571 Pain in right ankle and joints of right foot: Secondary | ICD-10-CM | POA: Diagnosis not present

## 2023-12-24 DIAGNOSIS — S82851A Displaced trimalleolar fracture of right lower leg, initial encounter for closed fracture: Secondary | ICD-10-CM | POA: Diagnosis not present

## 2023-12-26 NOTE — Patient Instructions (Addendum)
 SURGICAL WAITING ROOM VISITATION Patients having surgery or a procedure may have no more than 2 support people in the waiting area - these visitors may rotate.    Children under the age of 63 must have an adult with them who is not the patient.  If the patient needs to stay at the hospital during part of their recovery, the visitor guidelines for inpatient rooms apply. Pre-op nurse will coordinate an appropriate time for 1 support person to accompany patient in pre-op.  This support person may not rotate.    Please refer to the [facility] website for the visitor guidelines for Inpatients (after your surgery is over and you are in a regular room).       Your procedure is scheduled on: 01-06-24   Report to [facility] Main Entrance    Report to admitting at 8:15 AM   Call this number if you have problems the morning of surgery [phone]   Do not eat food :After Midnight.   After Midnight you may have the following liquids until 7:30 AM DAY OF SURGERY  Water  Non-Citrus Juices (without pulp, NO RED-Apple, White grape, White cranberry) Black Coffee (NO MILK/CREAM OR CREAMERS, sugar ok)  Clear Tea (NO MILK/CREAM OR CREAMERS, sugar ok) regular and decaf                             Plain Jell-O (NO RED)                                           Fruit ices (not with fruit pulp, NO RED)                                     Popsicles (NO RED)                                                               Sports drinks like Gatorade (NO RED)                   The day of surgery:  Drink ONE (1) Pre-Surgery Clear G2 by 7:30 AM the morning of surgery. Drink in one sitting. Do not sip.  This drink was given to you during your hospital  pre-op appointment visit. Nothing else to drink after completing the Pre-Surgery G2.          If you have questions, please contact your surgeon's office.   FOLLOW  ANY ADDITIONAL PRE OP INSTRUCTIONS YOU RECEIVED FROM YOUR SURGEON'S OFFICE!!!      Oral Hygiene is also important to reduce your risk of infection.                                    Remember - BRUSH YOUR TEETH THE MORNING OF SURGERY WITH YOUR REGULAR TOOTHPASTE   Do NOT smoke after Midnight   Take these medicines the morning of surgery with A SIP OF WATER :    Atorvastatin    Duloxetine   Keppra    Metoprolol    Pantoprazole    Oxycodone if needed  Stop all vitamins and herbal supplements 7 days before surgery  How to Manage Your Diabetes Before and After Surgery  Why is it important to control my blood sugar before and after surgery? Improving blood sugar levels before and after surgery helps healing and can limit problems. A way of improving blood sugar control is eating a healthy diet by:  Eating less sugar and carbohydrates  Increasing activity/exercise  Talking with your doctor about reaching your blood sugar goals High blood sugars (greater than 180 mg/dL) can raise your risk of infections and slow your recovery, so you will need to focus on controlling your diabetes during the weeks before surgery. Make sure that the doctor who takes care of your diabetes knows about your planned surgery including the date and location.  How do I manage my blood sugar before surgery? Check your blood sugar at least 4 times a day, starting 2 days before surgery, to make sure that the level is not too high or low. Check your blood sugar the morning of your surgery when you wake up and every 2 hours until you get to the Short Stay unit. If your blood sugar is less than 70 mg/dL, you will need to treat for low blood sugar: Do not take insulin . Treat a low blood sugar (less than 70 mg/dL) with  cup of clear juice (cranberry or apple), 4 glucose tablets, OR glucose gel. Recheck blood sugar in 15 minutes after treatment (to make sure it is greater than 70 mg/dL). If your blood sugar is not greater than 70 mg/dL on recheck, call [phone] for further instructions. Report your  blood sugar to the short stay nurse when you get to Short Stay.  If you are admitted to the hospital after surgery: Your blood sugar will be checked by the staff and you will probably be given insulin  after surgery (instead of oral diabetes medicines) to make sure you have good blood sugar levels. The goal for blood sugar control after surgery is 80-180 mg/dL.   WHAT DO I DO ABOUT MY DIABETES MEDICATION?  Do not take oral diabetes medicines (pills) the morning of surgery.  Hold Jardiance  3 days before surgery (do not take after 01-02-24)  THE NIGHT BEFORE SURGERY, take     units of       insulin .    THE MORNING OF SURGERY, take   units of         insulin .  The morning of surgery If your CBG is greater than 220 mg/dL, you may take  of your sliding scale  (correction) dose of insulin  (Humalog )  DO NOT TAKE THE FOLLOWING 7 DAYS PRIOR TO SURGERY: Ozempic, Wegovy, Rybelsus (Semaglutide), Byetta (exenatide), Bydureon (exenatide ER), Victoza, Saxenda (liraglutide), or Trulicity (dulaglutide) Mounjaro (Tirzepatide) Adlyxin (Lixisenatide), Polyethylene Glycol Loxenatide.  Reviewed and Endorsed by [facility] Patient Education Committee, August 2015  Bring CPAP mask and tubing day of surgery.                              You may not have any metal on your body including  jewelry, and body piercing             Do not wear lotions, powders, cologne, or deodorant              Men may shave face and neck.   Do not  bring valuables to the hospital. [facility] IS NOT RESPONSIBLE   FOR VALUABLES.   Contacts, dentures or bridgework may not be worn into surgery.   DO NOT BRING YOUR HOME MEDICATIONS TO THE HOSPITAL. PHARMACY WILL DISPENSE MEDICATIONS LISTED ON YOUR MEDICATION LIST TO YOU DURING YOUR ADMISSION IN THE HOSPITAL!    Patients discharged on the day of surgery will not be allowed to drive home.  Someone NEEDS to stay with you for the first 24 hours after anesthesia.   Special  Instructions: Bring a copy of your healthcare power of attorney and living will documents the day of surgery if you haven't scanned them before.              Please read over the following fact sheets you were given: IF YOU HAVE QUESTIONS ABOUT YOUR PRE-OP INSTRUCTIONS PLEASE CALL [phone] Gwen  If you received a COVID test during your pre-op visit  it is requested that you wear a mask when out in public, stay away from anyone that may not be feeling well and notify your surgeon if you develop symptoms. If you test positive for Covid or have been in contact with anyone that has tested positive in the last 10 days please notify you surgeon.  [facility] - Preparing for Surgery Before surgery, you can play an important role.  Because skin is not sterile, your skin needs to be as free of germs as possible.  You can reduce the number of germs on your skin by washing with CHG (chlorahexidine gluconate) soap before surgery.  CHG is an antiseptic cleaner which kills germs and bonds with the skin to continue killing germs even after washing. Please DO NOT use if you have an allergy to CHG or antibacterial soaps.  If your skin becomes reddened/irritated stop using the CHG and inform your nurse when you arrive at Short Stay. Do not shave (including legs and underarms) for at least 48 hours prior to the first CHG shower.  You may shave your face/neck.  Please follow these instructions carefully:  1.  Shower with CHG Soap the night before surgery and the  morning of surgery.  2.  If you choose to wash your hair, wash your hair first as usual with your normal  shampoo.  3.  After you shampoo, rinse your hair and body thoroughly to remove the shampoo.                             4.  Use CHG as you would any other liquid soap.  You can apply chg directly to the skin and wash.  Gently with a scrungie or clean washcloth.  5.  Apply the CHG Soap to your body ONLY FROM THE NECK DOWN.   Do   not use on face/ open                            Wound or open sores. Avoid contact with eyes, ears mouth and   genitals (private parts).                       Wash face,  Genitals (private parts) with your normal soap.             6.  Wash thoroughly, paying special attention to the area where your    surgery  will be performed.  7.  Thoroughly rinse your body with warm water  from the neck down.  8.  DO NOT shower/wash with your normal soap after using and rinsing off the CHG Soap.                9.  Pat yourself dry with a clean towel.            10.  Wear clean pajamas.            11.  Place clean sheets on your bed the night of your first shower and do not  sleep with pets. Day of Surgery : Do not apply any lotions/deodorants the morning of surgery.  Please wear clean clothes to the hospital/surgery center.  FAILURE TO FOLLOW THESE INSTRUCTIONS MAY RESULT IN THE CANCELLATION OF YOUR SURGERY  PATIENT SIGNATURE_________________________________  NURSE SIGNATURE__________________________________  ________________________________________________________________________

## 2023-12-26 NOTE — Progress Notes (Addendum)
 Date of COVID positive in last 90 days:  PCP - Joaquin Mulberry, MD Cardiologist - Avery Bodo MD   Chest x-ray - 12-18-23 Epic EKG - 12-18-23 Epic Stress Test -  ECHO - 09-13-22 Epic Cardiac Cath -  Pacemaker/ICD device last checked: Spinal Cord Stimulator:  Bowel Prep -   Sleep Study -  CPAP -   Fasting Blood Sugar -  Checks Blood Sugar _____ times a day  Last dose of GLP1 agonist-  N/A GLP1 instructions:  Do not take after     Jardiance   Last dose of SGLT-2 inhibitors-  N/A SGLT-2 instructions:  Do not take after  12-2723   Blood Thinner Instructions:  Last dose:   Time: Aspirin Instructions: Last Dose:  Activity level:  Can go up a flight of stairs and perform activities of daily living without stopping and without symptoms of chest pain or shortness of breath.  Able to exercise without symptoms  Unable to go up a flight of stairs without symptoms of     Anesthesia review: Afib, alcoholic cirrhosis, alcoholic hepatitis,  seizure disorder  Patient denies shortness of breath, fever, cough and chest pain at PAT appointment  Patient verbalized understanding of instructions that were given to them at the PAT appointment. Patient was also instructed that they will need to review over the PAT instructions again at home before surgery.

## 2023-12-29 NOTE — H&P (Signed)
 PREOPERATIVE H&P  Chief Complaint: RIGHT ANKLE FRACTURE  HPI: Gary Mitchell is a 53 y.o. male who presents with a diagnosis of RIGHT ANKLE FRACTURE. Symptoms are rated as moderate to severe, and have been worsening.  This is significantly impairing activities of daily living.  He has elected for surgical management.   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   Seizures (HCC)    Thrombocytopenia (HCC)    Past Surgical History:  Procedure Laterality Date   BIOPSY  08/02/2021   Procedure: BIOPSY;  Surgeon: Teressa Toribio SQUIBB, MD;  Location: [facility];  Service: Endoscopy;;   BIOPSY  05/07/2022   Procedure: BIOPSY;  Surgeon: Stacia Glendia BRAVO, MD;  Location: [facility];  Service: Gastroenterology;;   COLONOSCOPY WITH PROPOFOL  N/A 05/08/2022   Procedure: COLONOSCOPY WITH PROPOFOL ;  Surgeon: Stacia Glendia BRAVO, MD;  Location: [facility];  Service: Gastroenterology;  Laterality: N/A;   ENTEROSCOPY N/A 09/20/2020   Procedure: ENTEROSCOPY;  Surgeon: Eda Iha, MD;  Location: [facility] ENDOSCOPY;  Service: Gastroenterology;  Laterality: N/A;   ENTEROSCOPY N/A 05/11/2022   Procedure: ENTEROSCOPY;  Surgeon: Stacia Glendia BRAVO, MD;  Location: [facility];  Service: Gastroenterology;  Laterality: N/A;   ESOPHAGOGASTRODUODENOSCOPY (EGD) WITH PROPOFOL  N/A 08/02/2021   Procedure: ESOPHAGOGASTRODUODENOSCOPY (EGD) WITH PROPOFOL ;  Surgeon: Teressa Toribio SQUIBB, MD;  Location: [facility];  Service: Endoscopy;  Laterality: N/A;   ESOPHAGOGASTRODUODENOSCOPY (EGD) WITH PROPOFOL  N/A 01/31/2022   Procedure: ESOPHAGOGASTRODUODENOSCOPY (EGD) WITH PROPOFOL ;  Surgeon: Teressa Toribio SQUIBB, MD;  Location: [facility];  Service: Gastroenterology;  Laterality: N/A;   ESOPHAGOGASTRODUODENOSCOPY (EGD) WITH PROPOFOL  N/A 05/07/2022   Procedure: ESOPHAGOGASTRODUODENOSCOPY (EGD) WITH PROPOFOL ;  Surgeon: Stacia Glendia BRAVO, MD;  Location: [facility];  Service: Gastroenterology;  Laterality: N/A;   ESOPHAGOGASTRODUODENOSCOPY (EGD) WITH PROPOFOL  N/A 06/13/2022   Procedure: ESOPHAGOGASTRODUODENOSCOPY (EGD) WITH PROPOFOL ;  Surgeon: Wilhelmenia Aloha Raddle., MD;  Location: [facility];  Service: Gastroenterology;  Laterality: N/A;   EUS N/A 01/31/2022   Procedure: UPPER ENDOSCOPIC ULTRASOUND (EUS) RADIAL;  Surgeon: Teressa Toribio SQUIBB, MD;  Location: [facility];  Service: Gastroenterology;  Laterality: N/A;   EUS N/A 06/13/2022   Procedure: UPPER ENDOSCOPIC ULTRASOUND (EUS) LINEAR;  Surgeon: Wilhelmenia Aloha Raddle., MD;  Location: [facility];  Service: Gastroenterology;  Laterality: N/A;   FINE NEEDLE ASPIRATION N/A 08/02/2021   Procedure: FINE NEEDLE ASPIRATION (FNA) LINEAR;  Surgeon: Teressa Toribio SQUIBB, MD;  Location: [facility];  Service: Endoscopy;  Laterality: N/A;   FINE NEEDLE ASPIRATION N/A 01/31/2022   Procedure: FINE NEEDLE ASPIRATION (FNA) LINEAR;  Surgeon: Teressa Toribio SQUIBB, MD;  [facility]: [facility];  Service: Gastroenterology;  Laterality: N/A;   FINE NEEDLE ASPIRATION N/A 06/13/2022   Procedure: FINE NEEDLE ASPIRATION (FNA) LINEAR;  Surgeon: Wilhelmenia Aloha Raddle., MD;  Location: [facility];  Service: Gastroenterology;  Laterality: N/A;   GIVENS CAPSULE STUDY N/A 05/09/2022   Procedure: GIVENS CAPSULE STUDY;  Surgeon: Stacia Glendia BRAVO, MD;  Location: [facility];  Service: Gastroenterology;  Laterality: N/A;   HEMOSTASIS CLIP PLACEMENT  05/08/2022   Procedure: HEMOSTASIS CLIP  PLACEMENT;  Surgeon: Stacia Glendia BRAVO, MD;  Location: [facility];  Service: Gastroenterology;;   HEMOSTASIS CLIP PLACEMENT  05/11/2022   Procedure: HEMOSTASIS CLIP PLACEMENT;  Surgeon: Stacia Glendia BRAVO, MD;  Location: [facility];  Service: Gastroenterology;;   HOT HEMOSTASIS N/A 06/13/2022   Procedure: HOT HEMOSTASIS (ARGON PLASMA COAGULATION/BICAP);  Surgeon: Wilhelmenia Aloha Raddle., MD;  Location: [facility];  Service: Gastroenterology;  Laterality: N/A;   left kidney removed     POLYPECTOMY  05/08/2022   Procedure: POLYPECTOMY;  Surgeon: Stacia Glendia BRAVO, MD;  Location: [facility];  Service: Gastroenterology;;   UPPER ESOPHAGEAL ENDOSCOPIC ULTRASOUND (EUS) N/A 08/02/2021   Procedure: UPPER ESOPHAGEAL ENDOSCOPIC ULTRASOUND (EUS);  Surgeon: Teressa Toribio SQUIBB, MD;  Location: [facility];  Service: Endoscopy;  Laterality: N/A;  Radial and Linear   Social History   Socioeconomic History   Marital status: Single    Spouse name: Not on file   Number of children: Not on file   Years of education: Not on file   Highest education level: Not on file  Occupational History   Not on file  Tobacco Use   Smoking status: Former    Current packs/day: 0.00    Average packs/day: 0.1 packs/day for 25.0 years (2.5 ttl pk-yrs)    Types: Cigarettes    Start date: 62    Quit date: 2019    Years since quitting: 6.4    Passive exposure: Past   Smokeless tobacco: Never   Tobacco comments:    Former smoker 08/08/22  Vaping Use   Vaping status: Never Used  Substance and Sexual Activity   Alcohol use: Yes    Alcohol/week: 10.0 standard drinks of alcohol    Types: 10 Shots of liquor per week    Comment: reports 1/5 of liqour a day 12/18/23   Drug use: Not Currently    Types: Marijuana   Sexual activity: Not Currently  Other Topics Concern   Not on file  Social History Narrative   Not on file   Social Drivers of Health   Financial Resource Strain: High Risk (04/11/2023)   Overall  Financial Resource Strain (CARDIA)    Difficulty of Paying Living Expenses: Hard  Food Insecurity: No Food Insecurity (04/11/2023)   Hunger Vital Sign    Worried About Running Out of Food in the Last Year: Never true    Ran Out of Food in the Last Year: Never true  Transportation Needs: No Transportation Needs (04/11/2023)   PRAPARE - Administrator, Civil Service (Medical): No    Lack of Transportation (Non-Medical): No  Physical Activity: Patient Declined (05/13/2023)   Exercise Vital Sign    Days of Exercise per Week: Patient declined    Minutes of Exercise per Session: Patient declined  Stress: No Stress Concern Present (05/13/2023)   [employer] of Occupational Health - Occupational Stress Questionnaire    Feeling of Stress : Not at all  Social Connections: Socially Isolated (05/13/2023)   Social Connection and Isolation Panel    Frequency of Communication with Friends and Family: Once a week    Frequency of Social Gatherings with Friends and Family: Once a week    Attends Religious Services: Never    Database administrator or Organizations: No    Attends Banker Meetings: Never    Marital Status: Never married  Family History  Problem Relation Age of Onset   Diabetes Mellitus II Mother    Colon cancer Neg Hx    Esophageal cancer Neg Hx    Inflammatory bowel disease Neg Hx    Liver disease Neg Hx    Pancreatic cancer Neg Hx    Rectal cancer Neg Hx    Stomach cancer Neg Hx    Allergies  Allergen Reactions   Iohexol  Other (See Comments)    Unknown reaction at 80 days old Mom at bedside reported patient was given injections of iohexol  in foot and resulted in hole in foot or foot got infected   Prior to Admission medications   Medication Sig Start Date End Date Taking? Authorizing Provider  Accu-Chek Softclix Lancets lancets Use to check blood sugar once daily 04/02/23   Laurence Locus, DO  atorvastatin  (LIPITOR) 20 MG tablet Take 1 tablet (20 mg  total) by mouth daily. 04/02/23   Laurence Locus, DO  Blood Glucose Monitoring Suppl (ACCU-CHEK GUIDE) w/Device KIT Use to check blood sugar once daily. E11.69 12/20/22   Delbert Clam, MD  Continuous Glucose Receiver (FREESTYLE LIBRE 3 READER) DEVI Use to check blood sugar continuously. 12/15/23   Newlin, Enobong, MD  Continuous Glucose Sensor (FREESTYLE LIBRE 3 PLUS SENSOR) MISC Change sensor every 15 days. Use to check blood sugar continuously. 12/15/23   Newlin, Enobong, MD  DULoxetine  (CYMBALTA ) 60 MG capsule Take 1 capsule (60 mg total) by mouth daily. 05/13/23   Newlin, Enobong, MD  empagliflozin  (JARDIANCE ) 10 MG TABS tablet Take 1 tablet (10 mg total) by mouth daily before breakfast. 11/17/23   Newlin, Enobong, MD  furosemide  (LASIX ) 20 MG tablet Take 1 tablet (20 mg total) by mouth daily as needed. 04/02/23   Laurence Locus, DO  glucose blood (ACCU-CHEK GUIDE) test strip Use to check blood sugar once daily. E11.69 12/20/22   Newlin, Enobong, MD  insulin  glargine (LANTUS  SOLOSTAR) 100 UNIT/ML Solostar Pen Inject 30 Units into the skin daily. 11/06/23   Newlin, Enobong, MD  insulin  lispro (HUMALOG  KWIKPEN) 100 UNIT/ML KwikPen Inject 10 Units into the skin 3 (three) times daily. 06/09/23   Newlin, Enobong, MD  Insulin  Pen Needle (TRUEPLUS 5-BEVEL PEN NEEDLES) 32G X 4 MM MISC Use to inject insulin  once daily. 04/02/23   Laurence Locus, DO  lactulose  (CHRONULAC ) 10 GM/15ML solution Take 45 mLs (30 g total) by mouth as needed for mild constipation. 12/08/23   Beather Delon Gibson, PA  levETIRAcetam  (KEPPRA ) 500 MG tablet Take 1 tablet (500 mg total) by mouth 2 (two) times daily. 04/02/23 07/01/23  Laurence Locus, DO  metFORMIN  (GLUCOPHAGE ) 500 MG tablet Take 2 tablets (1,000 mg total) by mouth 2 (two) times daily with a meal. 07/11/23   Delbert Clam, MD  metoprolol  succinate (TOPROL -XL) 25 MG 24 hr tablet Take 0.5 tablets (12.5 mg total) by mouth daily. 05/13/23   Newlin, Enobong, MD  oxyCODONE -acetaminophen   (PERCOCET/ROXICET) 5-325 MG tablet Take 1 tablet by mouth every 6 (six) hours as needed for severe pain (pain score 7-10). 12/19/23   Long, Fonda MATSU, MD  pantoprazole  (PROTONIX ) 40 MG tablet Take 1 tablet (40 mg total) by mouth 2 (two) times daily. 05/13/23   Newlin, Enobong, MD  potassium chloride  SA (KLOR-CON  M) 20 MEQ tablet Take 2 tablets (40 mEq total) by mouth daily. 08/19/23   Newlin, Enobong, MD  predniSONE  (DELTASONE ) 50 MG tablet Take (1) 50 mg tablet of prednisone  13 hours prior to your procedure at 930 pm 12/03/23.  Take (1) 50 mg tablet of prednisone  7 hours prior to your procedure at 3:30 am 12/04/23.    Take (1) 50 mg tablet of prednisone  and (1) 50 mg tablet of Benadryl  1 hour prior to your procedure at 930 am 12/04/23. 12/03/23   Mansouraty, Aloha Raddle., MD  senna-docusate (SENOKOT-S) 8.6-50 MG tablet Take 1 tablet by mouth at bedtime as needed for mild constipation. 12/19/23   Long, Fonda MATSU, MD  sildenafil  (VIAGRA ) 50 MG tablet Take 1 tablet (50 mg total) by mouth daily as needed for erectile dysfunction. 11/17/23   Newlin, Enobong, MD  Zinc  220 (50 Zn) MG CAPS Take 1 capsule (220 mg total) by mouth daily. 05/05/23   Mansouraty, Aloha Raddle., MD     Positive ROS: All other systems have been reviewed and were otherwise negative with the exception of those mentioned in the HPI and as above.  Physical Exam: General: Alert, no acute distress Cardiovascular: No pedal edema Respiratory: No cyanosis, no use of accessory musculature GI: No organomegaly, abdomen is soft and non-tender Skin: No lesions in the area of chief complaint Neurologic: Sensation intact distally Psychiatric: Patient is competent for consent with normal mood and affect Lymphatic: No axillary or cervical lymphadenopathy  MUSCULOSKELETAL: patient in splint, can wiggle toes, advanced edema seen, NVI   Imaging: xrays show a trimalleolar fracture of the right ankle   Assessment: RIGHT ANKLE FRACTURE  Plan: Plan  for Procedure(s): OPEN REDUCTION INTERNAL FIXATION (ORIF) ANKLE FRACTURE  The risks benefits and alternatives were discussed with the patient including but not limited to the risks of nonoperative treatment, versus surgical intervention including infection, bleeding, nerve injury,  blood clots, cardiopulmonary complications, morbidity, mortality, among others, and they were willing to proceed.   Weightbearing: NWB RLE Orthopedic devices: splint Showering: POD 3 Dressing: reinforce PRN Medicines: ASA, Oxy, Tylenol , Mobic, Zofran   Discharge: TBD Follow up: 01/16/24 at [facility] CHRISTELLA Ted RIGGERS Office [phone] 12/29/2023 4:25 PM

## 2023-12-30 ENCOUNTER — Encounter (HOSPITAL_COMMUNITY)
Admission: RE | Admit: 2023-12-30 | Discharge: 2023-12-30 | Disposition: A | Discharge status: Home / Self Care | Source: Ambulatory Visit | Attending: Orthopedic Surgery | Admitting: Orthopedic Surgery

## 2023-12-30 ENCOUNTER — Other Ambulatory Visit (HOSPITAL_COMMUNITY): Payer: Self-pay

## 2023-12-30 ENCOUNTER — Other Ambulatory Visit: Payer: Self-pay

## 2023-12-30 ENCOUNTER — Encounter (HOSPITAL_COMMUNITY): Payer: Self-pay

## 2023-12-30 VITALS — BP 139/93 | HR 93 | Temp 98.5°F | Resp 16 | Ht 66.0 in | Wt 148.0 lb

## 2023-12-30 DIAGNOSIS — Z7689 Persons encountering health services in other specified circumstances: Secondary | ICD-10-CM | POA: Diagnosis not present

## 2023-12-30 DIAGNOSIS — F1010 Alcohol abuse, uncomplicated: Secondary | ICD-10-CM | POA: Insufficient documentation

## 2023-12-30 DIAGNOSIS — Z9119 Patient's noncompliance with other medical treatment and regimen due to financial hardship: Secondary | ICD-10-CM | POA: Insufficient documentation

## 2023-12-30 DIAGNOSIS — K7460 Unspecified cirrhosis of liver: Secondary | ICD-10-CM | POA: Insufficient documentation

## 2023-12-30 DIAGNOSIS — K859 Acute pancreatitis without necrosis or infection, unspecified: Secondary | ICD-10-CM | POA: Insufficient documentation

## 2023-12-30 DIAGNOSIS — E1140 Type 2 diabetes mellitus with diabetic neuropathy, unspecified: Secondary | ICD-10-CM | POA: Diagnosis not present

## 2023-12-30 DIAGNOSIS — Z01818 Encounter for other preprocedural examination: Secondary | ICD-10-CM | POA: Diagnosis present

## 2023-12-30 DIAGNOSIS — Z01812 Encounter for preprocedural laboratory examination: Secondary | ICD-10-CM | POA: Diagnosis not present

## 2023-12-30 DIAGNOSIS — I480 Paroxysmal atrial fibrillation: Secondary | ICD-10-CM | POA: Insufficient documentation

## 2023-12-30 DIAGNOSIS — Z87891 Personal history of nicotine dependence: Secondary | ICD-10-CM | POA: Insufficient documentation

## 2023-12-30 DIAGNOSIS — I8510 Secondary esophageal varices without bleeding: Secondary | ICD-10-CM | POA: Insufficient documentation

## 2023-12-30 DIAGNOSIS — R569 Unspecified convulsions: Secondary | ICD-10-CM | POA: Diagnosis not present

## 2023-12-30 DIAGNOSIS — Z905 Acquired absence of kidney: Secondary | ICD-10-CM | POA: Diagnosis not present

## 2023-12-30 DIAGNOSIS — E119 Type 2 diabetes mellitus without complications: Secondary | ICD-10-CM

## 2023-12-30 DIAGNOSIS — Z794 Long term (current) use of insulin: Secondary | ICD-10-CM | POA: Diagnosis not present

## 2023-12-30 DIAGNOSIS — F32A Depression, unspecified: Secondary | ICD-10-CM | POA: Insufficient documentation

## 2023-12-30 DIAGNOSIS — S82891A Other fracture of right lower leg, initial encounter for closed fracture: Secondary | ICD-10-CM | POA: Insufficient documentation

## 2023-12-30 DIAGNOSIS — F419 Anxiety disorder, unspecified: Secondary | ICD-10-CM | POA: Diagnosis not present

## 2023-12-30 HISTORY — DX: Depression, unspecified: F32.A

## 2023-12-30 HISTORY — DX: Anxiety disorder, unspecified: F41.9

## 2023-12-30 HISTORY — DX: Dyspnea, unspecified: R06.00

## 2023-12-30 LAB — GLUCOSE, CAPILLARY: Glucose-Capillary: 271 mg/dL — ABNORMAL HIGH (ref 70–99)

## 2023-12-30 LAB — HEMOGLOBIN A1C
Hgb A1c MFr Bld: 7.1 % — ABNORMAL HIGH (ref 4.8–5.6)
Mean Plasma Glucose: 157.07 mg/dL

## 2023-12-30 MED ORDER — HYDROCODONE-ACETAMINOPHEN 10-325 MG PO TABS
1.0000 | ORAL_TABLET | Freq: Two times a day (BID) | ORAL | 0 refills | Status: DC | PRN
  Filled 2023-12-30: qty 14, 7d supply, fill #0

## 2023-12-30 NOTE — Progress Notes (Signed)
 Patient in today for his preop and stated that he was being picked up by transportation after surgery and that he would not have a family member to be with him.  Also stated that he does not have anyone to help him at home after surgery.  Message left with Burnard at Dr. Fransisco office.     Addendum:  Patient changed to overnight stay.

## 2023-12-31 ENCOUNTER — Other Ambulatory Visit (HOSPITAL_COMMUNITY)

## 2023-12-31 ENCOUNTER — Other Ambulatory Visit (HOSPITAL_COMMUNITY): Payer: Self-pay

## 2023-12-31 ENCOUNTER — Other Ambulatory Visit: Payer: Self-pay

## 2023-12-31 LAB — SURGICAL PCR SCREEN
MRSA, PCR: POSITIVE — AB
Staphylococcus aureus: POSITIVE — AB

## 2023-12-31 NOTE — Progress Notes (Signed)
PCR results sent to Dr. Murphy to review.   

## 2024-01-01 NOTE — Anesthesia Preprocedure Evaluation (Addendum)
 Anesthesia Evaluation  Patient identified by MRN, date of birth, ID band Patient awake    Reviewed: Allergy & Precautions, NPO status , Patient's Chart, lab work & pertinent test results, Unable to perform ROS - Chart review only  Airway Mallampati: III  TM Distance: >3 FB Neck ROM: Full    Dental  (+) Poor Dentition, Dental Advisory Given   Pulmonary former smoker   Pulmonary exam normal breath sounds clear to auscultation       Cardiovascular Normal cardiovascular exam+ dysrhythmias Atrial Fibrillation  Rhythm:Regular Rate:Normal  Patient followed with cardiology for paroxysmal A-fib.  Last seen on 10/22/2022 by Dr. Dann.  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  Advised follow-up in 6 months but has been lost to follow-up.   Echo 09/13/2022:   1. Left ventricular ejection fraction, by estimation, is 55 to 60%. The left ventricle has normal function. The left ventricle has no regional wall motion abnormalities. Left ventricular diastolic parameters were normal. The average left ventricular global longitudinal strain is -17.9 %. The global longitudinal strain is normal.  2. Right ventricular systolic function is normal. The right ventricular size is normal. There is normal pulmonary artery systolic pressure.  3. Left atrial size was severely dilated.  4. Right atrial size was mildly dilated.  5. The mitral valve is normal in structure. Trivial mitral valve regurgitation. No evidence of mitral stenosis.  6. The aortic valve is tricuspid. Aortic valve regurgitation is not visualized. No aortic stenosis is present.  7. The inferior vena cava is normal in size with greater than 50% respiratory variability, suggesting right atrial pressure of 3 mmHg.    Neuro/Psych Seizures - (poorly controlled d/t poor medication compliance), Poorly Controlled,  PSYCHIATRIC DISORDERS  Anxiety Depression     Prior neurology notes suggest it's unclear wether patient has epilepsy versus seizures due to EtOH withdrawal.   Neuromuscular disease (peripheral neuropathy b/l LE) CVA    GI/Hepatic ,GERD  Controlled,,(+) Cirrhosis   Esophageal Varices    , Hepatitis - (alcoholic hepatitis)  Endo/Other  diabetes, Well Controlled, Type 2, Insulin  Dependent, Oral Hypoglycemic Agents  Last A1c 7.1  Renal/GU negative Renal ROS  negative genitourinary   Musculoskeletal negative musculoskeletal ROS (+)    Abdominal   Peds  Hematology  (+) Blood dyscrasia, anemia Hb 9.6, plt 187   Anesthesia Other Findings   Reproductive/Obstetrics negative OB ROS                             Anesthesia Physical Anesthesia Plan  ASA: 3  Anesthesia Plan: General and Regional   Post-op Pain Management: Regional block*   Induction: Intravenous  PONV Risk Score and Plan: 2 and Ondansetron , Dexamethasone, Midazolam  and Treatment may vary due to age or medical condition  Airway Management Planned: LMA  Additional Equipment: None  Intra-op Plan:   Post-operative Plan: Extubation in OR  Informed Consent: I have reviewed the patients History and Physical, chart, labs and discussed the procedure including the risks, benefits and alternatives for the proposed anesthesia with the patient or authorized representative who has indicated his/her understanding and acceptance.     Dental advisory given  Plan Discussed with: CRNA  Anesthesia Plan Comments:         Anesthesia Quick Evaluation
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
## 2024-01-05 ENCOUNTER — Encounter (HOSPITAL_COMMUNITY): Payer: Self-pay | Admitting: Internal Medicine

## 2024-01-05 ENCOUNTER — Encounter (HOSPITAL_COMMUNITY): Payer: Self-pay

## 2024-01-05 ENCOUNTER — Other Ambulatory Visit: Payer: Self-pay

## 2024-01-05 ENCOUNTER — Telehealth: Payer: Self-pay | Admitting: Internal Medicine

## 2024-01-05 ENCOUNTER — Observation Stay (HOSPITAL_COMMUNITY)
Admission: RE | Admit: 2024-01-05 | Discharge: 2024-01-08 | Disposition: A | Discharge status: Home / Self Care | Source: Ambulatory Visit | Attending: Family Medicine | Admitting: Family Medicine

## 2024-01-05 DIAGNOSIS — F1010 Alcohol abuse, uncomplicated: Secondary | ICD-10-CM | POA: Diagnosis not present

## 2024-01-05 DIAGNOSIS — E119 Type 2 diabetes mellitus without complications: Secondary | ICD-10-CM | POA: Diagnosis not present

## 2024-01-05 DIAGNOSIS — Z794 Long term (current) use of insulin: Secondary | ICD-10-CM | POA: Insufficient documentation

## 2024-01-05 DIAGNOSIS — S82891A Other fracture of right lower leg, initial encounter for closed fracture: Principal | ICD-10-CM | POA: Insufficient documentation

## 2024-01-05 DIAGNOSIS — X58XXXA Exposure to other specified factors, initial encounter: Secondary | ICD-10-CM | POA: Diagnosis not present

## 2024-01-05 DIAGNOSIS — E785 Hyperlipidemia, unspecified: Secondary | ICD-10-CM | POA: Diagnosis not present

## 2024-01-05 DIAGNOSIS — R569 Unspecified convulsions: Secondary | ICD-10-CM

## 2024-01-05 DIAGNOSIS — I480 Paroxysmal atrial fibrillation: Secondary | ICD-10-CM | POA: Insufficient documentation

## 2024-01-05 DIAGNOSIS — G40909 Epilepsy, unspecified, not intractable, without status epilepticus: Secondary | ICD-10-CM | POA: Insufficient documentation

## 2024-01-05 DIAGNOSIS — K219 Gastro-esophageal reflux disease without esophagitis: Secondary | ICD-10-CM | POA: Insufficient documentation

## 2024-01-05 DIAGNOSIS — N179 Acute kidney failure, unspecified: Principal | ICD-10-CM

## 2024-01-05 DIAGNOSIS — Z87891 Personal history of nicotine dependence: Secondary | ICD-10-CM | POA: Insufficient documentation

## 2024-01-05 DIAGNOSIS — E876 Hypokalemia: Secondary | ICD-10-CM | POA: Diagnosis not present

## 2024-01-05 DIAGNOSIS — Z79899 Other long term (current) drug therapy: Secondary | ICD-10-CM | POA: Insufficient documentation

## 2024-01-05 DIAGNOSIS — K7030 Alcoholic cirrhosis of liver without ascites: Secondary | ICD-10-CM

## 2024-01-05 LAB — COMPREHENSIVE METABOLIC PANEL WITH GFR
ALT: 22 U/L (ref 0–44)
AST: 51 U/L — ABNORMAL HIGH (ref 15–41)
Albumin: 3.5 g/dL (ref 3.5–5.0)
Alkaline Phosphatase: 187 U/L — ABNORMAL HIGH (ref 38–126)
Anion gap: 12 (ref 5–15)
BUN: 9 mg/dL (ref 6–20)
CO2: 23 mmol/L (ref 22–32)
Calcium: 9.2 mg/dL (ref 8.9–10.3)
Chloride: 102 mmol/L (ref 98–111)
Creatinine, Ser: 0.55 mg/dL — ABNORMAL LOW (ref 0.61–1.24)
GFR, Estimated: >60 mL/min (ref >60)
Glucose, Bld: 96 mg/dL (ref 70–99)
Potassium: 3 mmol/L — ABNORMAL LOW (ref 3.5–5.1)
Sodium: 137 mmol/L (ref 135–145)
Total Bilirubin: 2.5 mg/dL — ABNORMAL HIGH (ref 0.0–1.2)
Total Protein: 8.8 g/dL — ABNORMAL HIGH (ref 6.5–8.1)

## 2024-01-05 LAB — CBC
HCT: 29.5 % — ABNORMAL LOW (ref 39.0–52.0)
Hemoglobin: 9.3 g/dL — ABNORMAL LOW (ref 13.0–17.0)
MCH: 23.5 pg — ABNORMAL LOW (ref 26.0–34.0)
MCHC: 31.5 g/dL (ref 30.0–36.0)
MCV: 74.5 fL — ABNORMAL LOW (ref 80.0–100.0)
Platelets: 180 10*3/uL (ref 150–400)
RBC: 3.96 MIL/uL — ABNORMAL LOW (ref 4.22–5.81)
RDW: 23 % — ABNORMAL HIGH (ref 11.5–15.5)
WBC: 7.5 10*3/uL (ref 4.0–10.5)
nRBC: 0 % (ref 0.0–0.2)

## 2024-01-05 LAB — MAGNESIUM: Magnesium: 1.3 mg/dL — ABNORMAL LOW (ref 1.7–2.4)

## 2024-01-05 LAB — GLUCOSE, CAPILLARY
Glucose-Capillary: 94 mg/dL (ref 70–99)
Glucose-Capillary: 217 mg/dL — ABNORMAL HIGH (ref 70–99)

## 2024-01-05 LAB — PHOSPHORUS: Phosphorus: 2.6 mg/dL (ref 2.5–4.6)

## 2024-01-05 MED ORDER — METFORMIN HCL 500 MG PO TABS: 1000.0000 mg | ORAL_TABLET | Freq: Two times a day (BID) | ORAL | Status: DC

## 2024-01-05 MED ORDER — MAGNESIUM SULFATE 2 GM/50ML IV SOLN
2.0000 g | Freq: Once | INTRAVENOUS | Status: AC
  Administered 2024-01-05: 2 g via INTRAVENOUS
  Filled 2024-01-05: qty 50

## 2024-01-05 MED ORDER — LACTULOSE 10 GM/15ML PO SOLN: 30.0000 g | ORAL | Status: DC | PRN

## 2024-01-05 MED ORDER — EMPAGLIFLOZIN 10 MG PO TABS
10.0000 mg | ORAL_TABLET | Freq: Every day | ORAL | Status: DC
  Administered 2024-01-07 – 2024-01-08 (×2): 10 mg via ORAL
  Filled 2024-01-05 (×3): qty 1

## 2024-01-05 MED ORDER — MUPIROCIN 2 % EX OINT
TOPICAL_OINTMENT | Freq: Two times a day (BID) | CUTANEOUS | Status: DC
  Filled 2024-01-05: qty 22

## 2024-01-05 MED ORDER — ONDANSETRON HCL 4 MG PO TABS: 4.0000 mg | ORAL_TABLET | Freq: Four times a day (QID) | ORAL | Status: DC | PRN

## 2024-01-05 MED ORDER — INSULIN ASPART 100 UNIT/ML IJ SOLN
2.0000 [IU] | Freq: Once | INTRAMUSCULAR | Status: AC
  Administered 2024-01-05: 2 [IU] via SUBCUTANEOUS

## 2024-01-05 MED ORDER — ACETAMINOPHEN 650 MG RE SUPP: 650.0000 mg | Freq: Three times a day (TID) | RECTAL | Status: DC | PRN

## 2024-01-05 MED ORDER — ONDANSETRON HCL 4 MG/2ML IJ SOLN: 4.0000 mg | Freq: Four times a day (QID) | INTRAMUSCULAR | Status: DC | PRN

## 2024-01-05 MED ORDER — METOPROLOL SUCCINATE ER 25 MG PO TB24
12.5000 mg | ORAL_TABLET | Freq: Every day | ORAL | Status: DC
  Administered 2024-01-05 – 2024-01-08 (×3): 12.5 mg via ORAL
  Filled 2024-01-05 (×3): qty 1

## 2024-01-05 MED ORDER — ACETAMINOPHEN 325 MG PO TABS: 650.0000 mg | ORAL_TABLET | Freq: Four times a day (QID) | ORAL | Status: DC | PRN

## 2024-01-05 MED ORDER — OXYCODONE HCL 5 MG PO TABS
5.0000 mg | ORAL_TABLET | ORAL | Status: DC | PRN
  Administered 2024-01-05 – 2024-01-06 (×3): 5 mg via ORAL
  Filled 2024-01-05 (×3): qty 1

## 2024-01-05 MED ORDER — DULOXETINE HCL 30 MG PO CPEP
30.0000 mg | ORAL_CAPSULE | Freq: Every day | ORAL | Status: DC
  Administered 2024-01-05 – 2024-01-07 (×2): 30 mg via ORAL
  Filled 2024-01-05 (×2): qty 1

## 2024-01-05 MED ORDER — LEVETIRACETAM 500 MG PO TABS
500.0000 mg | ORAL_TABLET | Freq: Two times a day (BID) | ORAL | Status: DC
  Administered 2024-01-05 – 2024-01-08 (×5): 500 mg via ORAL
  Filled 2024-01-05 (×5): qty 1

## 2024-01-05 MED ORDER — POTASSIUM CHLORIDE CRYS ER 20 MEQ PO TBCR
40.0000 meq | EXTENDED_RELEASE_TABLET | ORAL | Status: AC
  Administered 2024-01-05: 40 meq via ORAL
  Filled 2024-01-05: qty 2

## 2024-01-05 MED ORDER — ACETAMINOPHEN 650 MG RE SUPP: 650.0000 mg | Freq: Four times a day (QID) | RECTAL | Status: DC | PRN

## 2024-01-05 MED ORDER — INSULIN GLARGINE-YFGN 100 UNIT/ML ~~LOC~~ SOLN
10.0000 [IU] | Freq: Every day | SUBCUTANEOUS | Status: DC
  Administered 2024-01-07 – 2024-01-08 (×2): 10 [IU] via SUBCUTANEOUS
  Filled 2024-01-05 (×3): qty 0.1

## 2024-01-05 MED ORDER — INSULIN ASPART 100 UNIT/ML IJ SOLN
0.0000 [IU] | Freq: Three times a day (TID) | INTRAMUSCULAR | Status: DC
  Administered 2024-01-06: 2 [IU] via SUBCUTANEOUS
  Administered 2024-01-07: 3 [IU] via SUBCUTANEOUS
  Administered 2024-01-07 (×2): 2 [IU] via SUBCUTANEOUS
  Administered 2024-01-08: 5 [IU] via SUBCUTANEOUS
  Administered 2024-01-08: 1 [IU] via SUBCUTANEOUS

## 2024-01-05 MED ORDER — PANTOPRAZOLE SODIUM 40 MG PO TBEC
40.0000 mg | DELAYED_RELEASE_TABLET | Freq: Two times a day (BID) | ORAL | Status: DC
  Administered 2024-01-05 – 2024-01-08 (×5): 40 mg via ORAL
  Filled 2024-01-05 (×5): qty 1

## 2024-01-05 MED ORDER — ACETAMINOPHEN 500 MG PO TABS: 500.0000 mg | ORAL_TABLET | Freq: Four times a day (QID) | ORAL | Status: DC | PRN

## 2024-01-05 NOTE — H&P (Signed)
 History and Physical    Patient: Gary Mitchell FMW:[id number] DOB: 1970/07/27 DOA: 01/05/2024 DOS: the patient was seen and examined on 01/05/2024 PCP: Delbert Clam, MD  Patient coming from: Home  Chief Complaint: Right ankle fracture.  HPI: Gary Mitchell is a 53 y.o. male with medical history significant of alcoholic hepatitis, anxiety, liver cirrhosis, coagulopathy, depression, type 2 diabetes, dyspnea, neuropathy of the feet, pancreatitis, pancreatic pseudocyst, renal disorder, seizures, thrombocytopenia who was referred by Dr. Evalene Murphy's office for medical admission with plans to operate his right ankle fracture tomorrow. He denied fever, chills, rhinorrhea, sore throat, wheezing or hemoptysis.  No chest pain, palpitations, diaphoresis, PND, orthopnea or pitting edema of the lower extremities.  No abdominal pain, nausea, emesis, diarrhea, constipation, melena or hematochezia.  No flank pain, dysuria, frequency or hematuria.  No polyuria, polydipsia, polyphagia or blurred vision.   On arrival to the floor vital signs were temperature 98.4 F, pulse 93, respiration 18, BP 146/92 mmHg O2 sat 100% on room air.   Review of Systems: As mentioned in the history of present illness. All other systems reviewed and are negative. Past Medical History:  Diagnosis Date   Alcoholic hepatitis 11/2019   Anemia    Anxiety    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   Seizures (HCC)    Thrombocytopenia (HCC)    Past  Surgical History:  Procedure Laterality Date   BIOPSY  08/02/2021   Procedure: BIOPSY;  Surgeon: Teressa Toribio SQUIBB, MD;  Location: [facility];  Service: Endoscopy;;   BIOPSY  05/07/2022   Procedure: BIOPSY;  Surgeon: Stacia Glendia BRAVO, MD;  Location: [facility];  Service: Gastroenterology;;   COLONOSCOPY WITH PROPOFOL  N/A 05/08/2022   Procedure: COLONOSCOPY WITH PROPOFOL ;  Surgeon: Stacia Glendia BRAVO, MD;  Location: [facility];  Service: Gastroenterology;  Laterality: N/A;   ENTEROSCOPY N/A 09/20/2020   Procedure: ENTEROSCOPY;  Surgeon: Eda Iha, MD;  Location: [facility];  Service: Gastroenterology;  Laterality: N/A;   ENTEROSCOPY N/A 05/11/2022   Procedure: ENTEROSCOPY;  Surgeon: Stacia Glendia BRAVO, MD;  Location: [facility];  Service: Gastroenterology;  Laterality: N/A;   ESOPHAGOGASTRODUODENOSCOPY (EGD) WITH PROPOFOL  N/A 08/02/2021   Procedure: ESOPHAGOGASTRODUODENOSCOPY (EGD) WITH PROPOFOL ;  Surgeon: Teressa Toribio SQUIBB, MD;  Location: [facility];  Service: Endoscopy;  Laterality: N/A;   ESOPHAGOGASTRODUODENOSCOPY (EGD) WITH PROPOFOL  N/A 01/31/2022   Procedure: ESOPHAGOGASTRODUODENOSCOPY (EGD) WITH PROPOFOL ;  Surgeon: Teressa Toribio SQUIBB, MD;  Location: [facility];  Service: Gastroenterology;  Laterality: N/A;   ESOPHAGOGASTRODUODENOSCOPY (EGD) WITH PROPOFOL  N/A 05/07/2022   Procedure: ESOPHAGOGASTRODUODENOSCOPY (EGD) WITH PROPOFOL ;  Surgeon: Stacia Glendia BRAVO, MD;  Location: [facility];  Service: Gastroenterology;  Laterality: N/A;   ESOPHAGOGASTRODUODENOSCOPY (EGD) WITH PROPOFOL  N/A 06/13/2022   Procedure: ESOPHAGOGASTRODUODENOSCOPY (EGD) WITH PROPOFOL ;  Surgeon: Wilhelmenia Aloha Raddle., MD;  Location: [facility];  Service: Gastroenterology;  Laterality: N/A;   EUS N/A 01/31/2022   Procedure: UPPER ENDOSCOPIC ULTRASOUND (EUS) RADIAL;  Surgeon: Teressa Toribio SQUIBB, MD;  Location:  [facility];  Service: Gastroenterology;  Laterality: N/A;   EUS N/A 06/13/2022   Procedure:  UPPER ENDOSCOPIC ULTRASOUND (EUS) LINEAR;  Surgeon: Wilhelmenia Aloha Raddle., MD;  Location: [facility];  Service: Gastroenterology;  Laterality: N/A;   FINE NEEDLE ASPIRATION N/A 08/02/2021   Procedure: FINE NEEDLE ASPIRATION (FNA) LINEAR;  Surgeon: Teressa Toribio SQUIBB, MD;  Location: [facility];  Service: Endoscopy;  Laterality: N/A;   FINE NEEDLE ASPIRATION N/A 01/31/2022   Procedure: FINE NEEDLE ASPIRATION (FNA) LINEAR;  Surgeon: Teressa Toribio SQUIBB, MD;  Location: [facility];  Service: Gastroenterology;  Laterality: N/A;   FINE NEEDLE ASPIRATION N/A 06/13/2022   Procedure: FINE NEEDLE ASPIRATION (FNA) LINEAR;  Surgeon: Wilhelmenia Aloha Raddle., MD;  Location: [facility];  Service: Gastroenterology;  Laterality: N/A;   GIVENS CAPSULE STUDY N/A 05/09/2022   Procedure: GIVENS CAPSULE STUDY;  Surgeon: Stacia Glendia BRAVO, MD;  Location: [facility];  Service: Gastroenterology;  Laterality: N/A;   HEMOSTASIS CLIP PLACEMENT  05/08/2022   Procedure: HEMOSTASIS CLIP PLACEMENT;  Surgeon: Stacia Glendia BRAVO, MD;  Location: [facility];  Service: Gastroenterology;;   HEMOSTASIS CLIP PLACEMENT  05/11/2022   Procedure: HEMOSTASIS CLIP PLACEMENT;  Surgeon: Stacia Glendia BRAVO, MD;  Location: [facility];  Service: Gastroenterology;;   HOT HEMOSTASIS N/A 06/13/2022   Procedure: HOT HEMOSTASIS (ARGON PLASMA COAGULATION/BICAP);  Surgeon: Wilhelmenia Aloha Raddle., MD;  Location: [facility];  Service: Gastroenterology;  Laterality: N/A;   left kidney removed     POLYPECTOMY  05/08/2022   Procedure: POLYPECTOMY;  Surgeon: Stacia Glendia BRAVO, MD;  Location: [facility];  Service: Gastroenterology;;   UPPER ESOPHAGEAL ENDOSCOPIC ULTRASOUND (EUS) N/A 08/02/2021   Procedure: UPPER ESOPHAGEAL ENDOSCOPIC ULTRASOUND (EUS);  Surgeon: Teressa Toribio SQUIBB, MD;  Location: [facility];  Service: Endoscopy;  Laterality: N/A;  Radial and Linear   Social History:  reports that he quit smoking about 6 years ago. His smoking use  included cigarettes. He started smoking about 31 years ago. He has a 2.5 pack-year smoking history. He has been exposed to tobacco smoke. He has never used smokeless tobacco. He reports current alcohol use. He reports that he does not currently use drugs after having used the following drugs: Marijuana.  Allergies  Allergen Reactions   Iohexol  Other (See Comments)    Unknown reaction at 17 days old Mom at bedside reported patient was given injections of iohexol  in foot and resulted in hole in foot or foot got infected    Family History  Problem Relation Age of Onset   Diabetes Mellitus II Mother    Colon cancer Neg Hx    Esophageal cancer Neg Hx    Inflammatory bowel disease Neg Hx    Liver disease Neg Hx    Pancreatic cancer Neg Hx    Rectal cancer Neg Hx    Stomach cancer Neg Hx     Prior to Admission medications   Medication Sig Start Date End Date Taking? Authorizing Provider  atorvastatin  (LIPITOR) 20 MG tablet Take 1 tablet (20 mg total) by mouth daily. 04/02/23   Laurence Locus, DO  Continuous Glucose Sensor (FREESTYLE LIBRE 3 PLUS SENSOR) MISC Change sensor every 15 days. Use to check blood sugar continuously. 12/15/23   Newlin, Enobong, MD  DULoxetine  (CYMBALTA ) 60 MG capsule Take 1 capsule (60 mg total) by mouth daily. 05/13/23   Newlin, Enobong, MD  empagliflozin  (JARDIANCE ) 10 MG TABS tablet Take 1 tablet (10 mg total) by mouth daily before breakfast. 11/17/23   Newlin, Enobong, MD  furosemide  (LASIX )  20 MG tablet Take 1 tablet (20 mg total) by mouth daily as needed. 04/02/23   Laurence Locus, DO  HYDROcodone -acetaminophen  (NORCO) 10-325 MG tablet Take 1 tablet by mouth twice a day as needed for pain 12/30/23     insulin  glargine (LANTUS  SOLOSTAR) 100 UNIT/ML Solostar Pen Inject 30 Units into the skin daily. 11/06/23   Newlin, Enobong, MD  insulin  lispro (HUMALOG  KWIKPEN) 100 UNIT/ML KwikPen Inject 10 Units into the skin 3 (three) times daily. 06/09/23   Newlin, Enobong, MD  lactulose   (CHRONULAC ) 10 GM/15ML solution Take 45 mLs (30 g total) by mouth as needed for mild constipation. 12/08/23   Beather Delon Gibson, PA  levETIRAcetam  (KEPPRA ) 500 MG tablet Take 1 tablet (500 mg total) by mouth 2 (two) times daily. 04/02/23 07/01/23  Laurence Locus, DO  metFORMIN  (GLUCOPHAGE ) 500 MG tablet Take 2 tablets (1,000 mg total) by mouth 2 (two) times daily with a meal. 07/11/23   Delbert Clam, MD  metoprolol  succinate (TOPROL -XL) 25 MG 24 hr tablet Take 0.5 tablets (12.5 mg total) by mouth daily. 05/13/23   Newlin, Enobong, MD  oxyCODONE -acetaminophen  (PERCOCET/ROXICET) 5-325 MG tablet Take 1 tablet by mouth every 6 (six) hours as needed for severe pain (pain score 7-10). 12/19/23   Long, Fonda MATSU, MD  pantoprazole  (PROTONIX ) 40 MG tablet Take 1 tablet (40 mg total) by mouth 2 (two) times daily. 05/13/23   Newlin, Enobong, MD  potassium chloride  SA (KLOR-CON  M) 20 MEQ tablet Take 2 tablets (40 mEq total) by mouth daily. 08/19/23   Newlin, Enobong, MD  senna-docusate (SENOKOT-S) 8.6-50 MG tablet Take 1 tablet by mouth at bedtime as needed for mild constipation. 12/19/23   Long, Joshua G, MD  sildenafil  (VIAGRA ) 50 MG tablet Take 1 tablet (50 mg total) by mouth daily as needed for erectile dysfunction. 11/17/23   Newlin, Enobong, MD  Zinc  220 (50 Zn) MG CAPS Take 1 capsule (220 mg total) by mouth daily. 05/05/23   Mansouraty, Aloha Raddle., MD    Physical Exam: There were no vitals filed for this visit. Physical Exam Vitals reviewed.  Constitutional:      Appearance: Normal appearance. He is ill-appearing.  HENT:     Head: Normocephalic.     Mouth/Throat:     Mouth: Mucous membranes are moist.   Eyes:     General: No scleral icterus.    Pupils: Pupils are equal, round, and reactive to light.    Cardiovascular:     Rate and Rhythm: Normal rate and regular rhythm.  Abdominal:     General: There is no distension.     Palpations: Abdomen is soft.   Musculoskeletal:     Right lower leg: No  edema.     Left lower leg: No edema.   Skin:    General: Skin is warm and dry.   Neurological:     Mental Status: He is alert.     Data Reviewed:  Results are pending, will review when available.  Assessment and Plan: Principal Problem:   Closed right ankle fracture Admit to telemetry/inpatient. Analgesics as needed. Antiemetics as needed. Consult TOC team. PT evaluation after surgery. Orthopedic surgery planning for surgery tomorrow.  Active Problems:   Hypokalemia Replacing. Follow potassium level in AM.    Hypomagnesemia Replacing.    GERD (gastroesophageal reflux disease) Continue PPI.    Alcohol abuse In remission according to the patient.    Type 2 diabetes mellitus (HCC) Carbohydrate modified diet. Continue Lantus  10 units SQ daily.  CBG monitoring with RI SS. Check hemoglobin A1c.    Paroxysmal atrial fibrillation (HCC) CHA?DS?-VASc Score of at least 2  Not on anticoagulation. Continue metoprolol  succinate 12.5 mg p.o. daily.    Seizure disorder (HCC) Continue Keppra  500 mg p.o. twice daily.    Hyperlipidemia Hold hold atorvastatin  due to abnormal LFTs.     Advance Care Planning:   Code Status: Full Code   Consults:   Family Communication:   Severity of Illness: The appropriate patient status for this patient is INPATIENT. Inpatient status is judged to be reasonable and necessary in order to provide the required intensity of service to ensure the patient's safety. The patient's presenting symptoms, physical exam findings, and initial radiographic and laboratory data in the context of their chronic comorbidities is felt to place them at high risk for further clinical deterioration. Furthermore, it is not anticipated that the patient will be medically stable for discharge from the hospital within 2 midnights of admission.   * I certify that at the point of admission it is my clinical judgment that the patient will require inpatient hospital care  spanning beyond 2 midnights from the point of admission due to high intensity of service, high risk for further deterioration and high frequency of surveillance required.*  Author: Alm Dorn Castor, MD 01/05/2024 1:43 PM  For on call review [email or web address].   This document was prepared using Dragon voice recognition software and may contain some unintended transcription errors.

## 2024-01-05 NOTE — Telephone Encounter (Signed)
 [facility] direct admission acceptance note.  The patient has been accepted to a telemetry bed at [facility] for ankle fracture repair with Dr. Beverley tomorrow.  His chart was reviewed.  He has a history of alcohol abuse and seizure disorder may need to be started on CIWA protocol.  May need to be upgraded to PCU postoperatively or if showing signs of alcohol withdrawal.  Alm Castor, MD.

## 2024-01-06 ENCOUNTER — Encounter (HOSPITAL_COMMUNITY): Payer: Self-pay

## 2024-01-06 ENCOUNTER — Encounter (HOSPITAL_COMMUNITY)
Admission: RE | Disposition: A | Discharge status: Home / Self Care | Payer: Self-pay | Source: Ambulatory Visit | Attending: Family Medicine

## 2024-01-06 ENCOUNTER — Inpatient Hospital Stay (HOSPITAL_COMMUNITY): Payer: Self-pay | Admitting: Physician Assistant

## 2024-01-06 ENCOUNTER — Other Ambulatory Visit (HOSPITAL_COMMUNITY): Payer: Self-pay

## 2024-01-06 ENCOUNTER — Other Ambulatory Visit: Payer: Self-pay

## 2024-01-06 ENCOUNTER — Inpatient Hospital Stay (HOSPITAL_COMMUNITY): Payer: Self-pay | Admitting: Medical

## 2024-01-06 DIAGNOSIS — S82891D Other fracture of right lower leg, subsequent encounter for closed fracture with routine healing: Secondary | ICD-10-CM | POA: Diagnosis not present

## 2024-01-06 DIAGNOSIS — I480 Paroxysmal atrial fibrillation: Secondary | ICD-10-CM

## 2024-01-06 DIAGNOSIS — E119 Type 2 diabetes mellitus without complications: Secondary | ICD-10-CM

## 2024-01-06 DIAGNOSIS — S82892A Other fracture of left lower leg, initial encounter for closed fracture: Secondary | ICD-10-CM | POA: Diagnosis not present

## 2024-01-06 DIAGNOSIS — Z87891 Personal history of nicotine dependence: Secondary | ICD-10-CM | POA: Diagnosis not present

## 2024-01-06 DIAGNOSIS — S82891A Other fracture of right lower leg, initial encounter for closed fracture: Secondary | ICD-10-CM

## 2024-01-06 DIAGNOSIS — G8918 Other acute postprocedural pain: Secondary | ICD-10-CM | POA: Diagnosis not present

## 2024-01-06 DIAGNOSIS — N179 Acute kidney failure, unspecified: Secondary | ICD-10-CM

## 2024-01-06 DIAGNOSIS — S93431A Sprain of tibiofibular ligament of right ankle, initial encounter: Secondary | ICD-10-CM | POA: Diagnosis not present

## 2024-01-06 DIAGNOSIS — K7030 Alcoholic cirrhosis of liver without ascites: Secondary | ICD-10-CM

## 2024-01-06 DIAGNOSIS — Z794 Long term (current) use of insulin: Secondary | ICD-10-CM | POA: Diagnosis not present

## 2024-01-06 DIAGNOSIS — S82851A Displaced trimalleolar fracture of right lower leg, initial encounter for closed fracture: Secondary | ICD-10-CM | POA: Diagnosis not present

## 2024-01-06 DIAGNOSIS — I4891 Unspecified atrial fibrillation: Secondary | ICD-10-CM | POA: Diagnosis not present

## 2024-01-06 HISTORY — PX: ORIF ANKLE FRACTURE: SHX5408

## 2024-01-06 LAB — COMPREHENSIVE METABOLIC PANEL WITH GFR
ALT: 20 U/L (ref 0–44)
AST: 44 U/L — ABNORMAL HIGH (ref 15–41)
Albumin: 3.2 g/dL — ABNORMAL LOW (ref 3.5–5.0)
Alkaline Phosphatase: 178 U/L — ABNORMAL HIGH (ref 38–126)
Anion gap: 14 (ref 5–15)
BUN: 12 mg/dL (ref 6–20)
CO2: 20 mmol/L — ABNORMAL LOW (ref 22–32)
Calcium: 9.3 mg/dL (ref 8.9–10.3)
Chloride: 102 mmol/L (ref 98–111)
Creatinine, Ser: 0.65 mg/dL (ref 0.61–1.24)
GFR, Estimated: >60 mL/min (ref >60)
Glucose, Bld: 146 mg/dL — ABNORMAL HIGH (ref 70–99)
Potassium: 3.8 mmol/L (ref 3.5–5.1)
Sodium: 136 mmol/L (ref 135–145)
Total Bilirubin: 2.2 mg/dL — ABNORMAL HIGH (ref 0.0–1.2)
Total Protein: 8.2 g/dL — ABNORMAL HIGH (ref 6.5–8.1)

## 2024-01-06 LAB — CREATININE, SERUM
Creatinine, Ser: 0.78 mg/dL (ref 0.61–1.24)
GFR, Estimated: >60 mL/min (ref >60)

## 2024-01-06 LAB — CBC
HCT: 31.5 % — ABNORMAL LOW (ref 39.0–52.0)
Hemoglobin: 9.6 g/dL — ABNORMAL LOW (ref 13.0–17.0)
MCH: 23.3 pg — ABNORMAL LOW (ref 26.0–34.0)
MCHC: 30.5 g/dL (ref 30.0–36.0)
MCV: 76.5 fL — ABNORMAL LOW (ref 80.0–100.0)
Platelets: 187 10*3/uL (ref 150–400)
RBC: 4.12 MIL/uL — ABNORMAL LOW (ref 4.22–5.81)
RDW: 23.7 % — ABNORMAL HIGH (ref 11.5–15.5)
WBC: 9.4 10*3/uL (ref 4.0–10.5)
nRBC: 0 % (ref 0.0–0.2)

## 2024-01-06 LAB — GLUCOSE, CAPILLARY
Glucose-Capillary: 151 mg/dL — ABNORMAL HIGH (ref 70–99)
Glucose-Capillary: 150 mg/dL — ABNORMAL HIGH (ref 70–99)
Glucose-Capillary: 184 mg/dL — ABNORMAL HIGH (ref 70–99)
Glucose-Capillary: 166 mg/dL — ABNORMAL HIGH (ref 70–99)
Glucose-Capillary: 348 mg/dL — ABNORMAL HIGH (ref 70–99)

## 2024-01-06 LAB — HIV ANTIBODY (ROUTINE TESTING W REFLEX): HIV Screen 4th Generation wRfx: NONREACTIVE

## 2024-01-06 SURGERY — OPEN REDUCTION INTERNAL FIXATION (ORIF) ANKLE FRACTURE
Anesthesia: Regional | Site: Ankle | Laterality: Right

## 2024-01-06 MED ORDER — METHOCARBAMOL 1000 MG/10ML IJ SOLN: 500.0000 mg | Freq: Four times a day (QID) | INTRAMUSCULAR | Status: DC | PRN

## 2024-01-06 MED ORDER — LACTATED RINGERS IV SOLN: INTRAVENOUS | Status: DC

## 2024-01-06 MED ORDER — INSULIN ASPART 100 UNIT/ML IJ SOLN
5.0000 [IU] | Freq: Once | INTRAMUSCULAR | Status: AC
  Administered 2024-01-07: 5 [IU] via SUBCUTANEOUS

## 2024-01-06 MED ORDER — PHENYLEPHRINE 80 MCG/ML (10ML) SYRINGE FOR IV PUSH (FOR BLOOD PRESSURE SUPPORT)
PREFILLED_SYRINGE | INTRAVENOUS | Status: AC
  Filled 2024-01-06: qty 10

## 2024-01-06 MED ORDER — DEXAMETHASONE SODIUM PHOSPHATE 10 MG/ML IJ SOLN
INTRAMUSCULAR | Status: DC | PRN
  Administered 2024-01-06: 10 mg via INTRAVENOUS

## 2024-01-06 MED ORDER — OXYCODONE HCL 5 MG/5ML PO SOLN: 5.0000 mg | Freq: Once | ORAL | Status: DC | PRN

## 2024-01-06 MED ORDER — ONDANSETRON HCL 4 MG PO TABS: 4.0000 mg | ORAL_TABLET | Freq: Four times a day (QID) | ORAL | Status: DC | PRN

## 2024-01-06 MED ORDER — FENTANYL CITRATE (PF) 100 MCG/2ML IJ SOLN
INTRAMUSCULAR | Status: AC
  Filled 2024-01-06: qty 2

## 2024-01-06 MED ORDER — PROPOFOL 10 MG/ML IV BOLUS
INTRAVENOUS | Status: AC
  Filled 2024-01-06: qty 20

## 2024-01-06 MED ORDER — DEXAMETHASONE SODIUM PHOSPHATE 10 MG/ML IJ SOLN
INTRAMUSCULAR | Status: AC
  Filled 2024-01-06: qty 1

## 2024-01-06 MED ORDER — ONDANSETRON HCL 4 MG/2ML IJ SOLN
INTRAMUSCULAR | Status: AC
  Filled 2024-01-06: qty 2

## 2024-01-06 MED ORDER — ORAL CARE MOUTH RINSE: 15.0000 mL | Freq: Once | OROMUCOSAL | Status: DC

## 2024-01-06 MED ORDER — METHOCARBAMOL 500 MG PO TABS: 500.0000 mg | ORAL_TABLET | Freq: Four times a day (QID) | ORAL | Status: DC | PRN

## 2024-01-06 MED ORDER — OXYCODONE HCL 5 MG PO TABS
5.0000 mg | ORAL_TABLET | Freq: Four times a day (QID) | ORAL | Status: DC | PRN
  Administered 2024-01-07: 5 mg via ORAL
  Filled 2024-01-06: qty 1

## 2024-01-06 MED ORDER — HYDROMORPHONE HCL 1 MG/ML IJ SOLN
0.5000 mg | INTRAMUSCULAR | Status: DC | PRN
  Administered 2024-01-07: 0.5 mg via INTRAVENOUS
  Filled 2024-01-06: qty 0.5

## 2024-01-06 MED ORDER — POVIDONE-IODINE 10 % EX SWAB
2.0000 | Freq: Once | CUTANEOUS | Status: AC
  Administered 2024-01-06: 2 via TOPICAL

## 2024-01-06 MED ORDER — METOPROLOL TARTRATE 5 MG/5ML IV SOLN: 5.0000 mg | INTRAVENOUS | Status: DC | PRN

## 2024-01-06 MED ORDER — DEXMEDETOMIDINE HCL IN NACL 80 MCG/20ML IV SOLN
INTRAVENOUS | Status: AC
  Filled 2024-01-06: qty 20

## 2024-01-06 MED ORDER — STERILE WATER FOR IRRIGATION IR SOLN
Status: DC | PRN
  Administered 2024-01-06: 1000 mL

## 2024-01-06 MED ORDER — EPHEDRINE SULFATE-NACL 50-0.9 MG/10ML-% IV SOSY
PREFILLED_SYRINGE | INTRAVENOUS | Status: DC | PRN
  Administered 2024-01-06: 10 mg via INTRAVENOUS

## 2024-01-06 MED ORDER — ONDANSETRON HCL 4 MG/2ML IJ SOLN: 4.0000 mg | Freq: Once | INTRAMUSCULAR | Status: DC | PRN

## 2024-01-06 MED ORDER — PHENYLEPHRINE HCL-NACL 20-0.9 MG/250ML-% IV SOLN
INTRAVENOUS | Status: DC | PRN
  Administered 2024-01-06: 50 ug/min via INTRAVENOUS

## 2024-01-06 MED ORDER — BUPIVACAINE LIPOSOME 1.3 % IJ SUSP
INTRAMUSCULAR | Status: DC | PRN
  Administered 2024-01-06: 20 mL via PERINEURAL

## 2024-01-06 MED ORDER — MUPIROCIN 2 % EX OINT
1.0000 | TOPICAL_OINTMENT | Freq: Two times a day (BID) | CUTANEOUS | 0 refills | Status: AC
  Filled 2024-01-06: qty 66, 30d supply, fill #0

## 2024-01-06 MED ORDER — METOCLOPRAMIDE HCL 10 MG PO TABS: 5.0000 mg | ORAL_TABLET | Freq: Three times a day (TID) | ORAL | Status: DC | PRN

## 2024-01-06 MED ORDER — VANCOMYCIN HCL IN DEXTROSE 1-5 GM/200ML-% IV SOLN
1000.0000 mg | Freq: Once | INTRAVENOUS | Status: AC
  Administered 2024-01-06: 1000 mg via INTRAVENOUS
  Filled 2024-01-06: qty 200

## 2024-01-06 MED ORDER — DEXMEDETOMIDINE HCL IN NACL 80 MCG/20ML IV SOLN
INTRAVENOUS | Status: DC | PRN
  Administered 2024-01-06 (×2): 8 ug via INTRAVENOUS

## 2024-01-06 MED ORDER — ACETAMINOPHEN 500 MG PO TABS
1000.0000 mg | ORAL_TABLET | Freq: Four times a day (QID) | ORAL | Status: AC
  Administered 2024-01-06 – 2024-01-07 (×4): 1000 mg via ORAL
  Filled 2024-01-06 (×4): qty 2

## 2024-01-06 MED ORDER — IPRATROPIUM-ALBUTEROL 0.5-2.5 (3) MG/3ML IN SOLN: 3.0000 mL | RESPIRATORY_TRACT | Status: DC | PRN

## 2024-01-06 MED ORDER — HYDRALAZINE HCL 20 MG/ML IJ SOLN: 10.0000 mg | INTRAMUSCULAR | Status: DC | PRN

## 2024-01-06 MED ORDER — CHLORHEXIDINE GLUCONATE CLOTH 2 % EX PADS
6.0000 | MEDICATED_PAD | Freq: Every day | CUTANEOUS | Status: DC
  Administered 2024-01-07: 6 via TOPICAL

## 2024-01-06 MED ORDER — DOCUSATE SODIUM 100 MG PO CAPS
100.0000 mg | ORAL_CAPSULE | Freq: Two times a day (BID) | ORAL | Status: DC
  Administered 2024-01-06 – 2024-01-08 (×4): 100 mg via ORAL
  Filled 2024-01-06 (×4): qty 1

## 2024-01-06 MED ORDER — DEXAMETHASONE SODIUM PHOSPHATE 10 MG/ML IJ SOLN: 8.0000 mg | Freq: Once | INTRAMUSCULAR | Status: DC

## 2024-01-06 MED ORDER — ONDANSETRON HCL 4 MG/2ML IJ SOLN: 4.0000 mg | Freq: Four times a day (QID) | INTRAMUSCULAR | Status: DC | PRN

## 2024-01-06 MED ORDER — PHENYLEPHRINE HCL (PRESSORS) 10 MG/ML IV SOLN: INTRAVENOUS | Status: DC | PRN

## 2024-01-06 MED ORDER — CEFAZOLIN SODIUM-DEXTROSE 2-4 GM/100ML-% IV SOLN
2.0000 g | INTRAVENOUS | Status: AC
Start: 2024-01-06 — End: 2024-01-06
  Administered 2024-01-06: 2 g via INTRAVENOUS
  Filled 2024-01-06: qty 100

## 2024-01-06 MED ORDER — OXYCODONE HCL 5 MG PO TABS: 5.0000 mg | ORAL_TABLET | Freq: Once | ORAL | Status: DC | PRN

## 2024-01-06 MED ORDER — EPHEDRINE 5 MG/ML INJ
INTRAVENOUS | Status: AC
  Filled 2024-01-06: qty 5

## 2024-01-06 MED ORDER — CHLORHEXIDINE GLUCONATE 0.12 % MT SOLN: 15.0000 mL | Freq: Once | OROMUCOSAL | Status: DC

## 2024-01-06 MED ORDER — ROPIVACAINE HCL 5 MG/ML IJ SOLN
INTRAMUSCULAR | Status: DC | PRN
  Administered 2024-01-06: 40 mL via PERINEURAL

## 2024-01-06 MED ORDER — HYDROMORPHONE HCL 1 MG/ML IJ SOLN: 0.2500 mg | INTRAMUSCULAR | Status: DC | PRN

## 2024-01-06 MED ORDER — SENNOSIDES-DOCUSATE SODIUM 8.6-50 MG PO TABS: 1.0000 | ORAL_TABLET | Freq: Every evening | ORAL | Status: DC | PRN

## 2024-01-06 MED ORDER — PHENYLEPHRINE 80 MCG/ML (10ML) SYRINGE FOR IV PUSH (FOR BLOOD PRESSURE SUPPORT)
PREFILLED_SYRINGE | INTRAVENOUS | Status: DC | PRN
  Administered 2024-01-06 (×2): 160 ug via INTRAVENOUS

## 2024-01-06 MED ORDER — DIPHENHYDRAMINE HCL 12.5 MG/5ML PO ELIX: 12.5000 mg | ORAL_SOLUTION | ORAL | Status: DC | PRN

## 2024-01-06 MED ORDER — VANCOMYCIN HCL 1000 MG IV SOLR
INTRAVENOUS | Status: DC | PRN
  Administered 2024-01-06: 1000 mg via TOPICAL

## 2024-01-06 MED ORDER — AMISULPRIDE (ANTIEMETIC) 5 MG/2ML IV SOLN: 10.0000 mg | Freq: Once | INTRAVENOUS | Status: DC | PRN

## 2024-01-06 MED ORDER — MUPIROCIN 2 % EX OINT
1.0000 | TOPICAL_OINTMENT | Freq: Two times a day (BID) | CUTANEOUS | Status: DC
  Administered 2024-01-07 – 2024-01-08 (×3): 1 via NASAL

## 2024-01-06 MED ORDER — LIDOCAINE HCL (PF) 2 % IJ SOLN
INTRAMUSCULAR | Status: AC
  Filled 2024-01-06: qty 5

## 2024-01-06 MED ORDER — MIDAZOLAM HCL 2 MG/2ML IJ SOLN
INTRAMUSCULAR | Status: AC
  Filled 2024-01-06: qty 2

## 2024-01-06 MED ORDER — CHLORHEXIDINE GLUCONATE 4 % EX SOLN
1.0000 | CUTANEOUS | 1 refills | Status: DC
Start: 2024-01-06 — End: 2024-02-05
  Filled 2024-01-06: qty 946, 30d supply, fill #0

## 2024-01-06 MED ORDER — ENOXAPARIN SODIUM 40 MG/0.4ML IJ SOSY
40.0000 mg | PREFILLED_SYRINGE | INTRAMUSCULAR | Status: DC
  Administered 2024-01-07 – 2024-01-08 (×2): 40 mg via SUBCUTANEOUS
  Filled 2024-01-06 (×2): qty 0.4

## 2024-01-06 MED ORDER — ACETAMINOPHEN 500 MG PO TABS
1000.0000 mg | ORAL_TABLET | Freq: Once | ORAL | Status: AC
  Administered 2024-01-06: 1000 mg via ORAL
  Filled 2024-01-06: qty 2

## 2024-01-06 MED ORDER — FENTANYL CITRATE PF 50 MCG/ML IJ SOSY
50.0000 ug | PREFILLED_SYRINGE | INTRAMUSCULAR | Status: DC
  Administered 2024-01-06: 100 ug via INTRAVENOUS
  Filled 2024-01-06: qty 2

## 2024-01-06 MED ORDER — 0.9 % SODIUM CHLORIDE (POUR BTL) OPTIME
TOPICAL | Status: DC | PRN
  Administered 2024-01-06: 1000 mL

## 2024-01-06 MED ORDER — METOCLOPRAMIDE HCL 5 MG/ML IJ SOLN: 5.0000 mg | Freq: Three times a day (TID) | INTRAMUSCULAR | Status: DC | PRN

## 2024-01-06 MED ORDER — CEFAZOLIN SODIUM-DEXTROSE 2-4 GM/100ML-% IV SOLN
2.0000 g | Freq: Four times a day (QID) | INTRAVENOUS | Status: AC
  Administered 2024-01-06 – 2024-01-07 (×2): 2 g via INTRAVENOUS
  Filled 2024-01-06 (×2): qty 100

## 2024-01-06 MED ORDER — VANCOMYCIN HCL 1000 MG IV SOLR
INTRAVENOUS | Status: AC
  Filled 2024-01-06: qty 20

## 2024-01-06 MED ORDER — OXYCODONE HCL 5 MG PO TABS: 10.0000 mg | ORAL_TABLET | Freq: Four times a day (QID) | ORAL | Status: DC | PRN

## 2024-01-06 MED ORDER — LIDOCAINE HCL (CARDIAC) PF 100 MG/5ML IV SOSY
PREFILLED_SYRINGE | INTRAVENOUS | Status: DC | PRN
  Administered 2024-01-06: 40 mg via INTRAVENOUS

## 2024-01-06 MED ORDER — MIDAZOLAM HCL 2 MG/2ML IJ SOLN
1.0000 mg | INTRAMUSCULAR | Status: DC
  Administered 2024-01-06: 2 mg via INTRAVENOUS
  Filled 2024-01-06: qty 2

## 2024-01-06 MED ORDER — PROPOFOL 10 MG/ML IV BOLUS
INTRAVENOUS | Status: DC | PRN
  Administered 2024-01-06: 150 mg via INTRAVENOUS

## 2024-01-06 MED ORDER — ONDANSETRON HCL 4 MG/2ML IJ SOLN
INTRAMUSCULAR | Status: DC | PRN
  Administered 2024-01-06: 4 mg via INTRAVENOUS

## 2024-01-06 SURGICAL SUPPLY — 57 items
BAG COUNTER SPONGE SURGICOUNT (BAG) ×2 IMPLANT
BAG ZIPLOCK 12X15 (MISCELLANEOUS) ×2 IMPLANT
BANDAGE ESMARK 6X9 LF (GAUZE/BANDAGES/DRESSINGS) ×2 IMPLANT
BIT DRILL 2.6X VARIAX 2 (BIT) IMPLANT
BIT DRILL SRG 2.7XCANN AO CPLG (BIT) IMPLANT
BLADE SURG 15 STRL LF DISP TIS (BLADE) ×2 IMPLANT
BNDG COHESIVE 6X5 TAN ST LF (GAUZE/BANDAGES/DRESSINGS) ×2 IMPLANT
BNDG ELASTIC 4INX 5YD STR LF (GAUZE/BANDAGES/DRESSINGS) ×2 IMPLANT
BNDG ELASTIC 6INX 5YD STR LF (GAUZE/BANDAGES/DRESSINGS) ×2 IMPLANT
CHLORAPREP W/TINT 26 (MISCELLANEOUS) ×2 IMPLANT
CLSR STERI-STRIP ANTIMIC 1/2X4 (GAUZE/BANDAGES/DRESSINGS) IMPLANT
COVER MAYO STAND STRL (DRAPES) ×2 IMPLANT
COVER SURGICAL LIGHT HANDLE (MISCELLANEOUS) ×2 IMPLANT
CUFF TRNQT CYL 34X4.125X (TOURNIQUET CUFF) ×2 IMPLANT
DRAPE C-ARM 42X120 X-RAY (DRAPES) IMPLANT
DRAPE U-SHAPE 47X51 STRL (DRAPES) ×2 IMPLANT
DRSG EMULSION OIL 3X3 NADH (GAUZE/BANDAGES/DRESSINGS) IMPLANT
ELECT REM PT RETURN 15FT ADLT (MISCELLANEOUS) ×2 IMPLANT
GAUZE PAD ABD 8X10 STRL (GAUZE/BANDAGES/DRESSINGS) ×4 IMPLANT
GAUZE SPONGE 4X4 12PLY STRL (GAUZE/BANDAGES/DRESSINGS) ×2 IMPLANT
GLOVE BIO SURGEON STRL SZ7.5 (GLOVE) ×2 IMPLANT
GLOVE BIOGEL PI IND STRL 7.5 (GLOVE) ×2 IMPLANT
GLOVE BIOGEL PI IND STRL 8 (GLOVE) ×2 IMPLANT
GLOVE SURG SYN 7.0 (GLOVE) ×1 IMPLANT
GLOVE SURG SYN 7.0 PF PI (GLOVE) ×2 IMPLANT
GOWN STRL REUS W/ TWL LRG LVL3 (GOWN DISPOSABLE) ×2 IMPLANT
GOWN STRL REUS W/ TWL XL LVL3 (GOWN DISPOSABLE) ×2 IMPLANT
KIT BASIN OR (CUSTOM PROCEDURE TRAY) ×2 IMPLANT
KIT TURNOVER KIT A (KITS) ×2 IMPLANT
KWIRE FX150X1.25XNS LF SS (WIRE) IMPLANT
MANIFOLD NEPTUNE II (INSTRUMENTS) ×2 IMPLANT
NDL HYPO 22X1.5 SAFETY MO (MISCELLANEOUS) ×2 IMPLANT
NEEDLE HYPO 22X1.5 SAFETY MO (MISCELLANEOUS) ×1 IMPLANT
NS IRRIG 1000ML POUR BTL (IV SOLUTION) ×2 IMPLANT
PACK ORTHO EXTREMITY (CUSTOM PROCEDURE TRAY) ×2 IMPLANT
PAD CAST 4YDX4 CTTN HI CHSV (CAST SUPPLIES) ×2 IMPLANT
PADDING CAST COTTON 6X4 STRL (CAST SUPPLIES) ×2 IMPLANT
PADDING CAST SYNTHETIC 4X4 STR (CAST SUPPLIES) IMPLANT
PLATE FIBULA 5H (Plate) IMPLANT
PROTECTOR NERVE ULNAR (MISCELLANEOUS) ×2 IMPLANT
SCREW 50X4.0MM (Screw) IMPLANT
SCREW BN T10 FT 14X3.5XSTRDR (Screw) IMPLANT
SCREW BONE THRD T10 3.5X12 (Screw) IMPLANT
SPIKE FLUID TRANSFER (MISCELLANEOUS) IMPLANT
SPLINT PLASTER CAST XFAST 5X30 (CAST SUPPLIES) ×40 IMPLANT
STRAP ANKLE DISTRACTOR (MISCELLANEOUS) ×2 IMPLANT
SUCTION TUBE FRAZIER 10FR DISP (SUCTIONS) ×2 IMPLANT
SUT ETHILON 3 0 PS 1 (SUTURE) IMPLANT
SUT MON AB 2-0 CT1 36 (SUTURE) IMPLANT
SUT MON AB 3-0 SH27 (SUTURE) IMPLANT
SUT VIC AB 0 CT1 36 (SUTURE) ×4 IMPLANT
SUT VIC AB 2-0 SH 27XBRD (SUTURE) ×2 IMPLANT
SYNDESMOSIS TIGHTROPE XP (Orthopedic Implant) IMPLANT
SYR CONTROL 10ML LL (SYRINGE) ×2 IMPLANT
TOWEL OR 17X26 10 PK STRL BLUE (TOWEL DISPOSABLE) ×2 IMPLANT
TUBING CONNECTING 10 (TUBING) ×2 IMPLANT
UNDERPAD 30X36 HEAVY ABSORB (UNDERPADS AND DIAPERS) ×2 IMPLANT

## 2024-01-06 NOTE — Progress Notes (Signed)
 PROGRESS NOTE    Gary Mitchell  FMW:[id number] DOB: 11/21/1970 DOA: 01/05/2024 PCP: Delbert Clam, MD    Brief Narrative:   53 y.o. male with medical history significant of alcoholic hepatitis, anxiety, liver cirrhosis, coagulopathy, depression, type 2 diabetes, dyspnea, neuropathy of the feet, pancreatitis, pancreatic pseudocyst, renal disorder, seizures, thrombocytopenia who was referred by Dr. Evalene Murphy's office for medical admission with plans to operate his right ankle fracture.    Assessment & Plan:  Principal Problem:   Closed right ankle fracture Active Problems:   GERD (gastroesophageal reflux disease)   Alcohol abuse   Type 2 diabetes mellitus (HCC)   Paroxysmal atrial fibrillation (HCC)   Seizure disorder (HCC)   Hypomagnesemia   Hypokalemia   Hyperlipidemia    Closed right ankle fracture Plan is for ORIF per orthopedic today.  Postop management per their service including pain control, wound care management, therapy recommendations and DVT prophylaxis     Hypokalemia/hypomagnesemia Replete as needed     GERD (gastroesophageal reflux disease) Continue PPI.     Alcohol abuse In remission according to the patient.     Type 2 diabetes mellitus (HCC) Semglee  10 units, Jardiance , sliding scale and Accu-Chek.     Paroxysmal atrial fibrillation (HCC) Not on any chronic anticoagulation Toprol -XL     Seizure disorder (HCC) Continue Keppra  500 mg p.o. twice daily.     Hyperlipidemia Hold hold atorvastatin  due to abnormal LFTs.  DVT prophylaxis: SCDs Start: 01/05/24 1347    Code Status: Full Code Family Communication:   Status is: Inpatient Remains inpatient appropriate because: OR today    Subjective: Doing ok. S/p ORIF   Examination:  General exam: Appears calm and comfortable  Respiratory system: Clear to auscultation. Respiratory effort normal. Cardiovascular system: S1 & S2 heard, RRR. No JVD, murmurs, rubs, gallops or clicks. No pedal  edema. Gastrointestinal system: Abdomen is nondistended, soft and nontender. No organomegaly or masses felt. Normal bowel sounds heard. Central nervous system: Alert and oriented. No focal neurological deficits. Extremities: Symmetric 5 x 5 power. RLE dressing in place.  Skin: No rashes, lesions or ulcers Psychiatry: Judgement and insight appear normal. Mood & affect appropriate.                Diet Orders (From admission, onward)     Start     Ordered   01/06/24 1459  Diet heart healthy/carb modified Room service appropriate? Yes; Fluid consistency: Thin  Diet effective now       Question Answer Comment  Diet-HS Snack? Nothing   Room service appropriate? Yes   Fluid consistency: Thin      01/06/24 1459            Objective: Vitals:   01/06/24 1230 01/06/24 1245 01/06/24 1300 01/06/24 1305  BP: 97/68 99/60 107/64 106/67  Pulse: 84 85 74 75  Resp: 15 19 12 16   Temp:   98.5 F (36.9 C) 98.3 F (36.8 C)  TempSrc:    Oral  SpO2: 92% 95% 95% 98%  Weight:      Height:        Intake/Output Summary (Last 24 hours) at 01/06/2024 1713 Last data filed at 01/06/2024 1221 Gross per 24 hour  Intake 890 ml  Output 2050 ml  Net -1160 ml   Filed Weights   01/05/24 1422 01/06/24 0926  Weight: 65.2 kg 65.2 kg    Scheduled Meds:  acetaminophen   1,000 mg Oral Q6H   docusate sodium   100 mg Oral BID  DULoxetine   30 mg Oral Daily   empagliflozin   10 mg Oral QAC breakfast   [START ON 01/07/2024] enoxaparin  (LOVENOX ) injection  40 mg Subcutaneous Q24H   insulin  aspart  0-9 Units Subcutaneous TID WC   insulin  glargine-yfgn  10 Units Subcutaneous Daily   levETIRAcetam   500 mg Oral BID   metoprolol  succinate  12.5 mg Oral Daily   mupirocin ointment   Nasal BID   pantoprazole   40 mg Oral BID   Continuous Infusions:   ceFAZolin (ANCEF) IV      Nutritional status     Body mass index is 23.2 kg/m.  Data Reviewed:   CBC: Recent Labs  Lab 01/05/24 1404  01/06/24 0519  WBC 7.5 9.4  HGB 9.3* 9.6*  HCT 29.5* 31.5*  MCV 74.5* 76.5*  PLT 180 187   Basic Metabolic Panel: Recent Labs  Lab 01/05/24 1404 01/06/24 0519 01/06/24 1339  NA 137 136  --   K 3.0* 3.8  --   CL 102 102  --   CO2 23 20*  --   GLUCOSE 96 146*  --   BUN 9 12  --   CREATININE 0.55* 0.65 0.78  CALCIUM  9.2 9.3  --   MG 1.3*  --   --   PHOS 2.6  --   --    GFR: Estimated Creatinine Clearance: 98.6 mL/min (by C-G formula based on SCr of 0.78 mg/dL). Liver Function Tests: Recent Labs  Lab 01/05/24 1404 01/06/24 0519  AST 51* 44*  ALT 22 20  ALKPHOS 187* 178*  BILITOT 2.5* 2.2*  PROT 8.8* 8.2*  ALBUMIN 3.5 3.2*   No results for input(s): LIPASE, AMYLASE in the last 168 hours. No results for input(s): AMMONIA in the last 168 hours. Coagulation Profile: No results for input(s): INR, PROTIME in the last 168 hours. Cardiac Enzymes: No results for input(s): CKTOTAL, CKMB, CKMBINDEX, TROPONINI in the last 168 hours. BNP (last 3 results) No results for input(s): PROBNP in the last 8760 hours. HbA1C: No results for input(s): HGBA1C in the last 72 hours. CBG: Recent Labs  Lab 01/05/24 2018 01/06/24 0751 01/06/24 1220 01/06/24 1307 01/06/24 1655  GLUCAP 217* 151* 150* 184* 166*   Lipid Profile: No results for input(s): CHOL, HDL, LDLCALC, TRIG, CHOLHDL, LDLDIRECT in the last 72 hours. Thyroid  Function Tests: No results for input(s): TSH, T4TOTAL, FREET4, T3FREE, THYROIDAB in the last 72 hours. Anemia Panel: No results for input(s): VITAMINB12, FOLATE, FERRITIN, TIBC, IRON , RETICCTPCT in the last 72 hours. Sepsis Labs: No results for input(s): PROCALCITON, LATICACIDVEN in the last 168 hours.  Recent Results (from the past 240 hours)  Surgical pcr screen     Status: Abnormal   Collection Time: 12/30/23  3:06 PM   Specimen: Nasal Mucosa; Nasal Swab  Result Value Ref Range Status   MRSA, PCR  POSITIVE (A) NEGATIVE Final    Comment: RESULT CALLED TO, READ BACK BY AND VERIFIED WITH: T. GORDY [id number] 12/31/23 BY JE    Staphylococcus aureus POSITIVE (A) NEGATIVE Final    Comment: RESULT CALLED TO, READ BACK BY AND VERIFIED WITH: T. GORDY [phone] 12/31/23 BY JE (NOTE) The Xpert SA Assay (FDA approved for NASAL specimens in patients 2 years of age and older), is one component of a comprehensive surveillance program. It is not intended to diagnose infection nor to guide or monitor treatment. Performed at [facility], [address] [place], [place] [address]  Radiology Studies: No results found.         LOS: 1 day   Time spent= 35 mins    Burgess JAYSON Dare, MD [facility]  If 7PM-7AM, please contact night-coverage  01/06/2024, 5:13 PM

## 2024-01-06 NOTE — Anesthesia Procedure Notes (Signed)
 Anesthesia Regional Block: Popliteal block   Pre-Anesthetic Checklist: , timeout performed,  Correct Patient, Correct Site, Correct Laterality,  Correct Procedure, Correct Position, site marked,  Risks and benefits discussed,  Surgical consent,  Pre-op evaluation,  At surgeon's request and post-op pain management  Laterality: Right  Prep: Maximum Sterile Barrier Precautions used, chloraprep       Needles:  Injection technique: Single-shot  Needle Type: Echogenic Stimulator Needle     Needle Length: 9cm  Needle Gauge: 22     Additional Needles:   Procedures:,,,, ultrasound used (permanent image in chart),,    Narrative:  Start time: 01/06/2024 9:40 AM End time: 01/06/2024 9:43 AM Injection made incrementally with aspirations every 5 mL.  Performed by: Personally  Anesthesiologist: Merla Almarie HERO, DO  Additional Notes: Monitors applied. No increased pain on injection. No increased resistance to injection. Injection made in 5cc increments. Good needle visualization. Patient tolerated procedure well.

## 2024-01-06 NOTE — TOC Initial Note (Signed)
 Transition of Care [facility]) - Initial/Assessment Note    Patient Details  Name: Gary Mitchell MRN: [id number] Date of Birth: 06-23-1971  Transition of Care [facility]) CM/SW Contact:    Heather DELENA Saltness, LCSW Phone Number: 01/06/2024, 9:36 AM  Clinical Narrative:                 Pt presents to hospital for closed right ankle fracture. TOC consulted for HH/DME needs. Pt currently in surgery. PT/OT to evaluate after surgery. TOC will continue to follow for discharge needs.    Expected Discharge Plan: Home/Self Care Barriers to Discharge: Continued Medical Work up   Patient Goals and CMS Choice Patient states their goals for this hospitalization and ongoing recovery are:: To return home        Expected Discharge Plan and Services In-house Referral: Clinical Social Work Discharge Planning Services: NA Post Acute Care Choice: NA Living arrangements for the past 2 months: Single Family Home                 DME Arranged: N/A DME Agency: NA       HH Arranged: NA HH Agency: NA        Prior Living Arrangements/Services Living arrangements for the past 2 months: Single Family Home Lives with:: Self Patient language and need for interpreter reviewed:: Yes Do you feel safe going back to the place where you live?: Yes      Need for Family Participation in Patient Care: No (Comment) Care giver support system in place?: Yes (comment)   Criminal Activity/Legal Involvement Pertinent to Current Situation/Hospitalization: No - Comment as needed  Activities of Daily Living   ADL Screening (condition at time of admission) Independently performs ADLs?: Yes (appropriate for developmental age) Is the patient deaf or have difficulty hearing?: No Does the patient have difficulty seeing, even when wearing glasses/contacts?: No Does the patient have difficulty concentrating, remembering, or making decisions?: No  Emotional Assessment Appearance::  (UTA) Attitude/Demeanor/Rapport: Unable to  Assess Affect (typically observed): Unable to Assess Orientation: : Oriented to Self, Oriented to Place, Oriented to  Time, Oriented to Situation Alcohol / Substance Use: Not Applicable Psych Involvement: No (comment)  Admission diagnosis:  Closed right ankle fracture [S82.891A] Patient Active Problem List   Diagnosis Date Noted   Closed right ankle fracture 01/05/2024   Depression 11/17/2023   Pancreatic cyst 04/30/2023   Microcytic anemia 04/30/2023   Alcohol use disorder 04/30/2023   Acute metabolic encephalopathy 03/31/2023   Encounter for medication review and counseling 11/13/2022   Paroxysmal atrial fibrillation (HCC) 08/08/2022   Hypercoagulable state due to paroxysmal atrial fibrillation (HCC) 08/08/2022   Malnutrition of moderate degree (HCC) 05/08/2022   Benign neoplasm of descending colon    Symptomatic anemia 05/05/2022   Hyperlipidemia 05/05/2022   Hyperammonemia (HCC) 05/03/2022   Type 2 diabetes mellitus (HCC) 05/29/2021   Portal hypertensive gastropathy (HCC)    Esophageal varices without bleeding (HCC)    Seizure disorder (HCC) 09/17/2020   Alcoholic cirrhosis of liver without ascites (HCC) 09/17/2020   Acute hepatic encephalopathy (HCC) 09/17/2020   Hypomagnesemia 09/17/2020   Suspected CVA (cerebral vascular accident) (HCC) 09/17/2020   Hypokalemia 09/17/2020   Seizures (HCC) 07/09/2020   High bilirubin 07/09/2020   Anemia 07/09/2020   Thrombocytopenia (HCC) 07/09/2020   COVID-19 virus infection 07/09/2020   GERD (gastroesophageal reflux disease) 07/09/2020   Pancreatic mass 11/29/2019   Alcoholic hepatitis 11/23/2019   Gallstones 11/23/2019   Alcohol abuse 11/23/2019   PCP:  Newlin,  Corrina, MD Pharmacy:   [facility] [address] [place] [address] Phone: [phone] Fax: [phone]   Social Drivers of Health (SDOH) Social History: SDOH Screenings   Food Insecurity: No Food Insecurity (01/05/2024)   Housing: Low Risk  (01/05/2024)  Transportation Needs: No Transportation Needs (01/05/2024)  Utilities: Not At Risk (01/05/2024)  Alcohol Screen: Low Risk  (05/13/2023)  Depression (PHQ2-9): High Risk (11/17/2023)  Financial Resource Strain: High Risk (04/11/2023)  Physical Activity: Patient Declined (05/13/2023)  Social Connections: Socially Isolated (05/13/2023)  Stress: No Stress Concern Present (05/13/2023)  Tobacco Use: Medium Risk (01/06/2024)  Health Literacy: Adequate Health Literacy (05/13/2023)   SDOH Interventions: None indicated     Readmission Risk Interventions    01/06/2024    9:34 AM  Readmission Risk Prevention Plan  Transportation Screening Complete  PCP or Specialist Appt within 5-7 Days Complete  Home Care Screening Complete  Medication Review (RN CM) Complete    Heather Saltness, MSW, LCSW 01/06/2024 9:38 AM

## 2024-01-06 NOTE — Anesthesia Procedure Notes (Signed)
 Anesthesia Regional Block: Adductor canal block   Pre-Anesthetic Checklist: , timeout performed,  Correct Patient, Correct Site, Correct Laterality,  Correct Procedure, Correct Position, site marked,  Risks and benefits discussed,  Surgical consent,  Pre-op evaluation,  At surgeon's request and post-op pain management  Laterality: Right  Prep: Maximum Sterile Barrier Precautions used, chloraprep       Needles:  Injection technique: Single-shot  Needle Type: Echogenic Stimulator Needle     Needle Length: 9cm  Needle Gauge: 22     Additional Needles:   Procedures:,,,, ultrasound used (permanent image in chart),,    Narrative:  Start time: 01/06/2024 9:43 AM End time: 01/06/2024 9:45 AM Injection made incrementally with aspirations every 5 mL.  Performed by: Personally  Anesthesiologist: Merla Almarie HERO, DO  Additional Notes: Monitors applied. No increased pain on injection. No increased resistance to injection. Injection made in 5cc increments. Good needle visualization. Patient tolerated procedure well.

## 2024-01-06 NOTE — Transfer of Care (Signed)
 Immediate Anesthesia Transfer of Care Note  Patient: Gary Mitchell  Procedure(s) Performed: OPEN REDUCTION INTERNAL FIXATION (ORIF) ANKLE FRACTURE (Right: Ankle)  Patient Location: PACU  Anesthesia Type:GA combined with regional for post-op pain  Level of Consciousness: drowsy  Airway & Oxygen Therapy: Patient Spontanous Breathing and Patient connected to face mask oxygen  Post-op Assessment: Report given to RN and Post -op Vital signs reviewed and stable  Post vital signs: Reviewed and stable  Last Vitals:  Vitals Value Taken Time  BP 109/67 01/06/24 12:18  Temp    Pulse 80 01/06/24 12:20  Resp 17 01/06/24 12:20  SpO2 100 % 01/06/24 12:20  Vitals shown include unfiled device data.  Last Pain:  Vitals:   01/06/24 1015  TempSrc:   PainSc: Asleep         Complications: No notable events documented.

## 2024-01-06 NOTE — Hospital Course (Addendum)
 Brief Narrative:   53 y.o. male with medical history significant of alcoholic hepatitis, anxiety, liver cirrhosis, coagulopathy, depression, type 2 diabetes, dyspnea, neuropathy of the feet, pancreatitis, pancreatic pseudocyst, renal disorder, seizures, thrombocytopenia who was referred by Dr. Evalene Murphy's office for medical admission with plans to operate his right ankle fracture.    Assessment & Plan:  Principal Problem:   Closed right ankle fracture Active Problems:   GERD (gastroesophageal reflux disease)   Alcohol abuse   Type 2 diabetes mellitus (HCC)   Paroxysmal atrial fibrillation (HCC)   Seizure disorder (HCC)   Hypomagnesemia   Hypokalemia   Hyperlipidemia    Closed right ankle fracture Plan is for ORIF per orthopedic today.  Postop management per their service including pain control, wound care management, therapy recommendations and DVT prophylaxis     Hypokalemia/hypomagnesemia Replete as needed     GERD (gastroesophageal reflux disease) Continue PPI.     Alcohol abuse In remission according to the patient.     Type 2 diabetes mellitus (HCC) Semglee  10 units, Jardiance , sliding scale and Accu-Chek.     Paroxysmal atrial fibrillation (HCC) Not on any chronic anticoagulation Toprol -XL     Seizure disorder (HCC) Continue Keppra  500 mg p.o. twice daily.     Hyperlipidemia Hold hold atorvastatin  due to abnormal LFTs.  DVT prophylaxis: SCDs Start: 01/05/24 1347    Code Status: Full Code Family Communication:   Status is: Inpatient Remains inpatient appropriate because: OR today    Subjective: Doing ok. S/p ORIF   Examination:  General exam: Appears calm and comfortable  Respiratory system: Clear to auscultation. Respiratory effort normal. Cardiovascular system: S1 & S2 heard, RRR. No JVD, murmurs, rubs, gallops or clicks. No pedal edema. Gastrointestinal system: Abdomen is nondistended, soft and nontender. No organomegaly or masses felt. Normal  bowel sounds heard. Central nervous system: Alert and oriented. No focal neurological deficits. Extremities: Symmetric 5 x 5 power. RLE dressing in place.  Skin: No rashes, lesions or ulcers Psychiatry: Judgement and insight appear normal. Mood & affect appropriate.

## 2024-01-06 NOTE — Anesthesia Procedure Notes (Signed)
 Procedure Name: LMA Insertion Date/Time: 01/06/2024 10:29 AM  Performed by: Therisa Doyal CROME, CRNAPatient Re-evaluated:Patient Re-evaluated prior to induction Oxygen Delivery Method: Circle system utilized Preoxygenation: Pre-oxygenation with 100% oxygen Induction Type: IV induction LMA: LMA inserted LMA Size: 4.0 Number of attempts: 1 Placement Confirmation: positive ETCO2 and breath sounds checked- equal and bilateral Tube secured with: Tape Dental Injury: Teeth and Oropharynx as per pre-operative assessment

## 2024-01-06 NOTE — Op Note (Addendum)
 01/05/2024 - 01/06/2024  11:47 AM  PATIENT:  Gary Mitchell    PRE-OPERATIVE DIAGNOSIS:  RIGHT ANKLE FRACTURE  POST-OPERATIVE DIAGNOSIS:  Same  PROCEDURE:  OPEN REDUCTION INTERNAL FIXATION (ORIF) ANKLE FRACTURE  SURGEON:  [name], MD  ASSISTANT: Gerard Large, PA-C, he was present and scrubbed throughout the case, critical for completion in a timely fashion, and for retraction, instrumentation, and closure.   ANESTHESIA:   gen   PREOPERATIVE INDICATIONS:  Gary Mitchell is a  53 y.o. male with a diagnosis of RIGHT ANKLE FRACTURE who failed conservative measures and elected for surgical management.    The risks benefits and alternatives were discussed with the patient preoperatively including but not limited to the risks of infection, bleeding, nerve injury, cardiopulmonary complications, the need for revision surgery, among others, and the patient was willing to proceed.  OPERATIVE IMPLANTS: stryker plate, and tightrope  OPERATIVE FINDINGS: Unstable ankle fracture. Stable syndesmosis post op  BLOOD LOSS: min  COMPLICATIONS: none  TOURNIQUET TIME:  OPERATIVE PROCEDURE:  Patient was identified in the preoperative holding area and site was marked by me He was transported to the operating theater and placed on the table in supine position taking care to pad all bony prominences. After a preincinduction time out anesthesia was induced. The right lower extremity was prepped and draped in normal sterile fashion and a pre-incision timeout was performed. Gary Mitchell received ancef for preoperative antibiotics.   I made a lateral incision of roughly 7 cm dissection was carried down sharply to the distal fibula and then spreading dissection was used proximally to protect the superficial peroneal nerve. I sharply incised the periosteum and took care to protect the peroneal tendons. I then debrided the fracture site and performed a reduction maneuver which was held in place  with a clamp.   I reduced the fracture to length  I then selected a fibular plate and placed in a neutralization fashion care was taken distally so as not to penetrate the joint with the cancellus screws.  I then turned my attention medially where I created a 4 cm incision and dissected sharply down to the medial Mal fracture taking care to protect the saphenous vein. Via the fracture I explored the joint and removed a loose body of bone from the joint.   I debrided the fracture and reduced and held in place with a tenaculum. I then drilled and placed 2 partially threaded 45 mm cannulated screws one anterior and one posterior across the fracture.   I then stressed the syndesmosis and it was unstable for syndesmotic fixation I performed a reduction maneuver with a and placed a tightrope  I assesed the posterior mal piece and it was small enough to not require fixation as it involved less than 20% of the articular surface  The wound was then thoroughly irrigated and closed using a 0 Vicryl and absorbable Monocryl sutures. He was placed in a short leg splint.   POST OPERATIVE PLAN: Non-weightbearing. DVT prophylaxis will consist of mobilization and chemical px

## 2024-01-06 NOTE — Plan of Care (Signed)
  Problem: Clinical Measurements: Goal: Will remain free from infection Outcome: Progressing   Problem: Activity: Goal: Risk for activity intolerance will decrease Outcome: Progressing   Problem: Pain Managment: Goal: General experience of comfort will improve and/or be controlled Outcome: Progressing

## 2024-01-06 NOTE — Discharge Instructions (Signed)
 POST-OPERATIVE OPIOID TAPER INSTRUCTIONS: It is important to wean off of your opioid medication as soon as possible. If you do not need pain medication after your surgery it is ok to stop day one. Opioids include: Codeine, Hydrocodone (Norco, Vicodin), Oxycodone (Percocet, oxycontin ) and hydromorphone  amongst others.  Long term and even short term use of opiods can cause: Increased pain response Dependence Constipation Depression Respiratory depression And more.  Withdrawal symptoms can include Flu like symptoms Nausea, vomiting And more Techniques to manage these symptoms Hydrate well Eat regular healthy meals Stay active Use relaxation techniques(deep breathing, meditating, yoga) Do Not substitute Alcohol to help with tapering If you have been on opioids for less than two weeks and do not have pain than it is ok to stop all together.  Plan to wean off of opioids This plan should start within one week post op of your joint replacement. Maintain the same interval or time between taking each dose and first decrease the dose.  Cut the total daily intake of opioids by one tablet each day Next start to increase the time between doses. The last dose that should be eliminated is the evening dose.   Information on my medicine - ELIQUIS  (apixaban )  This medication education was reviewed with me or my healthcare representative as part of my discharge preparation.   Why was Eliquis  prescribed for you? Eliquis  was prescribed for you to reduce the risk of blood clots forming after orthopedic surgery.    What do You need to know about Eliquis ? Take your Eliquis  TWICE DAILY - one tablet in the morning and one tablet in the evening with or without food.  It would be best to take the dose about the same time each day.  If you have difficulty swallowing the tablet whole please discuss with your pharmacist how to take the medication safely.  Take Eliquis  exactly as prescribed by your doctor  and DO NOT stop taking Eliquis  without talking to the doctor who prescribed the medication.  Stopping without other medication to take the place of Eliquis  may increase your risk of developing a clot.  After discharge, you should have regular check-up appointments with your healthcare provider that is prescribing your Eliquis .  What do you do if you miss a dose? If a dose of ELIQUIS  is not taken at the scheduled time, take it as soon as possible on the same day and twice-daily administration should be resumed.  The dose should not be doubled to make up for a missed dose.  Do not take more than one tablet of ELIQUIS  at the same time.  Important Safety Information A possible side effect of Eliquis  is bleeding. You should call your healthcare provider right away if you experience any of the following: Bleeding from an injury or your nose that does not stop. Unusual colored urine (red or dark brown) or unusual colored stools (red or black). Unusual bruising for unknown reasons. A serious fall or if you hit your head (even if there is no bleeding).  Some medicines may interact with Eliquis  and might increase your risk of bleeding or clotting while on Eliquis . To help avoid this, consult your healthcare provider or pharmacist prior to using any new prescription or non-prescription medications, including herbals, vitamins, non-steroidal anti-inflammatory drugs (NSAIDs) and supplements.  This website has more information on Eliquis  (apixaban ): http://www.eliquis .com/eliquis dena

## 2024-01-06 NOTE — Interval H&P Note (Signed)
 History and Physical Interval Note:  01/06/2024 7:25 AM  Gary Mitchell  has presented today for surgery, with the diagnosis of RIGHT ANKLE FRACTURE.  The various methods of treatment have been discussed with the patient and family. After consideration of risks, benefits and other options for treatment, the patient has consented to  Procedure(s): OPEN REDUCTION INTERNAL FIXATION (ORIF) ANKLE FRACTURE (Right) as a surgical intervention.  The patient's history has been reviewed, patient examined, no change in status, stable for surgery.  I have reviewed the patient's chart and labs.  Questions were answered to the patient's satisfaction.     [name]

## 2024-01-06 NOTE — Progress Notes (Signed)
 PT Cancellation Note  Patient Details Name: Gary Mitchell MRN: [id number] DOB: [birth year]   Cancelled Treatment:    Reason Eval/Treat Not Completed: Patient not medically ready  Patient is scheduled for surgery today. Darice Potters PT Acute Rehabilitation Services Office [phone]  Potters Darice Norris 01/06/2024, 9:06 AM

## 2024-01-07 ENCOUNTER — Other Ambulatory Visit (HOSPITAL_COMMUNITY): Payer: Self-pay

## 2024-01-07 ENCOUNTER — Encounter (HOSPITAL_COMMUNITY): Payer: Self-pay | Admitting: Orthopedic Surgery

## 2024-01-07 DIAGNOSIS — R569 Unspecified convulsions: Secondary | ICD-10-CM | POA: Diagnosis not present

## 2024-01-07 DIAGNOSIS — K219 Gastro-esophageal reflux disease without esophagitis: Secondary | ICD-10-CM

## 2024-01-07 DIAGNOSIS — N179 Acute kidney failure, unspecified: Secondary | ICD-10-CM | POA: Diagnosis not present

## 2024-01-07 DIAGNOSIS — S82891A Other fracture of right lower leg, initial encounter for closed fracture: Secondary | ICD-10-CM | POA: Diagnosis not present

## 2024-01-07 DIAGNOSIS — E119 Type 2 diabetes mellitus without complications: Secondary | ICD-10-CM | POA: Diagnosis not present

## 2024-01-07 DIAGNOSIS — I480 Paroxysmal atrial fibrillation: Secondary | ICD-10-CM | POA: Diagnosis not present

## 2024-01-07 DIAGNOSIS — Z794 Long term (current) use of insulin: Secondary | ICD-10-CM | POA: Diagnosis not present

## 2024-01-07 LAB — CBC
HCT: 27 % — ABNORMAL LOW (ref 39.0–52.0)
Hemoglobin: 8.6 g/dL — ABNORMAL LOW (ref 13.0–17.0)
MCH: 23.6 pg — ABNORMAL LOW (ref 26.0–34.0)
MCHC: 31.9 g/dL (ref 30.0–36.0)
MCV: 74.2 fL — ABNORMAL LOW (ref 80.0–100.0)
Platelets: 167 10*3/uL (ref 150–400)
RBC: 3.64 MIL/uL — ABNORMAL LOW (ref 4.22–5.81)
RDW: 22.8 % — ABNORMAL HIGH (ref 11.5–15.5)
WBC: 9.2 10*3/uL (ref 4.0–10.5)
nRBC: 0 % (ref 0.0–0.2)

## 2024-01-07 LAB — BASIC METABOLIC PANEL WITH GFR
Anion gap: 10 (ref 5–15)
BUN: 19 mg/dL (ref 6–20)
CO2: 20 mmol/L — ABNORMAL LOW (ref 22–32)
Calcium: 8.6 mg/dL — ABNORMAL LOW (ref 8.9–10.3)
Chloride: 102 mmol/L (ref 98–111)
Creatinine, Ser: 0.67 mg/dL (ref 0.61–1.24)
GFR, Estimated: >60 mL/min (ref >60)
Glucose, Bld: 212 mg/dL — ABNORMAL HIGH (ref 70–99)
Potassium: 4.3 mmol/L (ref 3.5–5.1)
Sodium: 132 mmol/L — ABNORMAL LOW (ref 135–145)

## 2024-01-07 LAB — GLUCOSE, CAPILLARY
Glucose-Capillary: 178 mg/dL — ABNORMAL HIGH (ref 70–99)
Glucose-Capillary: 189 mg/dL — ABNORMAL HIGH (ref 70–99)
Glucose-Capillary: 226 mg/dL — ABNORMAL HIGH (ref 70–99)
Glucose-Capillary: 222 mg/dL — ABNORMAL HIGH (ref 70–99)

## 2024-01-07 LAB — MAGNESIUM: Magnesium: 1.8 mg/dL (ref 1.7–2.4)

## 2024-01-07 LAB — PHOSPHORUS: Phosphorus: 3.7 mg/dL (ref 2.5–4.6)

## 2024-01-07 MED ORDER — DULOXETINE HCL 60 MG PO CPEP
60.0000 mg | ORAL_CAPSULE | Freq: Every day | ORAL | Status: DC
  Administered 2024-01-08: 60 mg via ORAL
  Filled 2024-01-07: qty 1

## 2024-01-07 NOTE — Progress Notes (Signed)
 [facility]  PROGRESS NOTE  Gary Mitchell FMW:[id number] DOB: Jul 18, 1970 DOA: 01/05/2024 PCP: Delbert Clam, MD   Brief HPI:   53 y.o. male with medical history significant of alcoholic hepatitis, anxiety, liver cirrhosis, coagulopathy, depression, type 2 diabetes, dyspnea, neuropathy of the feet, pancreatitis, pancreatic pseudocyst, renal disorder, seizures, thrombocytopenia who was referred by Dr. Evalene Murphy's office for medical admission with plans to operate his right ankle fracture.      Assessment/Plan:   Closed right ankle fracture Plan is for ORIF per orthopedic today.   Postop management per their service including pain control, wound care management, therapy recommendations and DVT prophylaxis   Hypokalemia/hypomagnesemia Replete    GERD (gastroesophageal reflux disease) Continue PPI.   Alcohol abuse In remission according to the patient.   Type 2 diabetes mellitus (HCC) Semglee  10 units, Jardiance , sliding scale and Accu-Chek.     Paroxysmal atrial fibrillation (HCC) Not on any chronic anticoagulation Toprol -XL     Seizure disorder (HCC) Continue Keppra  500 mg p.o. twice daily.     Hyperlipidemia Hold hold atorvastatin  due to abnormal LFTs.    Medications     acetaminophen   1,000 mg Oral Q6H   Chlorhexidine Gluconate Cloth  6 each Topical Daily   docusate sodium   100 mg Oral BID   DULoxetine   30 mg Oral Daily   empagliflozin   10 mg Oral QAC breakfast   enoxaparin  (LOVENOX ) injection  40 mg Subcutaneous Q24H   insulin  aspart  0-9 Units Subcutaneous TID WC   insulin  glargine-yfgn  10 Units Subcutaneous Daily   levETIRAcetam   500 mg Oral BID   metoprolol  succinate  12.5 mg Oral Daily   mupirocin ointment  1 Application Nasal BID   pantoprazole   40 mg Oral BID     Data Reviewed:   CBG:  Recent Labs  Lab 01/06/24 1220 01/06/24 1307 01/06/24 1655 01/06/24 2046 01/07/24 0752  GLUCAP 150* 184* 166* 348* 178*    SpO2: 100 % O2  Flow Rate (L/min): 6 L/min    Vitals:   01/06/24 1300 01/06/24 1305 01/06/24 2048 01/07/24 0511  BP: 107/64 106/67 118/78 116/73  Pulse: 74 75 80 80  Resp: 12 16 18 20   Temp: 98.5 F (36.9 C) 98.3 F (36.8 C) 99 F (37.2 C) 98.3 F (36.8 C)  TempSrc:  Oral  Oral  SpO2: 95% 98% 95% 100%  Weight:      Height:          Data Reviewed:  Basic Metabolic Panel: Recent Labs  Lab 01/05/24 1404 01/06/24 0519 01/06/24 1339 01/07/24 0519  NA 137 136  --  132*  K 3.0* 3.8  --  4.3  CL 102 102  --  102  CO2 23 20*  --  20*  GLUCOSE 96 146*  --  212*  BUN 9 12  --  19  CREATININE 0.55* 0.65 0.78 0.67  CALCIUM  9.2 9.3  --  8.6*  MG 1.3*  --   --  1.8  PHOS 2.6  --   --  3.7    CBC: Recent Labs  Lab 01/05/24 1404 01/06/24 0519 01/07/24 0519  WBC 7.5 9.4 9.2  HGB 9.3* 9.6* 8.6*  HCT 29.5* 31.5* 27.0*  MCV 74.5* 76.5* 74.2*  PLT 180 187 167    LFT Recent Labs  Lab 01/05/24 1404 01/06/24 0519  AST 51* 44*  ALT 22 20  ALKPHOS 187* 178*  BILITOT 2.5* 2.2*  PROT 8.8* 8.2*  ALBUMIN 3.5 3.2*  Antibiotics: Anti-infectives (From admission, onward)    Start     Dose/Rate Route Frequency Ordered Stop   01/06/24 1600  ceFAZolin (ANCEF) IVPB 2g/100 mL premix        2 g 200 mL/hr over 30 Minutes Intravenous Every 6 hours 01/06/24 1306 01/07/24 0133   01/06/24 1136  vancomycin (VANCOCIN) powder  Status:  Discontinued          As needed 01/06/24 1136 01/06/24 1214   01/06/24 0915  ceFAZolin (ANCEF) IVPB 2g/100 mL premix        2 g 200 mL/hr over 30 Minutes Intravenous On call to O.R. 01/06/24 0912 01/06/24 1050   01/06/24 0915  vancomycin (VANCOCIN) IVPB 1000 mg/200 mL premix        1,000 mg 200 mL/hr over 60 Minutes Intravenous  Once 01/06/24 0912 01/06/24 1039        DVT prophylaxis: Lovenox   Code Status: Full code  Family Communication: No family at bedside   CONSULTS    Subjective   Denies pain   Objective    Physical  Examination:   General-appears in no acute distress Heart-S1-S2, regular, no murmur auscultated Lungs-clear to auscultation bilaterally, no wheezing or crackles auscultated Abdomen-soft, nontender, no organomegaly Extremities-right lower extremity in dressing Neuro-alert, oriented x3, no focal deficit noted  Status is: Inpatient:             Sabas GORMAN Brod   [facility] If 7PM-7AM, please contact night-coverage at [email or web address], Office  [phone]   01/07/2024, 8:22 AM  LOS: 1 day

## 2024-01-07 NOTE — Progress Notes (Signed)
 Subjective: Patient reports pain as mild to moderate.  Tolerating diet.  Urinating.   No CP, SOB.  Has mobilized OOB with PT. Having trouble maintaining NWB RLE status. Has same issue pre-op and walked on splint.  Objective:   VITALS:   Vitals:   01/06/24 2048 01/07/24 0511 01/07/24 0828 01/07/24 1113  BP: 118/78 116/73 129/75 104/74  Pulse: 80 80 76 84  Resp: 18 20 16 18   Temp: 99 F (37.2 C) 98.3 F (36.8 C)  97.7 F (36.5 C)  TempSrc:  Oral  Oral  SpO2: 95% 100% 100% 100%  Weight:      Height:          Latest Ref Rng & Units 01/07/2024    5:19 AM 01/06/2024    5:19 AM 01/05/2024    2:04 PM  CBC  WBC 4.0 - 10.5 K/uL 9.2  9.4  7.5   Hemoglobin 13.0 - 17.0 g/dL 8.6  9.6  9.3   Hematocrit 39.0 - 52.0 % 27.0  31.5  29.5   Platelets 150 - 400 K/uL 167  187  180       Latest Ref Rng & Units 01/07/2024    5:19 AM 01/06/2024    1:39 PM 01/06/2024    5:19 AM  BMP  Glucose 70 - 99 mg/dL 787   853   BUN 6 - 20 mg/dL 19   12   Creatinine 9.38 - 1.24 mg/dL 9.32  9.21  9.34   Sodium 135 - 145 mmol/L 132   136   Potassium 3.5 - 5.1 mmol/L 4.3   3.8   Chloride 98 - 111 mmol/L 102   102   CO2 22 - 32 mmol/L 20   20   Calcium  8.9 - 10.3 mg/dL 8.6   9.3    Intake/Output      07/01 0701 07/02 0700 07/02 0701 07/03 0700   P.O. 520 480   I.V. (mL/kg) 500 (7.7)    IV Piggyback 316.6    Total Intake(mL/kg) 1336.6 (20.5) 480 (7.4)   Urine (mL/kg/hr) 1000 (0.6) 500 (0.7)   Stool 0    Total Output 1000 500   Net +336.6 -20        Stool Occurrence 1 x        Assessment: 1 Day Post-Op  S/P Procedure(s) (LRB): OPEN REDUCTION INTERNAL FIXATION (ORIF) ANKLE FRACTURE (Right) by Dr. Evalene BIRCH. Beverley on 01/06/24  Principal Problem:   Closed right ankle fracture Active Problems:   Alcohol abuse   GERD (gastroesophageal reflux disease)   Seizure disorder (HCC)   Hypomagnesemia   Hypokalemia   Type 2 diabetes mellitus (HCC)   Hyperlipidemia   Paroxysmal atrial fibrillation  (HCC)    Plan: Reinforce the need to be NWB RLE or can damage the fracture repair which was already worse intra-op than we expected likely due to him walking on it prior to surgery  Advance diet Up with therapy Incentive Spirometry Elevate and Apply ice  Weightbearing: NWB RLE Insicional and dressing care: Dressings left intact until follow-up and Reinforce dressings as needed Orthopedic device(s): Splint Showering: Keep dressing dry VTE prophylaxis: Lovenox  40mg  qd while inpatient, can switch to ASA upon d/c, SCDs, ambulation Pain control: PRN Follow - up plan: 01/16/24 at 10am Contact information:  Evalene Beverley MD, Gary Large PA-C  Dispo: TBD. Notes from PT/OT sound like he will be given HEP and d/c to aunt's house. Ok to discharge from ortho standpoint once medically  ready and stable d/c plan confirmed.     Gary CHRISTELLA Large, PA-C Office [phone] 01/07/2024, 5:27 PM

## 2024-01-07 NOTE — TOC Progression Note (Signed)
 Transition of Care [facility]) - Progression Note    Patient Details  Name: Gary Mitchell MRN: [id number] Date of Birth: 08-07-1970  [facility] of [facility]) CM/SW Contact  Caven Perine, Nathanel, RN Phone Number: 01/07/2024, 9:57 AM  Clinical Narrative: OT no f/u.Recc shower chair.-patient agreed to accept no preference DME-[employer] rep Jermaine to deliver shower chair to rm prior d/c.Await PT recc.Patient will call for own transport home.      Expected Discharge Plan: Home/Self Care Barriers to Discharge: Continued Medical Work up  Expected Discharge Plan and Services In-house Referral: Clinical Social Work Discharge Planning Services: CM Consult Post Acute Care Choice: Resumption of Svcs/PTA Provider Living arrangements for the past 2 months: Single Family Home                 DME Arranged: N/A DME Agency: NA       HH Arranged: NA HH Agency: NA         Social Determinants of Health (SDOH) Interventions SDOH Screenings   Food Insecurity: No Food Insecurity (01/05/2024)  Housing: Low Risk  (01/05/2024)  Transportation Needs: No Transportation Needs (01/05/2024)  Utilities: Not At Risk (01/05/2024)  Alcohol Screen: Low Risk  (05/13/2023)  Depression (PHQ2-9): High Risk (11/17/2023)  Financial Resource Strain: High Risk (04/11/2023)  Physical Activity: Patient Declined (05/13/2023)  Social Connections: Socially Isolated (05/13/2023)  Stress: No Stress Concern Present (05/13/2023)  Tobacco Use: Medium Risk (01/06/2024)  Health Literacy: Adequate Health Literacy (05/13/2023)    Readmission Risk Interventions    01/06/2024    9:34 AM  Readmission Risk Prevention Plan  Transportation Screening Complete  PCP or Specialist Appt within 5-7 Days Complete  Home Care Screening Complete  Medication Review (RN CM) Complete
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
## 2024-01-07 NOTE — Plan of Care (Signed)
  Problem: Clinical Measurements: Goal: Will remain free from infection Outcome: Progressing   Problem: Activity: Goal: Risk for activity intolerance will decrease Outcome: Progressing   Problem: Pain Managment: Goal: General experience of comfort will improve and/or be controlled Outcome: Progressing   Problem: Safety: Goal: Ability to remain free from injury will improve Outcome: Progressing

## 2024-01-07 NOTE — Anesthesia Postprocedure Evaluation (Signed)
 Anesthesia Post Note  Patient: NYAL SCHACHTER  Procedure(s) Performed: OPEN REDUCTION INTERNAL FIXATION (ORIF) ANKLE FRACTURE (Right: Ankle)     Patient location during evaluation: PACU Anesthesia Type: Regional and General Level of consciousness: awake and alert, oriented and patient cooperative Pain management: pain level controlled Vital Signs Assessment: post-procedure vital signs reviewed and stable Respiratory status: spontaneous breathing, nonlabored ventilation and respiratory function stable Cardiovascular status: blood pressure returned to baseline and stable Postop Assessment: no apparent nausea or vomiting Anesthetic complications: no   No notable events documented.  Last Vitals:  Vitals:   01/06/24 2048 01/07/24 0511  BP: 118/78 116/73  Pulse: 80 80  Resp: 18 20  Temp: 37.2 C 36.8 C  SpO2: 95% 100%    Last Pain:  Vitals:   01/07/24 0603  TempSrc:   PainSc: 1                  Almarie CHRISTELLA Marchi
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
## 2024-01-08 ENCOUNTER — Other Ambulatory Visit: Payer: Self-pay

## 2024-01-08 ENCOUNTER — Other Ambulatory Visit (HOSPITAL_COMMUNITY): Payer: Self-pay

## 2024-01-08 DIAGNOSIS — Z794 Long term (current) use of insulin: Secondary | ICD-10-CM | POA: Diagnosis not present

## 2024-01-08 DIAGNOSIS — K219 Gastro-esophageal reflux disease without esophagitis: Secondary | ICD-10-CM | POA: Diagnosis not present

## 2024-01-08 DIAGNOSIS — I480 Paroxysmal atrial fibrillation: Secondary | ICD-10-CM

## 2024-01-08 DIAGNOSIS — N179 Acute kidney failure, unspecified: Secondary | ICD-10-CM | POA: Diagnosis not present

## 2024-01-08 DIAGNOSIS — F1010 Alcohol abuse, uncomplicated: Secondary | ICD-10-CM | POA: Diagnosis not present

## 2024-01-08 DIAGNOSIS — R569 Unspecified convulsions: Secondary | ICD-10-CM | POA: Diagnosis not present

## 2024-01-08 DIAGNOSIS — S82891A Other fracture of right lower leg, initial encounter for closed fracture: Secondary | ICD-10-CM | POA: Diagnosis not present

## 2024-01-08 DIAGNOSIS — E119 Type 2 diabetes mellitus without complications: Secondary | ICD-10-CM | POA: Diagnosis not present

## 2024-01-08 LAB — CBC
HCT: 26.4 % — ABNORMAL LOW (ref 39.0–52.0)
Hemoglobin: 8.3 g/dL — ABNORMAL LOW (ref 13.0–17.0)
MCH: 23.4 pg — ABNORMAL LOW (ref 26.0–34.0)
MCHC: 31.4 g/dL (ref 30.0–36.0)
MCV: 74.4 fL — ABNORMAL LOW (ref 80.0–100.0)
Platelets: 140 10*3/uL — ABNORMAL LOW (ref 150–400)
RBC: 3.55 MIL/uL — ABNORMAL LOW (ref 4.22–5.81)
RDW: 22.7 % — ABNORMAL HIGH (ref 11.5–15.5)
WBC: 10 10*3/uL (ref 4.0–10.5)
nRBC: 0 % (ref 0.0–0.2)

## 2024-01-08 LAB — GLUCOSE, CAPILLARY
Glucose-Capillary: 137 mg/dL — ABNORMAL HIGH (ref 70–99)
Glucose-Capillary: 254 mg/dL — ABNORMAL HIGH (ref 70–99)

## 2024-01-08 LAB — BASIC METABOLIC PANEL WITH GFR
Anion gap: 6 (ref 5–15)
BUN: 18 mg/dL (ref 6–20)
CO2: 22 mmol/L (ref 22–32)
Calcium: 8.7 mg/dL — ABNORMAL LOW (ref 8.9–10.3)
Chloride: 106 mmol/L (ref 98–111)
Creatinine, Ser: 0.66 mg/dL (ref 0.61–1.24)
GFR, Estimated: >60 mL/min (ref >60)
Glucose, Bld: 146 mg/dL — ABNORMAL HIGH (ref 70–99)
Potassium: 3.9 mmol/L (ref 3.5–5.1)
Sodium: 134 mmol/L — ABNORMAL LOW (ref 135–145)

## 2024-01-08 LAB — MAGNESIUM: Magnesium: 1.7 mg/dL (ref 1.7–2.4)

## 2024-01-08 MED ORDER — APIXABAN 2.5 MG PO TABS
2.5000 mg | ORAL_TABLET | Freq: Two times a day (BID) | ORAL | 0 refills | Status: DC
  Filled 2024-01-08: qty 30, 15d supply, fill #0
  Filled 2024-01-08: qty 26, 13d supply, fill #0

## 2024-01-08 MED ORDER — ORAL CARE MOUTH RINSE: 15.0000 mL | OROMUCOSAL | Status: DC | PRN

## 2024-01-08 MED ORDER — METOPROLOL SUCCINATE ER 25 MG PO TB24
12.5000 mg | ORAL_TABLET | Freq: Every day | ORAL | 1 refills | Status: DC
  Filled 2024-01-08: qty 45, 90d supply, fill #0

## 2024-01-08 MED ORDER — OXYCODONE HCL 5 MG PO TABS
5.0000 mg | ORAL_TABLET | Freq: Four times a day (QID) | ORAL | 0 refills | Status: DC | PRN
  Filled 2024-01-08: qty 15, 4d supply, fill #0

## 2024-01-08 MED ORDER — PANTOPRAZOLE SODIUM 40 MG PO TBEC
40.0000 mg | DELAYED_RELEASE_TABLET | Freq: Two times a day (BID) | ORAL | 3 refills | Status: DC
  Filled 2024-01-08: qty 60, 30d supply, fill #0

## 2024-01-08 MED ORDER — LEVETIRACETAM 500 MG PO TABS
500.0000 mg | ORAL_TABLET | Freq: Two times a day (BID) | ORAL | 1 refills | Status: DC
  Filled 2024-01-08: qty 90, 45d supply, fill #0

## 2024-01-08 NOTE — Plan of Care (Signed)

## 2024-01-08 NOTE — Progress Notes (Signed)
 AVS reviewed w/ pt who verbalized an understanding. TOC meds in a secure bag delivered to pt by this RN- Pt dressed for home. No  there questions at this time.

## 2024-01-08 NOTE — Inpatient Diabetes Management (Signed)
 Inpatient Diabetes Program Recommendations  AACE/ADA: New Consensus Statement on Inpatient Glycemic Control (2015)  Target Ranges:  Prepandial:   less than 140 mg/dL      Peak postprandial:   less than 180 mg/dL (1-2 hours)      Critically ill patients:  140 - 180 mg/dL   Lab Results  Component Value Date   GLUCAP 254 (H) 01/08/2024   HGBA1C 7.1 (H) 12/30/2023    Review of Glycemic Control  Diabetes history: DM2 Outpatient Diabetes medications: Jardiance  10 QAM, Lantus  30 daily, Humalog  10 TID, metformin  1000 mg BID Current orders for Inpatient glycemic control: Jardiance  10 mg daily, Semglee  10 daily, Novolog  0-9 TID  Inpatient Diabetes Program Recommendations:    Consider adding Novolog  3-4 units TID for meal coverage insulin .   Continue to follow.  Thank you. Shona Brandy, RD, LDN, CDCES Inpatient Diabetes Coordinator [phone]

## 2024-01-08 NOTE — Plan of Care (Signed)

## 2024-01-08 NOTE — Progress Notes (Incomplete)
 [facility]  PROGRESS NOTE  GAETANO ROMBERGER FMW:[id number] DOB: 15-Jul-1970 DOA: 01/05/2024 PCP: Delbert Clam, MD   Brief HPI:   53 y.o. male with medical history significant of alcoholic hepatitis, anxiety, liver cirrhosis, coagulopathy, depression, type 2 diabetes, dyspnea, neuropathy of the feet, pancreatitis, pancreatic pseudocyst, renal disorder, seizures, thrombocytopenia who was referred by Dr. Evalene Murphy's office for medical admission with plans to operate his right ankle fracture.      Assessment/Plan:   Closed right ankle fracture Plan is for ORIF per orthopedic today.   Postop management per their service including pain control, wound care management, therapy recommendations and DVT prophylaxis   Hypokalemia/hypomagnesemia Replete    GERD (gastroesophageal reflux disease) Continue PPI.   Alcohol abuse In remission according to the patient.   Type 2 diabetes mellitus (HCC) Semglee  10 units, Jardiance , sliding scale and Accu-Chek.     Paroxysmal atrial fibrillation (HCC) Not on any chronic anticoagulation Toprol -XL     Seizure disorder (HCC) Continue Keppra  500 mg p.o. twice daily.     Hyperlipidemia Hold hold atorvastatin  due to abnormal LFTs.    Medications     Chlorhexidine Gluconate Cloth  6 each Topical Daily   docusate sodium   100 mg Oral BID   DULoxetine   60 mg Oral Daily   empagliflozin   10 mg Oral QAC breakfast   enoxaparin  (LOVENOX ) injection  40 mg Subcutaneous Q24H   insulin  aspart  0-9 Units Subcutaneous TID WC   insulin  glargine-yfgn  10 Units Subcutaneous Daily   levETIRAcetam   500 mg Oral BID   metoprolol  succinate  12.5 mg Oral Daily   mupirocin ointment  1 Application Nasal BID   pantoprazole   40 mg Oral BID     Data Reviewed:   CBG:  Recent Labs  Lab 01/07/24 0752 01/07/24 1109 01/07/24 1648 01/07/24 2030 01/08/24 0749  GLUCAP 178* 189* 226* 222* 137*    SpO2: 99 % O2 Flow Rate (L/min): 6 L/min    Vitals:    01/07/24 1113 01/07/24 1738 01/07/24 2029 01/08/24 0526  BP: 104/74 113/73 111/69 114/80  Pulse: 84 71 71 72  Resp: 18  16 16   Temp: 97.7 F (36.5 C)  98.1 F (36.7 C) 98.3 F (36.8 C)  TempSrc: Oral  Oral Oral  SpO2: 100%  99% 99%  Weight:      Height:          Data Reviewed:  Basic Metabolic Panel: Recent Labs  Lab 01/05/24 1404 01/06/24 0519 01/06/24 1339 01/07/24 0519 01/08/24 0510  NA 137 136  --  132* 134*  K 3.0* 3.8  --  4.3 3.9  CL 102 102  --  102 106  CO2 23 20*  --  20* 22  GLUCOSE 96 146*  --  212* 146*  BUN 9 12  --  19 18  CREATININE 0.55* 0.65 0.78 0.67 0.66  CALCIUM  9.2 9.3  --  8.6* 8.7*  MG 1.3*  --   --  1.8 1.7  PHOS 2.6  --   --  3.7  --     CBC: Recent Labs  Lab 01/05/24 1404 01/06/24 0519 01/07/24 0519 01/08/24 0510  WBC 7.5 9.4 9.2 10.0  HGB 9.3* 9.6* 8.6* 8.3*  HCT 29.5* 31.5* 27.0* 26.4*  MCV 74.5* 76.5* 74.2* 74.4*  PLT 180 187 167 140*    LFT Recent Labs  Lab 01/05/24 1404 01/06/24 0519  AST 51* 44*  ALT 22 20  ALKPHOS 187* 178*  BILITOT 2.5*  2.2*  PROT 8.8* 8.2*  ALBUMIN 3.5 3.2*     Antibiotics: Anti-infectives (From admission, onward)    Start     Dose/Rate Route Frequency Ordered Stop   01/06/24 1600  ceFAZolin (ANCEF) IVPB 2g/100 mL premix        2 g 200 mL/hr over 30 Minutes Intravenous Every 6 hours 01/06/24 1306 01/07/24 0133   01/06/24 1136  vancomycin (VANCOCIN) powder  Status:  Discontinued          As needed 01/06/24 1136 01/06/24 1214   01/06/24 0915  ceFAZolin (ANCEF) IVPB 2g/100 mL premix        2 g 200 mL/hr over 30 Minutes Intravenous On call to O.R. 01/06/24 0912 01/06/24 1050   01/06/24 0915  vancomycin (VANCOCIN) IVPB 1000 mg/200 mL premix        1,000 mg 200 mL/hr over 60 Minutes Intravenous  Once 01/06/24 0912 01/06/24 1039        DVT prophylaxis: Lovenox   Code Status: Full code  Family Communication: No family at bedside   CONSULTS    Subjective      Objective     Physical Examination:     Status is: Inpatient:             Sabas GORMAN Brod   [facility] If 7PM-7AM, please contact night-coverage at [email or web address], Office  [phone]   01/08/2024, 8:22 AM  LOS: 1 day
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
## 2024-01-08 NOTE — TOC Transition Note (Signed)
 Transition of Care [facility]) - Discharge Note   Patient Details  Name: Gary Mitchell MRN: [id number] Date of Birth: 1971-03-16  [facility] CM/SW Contact:  Sonda Manuella Quill, RN Phone Number: 01/08/2024, 2:32 PM   Clinical Narrative:    Orders received for HHPT; spoke w/ pt in room; pt declined HHPT; pt notified he can contact his PCP if he would like to receive this service later; no TOC needs.   Final next level of care: Home/Self Care Barriers to Discharge: No Barriers Identified   Patient Goals and CMS Choice Patient states their goals for this hospitalization and ongoing recovery are:: Home CMS Medicare.gov Compare Post Acute Care list provided to:: Patient Choice offered to / list presented to : Patient      Discharge Placement                       Discharge Plan and Services Additional resources added to the After Visit Summary for   In-house Referral: Clinical Social Work Discharge Planning Services: CM Consult Post Acute Care Choice: Resumption of Svcs/PTA Provider          DME Arranged: N/A DME Agency: NA       HH Arranged: Patient Refused HH HH Agency: NA        Social Drivers of Health (SDOH) Interventions SDOH Screenings   Food Insecurity: No Food Insecurity (01/05/2024)  Housing: Low Risk  (01/05/2024)  Transportation Needs: No Transportation Needs (01/05/2024)  Utilities: Not At Risk (01/05/2024)  Alcohol Screen: Low Risk  (05/13/2023)  Depression (PHQ2-9): High Risk (11/17/2023)  Financial Resource Strain: High Risk (04/11/2023)  Physical Activity: Patient Declined (05/13/2023)  Social Connections: Socially Isolated (05/13/2023)  Stress: No Stress Concern Present (05/13/2023)  Tobacco Use: Medium Risk (01/06/2024)  Health Literacy: Adequate Health Literacy (05/13/2023)     Readmission Risk Interventions    01/06/2024    9:34 AM  Readmission Risk Prevention Plan  Transportation Screening Complete  PCP or Specialist Appt within  5-7 Days Complete  Home Care Screening Complete  Medication Review (RN CM) Complete

## 2024-01-12 ENCOUNTER — Other Ambulatory Visit (HOSPITAL_COMMUNITY): Payer: Self-pay

## 2024-01-17 DIAGNOSIS — Z419 Encounter for procedure for purposes other than remedying health state, unspecified: Secondary | ICD-10-CM | POA: Diagnosis not present

## 2024-01-18 ENCOUNTER — Other Ambulatory Visit: Payer: Self-pay

## 2024-01-18 ENCOUNTER — Emergency Department (HOSPITAL_COMMUNITY)

## 2024-01-18 ENCOUNTER — Emergency Department (HOSPITAL_COMMUNITY)
Admission: EM | Admit: 2024-01-18 | Discharge: 2024-01-18 | Disposition: A | Discharge status: Home / Self Care | Attending: Emergency Medicine | Admitting: Emergency Medicine

## 2024-01-18 ENCOUNTER — Encounter (HOSPITAL_COMMUNITY): Payer: Self-pay

## 2024-01-18 DIAGNOSIS — Z7984 Long term (current) use of oral hypoglycemic drugs: Secondary | ICD-10-CM | POA: Diagnosis not present

## 2024-01-18 DIAGNOSIS — M7989 Other specified soft tissue disorders: Secondary | ICD-10-CM | POA: Diagnosis not present

## 2024-01-18 DIAGNOSIS — Z794 Long term (current) use of insulin: Secondary | ICD-10-CM | POA: Diagnosis not present

## 2024-01-18 DIAGNOSIS — Z5900 Homelessness unspecified: Secondary | ICD-10-CM | POA: Insufficient documentation

## 2024-01-18 DIAGNOSIS — Z8781 Personal history of (healed) traumatic fracture: Secondary | ICD-10-CM

## 2024-01-18 DIAGNOSIS — E1165 Type 2 diabetes mellitus with hyperglycemia: Secondary | ICD-10-CM | POA: Insufficient documentation

## 2024-01-18 DIAGNOSIS — X58XXXD Exposure to other specified factors, subsequent encounter: Secondary | ICD-10-CM | POA: Insufficient documentation

## 2024-01-18 DIAGNOSIS — Z7901 Long term (current) use of anticoagulants: Secondary | ICD-10-CM | POA: Insufficient documentation

## 2024-01-18 DIAGNOSIS — Z8616 Personal history of COVID-19: Secondary | ICD-10-CM | POA: Insufficient documentation

## 2024-01-18 DIAGNOSIS — S82891D Other fracture of right lower leg, subsequent encounter for closed fracture with routine healing: Secondary | ICD-10-CM | POA: Diagnosis not present

## 2024-01-18 DIAGNOSIS — R569 Unspecified convulsions: Secondary | ICD-10-CM

## 2024-01-18 DIAGNOSIS — I10 Essential (primary) hypertension: Secondary | ICD-10-CM | POA: Diagnosis not present

## 2024-01-18 DIAGNOSIS — E1169 Type 2 diabetes mellitus with other specified complication: Secondary | ICD-10-CM

## 2024-01-18 DIAGNOSIS — M25571 Pain in right ankle and joints of right foot: Secondary | ICD-10-CM | POA: Diagnosis present

## 2024-01-18 LAB — CBC WITH DIFFERENTIAL/PLATELET
Abs Immature Granulocytes: 0.01 K/uL (ref 0.00–0.07)
Basophils Absolute: 0.1 K/uL (ref 0.0–0.1)
Basophils Relative: 1 %
Eosinophils Absolute: 0.4 K/uL (ref 0.0–0.5)
Eosinophils Relative: 6 %
HCT: 30.5 % — ABNORMAL LOW (ref 39.0–52.0)
Hemoglobin: 9.5 g/dL — ABNORMAL LOW (ref 13.0–17.0)
Immature Granulocytes: 0 %
Lymphocytes Relative: 32 %
Lymphs Abs: 2.5 K/uL (ref 0.7–4.0)
MCH: 23.8 pg — ABNORMAL LOW (ref 26.0–34.0)
MCHC: 31.1 g/dL (ref 30.0–36.0)
MCV: 76.4 fL — ABNORMAL LOW (ref 80.0–100.0)
Monocytes Absolute: 0.7 K/uL (ref 0.1–1.0)
Monocytes Relative: 9 %
Neutro Abs: 4 K/uL (ref 1.7–7.7)
Neutrophils Relative %: 52 %
Platelets: 90 K/uL — ABNORMAL LOW (ref 150–400)
RBC: 3.99 MIL/uL — ABNORMAL LOW (ref 4.22–5.81)
RDW: 25.1 % — ABNORMAL HIGH (ref 11.5–15.5)
WBC: 7.7 K/uL (ref 4.0–10.5)
nRBC: 0 % (ref 0.0–0.2)

## 2024-01-18 LAB — BASIC METABOLIC PANEL WITH GFR
Anion gap: 12 (ref 5–15)
BUN: 6 mg/dL (ref 6–20)
CO2: 24 mmol/L (ref 22–32)
Calcium: 8.6 mg/dL — ABNORMAL LOW (ref 8.9–10.3)
Chloride: 105 mmol/L (ref 98–111)
Creatinine, Ser: <0.3 mg/dL — ABNORMAL LOW (ref 0.61–1.24)
Glucose, Bld: 297 mg/dL — ABNORMAL HIGH (ref 70–99)
Potassium: 3.9 mmol/L (ref 3.5–5.1)
Sodium: 141 mmol/L (ref 135–145)

## 2024-01-18 LAB — CBG MONITORING, ED: Glucose-Capillary: 282 mg/dL — ABNORMAL HIGH (ref 70–99)

## 2024-01-18 MED ORDER — LANTUS SOLOSTAR 100 UNIT/ML ~~LOC~~ SOPN
30.0000 [IU] | PEN_INJECTOR | Freq: Every day | SUBCUTANEOUS | 1 refills | Status: DC
  Filled 2024-01-18: qty 27, 90d supply, fill #0

## 2024-01-18 MED ORDER — INSULIN LISPRO (1 UNIT DIAL) 100 UNIT/ML (KWIKPEN)
10.0000 [IU] | PEN_INJECTOR | Freq: Three times a day (TID) | SUBCUTANEOUS | 3 refills | Status: DC
  Filled 2024-01-18: qty 27, 90d supply, fill #0

## 2024-01-18 MED ORDER — LEVETIRACETAM 500 MG PO TABS
500.0000 mg | ORAL_TABLET | Freq: Two times a day (BID) | ORAL | 1 refills | Status: DC
  Filled 2024-01-18: qty 90, 45d supply, fill #0

## 2024-01-18 NOTE — Progress Notes (Signed)
 Orthopedic Tech Progress Note Patient Details:  Gary Mitchell 07-30-1970 [id number]  Ortho Devices Type of Ortho Device: Post (short leg) splint, Stirrup splint Ortho Device/Splint Location: rle Ortho Device/Splint Interventions: Ordered, Application, Adjustment  I removed splint at drs request then replaced it. Post Interventions Patient Tolerated: Well Instructions Provided: Care of device, Adjustment of device  Chandra Dorn PARAS 01/18/2024, 5:18 AM

## 2024-01-18 NOTE — Care Management (Signed)
 Patient to follow up with Evalene Chancy, call on Monday to schedule appt, information on AVS
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
## 2024-01-18 NOTE — ED Provider Notes (Signed)
 [place] EMERGENCY DEPARTMENT AT [facility] Provider Note  CSN: [id number] Arrival date & time: 01/18/24 0206  Chief Complaint(s) Post-op Problem  HPI Gary Mitchell is a 53 y.o. male with a past medical history listed below including recent ORIF of right ankle fracture/dislocation here for issue with his wrists splint.  Patient is noted blood at unwrapping of the splint.  Patient has been ambulating on the extremity despite nonweightbearing status recommended by orthopedic surgery.  Patient endorses discomfort without overt pain.  Reports that he has missed follow-up appointments because they called him the day before the appointment and he was unable to coordinate transportation through [facility].  Additionally he reports that he is homeless and has been out of his diabetes and seizure medicine.  Denies any other physical complaints.  The history is provided by the patient.    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   Seizures (HCC)    Thrombocytopenia [facility])    Patient Active Problem List   Diagnosis Date Noted   Closed right ankle fracture 01/05/2024   Depression 11/17/2023   Pancreatic cyst 04/30/2023   Microcytic anemia 04/30/2023   Alcohol use disorder 04/30/2023   Acute metabolic encephalopathy 03/31/2023   Encounter for medication review and counseling 11/13/2022   Paroxysmal  atrial fibrillation (HCC) 08/08/2022   Hypercoagulable state due to paroxysmal atrial fibrillation (HCC) 08/08/2022   Malnutrition of moderate degree (HCC) 05/08/2022   Benign neoplasm of descending colon    Symptomatic anemia 05/05/2022   Hyperlipidemia 05/05/2022   Hyperammonemia (HCC) 05/03/2022   Type 2 diabetes mellitus (HCC) 05/29/2021   Portal hypertensive gastropathy (HCC)    Esophageal varices without bleeding (HCC)    Seizure disorder (HCC) 09/17/2020   Alcoholic cirrhosis of liver without ascites (HCC) 09/17/2020   Acute hepatic encephalopathy (HCC) 09/17/2020   Hypomagnesemia 09/17/2020   Suspected CVA (cerebral vascular accident) (HCC) 09/17/2020   Hypokalemia 09/17/2020   Seizures (HCC) 07/09/2020   High bilirubin 07/09/2020   Anemia 07/09/2020   Thrombocytopenia (HCC) 07/09/2020   COVID-19 virus infection 07/09/2020   GERD (gastroesophageal reflux disease) 07/09/2020   Pancreatic mass 11/29/2019   Alcoholic hepatitis 11/23/2019   Gallstones 11/23/2019   Alcohol abuse 11/23/2019   Home Medication(s) Prior to Admission medications   Medication Sig Start Date End Date Taking? Authorizing Provider  apixaban  (ELIQUIS ) 2.5 MG TABS tablet Take 1 tablet (2.5 mg total) by mouth 2 (two) times daily for 28 days. 01/08/24 02/05/24  Drusilla Sabas RAMAN, MD  atorvastatin  (LIPITOR) 20 MG tablet Take 1 tablet (20 mg total) by mouth daily. 04/02/23   Laurence Locus, DO  chlorhexidine  (HIBICLENS ) 4 % external liquid Apply 15 mLs (1 Application total) topically as directed for 30 doses. Use as directed daily for 5 days every other week for 6 weeks. 01/06/24  Gawne, Meghan M, PA-C  Continuous Glucose Sensor (FREESTYLE LIBRE 3 PLUS SENSOR) MISC Change sensor every 15 days. Use to check blood sugar continuously. 12/15/23   Newlin, Enobong, MD  DULoxetine  (CYMBALTA ) 60 MG capsule Take 1 capsule (60 mg total) by mouth daily. 05/13/23   Newlin, Enobong, MD  empagliflozin  (JARDIANCE ) 10 MG TABS tablet Take 1  tablet (10 mg total) by mouth daily before breakfast. 11/17/23   Newlin, Enobong, MD  furosemide  (LASIX ) 20 MG tablet Take 1 tablet (20 mg total) by mouth daily as needed. Patient taking differently: Take 20 mg by mouth daily as needed for edema or fluid. 04/02/23   Laurence Locus, DO  insulin  glargine (LANTUS  SOLOSTAR) 100 UNIT/ML Solostar Pen Inject 30 Units into the skin daily. 01/18/24   Trine Raynell Moder, MD  insulin  lispro (HUMALOG  KWIKPEN) 100 UNIT/ML KwikPen Inject 10 Units into the skin 3 (three) times daily. 01/18/24   Helio Lack, Raynell Moder, MD  lactulose  (CHRONULAC ) 10 GM/15ML solution Take 45 mLs (30 g total) by mouth as needed for mild constipation. 12/08/23   Beather Delon Gibson, PA  levETIRAcetam  (KEPPRA ) 500 MG tablet Take 1 tablet (500 mg total) by mouth 2 (two) times daily. 01/18/24 04/17/24  Trine Raynell Moder, MD  metFORMIN  (GLUCOPHAGE ) 500 MG tablet Take 2 tablets (1,000 mg total) by mouth 2 (two) times daily with a meal. 07/11/23   Delbert Clam, MD  metoprolol  succinate (TOPROL -XL) 25 MG 24 hr tablet Take 0.5 tablets (12.5 mg total) by mouth daily. 01/08/24   Drusilla Sabas RAMAN, MD  mupirocin  ointment (BACTROBAN ) 2 % Place 1 Application into the nose 2 (two) times daily for 60 doses. Use as directed 2 times daily for 5 days every other week for 6 weeks. 01/06/24 02/07/24  Gawne, Meghan M, PA-C  oxyCODONE  (OXY IR/ROXICODONE ) 5 MG immediate release tablet Take 1 tablet (5 mg total) by mouth every 6 (six) hours as needed for moderate pain (pain score 4-6). 01/08/24   Drusilla Sabas RAMAN, MD  pantoprazole  (PROTONIX ) 40 MG tablet Take 1 tablet (40 mg total) by mouth 2 (two) times daily. 01/08/24   Drusilla Sabas RAMAN, MD  senna-docusate (SENOKOT-S) 8.6-50 MG tablet Take 1 tablet by mouth at bedtime as needed for mild constipation. Patient not taking: Reported on 01/05/2024 12/19/23   Long, Fonda MATSU, MD                                                                                                                                     Allergies Nsaids and Iohexol   Review of Systems Review of Systems As noted in HPI  Physical Exam Vital Signs  I have reviewed the triage vital signs BP (!) 137/91   Pulse 92   Temp 98 F (36.7 C)   Resp 16   SpO2 100%   Physical Exam Vitals reviewed.  Constitutional:      General: He is not in acute  distress.    Appearance: He is well-developed. He is not diaphoretic.  HENT:     Head: Normocephalic and atraumatic.     Right Ear: External ear normal.     Left Ear: External ear normal.     Nose: Nose normal.     Mouth/Throat:     Mouth: Mucous membranes are moist.  Eyes:     General: No scleral icterus.    Conjunctiva/sclera: Conjunctivae normal.  Neck:     Trachea: Phonation normal.  Cardiovascular:     Rate and Rhythm: Normal rate and regular rhythm.  Pulmonary:     Effort: Pulmonary effort is normal. No respiratory distress.     Breath sounds: No stridor.  Abdominal:     General: There is no distension.  Musculoskeletal:        General: Normal range of motion.     Cervical back: Normal range of motion.       Legs:     Comments: Splint removed and surgical incisions are C/D/I with sutures in place. No erythema or discharge. No TTP.  Neurological:     Mental Status: He is alert and oriented to person, place, and time.  Psychiatric:        Behavior: Behavior normal.     ED Results and Treatments Labs (all labs ordered are listed, but only abnormal results are displayed) Labs Reviewed  CBC WITH DIFFERENTIAL/PLATELET - Abnormal; Notable for the following components:      Result Value   RBC 3.99 (*)    Hemoglobin 9.5 (*)    HCT 30.5 (*)    MCV 76.4 (*)    MCH 23.8 (*)    RDW 25.1 (*)    Platelets 90 (*)    All other components within normal limits  BASIC METABOLIC PANEL WITH GFR - Abnormal; Notable for the following components:   Glucose, Bld 297 (*)    Creatinine, Ser <0.30 (*)    Calcium  8.6 (*)    All other components within  normal limits  CBG MONITORING, ED - Abnormal; Notable for the following components:   Glucose-Capillary 282 (*)    All other components within normal limits                                                                                                                         EKG  EKG Interpretation Date/Time:  Sunday January 18 2024 03:35:41 EDT Ventricular Rate:  90 PR Interval:  178 QRS Duration:  93 QT Interval:  377 QTC Calculation: 462 R Axis:   68  Text Interpretation: Sinus rhythm Confirmed by Trine Likes [phone]) on 01/18/2024 3:44:56 AM       Radiology DG Ankle Complete Right Result Date: 01/18/2024 CLINICAL DATA:  The patient underwent right ankle bimalleolar ORIF with trans-syndesmotic cable fixation 01/06/2024, presents to ER with pain and swelling postoperatively. EXAM: RIGHT ANKLE - COMPLETE 3+ VIEW COMPARISON:  Preoperative study 12/18/2023 FINDINGS: Three views.  Fiberglass casting is in  place, obscuring fine detail. There is a lateral sideplate ORIF against the distal fibular shaft and malleolus, 2 threaded ORIF screws through the medial malleolus, and trans-syndesmotic cable fixation plates and drill tract. Postsurgical fracture alignment approximates anatomic. A rim of callus formation extends over the posterior malleolus versus a raised periosteal reaction. Infection is in the differential but there are no destructive bone changes. There is still noted widening of the medial ankle mortise and narrowing of the lateral mortise, unchanged. No new fracture has become apparent. Arthritic changes are not seen. IMPRESSION: 1. Status post bimalleolar ORIF with trans-syndesmotic cable fixation. 2. Postsurgical fracture alignment approximates anatomic. 3. A rim of callus formation extends over the posterior malleolus versus a raised periosteal reaction. Infection is in the differential but there are no destructive bone changes. 4. Persistent widening of the medial ankle mortise and  narrowing of the lateral mortise. Electronically Signed   By: Francis Quam M.D.   On: 01/18/2024 05:48    Medications Ordered in ED Medications - No data to display Procedures Procedures  (including critical care time) Medical Decision Making / ED Course   Medical Decision Making Amount and/or Complexity of Data Reviewed Labs: ordered. Decision-making details documented in ED Course. Radiology: ordered and independent interpretation performed. Decision-making details documented in ED Course. ECG/medicine tests: ordered and independent interpretation performed. Decision-making details documented in ED Course.  Risk Prescription drug management.    Worn splints due to noncompliance with nonweightbearing status.  Blood appears to be old. Wound itself is well-appearing and no evidence of infection.  X-ray without hardware instability. Another splint applied.  History of diabetes.  CBG 280s.  Labs otherwise reassuring without DKA. Prescription for below meds given  TOC to assist with coordinating Ortho follow up.    Final Clinical Impression(s) / ED Diagnoses Final diagnoses:  H/O fracture of ankle   The patient appears reasonably screened and/or stabilized for discharge and I doubt any other medical condition or other [facility] requiring further screening, evaluation, or treatment in the ED at this time. I have discussed the findings, Dx and Tx plan with the patient/family who expressed understanding and agree(s) with the plan. Discharge instructions discussed at length. The patient/family was given strict return precautions who verbalized understanding of the instructions. No further questions at time of discharge.  Disposition: Discharge  Condition: Good  ED Discharge Orders          Ordered    insulin  glargine (LANTUS  SOLOSTAR) 100 UNIT/ML Solostar Pen  Daily       Note to Pharmacy: Dose increase   01/18/24 0624    insulin  lispro (HUMALOG  KWIKPEN) 100 UNIT/ML KwikPen  3  times daily        01/18/24 0624    levETIRAcetam  (KEPPRA ) 500 MG tablet  2 times daily       Note to Pharmacy: Deliver to bedside please   01/18/24 0624             Follow Up: Delbert Clam, MD [address] [place] [address] [phone]  Call  to schedule an appointment for close follow up    This chart was dictated using voice recognition software.  Despite best efforts to proofread,  errors can occur which can change the documentation meaning.    Trine Raynell Moder, MD 01/18/24 [phone]

## 2024-01-19 ENCOUNTER — Other Ambulatory Visit (HOSPITAL_COMMUNITY): Payer: Self-pay

## 2024-01-19 ENCOUNTER — Other Ambulatory Visit: Payer: Self-pay

## 2024-01-19 ENCOUNTER — Ambulatory Visit: Payer: Self-pay | Admitting: Pharmacist

## 2024-02-02 ENCOUNTER — Ambulatory Visit (INDEPENDENT_AMBULATORY_CARE_PROVIDER_SITE_OTHER): Admitting: Orthopedic Surgery

## 2024-02-02 ENCOUNTER — Encounter: Payer: Self-pay | Admitting: Orthopedic Surgery

## 2024-02-02 DIAGNOSIS — T847XXA Infection and inflammatory reaction due to other internal orthopedic prosthetic devices, implants and grafts, initial encounter: Secondary | ICD-10-CM | POA: Diagnosis not present

## 2024-02-02 DIAGNOSIS — S82851D Displaced trimalleolar fracture of right lower leg, subsequent encounter for closed fracture with routine healing: Secondary | ICD-10-CM | POA: Diagnosis not present

## 2024-02-02 DIAGNOSIS — Z7689 Persons encountering health services in other specified circumstances: Secondary | ICD-10-CM | POA: Diagnosis not present

## 2024-02-02 NOTE — Progress Notes (Signed)
 Office Visit Note   Patient: Gary Mitchell           Date of Birth: 01-18-1971           MRN: [id number] Visit Date: 02/02/2024              Requested by: Delbert Clam, MD [address] [place],  [place] [address] PCP: Delbert Clam, MD  Chief Complaint  Patient presents with   Right Ankle - Pain, Open Wound      HPI: Patient is a 53 year old gentleman who was seen for initial evaluation for infected hardware right ankle.  Patient is status post open reduction internal fixation for trimalleolar right ankle fracture on first.  Patient is currently ambulating in a cam boot with crutches.  Patient states she has had odor and drainage she is not on antibiotics.  Patient states he would not be able to afford antibiotics if they were called in.  Assessment & Plan: Visit Diagnoses:  1. 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  Discussed that we would remove the deep retained hardware as well as the FiberWire suture.  Risk and benefits were discussed including persistent infection and need for additional surgery.  Patient states she understands wished to proceed at this time plan for outpatient surgery Wednesday.  Follow-Up Instructions: Return in about 2 weeks (around 02/16/2024).   Ortho Exam  Patient is alert, oriented, no adenopathy, well-dressed, normal affect, normal respiratory effort. Examination patient has a strong palpable dorsalis pedis pulse his foot is plantigrade no angular deformity.  Patient has dehiscence of the surgical incision over the lateral ankle.  Using a Q-tip this ulcer probes down to the hardware for the length of the incision.  There is no purulent drainage.  With palpation the incision is tender but I cannot express any fluid.  Most recent hemoglobin A1c 7.1.  Patient states that sugars generally run between 150 and 200.    Imaging: No results  found. No images are attached to the encounter.  Labs: Lab Results  Component Value Date   HGBA1C 7.1 (H) 12/30/2023   HGBA1C 8.6 (A) 10/14/2023   HGBA1C 8.3 (A) 07/11/2023   CRP 1.4 (H) 07/09/2020     Lab Results  Component Value Date   ALBUMIN 3.2 (L) 01/06/2024   ALBUMIN 3.5 01/05/2024   ALBUMIN 3.4 (L) 12/18/2023   PREALBUMIN 14 (L) 05/06/2022    Lab Results  Component Value Date   MG 1.7 01/08/2024   MG 1.8 01/07/2024   MG 1.3 (L) 01/05/2024   No results found for: VD25OH  Lab Results  Component Value Date   PREALBUMIN 14 (L) 05/06/2022      Latest Ref Rng & Units 01/18/2024    3:43 AM 01/08/2024    5:10 AM 01/07/2024    5:19 AM  CBC EXTENDED  WBC 4.0 - 10.5 K/uL 7.7  10.0  9.2   RBC 4.22 - 5.81 MIL/uL 3.99  3.55  3.64   Hemoglobin 13.0 - 17.0 g/dL 9.5  8.3  8.6   HCT 60.9 - 52.0 % 30.5  26.4  27.0   Platelets 150 - 400 K/uL 90  140  167   NEUT# 1.7 - 7.7 K/uL 4.0     Lymph# 0.7 - 4.0 K/uL 2.5        There is no height or weight on file to calculate BMI.  Orders:  No orders of the defined types were placed in this encounter.  No orders of the defined types were placed in this encounter.    Procedures: No procedures performed  Clinical Data: No additional findings.  ROS:  All other systems negative, except as noted in the HPI. Review of Systems  Objective: Vital Signs: There were no vitals taken for this visit.  Specialty Comments:  No specialty comments available.  PMFS History: Patient Active Problem List   Diagnosis Date Noted   Closed right ankle fracture 01/05/2024   Depression 11/17/2023   Pancreatic cyst 04/30/2023   Microcytic anemia 04/30/2023   Alcohol use disorder 04/30/2023   Acute metabolic encephalopathy 03/31/2023   Encounter for medication review and counseling 11/13/2022   Paroxysmal atrial fibrillation (HCC) 08/08/2022   Hypercoagulable state due to paroxysmal atrial fibrillation (HCC) 08/08/2022   Malnutrition  of moderate degree (HCC) 05/08/2022   Benign neoplasm of descending colon    Symptomatic anemia 05/05/2022   Hyperlipidemia 05/05/2022   Hyperammonemia (HCC) 05/03/2022   Type 2 diabetes mellitus (HCC) 05/29/2021   Portal hypertensive gastropathy (HCC)    Esophageal varices without bleeding (HCC)    Seizure disorder (HCC) 09/17/2020   Alcoholic cirrhosis of liver without ascites (HCC) 09/17/2020   Acute hepatic encephalopathy (HCC) 09/17/2020   Hypomagnesemia 09/17/2020   Suspected CVA (cerebral vascular accident) (HCC) 09/17/2020   Hypokalemia 09/17/2020   Seizures (HCC) 07/09/2020   High bilirubin 07/09/2020   Anemia 07/09/2020   Thrombocytopenia (HCC) 07/09/2020   COVID-19 virus infection 07/09/2020   GERD (gastroesophageal reflux disease) 07/09/2020   Pancreatic mass 11/29/2019   Alcoholic hepatitis 11/23/2019   Gallstones 11/23/2019   Alcohol abuse 11/23/2019   Past Medical History:  Diagnosis Date   Alcoholic hepatitis 11/2019   Anemia    Anxiety    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   Seizures (HCC)    Thrombocytopenia (HCC)     Family History  Problem Relation Age of Onset   Diabetes Mellitus II Mother    Colon cancer Neg Hx    Esophageal cancer Neg Hx    Inflammatory bowel disease Neg Hx    Liver disease Neg Hx    Pancreatic cancer Neg Hx    Rectal cancer Neg Hx    Stomach cancer Neg Hx     Past Surgical History:  Procedure Laterality Date   BIOPSY  08/02/2021   Procedure: BIOPSY;   Surgeon: Teressa Toribio SQUIBB, MD;  Location: [facility];  Service: Endoscopy;;   BIOPSY  05/07/2022   Procedure: BIOPSY;  Surgeon: Stacia Glendia BRAVO, MD;  Location: [facility];  Service: Gastroenterology;;   COLONOSCOPY WITH PROPOFOL  N/A 05/08/2022   Procedure: COLONOSCOPY WITH PROPOFOL ;  Surgeon: Stacia Glendia BRAVO, MD;  Location: [facility];  Service: Gastroenterology;  Laterality: N/A;   ENTEROSCOPY N/A 09/20/2020   Procedure: ENTEROSCOPY;  Surgeon: Eda Iha, MD;  Location: [facility];  Service: Gastroenterology;  Laterality: N/A;  ENTEROSCOPY N/A 05/11/2022   Procedure: ENTEROSCOPY;  Surgeon: Stacia Glendia BRAVO, MD;  Location: [facility];  Service: Gastroenterology;  Laterality: N/A;   ESOPHAGOGASTRODUODENOSCOPY (EGD) WITH PROPOFOL  N/A 08/02/2021   Procedure: ESOPHAGOGASTRODUODENOSCOPY (EGD) WITH PROPOFOL ;  Surgeon: Teressa Toribio SQUIBB, MD;  Location: [facility];  Service: Endoscopy;  Laterality: N/A;   ESOPHAGOGASTRODUODENOSCOPY (EGD) WITH PROPOFOL  N/A 01/31/2022   Procedure: ESOPHAGOGASTRODUODENOSCOPY (EGD) WITH PROPOFOL ;  Surgeon: Teressa Toribio SQUIBB, MD;  Location: [facility];  Service: Gastroenterology;  Laterality: N/A;   ESOPHAGOGASTRODUODENOSCOPY (EGD) WITH PROPOFOL  N/A 05/07/2022   Procedure: ESOPHAGOGASTRODUODENOSCOPY (EGD) WITH PROPOFOL ;  Surgeon: Stacia Glendia BRAVO, MD;  Location: [facility];  Service: Gastroenterology;  Laterality: N/A;   ESOPHAGOGASTRODUODENOSCOPY (EGD) WITH PROPOFOL  N/A 06/13/2022   Procedure: ESOPHAGOGASTRODUODENOSCOPY (EGD) WITH PROPOFOL ;  Surgeon: Wilhelmenia Aloha Raddle., MD;  Location: [facility];  Service: Gastroenterology;  Laterality: N/A;   EUS N/A 01/31/2022   Procedure: UPPER ENDOSCOPIC ULTRASOUND (EUS) RADIAL;  Surgeon: Teressa Toribio SQUIBB, MD;  Location: [facility];  Service: Gastroenterology;  Laterality: N/A;   EUS N/A 06/13/2022   Procedure: UPPER ENDOSCOPIC ULTRASOUND (EUS) LINEAR;  Surgeon: Wilhelmenia Aloha Raddle., MD;  Location:  [facility];  Service: Gastroenterology;  Laterality: N/A;   FINE NEEDLE ASPIRATION N/A 08/02/2021   Procedure: FINE NEEDLE ASPIRATION (FNA) LINEAR;  Surgeon: Teressa Toribio SQUIBB, MD;  Location: [facility];  Service: Endoscopy;  Laterality: N/A;   FINE NEEDLE ASPIRATION N/A 01/31/2022   Procedure: FINE NEEDLE ASPIRATION (FNA) LINEAR;  Surgeon: Teressa Toribio SQUIBB, MD;  Location: [facility];  Service: Gastroenterology;  Laterality: N/A;   FINE NEEDLE ASPIRATION N/A 06/13/2022   Procedure: FINE NEEDLE ASPIRATION (FNA) LINEAR;  Surgeon: Wilhelmenia Aloha Raddle., MD;  Location: [facility];  Service: Gastroenterology;  Laterality: N/A;   GIVENS CAPSULE STUDY N/A 05/09/2022   Procedure: GIVENS CAPSULE STUDY;  Surgeon: Stacia Glendia BRAVO, MD;  Location: [facility];  Service: Gastroenterology;  Laterality: N/A;   HEMOSTASIS CLIP PLACEMENT  05/08/2022   Procedure: HEMOSTASIS CLIP PLACEMENT;  Surgeon: Stacia Glendia BRAVO, MD;  Location: [facility];  Service: Gastroenterology;;   HEMOSTASIS CLIP PLACEMENT  05/11/2022   Procedure: HEMOSTASIS CLIP PLACEMENT;  Surgeon: Stacia Glendia BRAVO, MD;  Location: [facility];  Service: Gastroenterology;;   HOT HEMOSTASIS N/A 06/13/2022   Procedure: HOT HEMOSTASIS (ARGON PLASMA COAGULATION/BICAP);  Surgeon: Wilhelmenia Aloha Raddle., MD;  Location: [facility];  Service: Gastroenterology;  Laterality: N/A;   left kidney removed     ORIF ANKLE FRACTURE Right 01/06/2024   Procedure: OPEN REDUCTION INTERNAL FIXATION (ORIF) ANKLE FRACTURE;  Surgeon: Beverley Evalene BIRCH, MD;  Location: [facility];  Service: Orthopedics;  Laterality: Right;   POLYPECTOMY  05/08/2022   Procedure: POLYPECTOMY;  Surgeon: Stacia Glendia BRAVO, MD;  Location: [facility];  Service: Gastroenterology;;   UPPER ESOPHAGEAL ENDOSCOPIC ULTRASOUND (EUS) N/A 08/02/2021   Procedure: UPPER ESOPHAGEAL ENDOSCOPIC ULTRASOUND (EUS);  Surgeon: Teressa Toribio SQUIBB, MD;  Location: [facility];  Service: Endoscopy;  Laterality:  N/A;  Radial and Linear   Social History   Occupational History   Not on file  Tobacco Use   Smoking status: Former    Current packs/day: 0.00    Average packs/day: 0.1 packs/day for 25.0 years (2.5 ttl pk-yrs)    Types: Cigarettes    Start date: 46    Quit date: 2019    Years since quitting: 6.5    Passive exposure: Past   Smokeless tobacco: Never   Tobacco comments:    Former smoker 08/08/22  Vaping Use  Vaping status: Never Used  Substance and Sexual Activity   Alcohol use: Yes    Comment: Last drink a week ago   Drug use: Not Currently    Types: Marijuana    Comment: Last use 6 years   Sexual activity: Not Currently

## 2024-02-03 ENCOUNTER — Encounter (HOSPITAL_COMMUNITY): Payer: Self-pay | Admitting: Orthopedic Surgery

## 2024-02-03 ENCOUNTER — Other Ambulatory Visit: Payer: Self-pay

## 2024-02-03 NOTE — Pre-Procedure Instructions (Signed)
 -------------  SDW INSTRUCTIONS given:  Your procedure is scheduled on 7/30.  Report to [facility] Main [facility]trance A at 12:50 P.M., and check in at the Admitting office.  Any questions or running late day of surgery: call [phone]    Remember:  Do not eat after midnight the night before your surgery  You may drink clear liquids until 12:20 PM the day of your surgery.   Clear liquids allowed are: Water , Non-Citrus Juices (without pulp), Carbonated Beverages, Clear Tea, Black Coffee Only, and Gatorade    Take these medicines the morning of surgery with A SIP OF WATER   atorvastatin , duloxetine , keppra , metoprolol , pantoprazole      Follow your surgeon's instructions on when to stop Eliquis .  If no instructions were given by your surgeon then you will need to call the office to get those instructions.    As of today, STOP taking any Aspirin (unless otherwise instructed by your surgeon) Aleve, Naproxen, Ibuprofen , Motrin , Advil , Goody's, BC's, all herbal medications, fish oil, and all vitamins.  WHAT DO I DO ABOUT MY DIABETES MEDICATION?   Do not take Metformin  the morning of surgery.  Hold JARDIANCE  72 hours.       THE MORNING OF SURGERY, take 15 units (50%) of insulin  glargine (LANTUS  SOLOSTAR)    If your CBG is greater than 220 mg/dL, you may take  of your sliding scale (correction) dose of insulin  lispro (HUMALOG  KWIKPEN)    HOW TO MANAGE YOUR DIABETES BEFORE AND AFTER SURGERY  Why is it important to control my blood sugar before and after surgery? Improving blood sugar levels before and after surgery helps healing and can limit problems. A way of improving blood sugar control is eating a healthy diet by:  Eating less sugar and carbohydrates  Increasing activity/exercise  Talking with your doctor about reaching your blood sugar goals High blood sugars (greater than 180 mg/dL) can raise your risk of infections and slow your recovery, so you will need to focus on  controlling your diabetes during the weeks before surgery. Make sure that the doctor who takes care of your diabetes knows about your planned surgery including the date and location.  How do I manage my blood sugar before surgery? Check your blood sugar at least 4 times a day, starting 2 days before surgery, to make sure that the level is not too high or low.  Check your blood sugar the morning of your surgery when you wake up and every 2 hours until you get to the Short Stay unit.  If your blood sugar is less than 70 mg/dL, you will need to treat for low blood sugar: Do not take insulin . Treat a low blood sugar (less than 70 mg/dL) with  cup of clear juice (cranberry or apple), 4 glucose tablets, OR glucose gel. Recheck blood sugar in 15 minutes after treatment (to make sure it is greater than 70 mg/dL). If your blood sugar is not greater than 70 mg/dL on recheck, call [phone] for further instructions. Report your blood sugar to the short stay nurse when you get to Short Stay.  If you are admitted to the hospital after surgery: Your blood sugar will be checked by the staff and you will probably be given insulin  after surgery (instead of oral diabetes medicines) to make sure you have good blood sugar levels. The goal for blood sugar control after surgery is 80-180 mg/dL.   Do NOT Smoke (Tobacco/Vaping) 24 hours prior to your procedure  If you use  a CPAP at night, you may bring all equipment for your overnight stay.     You will be asked to remove any contacts, glasses, piercing's, hearing aid's, dentures/partials prior to surgery. Please bring cases for these items if needed.     Patients discharged the day of surgery will not be allowed to drive home, and someone needs to stay with them for 24 hours.  SURGICAL WAITING ROOM VISITATION Patients may have no more than 2 support people in the waiting area - these visitors may rotate.   Pre-op nurse will coordinate an appropriate time  for 1 ADULT support person, who may not rotate, to accompany patient in pre-op.  Children under the age of 54 must have an adult with them who is not the patient and must remain in the main waiting area with an adult.  If the patient needs to stay at the hospital during part of their recovery, the visitor guidelines for inpatient rooms apply.  Please refer to the [facility] website for the visitor guidelines for any additional information.   Special instructions:   [place]- Preparing For Surgery   Please follow these instructions carefully.   Shower the NIGHT BEFORE SURGERY and the MORNING OF SURGERY with DIAL  Soap.   Pat yourself dry with a CLEAN TOWEL.  Wear CLEAN PAJAMAS to bed the night before surgery  Place CLEAN SHEETS on your bed the night of your first shower and DO NOT SLEEP WITH PETS.   Additional instructions for the day of surgery: DO NOT APPLY any lotions, deodorants, cologne, or perfumes.   Do not wear jewelry or makeup Do not wear nail polish, gel polish, artificial nails, or any other type of covering on natural nails (fingers and toes) Do not bring valuables to the hospital. [facility] is not responsible for valuables/personal belongings. Put on clean/comfortable clothes.  Please brush your teeth.  Ask your nurse before applying any prescription medications to the skin.

## 2024-02-03 NOTE — Progress Notes (Signed)
 PCP - Dr. Corrina Sabin Cardiologist - Dr. Candyce Reek  PPM/ICD - denies   Chest x-ray - 12/18/23 EKG - 01/18/24 Stress Test - denies ECHO - 09/13/22 Cardiac Cath - denies  CPAP - denies  Fasting Blood Sugar - 50-70 Checks Blood Sugar continuously/day (sensor on right arm)  Blood Thinner Instructions: Pt states he was not told to hold Eliquis . Pt was instructed to call surgeon's office ASAP to ask about holding. Last dose 7/28 around 2100  Aspirin Instructions: n/a  ERAS Protcol - clears until 1220  COVID TEST- n/a  Anesthesia review: yes, cardiac hx, eliquis   Patient verbally denies any shortness of breath, fever, cough and chest pain during phone call      Questions were answered. Patient verbalized understanding of instructions.

## 2024-02-03 NOTE — Anesthesia Preprocedure Evaluation (Signed)
 Anesthesia Evaluation  Patient identified by MRN, date of birth, ID band Patient awake    Reviewed: Allergy & Precautions, NPO status , Patient's Chart, lab work & pertinent test results, Unable to perform ROS - Chart review only  History of Anesthesia Complications Negative for: history of anesthetic complications  Airway Mallampati: III  TM Distance: >3 FB Neck ROM: Full    Dental  (+) Poor Dentition, Dental Advisory Given   Pulmonary neg pulmonary ROS, neg sleep apnea, neg COPD, Patient abstained from smoking.Not current smoker, former smoker   Pulmonary exam normal breath sounds clear to auscultation       Cardiovascular Exercise Tolerance: Good METS(-) hypertension(-) CAD and (-) Past MI Normal cardiovascular exam+ dysrhythmias Atrial Fibrillation  Rhythm:Regular Rate:Normal  Patient followed with cardiology for paroxysmal A-fib.  Last seen on 10/22/2022 by Dr. Dann.  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  Advised follow-up in 6 months but has been lost to follow-up.   Echo 09/13/2022:   1. Left ventricular ejection fraction, by estimation, is 55 to 60%. The left ventricle has normal function. The left ventricle has no regional wall motion abnormalities. Left ventricular diastolic parameters were normal. The average left ventricular global longitudinal strain is -17.9 %. The global longitudinal strain is normal.  2. Right ventricular systolic function is normal. The right ventricular size is normal. There is normal pulmonary artery systolic pressure.  3. Left atrial size was severely dilated.  4. Right atrial size was mildly dilated.  5. The mitral valve is normal in structure. Trivial mitral valve regurgitation. No evidence of mitral stenosis.  6. The aortic valve is tricuspid. Aortic valve regurgitation is not visualized. No aortic stenosis is present.  7. The  inferior vena cava is normal in size with greater than 50% respiratory variability, suggesting right atrial pressure of 3 mmHg.    Neuro/Psych Seizures - (poorly controlled d/t poor medication compliance), Poorly Controlled,  PSYCHIATRIC DISORDERS Anxiety Depression     Prior neurology notes suggest it's unclear wether patient has epilepsy versus seizures due to EtOH withdrawal.   Neuromuscular disease (peripheral neuropathy b/l LE) CVA    GI/Hepatic ,GERD  Controlled,,(+) Cirrhosis   Esophageal Varices  substance abuse  alcohol use, Hepatitis - (alcoholic hepatitis)  Endo/Other  diabetes, Well Controlled, Type 2, Insulin  Dependent, Oral Hypoglycemic Agents  Last A1c 7.1  Renal/GU negative Renal ROS  negative genitourinary   Musculoskeletal negative musculoskeletal ROS (+)    Abdominal   Peds  Hematology  (+) Blood dyscrasia, anemia Hb 9.6, plt 187   Anesthesia Other Findings Past Medical History: 11/2019: Alcoholic hepatitis No date: Anemia No date: Anxiety No date: Atrial fibrillation (HCC) 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 No date: Seizures (HCC) No date: Seizures (HCC) No date: Thrombocytopenia (HCC)  Reproductive/Obstetrics negative OB ROS                              Anesthesia Physical Anesthesia Plan  ASA: 3  Anesthesia Plan:  General   Post-op Pain Management:    Induction: Intravenous  PONV Risk Score and Plan: 2 and Ondansetron , Dexamethasone , Midazolam  and Treatment may vary due to age or medical condition  Airway Management Planned: LMA and Natural Airway  Additional Equipment: None  Intra-op Plan:   Post-operative Plan: Extubation in OR  Informed Consent: I have reviewed the patients History and Physical, chart, labs and discussed the procedure including the risks, benefits and alternatives for the proposed anesthesia with the patient or authorized representative who has indicated his/her understanding and acceptance.     Dental advisory given  Plan Discussed with: CRNA and Surgeon  Anesthesia Plan Comments: (Discussed risks of anesthesia with patient, including PONV, sore throat, lip/dental/eye damage. Rare risks discussed as well, such as cardiorespiratory and neurological sequelae, and allergic reactions. Discussed the role of CRNA in patient's perioperative care. Patient understands.)         Anesthesia Quick Evaluation
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
## 2024-02-03 NOTE — Progress Notes (Signed)
 Spoke with the pt to arrive at 1200 tom. He said he will try. Instructed to do NPO post mn, to stop clear liquids at 1120.

## 2024-02-04 ENCOUNTER — Ambulatory Visit (HOSPITAL_COMMUNITY): Payer: Self-pay | Admitting: Vascular Surgery

## 2024-02-04 ENCOUNTER — Other Ambulatory Visit: Payer: Self-pay

## 2024-02-04 ENCOUNTER — Encounter (HOSPITAL_COMMUNITY)
Admission: RE | Disposition: A | Discharge status: Home / Self Care | Payer: Self-pay | Source: Home / Self Care | Attending: Orthopedic Surgery

## 2024-02-04 ENCOUNTER — Inpatient Hospital Stay (HOSPITAL_COMMUNITY)
Admission: RE | Admit: 2024-02-04 | Discharge: 2024-02-06 | DRG: 464 | Disposition: A | Discharge status: Home / Self Care | Attending: Orthopedic Surgery | Admitting: Orthopedic Surgery

## 2024-02-04 DIAGNOSIS — T847XXA Infection and inflammatory reaction due to other internal orthopedic prosthetic devices, implants and grafts, initial encounter: Secondary | ICD-10-CM

## 2024-02-04 DIAGNOSIS — R569 Unspecified convulsions: Secondary | ICD-10-CM | POA: Diagnosis present

## 2024-02-04 DIAGNOSIS — Z91148 Patient's other noncompliance with medication regimen for other reason: Secondary | ICD-10-CM

## 2024-02-04 DIAGNOSIS — K862 Cyst of pancreas: Secondary | ICD-10-CM | POA: Diagnosis present

## 2024-02-04 DIAGNOSIS — Y831 Surgical operation with implant of artificial internal device as the cause of abnormal reaction of the patient, or of later complication, without mention of misadventure at the time of the procedure: Secondary | ICD-10-CM | POA: Diagnosis present

## 2024-02-04 DIAGNOSIS — E119 Type 2 diabetes mellitus without complications: Secondary | ICD-10-CM

## 2024-02-04 DIAGNOSIS — K7030 Alcoholic cirrhosis of liver without ascites: Secondary | ICD-10-CM | POA: Diagnosis present

## 2024-02-04 DIAGNOSIS — Z87891 Personal history of nicotine dependence: Secondary | ICD-10-CM

## 2024-02-04 DIAGNOSIS — F32A Depression, unspecified: Secondary | ICD-10-CM

## 2024-02-04 DIAGNOSIS — E1140 Type 2 diabetes mellitus with diabetic neuropathy, unspecified: Secondary | ICD-10-CM | POA: Diagnosis present

## 2024-02-04 DIAGNOSIS — I480 Paroxysmal atrial fibrillation: Secondary | ICD-10-CM

## 2024-02-04 DIAGNOSIS — Z79899 Other long term (current) drug therapy: Secondary | ICD-10-CM

## 2024-02-04 DIAGNOSIS — K7010 Alcoholic hepatitis without ascites: Secondary | ICD-10-CM | POA: Diagnosis present

## 2024-02-04 DIAGNOSIS — F419 Anxiety disorder, unspecified: Secondary | ICD-10-CM | POA: Diagnosis present

## 2024-02-04 DIAGNOSIS — Z833 Family history of diabetes mellitus: Secondary | ICD-10-CM

## 2024-02-04 DIAGNOSIS — Z7689 Persons encountering health services in other specified circumstances: Secondary | ICD-10-CM | POA: Diagnosis not present

## 2024-02-04 HISTORY — DX: Unspecified atrial fibrillation: I48.91

## 2024-02-04 HISTORY — PX: HARDWARE REMOVAL: SHX979

## 2024-02-04 LAB — GLUCOSE, CAPILLARY
Glucose-Capillary: 74 mg/dL (ref 70–99)
Glucose-Capillary: 94 mg/dL (ref 70–99)
Glucose-Capillary: 68 mg/dL — ABNORMAL LOW (ref 70–99)
Glucose-Capillary: 244 mg/dL — ABNORMAL HIGH (ref 70–99)
Glucose-Capillary: 223 mg/dL — ABNORMAL HIGH (ref 70–99)

## 2024-02-04 LAB — CBC
HCT: 32.1 % — ABNORMAL LOW (ref 39.0–52.0)
Hemoglobin: 9.9 g/dL — ABNORMAL LOW (ref 13.0–17.0)
MCH: 23.3 pg — ABNORMAL LOW (ref 26.0–34.0)
MCHC: 30.8 g/dL (ref 30.0–36.0)
MCV: 75.5 fL — ABNORMAL LOW (ref 80.0–100.0)
Platelets: 255 10*3/uL (ref 150–400)
RBC: 4.25 MIL/uL (ref 4.22–5.81)
RDW: 24.4 % — ABNORMAL HIGH (ref 11.5–15.5)
WBC: 9.8 10*3/uL (ref 4.0–10.5)
nRBC: 0 % (ref 0.0–0.2)

## 2024-02-04 LAB — SURGICAL PCR SCREEN
MRSA, PCR: NEGATIVE
Staphylococcus aureus: NEGATIVE

## 2024-02-04 LAB — TYPE AND SCREEN
ABO/RH(D): O POS
Antibody Screen: NEGATIVE

## 2024-02-04 SURGERY — REMOVAL, HARDWARE
Anesthesia: General | Site: Ankle | Laterality: Right

## 2024-02-04 MED ORDER — CHLORHEXIDINE GLUCONATE 0.12 % MT SOLN
15.0000 mL | Freq: Once | OROMUCOSAL | Status: AC
  Administered 2024-02-04: 15 mL via OROMUCOSAL
  Filled 2024-02-04: qty 15

## 2024-02-04 MED ORDER — INSULIN ASPART 100 UNIT/ML IJ SOLN
0.0000 [IU] | Freq: Three times a day (TID) | INTRAMUSCULAR | Status: DC
  Administered 2024-02-05: 11 [IU] via SUBCUTANEOUS
  Administered 2024-02-05: 5 [IU] via SUBCUTANEOUS
  Administered 2024-02-05: 8 [IU] via SUBCUTANEOUS
  Administered 2024-02-06: 3 [IU] via SUBCUTANEOUS

## 2024-02-04 MED ORDER — ACETAMINOPHEN 325 MG PO TABS: 325.0000 mg | ORAL_TABLET | Freq: Four times a day (QID) | ORAL | Status: DC | PRN

## 2024-02-04 MED ORDER — LACTATED RINGERS IV SOLN: INTRAVENOUS | Status: DC

## 2024-02-04 MED ORDER — CEFAZOLIN SODIUM-DEXTROSE 2-4 GM/100ML-% IV SOLN
2.0000 g | Freq: Once | INTRAVENOUS | Status: AC
  Administered 2024-02-04: 2 g via INTRAVENOUS
  Filled 2024-02-04: qty 100

## 2024-02-04 MED ORDER — SODIUM CHLORIDE 0.9 % IV SOLN: INTRAVENOUS | Status: DC

## 2024-02-04 MED ORDER — METHOCARBAMOL 1000 MG/10ML IJ SOLN: 500.0000 mg | Freq: Four times a day (QID) | INTRAMUSCULAR | Status: DC | PRN

## 2024-02-04 MED ORDER — PANTOPRAZOLE SODIUM 40 MG PO TBEC
40.0000 mg | DELAYED_RELEASE_TABLET | Freq: Two times a day (BID) | ORAL | Status: DC
  Administered 2024-02-04 – 2024-02-06 (×4): 40 mg via ORAL
  Filled 2024-02-04 (×4): qty 1

## 2024-02-04 MED ORDER — METOPROLOL SUCCINATE ER 25 MG PO TB24
ORAL_TABLET | ORAL | Status: AC
  Filled 2024-02-04: qty 1

## 2024-02-04 MED ORDER — BISACODYL 10 MG RE SUPP: 10.0000 mg | Freq: Every day | RECTAL | Status: DC | PRN

## 2024-02-04 MED ORDER — VANCOMYCIN HCL 500 MG IV SOLR
INTRAVENOUS | Status: DC | PRN
  Administered 2024-02-04: 1000 mg

## 2024-02-04 MED ORDER — OXYCODONE HCL 5 MG PO TABS
5.0000 mg | ORAL_TABLET | ORAL | Status: DC | PRN
  Administered 2024-02-05: 10 mg via ORAL
  Filled 2024-02-04 (×2): qty 2

## 2024-02-04 MED ORDER — DOCUSATE SODIUM 100 MG PO CAPS
100.0000 mg | ORAL_CAPSULE | Freq: Two times a day (BID) | ORAL | Status: DC
  Administered 2024-02-04 – 2024-02-06 (×4): 100 mg via ORAL
  Filled 2024-02-04 (×4): qty 1

## 2024-02-04 MED ORDER — ONDANSETRON HCL 4 MG/2ML IJ SOLN: 4.0000 mg | Freq: Once | INTRAMUSCULAR | Status: DC | PRN

## 2024-02-04 MED ORDER — METOPROLOL SUCCINATE ER 25 MG PO TB24
25.0000 mg | ORAL_TABLET | Freq: Every day | ORAL | Status: DC
  Administered 2024-02-04: 25 mg via ORAL

## 2024-02-04 MED ORDER — VANCOMYCIN HCL 1250 MG/250ML IV SOLN
1250.0000 mg | Freq: Once | INTRAVENOUS | Status: AC
  Administered 2024-02-04: 1250 mg via INTRAVENOUS
  Filled 2024-02-04: qty 250

## 2024-02-04 MED ORDER — ONDANSETRON HCL 4 MG/2ML IJ SOLN: 4.0000 mg | Freq: Four times a day (QID) | INTRAMUSCULAR | Status: DC | PRN

## 2024-02-04 MED ORDER — PROPOFOL 10 MG/ML IV BOLUS
INTRAVENOUS | Status: DC | PRN
  Administered 2024-02-04: 200 mg via INTRAVENOUS

## 2024-02-04 MED ORDER — INSULIN ASPART 100 UNIT/ML IJ SOLN
4.0000 [IU] | Freq: Three times a day (TID) | INTRAMUSCULAR | Status: DC
  Administered 2024-02-04 – 2024-02-06 (×3): 4 [IU] via SUBCUTANEOUS

## 2024-02-04 MED ORDER — ORAL CARE MOUTH RINSE: 15.0000 mL | Freq: Once | OROMUCOSAL | Status: AC

## 2024-02-04 MED ORDER — DEXAMETHASONE SODIUM PHOSPHATE 10 MG/ML IJ SOLN
INTRAMUSCULAR | Status: DC | PRN
  Administered 2024-02-04: 4 mg via INTRAVENOUS

## 2024-02-04 MED ORDER — MIDAZOLAM HCL 2 MG/2ML IJ SOLN
INTRAMUSCULAR | Status: DC | PRN
  Administered 2024-02-04: 2 mg via INTRAVENOUS

## 2024-02-04 MED ORDER — VANCOMYCIN HCL IN DEXTROSE 1-5 GM/200ML-% IV SOLN
1000.0000 mg | Freq: Two times a day (BID) | INTRAVENOUS | Status: DC
  Administered 2024-02-05 – 2024-02-06 (×3): 1000 mg via INTRAVENOUS
  Filled 2024-02-04 (×3): qty 200

## 2024-02-04 MED ORDER — OXYCODONE HCL 5 MG/5ML PO SOLN: 5.0000 mg | Freq: Once | ORAL | Status: DC | PRN

## 2024-02-04 MED ORDER — POLYETHYLENE GLYCOL 3350 17 G PO PACK: 17.0000 g | PACK | Freq: Every day | ORAL | Status: DC | PRN

## 2024-02-04 MED ORDER — OXYCODONE HCL 5 MG PO TABS
10.0000 mg | ORAL_TABLET | ORAL | Status: DC | PRN
  Administered 2024-02-04: 10 mg via ORAL
  Administered 2024-02-05: 15 mg via ORAL
  Filled 2024-02-04: qty 3

## 2024-02-04 MED ORDER — OXYCODONE HCL 5 MG PO TABS: 5.0000 mg | ORAL_TABLET | Freq: Once | ORAL | Status: DC | PRN

## 2024-02-04 MED ORDER — LIDOCAINE 2% (20 MG/ML) 5 ML SYRINGE
INTRAMUSCULAR | Status: DC | PRN
  Administered 2024-02-04: 100 mg via INTRAVENOUS

## 2024-02-04 MED ORDER — METOCLOPRAMIDE HCL 5 MG PO TABS: 5.0000 mg | ORAL_TABLET | Freq: Three times a day (TID) | ORAL | Status: DC | PRN

## 2024-02-04 MED ORDER — METHOCARBAMOL 500 MG PO TABS
500.0000 mg | ORAL_TABLET | Freq: Four times a day (QID) | ORAL | Status: DC | PRN
  Administered 2024-02-04 – 2024-02-05 (×3): 500 mg via ORAL
  Filled 2024-02-04 (×3): qty 1

## 2024-02-04 MED ORDER — METOCLOPRAMIDE HCL 5 MG/ML IJ SOLN: 5.0000 mg | Freq: Three times a day (TID) | INTRAMUSCULAR | Status: DC | PRN

## 2024-02-04 MED ORDER — FENTANYL CITRATE (PF) 250 MCG/5ML IJ SOLN
INTRAMUSCULAR | Status: AC
  Filled 2024-02-04: qty 5

## 2024-02-04 MED ORDER — INSULIN ASPART 100 UNIT/ML IJ SOLN: 0.0000 [IU] | INTRAMUSCULAR | Status: DC | PRN

## 2024-02-04 MED ORDER — EMPAGLIFLOZIN 10 MG PO TABS
10.0000 mg | ORAL_TABLET | Freq: Every day | ORAL | Status: DC
  Administered 2024-02-05 – 2024-02-06 (×2): 10 mg via ORAL
  Filled 2024-02-04 (×2): qty 1

## 2024-02-04 MED ORDER — 0.9 % SODIUM CHLORIDE (POUR BTL) OPTIME
TOPICAL | Status: DC | PRN
  Administered 2024-02-04: 1000 mL

## 2024-02-04 MED ORDER — FENTANYL CITRATE (PF) 250 MCG/5ML IJ SOLN
INTRAMUSCULAR | Status: DC | PRN
  Administered 2024-02-04 (×2): 50 ug via INTRAVENOUS

## 2024-02-04 MED ORDER — MAGNESIUM CITRATE PO SOLN
1.0000 | Freq: Once | ORAL | Status: DC | PRN
Start: 2024-02-04 — End: 2024-02-06

## 2024-02-04 MED ORDER — ATORVASTATIN CALCIUM 10 MG PO TABS
20.0000 mg | ORAL_TABLET | Freq: Every day | ORAL | Status: DC
  Administered 2024-02-04 – 2024-02-06 (×3): 20 mg via ORAL
  Filled 2024-02-04 (×3): qty 2

## 2024-02-04 MED ORDER — METOPROLOL SUCCINATE ER 25 MG PO TB24
12.5000 mg | ORAL_TABLET | Freq: Every day | ORAL | Status: DC
  Administered 2024-02-05: 12.5 mg via ORAL
  Filled 2024-02-04 (×2): qty 1

## 2024-02-04 MED ORDER — PROPOFOL 10 MG/ML IV BOLUS
INTRAVENOUS | Status: AC
  Filled 2024-02-04: qty 20

## 2024-02-04 MED ORDER — LACTATED RINGERS IV SOLN: INTRAVENOUS | Status: DC | PRN

## 2024-02-04 MED ORDER — ONDANSETRON HCL 4 MG PO TABS: 4.0000 mg | ORAL_TABLET | Freq: Four times a day (QID) | ORAL | Status: DC | PRN

## 2024-02-04 MED ORDER — VASHE WOUND IRRIGATION OPTIME
TOPICAL | Status: DC | PRN
  Administered 2024-02-04: 34 [oz_av]

## 2024-02-04 MED ORDER — DULOXETINE HCL 60 MG PO CPEP
60.0000 mg | ORAL_CAPSULE | Freq: Every day | ORAL | Status: DC
  Administered 2024-02-05 – 2024-02-06 (×2): 60 mg via ORAL
  Filled 2024-02-04 (×2): qty 1

## 2024-02-04 MED ORDER — HYDROMORPHONE HCL 1 MG/ML IJ SOLN: 0.5000 mg | INTRAMUSCULAR | Status: DC | PRN

## 2024-02-04 MED ORDER — FENTANYL CITRATE (PF) 100 MCG/2ML IJ SOLN: 25.0000 ug | INTRAMUSCULAR | Status: DC | PRN

## 2024-02-04 MED ORDER — ACETAMINOPHEN 10 MG/ML IV SOLN
500.0000 mg | Freq: Once | INTRAVENOUS | Status: DC | PRN
Start: 2024-02-04 — End: 2024-02-04

## 2024-02-04 MED ORDER — METFORMIN HCL 500 MG PO TABS
1000.0000 mg | ORAL_TABLET | Freq: Two times a day (BID) | ORAL | Status: DC
  Administered 2024-02-04 – 2024-02-06 (×4): 1000 mg via ORAL
  Filled 2024-02-04 (×4): qty 2

## 2024-02-04 MED ORDER — ONDANSETRON HCL 4 MG/2ML IJ SOLN
INTRAMUSCULAR | Status: DC | PRN
  Administered 2024-02-04: 4 mg via INTRAVENOUS

## 2024-02-04 MED ORDER — INSULIN GLARGINE-YFGN 100 UNIT/ML ~~LOC~~ SOLN
20.0000 [IU] | Freq: Every day | SUBCUTANEOUS | Status: DC
  Administered 2024-02-05: 20 [IU] via SUBCUTANEOUS
  Filled 2024-02-04 (×2): qty 0.2

## 2024-02-04 MED ORDER — VANCOMYCIN HCL 1000 MG IV SOLR
INTRAVENOUS | Status: AC
  Filled 2024-02-04: qty 20

## 2024-02-04 MED ORDER — APIXABAN 2.5 MG PO TABS
2.5000 mg | ORAL_TABLET | Freq: Two times a day (BID) | ORAL | Status: DC
  Administered 2024-02-04 – 2024-02-06 (×4): 2.5 mg via ORAL
  Filled 2024-02-04 (×4): qty 1

## 2024-02-04 MED ORDER — MIDAZOLAM HCL 2 MG/2ML IJ SOLN
INTRAMUSCULAR | Status: AC
  Filled 2024-02-04: qty 2

## 2024-02-04 MED ORDER — LEVETIRACETAM 500 MG PO TABS
500.0000 mg | ORAL_TABLET | Freq: Two times a day (BID) | ORAL | Status: DC
  Administered 2024-02-04 – 2024-02-06 (×4): 500 mg via ORAL
  Filled 2024-02-04 (×4): qty 1

## 2024-02-04 MED ORDER — PHENYLEPHRINE 80 MCG/ML (10ML) SYRINGE FOR IV PUSH (FOR BLOOD PRESSURE SUPPORT)
PREFILLED_SYRINGE | INTRAVENOUS | Status: DC | PRN
  Administered 2024-02-04: 160 ug via INTRAVENOUS

## 2024-02-04 SURGICAL SUPPLY — 38 items
BAG COUNTER SPONGE SURGICOUNT (BAG) ×2 IMPLANT
BANDAGE ESMARK 6X9 LF (GAUZE/BANDAGES/DRESSINGS) IMPLANT
BNDG COHESIVE 4X5 TAN STRL LF (GAUZE/BANDAGES/DRESSINGS) IMPLANT
BNDG GAUZE DERMACEA FLUFF 4 (GAUZE/BANDAGES/DRESSINGS) ×2 IMPLANT
CLEANSER WND VASHE 34 (WOUND CARE) IMPLANT
COVER SURGICAL LIGHT HANDLE (MISCELLANEOUS) ×4 IMPLANT
CUFF TOURN SGL QUICK 42 (TOURNIQUET CUFF) IMPLANT
CUFF TRNQT CYL 34X4.125X (TOURNIQUET CUFF) IMPLANT
DRAPE C-ARM 42X72 X-RAY (DRAPES) IMPLANT
DRAPE INCISE IOBAN 66X45 STRL (DRAPES) IMPLANT
DRAPE SURG ORHT 6 SPLT 77X108 (DRAPES) IMPLANT
DRAPE U-SHAPE 47X51 STRL (DRAPES) ×2 IMPLANT
DRSG EMULSION OIL 3X3 NADH (GAUZE/BANDAGES/DRESSINGS) ×2 IMPLANT
DRSG VAC PEEL AND PLACE LRG (GAUZE/BANDAGES/DRESSINGS) IMPLANT
DURAPREP 26ML APPLICATOR (WOUND CARE) ×2 IMPLANT
ELECTRODE REM PT RTRN 9FT ADLT (ELECTROSURGICAL) ×2 IMPLANT
GAUZE PAD ABD 8X10 STRL (GAUZE/BANDAGES/DRESSINGS) ×2 IMPLANT
GAUZE SPONGE 4X4 12PLY STRL (GAUZE/BANDAGES/DRESSINGS) ×2 IMPLANT
GLOVE BIOGEL PI IND STRL 9 (GLOVE) ×2 IMPLANT
GLOVE SURG ORTHO 9.0 STRL STRW (GLOVE) ×2 IMPLANT
GOWN STRL REUS W/ TWL XL LVL3 (GOWN DISPOSABLE) ×6 IMPLANT
GRAFT SKIN WND MICRO 38 (Tissue) IMPLANT
KIT BASIN OR (CUSTOM PROCEDURE TRAY) ×2 IMPLANT
KIT TURNOVER KIT B (KITS) ×2 IMPLANT
MANIFOLD NEPTUNE II (INSTRUMENTS) ×2 IMPLANT
NS IRRIG 1000ML POUR BTL (IV SOLUTION) ×2 IMPLANT
PACK ORTHO EXTREMITY (CUSTOM PROCEDURE TRAY) ×2 IMPLANT
PAD ARMBOARD POSITIONER FOAM (MISCELLANEOUS) ×4 IMPLANT
SPONGE T-LAP 18X18 ~~LOC~~+RFID (SPONGE) IMPLANT
STAPLER SKIN PROX 35W (STAPLE) IMPLANT
STOCKINETTE IMPERVIOUS 9X36 MD (GAUZE/BANDAGES/DRESSINGS) IMPLANT
SUT ETHILON 2 0 PSLX (SUTURE) IMPLANT
SUT VIC AB 0 CT1 27XBRD ANBCTR (SUTURE) IMPLANT
SUT VIC AB 2-0 CT1 TAPERPNT 27 (SUTURE) IMPLANT
TOWEL GREEN STERILE (TOWEL DISPOSABLE) ×2 IMPLANT
TOWEL GREEN STERILE FF (TOWEL DISPOSABLE) ×2 IMPLANT
UNDERPAD 30X36 HEAVY ABSORB (UNDERPADS AND DIAPERS) ×2 IMPLANT
WATER STERILE IRR 1000ML POUR (IV SOLUTION) ×2 IMPLANT

## 2024-02-04 NOTE — Progress Notes (Signed)
 Pharmacy Antibiotic Note  Gary Mitchell is a 53 y.o. male admitted on 02/04/2024 with osteomyelitis s/p removal of ankle hardware.  Pharmacy has been consulted for Vancomycin  dosing.   Plan: Vancomycin  1250 mg iv x 1 dose now then 1000 mg iv Q 12 hours (AUC of 503 using Scr 0.8)  Follow Scr and LOT  Height: 5' 6 (167.6 cm) Weight: 64.4 kg (142 lb) IBW/kg (Calculated) : 63.8  Temp (24hrs), Avg:97.7 F (36.5 C), Min:97.2 F (36.2 C), Max:98.8 F (37.1 C)  Recent Labs  Lab 02/04/24 1305  WBC 9.8    CrCl cannot be calculated (This lab value cannot be used to calculate CrCl because it is not a number: <0.30).    Allergies  Allergen Reactions   Nsaids Other (See Comments)    Patient said he was told he could take Tylenol  ONLY.   Iohexol  Other (See Comments)    Unknown reaction at 23 days old Mom at bedside reported patient was given injections of iohexol  in foot and resulted in hole in foot or foot got infected     Thank you. Olam Monte, PharmD  02/04/2024 4:25 PM

## 2024-02-04 NOTE — Transfer of Care (Signed)
 Immediate Anesthesia Transfer of Care Note  Patient: Gary Mitchell  Procedure(s) Performed: REMOVAL, HARDWARE (Right: Ankle)  Patient Location: PACU  Anesthesia Type:General  Level of Consciousness: awake, alert , and oriented  Airway & Oxygen Therapy: Patient Spontanous Breathing and Patient connected to nasal cannula oxygen  Post-op Assessment: Report given to RN and Post -op Vital signs reviewed and stable  Post vital signs: Reviewed and stable  Last Vitals:  Vitals Value Taken Time  BP 116/83 02/04/24 14:38  Temp 36.3 C 02/04/24 14:37  Pulse 78 02/04/24 14:43  Resp 14 02/04/24 14:43  SpO2 98 % 02/04/24 14:43  Vitals shown include unfiled device data.  Last Pain:  Vitals:   02/04/24 1312  TempSrc: Oral  PainSc: 0-No pain         Complications: No notable events documented.

## 2024-02-04 NOTE — Anesthesia Procedure Notes (Signed)
 Procedure Name: LMA Insertion Date/Time: 02/04/2024 2:02 PM  Performed by: Zhania Shaheen, CRNAPre-anesthesia Checklist: Patient identified, Emergency Drugs available, Suction available and Patient being monitored Patient Re-evaluated:Patient Re-evaluated prior to induction Oxygen Delivery Method: Circle System Utilized Preoxygenation: Pre-oxygenation with 100% oxygen Induction Type: IV induction Ventilation: Mask ventilation without difficulty LMA: LMA inserted LMA Size: 4.0 Number of attempts: 1 Airway Equipment and Method: Bite block Placement Confirmation: positive ETCO2 Tube secured with: Tape Dental Injury: Teeth and Oropharynx as per pre-operative assessment  Comments: [email or web address]

## 2024-02-04 NOTE — H&P (Signed)
 Gary Mitchell is an 53 y.o. male.   Chief Complaint: Open wound lateral right ankle. HPI: Patient is a 53 year old gentleman who was seen for initial evaluation for infected hardware right ankle. Patient is status post open reduction internal fixation for trimalleolar right ankle fracture on first. Patient is currently ambulating in a cam boot with crutches. Patient states she has had odor and drainage she is not on antibiotics. Patient states he would not be able to afford antibiotics if they were called in.   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   Seizures (HCC)    Seizures (HCC)    Thrombocytopenia (HCC)     Past Surgical History:  Procedure Laterality Date   BIOPSY  08/02/2021   Procedure: BIOPSY;  Surgeon: Teressa Toribio SQUIBB, MD;  Location: [facility];  Service: Endoscopy;;   BIOPSY  05/07/2022   Procedure: BIOPSY;  Surgeon: Stacia Glendia BRAVO, MD;  Location: [facility];  Service: Gastroenterology;;   COLONOSCOPY WITH PROPOFOL  N/A 05/08/2022   Procedure: COLONOSCOPY WITH PROPOFOL ;  Surgeon: Stacia Glendia BRAVO, MD;  Location: [facility];  Service: Gastroenterology;  Laterality: N/A;   ENTEROSCOPY N/A 09/20/2020   Procedure: ENTEROSCOPY;  Surgeon: Eda Iha, MD;  Location: [facility];  Service: Gastroenterology;  Laterality: N/A;   ENTEROSCOPY N/A 05/11/2022   Procedure:  ENTEROSCOPY;  Surgeon: Stacia Glendia BRAVO, MD;  Location: [facility];  Service: Gastroenterology;  Laterality: N/A;   ESOPHAGOGASTRODUODENOSCOPY (EGD) WITH PROPOFOL  N/A 08/02/2021   Procedure: ESOPHAGOGASTRODUODENOSCOPY (EGD) WITH PROPOFOL ;  Surgeon: Teressa Toribio SQUIBB, MD;  Location: [facility];  Service: Endoscopy;  Laterality: N/A;   ESOPHAGOGASTRODUODENOSCOPY (EGD) WITH PROPOFOL  N/A 01/31/2022   Procedure: ESOPHAGOGASTRODUODENOSCOPY (EGD) WITH PROPOFOL ;  Surgeon: Teressa Toribio SQUIBB, MD;  Location: [facility];  Service: Gastroenterology;  Laterality: N/A;   ESOPHAGOGASTRODUODENOSCOPY (EGD) WITH PROPOFOL  N/A 05/07/2022   Procedure: ESOPHAGOGASTRODUODENOSCOPY (EGD) WITH PROPOFOL ;  Surgeon: Stacia Glendia BRAVO, MD;  Location: [facility];  Service: Gastroenterology;  Laterality: N/A;   ESOPHAGOGASTRODUODENOSCOPY (EGD) WITH PROPOFOL  N/A 06/13/2022   Procedure: ESOPHAGOGASTRODUODENOSCOPY (EGD) WITH PROPOFOL ;  Surgeon: Wilhelmenia Aloha Raddle., MD;  Location: [facility];  Service: Gastroenterology;  Laterality: N/A;   EUS N/A 01/31/2022   Procedure: UPPER ENDOSCOPIC ULTRASOUND (EUS) RADIAL;  Surgeon: Teressa Toribio SQUIBB, MD;  Location: [facility];  Service: Gastroenterology;  Laterality: N/A;   EUS N/A 06/13/2022   Procedure: UPPER ENDOSCOPIC ULTRASOUND (EUS) LINEAR;  Surgeon: Wilhelmenia Aloha Raddle., MD;  Location: [facility];  Service: Gastroenterology;  Laterality: N/A;   FINE NEEDLE ASPIRATION N/A 08/02/2021   Procedure: FINE NEEDLE ASPIRATION (FNA) LINEAR;  Surgeon: Teressa Toribio SQUIBB, MD;  Location: [facility];  Service: Endoscopy;  Laterality: N/A;   FINE NEEDLE ASPIRATION N/A 01/31/2022   Procedure: FINE NEEDLE ASPIRATION (FNA) LINEAR;  Surgeon: Teressa Toribio SQUIBB, MD;  Location: [facility];  Service: Gastroenterology;  Laterality: N/A;   FINE NEEDLE ASPIRATION N/A 06/13/2022   Procedure: FINE NEEDLE ASPIRATION (FNA) LINEAR;  Surgeon: Wilhelmenia Aloha Raddle., MD;  Location: [facility];   Service: Gastroenterology;  Laterality: N/A;   GIVENS CAPSULE STUDY N/A 05/09/2022   Procedure: GIVENS CAPSULE STUDY;  Surgeon: Stacia Glendia BRAVO, MD;  Location: [facility];  Service: Gastroenterology;  Laterality: N/A;   HEMOSTASIS CLIP PLACEMENT  05/08/2022   Procedure: HEMOSTASIS CLIP PLACEMENT;  Surgeon: Stacia Glendia BRAVO, MD;  Location: [facility];  Service: Gastroenterology;;   HEMOSTASIS CLIP PLACEMENT  05/11/2022   Procedure: HEMOSTASIS CLIP PLACEMENT;  Surgeon: Stacia Glendia BRAVO, MD;  Location: [facility];  Service: Gastroenterology;;   HOT HEMOSTASIS N/A 06/13/2022   Procedure: HOT HEMOSTASIS (ARGON PLASMA COAGULATION/BICAP);  Surgeon: Wilhelmenia Aloha Raddle., MD;  Location: [facility];  Service: Gastroenterology;  Laterality: N/A;   left kidney removed     ORIF ANKLE FRACTURE Right 01/06/2024   Procedure: OPEN REDUCTION INTERNAL FIXATION (ORIF) ANKLE FRACTURE;  Surgeon: Beverley Evalene BIRCH, MD;  Location: [facility];  Service: Orthopedics;  Laterality: Right;   POLYPECTOMY  05/08/2022   Procedure: POLYPECTOMY;  Surgeon: Stacia Glendia BRAVO, MD;  Location: [facility];  Service: Gastroenterology;;   UPPER ESOPHAGEAL ENDOSCOPIC ULTRASOUND (EUS) N/A 08/02/2021   Procedure: UPPER ESOPHAGEAL ENDOSCOPIC ULTRASOUND (EUS);  Surgeon: Teressa Toribio SQUIBB, MD;  Location: [facility];  Service: Endoscopy;  Laterality: N/A;  Radial and Linear    Family History  Problem Relation Age of Onset   Diabetes Mellitus II Mother    Colon cancer Neg Hx    Esophageal cancer Neg Hx    Inflammatory bowel disease Neg Hx    Liver disease Neg Hx    Pancreatic cancer Neg Hx    Rectal cancer Neg Hx    Stomach cancer Neg Hx    Social History:  reports that he quit smoking about 6 years ago. His smoking use included cigarettes. He started smoking about 31 years ago. He has a 2.5 pack-year smoking history. He has been exposed to tobacco smoke. He has never used smokeless tobacco. He reports that he does not  currently use alcohol. He reports that he does not currently use drugs.  Allergies:  Allergies  Allergen Reactions   Nsaids Other (See Comments)    Patient said he was told he could take Tylenol  ONLY.   Iohexol  Other (See Comments)    Unknown reaction at 52 days old Mom at bedside reported patient was given injections of iohexol  in foot and resulted in hole in foot or foot got infected    No medications prior to admission.    No results found for this or any previous visit (from the past 48 hours). No results found.  Review of Systems  All other systems reviewed and are negative.   Height 5' 6 (1.676 m), weight 64.4 kg. Physical Exam  Patient is alert, oriented, no adenopathy, well-dressed, normal affect, normal respiratory effort. Examination patient has a strong palpable dorsalis pedis pulse his foot is plantigrade no angular deformity.  Patient has dehiscence of the surgical incision over the lateral ankle.  Using a Q-tip this ulcer probes down to the hardware for the length of the incision.  There is no purulent drainage.  With palpation the incision is tender but I cannot express any fluid.  Most recent hemoglobin A1c 7.1.  Patient states that sugars generally run between 150 and 200. Assessment/Plan 1. Infected hardware in right lower extremity, initial encounter (HCC)  Plan: Will plan for surgical intervention on Wednesday.  Discussed that we would remove the deep retained hardware as well as the FiberWire suture.  Risk and benefits were discussed including persistent infection and need for additional surgery.  Patient states she understands wished to proceed at this time  Jerona LULLA Sage, MD 02/04/2024, 6:44 AM

## 2024-02-04 NOTE — Plan of Care (Signed)
   Problem: Education: Goal: Knowledge of General Education information will improve Description Including pain rating scale, medication(s)/side effects and non-pharmacologic comfort measures Outcome: Progressing   Problem: Health Behavior/Discharge Planning: Goal: Ability to manage health-related needs will improve Outcome: Progressing

## 2024-02-04 NOTE — Progress Notes (Signed)
 Patient arrived to room. Alert and oriented x4. Call bell within reach and bed alarm on. VSS.

## 2024-02-04 NOTE — Interval H&P Note (Signed)
 History and Physical Interval Note:  02/04/2024 1:20 PM  Gary Mitchell  has presented today for surgery, with the diagnosis of infected hardware right ankle.  The various methods of treatment have been discussed with the patient and family. After consideration of risks, benefits and other options for treatment, the patient has consented to  Procedure(s) with comments: REMOVAL, HARDWARE (Right) - HARDWARE REMOVAL RIGHT ANKLE as a surgical intervention.  The patient's history has been reviewed, patient examined, no change in status, stable for surgery.  I have reviewed the patient's chart and labs.  Questions were answered to the patient's satisfaction.     Jesika Men V Corday Wyka

## 2024-02-04 NOTE — Op Note (Signed)
 02/04/2024  2:48 PM  PATIENT:  Gary Mitchell    PRE-OPERATIVE DIAGNOSIS:  infected hardware right ankle  POST-OPERATIVE DIAGNOSIS:  Same  PROCEDURE:  REMOVAL DEEP HARDWARE Skin soft tissue fascia and bone sent for cultures. Application Kerecis micro graft 38 cm and 1 g vancomycin  powder.  Local tissue transfer for wound closure 10 x 4 cm. Application of cleanse choice wound VAC sponge.  SURGEON:  Jerona LULLA Sage, MD  PHYSICIAN ASSISTANT:None ANESTHESIA:   General  PREOPERATIVE INDICATIONS:  Gary Mitchell is a  53 y.o. male with a diagnosis of infected hardware right ankle who failed conservative measures and elected for surgical management.    The risks benefits and alternatives were discussed with the patient preoperatively including but not limited to the risks of infection, bleeding, nerve injury, cardiopulmonary complications, the need for revision surgery, among others, and the patient was willing to proceed.  OPERATIVE IMPLANTS:   Implant Name Type Inv. Item Serial No. Manufacturer Lot No. LRB No. Used Action  PLATE FIBULA 5H - [id number] Plate PLATE FIBULA 5H  [employer]  Right 1 Explanted  SCREW BN T10 FT 14X3.5XSTRDR - [id number] Screw SCREW BN T10 FT 14X3.5XSTRDR  [employer]  Right 3 Explanted  SCREW BONE THRD T10 3.5X12 - [id number] Screw SCREW BONE THRD T10 3.5X12  [employer]  Right 4 Explanted  SYNDESMOSIS TIGHTROPE XP - [id number] Orthopedic Implant SYNDESMOSIS TIGHTROPE XP  [employer] [id number] Right 1 Explanted  GRAFT SKIN WND MICRO 38 - [id number] Tissue GRAFT SKIN WND MICRO 38  [employer] [id number] Right 1 Implanted    @ENCIMAGES @  OPERATIVE FINDINGS: Nonviable tissue was sent for cultures including bone scrapings.  OPERATIVE PROCEDURE: Patient was brought to the operating room and underwent a general anesthetic.  After adequate levels anesthesia were obtained patient's right lower extremity was prepped using DuraPrep draped  into a sterile field a timeout was called.  Elliptical incision was made around the previous incision this left a the wound that was 10 x 4 cm.  The plate and screws were removed.  The FiberWire tight rope was removed as well.  A periosteal elevator was used to debride the bone and bone was sent for cultures.  Nonviable tissue was sent for cultures.  After excisional debridement the wound was irrigated with Vashe.  The wound bed was filled with 38 cm of Kerecis micro graft and 1 g vancomycin  powder.  Tissue margins were undermined and local tissue transfer was used to close the wound 10 x 4 cm.  A Prevena peel in place dressing was applied this had a good suction fit patient was extubated taken the PACU in stable condition.   DISCHARGE PLANNING:  Antibiotic duration: Start IV vancomycin .  Previous PCR test were positive for MRSA.  Weightbearing: Touchdown weightbearing on the right with the fracture boot in place  Pain medication: Opioid pathway  Dressing care/ Wound VAC: Wound VAC x 1  Ambulatory devices: Crutches or walker  Discharge to: Anticipate discharge to home.  Follow-up: In the office 1 week post operative.

## 2024-02-04 NOTE — Plan of Care (Signed)
  Problem: Education: Goal: Knowledge of General Education information will improve Description: Including pain rating scale, medication(s)/side effects and non-pharmacologic comfort measures 02/04/2024 1831 by Delores Kirsch, RN Outcome: Progressing 02/04/2024 1612 by Delores Kirsch, RN Outcome: Progressing   Problem: Health Behavior/Discharge Planning: Goal: Ability to manage health-related needs will improve 02/04/2024 1831 by Delores Kirsch, RN Outcome: Progressing 02/04/2024 1612 by Delores Kirsch, RN Outcome: Progressing

## 2024-02-05 ENCOUNTER — Encounter (HOSPITAL_COMMUNITY): Payer: Self-pay | Admitting: Orthopedic Surgery

## 2024-02-05 DIAGNOSIS — K7010 Alcoholic hepatitis without ascites: Secondary | ICD-10-CM | POA: Diagnosis not present

## 2024-02-05 DIAGNOSIS — Y831 Surgical operation with implant of artificial internal device as the cause of abnormal reaction of the patient, or of later complication, without mention of misadventure at the time of the procedure: Secondary | ICD-10-CM | POA: Diagnosis present

## 2024-02-05 DIAGNOSIS — Z833 Family history of diabetes mellitus: Secondary | ICD-10-CM | POA: Diagnosis not present

## 2024-02-05 DIAGNOSIS — Z87891 Personal history of nicotine dependence: Secondary | ICD-10-CM | POA: Diagnosis not present

## 2024-02-05 DIAGNOSIS — F419 Anxiety disorder, unspecified: Secondary | ICD-10-CM | POA: Diagnosis not present

## 2024-02-05 DIAGNOSIS — I480 Paroxysmal atrial fibrillation: Secondary | ICD-10-CM | POA: Diagnosis not present

## 2024-02-05 DIAGNOSIS — Z91148 Patient's other noncompliance with medication regimen for other reason: Secondary | ICD-10-CM | POA: Diagnosis not present

## 2024-02-05 DIAGNOSIS — R569 Unspecified convulsions: Secondary | ICD-10-CM | POA: Diagnosis not present

## 2024-02-05 DIAGNOSIS — K7030 Alcoholic cirrhosis of liver without ascites: Secondary | ICD-10-CM | POA: Diagnosis not present

## 2024-02-05 DIAGNOSIS — T847XXA Infection and inflammatory reaction due to other internal orthopedic prosthetic devices, implants and grafts, initial encounter: Secondary | ICD-10-CM | POA: Diagnosis not present

## 2024-02-05 DIAGNOSIS — E1140 Type 2 diabetes mellitus with diabetic neuropathy, unspecified: Secondary | ICD-10-CM | POA: Diagnosis not present

## 2024-02-05 DIAGNOSIS — Z79899 Other long term (current) drug therapy: Secondary | ICD-10-CM | POA: Diagnosis not present

## 2024-02-05 DIAGNOSIS — F32A Depression, unspecified: Secondary | ICD-10-CM | POA: Diagnosis not present

## 2024-02-05 DIAGNOSIS — K862 Cyst of pancreas: Secondary | ICD-10-CM | POA: Diagnosis not present

## 2024-02-05 LAB — BASIC METABOLIC PANEL WITH GFR
Anion gap: 12 (ref 5–15)
BUN: 7 mg/dL (ref 6–20)
CO2: 18 mmol/L — ABNORMAL LOW (ref 22–32)
Calcium: 9 mg/dL (ref 8.9–10.3)
Chloride: 100 mmol/L (ref 98–111)
Creatinine, Ser: 0.75 mg/dL (ref 0.61–1.24)
GFR, Estimated: >60 mL/min (ref >60)
Glucose, Bld: 281 mg/dL — ABNORMAL HIGH (ref 70–99)
Potassium: 4 mmol/L (ref 3.5–5.1)
Sodium: 130 mmol/L — ABNORMAL LOW (ref 135–145)

## 2024-02-05 LAB — GLUCOSE, CAPILLARY
Glucose-Capillary: 278 mg/dL — ABNORMAL HIGH (ref 70–99)
Glucose-Capillary: 244 mg/dL — ABNORMAL HIGH (ref 70–99)
Glucose-Capillary: 330 mg/dL — ABNORMAL HIGH (ref 70–99)
Glucose-Capillary: 161 mg/dL — ABNORMAL HIGH (ref 70–99)

## 2024-02-05 NOTE — Anesthesia Postprocedure Evaluation (Signed)
 Anesthesia Post Note  Patient: MIRL HILLERY  Procedure(s) Performed: REMOVAL, HARDWARE (Right: Ankle)     Patient location during evaluation: PACU Anesthesia Type: General Level of consciousness: awake and alert Pain management: pain level controlled Vital Signs Assessment: post-procedure vital signs reviewed and stable Respiratory status: spontaneous breathing, nonlabored ventilation, respiratory function stable and patient connected to nasal cannula oxygen Cardiovascular status: blood pressure returned to baseline and stable Postop Assessment: no apparent nausea or vomiting Anesthetic complications: no   There were no known notable events for this encounter.  Last Vitals:  Vitals:   02/05/24 0427 02/05/24 0800  BP: (!) 106/96 104/61  Pulse: 85 80  Resp: 17 16  Temp: 36.9 C 36.4 C  SpO2: 100% 97%    Last Pain:  Vitals:   02/05/24 0412  TempSrc:   PainSc: 7                  Rome Ade

## 2024-02-05 NOTE — Progress Notes (Signed)
 Hypoglycemic Event  CBG: 68  Treatment: 8 oz juice/soda  Symptoms: None  Follow-up CBG: Time:244 CBG Result:1800  Possible Reasons for Event: Other: surgery  Comments/MD notified:    Clotilda Daring

## 2024-02-05 NOTE — Progress Notes (Addendum)
 Patient ID: Gary Mitchell, male   DOB: [birth year], 53 y.o.   MRN: [id number] Patient is postoperative day 1 debridement of infected retained hardware right ankle.  Tissue was sent for cultures.  Patient states that his ankle feels a whole lot better.  There is 350 cc in the wound VAC canister.  Will discharge to home when drainage is less than 50 cc in a day.  Awaiting culture sensitivities.  Patient has the soft tissue reinforced with Kerecis micro graft and 1 g vancomycin  powder for topical antibiotic coverage
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
## 2024-02-06 ENCOUNTER — Other Ambulatory Visit (HOSPITAL_COMMUNITY): Payer: Self-pay

## 2024-02-06 LAB — GLUCOSE, CAPILLARY
Glucose-Capillary: 190 mg/dL — ABNORMAL HIGH (ref 70–99)
Glucose-Capillary: 178 mg/dL — ABNORMAL HIGH (ref 70–99)

## 2024-02-06 MED ORDER — OXYCODONE-ACETAMINOPHEN 5-325 MG PO TABS
1.0000 | ORAL_TABLET | ORAL | 0 refills | Status: DC | PRN
  Filled 2024-02-06 (×3): qty 30, 5d supply, fill #0

## 2024-02-06 MED ORDER — DOXYCYCLINE HYCLATE 100 MG PO TABS
100.0000 mg | ORAL_TABLET | Freq: Two times a day (BID) | ORAL | 0 refills | Status: DC
Start: 2024-02-06 — End: 2024-02-28
  Filled 2024-02-06 (×2): qty 30, 15d supply, fill #0

## 2024-02-06 NOTE — TOC Transition Note (Addendum)
 Transition of Care [facility]) - Discharge Note   Patient Details  Name: Gary Mitchell MRN: [id number] Date of Birth: 1970-10-18  Transition of Care [facility]) CM/SW Contact:  Rosalva Jon Bloch, RN Phone Number: 02/06/2024, 10:05 AM   Clinical Narrative:    Patient will DC to: home Anticipated DC date: 02/09/73 Family notified: yes Transport by: car  Infected hardware in right lower extremity     - s/p   removal of deep hardware, wound vac application 7/30 Per MD patient ready for DC today. RN, patient, and patient's family notified of DC. Per provider nurse to place  wound VAC dressing to the Prevena plus portable wound VAC pump at discharge. Referral made for RW with [employer]. Equipment will be delivered to bedside prior to d/c. Pt without RX med concerns. Pt to pick up RX meds from [facility] pharmacy prior to d/c. Post hospital f/u noted on AVS. Friend to provide transportation to home.  RNCM will sign off for now as intervention is no longer needed. Please consult us  again if new needs arise.      Barriers to Discharge: No Barriers Identified   Patient Goals and CMS Choice     Choice offered to / list presented to : Patient      Discharge Placement                       Discharge Plan and Services Additional resources added to the After Visit Summary for                    DME Agency: [employer] Date DME Agency Contacted: 02/06/24 Time DME Agency Contacted: 1005 Representative spoke with at DME Agency: Jermaine            Social Drivers of Health (SDOH) Interventions SDOH Screenings   Food Insecurity: Food Insecurity Present (02/04/2024)  Housing: High Risk (02/04/2024)  Transportation Needs: Unmet Transportation Needs (02/04/2024)  Utilities: At Risk (02/04/2024)  Alcohol Screen: Low Risk  (05/13/2023)  Depression (PHQ2-9): High Risk (11/17/2023)  Financial Resource Strain: High Risk (04/11/2023)  Physical Activity: Patient Declined (05/13/2023)   Social Connections: Socially Isolated (05/13/2023)  Stress: No Stress Concern Present (05/13/2023)  Tobacco Use: Medium Risk (02/03/2024)  Health Literacy: Adequate Health Literacy (05/13/2023)     Readmission Risk Interventions    01/06/2024    9:34 AM  Readmission Risk Prevention Plan  Transportation Screening Complete  PCP or Specialist Appt within 5-7 Days Complete  Home Care Screening Complete  Medication Review (RN CM) Complete

## 2024-02-06 NOTE — Plan of Care (Signed)
  Problem: Education: Goal: Knowledge of General Education information will improve Description: Including pain rating scale, medication(s)/side effects and non-pharmacologic comfort measures Outcome: Adequate for Discharge   Problem: Activity: Goal: Risk for activity intolerance will decrease Outcome: Adequate for Discharge   Problem: Pain Managment: Goal: General experience of comfort will improve and/or be controlled Outcome: Adequate for Discharge

## 2024-02-06 NOTE — Progress Notes (Signed)
 Discharge summary (AVS) provided to pt with instructions.. Pt verbalized understanding of instructions. Wound vac changed to prevena & educated. Pt to be transferred to d/c lounge, and pt is agreeable . NO complaints. RW to be delivered in the lounger& CM made aware, TOC pharm to provide his meds instead. DC lounge made aware of above & will provide taxi voucher to pt at d/c, Pt d/c to home as ordered.
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
## 2024-02-06 NOTE — Progress Notes (Signed)
 Patient ID: Gary Mitchell, male   DOB: 1970-08-22, 53 y.o.   MRN: [id number] Patient is status post removal of deep retained hardware debridement ankle.  There is 350 cc in the wound VAC canister no new drainage.  Will discharge patient today with the Prevena plus portable wound VAC pump and a prescription for doxycycline  and Percocet.  Will follow-up in the office in 1 week.
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
## 2024-02-08 ENCOUNTER — Other Ambulatory Visit: Payer: Self-pay | Admitting: Orthopedic Surgery

## 2024-02-08 MED ORDER — AMOXICILLIN-POT CLAVULANATE 875-125 MG PO TABS
1.0000 | ORAL_TABLET | Freq: Two times a day (BID) | ORAL | 0 refills | Status: DC
  Filled 2024-02-08: qty 20, 10d supply, fill #0

## 2024-02-09 ENCOUNTER — Telehealth: Payer: Self-pay | Admitting: *Deleted

## 2024-02-09 ENCOUNTER — Other Ambulatory Visit (HOSPITAL_COMMUNITY): Payer: Self-pay

## 2024-02-09 ENCOUNTER — Encounter (HOSPITAL_COMMUNITY): Payer: Self-pay | Admitting: Gastroenterology

## 2024-02-09 LAB — AEROBIC/ANAEROBIC CULTURE W GRAM STAIN (SURGICAL/DEEP WOUND): Gram Stain: NONE SEEN

## 2024-02-09 NOTE — Transitions of Care (Post Inpatient/ED Visit) (Signed)
   02/09/2024  Name: Gary Mitchell MRN: [id number] DOB: 04/11/1971  Today's TOC FU Call Status: Today's TOC FU Call Status:: Unsuccessful Call (1st Attempt) Unsuccessful Call (1st Attempt) Date: 02/09/24  Attempted to reach the patient regarding the most recent Inpatient/ED visit.  Follow Up Plan: Additional outreach attempts will be made to reach the patient to complete the Transitions of Care (Post Inpatient/ED visit) call.   Andrea Dimes RN, BSN [place]  [facility] RN Care Manager [phone]

## 2024-02-09 NOTE — Progress Notes (Signed)
Attempted to obtain medical history via telephone, unable to reach at this time. Unable to leave voicemail to return pre surgical testing department's phone call,due to mailbox full.  

## 2024-02-10 ENCOUNTER — Encounter (HOSPITAL_COMMUNITY): Payer: Self-pay | Admitting: Anesthesiology

## 2024-02-10 ENCOUNTER — Encounter (HOSPITAL_COMMUNITY): Admission: RE | Payer: Self-pay | Source: Home / Self Care

## 2024-02-10 ENCOUNTER — Ambulatory Visit: Payer: Self-pay | Admitting: Orthopedic Surgery

## 2024-02-10 ENCOUNTER — Ambulatory Visit (HOSPITAL_COMMUNITY): Admission: RE | Admit: 2024-02-10 | Source: Home / Self Care | Admitting: Gastroenterology

## 2024-02-10 ENCOUNTER — Telehealth (HOSPITAL_COMMUNITY): Payer: Self-pay

## 2024-02-10 SURGERY — ULTRASOUND, UPPER GI TRACT, ENDOSCOPIC
Anesthesia: Monitor Anesthesia Care

## 2024-02-10 NOTE — Telephone Encounter (Signed)
-----   Message from [place] W sent at 02/09/2024  9:16 AM EDT -----  ----- Message ----- From: Harden Jerona GAILS, MD Sent: 02/08/2024   2:59 PM EDT To: Lionel LITTIE Pear, RMA  Call patient.  He was discharged on doxycycline  antibiotic.  There are 2 bacteria in the wound and an additional antibiotic Augmentin  was called into the [facility]. ----- Message ----- From: Rebecka, Lab In [place] Sent: 02/06/2024  12:45 PM EDT To: Jerona Harden GAILS, MD

## 2024-02-10 NOTE — Progress Notes (Signed)
 Attempted to call patient multiple times about procedure. Unable to reach patient, voicemail full. No show for procedure today

## 2024-02-10 NOTE — Telephone Encounter (Signed)
 Gary Mitchell, Thank you for sending this. I am sorry he did not show up for his procedure today. Certainly we have concerns in regards to the elevated CA 19-9 and the persisting pancreatic cyst, is why this EUS was scheduled. I will have my team try to get the patient to schedule a clinic appointment with any of my APP's or myself, to discuss things further with him and at the time of that appointment he can be scheduled for his EUS, because it is important that we try to get this done, but we also cannot have multiple missed appointments. Looks like he has been dealing with some infected hardware, so could be reason why he did not show up. Interestingly, his mother did show up to the hospital to be with the patient, but was not aware of where her son was either. His PCP on here as well.  Patty, Please schedule this patientI am putting a clinic visit with APP or myself. We will schedule his EUS at that date. Thanks. GM

## 2024-02-10 NOTE — Telephone Encounter (Signed)
 Gary Mitchell was scheduled for EUS (with Dr. Wilhelmenia on 02/10/24, at [facility].   Attempted to call patient multiple times on 8/4 & 8/5 with no answer. Voicemail full. Patient no show day of procedure.  Dr Wilhelmenia & office notified.

## 2024-02-10 NOTE — Anesthesia Preprocedure Evaluation (Signed)
 Anesthesia Evaluation    Reviewed: Allergy & Precautions, Patient's Chart, lab work & pertinent test results, Unable to perform ROS - Chart review only  History of Anesthesia Complications Negative for: history of anesthetic complications  Airway        Dental  (+) Poor Dentition   Pulmonary shortness of breath, neg sleep apnea, neg COPD, Patient abstained from smoking.Not current smoker, former smoker          Cardiovascular Exercise Tolerance: Good METS(-) hypertension(-) CAD and (-) Past MI + dysrhythmias Atrial Fibrillation   Patient followed with cardiology for paroxysmal A-fib.  Last seen on 10/22/2022 by Dr. Dann.  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  Advised follow-up in 6 months but has been lost to follow-up.   Echo 09/13/2022:   1. Left ventricular ejection fraction, by estimation, is 55 to 60%. The left ventricle has normal function. The left ventricle has no regional wall motion abnormalities. Left ventricular diastolic parameters were normal. The average left ventricular global longitudinal strain is -17.9 %. The global longitudinal strain is normal.  2. Right ventricular systolic function is normal. The right ventricular size is normal. There is normal pulmonary artery systolic pressure.  3. Left atrial size was severely dilated.  4. Right atrial size was mildly dilated.  5. The mitral valve is normal in structure. Trivial mitral valve regurgitation. No evidence of mitral stenosis.  6. The aortic valve is tricuspid. Aortic valve regurgitation is not visualized. No aortic stenosis is present.  7. The inferior vena cava is normal in size with greater than 50% respiratory variability, suggesting right atrial pressure of 3 mmHg.    Neuro/Psych Seizures - (poorly controlled d/t poor medication compliance), Poorly Controlled,  PSYCHIATRIC DISORDERS Anxiety  Depression     Prior neurology notes suggest it's unclear wether patient has epilepsy versus seizures due to EtOH withdrawal.   Neuromuscular disease (peripheral neuropathy b/l LE) CVA    GI/Hepatic ,GERD  Controlled,,(+) Cirrhosis   Esophageal Varices  substance abuse  alcohol use, Hepatitis - (alcoholic hepatitis)  Endo/Other  diabetes, Well Controlled, Type 2, Insulin  Dependent, Oral Hypoglycemic Agents  Last A1c 7.1  Renal/GU Renal disease  negative genitourinary   Musculoskeletal negative musculoskeletal ROS (+)    Abdominal   Peds  Hematology  (+) Blood dyscrasia, anemia Hb 9.6, plt 187   Anesthesia Other Findings Past Medical History: 11/2019: Alcoholic hepatitis No date: Anemia No date: Anxiety No date: Atrial fibrillation (HCC) 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 No date: Seizures (HCC) No date: Seizures (HCC) No date: Thrombocytopenia (HCC)  Reproductive/Obstetrics negative OB ROS  Anesthesia Physical Anesthesia Plan  ASA: 3  Anesthesia Plan: MAC   Post-op Pain Management: Minimal or no pain anticipated   Induction: Intravenous  PONV Risk Score and Plan: 2 and Ondansetron , Treatment may vary due to age or medical condition, Propofol  infusion and TIVA  Airway Management Planned: Natural  Airway  Additional Equipment: None  Intra-op Plan:   Post-operative Plan:   Informed Consent:   Plan Discussed with:   Anesthesia Plan Comments:          Anesthesia Quick Evaluation

## 2024-02-10 NOTE — Telephone Encounter (Signed)
 Called pt, went straight to VM which was full. Will try again later.

## 2024-02-11 ENCOUNTER — Telehealth: Payer: Self-pay | Admitting: *Deleted

## 2024-02-11 ENCOUNTER — Encounter: Admitting: Family

## 2024-02-11 NOTE — Transitions of Care (Post Inpatient/ED Visit) (Signed)
   02/11/2024  Name: Gary Mitchell MRN: [id number] DOB: 29-Aug-1970  Today's TOC FU Call Status: Today's TOC FU Call Status:: Unsuccessful Call (2nd Attempt) Unsuccessful Call (2nd Attempt) Date: 02/11/24  Attempted to reach the patient regarding the most recent Inpatient/ED visit.  Follow Up Plan: Additional outreach attempts will be made to reach the patient to complete the Transitions of Care (Post Inpatient/ED visit) call.   Andrea Dimes RN, BSN [place]  [facility] RN Care Manager [phone]

## 2024-02-11 NOTE — Telephone Encounter (Signed)
 Attempted to reach pt via phone mail box is full- will mail letter

## 2024-02-12 ENCOUNTER — Telehealth: Payer: Self-pay

## 2024-02-12 NOTE — Transitions of Care (Post Inpatient/ED Visit) (Signed)
   02/12/2024  Name: Gary Mitchell MRN: [id number] DOB: 1971-04-13  Today's TOC FU Call Status: Today's TOC FU Call Status:: Unsuccessful Call (3rd Attempt) Unsuccessful Call (3rd Attempt) Date: 02/12/24  Attempted to reach the patient regarding the most recent Inpatient/ED visit.  Follow Up Plan: No further outreach attempts will be made at this time. We have been unable to contact the patient.  Makailah Slavick J. Nichael Ehly RN, MSN   [facility], [facility] RN Care Manager Direct Dial : [phone]  Fax: [phone] Website: [email or web address]

## 2024-02-16 ENCOUNTER — Other Ambulatory Visit (HOSPITAL_COMMUNITY): Payer: Self-pay

## 2024-02-16 ENCOUNTER — Other Ambulatory Visit: Payer: Self-pay

## 2024-02-17 ENCOUNTER — Ambulatory Visit: Admitting: Family Medicine

## 2024-02-17 DIAGNOSIS — Z419 Encounter for procedure for purposes other than remedying health state, unspecified: Secondary | ICD-10-CM | POA: Diagnosis not present

## 2024-02-28 ENCOUNTER — Emergency Department (HOSPITAL_COMMUNITY)

## 2024-02-28 ENCOUNTER — Inpatient Hospital Stay (HOSPITAL_COMMUNITY)
Admission: EM | Admit: 2024-02-28 | Discharge: 2024-03-04 | DRG: 559 | Disposition: A | Discharge status: Home / Self Care | Attending: Internal Medicine | Admitting: Internal Medicine

## 2024-02-28 DIAGNOSIS — S8261XA Displaced fracture of lateral malleolus of right fibula, initial encounter for closed fracture: Secondary | ICD-10-CM | POA: Diagnosis not present

## 2024-02-28 DIAGNOSIS — M86271 Subacute osteomyelitis, right ankle and foot: Secondary | ICD-10-CM | POA: Diagnosis not present

## 2024-02-28 DIAGNOSIS — R739 Hyperglycemia, unspecified: Principal | ICD-10-CM

## 2024-02-28 DIAGNOSIS — D649 Anemia, unspecified: Secondary | ICD-10-CM | POA: Diagnosis not present

## 2024-02-28 DIAGNOSIS — I4891 Unspecified atrial fibrillation: Secondary | ICD-10-CM | POA: Diagnosis present

## 2024-02-28 DIAGNOSIS — D509 Iron deficiency anemia, unspecified: Secondary | ICD-10-CM | POA: Diagnosis present

## 2024-02-28 DIAGNOSIS — Z472 Encounter for removal of internal fixation device: Secondary | ICD-10-CM | POA: Diagnosis not present

## 2024-02-28 DIAGNOSIS — E1165 Type 2 diabetes mellitus with hyperglycemia: Secondary | ICD-10-CM | POA: Diagnosis not present

## 2024-02-28 DIAGNOSIS — K7030 Alcoholic cirrhosis of liver without ascites: Secondary | ICD-10-CM | POA: Diagnosis present

## 2024-02-28 DIAGNOSIS — S8251XA Displaced fracture of medial malleolus of right tibia, initial encounter for closed fracture: Secondary | ICD-10-CM | POA: Diagnosis not present

## 2024-02-28 DIAGNOSIS — I480 Paroxysmal atrial fibrillation: Secondary | ICD-10-CM | POA: Diagnosis not present

## 2024-02-28 DIAGNOSIS — D62 Acute posthemorrhagic anemia: Secondary | ICD-10-CM | POA: Diagnosis present

## 2024-02-28 DIAGNOSIS — Z888 Allergy status to other drugs, medicaments and biological substances status: Secondary | ICD-10-CM

## 2024-02-28 DIAGNOSIS — K449 Diaphragmatic hernia without obstruction or gangrene: Secondary | ICD-10-CM | POA: Diagnosis present

## 2024-02-28 DIAGNOSIS — G9341 Metabolic encephalopathy: Secondary | ICD-10-CM | POA: Diagnosis not present

## 2024-02-28 DIAGNOSIS — Z5900 Homelessness unspecified: Secondary | ICD-10-CM

## 2024-02-28 DIAGNOSIS — Y792 Prosthetic and other implants, materials and accessory orthopedic devices associated with adverse incidents: Secondary | ICD-10-CM | POA: Diagnosis present

## 2024-02-28 DIAGNOSIS — Z5986 Financial insecurity: Secondary | ICD-10-CM

## 2024-02-28 DIAGNOSIS — T84620A Infection and inflammatory reaction due to internal fixation device of right femur, initial encounter: Principal | ICD-10-CM | POA: Diagnosis present

## 2024-02-28 DIAGNOSIS — S52602A Unspecified fracture of lower end of left ulna, initial encounter for closed fracture: Secondary | ICD-10-CM | POA: Diagnosis present

## 2024-02-28 DIAGNOSIS — K766 Portal hypertension: Secondary | ICD-10-CM | POA: Diagnosis present

## 2024-02-28 DIAGNOSIS — F418 Other specified anxiety disorders: Secondary | ICD-10-CM | POA: Diagnosis not present

## 2024-02-28 DIAGNOSIS — E1169 Type 2 diabetes mellitus with other specified complication: Secondary | ICD-10-CM | POA: Diagnosis present

## 2024-02-28 DIAGNOSIS — M009 Pyogenic arthritis, unspecified: Secondary | ICD-10-CM | POA: Diagnosis not present

## 2024-02-28 DIAGNOSIS — M609 Myositis, unspecified: Secondary | ICD-10-CM | POA: Diagnosis present

## 2024-02-28 DIAGNOSIS — T84622A Infection and inflammatory reaction due to internal fixation device of right tibia, initial encounter: Secondary | ICD-10-CM | POA: Diagnosis not present

## 2024-02-28 DIAGNOSIS — R600 Localized edema: Secondary | ICD-10-CM | POA: Diagnosis not present

## 2024-02-28 DIAGNOSIS — K862 Cyst of pancreas: Secondary | ICD-10-CM | POA: Diagnosis present

## 2024-02-28 DIAGNOSIS — T84126A Displacement of internal fixation device of bone of right lower leg, initial encounter: Secondary | ICD-10-CM | POA: Diagnosis present

## 2024-02-28 DIAGNOSIS — S91001A Unspecified open wound, right ankle, initial encounter: Secondary | ICD-10-CM | POA: Diagnosis not present

## 2024-02-28 DIAGNOSIS — R569 Unspecified convulsions: Secondary | ICD-10-CM

## 2024-02-28 DIAGNOSIS — R4182 Altered mental status, unspecified: Secondary | ICD-10-CM | POA: Diagnosis present

## 2024-02-28 DIAGNOSIS — K3189 Other diseases of stomach and duodenum: Secondary | ICD-10-CM | POA: Diagnosis not present

## 2024-02-28 DIAGNOSIS — M86171 Other acute osteomyelitis, right ankle and foot: Secondary | ICD-10-CM | POA: Diagnosis not present

## 2024-02-28 DIAGNOSIS — L97319 Non-pressure chronic ulcer of right ankle with unspecified severity: Secondary | ICD-10-CM | POA: Diagnosis not present

## 2024-02-28 DIAGNOSIS — F1010 Alcohol abuse, uncomplicated: Secondary | ICD-10-CM | POA: Diagnosis present

## 2024-02-28 DIAGNOSIS — Z8614 Personal history of Methicillin resistant Staphylococcus aureus infection: Secondary | ICD-10-CM

## 2024-02-28 DIAGNOSIS — E872 Acidosis, unspecified: Secondary | ICD-10-CM | POA: Diagnosis present

## 2024-02-28 DIAGNOSIS — K7010 Alcoholic hepatitis without ascites: Secondary | ICD-10-CM | POA: Diagnosis present

## 2024-02-28 DIAGNOSIS — K2950 Unspecified chronic gastritis without bleeding: Secondary | ICD-10-CM | POA: Diagnosis not present

## 2024-02-28 DIAGNOSIS — Z833 Family history of diabetes mellitus: Secondary | ICD-10-CM

## 2024-02-28 DIAGNOSIS — K250 Acute gastric ulcer with hemorrhage: Secondary | ICD-10-CM | POA: Diagnosis present

## 2024-02-28 DIAGNOSIS — Z743 Need for continuous supervision: Secondary | ICD-10-CM | POA: Diagnosis not present

## 2024-02-28 DIAGNOSIS — Z794 Long term (current) use of insulin: Secondary | ICD-10-CM

## 2024-02-28 DIAGNOSIS — K7460 Unspecified cirrhosis of liver: Secondary | ICD-10-CM | POA: Diagnosis not present

## 2024-02-28 DIAGNOSIS — E1140 Type 2 diabetes mellitus with diabetic neuropathy, unspecified: Secondary | ICD-10-CM | POA: Diagnosis not present

## 2024-02-28 DIAGNOSIS — Z5941 Food insecurity: Secondary | ICD-10-CM

## 2024-02-28 DIAGNOSIS — I8510 Secondary esophageal varices without bleeding: Secondary | ICD-10-CM | POA: Diagnosis not present

## 2024-02-28 DIAGNOSIS — K254 Chronic or unspecified gastric ulcer with hemorrhage: Secondary | ICD-10-CM | POA: Diagnosis not present

## 2024-02-28 DIAGNOSIS — S82401A Unspecified fracture of shaft of right fibula, initial encounter for closed fracture: Secondary | ICD-10-CM | POA: Diagnosis not present

## 2024-02-28 DIAGNOSIS — Z87891 Personal history of nicotine dependence: Secondary | ICD-10-CM

## 2024-02-28 DIAGNOSIS — E1142 Type 2 diabetes mellitus with diabetic polyneuropathy: Secondary | ICD-10-CM | POA: Diagnosis present

## 2024-02-28 DIAGNOSIS — Z1621 Resistance to vancomycin: Secondary | ICD-10-CM | POA: Diagnosis present

## 2024-02-28 DIAGNOSIS — K219 Gastro-esophageal reflux disease without esophagitis: Secondary | ICD-10-CM

## 2024-02-28 DIAGNOSIS — Z604 Social exclusion and rejection: Secondary | ICD-10-CM | POA: Diagnosis present

## 2024-02-28 DIAGNOSIS — I499 Cardiac arrhythmia, unspecified: Secondary | ICD-10-CM | POA: Diagnosis not present

## 2024-02-28 DIAGNOSIS — Z7901 Long term (current) use of anticoagulants: Secondary | ICD-10-CM

## 2024-02-28 DIAGNOSIS — F1090 Alcohol use, unspecified, uncomplicated: Secondary | ICD-10-CM | POA: Diagnosis not present

## 2024-02-28 DIAGNOSIS — K7040 Alcoholic hepatic failure without coma: Secondary | ICD-10-CM | POA: Diagnosis not present

## 2024-02-28 DIAGNOSIS — K2970 Gastritis, unspecified, without bleeding: Secondary | ICD-10-CM | POA: Diagnosis present

## 2024-02-28 DIAGNOSIS — I6782 Cerebral ischemia: Secondary | ICD-10-CM | POA: Diagnosis not present

## 2024-02-28 DIAGNOSIS — E119 Type 2 diabetes mellitus without complications: Secondary | ICD-10-CM | POA: Diagnosis not present

## 2024-02-28 DIAGNOSIS — D508 Other iron deficiency anemias: Secondary | ICD-10-CM | POA: Diagnosis not present

## 2024-02-28 DIAGNOSIS — I8500 Esophageal varices without bleeding: Secondary | ICD-10-CM | POA: Diagnosis not present

## 2024-02-28 DIAGNOSIS — A419 Sepsis, unspecified organism: Secondary | ICD-10-CM | POA: Diagnosis not present

## 2024-02-28 DIAGNOSIS — R400 Somnolence: Secondary | ICD-10-CM | POA: Diagnosis not present

## 2024-02-28 DIAGNOSIS — R404 Transient alteration of awareness: Secondary | ICD-10-CM | POA: Diagnosis not present

## 2024-02-28 DIAGNOSIS — Z7984 Long term (current) use of oral hypoglycemic drugs: Secondary | ICD-10-CM

## 2024-02-28 DIAGNOSIS — Z5982 Transportation insecurity: Secondary | ICD-10-CM

## 2024-02-28 DIAGNOSIS — F419 Anxiety disorder, unspecified: Secondary | ICD-10-CM | POA: Diagnosis present

## 2024-02-28 DIAGNOSIS — G9389 Other specified disorders of brain: Secondary | ICD-10-CM | POA: Diagnosis not present

## 2024-02-28 DIAGNOSIS — M7989 Other specified soft tissue disorders: Secondary | ICD-10-CM | POA: Diagnosis not present

## 2024-02-28 LAB — I-STAT VENOUS BLOOD GAS, ED
Acid-base deficit: 4 mmol/L — ABNORMAL HIGH (ref 0.0–2.0)
Bicarbonate: 20.5 mmol/L (ref 20.0–28.0)
Calcium, Ion: 1.07 mmol/L — ABNORMAL LOW (ref 1.15–1.40)
HCT: 23 % — ABNORMAL LOW (ref 39.0–52.0)
Hemoglobin: 7.8 g/dL — ABNORMAL LOW (ref 13.0–17.0)
O2 Saturation: 99 %
Potassium: 6.6 mmol/L (ref 3.5–5.1)
Sodium: 136 mmol/L (ref 135–145)
TCO2: 21 mmol/L — ABNORMAL LOW (ref 22–32)
pCO2, Ven: 33 mmHg — ABNORMAL LOW (ref 44–60)
pH, Ven: 7.4 (ref 7.25–7.43)
pO2, Ven: 143 mmHg — ABNORMAL HIGH (ref 32–45)

## 2024-02-28 LAB — URINALYSIS, W/ REFLEX TO CULTURE (INFECTION SUSPECTED)
Bilirubin Urine: NEGATIVE
Glucose, UA: >=500 mg/dL — AB
Ketones, ur: NEGATIVE mg/dL
Leukocytes,Ua: NEGATIVE
Nitrite: NEGATIVE
Protein, ur: NEGATIVE mg/dL
Specific Gravity, Urine: 1.023 (ref 1.005–1.030)
pH: 6 (ref 5.0–8.0)

## 2024-02-28 LAB — CBC WITH DIFFERENTIAL/PLATELET
Basophils Absolute: 0.2 K/uL — ABNORMAL HIGH (ref 0.0–0.1)
Basophils Relative: 3 %
Eosinophils Absolute: 0.1 K/uL (ref 0.0–0.5)
Eosinophils Relative: 1 %
HCT: 20.8 % — ABNORMAL LOW (ref 39.0–52.0)
Hemoglobin: 6.4 g/dL — CL (ref 13.0–17.0)
Lymphocytes Relative: 23 %
Lymphs Abs: 1.4 K/uL (ref 0.7–4.0)
MCH: 23.4 pg — ABNORMAL LOW (ref 26.0–34.0)
MCHC: 30.8 g/dL (ref 30.0–36.0)
MCV: 76.2 fL — ABNORMAL LOW (ref 80.0–100.0)
Monocytes Absolute: 0.6 K/uL (ref 0.1–1.0)
Monocytes Relative: 10 %
Neutro Abs: 4 K/uL (ref 1.7–7.7)
Neutrophils Relative %: 63 %
Platelets: 156 K/uL (ref 150–400)
RBC: 2.73 MIL/uL — ABNORMAL LOW (ref 4.22–5.81)
RDW: 25.2 % — ABNORMAL HIGH (ref 11.5–15.5)
WBC: 6.3 K/uL (ref 4.0–10.5)
nRBC: 0 % (ref 0.0–0.2)

## 2024-02-28 LAB — COMPREHENSIVE METABOLIC PANEL WITH GFR
ALT: 20 U/L (ref 0–44)
AST: 36 U/L (ref 15–41)
Albumin: 2.8 g/dL — ABNORMAL LOW (ref 3.5–5.0)
Alkaline Phosphatase: 277 U/L — ABNORMAL HIGH (ref 38–126)
Anion gap: 16 — ABNORMAL HIGH (ref 5–15)
BUN: 7 mg/dL (ref 6–20)
CO2: 19 mmol/L — ABNORMAL LOW (ref 22–32)
Calcium: 9.3 mg/dL (ref 8.9–10.3)
Chloride: 102 mmol/L (ref 98–111)
Creatinine, Ser: 0.78 mg/dL (ref 0.61–1.24)
GFR, Estimated: >60 mL/min (ref >60)
Glucose, Bld: 557 mg/dL (ref 70–99)
Potassium: 4.1 mmol/L (ref 3.5–5.1)
Sodium: 137 mmol/L (ref 135–145)
Total Bilirubin: 2.2 mg/dL — ABNORMAL HIGH (ref 0.0–1.2)
Total Protein: 9.1 g/dL — ABNORMAL HIGH (ref 6.5–8.1)

## 2024-02-28 LAB — PROTIME-INR
INR: 1.4 — ABNORMAL HIGH (ref 0.8–1.2)
Prothrombin Time: 17.5 s — ABNORMAL HIGH (ref 11.4–15.2)

## 2024-02-28 LAB — PREPARE RBC (CROSSMATCH)

## 2024-02-28 LAB — I-STAT CG4 LACTIC ACID, ED
Lactic Acid, Venous: 7.3 mmol/L (ref 0.5–1.9)
Lactic Acid, Venous: 4.6 mmol/L (ref 0.5–1.9)

## 2024-02-28 LAB — POC OCCULT BLOOD, ED: Fecal Occult Bld: NEGATIVE

## 2024-02-28 LAB — ABO/RH

## 2024-02-28 LAB — BETA-HYDROXYBUTYRIC ACID: Beta-Hydroxybutyric Acid: 0.58 mmol/L — ABNORMAL HIGH (ref 0.05–0.27)

## 2024-02-28 MED ORDER — FOLIC ACID 1 MG PO TABS
1.0000 mg | ORAL_TABLET | Freq: Every day | ORAL | Status: DC
  Administered 2024-02-29 – 2024-03-04 (×5): 1 mg via ORAL
  Filled 2024-02-28 (×5): qty 1

## 2024-02-28 MED ORDER — THIAMINE MONONITRATE 100 MG PO TABS
100.0000 mg | ORAL_TABLET | Freq: Every day | ORAL | Status: DC
  Administered 2024-02-29 – 2024-03-04 (×5): 100 mg via ORAL
  Filled 2024-02-28 (×5): qty 1

## 2024-02-28 MED ORDER — SODIUM CHLORIDE 0.9% IV SOLUTION
Freq: Once | INTRAVENOUS | Status: AC
  Administered 2024-02-29: 10 mL/h via INTRAVENOUS

## 2024-02-28 MED ORDER — ADULT MULTIVITAMIN W/MINERALS CH
1.0000 | ORAL_TABLET | Freq: Every day | ORAL | Status: DC
  Administered 2024-02-29 – 2024-03-03 (×4): 1 via ORAL
  Filled 2024-02-28 (×4): qty 1

## 2024-02-28 MED ORDER — METFORMIN HCL 500 MG PO TABS: 1000.0000 mg | ORAL_TABLET | Freq: Two times a day (BID) | ORAL | Status: DC

## 2024-02-28 MED ORDER — INSULIN ASPART 100 UNIT/ML IJ SOLN
0.0000 [IU] | Freq: Three times a day (TID) | INTRAMUSCULAR | Status: DC
  Administered 2024-02-29: 5 [IU] via SUBCUTANEOUS
  Administered 2024-02-29: 3 [IU] via SUBCUTANEOUS
  Administered 2024-02-29: 8 [IU] via SUBCUTANEOUS
  Administered 2024-03-01: 11 [IU] via SUBCUTANEOUS
  Administered 2024-03-01: 5 [IU] via SUBCUTANEOUS
  Administered 2024-03-01 – 2024-03-02 (×2): 11 [IU] via SUBCUTANEOUS
  Administered 2024-03-02 (×2): 8 [IU] via SUBCUTANEOUS
  Administered 2024-03-03: 5 [IU] via SUBCUTANEOUS
  Administered 2024-03-03: 15 [IU] via SUBCUTANEOUS
  Administered 2024-03-03: 5 [IU] via SUBCUTANEOUS
  Administered 2024-03-04: 8 [IU] via SUBCUTANEOUS

## 2024-02-28 MED ORDER — ACETAMINOPHEN 500 MG PO TABS
1000.0000 mg | ORAL_TABLET | Freq: Two times a day (BID) | ORAL | Status: DC | PRN
  Administered 2024-02-29 – 2024-03-02 (×5): 1000 mg via ORAL
  Filled 2024-02-28 (×5): qty 2

## 2024-02-28 MED ORDER — METOPROLOL SUCCINATE ER 25 MG PO TB24
12.5000 mg | ORAL_TABLET | Freq: Every day | ORAL | Status: DC
  Administered 2024-02-28: 12.5 mg via ORAL
  Filled 2024-02-28 (×2): qty 1

## 2024-02-28 MED ORDER — INSULIN ASPART 100 UNIT/ML IJ SOLN
10.0000 [IU] | Freq: Once | INTRAMUSCULAR | Status: AC
  Administered 2024-02-28: 10 [IU] via SUBCUTANEOUS

## 2024-02-28 MED ORDER — VANCOMYCIN HCL 1500 MG/300ML IV SOLN
1500.0000 mg | Freq: Once | INTRAVENOUS | Status: AC
  Administered 2024-02-28: 1500 mg via INTRAVENOUS
  Filled 2024-02-28: qty 300

## 2024-02-28 MED ORDER — VANCOMYCIN HCL IN DEXTROSE 1-5 GM/200ML-% IV SOLN
1000.0000 mg | Freq: Two times a day (BID) | INTRAVENOUS | Status: DC
  Administered 2024-02-29: 1000 mg via INTRAVENOUS
  Filled 2024-02-28: qty 200

## 2024-02-28 MED ORDER — SODIUM CHLORIDE 0.9 % IV BOLUS
1000.0000 mL | Freq: Once | INTRAVENOUS | Status: AC
  Administered 2024-02-28: 1000 mL via INTRAVENOUS

## 2024-02-28 MED ORDER — SODIUM CHLORIDE 0.9 % IV SOLN
1.0000 g | Freq: Three times a day (TID) | INTRAVENOUS | Status: DC
  Administered 2024-02-28 – 2024-02-29 (×2): 1 g via INTRAVENOUS
  Filled 2024-02-28 (×3): qty 20

## 2024-02-28 MED ORDER — MIDAZOLAM HCL 2 MG/2ML IJ SOLN
1.0000 mg | Freq: Once | INTRAMUSCULAR | Status: AC | PRN
  Administered 2024-02-29: 1 mg via INTRAVENOUS
  Filled 2024-02-28: qty 2

## 2024-02-28 MED ORDER — ONDANSETRON HCL 4 MG PO TABS: 4.0000 mg | ORAL_TABLET | Freq: Four times a day (QID) | ORAL | Status: DC | PRN

## 2024-02-28 MED ORDER — THIAMINE HCL 100 MG/ML IJ SOLN
100.0000 mg | Freq: Every day | INTRAMUSCULAR | Status: DC
  Filled 2024-02-28 (×2): qty 2

## 2024-02-28 MED ORDER — PANTOPRAZOLE SODIUM 40 MG IV SOLR
40.0000 mg | Freq: Two times a day (BID) | INTRAVENOUS | Status: DC
  Administered 2024-02-28 – 2024-03-04 (×10): 40 mg via INTRAVENOUS
  Filled 2024-02-28 (×10): qty 10

## 2024-02-28 MED ORDER — LEVETIRACETAM 500 MG PO TABS
500.0000 mg | ORAL_TABLET | Freq: Two times a day (BID) | ORAL | Status: DC
  Administered 2024-02-28 – 2024-02-29 (×2): 500 mg via ORAL
  Filled 2024-02-28 (×2): qty 1

## 2024-02-28 MED ORDER — ONDANSETRON HCL 4 MG/2ML IJ SOLN
4.0000 mg | Freq: Four times a day (QID) | INTRAMUSCULAR | Status: DC | PRN
Start: 2024-02-28 — End: 2024-03-04

## 2024-02-28 MED ORDER — THIAMINE HCL 100 MG/ML IJ SOLN
500.0000 mg | Freq: Once | INTRAVENOUS | Status: AC
  Administered 2024-02-28: 500 mg via INTRAVENOUS
  Filled 2024-02-28: qty 500

## 2024-02-28 NOTE — H&P (Addendum)
 History and Physical    Patient: Gary Mitchell DOB: Jul 16, 1970 DOA: 02/28/2024 DOS: the patient was seen and examined on 02/28/2024 . PCP: Delbert Clam, MD  Patient coming from: Home Chief complaint: Seizures.   HPI:  Gary Mitchell is a 53 y.o. male with medical history of alcohol abuse last drink was today in am and withdrawal seizures, alcoholic hepatitis, cirrhosis and coagulopathy, anxiety, A-fib on Eliquis , diabetes mellitus type 2, neuropathy in both his feet, pancreatic lesion, history of pancreatitis, anemia, allergies to NSAIDs and iohexol , Patient is status post open reduction internal fixation for trimalleolar right ankle fracture on first.  Was seen in the office on July 28 for odor and drainage and plan for removal of infected hardware done on 04 February 2024.  Patient is presenting today where he was found by the neighbors on the ground with no recollection of passing out patient does have a history of seizures and initial report patient had left arm swelling and left foot that was infected initial blood sugar reported to be 500+ tachycardia with a heart rate of 124 blood pressure 112/62 and O2 sats of 98 on room air with a respiratory rate of 27.Pt is destitute and that limits him to get meds and take care of his needs.   ED Course:  Vital signs in the ED were notable for the following: Vitals since arrival show tachycardia. Vitals:   02/28/24 1935 02/28/24 2022 02/28/24 2200 02/28/24 2245  BP: (!) 149/83     Pulse:  (!) 110  99  Temp:  98.9 F (37.2 C)    Resp: 17   17  Height:   5' 6 (1.676 m)   Weight:   64 kg   SpO2:    100%  TempSrc:      BMI (Calculated):   22.78   >>ED evaluation thus far shows: Venous blood gas with a pH of 7.4 pCO2 of 33P O2 of 143. Initial CMP showing bicarb of 19 anion gap of 16 lactic acid of 7.3, repeat lactic of 4.6.  Ethanol level added and pending normal kidney function, total protein of 9.1 total bili of 2.2 albumin of  2.8.  INR of 1.4, beta hydroxy of 0.58 Initial CBC showed hemoglobin of 6.4 platelets of 156 WBC of 6.3. Of note patient does have an elevated CA 19-9 of 262  on his recent blood work I am not sure if he has an underlying GI cancer will defer to PCP and GI to follow. Urinalysis is positive for glucose 0-5 WBCs and 0-5 RBCs. Head CT is negative today for any acute intracranial abnormality he does have generalized cerebral atrophy with chronic white matter small vessel ischemic changes. 2D Echo 09/2022:  1. Left ventricular ejection fraction, by estimation, is 55 to 60%. The  left ventricle has normal function. The left ventricle has no regional  wall motion abnormalities. Left ventricular diastolic parameters were  normal. The average left ventricular  global longitudinal strain is -17.9 %. The global longitudinal strain is  normal.   2. Right ventricular systolic function is normal. The right ventricular  size is normal. There is normal pulmonary artery systolic pressure.   3. Left atrial size was severely dilated.   4. Right atrial size was mildly dilated.   5. The mitral valve is normal in structure. Trivial mitral valve  regurgitation. No evidence of mitral stenosis.   6. The aortic valve is tricuspid. Aortic valve regurgitation is not  visualized. No aortic  stenosis is present.   7. The inferior vena cava is normal in size with greater than 50%  respiratory variability, suggesting right atrial pressure of 3 mmHg.    >>While in the ED patient received the following: Medications  0.9 %  sodium chloride  infusion (Manually program via Guardrails IV Fluids) (has no administration in time range)  metoprolol  succinate (TOPROL -XL) 24 hr tablet 12.5 mg (has no administration in time range)  metFORMIN  (GLUCOPHAGE ) tablet 1,000 mg (has no administration in time range)  levETIRAcetam  (KEPPRA ) tablet 500 mg (has no administration in time range)  thiamine  (VITAMIN B1) 500 mg in sodium chloride  0.9  % 50 mL IVPB (has no administration in time range)  pantoprazole  (PROTONIX ) injection 40 mg (has no administration in time range)  sodium chloride  0.9 % bolus 1,000 mL (1,000 mLs Intravenous New Bag/Given 02/28/24 2022)  insulin  aspart (novoLOG ) injection 10 Units (10 Units Subcutaneous Given 02/28/24 2030)   Review of Systems  Unable to perform ROS: Acuity of condition   Past Medical History:  Diagnosis Date   Alcoholic hepatitis 11/2019   Anemia    Anxiety    Atrial fibrillation (HCC)    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   Seizures (HCC)    Seizures (HCC)    Thrombocytopenia (HCC)    Past Surgical History:  Procedure Laterality Date   BIOPSY  08/02/2021   Procedure: BIOPSY;  Surgeon: Teressa Toribio SQUIBB, MD;  Location: [facility];  Service: Endoscopy;;   BIOPSY  05/07/2022   Procedure: BIOPSY;  Surgeon: Stacia Glendia BRAVO, MD;  Location: [facility];  Service: Gastroenterology;;   COLONOSCOPY WITH PROPOFOL  N/A 05/08/2022   Procedure: COLONOSCOPY WITH PROPOFOL ;  Surgeon: Stacia Glendia BRAVO, MD;  Location: [facility];  Service: Gastroenterology;  Laterality: N/A;   ENTEROSCOPY N/A 09/20/2020   Procedure: ENTEROSCOPY;  Surgeon: Eda Iha, MD;  Location: [facility];  Service: Gastroenterology;  Laterality: N/A;   ENTEROSCOPY N/A 05/11/2022   Procedure: ENTEROSCOPY;  Surgeon: Stacia Glendia BRAVO, MD;  Location: [facility];  Service: Gastroenterology;  Laterality: N/A;   ESOPHAGOGASTRODUODENOSCOPY  (EGD) WITH PROPOFOL  N/A 08/02/2021   Procedure: ESOPHAGOGASTRODUODENOSCOPY (EGD) WITH PROPOFOL ;  Surgeon: Teressa Toribio SQUIBB, MD;  Location: [facility];  Service: Endoscopy;  Laterality: N/A;   ESOPHAGOGASTRODUODENOSCOPY (EGD) WITH PROPOFOL  N/A 01/31/2022   Procedure: ESOPHAGOGASTRODUODENOSCOPY (EGD) WITH PROPOFOL ;  Surgeon: Teressa Toribio SQUIBB, MD;  Location: [facility];  Service: Gastroenterology;  Laterality: N/A;   ESOPHAGOGASTRODUODENOSCOPY (EGD) WITH PROPOFOL  N/A 05/07/2022   Procedure: ESOPHAGOGASTRODUODENOSCOPY (EGD) WITH PROPOFOL ;  Surgeon: Stacia Glendia BRAVO, MD;  Location: [facility];  Service: Gastroenterology;  Laterality: N/A;   ESOPHAGOGASTRODUODENOSCOPY (EGD) WITH PROPOFOL  N/A 06/13/2022   Procedure: ESOPHAGOGASTRODUODENOSCOPY (EGD) WITH PROPOFOL ;  Surgeon: Wilhelmenia Aloha Raddle., MD;  Location: [facility];  Service: Gastroenterology;  Laterality: N/A;   EUS N/A 01/31/2022   Procedure: UPPER ENDOSCOPIC ULTRASOUND (EUS) RADIAL;  Surgeon: Teressa Toribio SQUIBB, MD;  Location: [facility];  Service: Gastroenterology;  Laterality: N/A;  EUS N/A 06/13/2022   Procedure: UPPER ENDOSCOPIC ULTRASOUND (EUS) LINEAR;  Surgeon: Wilhelmenia Aloha Raddle., MD;  Location: [facility];  Service: Gastroenterology;  Laterality: N/A;   FINE NEEDLE ASPIRATION N/A 08/02/2021   Procedure: FINE NEEDLE ASPIRATION (FNA) LINEAR;  Surgeon: Teressa Toribio SQUIBB, MD;  Location: [facility];  Service: Endoscopy;  Laterality: N/A;   FINE NEEDLE ASPIRATION N/A 01/31/2022   Procedure: FINE NEEDLE ASPIRATION (FNA) LINEAR;  Surgeon: Teressa Toribio SQUIBB, MD;  Location: [facility];  Service: Gastroenterology;  Laterality: N/A;   FINE NEEDLE ASPIRATION N/A 06/13/2022   Procedure: FINE NEEDLE ASPIRATION (FNA) LINEAR;  Surgeon: Wilhelmenia Aloha Raddle., MD;  Location: [facility];  Service: Gastroenterology;  Laterality: N/A;   GIVENS CAPSULE STUDY N/A 05/09/2022   Procedure: GIVENS CAPSULE STUDY;  Surgeon: Stacia Glendia BRAVO, MD;   Location: [facility];  Service: Gastroenterology;  Laterality: N/A;   HARDWARE REMOVAL Right 02/04/2024   Procedure: REMOVAL, HARDWARE;  Surgeon: Harden Jerona GAILS, MD;  Location: [facility] OR;  Service: Orthopedics;  Laterality: Right;  HARDWARE REMOVAL RIGHT ANKLE   HEMOSTASIS CLIP PLACEMENT  05/08/2022   Procedure: HEMOSTASIS CLIP PLACEMENT;  Surgeon: Stacia Glendia BRAVO, MD;  Location: [facility];  Service: Gastroenterology;;   HEMOSTASIS CLIP PLACEMENT  05/11/2022   Procedure: HEMOSTASIS CLIP PLACEMENT;  Surgeon: Stacia Glendia BRAVO, MD;  Location: [facility];  Service: Gastroenterology;;   HOT HEMOSTASIS N/A 06/13/2022   Procedure: HOT HEMOSTASIS (ARGON PLASMA COAGULATION/BICAP);  Surgeon: Wilhelmenia Aloha Raddle., MD;  Location: [facility];  Service: Gastroenterology;  Laterality: N/A;   left kidney removed     ORIF ANKLE FRACTURE Right 01/06/2024   Procedure: OPEN REDUCTION INTERNAL FIXATION (ORIF) ANKLE FRACTURE;  Surgeon: Beverley Evalene BIRCH, MD;  Location: [facility];  Service: Orthopedics;  Laterality: Right;   POLYPECTOMY  05/08/2022   Procedure: POLYPECTOMY;  Surgeon: Stacia Glendia BRAVO, MD;  Location: [facility];  Service: Gastroenterology;;   UPPER ESOPHAGEAL ENDOSCOPIC ULTRASOUND (EUS) N/A 08/02/2021   Procedure: UPPER ESOPHAGEAL ENDOSCOPIC ULTRASOUND (EUS);  Surgeon: Teressa Toribio SQUIBB, MD;  Location: [facility];  Service: Endoscopy;  Laterality: N/A;  Radial and Linear    reports that he quit smoking about 6 years ago. His smoking use included cigarettes. He started smoking about 31 years ago. He has a 2.5 pack-year smoking history. He has been exposed to tobacco smoke. He has never used smokeless tobacco. He reports that he does not currently use alcohol. He reports that he does not currently use drugs. Allergies  Allergen Reactions   Iohexol  Other (See Comments)    Unknown reaction at 33 days old Mom at bedside reported patient was given injections of iohexol  in foot and resulted in hole  in foot or foot got infected   Nsaids Other (See Comments)    Patient said he was told he could take Tylenol  ONLY.   Family History  Problem Relation Age of Onset   Diabetes Mellitus II Mother    Colon cancer Neg Hx    Esophageal cancer Neg Hx    Inflammatory bowel disease Neg Hx    Liver disease Neg Hx    Pancreatic cancer Neg Hx    Rectal cancer Neg Hx    Stomach cancer Neg Hx    Prior to Admission medications   Medication Sig Start Date End Date Taking? Authorizing Provider  acetaminophen  (TYLENOL ) 500 MG tablet Take 1,000 mg by mouth 2 (two) times daily as needed for moderate pain (pain score 4-6), fever or headache.    [provider]  amoxicillin -clavulanate (AUGMENTIN ) 875-125 MG tablet Take 1 tablet by mouth 2 (two) times daily. 02/08/24   Harden Jerona GAILS, MD  apixaban  (ELIQUIS ) 2.5 MG TABS tablet Take 1 tablet (2.5 mg total) by mouth 2 (two) times daily for 28 days. Patient not taking: Reported on 02/05/2024 01/08/24 02/05/24  Drusilla Sabas RAMAN, MD  atorvastatin  (LIPITOR) 20 MG tablet Take 1 tablet (20 mg total) by mouth daily. Patient not taking: Reported on 02/05/2024 04/02/23   Laurence Locus, DO  Continuous Glucose Sensor (FREESTYLE LIBRE 3 PLUS SENSOR) MISC Change sensor every 15 days. Use to check blood sugar continuously. 12/15/23   Newlin, Enobong, MD  doxycycline  (VIBRA -TABS) 100 MG tablet Take 1 tablet (100 mg total) by mouth 2 (two) times daily. 02/06/24   Harden Jerona GAILS, MD  DULoxetine  (CYMBALTA ) 60 MG capsule Take 1 capsule (60 mg total) by mouth daily. Patient not taking: Reported on 02/05/2024 05/13/23   Newlin, Enobong, MD  empagliflozin  (JARDIANCE ) 10 MG TABS tablet Take 1 tablet (10 mg total) by mouth daily before breakfast. 11/17/23   Newlin, Enobong, MD  furosemide  (LASIX ) 20 MG tablet Take 1 tablet (20 mg total) by mouth daily as needed. Patient not taking: Reported on 02/05/2024 04/02/23   Laurence Locus, DO  insulin  glargine (LANTUS  SOLOSTAR) 100 UNIT/ML Solostar Pen  Inject 30 Units into the skin daily. 01/18/24   Trine Raynell Moder, MD  insulin  lispro (HUMALOG  KWIKPEN) 100 UNIT/ML KwikPen Inject 10 Units into the skin 3 (three) times daily. 01/18/24   Trine Raynell Moder, MD  lactulose  (CHRONULAC ) 10 GM/15ML solution Take 45 mLs (30 g total) by mouth as needed for mild constipation. 12/08/23   Beather Delon Gibson, PA  levETIRAcetam  (KEPPRA ) 500 MG tablet Take 1 tablet (500 mg total) by mouth 2 (two) times daily. 01/18/24 04/17/24  Trine Raynell Moder, MD  metFORMIN  (GLUCOPHAGE ) 500 MG tablet Take 2 tablets (1,000 mg total) by mouth 2 (two) times daily with a meal. 07/11/23   Delbert Clam, MD  metoprolol  succinate (TOPROL -XL) 25 MG 24 hr tablet Take 0.5 tablets (12.5 mg total) by mouth daily. 01/08/24   Drusilla Sabas RAMAN, MD  oxyCODONE -acetaminophen  (PERCOCET/ROXICET) 5-325 MG tablet Take 1 tablet by mouth every 4 (four) hours as needed. 02/06/24   Harden Jerona GAILS, MD  pantoprazole  (PROTONIX ) 40 MG tablet Take 1 tablet (40 mg total) by mouth 2 (two) times daily. Patient taking differently: Take 40 mg by mouth See admin instructions. Take 1 tablet (40mg ) by mouth daily. May take an additional tablet later in the day if needed for heartburn. 01/08/24   Drusilla Sabas RAMAN, MD  potassium chloride  SA (KLOR-CON  M) 20 MEQ tablet Take 40 mEq by mouth daily. Patient not taking: Reported on 02/05/2024    [provider]                                                                                 Vitals:   02/28/24 1935 02/28/24 2022 02/28/24 2200 02/28/24 2245  BP: (!) 149/83     Pulse:  (!) 110  99  Resp: 17   17  Temp:  98.9 F (37.2 C)    TempSrc:  SpO2:    100%  Weight:   64 kg   Height:   5' 6 (1.676 m)    Physical Exam Vitals reviewed.  Constitutional:      General: He is not in acute distress.    Appearance: He is not ill-appearing.  HENT:     Head: Normocephalic.  Eyes:     Extraocular Movements: Extraocular movements intact.   Cardiovascular:     Rate and Rhythm: Normal rate and regular rhythm.     Heart sounds: Normal heart sounds.  Pulmonary:     Breath sounds: Normal breath sounds.  Abdominal:     General: There is no distension.     Palpations: Abdomen is soft.     Tenderness: There is no abdominal tenderness.  Neurological:     General: No focal deficit present.     Mental Status: He is alert and oriented to person, place, and time.     Labs on Admission: I have personally reviewed following labs and imaging studies CBC: Recent Labs  Lab 02/28/24 1557 02/28/24 1627  WBC 6.3  --   NEUTROABS 4.0  --   HGB 6.4* 7.8*  HCT 20.8* 23.0*  MCV 76.2*  --   PLT 156  --    Basic Metabolic Panel: Recent Labs  Lab 02/28/24 1627 02/28/24 1759  NA 136 137  K 6.6* 4.1  CL  --  102  CO2  --  19*  GLUCOSE  --  557*  BUN  --  7  CREATININE  --  0.78  CALCIUM   --  9.3   GFR: Estimated Creatinine Clearance: 98.6 mL/min (by C-G formula based on SCr of 0.78 mg/dL). Liver Function Tests: Recent Labs  Lab 02/28/24 1759  AST 36  ALT 20  ALKPHOS 277*  BILITOT 2.2*  PROT 9.1*  ALBUMIN 2.8*   No results for input(s): LIPASE, AMYLASE in the last 168 hours. No results for input(s): AMMONIA in the last 168 hours. Recent Labs    12/08/23 1446 12/09/23 1322 12/18/23 2253 01/05/24 1404 01/06/24 0519 01/06/24 1339 01/07/24 0519 01/08/24 0510 01/18/24 0343 02/05/24 1339 02/28/24 1759  BUN 10 13 8 9 12   --  19 18 6 7 7   CREATININE 0.61 0.89 1.58* 0.55* 0.65 0.78 0.67 0.66 <0.30* 0.75 0.78    Estimated Creatinine Clearance: 98.6 mL/min (by C-G formula based on SCr of 0.78 mg/dL).   Recent Labs    12/08/23 1446 12/09/23 1322 12/18/23 2253 01/05/24 1404 01/06/24 0519 01/06/24 1339 01/07/24 0519 01/08/24 0510 01/18/24 0343 02/05/24 1339 02/28/24 1759  BUN 10 13 8 9 12   --  19 18 6 7 7   CREATININE 0.61 0.89 1.58* 0.55* 0.65 0.78 0.67 0.66 <0.30* 0.75 0.78  CO2 26 21* 17* 23 20*   --  20* 22 24 18* 19*   Cardiac Enzymes: No results for input(s): CKTOTAL, CKMB, CKMBINDEX, TROPONINI in the last 168 hours. BNP (last 3 results) No results for input(s): PROBNP in the last 8760 hours. HbA1C: No results for input(s): HGBA1C in the last 72 hours. CBG: No results for input(s): GLUCAP in the last 168 hours. Lipid Profile: No results for input(s): CHOL, HDL, LDLCALC, TRIG, CHOLHDL, LDLDIRECT in the last 72 hours. Thyroid  Function Tests: No results for input(s): TSH, T4TOTAL, FREET4, T3FREE, THYROIDAB in the last 72 hours. Anemia Panel: No results for input(s): VITAMINB12, FOLATE, FERRITIN, TIBC, IRON , RETICCTPCT in the last 72 hours. Urine analysis:    Component Value Date/Time   COLORURINE  STRAW (A) 02/28/2024 1557   APPEARANCEUR CLEAR 02/28/2024 1557   LABSPEC 1.023 02/28/2024 1557   PHURINE 6.0 02/28/2024 1557   GLUCOSEU >=500 (A) 02/28/2024 1557   HGBUR SMALL (A) 02/28/2024 1557   BILIRUBINUR NEGATIVE 02/28/2024 1557   KETONESUR NEGATIVE 02/28/2024 1557   PROTEINUR NEGATIVE 02/28/2024 1557   NITRITE NEGATIVE 02/28/2024 1557   LEUKOCYTESUR NEGATIVE 02/28/2024 1557   Radiological Exams on Admission: CT Head Wo Contrast Result Date: 02/28/2024 CLINICAL DATA:  Status post seizure. EXAM: CT HEAD WITHOUT CONTRAST TECHNIQUE: Contiguous axial images were obtained from the base of the skull through the vertex without intravenous contrast. RADIATION DOSE REDUCTION: This exam was performed according to the departmental dose-optimization program which includes automated exposure control, adjustment of the mA and/or kV according to patient size and/or use of iterative reconstruction technique. COMPARISON:  None Available. FINDINGS: Brain: There is generalized cerebral atrophy with widening of the extra-axial spaces and ventricular dilatation. There are areas of decreased attenuation within the white matter tracts of the  supratentorial brain, consistent with microvascular disease changes. Vascular: No hyperdense vessel or unexpected calcification. Skull: Normal. Negative for fracture or focal lesion. Sinuses/Orbits: No acute finding. Other: None. IMPRESSION: 1. Generalized cerebral atrophy with chronic white matter small vessel ischemic changes. 2. No acute intracranial abnormality. Electronically Signed   By: Suzen Dials M.D.   On: 02/28/2024 18:15   DG Ankle Complete Right Result Date: 02/28/2024 CLINICAL DATA:  Evaluate for osteomyelitis. Right ankle surgery 1 month ago. Hardware removal 2 weeks ago. There is a large knot on the medial ankle with an open wound on the lateral ankle. EXAM: RIGHT ANKLE - COMPLETE 3+ VIEW COMPARISON:  01/18/2024 FINDINGS: Healing fractures of the medial and lateral malleolus with residual fracture lines present possibly indicating nonunion. Interval removal of a previous lateral plate and screw. Two fixation screws still demonstrated in the medial malleolus. There is about 9 mm lateral displacement of the talus and of the distal fibular fragment with respect to the tibia. Displacement is new since the previous study which demonstrated near anatomic alignment. Soft tissue gas demonstrated over the medial and lateral malleolus suggesting soft tissue ulceration or infection. Periosteal reaction in bone loss demonstrated at the lateral malleolar fracture. This could indicate osteomyelitis or bone healing. Mild diffuse soft tissue swelling. IMPRESSION: 1. Previous medial and lateral malleolus fractures with residual fracture lines may indicate nonunion. 2. Interval removal of lateral plate and screw. 3. Interval lateral displacement of the talus and the distal lateral malleolar fragment with respect to the tibia. 4. Soft tissue ulceration and soft tissue gas. Underlying periosteal reaction in bone loss in the lateral malleolus could indicate osteomyelitis or bone healing. Electronically Signed    By: Elsie Gravely M.D.   On: 02/28/2024 17:11   DG Chest Port 1 View Result Date: 02/28/2024 CLINICAL DATA:  Sepsis. EXAM: PORTABLE CHEST 1 VIEW COMPARISON:  12/18/2023 FINDINGS: The heart size and mediastinal contours are within normal limits. Both lungs are clear. The visualized skeletal structures are unremarkable. IMPRESSION: No active disease. Electronically Signed   By: Norleen DELENA Kil M.D.   On: 02/28/2024 16:57   Data Reviewed: Relevant notes from primary care and specialist visits, past discharge summaries as available in EHR, including Care Everywhere . Prior diagnostic testing as pertinent to current admission diagnoses, Updated medications and problem lists for reconciliation .ED course, including vitals, labs, imaging, treatment and response to treatment,Triage notes, nursing and pharmacy notes and ED provider's notes.Notable results as noted in  HPI.Discussed case with EDMD/ ED APP/ or Specialty MD on call and as needed.  Assessment & Plan  >>AMS/ Suspected Seizure / Seizure disorder vs syncope and collapse or CVA: We will obtain prolactin and cpk/ aspiration/ fall and Seizure precaution.  Neuro check MRI brain noncontrast. Cont with home dose of keppra .    >>ABLA/ suspected GIB: With pt's underlying cirrhosis/ liver disease he is at high risk for variceal bleed.Appreciate GI consult. Pt started on PRBC in ED.     Latest Ref Rng & Units 02/28/2024    4:27 PM 02/28/2024    3:57 PM 02/04/2024    1:05 PM  CBC  WBC 4.0 - 10.5 K/uL  6.3  9.8   Hemoglobin 13.0 - 17.0 g/dL 7.8  6.4  9.9   Hematocrit 39.0 - 52.0 % 23.0  20.8  32.1   Platelets 150 - 400 K/uL  156  255   Occult / IV PPI if hb continues to drop we will consider octreotide .    >>RT ankle swelling pain/ presumed sepsis: Ct for OM . Will defer to Am team to consult orthopedics. Start IV ABX after blood cultures are collected.    >>Alcohol abuse/ Cirrhosis: Thiamine  500 mg x 1 CIWA precaution.  Mag pending. Ethanol  level pending.   >>AGMA: 2/2 to lactic acidosis 2/2 to hypovolemia / alcohol and possible sepsis.     >>A.FIB: Off AC due to GIB and ABLA. Regular on auscultation.  EKG ordered.      DVT prophylaxis:  SCD's   Consults:  GI  Advance Care Planning:    Code Status: Full Code   Family Communication:  None   Disposition Plan:  Homeless.   Severity of Illness: The appropriate patient status for this patient is INPATIENT. Inpatient status is judged to be reasonable and necessary in order to provide the required intensity of service to ensure the patient's safety. The patient's presenting symptoms, physical exam findings, and initial radiographic and laboratory data in the context of their chronic comorbidities is felt to place them at high risk for further clinical deterioration. Furthermore, it is not anticipated that the patient will be medically stable for discharge from the hospital within 2 midnights of admission.   * I certify that at the point of admission it is my clinical judgment that the patient will require inpatient hospital care spanning beyond 2 midnights from the point of admission due to high intensity of service, high risk for further deterioration and high frequency of surveillance required.*  Unresulted Labs (From admission, onward)     Start     Ordered   02/29/24 0500  Magnesium   Tomorrow morning,   R        02/28/24 2208   02/28/24 2315  Hemoglobin and hematocrit, blood  Once,   STAT        02/28/24 2314   02/28/24 2210  Culture, blood (Routine X 2) w Reflex to ID Panel  BLOOD CULTURE X 2,   R      02/28/24 2209   02/28/24 2209  C-reactive protein  Add-on,   AD        02/28/24 2208   02/28/24 2206  Magnesium   Add-on,   AD        02/28/24 2208   02/28/24 2138  Prolactin  Add-on,   AD        02/28/24 2137   02/28/24 2138  CK  Add-on,   AD  02/28/24 2137   02/28/24 2131  Ethanol  Add-on,   AD        02/28/24 2130   02/28/24 2118  Ammonia   Add-on,   AD        02/28/24 2118            Meds ordered this encounter  Medications   sodium chloride  0.9 % bolus 1,000 mL   0.9 %  sodium chloride  infusion (Manually program via Guardrails IV Fluids)   insulin  aspart (novoLOG ) injection 10 Units   DISCONTD: metoprolol  succinate (TOPROL -XL) 24 hr tablet 12.5 mg   DISCONTD: metFORMIN  (GLUCOPHAGE ) tablet 1,000 mg   levETIRAcetam  (KEPPRA ) tablet 500 mg   thiamine  (VITAMIN B1) 500 mg in sodium chloride  0.9 % 50 mL IVPB   pantoprazole  (PROTONIX ) injection 40 mg   acetaminophen  (TYLENOL ) tablet 1,000 mg   insulin  aspart (novoLOG ) injection 0-15 Units    Correction coverage::   Moderate (average weight, post-op)    CBG < 70::   implement hypoglycemia protocol    CBG 70 - 120::   0 units    CBG 121 - 150::   2 units    CBG 151 - 200::   3 units    CBG 201 - 250::   5 units    CBG 251 - 300::   8 units    CBG 301 - 350::   11 units    CBG 351 - 400::   15 units    CBG > 400:   call MD and obtain STAT lab verification   meropenem  (MERREM ) 1 g in sodium chloride  0.9 % 100 mL IVPB    Antibiotic Indication::   Other Indication (list below)    Other Indication::   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 has h/o seizures.   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 has h/o seizures.   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 has h/o seizures.   midazolam  (VERSED ) injection 1 mg   OR Linked Order Group    thiamine  (VITAMIN B1) tablet 100 mg    thiamine  (VITAMIN B1) injection 100 mg   folic acid  (FOLVITE ) tablet 1 mg   multivitamin with minerals tablet 1 tablet   OR Linked Order Group    ondansetron  (ZOFRAN ) tablet 4 mg    ondansetron  (ZOFRAN )  injection 4 mg     Orders Placed This Encounter  Procedures   Critical Care   Culture, blood (Routine X 2) w Reflex to ID Panel   DG Chest Port 1 View   CT Head Wo Contrast   DG Ankle Complete Right   MR BRAIN WO CONTRAST   CT ANKLE RIGHT WO CONTRAST   CBC with Differential   Protime-INR   Urinalysis, w/ Reflex to Culture (Infection Suspected) -Urine, Clean Catch   Beta-hydroxybutyric acid   Comprehensive metabolic panel with GFR   Ammonia   Ethanol   Prolactin   CK   Magnesium    Magnesium    C-reactive protein   Hemoglobin and hematocrit, blood   Diet clear liquid Room service appropriate? Yes; Fluid consistency: Thin   Document height and weight   Assess and Document Glasgow Coma Scale   Document  vital signs within 1-hour of fluid bolus completion.  Notify provider of abnormal vital signs despite fluid resuscitation.   Refer to Sidebar Report: Sepsis Bundle ED/IP   Notify provider for difficulties obtaining IV access   Initiate Carrier Fluid Protocol   Informed Consent Details: Physician/Practitioner Attestation; Transcribe to consent form and obtain patient signature   Neuro checks   Apply Diabetes Mellitus Care Plan   STAT CBG when hypoglycemia is suspected. If treated, recheck every 15 minutes after each treatment until CBG >/= 70 mg/dl   Refer to Hypoglycemia Protocol Sidebar Report for treatment of CBG < 70 mg/dl   No HS correction Insulin    Cardiac Monitoring - Continuous Indefinite   Vital signs every 6 hours X 48 hours, then per unit protocol   Refer to Sidebar Report for reference: ETOH Withdrawal Guidelines   Clinical Institute Withdrawal Assessent (CIWA)   If Ativan  given, reassess Clinical Institute Withdrawal Assessment (CIWA) with blood pressure and pulse rate within 1 hour of Ativan  administration   Notify Pharmacy to change IV Ativan  to PO if tolerating POs well.   Notify physician (specify)   SCDs   Patient has an active order for admit to  inpatient/place in observation   Initiate Adult Central Line Maintenance and Catheter Protocol for patients with central line (CVC, PICC, Port, Hemodialysis, Trialysis)   If patient diabetic or glucose greater than 140 notify physician for Sliding Scale Insulin  Orders   Intake and Output   Initiate CHG Protocol   Do not place and if present remove PureWick   Initiate Oral Care Protocol   RN may order General Admission PRN Orders utilizing General Admission PRN medications (through manage orders) for the following patient needs: allergy symptoms (Claritin), cold sores (Carmex), cough (Robitussin DM), eye irritation (Liquifilm Tears), hemorrhoids (Tucks), indigestion (Maalox), minor skin irritation (Hydrocortisone Cream), muscle pain Lucienne Gay), nose irritation (saline nasal spray) and sore throat (Chloraseptic spray).   Bed rest   Full code   Consult to hospitalist   vancomycin  per pharmacy consult   Meropenem  per pharmacy consult         Consult to Transition of Care Team   Pulse oximetry check with vital signs   Oxygen therapy Mode or (Route): Nasal cannula; Liters Per Minute: 2; Keep O2 saturation between: greater than 92 %   I-Stat Lactic Acid, ED   I-Stat venous blood gas, ([facility], [facility], [facility])   POC occult blood, ED   I-Stat CG4 Lactic Acid   ED EKG   EKG 12-Lead   Type and screen [facility]   Prepare RBC (crossmatch)   ABO/Rh   Insert peripheral IV X 1   Admit to Inpatient (patient's expected length of stay will be greater than 2 midnights or inpatient only procedure)   Seizure precautions    Author: Mario LULLA Blanch, MD 12 pm- 8 pm. [facility]. 02/28/2024 11:21 PM Please note for any communication after hours contact [facility] Assigned provider on call on Amion.

## 2024-02-28 NOTE — Hospital Course (Signed)
  alcoholic cirrhosis, pancreatitis, solitary kidney, necrotic cyst, history of alcoholic hepatitis in 11/2019, alcohol-related seizures, type 2 diabetes mellitus (A1c of 8.6), previously smoked (2 cig/day and quit 2022), paroxysmal A-fib.    Alcohol abuse. Anemia. Cirrhosis/ coagulopathy. DM II. Pancreatic lesion. Seizures.

## 2024-02-28 NOTE — ED Triage Notes (Signed)
 BIB [facility] from home. Found by neighbors, no recollection of passing out. Hx of seizures. Left arm swollen, left foot infected.  CBG 500+  124 112/62 98 RA 25 End tidal 27 RR

## 2024-02-28 NOTE — ED Provider Notes (Signed)
 [place] EMERGENCY DEPARTMENT AT [facility] Provider Note   CSN: [id number] Arrival date & time: 02/28/24  1545     Patient presents with: No chief complaint on file.   Gary Mitchell is a 53 y.o. male with a history of A-fib on Eliquis , seizures, pancreatitis, alcoholic hepatitis, diabetes, presenting to the ED with complaint of seizure.  Patient does not recall the event.  I witnesses had reported the patient had multiple seizures today.  Patient was somewhat confused for EMS reports his glucose levels over 500.  On arrival he appears awake and back to his normal self.  He reports a mild headache.  He separately reports that he has been having pain in his right ankle where he had an orthopedic revision performed in the hospital.  I reviewed external records and at the end of July, the patient underwent an operation July 30 with Dr. 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   HPI     Prior to Admission medications   Medication Sig Start Date End Date Taking? Authorizing Provider  acetaminophen  (TYLENOL ) 500 MG tablet Take 1,000 mg by mouth 2 (two) times daily as needed for moderate pain (pain score 4-6), fever or headache.    [provider]  amoxicillin -clavulanate (AUGMENTIN ) 875-125 MG tablet Take 1 tablet by mouth 2 (two) times daily. 02/08/24   Harden Jerona GAILS, MD  apixaban  (ELIQUIS ) 2.5 MG TABS tablet Take 1 tablet (2.5 mg total) by mouth 2 (two) times daily for 28 days. Patient not taking: Reported on 02/05/2024 01/08/24 02/05/24  Drusilla Sabas RAMAN, MD  atorvastatin  (LIPITOR) 20 MG tablet Take 1 tablet (20 mg total) by mouth daily. Patient not taking: Reported on 02/05/2024 04/02/23   Laurence Locus, DO  Continuous Glucose Sensor (FREESTYLE LIBRE 3 PLUS SENSOR) MISC Change sensor every 15 days. Use to check blood sugar continuously. 12/15/23   Newlin, Enobong, MD  doxycycline  (VIBRA -TABS) 100 MG tablet Take 1 tablet  (100 mg total) by mouth 2 (two) times daily. 02/06/24   Harden Jerona GAILS, MD  DULoxetine  (CYMBALTA ) 60 MG capsule Take 1 capsule (60 mg total) by mouth daily. Patient not taking: Reported on 02/05/2024 05/13/23   Newlin, Enobong, MD  empagliflozin  (JARDIANCE ) 10 MG TABS tablet Take 1 tablet (10 mg total) by mouth daily before breakfast. 11/17/23   Newlin, Enobong, MD  furosemide  (LASIX ) 20 MG tablet Take 1 tablet (20 mg total) by mouth daily as needed. Patient not taking: Reported on 02/05/2024 04/02/23   Laurence Locus, DO  insulin  glargine (LANTUS  SOLOSTAR) 100 UNIT/ML Solostar Pen Inject 30 Units into the skin daily. 01/18/24   Trine Raynell Moder, MD  insulin  lispro (HUMALOG  KWIKPEN) 100 UNIT/ML KwikPen Inject 10 Units into the skin 3 (three) times daily. 01/18/24   Trine Raynell Moder, MD  lactulose  (CHRONULAC ) 10 GM/15ML solution Take 45 mLs (30 g total) by mouth as needed for mild constipation. 12/08/23   Beather Delon Gibson, PA  levETIRAcetam  (KEPPRA ) 500 MG tablet Take 1 tablet (500 mg total) by mouth 2 (two) times daily. 01/18/24 04/17/24  Trine Raynell Moder, MD  metFORMIN  (GLUCOPHAGE ) 500 MG tablet Take 2 tablets (1,000 mg total) by mouth 2 (two) times daily with a meal. 07/11/23   Delbert Clam, MD  metoprolol  succinate (TOPROL -XL) 25 MG 24 hr tablet Take 0.5 tablets (12.5 mg total) by mouth daily. 01/08/24   Drusilla Sabas RAMAN, MD  oxyCODONE -acetaminophen  (PERCOCET/ROXICET) 5-325 MG tablet Take  1 tablet by mouth every 4 (four) hours as needed. 02/06/24   Harden Jerona GAILS, MD  pantoprazole  (PROTONIX ) 40 MG tablet Take 1 tablet (40 mg total) by mouth 2 (two) times daily. Patient taking differently: Take 40 mg by mouth See admin instructions. Take 1 tablet (40mg ) by mouth daily. May take an additional tablet later in the day if needed for heartburn. 01/08/24   Drusilla Sabas RAMAN, MD  potassium chloride  SA (KLOR-CON  M) 20 MEQ tablet Take 40 mEq by mouth daily. Patient not taking: Reported on 02/05/2024    [provider]    Allergies: Nsaids and Iohexol     Review of Systems  Updated Vital Signs BP (!) 149/83   Pulse (!) 110   Temp 98.9 F (37.2 C)   Resp 17   SpO2 100%   Physical Exam Constitutional:      General: He is not in acute distress. HENT:     Head: Normocephalic and atraumatic.  Eyes:     Conjunctiva/sclera: Conjunctivae normal.     Pupils: Pupils are equal, round, and reactive to light.  Cardiovascular:     Rate and Rhythm: Regular rhythm. Tachycardia present.  Pulmonary:     Effort: Pulmonary effort is normal. No respiratory distress.  Abdominal:     General: There is no distension.     Tenderness: There is no abdominal tenderness.  Musculoskeletal:     Comments: Swollen and malodorous right ankle, wrapped in masking tape, no active drainage or crepitus  Skin:    General: Skin is warm and dry.  Neurological:     General: No focal deficit present.     Mental Status: He is alert. Mental status is at baseline.  Psychiatric:        Mood and Affect: Mood normal.        Behavior: Behavior normal.     (all labs ordered are listed, but only abnormal results are displayed) Labs Reviewed  CBC WITH DIFFERENTIAL/PLATELET - Abnormal; Notable for the following components:      Result Value   RBC 2.73 (*)    Hemoglobin 6.4 (*)    HCT 20.8 (*)    MCV 76.2 (*)    MCH 23.4 (*)    RDW 25.2 (*)    Basophils Absolute 0.2 (*)    All other components within normal limits  PROTIME-INR - Abnormal; Notable for the following components:   Prothrombin Time 17.5 (*)    INR 1.4 (*)    All other components within normal limits  URINALYSIS, W/ REFLEX TO CULTURE (INFECTION SUSPECTED) - Abnormal; Notable for the following components:   Color, Urine STRAW (*)    Glucose, UA >=500 (*)    Hgb urine dipstick SMALL (*)    Bacteria, UA RARE (*)    All other components within normal limits  COMPREHENSIVE METABOLIC PANEL WITH GFR - Abnormal; Notable for the following components:    CO2 19 (*)    Glucose, Bld 557 (*)    Total Protein 9.1 (*)    Albumin 2.8 (*)    Alkaline Phosphatase 277 (*)    Total Bilirubin 2.2 (*)    Anion gap 16 (*)    All other components within normal limits  I-STAT CG4 LACTIC ACID, ED - Abnormal; Notable for the following components:   Lactic Acid, Venous 7.3 (*)    All other components within normal limits  I-STAT VENOUS BLOOD GAS, ED - Abnormal; Notable for the following components:   pCO2,  Ven 33.0 (*)    pO2, Ven 143 (*)    TCO2 21 (*)    Acid-base deficit 4.0 (*)    Potassium 6.6 (*)    Calcium , Ion 1.07 (*)    HCT 23.0 (*)    Hemoglobin 7.8 (*)    All other components within normal limits  I-STAT CG4 LACTIC ACID, ED - Abnormal; Notable for the following components:   Lactic Acid, Venous 4.6 (*)    All other components within normal limits  BETA-HYDROXYBUTYRIC ACID  POC OCCULT BLOOD, ED  TYPE AND SCREEN  PREPARE RBC (CROSSMATCH)  ABO/RH    EKG: None  Radiology: CT Head Wo Contrast Result Date: 02/28/2024 CLINICAL DATA:  Status post seizure. EXAM: CT HEAD WITHOUT CONTRAST TECHNIQUE: Contiguous axial images were obtained from the base of the skull through the vertex without intravenous contrast. RADIATION DOSE REDUCTION: This exam was performed according to the departmental dose-optimization program which includes automated exposure control, adjustment of the mA and/or kV according to patient size and/or use of iterative reconstruction technique. COMPARISON:  None Available. FINDINGS: Brain: There is generalized cerebral atrophy with widening of the extra-axial spaces and ventricular dilatation. There are areas of decreased attenuation within the white matter tracts of the supratentorial brain, consistent with microvascular disease changes. Vascular: No hyperdense vessel or unexpected calcification. Skull: Normal. Negative for fracture or focal lesion. Sinuses/Orbits: No acute finding. Other: None. IMPRESSION: 1. Generalized  cerebral atrophy with chronic white matter small vessel ischemic changes. 2. No acute intracranial abnormality. Electronically Signed   By: Suzen Dials M.D.   On: 02/28/2024 18:15   DG Ankle Complete Right Result Date: 02/28/2024 CLINICAL DATA:  Evaluate for osteomyelitis. Right ankle surgery 1 month ago. Hardware removal 2 weeks ago. There is a large knot on the medial ankle with an open wound on the lateral ankle. EXAM: RIGHT ANKLE - COMPLETE 3+ VIEW COMPARISON:  01/18/2024 FINDINGS: Healing fractures of the medial and lateral malleolus with residual fracture lines present possibly indicating nonunion. Interval removal of a previous lateral plate and screw. Two fixation screws still demonstrated in the medial malleolus. There is about 9 mm lateral displacement of the talus and of the distal fibular fragment with respect to the tibia. Displacement is new since the previous study which demonstrated near anatomic alignment. Soft tissue gas demonstrated over the medial and lateral malleolus suggesting soft tissue ulceration or infection. Periosteal reaction in bone loss demonstrated at the lateral malleolar fracture. This could indicate osteomyelitis or bone healing. Mild diffuse soft tissue swelling. IMPRESSION: 1. Previous medial and lateral malleolus fractures with residual fracture lines may indicate nonunion. 2. Interval removal of lateral plate and screw. 3. Interval lateral displacement of the talus and the distal lateral malleolar fragment with respect to the tibia. 4. Soft tissue ulceration and soft tissue gas. Underlying periosteal reaction in bone loss in the lateral malleolus could indicate osteomyelitis or bone healing. Electronically Signed   By: Elsie Gravely M.D.   On: 02/28/2024 17:11   DG Chest Port 1 View Result Date: 02/28/2024 CLINICAL DATA:  Sepsis. EXAM: PORTABLE CHEST 1 VIEW COMPARISON:  12/18/2023 FINDINGS: The heart size and mediastinal contours are within normal limits. Both  lungs are clear. The visualized skeletal structures are unremarkable. IMPRESSION: No active disease. Electronically Signed   By: Norleen DELENA Kil M.D.   On: 02/28/2024 16:57     .Critical Care  Performed by: Cottie Donnice PARAS, MD Authorized by: Cottie Donnice PARAS, MD   Critical care provider  statement:    Critical care time (minutes):  30   Critical care time was exclusive of:  Separately billable procedures and treating other patients   Critical care was time spent personally by me on the following activities:  Ordering and performing treatments and interventions, ordering and review of laboratory studies, ordering and review of radiographic studies, pulse oximetry, review of old charts, examination of patient and evaluation of patient's response to treatment   Care discussed with: admitting provider   Comments:     Symptomatic anemia management with blood transfusion    Medications Ordered in the ED  0.9 %  sodium chloride  infusion (Manually program via Guardrails IV Fluids) (has no administration in time range)  metoprolol  succinate (TOPROL -XL) 24 hr tablet 12.5 mg (has no administration in time range)  metFORMIN  (GLUCOPHAGE ) tablet 1,000 mg (has no administration in time range)  levETIRAcetam  (KEPPRA ) tablet 500 mg (has no administration in time range)  sodium chloride  0.9 % bolus 1,000 mL (1,000 mLs Intravenous New Bag/Given 02/28/24 2022)  insulin  aspart (novoLOG ) injection 10 Units (10 Units Subcutaneous Given 02/28/24 2030)    Clinical Course as of 02/28/24 2118  Sat Feb 28, 2024  1556 Temp 14F [MT]  1557 Glucose reportedly >500 for EMS [MT]  1700 Discussed low HGB with patient; he denies dark stools but we'll check hemoccult.  He does report some exertional lightheadedness the past few days. [MT]  1705 Lactate elevated may be related to seizures. WBC normal; afebrile; no clear evidence this is sepsis [MT]  1706 WBC: 6.3 [MT]  1719 Rectal exam - no frank melena [MT]  1901 No  ketones in urine. Hyperglycemic without clear evidence of DKA.  No acidosis on VBG.  We'll continue with IV fluids and some insulin  [MT]  1927 RN advised pt needs IV fluids ordered, needs repeat lactate afterward. HR remains 110 bpm, BP normal. [MT]  1933 Fecal Occult Blood, POC: NEGATIVE [MT]  2056 Repeat lactate is trending down after fluids, I suggested that initial elevated lactate was most likely related to patient's seizure. [MT]  2118 Patient admitted to hospitalist Dr Tobie [MT]    Clinical Course User Index [MT] Cottie Donnice PARAS, MD                                 Medical Decision Making Amount and/or Complexity of Data Reviewed Labs: ordered. Decision-making details documented in ED Course. Radiology: ordered.  Risk Prescription drug management. Decision regarding hospitalization.   This patient presents to the ED with concern for right ankle pain, seizures. This involves an extensive number of treatment options, and is a complaint that carries with it a high risk of complications and morbidity.  The differential diagnosis includes metabolic derangement including hyperglycemic crisis versus DKA versus infection versus other  Patient reports compliance with his seizure medications and all his medications at home.  Question of possible head trauma on Eliquis .  Will pursue CT imaging of the head.  No red flag symptoms for intracranial hemorrhage otherwise.  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  The pertinent results include: Initial lactate elevated.  Repeat lactate after fluids improving to 4.6.  Patient is hyperglycemic  glucose 557, very minor anion gap of 16.  No  acidosis on venous gas.  Bicarb normal.  UA without clear evidence of infection.  Patient has a microcytic anemia with hemoglobin 6.4.  No leukocytosis.  I ordered imaging studies including CT head, x-ray of the chest and right ankle I independently visualized and interpreted imaging which showed soft tissue swelling around the right ankle near surgical site, no other acute emergent findings I agree with the radiologist interpretation  The patient was maintained on a cardiac monitor.  I personally viewed and interpreted the cardiac monitored which showed an underlying rhythm of: Tachycardia  Per my interpretation the patient's ECG shows tachycardia  I ordered medication including the blood cell transfusion for symptomatic anemia, IV fluids for elevated lactic; home Keppra  and metoprolol  dosing  I have reviewed the patients home medicines and have made adjustments as needed  Test Considered: Lower suspicion for acute meningitis, acute PE, intra-abdominal surgical emergency.  No indication for CT imaging at this time  After the interventions noted above, I reevaluated the patient and found that they have: improved  Patient's Hemoccult is negative.  No reported dark stools.  Unclear etiology of his anemia but this may still well be a GI bleed particularly given that he is on Eliquis .  We will hold the Eliquis  for now, admit him to the hospitalist.  He is stable at this time of admission.  May benefit from GI consult inpatient, but would be stable for transfusion at this time.    Disposition:  After consideration of the diagnostic results and the patient's response to treatment, I feel that the patient would benefit from medical admission.      Final diagnoses:  Hyperglycemia  Seizure (HCC)  Iron  deficiency anemia, unspecified iron  deficiency anemia type    ED Discharge Orders     None          Cottie Donnice PARAS, MD 02/28/24 2118

## 2024-02-28 NOTE — Progress Notes (Signed)
 Pharmacy Antibiotic Note  Gary Mitchell is a 53 y.o. male admitted on 02/28/2024 with  hardware infection .  Pharmacy has been consulted for vancomycin  and meropenem  dosing.  Plan: Vancomycin  1500mg  IV x1 then 1000mg  IV q12 hours (eAUC 491, scr 0.78, Vd 0.72)  Meropenem  G IV q8 hours Follow clinical progression, cultures and order level as indicated   Height: 5' 6 (167.6 cm) Weight: 64 kg (141 lb 1.5 oz) IBW/kg (Calculated) : 63.8  Temp (24hrs), Avg:99 F (37.2 C), Min:98.9 F (37.2 C), Max:99.1 F (37.3 C)  Recent Labs  Lab 02/28/24 1557 02/28/24 1627 02/28/24 1759 02/28/24 2035  WBC 6.3  --   --   --   CREATININE  --   --  0.78  --   LATICACIDVEN  --  7.3*  --  4.6*    Estimated Creatinine Clearance: 98.6 mL/min (by C-G formula based on SCr of 0.78 mg/dL).    Allergies  Allergen Reactions   Iohexol  Other (See Comments)    Unknown reaction at 51 days old Mom at bedside reported patient was given injections of iohexol  in foot and resulted in hole in foot or foot got infected   Nsaids Other (See Comments)    Patient said he was told he could take Tylenol  ONLY.    Thank you for allowing pharmacy to be a part of this patient's care.  Koren CROME Carrye Goller 02/28/2024 10:42 PM

## 2024-02-28 NOTE — ED Notes (Signed)
Pt peri care performed.

## 2024-02-29 ENCOUNTER — Inpatient Hospital Stay (HOSPITAL_COMMUNITY)

## 2024-02-29 ENCOUNTER — Encounter (HOSPITAL_COMMUNITY): Payer: Self-pay | Admitting: Internal Medicine

## 2024-02-29 ENCOUNTER — Other Ambulatory Visit: Payer: Self-pay

## 2024-02-29 DIAGNOSIS — R4182 Altered mental status, unspecified: Secondary | ICD-10-CM | POA: Diagnosis not present

## 2024-02-29 DIAGNOSIS — F1090 Alcohol use, unspecified, uncomplicated: Secondary | ICD-10-CM

## 2024-02-29 DIAGNOSIS — D509 Iron deficiency anemia, unspecified: Secondary | ICD-10-CM

## 2024-02-29 DIAGNOSIS — K7030 Alcoholic cirrhosis of liver without ascites: Secondary | ICD-10-CM

## 2024-02-29 DIAGNOSIS — S0231XA Fracture of orbital floor, right side, initial encounter for closed fracture: Secondary | ICD-10-CM | POA: Diagnosis not present

## 2024-02-29 DIAGNOSIS — R9089 Other abnormal findings on diagnostic imaging of central nervous system: Secondary | ICD-10-CM | POA: Diagnosis not present

## 2024-02-29 DIAGNOSIS — R569 Unspecified convulsions: Secondary | ICD-10-CM | POA: Diagnosis not present

## 2024-02-29 LAB — COMPREHENSIVE METABOLIC PANEL WITH GFR
ALT: 18 U/L (ref 0–44)
AST: 36 U/L (ref 15–41)
Albumin: 2.2 g/dL — ABNORMAL LOW (ref 3.5–5.0)
Alkaline Phosphatase: 225 U/L — ABNORMAL HIGH (ref 38–126)
Anion gap: 8 (ref 5–15)
BUN: <5 mg/dL — ABNORMAL LOW (ref 6–20)
CO2: 25 mmol/L (ref 22–32)
Calcium: 8.4 mg/dL — ABNORMAL LOW (ref 8.9–10.3)
Chloride: 105 mmol/L (ref 98–111)
Creatinine, Ser: 0.5 mg/dL — ABNORMAL LOW (ref 0.61–1.24)
GFR, Estimated: >60 mL/min (ref >60)
Glucose, Bld: 240 mg/dL — ABNORMAL HIGH (ref 70–99)
Potassium: 3.5 mmol/L (ref 3.5–5.1)
Sodium: 138 mmol/L (ref 135–145)
Total Bilirubin: 2.6 mg/dL — ABNORMAL HIGH (ref 0.0–1.2)
Total Protein: 7.8 g/dL (ref 6.5–8.1)

## 2024-02-29 LAB — IRON AND TIBC
Iron: 135 ug/dL (ref 45–182)
Saturation Ratios: 36 % (ref 17.9–39.5)
TIBC: 372 ug/dL (ref 250–450)
UIBC: 237 ug/dL

## 2024-02-29 LAB — I-STAT CG4 LACTIC ACID, ED: Lactic Acid, Venous: 3 mmol/L (ref 0.5–1.9)

## 2024-02-29 LAB — CBC WITH DIFFERENTIAL/PLATELET
Basophils Absolute: 0 K/uL (ref 0.0–0.1)
Basophils Relative: 0 %
Eosinophils Absolute: 0.1 K/uL (ref 0.0–0.5)
Eosinophils Relative: 1 %
HCT: 23.6 % — ABNORMAL LOW (ref 39.0–52.0)
Hemoglobin: 7.5 g/dL — ABNORMAL LOW (ref 13.0–17.0)
Lymphocytes Relative: 16 %
Lymphs Abs: 0.9 K/uL (ref 0.7–4.0)
MCH: 24 pg — ABNORMAL LOW (ref 26.0–34.0)
MCHC: 31.8 g/dL (ref 30.0–36.0)
MCV: 75.4 fL — ABNORMAL LOW (ref 80.0–100.0)
Monocytes Absolute: 0.5 K/uL (ref 0.1–1.0)
Monocytes Relative: 8 %
Neutro Abs: 4.4 K/uL (ref 1.7–7.7)
Neutrophils Relative %: 75 %
Platelets: 117 K/uL — ABNORMAL LOW (ref 150–400)
RBC: 3.13 MIL/uL — ABNORMAL LOW (ref 4.22–5.81)
RDW: 23.7 % — ABNORMAL HIGH (ref 11.5–15.5)
WBC: 5.8 K/uL (ref 4.0–10.5)
nRBC: 0 % (ref 0.0–0.2)

## 2024-02-29 LAB — HEMOGLOBIN AND HEMATOCRIT, BLOOD
HCT: 20.6 % — ABNORMAL LOW (ref 39.0–52.0)
HCT: 23.6 % — ABNORMAL LOW (ref 39.0–52.0)
Hemoglobin: 6.2 g/dL — CL (ref 13.0–17.0)
Hemoglobin: 7.5 g/dL — ABNORMAL LOW (ref 13.0–17.0)

## 2024-02-29 LAB — AMMONIA: Ammonia: 90 umol/L — ABNORMAL HIGH (ref 9–35)

## 2024-02-29 LAB — GLUCOSE, CAPILLARY
Glucose-Capillary: 249 mg/dL — ABNORMAL HIGH (ref 70–99)
Glucose-Capillary: 269 mg/dL — ABNORMAL HIGH (ref 70–99)
Glucose-Capillary: 215 mg/dL — ABNORMAL HIGH (ref 70–99)
Glucose-Capillary: 311 mg/dL — ABNORMAL HIGH (ref 70–99)

## 2024-02-29 LAB — MAGNESIUM: Magnesium: 1.1 mg/dL — ABNORMAL LOW (ref 1.7–2.4)

## 2024-02-29 LAB — ETHANOL: Alcohol, Ethyl (B): 60 mg/dL — ABNORMAL HIGH (ref <15)

## 2024-02-29 LAB — FERRITIN: Ferritin: 15 ng/mL — ABNORMAL LOW (ref 24–336)

## 2024-02-29 MED ORDER — THIAMINE MONONITRATE 100 MG PO TABS: 100.0000 mg | ORAL_TABLET | Freq: Every day | ORAL | Status: DC

## 2024-02-29 MED ORDER — LEVETIRACETAM 500 MG PO TABS
500.0000 mg | ORAL_TABLET | Freq: Once | ORAL | Status: AC
  Administered 2024-02-29: 500 mg via ORAL
  Filled 2024-02-29: qty 1

## 2024-02-29 MED ORDER — LORAZEPAM 1 MG PO TABS: 0.0000 mg | ORAL_TABLET | Freq: Four times a day (QID) | ORAL | Status: AC

## 2024-02-29 MED ORDER — LACTULOSE 10 GM/15ML PO SOLN
20.0000 g | Freq: Every day | ORAL | Status: DC
  Filled 2024-02-29 (×4): qty 30

## 2024-02-29 MED ORDER — VANCOMYCIN HCL IN DEXTROSE 1-5 GM/200ML-% IV SOLN
1000.0000 mg | Freq: Two times a day (BID) | INTRAVENOUS | Status: DC
  Administered 2024-02-29 – 2024-03-03 (×6): 1000 mg via INTRAVENOUS
  Filled 2024-02-29 (×7): qty 200

## 2024-02-29 MED ORDER — LEVETIRACETAM 500 MG PO TABS
1000.0000 mg | ORAL_TABLET | Freq: Two times a day (BID) | ORAL | Status: DC
  Administered 2024-02-29 – 2024-03-04 (×8): 1000 mg via ORAL
  Filled 2024-02-29 (×8): qty 2

## 2024-02-29 MED ORDER — LORAZEPAM 1 MG PO TABS
0.0000 mg | ORAL_TABLET | Freq: Two times a day (BID) | ORAL | Status: AC
  Administered 2024-03-03: 1 mg via ORAL
  Filled 2024-02-29: qty 1

## 2024-02-29 MED ORDER — LACTULOSE 10 GM/15ML PO SOLN
20.0000 g | Freq: Three times a day (TID) | ORAL | Status: DC
  Filled 2024-02-29: qty 30

## 2024-02-29 MED ORDER — THIAMINE HCL 100 MG/ML IJ SOLN: 100.0000 mg | Freq: Every day | INTRAMUSCULAR | Status: DC

## 2024-02-29 MED ORDER — FOLIC ACID 1 MG PO TABS: 1.0000 mg | ORAL_TABLET | Freq: Every day | ORAL | Status: DC

## 2024-02-29 MED ORDER — MAGNESIUM SULFATE 4 GM/100ML IV SOLN
4.0000 g | Freq: Once | INTRAVENOUS | Status: AC
  Administered 2024-02-29: 4 g via INTRAVENOUS
  Filled 2024-02-29: qty 100

## 2024-02-29 MED ORDER — SODIUM CHLORIDE 0.9 % IV SOLN
3.0000 g | Freq: Four times a day (QID) | INTRAVENOUS | Status: DC
  Administered 2024-02-29 – 2024-03-03 (×12): 3 g via INTRAVENOUS
  Filled 2024-02-29 (×12): qty 8

## 2024-02-29 MED ORDER — INSULIN ASPART 100 UNIT/ML IJ SOLN
5.0000 [IU] | Freq: Once | INTRAMUSCULAR | Status: AC
  Administered 2024-02-29: 5 [IU] via SUBCUTANEOUS

## 2024-02-29 MED ORDER — LORAZEPAM 1 MG PO TABS
1.0000 mg | ORAL_TABLET | ORAL | Status: AC | PRN
  Administered 2024-03-03: 2 mg via ORAL
  Filled 2024-02-29: qty 2

## 2024-02-29 MED ORDER — LORAZEPAM 2 MG/ML IJ SOLN: 1.0000 mg | INTRAMUSCULAR | Status: AC | PRN

## 2024-02-29 MED ORDER — ADULT MULTIVITAMIN W/MINERALS CH: 1.0000 | ORAL_TABLET | Freq: Every day | ORAL | Status: DC
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
## 2024-02-29 NOTE — Anesthesia Preprocedure Evaluation (Signed)
 Anesthesia Evaluation  Patient identified by MRN, date of birth, ID band Patient awake    Reviewed: Allergy & Precautions, NPO status , Patient's Chart, lab work & pertinent test results  History of Anesthesia Complications Negative for: history of anesthetic complications  Airway Mallampati: II  TM Distance: >3 FB Neck ROM: Full    Dental no notable dental hx. (+) Teeth Intact, Dental Advisory Given   Pulmonary former smoker   Pulmonary exam normal breath sounds clear to auscultation       Cardiovascular (-) angina (-) Past MI Normal cardiovascular exam+ dysrhythmias (on eliquis ) Atrial Fibrillation  Rhythm:Regular Rate:Normal  09/2022 TTE  1. Left ventricular ejection fraction, by estimation, is 55 to 60%. The  left ventricle has normal function. The left ventricle has no regional  wall motion abnormalities. Left ventricular diastolic parameters were  normal. The average left ventricular  global longitudinal strain is -17.9 %. The global longitudinal strain is  normal.   2. Right ventricular systolic function is normal. The right ventricular  size is normal. There is normal pulmonary artery systolic pressure.   3. Left atrial size was severely dilated.   4. Right atrial size was mildly dilated.   5. The mitral valve is normal in structure. Trivial mitral valve  regurgitation. No evidence of mitral stenosis.   6. The aortic valve is tricuspid. Aortic valve regurgitation is not  visualized. No aortic stenosis is present.   7. The inferior vena cava is normal in size with greater than 50%  respiratory variability, suggesting right atrial pressure of 3 mmHg.      Neuro/Psych Seizures -,  PSYCHIATRIC DISORDERS Anxiety Depression    CVA    GI/Hepatic ,GERD  Medicated and Controlled,,(+)     substance abuse  alcohol use, Hepatitis -Lab Results      Component                Value               Date                      ALT                       18                  02/29/2024                AST                      36                  02/29/2024                ALKPHOS                  225 (H)             02/29/2024                BILITOT                  2.6 (H)             02/29/2024              Endo/Other  diabetes, Type 2, Insulin  Dependent    Renal/GU Lab Results      Component  Value               Date                          K                        3.5                 02/29/2024                CO2                      25                  02/29/2024                  CREATININE               0.50 (L)            02/29/2024                GFRNONAA                 >60                 02/29/2024               ALBUMIN                  2.2 (L)             02/29/2024                GLUCOSE                  240 (H)             02/29/2024                Musculoskeletal   Abdominal   Peds  Hematology  (+) Blood dyscrasia, anemia Lab Results      Component                Value               Date                      WBC                      5.8                 02/29/2024                HGB                      7.5 (L)             02/29/2024                HCT                      23.6 (L)            02/29/2024                MCV                      75.4 (  L)            02/29/2024                PLT                      117 (L)             02/29/2024              Anesthesia Other Findings All: lohexol, NSAIDs  Reproductive/Obstetrics                              Anesthesia Physical Anesthesia Plan  ASA: 4  Anesthesia Plan: MAC   Post-op Pain Management: Minimal or no pain anticipated and Precedex    Induction:   PONV Risk Score and Plan: 1 and Propofol  infusion and Treatment may vary due to age or medical condition  Airway Management Planned: Nasal Cannula and Natural Airway  Additional Equipment: None  Intra-op Plan:   Post-operative Plan:  Extubation in OR  Informed Consent: I have reviewed the patients History and Physical, chart, labs and discussed the procedure including the risks, benefits and alternatives for the proposed anesthesia with the patient or authorized representative who has indicated his/her understanding and acceptance.     Dental advisory given  Plan Discussed with: CRNA and Surgeon  Anesthesia Plan Comments:          Anesthesia Quick Evaluation
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
## 2024-02-29 NOTE — ED Notes (Signed)
 Called [facility]NP to inform pt will be transported upstairs.

## 2024-02-29 NOTE — Progress Notes (Signed)
 Routine EEG up and running. Results pending

## 2024-02-29 NOTE — Procedures (Signed)
 Patient Name: Gary Mitchell  MRN: [id number]  Epilepsy Attending: Arlin MALVA Krebs  Referring Physician/Provider: Perri DELENA Meliton Mickey., MD  Date: 02/29/2024 Duration: 24.44 mins  Patient history: 53yo M with ams. EEG to evaluate for seizure  Level of alertness: Awake  AEDs during EEG study: LEV, versed   Technical aspects: This EEG study was done with scalp electrodes positioned according to the 10-20 International system of electrode placement. Electrical activity was reviewed with band pass filter of 1-70Hz , sensitivity of 7 uV/mm, display speed of 22mm/sec with a 60Hz  notched filter applied as appropriate. EEG data were recorded continuously and digitally stored.  Video monitoring was available and reviewed as appropriate.  Description: The posterior dominant rhythm consists of 8 Hz activity of moderate voltage (25-35 uV) seen predominantly in posterior head regions, symmetric and reactive to eye opening and eye closing. Hyperventilation and photic stimulation were not performed.     IMPRESSION: This study is within normal limits. No seizures or epileptiform discharges were seen throughout the recording.  Annalicia Renfrew O Kristen Bushway

## 2024-02-29 NOTE — Progress Notes (Addendum)
 PROGRESS NOTE    Gary Mitchell  FMW:[id number] DOB: [birth year] DOA: 02/28/2024 PCP: Delbert Clam, MD  Chief Complaint  Patient presents with   Hyperglycemia   Wound Infection    Brief Narrative:   53 yo with hx etoh abuse, cirrhosis, atrial fib on eliquis , T2DM and multiple other medical issues with recent closed R ankle fracture s/p ORIF 01/06/2024.  He was admitted by orthopedics with infected hardware 7/30 and had surgery per ortho with removal of hardware.  He was discharged on 8/1.  He's returned in the setting of being found down after Kalib Bhagat suspected seizure and right lower extremity swelling/infection.    Assessment & Plan:   Principal Problem:   AMS (altered mental status)  Right Lower Extremity Infection  Infected Hardware  Septic Arthritis  Osteomyelitis  Myositis  CT with interval worsening since 7/113/2025 - erosive tibiotalar arthropathy and joint collapse.  Interval bone density loss in distal tibia, talus, and lateral malleolar fracture fragment.  Increased lateral and slight cephalad displacement of the lateral malleolar fracture fragment with increased lateral subluxation of the talus.  Medial malleolar screws with perihardware lucency c/w loosening (both backed out about 6 mm).  Metallic transsyndesmotic cable fixation plate remains medially and has increasingly separated from the tibia.  Widespread periosteal amorphous/dystrophic calcifications around the distal tibia and fibula (most thought to be periostitis related to osteomyelitis).  Diffuse edema in the distal foreleg muscles.  Soft tissue ulceration laterally at level of distal fibular fracture.  Shallow surface ulceration medially at the level of the medial malleolar fracture.  (See full report) S/p surgery 7/30 with operative cultures showing MRSA and VRE  Blood cultures pending Dr. Harden to see 8/25 am Continue vancomycin  and unasyn    Seizures Acute Metabolic Encephalopathy Found down, apparently per EDP,  eye witnesses noted multiple seizures Reported confusion for EMS, but back to normal when seen by EDP  MRI brain with T1 hyperintensity in the bilateral globus pallidus (due to hepatic insufficiency, cirrhosis) - no acute abnormality Follow EEG Continue his home keppra  - will consult neurology in setting of his breakthrough seizure -- ? Related to his hyperglycemia at presentation vs the RLE infection vs etoh related? PT/OT  Anemia Hb 6.4 at presentation (was 9.9 at the end of July) No reported GI bleeding, but notable hx cirrhosis S/p 1 unit pRBC Will follow iron , b12, folate, ferritin  Atrial Fibrillation Eliquis  currently on hold   Etoh Abuse [place]   Cirrhosis Elevated ammonia, but alert and oriented - no asterixis Will continue daily lactulose   Anion Gap Metabolic Acidosis Trend, mild   Hypomagnesemia Replace and follow     DVT prophylaxis: SCD Code Status: full Family Communication: none Disposition:   Status is: Inpatient Remains inpatient appropriate because: need for continued inpatient care   Consultants:  GI Orthopedics/Duda  Procedures:  none  Antimicrobials:  Anti-infectives (From admission, onward)    Start     Dose/Rate Route Frequency Ordered Stop   02/29/24 1000  vancomycin  (VANCOCIN ) IVPB 1000 mg/200 mL premix        1,000 mg 200 mL/hr over 60 Minutes Intravenous 2 times daily 02/28/24 2242     02/28/24 2245  meropenem  (MERREM ) 1 g in sodium chloride  0.9 % 100 mL IVPB        1 g 200 mL/hr over 30 Minutes Intravenous Every 8 hours 02/28/24 2240     02/28/24 2245  vancomycin  (VANCOREADY) IVPB 1500 mg/300 mL        1,500  mg 150 mL/hr over 120 Minutes Intravenous  Once 02/28/24 2240 02/29/24 0250       Subjective: No complaitns  Objective: Vitals:   02/29/24 0555 02/29/24 0800 02/29/24 0802 02/29/24 0900  BP: (!) 140/82 (!) 141/82 (!) 141/82 129/85  Pulse: 91 86 87 87  Resp: 17 16 17    Temp: 98.7 F (37.1 C)  97.9 F (36.6 C)    TempSrc: Oral  Oral   SpO2: 99% 100% 99%   Weight:      Height:        Intake/Output Summary (Last 24 hours) at 02/29/2024 1036 Last data filed at 02/29/2024 0826 Gross per 24 hour  Intake 808.67 ml  Output 575 ml  Net 233.67 ml   Filed Weights   02/28/24 2200 02/29/24 0105  Weight: 64 kg 65 kg    Examination:  General exam: Appears calm and comfortable  Respiratory system: Clear to auscultation. Respiratory effort normal. Cardiovascular system: RRR Gastrointestinal system: s/nt/nd Central nervous system: Alert and oriented. No focal neurological deficits.  No asterixis. Extremities: RLE with dressing intact - palpable DP pulse  Data Reviewed: I have personally reviewed following labs and imaging studies  CBC: Recent Labs  Lab 02/28/24 1557 02/28/24 1627 02/29/24 0018 02/29/24 0644  WBC 6.3  --   --   --   NEUTROABS 4.0  --   --   --   HGB 6.4* 7.8* 6.2* 7.5*  HCT 20.8* 23.0* 20.6* 23.6*  MCV 76.2*  --   --   --   PLT 156  --   --   --     Basic Metabolic Panel: Recent Labs  Lab 02/28/24 1627 02/28/24 1759 02/29/24 0644  NA 136 137  --   K 6.6* 4.1  --   CL  --  102  --   CO2  --  19*  --   GLUCOSE  --  557*  --   BUN  --  7  --   CREATININE  --  0.78  --   CALCIUM   --  9.3  --   MG  --   --  1.1*    GFR: Estimated Creatinine Clearance: 98.6 mL/min (by C-G formula based on SCr of 0.78 mg/dL).  Liver Function Tests: Recent Labs  Lab 02/28/24 1759  AST 36  ALT 20  ALKPHOS 277*  BILITOT 2.2*  PROT 9.1*  ALBUMIN 2.8*    CBG: Recent Labs  Lab 02/29/24 0801  GLUCAP 249*     Recent Results (from the past 240 hours)  Culture, blood (Routine X 2) w Reflex to ID Panel     Status: None (Preliminary result)   Collection Time: 02/29/24 12:19 AM   Specimen: BLOOD RIGHT HAND  Result Value Ref Range Status   Specimen Description BLOOD RIGHT HAND  Final   Special Requests   Final    BOTTLES DRAWN AEROBIC AND ANAEROBIC Blood Culture adequate  volume Performed at [facility], [address], [place], [place] [address]    Culture PENDING  Incomplete   Report Status PENDING  Incomplete         Radiology Studies: MR BRAIN WO CONTRAST Result Date: 02/29/2024 CLINICAL DATA:  53 year old male status post seizure. EXAM: MRI HEAD WITHOUT CONTRAST TECHNIQUE: Multiplanar, multiecho pulse sequences of the brain and surrounding structures were obtained without intravenous contrast. COMPARISON:  Head CT yesterday.  Brain MRI 09/18/2020. FINDINGS: Brain: Stable cerebral volume since 2022. Intermittent mild motion artifact. No  restricted diffusion to suggest acute infarction. No midline shift, mass effect, evidence of mass lesion, ventriculomegaly, extra-axial collection or acute intracranial hemorrhage. Cervicomedullary junction and pituitary are within normal limits. Chronic patchy T2 and FLAIR hyperintensity in the periventricular white matter is stable and nonspecific. No cortical encephalomalacia or chronic cerebral blood products identified. Intrinsic increased T1 signal in the bilateral globus pallidus (series 15, image 29). Deep gray nuclei, brainstem and cerebellum otherwise negative. Vascular: Major intracranial vascular flow voids are stable since 2022, preserved. Skull and upper cervical spine: Conspicuous DWI signal in the calvarium (series 5, image 79) but normal T1 marrow signal, and stable findings compared to 2022. Negative visible cervical spine. Sinuses/Orbits: Stable. Chronic right orbital floor fracture. Otherwise negative. Other: Mastoids are clear. Visible internal auditory structures appear normal. Negative visible scalp and face. IMPRESSION: 1. Intrinsic T1 hyperintensity in the bilateral globus pallidus, likely due to hepatic insufficiency, cirrhosis. Correlation with serum ammonia levels is recommended. 2. No superimposed acute intracranial abnormality. Chronic nonspecific cerebral volume loss and chronic white matter  signal changes are stable since 2022 and nonspecific. Electronically Signed   By: VEAR Hurst M.D.   On: 02/29/2024 05:59   CT ANKLE RIGHT WO CONTRAST Result Date: 02/28/2024 CLINICAL DATA:  Right ankle and foot bone and soft tissue infection. Original injury was 12/18/2023. ORIF performed 01/06/2024 for Raja Caputi bimalleolar fracture with dislocation. Fibular sideplate fixation plating was subsequently removed 02/04/2024. He presents with worsening pain and Annibelle Brazie draining open wound laterally. EXAM: CT OF THE RIGHT ANKLE WITHOUT CONTRAST TECHNIQUE: Multidetector CT imaging of the right ankle was performed according to the standard protocol. Multiplanar CT image reconstructions were also generated. RADIATION DOSE REDUCTION: This exam was performed according to the departmental dose-optimization program which includes automated exposure control, adjustment of the mA and/or kV according to patient size and/or use of iterative reconstruction technique. COMPARISON:  Right ankle series from today, right ankle series 2 weeks postoperative 01/18/2024. There are no intervening studies. FINDINGS: Bones/Joint/Cartilage There is Caelan Branden marked interval worsening since 01/18/2024. Evidence of erosive tibiotalar septic arthropathy and joint collapse, with fluid and bony/calcific debris in the joint space. Erosive changes extend over the talar fall dome and portions of the tibial plafond. Interval bone density loss is seen in the distal tibia, the talus, and the of fibular/malleolar fracture fragment. There is removed lateral fibular sideplate hardware, with increased lateral and slight cephalad displacement of the lateral malleolar fracture fragment up to nearly 1 shaft width laterally, with increased lateral subluxation of the talus of up to 1.5 cm with respect to the medial edge of the tibial plafond. Two medial malleolar screws remain in place, both of which demonstrate perihardware lucency consistent with loosening and both of which have backed  out from the bone about 6 mm. There is increasing gapping between the medial malleolar fracture fragment in the parent bone now up to 4 mm as well. Metallic transsyndesmotic cable fixation plate remains medially and has also increasingly separated from the tibia. There are widespread periosteal amorphous/dystrophic calcifications around the distal tibia and fibula, most if not all of which is probably periostitis related to osteomyelitis. Small scattered cortical erosions are noted over posterior malleolus, medial distal tibial metaphysis, and the lateral malleolar fracture fragment in addition to the tibiotalar joint surface erosions. There are no overt destructive changes to the calcaneus, midfoot and visualized proximal metatarsals. Ligaments Suboptimally assessed by CT. Muscles and Tendons There is diffuse edema in the distal foreleg muscles which could be reactive or  due to myositis. The plantar intrinsic foot musculature is unremarkable. Tendons are difficult to evaluate due to the degree of surrounding edema but are grossly intact as far as seen. No intramuscular abscess or focal fluid collection is seen, no intramuscular soft tissue gas. Soft tissues Diffuse moderate to severe edema. There soft tissue ulceration noted laterally at the level of the distal fibular fracture, the ulcer crater extending almost down to the bone. There is overlying bandaging, probably some packing material in it. There is Suzette Flagler the more shallow surface ulceration medially at the level of the medial malleolar fracture. No underlying localizing fluid collection or soft tissue gas is seen. IMPRESSION: 1. Marked interval worsening since 01/18/2024. 2. New erosive tibiotalar arthropathy and joint collapse, almost certainly on Walda Hertzog septic basis, with fluid and bony/calcific debris in the joint space. 3. Interval bone density loss in the distal tibia, talus, and lateral malleolar fracture fragment. 4. Increased lateral and slight cephalad  displacement of the lateral malleolar fracture fragment up to nearly 1 shaft width laterally, with increased lateral subluxation of the talus. 5. Two medial malleolar screws remain, both with perihardware lucency consistent with loosening and both of which have backed out about 6 mm. 6. Metallic transsyndesmotic cable fixation plate remains medially and has also increasingly separated from the tibia. 7. Widespread periosteal amorphous/dystrophic calcifications around the distal tibia and fibula, most if not all of which is probably periostitis related to osteomyelitis. Scattered tiny cortical erosions. 8. Diffuse edema in the distal foreleg muscles which could be reactive or due to myositis. 9. Soft tissue ulceration laterally at the level of the distal fibular fracture, the ulcer crater extending almost down to the bone. 10. More shallow surface ulceration medially at the level of the medial malleolar fracture. 11. No underlying localizing fluid collection or soft tissue gas is seen. Electronically Signed   By: Francis Quam M.D.   On: 02/28/2024 23:53   CT Head Wo Contrast Result Date: 02/28/2024 CLINICAL DATA:  Status post seizure. EXAM: CT HEAD WITHOUT CONTRAST TECHNIQUE: Contiguous axial images were obtained from the base of the skull through the vertex without intravenous contrast. RADIATION DOSE REDUCTION: This exam was performed according to the departmental dose-optimization program which includes automated exposure control, adjustment of the mA and/or kV according to patient size and/or use of iterative reconstruction technique. COMPARISON:  None Available. FINDINGS: Brain: There is generalized cerebral atrophy with widening of the extra-axial spaces and ventricular dilatation. There are areas of decreased attenuation within the white matter tracts of the supratentorial brain, consistent with microvascular disease changes. Vascular: No hyperdense vessel or unexpected calcification. Skull: Normal.  Negative for fracture or focal lesion. Sinuses/Orbits: No acute finding. Other: None. IMPRESSION: 1. Generalized cerebral atrophy with chronic white matter small vessel ischemic changes. 2. No acute intracranial abnormality. Electronically Signed   By: Suzen Dials M.D.   On: 02/28/2024 18:15   DG Ankle Complete Right Result Date: 02/28/2024 CLINICAL DATA:  Evaluate for osteomyelitis. Right ankle surgery 1 month ago. Hardware removal 2 weeks ago. There is Abriella Filkins large knot on the medial ankle with an open wound on the lateral ankle. EXAM: RIGHT ANKLE - COMPLETE 3+ VIEW COMPARISON:  01/18/2024 FINDINGS: Healing fractures of the medial and lateral malleolus with residual fracture lines present possibly indicating nonunion. Interval removal of Tabytha Gradillas previous lateral plate and screw. Two fixation screws still demonstrated in the medial malleolus. There is about 9 mm lateral displacement of the talus and of the distal fibular fragment with  respect to the tibia. Displacement is new since the previous study which demonstrated near anatomic alignment. Soft tissue gas demonstrated over the medial and lateral malleolus suggesting soft tissue ulceration or infection. Periosteal reaction in bone loss demonstrated at the lateral malleolar fracture. This could indicate osteomyelitis or bone healing. Mild diffuse soft tissue swelling. IMPRESSION: 1. Previous medial and lateral malleolus fractures with residual fracture lines may indicate nonunion. 2. Interval removal of lateral plate and screw. 3. Interval lateral displacement of the talus and the distal lateral malleolar fragment with respect to the tibia. 4. Soft tissue ulceration and soft tissue gas. Underlying periosteal reaction in bone loss in the lateral malleolus could indicate osteomyelitis or bone healing. Electronically Signed   By: Elsie Gravely M.D.   On: 02/28/2024 17:11   DG Chest Port 1 View Result Date: 02/28/2024 CLINICAL DATA:  Sepsis. EXAM: PORTABLE CHEST  1 VIEW COMPARISON:  12/18/2023 FINDINGS: The heart size and mediastinal contours are within normal limits. Both lungs are clear. The visualized skeletal structures are unremarkable. IMPRESSION: No active disease. Electronically Signed   By: Norleen DELENA Kil M.D.   On: 02/28/2024 16:57        Scheduled Meds:  folic acid   1 mg Oral Daily   insulin  aspart  0-15 Units Subcutaneous TID WC   lactulose   20 g Oral TID   levETIRAcetam   500 mg Oral BID   LORazepam   0-4 mg Oral Q6H   Followed by   NOREEN ON 03/02/2024] LORazepam   0-4 mg Oral Q12H   multivitamin with minerals  1 tablet Oral Daily   pantoprazole  (PROTONIX ) IV  40 mg Intravenous Q12H   thiamine   100 mg Oral Daily   Or   thiamine   100 mg Intravenous Daily   Continuous Infusions:  magnesium  sulfate bolus IVPB 4 g (02/29/24 0915)   meropenem  (MERREM ) IV 1 g (02/29/24 0636)   vancomycin  1,000 mg (02/29/24 1007)     LOS: 1 day    Time spent: over 30 min    Meliton Monte, MD [facility]   To contact the attending provider between 7A-7P or the covering provider during after hours 7P-7A, please log into the web site [email or web address] and access using universal [place] password for that web site. If you do not have the password, please call the hospital operator.  02/29/2024, 10:36 AM

## 2024-02-29 NOTE — ED Notes (Signed)
 IV abx started prior to Blood cx collection.

## 2024-03-01 ENCOUNTER — Encounter (HOSPITAL_COMMUNITY): Payer: Self-pay | Admitting: Internal Medicine

## 2024-03-01 ENCOUNTER — Encounter (HOSPITAL_COMMUNITY)
Admission: EM | Disposition: A | Discharge status: Home / Self Care | Payer: Self-pay | Source: Home / Self Care | Attending: Family Medicine

## 2024-03-01 ENCOUNTER — Inpatient Hospital Stay (HOSPITAL_COMMUNITY): Payer: Self-pay | Admitting: Anesthesiology

## 2024-03-01 ENCOUNTER — Inpatient Hospital Stay (HOSPITAL_COMMUNITY)

## 2024-03-01 DIAGNOSIS — I480 Paroxysmal atrial fibrillation: Secondary | ICD-10-CM | POA: Diagnosis not present

## 2024-03-01 DIAGNOSIS — R569 Unspecified convulsions: Secondary | ICD-10-CM | POA: Diagnosis not present

## 2024-03-01 DIAGNOSIS — K2950 Unspecified chronic gastritis without bleeding: Secondary | ICD-10-CM

## 2024-03-01 DIAGNOSIS — E119 Type 2 diabetes mellitus without complications: Secondary | ICD-10-CM | POA: Diagnosis not present

## 2024-03-01 DIAGNOSIS — K254 Chronic or unspecified gastric ulcer with hemorrhage: Secondary | ICD-10-CM | POA: Diagnosis not present

## 2024-03-01 DIAGNOSIS — R739 Hyperglycemia, unspecified: Principal | ICD-10-CM

## 2024-03-01 DIAGNOSIS — K449 Diaphragmatic hernia without obstruction or gangrene: Secondary | ICD-10-CM

## 2024-03-01 DIAGNOSIS — D508 Other iron deficiency anemias: Secondary | ICD-10-CM

## 2024-03-01 DIAGNOSIS — K3189 Other diseases of stomach and duodenum: Secondary | ICD-10-CM

## 2024-03-01 DIAGNOSIS — S52232A Displaced oblique fracture of shaft of left ulna, initial encounter for closed fracture: Secondary | ICD-10-CM | POA: Diagnosis not present

## 2024-03-01 DIAGNOSIS — I8500 Esophageal varices without bleeding: Secondary | ICD-10-CM

## 2024-03-01 DIAGNOSIS — D509 Iron deficiency anemia, unspecified: Secondary | ICD-10-CM | POA: Diagnosis not present

## 2024-03-01 DIAGNOSIS — F418 Other specified anxiety disorders: Secondary | ICD-10-CM

## 2024-03-01 DIAGNOSIS — K250 Acute gastric ulcer with hemorrhage: Secondary | ICD-10-CM

## 2024-03-01 DIAGNOSIS — D649 Anemia, unspecified: Secondary | ICD-10-CM | POA: Diagnosis not present

## 2024-03-01 DIAGNOSIS — M86271 Subacute osteomyelitis, right ankle and foot: Secondary | ICD-10-CM

## 2024-03-01 HISTORY — PX: HEMOSTASIS CLIP PLACEMENT: SHX6857

## 2024-03-01 HISTORY — PX: BONE BIOPSY: SHX375

## 2024-03-01 HISTORY — PX: ESOPHAGOGASTRODUODENOSCOPY: SHX5428

## 2024-03-01 LAB — TYPE AND SCREEN
ABO/RH(D): O POS
Antibody Screen: NEGATIVE
Unit division: 0
Unit division: 0
Unit division: 0

## 2024-03-01 LAB — BPAM RBC
Blood Product Expiration Date: 202509162359
Blood Product Expiration Date: 202509162359
Blood Product Expiration Date: 202509172359
ISSUE DATE / TIME: 202508240050
ISSUE DATE / TIME: 202508240821
Unit Type and Rh: 5100
Unit Type and Rh: 5100
Unit Type and Rh: 5100

## 2024-03-01 LAB — COMPREHENSIVE METABOLIC PANEL WITH GFR
ALT: 18 U/L (ref 0–44)
AST: 42 U/L — ABNORMAL HIGH (ref 15–41)
Albumin: 2 g/dL — ABNORMAL LOW (ref 3.5–5.0)
Alkaline Phosphatase: 235 U/L — ABNORMAL HIGH (ref 38–126)
Anion gap: 8 (ref 5–15)
BUN: <5 mg/dL — ABNORMAL LOW (ref 6–20)
CO2: 24 mmol/L (ref 22–32)
Calcium: 7.9 mg/dL — ABNORMAL LOW (ref 8.9–10.3)
Chloride: 100 mmol/L (ref 98–111)
Creatinine, Ser: 0.5 mg/dL — ABNORMAL LOW (ref 0.61–1.24)
GFR, Estimated: >60 mL/min (ref >60)
Glucose, Bld: 199 mg/dL — ABNORMAL HIGH (ref 70–99)
Potassium: 3 mmol/L — ABNORMAL LOW (ref 3.5–5.1)
Sodium: 132 mmol/L — ABNORMAL LOW (ref 135–145)
Total Bilirubin: 3.6 mg/dL — ABNORMAL HIGH (ref 0.0–1.2)
Total Protein: 7.5 g/dL (ref 6.5–8.1)

## 2024-03-01 LAB — CBC WITH DIFFERENTIAL/PLATELET
Abs Granulocyte: 2.4 K/uL (ref 1.5–6.5)
Abs Immature Granulocytes: 0.02 K/uL (ref 0.00–0.07)
Basophils Absolute: 0.1 K/uL (ref 0.0–0.1)
Basophils Relative: 1 %
Eosinophils Absolute: 0.1 K/uL (ref 0.0–0.5)
Eosinophils Relative: 3 %
HCT: 24.9 % — ABNORMAL LOW (ref 39.0–52.0)
Hemoglobin: 8.2 g/dL — ABNORMAL LOW (ref 13.0–17.0)
Immature Granulocytes: 1 %
Lymphocytes Relative: 28 %
Lymphs Abs: 1.2 K/uL (ref 0.7–4.0)
MCH: 24.3 pg — ABNORMAL LOW (ref 26.0–34.0)
MCHC: 32.9 g/dL (ref 30.0–36.0)
MCV: 73.7 fL — ABNORMAL LOW (ref 80.0–100.0)
Monocytes Absolute: 0.5 K/uL (ref 0.1–1.0)
Monocytes Relative: 12 %
Neutro Abs: 2.4 K/uL (ref 1.7–7.7)
Neutrophils Relative %: 55 %
Platelets: 115 K/uL — ABNORMAL LOW (ref 150–400)
RBC: 3.38 MIL/uL — ABNORMAL LOW (ref 4.22–5.81)
RDW: 23.3 % — ABNORMAL HIGH (ref 11.5–15.5)
WBC: 4.4 K/uL (ref 4.0–10.5)
nRBC: 0 % (ref 0.0–0.2)

## 2024-03-01 LAB — GLUCOSE, CAPILLARY
Glucose-Capillary: 225 mg/dL — ABNORMAL HIGH (ref 70–99)
Glucose-Capillary: 221 mg/dL — ABNORMAL HIGH (ref 70–99)
Glucose-Capillary: 247 mg/dL — ABNORMAL HIGH (ref 70–99)
Glucose-Capillary: 319 mg/dL — ABNORMAL HIGH (ref 70–99)
Glucose-Capillary: 307 mg/dL — ABNORMAL HIGH (ref 70–99)
Glucose-Capillary: 313 mg/dL — ABNORMAL HIGH (ref 70–99)

## 2024-03-01 LAB — FOLATE: Folate: 13.5 ng/mL (ref >5.9)

## 2024-03-01 LAB — VITAMIN B12: Vitamin B-12: 392 pg/mL (ref 180–914)

## 2024-03-01 LAB — IRON AND TIBC
Iron: 151 ug/dL (ref 45–182)
Saturation Ratios: 43 % — ABNORMAL HIGH (ref 17.9–39.5)
TIBC: 350 ug/dL (ref 250–450)
UIBC: 199 ug/dL

## 2024-03-01 LAB — FERRITIN: Ferritin: 20 ng/mL — ABNORMAL LOW (ref 24–336)

## 2024-03-01 LAB — PHOSPHORUS: Phosphorus: 2.9 mg/dL (ref 2.5–4.6)

## 2024-03-01 LAB — MAGNESIUM: Magnesium: 1.2 mg/dL — ABNORMAL LOW (ref 1.7–2.4)

## 2024-03-01 SURGERY — EGD (ESOPHAGOGASTRODUODENOSCOPY)
Anesthesia: Monitor Anesthesia Care

## 2024-03-01 MED ORDER — POTASSIUM CHLORIDE CRYS ER 20 MEQ PO TBCR
40.0000 meq | EXTENDED_RELEASE_TABLET | ORAL | Status: AC
  Administered 2024-03-01 (×2): 40 meq via ORAL
  Filled 2024-03-01 (×2): qty 2

## 2024-03-01 MED ORDER — PROPOFOL 500 MG/50ML IV EMUL
INTRAVENOUS | Status: DC | PRN
  Administered 2024-03-01: 150 ug/kg/min via INTRAVENOUS

## 2024-03-01 MED ORDER — INSULIN ASPART 100 UNIT/ML IJ SOLN
2.0000 [IU] | Freq: Once | INTRAMUSCULAR | Status: AC
  Administered 2024-03-01: 2 [IU] via SUBCUTANEOUS

## 2024-03-01 MED ORDER — SODIUM CHLORIDE 0.9 % IV SOLN: INTRAVENOUS | Status: DC

## 2024-03-01 MED ORDER — INSULIN GLARGINE 100 UNIT/ML ~~LOC~~ SOLN
15.0000 [IU] | Freq: Every day | SUBCUTANEOUS | Status: DC
  Administered 2024-03-01 – 2024-03-02 (×2): 15 [IU] via SUBCUTANEOUS
  Filled 2024-03-01 (×2): qty 0.15

## 2024-03-01 MED ORDER — DEXMEDETOMIDINE HCL IN NACL 80 MCG/20ML IV SOLN
INTRAVENOUS | Status: AC
  Filled 2024-03-01: qty 20

## 2024-03-01 MED ORDER — PROPOFOL 10 MG/ML IV BOLUS
INTRAVENOUS | Status: DC | PRN
  Administered 2024-03-01: 20 mg via INTRAVENOUS
  Administered 2024-03-01: 30 mg via INTRAVENOUS
  Administered 2024-03-01: 50 mg via INTRAVENOUS

## 2024-03-01 MED ORDER — PHENYLEPHRINE HCL (PRESSORS) 10 MG/ML IV SOLN
INTRAVENOUS | Status: DC | PRN
  Administered 2024-03-01: 80 ug via INTRAVENOUS

## 2024-03-01 MED ORDER — MAGNESIUM SULFATE 4 GM/100ML IV SOLN
4.0000 g | Freq: Once | INTRAVENOUS | Status: AC
  Administered 2024-03-01: 4 g via INTRAVENOUS
  Filled 2024-03-01 (×2): qty 100

## 2024-03-01 MED ORDER — INSULIN ASPART 100 UNIT/ML IJ SOLN
5.0000 [IU] | Freq: Once | INTRAMUSCULAR | Status: AC
  Administered 2024-03-01: 5 [IU] via SUBCUTANEOUS

## 2024-03-01 MED ORDER — GLYCOPYRROLATE PF 0.2 MG/ML IJ SOSY
PREFILLED_SYRINGE | INTRAMUSCULAR | Status: DC | PRN
  Administered 2024-03-01: .1 mg via INTRAVENOUS

## 2024-03-01 MED ORDER — LIDOCAINE 2% (20 MG/ML) 5 ML SYRINGE
INTRAMUSCULAR | Status: DC | PRN
  Administered 2024-03-01: 60 mg via INTRAVENOUS

## 2024-03-01 MED ORDER — DEXMEDETOMIDINE HCL IN NACL 80 MCG/20ML IV SOLN
INTRAVENOUS | Status: DC | PRN
  Administered 2024-03-01 (×3): 4 ug via INTRAVENOUS

## 2024-03-01 NOTE — Transfer of Care (Signed)
 Immediate Anesthesia Transfer of Care Note  Patient: Gary Mitchell  Procedure(s) Performed: EGD (ESOPHAGOGASTRODUODENOSCOPY) BIOPSY, GI CONTROL OF HEMORRHAGE, GI TRACT, ENDOSCOPIC, BY CLIPPING OR OVERSEWING  Patient Location: PACU  Anesthesia Type:MAC  Level of Consciousness: sedated  Airway & Oxygen Therapy: Patient Spontanous Breathing and Patient connected to face mask oxygen  Post-op Assessment: Report given to RN and Post -op Vital signs reviewed and stable  Post vital signs: Reviewed and stable  Last Vitals:  Vitals Value Taken Time  BP    Temp    Pulse 82 03/01/24 09:18  Resp 17 03/01/24 09:18  SpO2 100 % 03/01/24 09:18  Vitals shown include unfiled device data.  Last Pain:  Vitals:   03/01/24 0821  TempSrc: Temporal  PainSc: 7       Patients Stated Pain Goal: 7 (03/01/24 9178)  Complications: No notable events documented.

## 2024-03-01 NOTE — Inpatient Diabetes Management (Signed)
 Inpatient Diabetes Program Recommendations  AACE/ADA: New Consensus Statement on Inpatient Glycemic Control (2015)  Target Ranges:  Prepandial:   less than 140 mg/dL      Peak postprandial:   less than 180 mg/dL (1-2 hours)      Critically ill patients:  140 - 180 mg/dL    Latest Reference Range & Units 12/30/23 14:23  Hemoglobin A1C 4.8 - 5.6 % 7.1 (H)  (H): Data is abnormally high  Latest Reference Range & Units 02/29/24 08:01 02/29/24 12:02 02/29/24 17:27 02/29/24 21:24  Glucose-Capillary 70 - 99 mg/dL 750 (H)  3 units Novolog   269 (H)  8 units Novolog   215 (H)  5 units Novolog   311 (H)  5 units Novolog    (H): Data is abnormally high  Latest Reference Range & Units 03/01/24 08:16  Glucose-Capillary 70 - 99 mg/dL 778 (H)  (H): Data is abnormally high    Admit with:  Right Lower Extremity Infection  Infected Hardware  Septic Arthritis  Osteomyelitis  Myositis Seizures Acute Metabolic Encephalopathy  History: DM2, ETOH Abuse, Cirrhosis, Recent closed R ankle fracture s/p ORIF 01/06/2024   Home DM Meds: Freestyle Libre 3 CGM       Jardiance  10 mg daily       Lantus  30 units daily       Humalog  10 units TID       Metformin  1000 mg BID  Current Orders: Novolog  Moderate Correction Scale/ SSI (0-15 units) TID AC    NPO for EGD today  MD- Note pt takes Lantus  insulin  at home.  CBG 221 this AM.  Please consider starting 50% home dose basal insulin :  Insulin  Glargine 15 units daily     --Will follow patient during hospitalization--  Adina Rudolpho Arrow RN, MSN, CDCES Diabetes Coordinator Inpatient Glycemic Control Team Team Pager: [phone] (8a-5p)

## 2024-03-01 NOTE — Op Note (Signed)
 [facility] Patient Name: Gary Mitchell Procedure Date : 03/01/2024 MRN: [id number] Attending MD: Elspeth SQUIBB. Leigh , MD, [id number] Date of Birth: Jan 26, 1971 CSN: [id number] Age: 53 Admit Type: Inpatient Procedure:                Upper GI endoscopy Indications:              Anemia, component of iron  deficiency. No overt GI                            blood loss. Cirrhosis - small varices in the past.                            Recent RLE injury / wound vac with bleeding there.                            EGD to exclude other causes of anemia. Eliquis  held                            for 2 days Providers:                Elspeth P. Leigh, MD, Collene Edu, RN, Coye Bade, Technician Referring MD:              Medicines:                Monitored Anesthesia Care Complications:            No immediate complications. Estimated blood loss:                            Minimal. Estimated Blood Loss:     Estimated blood loss was minimal. Procedure:                Pre-Anesthesia Assessment:                           - Prior to the procedure, a History and Physical                            was performed, and patient medications and                            allergies were reviewed. The patient's tolerance of                            previous anesthesia was also reviewed. The risks                            and benefits of the procedure and the sedation                            options and risks were discussed with the patient.  All questions were answered, and informed consent                            was obtained. Prior Anticoagulants: The patient has                            taken Eliquis  (apixaban ), last dose was 2 days                            prior to procedure. ASA Grade Assessment: IV - A                            patient with severe systemic disease that is a                            constant  threat to life. After reviewing the risks                            and benefits, the patient was deemed in                            satisfactory condition to undergo the procedure.                           After obtaining informed consent, the endoscope was                            passed under direct vision. Throughout the                            procedure, the patient's blood pressure, pulse, and                            oxygen saturations were monitored continuously. The                            [id number]-H190 ([id number]) Olympus endoscope was introduced                            through the mouth, and advanced to the second part                            of duodenum. The upper GI endoscopy was                            accomplished without difficulty. The patient                            tolerated the procedure well. Scope In: Scope Out: Findings:      Esophagogastric landmarks were identified: the Z-line was found at 41       cm, the gastroesophageal junction was found at 41 cm and the upper       extent of the gastric folds was found  at 42 cm from the incisors.      A 1 cm hiatal hernia was present.      Grade I varices were found in the lower third of the esophagus. They       flattened with insufflation and were small in size. No stigmata for       bleeding.      The exam of the esophagus was otherwise normal.      Patchy mildly erythematous mucosa was found in the gastric fundus and in       the gastric body. Biopsies were taken with a cold forceps for       Helicobacter pylori testing.      One superficial gastric ulcer was found in the gastric antrum. There was       small amount of oozing at the peripheral edge of it upon entering the       stomasch. The lesion was 4-5 mm in largest dimension. For hemostasis,       one hemostatic clip was successfully placed across the ulcer. There was       no bleeding at the end of the procedure.      The exam of the stomach  was otherwise normal. No gastric varices.      A few areas of nodular mucosa was found in the distal duodenal bulb, one       area concerning for possible adenoma vs. benign peptic duodenitis.       Biopsies were taken with a cold forceps for histology, rule out adenoma.      The exam of the duodenum was otherwise normal. Impression:               - Esophagogastric landmarks identified.                           - 1 cm hiatal hernia.                           - Grade I esophageal varices / small - no stigmata                            for bleeding                           - Normal esophagus otherwise.                           - Erythematous mucosa in the gastric fundus and                            gastric body. Biopsied - rule out H pylori.                           - Oozing gastric ulcer. Clip was placed.                           - Normal stomach otherwise.                           - Nodular mucosa in the duodenal bulb. Biopsied.  Overall, no high risk varices. Small ulcer noted                            with some oozing that could be contributing to                            anemia in conjunction with RLE wound. Treated with                            hemostasis clip, biopsies taken to rule out H                            pylori. Recommendation:           - Return patient to hospital ward for ongoing care.                           - Full liquid diet now, then advance later as                            tolerated                           - Continue present medications.                           - Continue protonix  40mg  twice daily.                           - Can resume Eliquis  later today.                           - Await pathology results.                           - Monitor Hgb and watch for over GI blood loss Procedure Code(s):        --- Professional ---                           43255, 59, Esophagogastroduodenoscopy, flexible,                             transoral; with control of bleeding, any method                           43239, Esophagogastroduodenoscopy, flexible,                            transoral; with biopsy, single or multiple Diagnosis Code(s):        --- Professional ---                           K44.9, Diaphragmatic hernia without obstruction or                            gangrene  I85.00, Esophageal varices without bleeding                           K31.89, Other diseases of stomach and duodenum                           K25.4, Chronic or unspecified gastric ulcer with                            hemorrhage                           D50.9, Iron  deficiency anemia, unspecified CPT copyright 2022 [employer]. All rights reserved. The codes documented in this report are preliminary and upon coder review may  be revised to meet current compliance requirements. Elspeth P. Leigh, MD 03/01/2024 9:26:37 AM This report has been signed electronically. Number of Addenda: 0

## 2024-03-01 NOTE — Progress Notes (Signed)
 Date and time results received: 02/29/24 2124  Test: CBG Critical Value: 311  Name of Provider Notified: DOROTHA Kipper, NP  Orders Received- 5 units of insulin 

## 2024-03-01 NOTE — Anesthesia Postprocedure Evaluation (Signed)
 Anesthesia Post Note  Patient: Gary Mitchell  Procedure(s) Performed: EGD (ESOPHAGOGASTRODUODENOSCOPY) BIOPSY, GI CONTROL OF HEMORRHAGE, GI TRACT, ENDOSCOPIC, BY CLIPPING OR OVERSEWING     Patient location during evaluation: Endoscopy Anesthesia Type: MAC Level of consciousness: awake and alert Pain management: pain level controlled Vital Signs Assessment: post-procedure vital signs reviewed and stable Respiratory status: spontaneous breathing, nonlabored ventilation, respiratory function stable and patient connected to nasal cannula oxygen Cardiovascular status: blood pressure returned to baseline and stable Postop Assessment: no apparent nausea or vomiting Anesthetic complications: no   No notable events documented.  Last Vitals:  Vitals:   03/01/24 0937 03/01/24 0945  BP: 97/69 101/63  Pulse: 85 84  Resp: 13 16  Temp:    SpO2: 100% 99%    Last Pain:  Vitals:   03/01/24 0945  TempSrc:   PainSc: 7                  Garnette DELENA Gab

## 2024-03-01 NOTE — Consult Note (Signed)
 ORTHOPAEDIC CONSULTATION  REQUESTING PHYSICIAN: Perri DELENA Meliton Mickey., *  Chief Complaint: Ulcerations right ankle.  HPI: Gary Mitchell is a 54 y.o. male who presents with medial and lateral ulcerations right ankle.  Patient is status post open reduction internal fixation of the right ankle fracture July 1.  Patient had a hardware failure with infection and underwent debridement removal of hardware July 30.  Deep wound cultures from hardware removal showed vancomycin -resistant enterococci as an MRSA.  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   Seizures (HCC)    Seizures (HCC)    Thrombocytopenia (HCC)    Past Surgical History:  Procedure Laterality Date   BIOPSY  08/02/2021   Procedure: BIOPSY;  Surgeon: Teressa Toribio SQUIBB, MD;  Location: [facility];  Service: Endoscopy;;   BIOPSY  05/07/2022   Procedure: BIOPSY;  Surgeon: Stacia Glendia BRAVO, MD;  Location: [facility];  Service: Gastroenterology;;   COLONOSCOPY WITH PROPOFOL  N/A 05/08/2022   Procedure: COLONOSCOPY WITH PROPOFOL ;  Surgeon: Stacia Glendia BRAVO, MD;  Location: [facility];  Service: Gastroenterology;  Laterality: N/A;   ENTEROSCOPY N/A 09/20/2020   Procedure: ENTEROSCOPY;  Surgeon: Eda Iha, MD;  Location: [facility];  Service: Gastroenterology;  Laterality: N/A;   ENTEROSCOPY N/A 05/11/2022   Procedure:  ENTEROSCOPY;  Surgeon: Stacia Glendia BRAVO, MD;  Location: [facility];  Service: Gastroenterology;  Laterality: N/A;   ESOPHAGOGASTRODUODENOSCOPY (EGD) WITH PROPOFOL  N/A 08/02/2021   Procedure: ESOPHAGOGASTRODUODENOSCOPY (EGD) WITH PROPOFOL ;  Surgeon: Teressa Toribio SQUIBB, MD;  Location: [facility];  Service: Endoscopy;  Laterality: N/A;   ESOPHAGOGASTRODUODENOSCOPY (EGD) WITH PROPOFOL  N/A 01/31/2022   Procedure: ESOPHAGOGASTRODUODENOSCOPY (EGD) WITH PROPOFOL ;  Surgeon: Teressa Toribio SQUIBB, MD;  Location: [facility];  Service: Gastroenterology;  Laterality: N/A;   ESOPHAGOGASTRODUODENOSCOPY (EGD) WITH PROPOFOL  N/A 05/07/2022   Procedure: ESOPHAGOGASTRODUODENOSCOPY (EGD) WITH PROPOFOL ;  Surgeon: Stacia Glendia BRAVO, MD;  Location: [facility];  Service: Gastroenterology;  Laterality: N/A;   ESOPHAGOGASTRODUODENOSCOPY (EGD) WITH PROPOFOL  N/A 06/13/2022   Procedure: ESOPHAGOGASTRODUODENOSCOPY (EGD) WITH PROPOFOL ;  Surgeon: Wilhelmenia Aloha Mickey., MD;  Location: [facility];  Service: Gastroenterology;  Laterality: N/A;   EUS N/A 01/31/2022   Procedure: UPPER ENDOSCOPIC ULTRASOUND (EUS) RADIAL;  Surgeon: Teressa Toribio SQUIBB, MD;  Location: [facility];  Service: Gastroenterology;  Laterality: N/A;   EUS N/A 06/13/2022   Procedure: UPPER ENDOSCOPIC ULTRASOUND (EUS) LINEAR;  Surgeon: Wilhelmenia Aloha Mickey., MD;  Location: [facility];  Service: Gastroenterology;  Laterality: N/A;   FINE NEEDLE ASPIRATION N/A 08/02/2021   Procedure: FINE NEEDLE ASPIRATION (FNA) LINEAR;  Surgeon: Teressa Toribio SQUIBB, MD;  Location: [facility];  Service: Endoscopy;  Laterality: N/A;   FINE NEEDLE ASPIRATION N/A 01/31/2022   Procedure: FINE NEEDLE ASPIRATION (FNA) LINEAR;  Surgeon: Teressa Toribio SQUIBB, MD;  Location: [facility];  Service: Gastroenterology;  Laterality: N/A;  FINE NEEDLE ASPIRATION N/A 06/13/2022   Procedure: FINE NEEDLE ASPIRATION (FNA) LINEAR;  Surgeon: Wilhelmenia Aloha Raddle., MD;  Location: [facility];   Service: Gastroenterology;  Laterality: N/A;   GIVENS CAPSULE STUDY N/A 05/09/2022   Procedure: GIVENS CAPSULE STUDY;  Surgeon: Stacia Glendia BRAVO, MD;  Location: [facility];  Service: Gastroenterology;  Laterality: N/A;   HARDWARE REMOVAL Right 02/04/2024   Procedure: REMOVAL, HARDWARE;  Surgeon: Harden Jerona GAILS, MD;  Location: [facility];  Service: Orthopedics;  Laterality: Right;  HARDWARE REMOVAL RIGHT ANKLE   HEMOSTASIS CLIP PLACEMENT  05/08/2022   Procedure: HEMOSTASIS CLIP PLACEMENT;  Surgeon: Stacia Glendia BRAVO, MD;  Location: [facility];  Service: Gastroenterology;;   HEMOSTASIS CLIP PLACEMENT  05/11/2022   Procedure: HEMOSTASIS CLIP PLACEMENT;  Surgeon: Stacia Glendia BRAVO, MD;  Location: [facility];  Service: Gastroenterology;;   HOT HEMOSTASIS N/A 06/13/2022   Procedure: HOT HEMOSTASIS (ARGON PLASMA COAGULATION/BICAP);  Surgeon: Wilhelmenia Aloha Raddle., MD;  Location: [facility];  Service: Gastroenterology;  Laterality: N/A;   left kidney removed     ORIF ANKLE FRACTURE Right 01/06/2024   Procedure: OPEN REDUCTION INTERNAL FIXATION (ORIF) ANKLE FRACTURE;  Surgeon: Beverley Evalene BIRCH, MD;  Location: [facility];  Service: Orthopedics;  Laterality: Right;   POLYPECTOMY  05/08/2022   Procedure: POLYPECTOMY;  Surgeon: Stacia Glendia BRAVO, MD;  Location: [facility];  Service: Gastroenterology;;   UPPER ESOPHAGEAL ENDOSCOPIC ULTRASOUND (EUS) N/A 08/02/2021   Procedure: UPPER ESOPHAGEAL ENDOSCOPIC ULTRASOUND (EUS);  Surgeon: Teressa Toribio SQUIBB, MD;  Location: [facility];  Service: Endoscopy;  Laterality: N/A;  Radial and Linear   Social History   Socioeconomic History   Marital status: Single    Spouse name: Not on file   Number of children: Not on file   Years of education: Not on file   Highest education level: Not on file  Occupational History   Not on file  Tobacco Use   Smoking status: Former    Current packs/day: 0.00    Average packs/day: 0.1 packs/day for 25.0 years (2.5 ttl  pk-yrs)    Types: Cigarettes    Start date: 63    Quit date: 2019    Years since quitting: 6.6    Passive exposure: Past   Smokeless tobacco: Never   Tobacco comments:    Former smoker 08/08/22  Vaping Use   Vaping status: Never Used  Substance and Sexual Activity   Alcohol use: Not Currently   Drug use: Not Currently   Sexual activity: Not Currently  Other Topics Concern   Not on file  Social History Narrative   Not on file   Social Drivers of Health   Financial Resource Strain: High Risk (04/11/2023)   Overall Financial Resource Strain (CARDIA)    Difficulty of Paying Living Expenses: Hard  Food Insecurity: Food Insecurity Present (02/29/2024)   Hunger Vital Sign    Worried About Running Out of Food in the Last Year: Often true    Ran Out of Food in the Last Year: Often true  Transportation Needs: Unmet Transportation Needs (02/29/2024)   PRAPARE - Administrator, Civil Service (Medical): Yes    Lack of Transportation (Non-Medical): Yes  Physical Activity: Patient Declined (05/13/2023)   Exercise Vital Sign    Days of Exercise per Week: Patient declined    Minutes of Exercise per Session: Patient declined  Stress: No Stress Concern Present (05/13/2023)   [employer] of Occupational Health - Occupational Stress Questionnaire    Feeling of  Stress : Not at all  Social Connections: Socially Isolated (05/13/2023)   Social Connection and Isolation Panel    Frequency of Communication with Friends and Family: Once a week    Frequency of Social Gatherings with Friends and Family: Once a week    Attends Religious Services: Never    Database administrator or Organizations: No    Attends Engineer, structural: Never    Marital Status: Never married   Family History  Problem Relation Age of Onset   Diabetes Mellitus II Mother    Colon cancer Neg Hx    Esophageal cancer Neg Hx    Inflammatory bowel disease Neg Hx    Liver disease Neg Hx    Pancreatic  cancer Neg Hx    Rectal cancer Neg Hx    Stomach cancer Neg Hx    - negative except otherwise stated in the family history section Allergies  Allergen Reactions   Iohexol  Other (See Comments)    Unknown reaction at 72 days old Mom at bedside reported patient was given injections of iohexol  in foot and resulted in hole in foot or foot got infected   Nsaids Other (See Comments)    Patient said he was told he could take Tylenol  ONLY.   Prior to Admission medications   Medication Sig Start Date End Date Taking? Authorizing Provider  acetaminophen  (TYLENOL ) 500 MG tablet Take 1,500 mg by mouth daily as needed for moderate pain (pain score 4-6), fever or headache.   Yes [provider]  apixaban  (ELIQUIS ) 2.5 MG TABS tablet Take 1 tablet (2.5 mg total) by mouth 2 (two) times daily for 28 days. 01/08/24 07/07/24 Yes Drusilla Sabas RAMAN, MD  atorvastatin  (LIPITOR) 20 MG tablet Take 1 tablet (20 mg total) by mouth daily. 04/02/23  Yes Laurence Locus, DO  Continuous Glucose Sensor (FREESTYLE LIBRE 3 PLUS SENSOR) MISC Change sensor every 15 days. Use to check blood sugar continuously. 12/15/23  Yes Newlin, Enobong, MD  empagliflozin  (JARDIANCE ) 10 MG TABS tablet Take 1 tablet (10 mg total) by mouth daily before breakfast. 11/17/23  Yes Newlin, Enobong, MD  ibuprofen  (ADVIL ) 200 MG tablet Take 400 mg by mouth every 6 (six) hours as needed for mild pain (pain score 1-3) or moderate pain (pain score 4-6).   Yes [provider]  insulin  glargine (LANTUS  SOLOSTAR) 100 UNIT/ML Solostar Pen Inject 30 Units into the skin daily. 01/18/24  Yes Cardama, Raynell Moder, MD  insulin  lispro (HUMALOG  KWIKPEN) 100 UNIT/ML KwikPen Inject 10 Units into the skin 3 (three) times daily. 01/18/24  Yes Cardama, Raynell Moder, MD  lactulose  (CHRONULAC ) 10 GM/15ML solution Take 45 mLs (30 g total) by mouth as needed for mild constipation. 12/08/23  Yes Beather Delon Gibson, PA  levETIRAcetam  (KEPPRA ) 500 MG tablet Take 1  tablet (500 mg total) by mouth 2 (two) times daily. 01/18/24 04/17/24 Yes Cardama, Raynell Moder, MD  Menthol-Methyl Salicylate (MUSCLE RUB) 10-15 % CREA Apply 1 Application topically as needed for muscle pain (arm pain).   Yes [provider]  metFORMIN  (GLUCOPHAGE ) 500 MG tablet Take 2 tablets (1,000 mg total) by mouth 2 (two) times daily with a meal. 07/11/23  Yes Newlin, Enobong, MD  metoprolol  succinate (TOPROL -XL) 25 MG 24 hr tablet Take 0.5 tablets (12.5 mg total) by mouth daily. 01/08/24  Yes Drusilla Sabas RAMAN, MD  pantoprazole  (PROTONIX ) 40 MG tablet Take 1 tablet (40 mg total) by mouth 2 (two) times daily. Patient taking differently: Take 40  mg by mouth See admin instructions. Take 1 tablet (40mg ) by mouth daily. May take an additional tablet later in the day if needed for heartburn. 01/08/24  Yes Drusilla Sabas RAMAN, MD   EEG adult Result Date: 02/29/2024 Shelton Arlin KIDD, MD     02/29/2024  5:21 PM Patient Name: SULAYMAN MANNING MRN: [id number] Epilepsy Attending: Arlin KIDD Shelton Referring Physician/Provider: Perri DELENA Meliton Mickey., MD Date: 02/29/2024 Duration: 24.44 mins Patient history: 53yo M with ams. EEG to evaluate for seizure Level of alertness: Awake AEDs during EEG study: LEV, versed  Technical aspects: This EEG study was done with scalp electrodes positioned according to the 10-20 International system of electrode placement. Electrical activity was reviewed with band pass filter of 1-70Hz , sensitivity of 7 uV/mm, display speed of 56mm/sec with a 60Hz  notched filter applied as appropriate. EEG data were recorded continuously and digitally stored.  Video monitoring was available and reviewed as appropriate. Description: The posterior dominant rhythm consists of 8 Hz activity of moderate voltage (25-35 uV) seen predominantly in posterior head regions, symmetric and reactive to eye opening and eye closing. Hyperventilation and photic stimulation were not performed.   IMPRESSION: This study is within  normal limits. No seizures or epileptiform discharges were seen throughout the recording. Priyanka O Yadav   US  ASCITES (ABDOMEN LIMITED) Result Date: 02/29/2024 EXAM: LIMITED ABDOMINAL ULTRASOUND FOR ASCITES EVALUATION TECHNIQUE: Limited real-time sonography of all 4 quadrants of the abdomen was performed for evaluation of ascites. COMPARISON: None. CLINICAL HISTORY: 07165 Cirrhosis (HCC) [id number]. Cirrhosis (HCC). FINDINGS: RIGHT UPPER QUADRANT: No ascites seen. LEFT UPPER QUADRANT: No ascites seen. RIGHT LOWER QUADRANT: No ascites seen. LEFT LOWER QUADRANT: No ascites seen. OTHER: Limited visualization of the rest of the abdomen demonstrates no acute abnormality. IMPRESSION: 1. No significant ascites. Electronically signed by: Katheleen Faes MD 02/29/2024 12:17 PM EDT RP Workstation: [id number]   MR BRAIN WO CONTRAST Result Date: 02/29/2024 CLINICAL DATA:  53 year old male status post seizure. EXAM: MRI HEAD WITHOUT CONTRAST TECHNIQUE: Multiplanar, multiecho pulse sequences of the brain and surrounding structures were obtained without intravenous contrast. COMPARISON:  Head CT yesterday.  Brain MRI 09/18/2020. FINDINGS: Brain: Stable cerebral volume since 2022. Intermittent mild motion artifact. No restricted diffusion to suggest acute infarction. No midline shift, mass effect, evidence of mass lesion, ventriculomegaly, extra-axial collection or acute intracranial hemorrhage. Cervicomedullary junction and pituitary are within normal limits. Chronic patchy T2 and FLAIR hyperintensity in the periventricular white matter is stable and nonspecific. No cortical encephalomalacia or chronic cerebral blood products identified. Intrinsic increased T1 signal in the bilateral globus pallidus (series 15, image 29). Deep gray nuclei, brainstem and cerebellum otherwise negative. Vascular: Major intracranial vascular flow voids are stable since 2022, preserved. Skull and upper cervical spine: Conspicuous DWI signal in the  calvarium (series 5, image 79) but normal T1 marrow signal, and stable findings compared to 2022. Negative visible cervical spine. Sinuses/Orbits: Stable. Chronic right orbital floor fracture. Otherwise negative. Other: Mastoids are clear. Visible internal auditory structures appear normal. Negative visible scalp and face. IMPRESSION: 1. Intrinsic T1 hyperintensity in the bilateral globus pallidus, likely due to hepatic insufficiency, cirrhosis. Correlation with serum ammonia levels is recommended. 2. No superimposed acute intracranial abnormality. Chronic nonspecific cerebral volume loss and chronic white matter signal changes are stable since 2022 and nonspecific. Electronically Signed   By: VEAR Hurst M.D.   On: 02/29/2024 05:59   CT ANKLE RIGHT WO CONTRAST Result Date: 02/28/2024 CLINICAL DATA:  Right ankle and foot bone and soft tissue  infection. Original injury was 12/18/2023. ORIF performed 01/06/2024 for a bimalleolar fracture with dislocation. Fibular sideplate fixation plating was subsequently removed 02/04/2024. He presents with worsening pain and a draining open wound laterally. EXAM: CT OF THE RIGHT ANKLE WITHOUT CONTRAST TECHNIQUE: Multidetector CT imaging of the right ankle was performed according to the standard protocol. Multiplanar CT image reconstructions were also generated. RADIATION DOSE REDUCTION: This exam was performed according to the departmental dose-optimization program which includes automated exposure control, adjustment of the mA and/or kV according to patient size and/or use of iterative reconstruction technique. COMPARISON:  Right ankle series from today, right ankle series 2 weeks postoperative 01/18/2024. There are no intervening studies. FINDINGS: Bones/Joint/Cartilage There is a marked interval worsening since 01/18/2024. Evidence of erosive tibiotalar septic arthropathy and joint collapse, with fluid and bony/calcific debris in the joint space. Erosive changes extend over the  talar fall dome and portions of the tibial plafond. Interval bone density loss is seen in the distal tibia, the talus, and the of fibular/malleolar fracture fragment. There is removed lateral fibular sideplate hardware, with increased lateral and slight cephalad displacement of the lateral malleolar fracture fragment up to nearly 1 shaft width laterally, with increased lateral subluxation of the talus of up to 1.5 cm with respect to the medial edge of the tibial plafond. Two medial malleolar screws remain in place, both of which demonstrate perihardware lucency consistent with loosening and both of which have backed out from the bone about 6 mm. There is increasing gapping between the medial malleolar fracture fragment in the parent bone now up to 4 mm as well. Metallic transsyndesmotic cable fixation plate remains medially and has also increasingly separated from the tibia. There are widespread periosteal amorphous/dystrophic calcifications around the distal tibia and fibula, most if not all of which is probably periostitis related to osteomyelitis. Small scattered cortical erosions are noted over posterior malleolus, medial distal tibial metaphysis, and the lateral malleolar fracture fragment in addition to the tibiotalar joint surface erosions. There are no overt destructive changes to the calcaneus, midfoot and visualized proximal metatarsals. Ligaments Suboptimally assessed by CT. Muscles and Tendons There is diffuse edema in the distal foreleg muscles which could be reactive or due to myositis. The plantar intrinsic foot musculature is unremarkable. Tendons are difficult to evaluate due to the degree of surrounding edema but are grossly intact as far as seen. No intramuscular abscess or focal fluid collection is seen, no intramuscular soft tissue gas. Soft tissues Diffuse moderate to severe edema. There soft tissue ulceration noted laterally at the level of the distal fibular fracture, the ulcer crater  extending almost down to the bone. There is overlying bandaging, probably some packing material in it. There is a the more shallow surface ulceration medially at the level of the medial malleolar fracture. No underlying localizing fluid collection or soft tissue gas is seen. IMPRESSION: 1. Marked interval worsening since 01/18/2024. 2. New erosive tibiotalar arthropathy and joint collapse, almost certainly on a septic basis, with fluid and bony/calcific debris in the joint space. 3. Interval bone density loss in the distal tibia, talus, and lateral malleolar fracture fragment. 4. Increased lateral and slight cephalad displacement of the lateral malleolar fracture fragment up to nearly 1 shaft width laterally, with increased lateral subluxation of the talus. 5. Two medial malleolar screws remain, both with perihardware lucency consistent with loosening and both of which have backed out about 6 mm. 6. Metallic transsyndesmotic cable fixation plate remains medially and has also increasingly separated from  the tibia. 7. Widespread periosteal amorphous/dystrophic calcifications around the distal tibia and fibula, most if not all of which is probably periostitis related to osteomyelitis. Scattered tiny cortical erosions. 8. Diffuse edema in the distal foreleg muscles which could be reactive or due to myositis. 9. Soft tissue ulceration laterally at the level of the distal fibular fracture, the ulcer crater extending almost down to the bone. 10. More shallow surface ulceration medially at the level of the medial malleolar fracture. 11. No underlying localizing fluid collection or soft tissue gas is seen. Electronically Signed   By: Francis Quam M.D.   On: 02/28/2024 23:53   CT Head Wo Contrast Result Date: 02/28/2024 CLINICAL DATA:  Status post seizure. EXAM: CT HEAD WITHOUT CONTRAST TECHNIQUE: Contiguous axial images were obtained from the base of the skull through the vertex without intravenous contrast. RADIATION  DOSE REDUCTION: This exam was performed according to the departmental dose-optimization program which includes automated exposure control, adjustment of the mA and/or kV according to patient size and/or use of iterative reconstruction technique. COMPARISON:  None Available. FINDINGS: Brain: There is generalized cerebral atrophy with widening of the extra-axial spaces and ventricular dilatation. There are areas of decreased attenuation within the white matter tracts of the supratentorial brain, consistent with microvascular disease changes. Vascular: No hyperdense vessel or unexpected calcification. Skull: Normal. Negative for fracture or focal lesion. Sinuses/Orbits: No acute finding. Other: None. IMPRESSION: 1. Generalized cerebral atrophy with chronic white matter small vessel ischemic changes. 2. No acute intracranial abnormality. Electronically Signed   By: Suzen Dials M.D.   On: 02/28/2024 18:15   DG Ankle Complete Right Result Date: 02/28/2024 CLINICAL DATA:  Evaluate for osteomyelitis. Right ankle surgery 1 month ago. Hardware removal 2 weeks ago. There is a large knot on the medial ankle with an open wound on the lateral ankle. EXAM: RIGHT ANKLE - COMPLETE 3+ VIEW COMPARISON:  01/18/2024 FINDINGS: Healing fractures of the medial and lateral malleolus with residual fracture lines present possibly indicating nonunion. Interval removal of a previous lateral plate and screw. Two fixation screws still demonstrated in the medial malleolus. There is about 9 mm lateral displacement of the talus and of the distal fibular fragment with respect to the tibia. Displacement is new since the previous study which demonstrated near anatomic alignment. Soft tissue gas demonstrated over the medial and lateral malleolus suggesting soft tissue ulceration or infection. Periosteal reaction in bone loss demonstrated at the lateral malleolar fracture. This could indicate osteomyelitis or bone healing. Mild diffuse soft  tissue swelling. IMPRESSION: 1. Previous medial and lateral malleolus fractures with residual fracture lines may indicate nonunion. 2. Interval removal of lateral plate and screw. 3. Interval lateral displacement of the talus and the distal lateral malleolar fragment with respect to the tibia. 4. Soft tissue ulceration and soft tissue gas. Underlying periosteal reaction in bone loss in the lateral malleolus could indicate osteomyelitis or bone healing. Electronically Signed   By: Elsie Gravely M.D.   On: 02/28/2024 17:11   DG Chest Port 1 View Result Date: 02/28/2024 CLINICAL DATA:  Sepsis. EXAM: PORTABLE CHEST 1 VIEW COMPARISON:  12/18/2023 FINDINGS: The heart size and mediastinal contours are within normal limits. Both lungs are clear. The visualized skeletal structures are unremarkable. IMPRESSION: No active disease. Electronically Signed   By: Norleen DELENA Kil M.D.   On: 02/28/2024 16:57   - pertinent xrays, CT, MRI studies were reviewed and independently interpreted  Positive ROS: All other systems have been reviewed and were otherwise negative  with the exception of those mentioned in the HPI and as above.  Physical Exam: General: Alert, no acute distress Psychiatric: Patient is competent for consent with normal mood and affect Lymphatic: No axillary or cervical lymphadenopathy Cardiovascular: No pedal edema Respiratory: No cyanosis, no use of accessory musculature GI: No organomegaly, abdomen is soft and non-tender    Images:  @ENCIMAGES @  Labs:  Lab Results  Component Value Date   HGBA1C 7.1 (H) 12/30/2023   HGBA1C 8.6 (A) 10/14/2023   HGBA1C 8.3 (A) 07/11/2023   CRP 1.4 (H) 07/09/2020   REPTSTATUS PENDING 02/29/2024   GRAMSTAIN NO WBC SEEN NO ORGANISMS SEEN  02/04/2024   CULT PENDING 02/29/2024   LABORGA METHICILLIN RESISTANT STAPHYLOCOCCUS AUREUS 02/04/2024   LABORGA ENTEROCOCCUS GALLINARUM 02/04/2024    Lab Results  Component Value Date   ALBUMIN 2.2 (L) 02/29/2024    ALBUMIN 2.8 (L) 02/28/2024   ALBUMIN 3.2 (L) 01/06/2024   PREALBUMIN 14 (L) 05/06/2022        Latest Ref Rng & Units 02/29/2024   11:19 AM 02/29/2024    6:44 AM 02/29/2024   12:18 AM  CBC EXTENDED  WBC 4.0 - 10.5 K/uL 5.8     RBC 4.22 - 5.81 MIL/uL 3.13     Hemoglobin 13.0 - 17.0 g/dL 7.5  7.5  6.2   HCT 60.9 - 52.0 % 23.6  23.6  20.6   Platelets 150 - 400 K/uL 117     NEUT# 1.7 - 7.7 K/uL 4.4     Lymph# 0.7 - 4.0 K/uL 0.9       Neurologic: Patient does not have protective sensation bilateral lower extremities.   MUSCULOSKELETAL:   Skin: Examination patient has ulceration medially and laterally to the right ankle.  The lateral ulcer extends down to bone.  The medial ulcer is superficial.  Patient's initial presenting glucose was greater than 500.  White cell count 5.8 with an absolute neutrophil count of 4.4.  Most recent hemoglobin A1c was 7.1 in June.  Review of the CT scan shows advanced tibiotalar collapse with subluxation.  Assessment: Assessment: Progressive joint destruction right ankle with history of MRSA and VRE   Plan: Plan: Discussed with the patient with the advanced destructive bony changes and exposed bone laterally recommended proceeding with a right transtibial amputation.  Patient states he will not consider surgical intervention at this time.  Patient is not a good candidate for IV antibiotics.  Anticipate he could be discharged on oral antibiotics and I can follow-up in the office.  Thank you for the consult and the opportunity to see Mr. Neysa Jerona Sage, MD [facility] [phone] 7:35 AM

## 2024-03-01 NOTE — Interval H&P Note (Signed)
 History and Physical Interval Note: Patient here for EGD, no interval changes or bleeding symptoms. Eliquis  held for 2 days. I have discussed risks / benefits of the exam and anesthesia with him and he agrees and wishes to proceed. He otherwise denies any cardiopulmonary symptoms or complaints otherwise.   03/01/2024 8:39 AM  Gary Mitchell  has presented today for surgery, with the diagnosis of Anemia.  The various methods of treatment have been discussed with the patient and family. After consideration of risks, benefits and other options for treatment, the patient has consented to  Procedure(s): EGD (ESOPHAGOGASTRODUODENOSCOPY) (N/A) as a surgical intervention.  The patient's history has been reviewed, patient examined, no change in status, stable for surgery.  I have reviewed the patient's chart and labs.  Questions were answered to the patient's satisfaction.     Elspeth P Sicily Zaragoza

## 2024-03-01 NOTE — Progress Notes (Addendum)
 PROGRESS NOTE    Gary Mitchell  FMW:[id number] DOB: 1970/08/27 DOA: 02/28/2024 PCP: Delbert Clam, MD  Chief Complaint  Patient presents with   Hyperglycemia   Wound Infection    Brief Narrative:   53 yo with hx etoh abuse, cirrhosis, atrial fib on eliquis , T2DM and multiple other medical issues with recent closed R ankle fracture s/p ORIF 01/06/2024.  He was admitted by orthopedics with infected hardware 7/30 and had surgery per ortho with removal of hardware.  He was discharged on 8/1.  He's returned in the setting of being found down after Philamena Kramar suspected seizure and right lower extremity swelling/infection.    Assessment & Plan:   Principal Problem:   AMS (altered mental status) Active Problems:   Seizure (HCC)   Subacute osteomyelitis of right ankle (HCC)   Acute gastric ulcer with hemorrhage  Right Lower Extremity Infection  Infected Hardware  Septic Arthritis  Osteomyelitis  Myositis  CT with interval worsening since 7/113/2025 - erosive tibiotalar arthropathy and joint collapse.  Interval bone density loss in distal tibia, talus, and lateral malleolar fracture fragment.  Increased lateral and slight cephalad displacement of the lateral malleolar fracture fragment with increased lateral subluxation of the talus.  Medial malleolar screws with perihardware lucency c/w loosening (both backed out about 6 mm).  Metallic transsyndesmotic cable fixation plate remains medially and has increasingly separated from the tibia.  Widespread periosteal amorphous/dystrophic calcifications around the distal tibia and fibula (most thought to be periostitis related to osteomyelitis).  Diffuse edema in the distal foreleg muscles.  Soft tissue ulceration laterally at level of distal fibular fracture.  Shallow surface ulceration medially at the level of the medial malleolar fracture.  (See full report) S/p surgery 7/30 with operative cultures showing MRSA and VRE  Blood cultures pending Dr. Harden  recommending transtibial amputation, he's refusing this at this time Will ask for ID assistance Continue vancomycin  and unasyn    Seizures Acute Metabolic Encephalopathy Found down, apparently per EDP, eye witnesses noted multiple seizures Reported confusion for EMS, but back to normal when seen by EDP  MRI brain with T1 hyperintensity in the bilateral globus pallidus (due to hepatic insufficiency, cirrhosis) - no acute abnormality Follow EEG - no seizures or epileptiform discharges Continue his home keppra  - neurology recommending increasing to 1000 mg BID, will call if concerns PT/OT  Anemia Hb 6.4 at presentation (was 9.9 at the end of July) No reported GI bleeding, but notable hx cirrhosis S/p 1 unit pRBC Hb relatively stable today, trend Will follow iron  wnl, b12 wnl, folate wnl, ferritin (low)  Atrial Fibrillation Eliquis  currently on hold   Etoh Abuse [place]   Cirrhosis Elevated ammonia, but alert and oriented - no asterixis Will continue daily lactulose   T2DM 15 units SSI  Anion Gap Metabolic Acidosis Trend, mild   Hypomagnesemia Replace and follow   Left Arm Swelling After trauma, follow US  and plain films to forearm     DVT prophylaxis: SCD Code Status: full Family Communication: none Disposition:   Status is: Inpatient Remains inpatient appropriate because: need for continued inpatient care   Consultants:  GI Orthopedics/Duda  Procedures:  none  Antimicrobials:  Anti-infectives (From admission, onward)    Start     Dose/Rate Route Frequency Ordered Stop   02/29/24 2200  vancomycin  (VANCOCIN ) IVPB 1000 mg/200 mL premix        1,000 mg 200 mL/hr over 60 Minutes Intravenous Every 12 hours 02/29/24 1120     02/29/24 1400  Ampicillin -Sulbactam (  UNASYN ) 3 g in sodium chloride  0.9 % 100 mL IVPB        3 g 200 mL/hr over 30 Minutes Intravenous Every 6 hours 02/29/24 1118     02/29/24 1000  vancomycin  (VANCOCIN ) IVPB 1000 mg/200 mL premix  Status:   Discontinued        1,000 mg 200 mL/hr over 60 Minutes Intravenous 2 times daily 02/28/24 2242 02/29/24 1118   02/28/24 2245  meropenem  (MERREM ) 1 g in sodium chloride  0.9 % 100 mL IVPB  Status:  Discontinued        1 g 200 mL/hr over 30 Minutes Intravenous Every 8 hours 02/28/24 2240 02/29/24 1102   02/28/24 2245  vancomycin  (VANCOREADY) IVPB 1500 mg/300 mL        1,500 mg 150 mL/hr over 120 Minutes Intravenous  Once 02/28/24 2240 02/29/24 0250       Subjective: No new complaints  Objective: Vitals:   03/01/24 0918 03/01/24 0928 03/01/24 0937 03/01/24 0945  BP: 94/63 93/60 97/69  101/63  Pulse: 84 84 85 84  Resp: 17 18 13 16   Temp: (!) 97.5 F (36.4 C)     TempSrc: Temporal     SpO2: 100% 100% 100% 99%  Weight:      Height:        Intake/Output Summary (Last 24 hours) at 03/01/2024 1347 Last data filed at 03/01/2024 0800 Gross per 24 hour  Intake 300 ml  Output 1140 ml  Net -840 ml   Filed Weights   02/28/24 2200 02/29/24 0105 03/01/24 0821  Weight: 64 kg 65 kg 65 kg    Examination:  General: No acute distress. Cardiovascular: RRR Lungs: unlabored Abdomen: Soft, nontender, nondistended  Neurological: Alert and oriented 3. Moves all extremities 4 with equal strength. Cranial nerves II through XII grossly intact. Extremities: dressing in place to RLE - LUE with swelling to forearm, not particularly ttp   Data Reviewed: I have personally reviewed following labs and imaging studies  CBC: Recent Labs  Lab 02/28/24 1557 02/28/24 1627 02/29/24 0018 02/29/24 0644 02/29/24 1119 03/01/24 0740  WBC 6.3  --   --   --  5.8 4.4  NEUTROABS 4.0  --   --   --  4.4 2.4  HGB 6.4* 7.8* 6.2* 7.5* 7.5* 8.2*  HCT 20.8* 23.0* 20.6* 23.6* 23.6* 24.9*  MCV 76.2*  --   --   --  75.4* 73.7*  PLT 156  --   --   --  117* 115*    Basic Metabolic Panel: Recent Labs  Lab 02/28/24 1627 02/28/24 1759 02/29/24 0644 02/29/24 1119 03/01/24 0740  NA 136 137  --  138 132*  K  6.6* 4.1  --  3.5 3.0*  CL  --  102  --  105 100  CO2  --  19*  --  25 24  GLUCOSE  --  557*  --  240* 199*  BUN  --  7  --  <5* <5*  CREATININE  --  0.78  --  0.50* 0.50*  CALCIUM   --  9.3  --  8.4* 7.9*  MG  --   --  1.1*  --  1.2*  PHOS  --   --   --   --  2.9    GFR: Estimated Creatinine Clearance: 98.6 mL/min (Emauri Krygier) (by C-G formula based on SCr of 0.5 mg/dL (L)).  Liver Function Tests: Recent Labs  Lab 02/28/24 1759 02/29/24 1119 03/01/24 0740  AST 36  36 42*  ALT 20 18 18   ALKPHOS 277* 225* 235*  BILITOT 2.2* 2.6* 3.6*  PROT 9.1* 7.8 7.5  ALBUMIN 2.8* 2.2* 2.0*    CBG: Recent Labs  Lab 02/29/24 2124 03/01/24 0348 03/01/24 0816 03/01/24 1012 03/01/24 1148  GLUCAP 311* 225* 221* 247* 319*     Recent Results (from the past 240 hours)  Culture, blood (Routine X 2) w Reflex to ID Panel     Status: None (Preliminary result)   Collection Time: 02/29/24 12:19 AM   Specimen: BLOOD RIGHT HAND  Result Value Ref Range Status   Specimen Description BLOOD RIGHT HAND  Final   Special Requests   Final    BOTTLES DRAWN AEROBIC AND ANAEROBIC Blood Culture adequate volume   Culture   Final    NO GROWTH 1 DAY Performed at [facility], [address], [place], [place] [address]    Report Status PENDING  Incomplete  Culture, blood (Routine X 2) w Reflex to ID Panel     Status: None (Preliminary result)   Collection Time: 02/29/24  6:44 AM   Specimen: BLOOD RIGHT HAND  Result Value Ref Range Status   Specimen Description BLOOD RIGHT HAND  Final   Special Requests   Final    BOTTLES DRAWN AEROBIC AND ANAEROBIC Blood Culture adequate volume   Culture   Final    NO GROWTH 1 DAY Performed at [facility], [address], [place], [place] [address]    Report Status PENDING  Incomplete         Radiology Studies: EEG adult Result Date: 02/29/2024 Shelton Arlin KIDD, MD     02/29/2024  5:21 PM Patient Name: TREYVIN GLIDDEN MRN: [id number] Epilepsy Attending:  Arlin KIDD Shelton Referring Physician/Provider: Perri DELENA Meliton Mickey., MD Date: 02/29/2024 Duration: 24.44 mins Patient history: 53yo M with ams. EEG to evaluate for seizure Level of alertness: Awake AEDs during EEG study: LEV, versed  Technical aspects: This EEG study was done with scalp electrodes positioned according to the 10-20 International system of electrode placement. Electrical activity was reviewed with band pass filter of 1-70Hz , sensitivity of 7 uV/mm, display speed of 75mm/sec with Noal Abshier 60Hz  notched filter applied as appropriate. EEG data were recorded continuously and digitally stored.  Video monitoring was available and reviewed as appropriate. Description: The posterior dominant rhythm consists of 8 Hz activity of moderate voltage (25-35 uV) seen predominantly in posterior head regions, symmetric and reactive to eye opening and eye closing. Hyperventilation and photic stimulation were not performed.   IMPRESSION: This study is within normal limits. No seizures or epileptiform discharges were seen throughout the recording. Priyanka O Yadav   US  ASCITES (ABDOMEN LIMITED) Result Date: 02/29/2024 EXAM: LIMITED ABDOMINAL ULTRASOUND FOR ASCITES EVALUATION TECHNIQUE: Limited real-time sonography of all 4 quadrants of the abdomen was performed for evaluation of ascites. COMPARISON: None. CLINICAL HISTORY: 07[address] Cirrhosis (HCC) [id number]. Cirrhosis (HCC). FINDINGS: RIGHT UPPER QUADRANT: No ascites seen. LEFT UPPER QUADRANT: No ascites seen. RIGHT LOWER QUADRANT: No ascites seen. LEFT LOWER QUADRANT: No ascites seen. OTHER: Limited visualization of the rest of the abdomen demonstrates no acute abnormality. IMPRESSION: 1. No significant ascites. Electronically signed by: Katheleen Faes MD 02/29/2024 12:17 PM EDT RP Workstation: [id number]   MR BRAIN WO CONTRAST Result Date: 02/29/2024 CLINICAL DATA:  53 year old male status post seizure. EXAM: MRI HEAD WITHOUT CONTRAST TECHNIQUE: Multiplanar, multiecho pulse  sequences of the brain and surrounding structures were obtained without intravenous contrast. COMPARISON:  Head  CT yesterday.  Brain MRI 09/18/2020. FINDINGS: Brain: Stable cerebral volume since 2022. Intermittent mild motion artifact. No restricted diffusion to suggest acute infarction. No midline shift, mass effect, evidence of mass lesion, ventriculomegaly, extra-axial collection or acute intracranial hemorrhage. Cervicomedullary junction and pituitary are within normal limits. Chronic patchy T2 and FLAIR hyperintensity in the periventricular white matter is stable and nonspecific. No cortical encephalomalacia or chronic cerebral blood products identified. Intrinsic increased T1 signal in the bilateral globus pallidus (series 15, image 29). Deep gray nuclei, brainstem and cerebellum otherwise negative. Vascular: Major intracranial vascular flow voids are stable since 2022, preserved. Skull and upper cervical spine: Conspicuous DWI signal in the calvarium (series 5, image 79) but normal T1 marrow signal, and stable findings compared to 2022. Negative visible cervical spine. Sinuses/Orbits: Stable. Chronic right orbital floor fracture. Otherwise negative. Other: Mastoids are clear. Visible internal auditory structures appear normal. Negative visible scalp and face. IMPRESSION: 1. Intrinsic T1 hyperintensity in the bilateral globus pallidus, likely due to hepatic insufficiency, cirrhosis. Correlation with serum ammonia levels is recommended. 2. No superimposed acute intracranial abnormality. Chronic nonspecific cerebral volume loss and chronic white matter signal changes are stable since 2022 and nonspecific. Electronically Signed   By: VEAR Hurst M.D.   On: 02/29/2024 05:59   CT ANKLE RIGHT WO CONTRAST Result Date: 02/28/2024 CLINICAL DATA:  Right ankle and foot bone and soft tissue infection. Original injury was 12/18/2023. ORIF performed 01/06/2024 for Ishmeal Rorie bimalleolar fracture with dislocation. Fibular sideplate  fixation plating was subsequently removed 02/04/2024. He presents with worsening pain and Zamyia Gowell draining open wound laterally. EXAM: CT OF THE RIGHT ANKLE WITHOUT CONTRAST TECHNIQUE: Multidetector CT imaging of the right ankle was performed according to the standard protocol. Multiplanar CT image reconstructions were also generated. RADIATION DOSE REDUCTION: This exam was performed according to the departmental dose-optimization program which includes automated exposure control, adjustment of the mA and/or kV according to patient size and/or use of iterative reconstruction technique. COMPARISON:  Right ankle series from today, right ankle series 2 weeks postoperative 01/18/2024. There are no intervening studies. FINDINGS: Bones/Joint/Cartilage There is Jonan Seufert marked interval worsening since 01/18/2024. Evidence of erosive tibiotalar septic arthropathy and joint collapse, with fluid and bony/calcific debris in the joint space. Erosive changes extend over the talar fall dome and portions of the tibial plafond. Interval bone density loss is seen in the distal tibia, the talus, and the of fibular/malleolar fracture fragment. There is removed lateral fibular sideplate hardware, with increased lateral and slight cephalad displacement of the lateral malleolar fracture fragment up to nearly 1 shaft width laterally, with increased lateral subluxation of the talus of up to 1.5 cm with respect to the medial edge of the tibial plafond. Two medial malleolar screws remain in place, both of which demonstrate perihardware lucency consistent with loosening and both of which have backed out from the bone about 6 mm. There is increasing gapping between the medial malleolar fracture fragment in the parent bone now up to 4 mm as well. Metallic transsyndesmotic cable fixation plate remains medially and has also increasingly separated from the tibia. There are widespread periosteal amorphous/dystrophic calcifications around the distal tibia and  fibula, most if not all of which is probably periostitis related to osteomyelitis. Small scattered cortical erosions are noted over posterior malleolus, medial distal tibial metaphysis, and the lateral malleolar fracture fragment in addition to the tibiotalar joint surface erosions. There are no overt destructive changes to the calcaneus, midfoot and visualized proximal metatarsals. Ligaments Suboptimally assessed by  CT. Muscles and Tendons There is diffuse edema in the distal foreleg muscles which could be reactive or due to myositis. The plantar intrinsic foot musculature is unremarkable. Tendons are difficult to evaluate due to the degree of surrounding edema but are grossly intact as far as seen. No intramuscular abscess or focal fluid collection is seen, no intramuscular soft tissue gas. Soft tissues Diffuse moderate to severe edema. There soft tissue ulceration noted laterally at the level of the distal fibular fracture, the ulcer crater extending almost down to the bone. There is overlying bandaging, probably some packing material in it. There is Kingson Lohmeyer the more shallow surface ulceration medially at the level of the medial malleolar fracture. No underlying localizing fluid collection or soft tissue gas is seen. IMPRESSION: 1. Marked interval worsening since 01/18/2024. 2. New erosive tibiotalar arthropathy and joint collapse, almost certainly on Kada Friesen septic basis, with fluid and bony/calcific debris in the joint space. 3. Interval bone density loss in the distal tibia, talus, and lateral malleolar fracture fragment. 4. Increased lateral and slight cephalad displacement of the lateral malleolar fracture fragment up to nearly 1 shaft width laterally, with increased lateral subluxation of the talus. 5. Two medial malleolar screws remain, both with perihardware lucency consistent with loosening and both of which have backed out about 6 mm. 6. Metallic transsyndesmotic cable fixation plate remains medially and has also  increasingly separated from the tibia. 7. Widespread periosteal amorphous/dystrophic calcifications around the distal tibia and fibula, most if not all of which is probably periostitis related to osteomyelitis. Scattered tiny cortical erosions. 8. Diffuse edema in the distal foreleg muscles which could be reactive or due to myositis. 9. Soft tissue ulceration laterally at the level of the distal fibular fracture, the ulcer crater extending almost down to the bone. 10. More shallow surface ulceration medially at the level of the medial malleolar fracture. 11. No underlying localizing fluid collection or soft tissue gas is seen. Electronically Signed   By: Francis Quam M.D.   On: 02/28/2024 23:53   CT Head Wo Contrast Result Date: 02/28/2024 CLINICAL DATA:  Status post seizure. EXAM: CT HEAD WITHOUT CONTRAST TECHNIQUE: Contiguous axial images were obtained from the base of the skull through the vertex without intravenous contrast. RADIATION DOSE REDUCTION: This exam was performed according to the departmental dose-optimization program which includes automated exposure control, adjustment of the mA and/or kV according to patient size and/or use of iterative reconstruction technique. COMPARISON:  None Available. FINDINGS: Brain: There is generalized cerebral atrophy with widening of the extra-axial spaces and ventricular dilatation. There are areas of decreased attenuation within the white matter tracts of the supratentorial brain, consistent with microvascular disease changes. Vascular: No hyperdense vessel or unexpected calcification. Skull: Normal. Negative for fracture or focal lesion. Sinuses/Orbits: No acute finding. Other: None. IMPRESSION: 1. Generalized cerebral atrophy with chronic white matter small vessel ischemic changes. 2. No acute intracranial abnormality. Electronically Signed   By: Suzen Dials M.D.   On: 02/28/2024 18:15   DG Ankle Complete Right Result Date: 02/28/2024 CLINICAL DATA:   Evaluate for osteomyelitis. Right ankle surgery 1 month ago. Hardware removal 2 weeks ago. There is Unique Searfoss large knot on the medial ankle with an open wound on the lateral ankle. EXAM: RIGHT ANKLE - COMPLETE 3+ VIEW COMPARISON:  01/18/2024 FINDINGS: Healing fractures of the medial and lateral malleolus with residual fracture lines present possibly indicating nonunion. Interval removal of Merdith Boyd previous lateral plate and screw. Two fixation screws still demonstrated in the medial  malleolus. There is about 9 mm lateral displacement of the talus and of the distal fibular fragment with respect to the tibia. Displacement is new since the previous study which demonstrated near anatomic alignment. Soft tissue gas demonstrated over the medial and lateral malleolus suggesting soft tissue ulceration or infection. Periosteal reaction in bone loss demonstrated at the lateral malleolar fracture. This could indicate osteomyelitis or bone healing. Mild diffuse soft tissue swelling. IMPRESSION: 1. Previous medial and lateral malleolus fractures with residual fracture lines may indicate nonunion. 2. Interval removal of lateral plate and screw. 3. Interval lateral displacement of the talus and the distal lateral malleolar fragment with respect to the tibia. 4. Soft tissue ulceration and soft tissue gas. Underlying periosteal reaction in bone loss in the lateral malleolus could indicate osteomyelitis or bone healing. Electronically Signed   By: Elsie Gravely M.D.   On: 02/28/2024 17:11   DG Chest Port 1 View Result Date: 02/28/2024 CLINICAL DATA:  Sepsis. EXAM: PORTABLE CHEST 1 VIEW COMPARISON:  12/18/2023 FINDINGS: The heart size and mediastinal contours are within normal limits. Both lungs are clear. The visualized skeletal structures are unremarkable. IMPRESSION: No active disease. Electronically Signed   By: Norleen DELENA Kil M.D.   On: 02/28/2024 16:57        Scheduled Meds:  folic acid   1 mg Oral Daily   insulin  aspart  0-15  Units Subcutaneous TID WC   insulin  glargine  15 Units Subcutaneous Daily   lactulose   20 g Oral Daily   levETIRAcetam   1,000 mg Oral BID   LORazepam   0-4 mg Oral Q6H   Followed by   NOREEN ON 03/02/2024] LORazepam   0-4 mg Oral Q12H   multivitamin with minerals  1 tablet Oral Daily   pantoprazole  (PROTONIX ) IV  40 mg Intravenous Q12H   potassium chloride   40 mEq Oral Q4H   thiamine   100 mg Oral Daily   Or   thiamine   100 mg Intravenous Daily   Continuous Infusions:  ampicillin -sulbactam (UNASYN ) IV 3 g (03/01/24 0748)   magnesium  sulfate bolus IVPB 4 g (03/01/24 1345)   vancomycin  1,000 mg (03/01/24 1035)     LOS: 2 days    Time spent: over 30 min    Meliton Monte, MD [facility]   To contact the attending provider between 7A-7P or the covering provider during after hours 7P-7A, please log into the web site [email or web address] and access using universal [place] password for that web site. If you do not have the password, please call the hospital operator.  03/01/2024, 1:47 PM

## 2024-03-01 NOTE — Progress Notes (Signed)
 Date and time results received: 03/01/24 0348  Test: CBG Critical Value: 225  Name of Provider Notified: DOROTHA Kipper, NP  Orders Received- 2 units of insulin 

## 2024-03-02 ENCOUNTER — Encounter (HOSPITAL_COMMUNITY): Payer: Self-pay | Admitting: Gastroenterology

## 2024-03-02 ENCOUNTER — Telehealth: Payer: Self-pay

## 2024-03-02 ENCOUNTER — Ambulatory Visit: Payer: Self-pay | Admitting: Gastroenterology

## 2024-03-02 ENCOUNTER — Inpatient Hospital Stay (HOSPITAL_COMMUNITY)

## 2024-03-02 DIAGNOSIS — R739 Hyperglycemia, unspecified: Secondary | ICD-10-CM | POA: Diagnosis not present

## 2024-03-02 DIAGNOSIS — F1010 Alcohol abuse, uncomplicated: Secondary | ICD-10-CM

## 2024-03-02 DIAGNOSIS — M7989 Other specified soft tissue disorders: Secondary | ICD-10-CM | POA: Diagnosis not present

## 2024-03-02 DIAGNOSIS — R569 Unspecified convulsions: Secondary | ICD-10-CM | POA: Diagnosis not present

## 2024-03-02 DIAGNOSIS — M86171 Other acute osteomyelitis, right ankle and foot: Secondary | ICD-10-CM | POA: Diagnosis not present

## 2024-03-02 DIAGNOSIS — K250 Acute gastric ulcer with hemorrhage: Secondary | ICD-10-CM

## 2024-03-02 DIAGNOSIS — D649 Anemia, unspecified: Secondary | ICD-10-CM

## 2024-03-02 DIAGNOSIS — K7460 Unspecified cirrhosis of liver: Secondary | ICD-10-CM

## 2024-03-02 LAB — CBC WITH DIFFERENTIAL/PLATELET
Abs Immature Granulocytes: 0.01 K/uL (ref 0.00–0.07)
Basophils Absolute: 0 K/uL (ref 0.0–0.1)
Basophils Relative: 1 %
Eosinophils Absolute: 0.1 K/uL (ref 0.0–0.5)
Eosinophils Relative: 3 %
HCT: 24.4 % — ABNORMAL LOW (ref 39.0–52.0)
Hemoglobin: 7.8 g/dL — ABNORMAL LOW (ref 13.0–17.0)
Immature Granulocytes: 0 %
Lymphocytes Relative: 31 %
Lymphs Abs: 1.5 K/uL (ref 0.7–4.0)
MCH: 23.8 pg — ABNORMAL LOW (ref 26.0–34.0)
MCHC: 32 g/dL (ref 30.0–36.0)
MCV: 74.4 fL — ABNORMAL LOW (ref 80.0–100.0)
Monocytes Absolute: 0.7 K/uL (ref 0.1–1.0)
Monocytes Relative: 15 %
Neutro Abs: 2.5 K/uL (ref 1.7–7.7)
Neutrophils Relative %: 50 %
Platelets: 134 K/uL — ABNORMAL LOW (ref 150–400)
RBC: 3.28 MIL/uL — ABNORMAL LOW (ref 4.22–5.81)
RDW: 24.1 % — ABNORMAL HIGH (ref 11.5–15.5)
WBC: 4.8 K/uL (ref 4.0–10.5)
nRBC: 0 % (ref 0.0–0.2)

## 2024-03-02 LAB — GLUCOSE, CAPILLARY
Glucose-Capillary: 294 mg/dL — ABNORMAL HIGH (ref 70–99)
Glucose-Capillary: 302 mg/dL — ABNORMAL HIGH (ref 70–99)
Glucose-Capillary: 296 mg/dL — ABNORMAL HIGH (ref 70–99)
Glucose-Capillary: 300 mg/dL — ABNORMAL HIGH (ref 70–99)

## 2024-03-02 LAB — COMPREHENSIVE METABOLIC PANEL WITH GFR
ALT: 20 U/L (ref 0–44)
AST: 47 U/L — ABNORMAL HIGH (ref 15–41)
Albumin: 2.1 g/dL — ABNORMAL LOW (ref 3.5–5.0)
Alkaline Phosphatase: 237 U/L — ABNORMAL HIGH (ref 38–126)
Anion gap: 6 (ref 5–15)
BUN: <5 mg/dL — ABNORMAL LOW (ref 6–20)
CO2: 24 mmol/L (ref 22–32)
Calcium: 8.3 mg/dL — ABNORMAL LOW (ref 8.9–10.3)
Chloride: 100 mmol/L (ref 98–111)
Creatinine, Ser: 0.51 mg/dL — ABNORMAL LOW (ref 0.61–1.24)
GFR, Estimated: >60 mL/min (ref >60)
Glucose, Bld: 274 mg/dL — ABNORMAL HIGH (ref 70–99)
Potassium: 4.3 mmol/L (ref 3.5–5.1)
Sodium: 130 mmol/L — ABNORMAL LOW (ref 135–145)
Total Bilirubin: 2.3 mg/dL — ABNORMAL HIGH (ref 0.0–1.2)
Total Protein: 7.4 g/dL (ref 6.5–8.1)

## 2024-03-02 LAB — MAGNESIUM: Magnesium: 1.5 mg/dL — ABNORMAL LOW (ref 1.7–2.4)

## 2024-03-02 LAB — PHOSPHORUS: Phosphorus: 2.6 mg/dL (ref 2.5–4.6)

## 2024-03-02 LAB — SURGICAL PATHOLOGY

## 2024-03-02 MED ORDER — APIXABAN 5 MG PO TABS: 5.0000 mg | ORAL_TABLET | Freq: Two times a day (BID) | ORAL | Status: DC

## 2024-03-02 MED ORDER — INSULIN GLARGINE 100 UNIT/ML ~~LOC~~ SOLN
25.0000 [IU] | Freq: Every day | SUBCUTANEOUS | Status: DC
  Administered 2024-03-03 – 2024-03-04 (×2): 25 [IU] via SUBCUTANEOUS
  Filled 2024-03-02 (×2): qty 0.25

## 2024-03-02 MED ORDER — MAGNESIUM SULFATE 4 GM/100ML IV SOLN
4.0000 g | Freq: Once | INTRAVENOUS | Status: AC
  Administered 2024-03-02: 4 g via INTRAVENOUS
  Filled 2024-03-02: qty 100

## 2024-03-02 MED ORDER — APIXABAN 2.5 MG PO TABS
2.5000 mg | ORAL_TABLET | Freq: Two times a day (BID) | ORAL | Status: DC
  Administered 2024-03-02 – 2024-03-04 (×5): 2.5 mg via ORAL
  Filled 2024-03-02 (×5): qty 1

## 2024-03-02 MED ORDER — INSULIN GLARGINE 100 UNIT/ML ~~LOC~~ SOLN
10.0000 [IU] | Freq: Once | SUBCUTANEOUS | Status: AC
  Administered 2024-03-02: 10 [IU] via SUBCUTANEOUS
  Filled 2024-03-02: qty 0.1

## 2024-03-02 NOTE — Telephone Encounter (Signed)
The pt has been advised 

## 2024-03-02 NOTE — Evaluation (Signed)
 Physical Therapy Evaluation Patient Details Name: Gary Mitchell MRN: [id number] DOB: 1971-05-01 Today's Date: 03/02/2024  History of Present Illness  Pt is 53 yo male presents to [facility] ED on 02/28/24 with c/o seizure, AMS, and headache. S/p EGD 8/25. PMH: Infected hardware removal 7/30, R ankle fx ORIF 01/06/24, EtOH abuse, hepatitis, seizure disorder, DM2, neuropathy. Pancreatitis, cirrhosis, coagulopathy, depression  Clinical Impression  Pt presents with decreased functional mobility due to decreased ROM and strength in RLE, and NWB in LUE. Pt to benefit from acute PT to address deficits. Pt able to perform transfers using RW while adhering to NWB in LUE. LUE used for steering RW only, NWB confirmed post-session as no formal orders in chart. Would recommend a L platform RW vs no AD for gait next session. Pt able to WB in RLE for sit to stands. Pt to benefit from further PT to address deficits. PT to progress mobility as tolerated, and will continue to follow acutely.      If plan is discharge home, recommend the following: A little help with walking and/or transfers;A little help with bathing/dressing/bathroom;Assist for transportation   Can travel by private Programmer, multimedia cushion (measurements PT);Wheelchair (measurements PT) (Pt states that his WC is broken. Recommend someone take a look)  Recommendations for Other Services       Functional Status Assessment Patient has had a recent decline in their functional status and demonstrates the ability to make significant improvements in function in a reasonable and predictable amount of time.     Precautions / Restrictions Precautions Precautions: Fall Restrictions Weight Bearing Restrictions Per Provider Order: Yes LUE Weight Bearing Per Provider Order: Non weight bearing (Confirmed after session with Dr)      Mobility  Bed Mobility Overal bed mobility: Needs Assistance Bed Mobility: Supine to  Sit     Supine to sit: HOB elevated, Supervision     General bed mobility comments: For safety    Transfers Overall transfer level: Needs assistance Equipment used: Rolling walker (2 wheels) Transfers: Sit to/from Stand, Bed to chair/wheelchair/BSC Sit to Stand: Min assist   Step pivot transfers: Min assist       General transfer comment: Sit to stand x2 (from EOB and recliner). Able to tolerate weight on RLE. Assist for cues and to minimally WB through L wrist to RW. Pt used LUE to steer RW only, WB through RUE on RW to hop-step on LLE.    Ambulation/Gait Ambulation/Gait assistance: Supervision Gait Distance (Feet): 5 Feet Assistive device: Rolling walker (2 wheels) Gait Pattern/deviations: Decreased stance time - right, Decreased step length - right       General Gait Details: Hop step using LLE to recliner.  Stairs            Wheelchair Mobility     Tilt Bed    Modified Rankin (Stroke Patients Only)       Balance Overall balance assessment: Needs assistance Sitting-balance support: Feet supported Sitting balance-Leahy Scale: Good     Standing balance support: During functional activity, Single extremity supported, Reliant on assistive device for balance Standing balance-Leahy Scale: Good Standing balance comment: Able stand statically without AD. Used RW for ambulation                             Pertinent Vitals/Pain Pain Assessment Pain Assessment: Faces Faces Pain Scale: Hurts even more Pain Location: R ankle Pain  Descriptors / Indicators: Aching Pain Intervention(s): Monitored during session, Limited activity within patient's tolerance    Home Living Family/patient expects to be discharged to:: Private residence Living Arrangements: Other relatives Available Help at Discharge: Family Type of Home: Apartment (Temporary stay until pt can get his own place again) Home Access: Stairs to enter Entrance Stairs-Rails:  None Entrance Stairs-Number of Steps: 3   Home Layout: One level Home Equipment: Shower seat;Wheelchair - Forensic psychologist (2 wheels) Additional Comments: Pt reports wheelchair is broken    Prior Function Prior Level of Function : Independent/Modified Independent                     Extremity/Trunk Assessment   Upper Extremity Assessment Upper Extremity Assessment: LUE deficits/detail LUE Deficits / Details: NWB due to ulnar fx    Lower Extremity Assessment Lower Extremity Assessment: RLE deficits/detail RLE Deficits / Details: R ankle fx. WBAT    Cervical / Trunk Assessment Cervical / Trunk Assessment: Normal  Communication   Communication Communication: No apparent difficulties    Cognition Arousal: Alert Behavior During Therapy: WFL for tasks assessed/performed   PT - Cognitive impairments: No apparent impairments                         Following commands: Intact       Cueing Cueing Techniques: Verbal cues, Gestural cues     General Comments General comments (skin integrity, edema, etc.): VSS on RA    Exercises     Assessment/Plan    PT Assessment Patient needs continued PT services  PT Problem List Decreased balance;Decreased mobility;Decreased strength;Decreased knowledge of use of DME       PT Treatment Interventions Gait training;Stair training;Functional mobility training;Therapeutic activities;DME instruction    PT Goals (Current goals can be found in the Care Plan section)  Acute Rehab PT Goals Patient Stated Goal: To get better PT Goal Formulation: With patient Time For Goal Achievement: 03/16/24 Potential to Achieve Goals: Good    Frequency Min 3X/week     Co-evaluation               AM-PAC PT 6 Clicks Mobility  Outcome Measure Help needed turning from your back to your side while in a flat bed without using bedrails?: A Little Help needed moving from lying on your back to sitting on the side of a flat  bed without using bedrails?: None Help needed moving to and from a bed to a chair (including a wheelchair)?: A Little Help needed standing up from a chair using your arms (e.g., wheelchair or bedside chair)?: None Help needed to walk in hospital room?: A Little Help needed climbing 3-5 steps with a railing? : A Lot 6 Click Score: 19    End of Session   Activity Tolerance: Patient tolerated treatment well Patient left: in chair;with call bell/phone within reach;with chair alarm set Nurse Communication: Mobility status PT Visit Diagnosis: Other abnormalities of gait and mobility (R26.89);Muscle weakness (generalized) (M62.81)    Time: 9056-8994 PT Time Calculation (min) (ACUTE ONLY): 22 min   Charges:   PT Evaluation $PT Eval Low Complexity: 1 Low   PT General Charges $$ ACUTE PT VISIT: 1 Visit         Quintin Campi, SPT  Acute Rehab  [phone]   Quintin Campi 03/02/2024, 1:25 PM

## 2024-03-02 NOTE — Progress Notes (Signed)
 PROGRESS NOTE    Gary Mitchell  FMW:[id number] DOB: 1970-08-06 DOA: 02/28/2024 PCP: Delbert Clam, MD  Chief Complaint  Patient presents with   Hyperglycemia   Wound Infection    Brief Narrative:   53 yo with hx etoh abuse, cirrhosis, atrial fib on eliquis , T2DM and multiple other medical issues with recent closed R ankle fracture s/p ORIF 01/06/2024.  He was admitted by orthopedics with infected hardware 7/30 and had surgery per ortho with removal of hardware.  He was discharged on 8/1.  He's returned in the setting of being found down after Gary Mitchell suspected seizure and right lower extremity swelling/infection.    Dr. Harden recommended transtibial amputation, he's currently refusing.  ID has been consulted for abx recs.  GI was consulted for his anemia and he's s/p EGD with an oozing gastric ulcer (clipped) and grade 1 varices (no stigmata bleeding).    He's currently homeless.  Therapy evals are pending.  Discharge is pending safe discharge plan, ID eval.    Assessment & Plan:   Principal Problem:   AMS (altered mental status) Active Problems:   Seizure (HCC)   Subacute osteomyelitis of right ankle (HCC)   Acute gastric ulcer with hemorrhage   Hyperglycemia  Right Lower Extremity Infection  Infected Hardware  Septic Arthritis  Osteomyelitis  Myositis  CT with interval worsening since 7/113/2025 - erosive tibiotalar arthropathy and joint collapse.  Interval bone density loss in distal tibia, talus, and lateral malleolar fracture fragment.  Increased lateral and slight cephalad displacement of the lateral malleolar fracture fragment with increased lateral subluxation of the talus.  Medial malleolar screws with perihardware lucency c/w loosening (both backed out about 6 mm).  Metallic transsyndesmotic cable fixation plate remains medially and has increasingly separated from the tibia.  Widespread periosteal amorphous/dystrophic calcifications around the distal tibia and fibula (most  thought to be periostitis related to osteomyelitis).  Diffuse edema in the distal foreleg muscles.  Soft tissue ulceration laterally at level of distal fibular fracture.  Shallow surface ulceration medially at the level of the medial malleolar fracture.  (See full report) S/p surgery 7/30 with operative cultures showing MRSA and VRE  Blood cultures NGx2 Dr. Harden recommending transtibial amputation, he's refusing this at this time Will ask for ID assistance Continue vancomycin  and unasyn    Seizures Acute Metabolic Encephalopathy Found down, apparently per EDP, eye witnesses noted multiple seizures Reported confusion for EMS, but back to normal when seen by EDP  MRI brain with T1 hyperintensity in the bilateral globus pallidus (due to hepatic insufficiency, cirrhosis) - no acute abnormality Follow EEG - no seizures or epileptiform discharges Continue his home keppra  - neurology recommending increasing to 1000 mg BID, will call if concerns PT/OT  Per [place] statutes, patients with seizures are not allowed to drive until  they have been seizure-free for six months. Use caution when using heavy equipment or power tools. Avoid working on ladders or at heights. Take showers instead of baths. Ensure the water  temperature is not too high on the home water  heater. Do not go swimming alone. When caring for infants or small children, sit down when holding, feeding, or changing them to minimize risk of injury to the child in the event you have Kashana Breach seizure. Also, Maintain good sleep hygiene. Avoid alcohol.  Anemia Hb 6.4 at presentation (was 9.9 at the end of July) No reported GI bleeding, but notable hx cirrhosis S/p 1 unit pRBC Hb relatively stable today, trend GI c/s ---  S/p EGD 8/25 with grade 1 esophageal varices, erythematous mucosa in the gastric fundus and body (biopsied), oozing gastric ulcer (clip placed).  Biopsies pending.  PPI BID.  Ok to resume eliquis .  Follow pathology.   Will  follow iron  wnl, b12 wnl, folate wnl, ferritin (low)  Atrial Fibrillation Will resume eliquis  today - watch H/H closely  Etoh Abuse [place]   Cirrhosis Elevated ammonia, but alert and oriented - no asterixis Will continue daily lactulose   T2DM 25 units basal SSI  Anion Gap Metabolic Acidosis resolved  Hypomagnesemia Replace and follow   Left Arm Swelling Distal Ulnar Metaphyseal Fracture Discussed with orthopedics, plan for sugar tong splint, NWB, and follow with Dr. Colman in 1-2 weeks  Homelessness TOC    DVT prophylaxis: SCD Code Status: full Family Communication: none Disposition:   Status is: Inpatient Remains inpatient appropriate because: need for continued inpatient care   Consultants:  GI Orthopedics/Duda  Procedures:  EGD   Antimicrobials:  Anti-infectives (From admission, onward)    Start     Dose/Rate Route Frequency Ordered Stop   02/29/24 2200  vancomycin  (VANCOCIN ) IVPB 1000 mg/200 mL premix        1,000 mg 200 mL/hr over 60 Minutes Intravenous Every 12 hours 02/29/24 1120     02/29/24 1400  Ampicillin -Sulbactam (UNASYN ) 3 g in sodium chloride  0.9 % 100 mL IVPB        3 g 200 mL/hr over 30 Minutes Intravenous Every 6 hours 02/29/24 1118     02/29/24 1000  vancomycin  (VANCOCIN ) IVPB 1000 mg/200 mL premix  Status:  Discontinued        1,000 mg 200 mL/hr over 60 Minutes Intravenous 2 times daily 02/28/24 2242 02/29/24 1118   02/28/24 2245  meropenem  (MERREM ) 1 g in sodium chloride  0.9 % 100 mL IVPB  Status:  Discontinued        1 g 200 mL/hr over 30 Minutes Intravenous Every 8 hours 02/28/24 2240 02/29/24 1102   02/28/24 2245  vancomycin  (VANCOREADY) IVPB 1500 mg/300 mL        1,500 mg 150 mL/hr over 120 Minutes Intravenous  Once 02/28/24 2240 02/29/24 0250       Subjective: No new complaints  Objective: Vitals:   03/01/24 1946 03/01/24 2318 03/02/24 0252 03/02/24 0749  BP: (!) 122/91 129/82 134/78 131/85  Pulse: 99 96 98 93   Resp: 11 20    Temp: 98.1 F (36.7 C) 98.7 F (37.1 C) 98.3 F (36.8 C) 98.9 F (37.2 C)  TempSrc: Oral Oral Oral Oral  SpO2: 100% 97% 99% 99%  Weight:      Height:        Intake/Output Summary (Last 24 hours) at 03/02/2024 1058 Last data filed at 03/02/2024 0900 Gross per 24 hour  Intake 1604.53 ml  Output 3700 ml  Net -2095.47 ml   Filed Weights   02/28/24 2200 02/29/24 0105 03/01/24 0821  Weight: 64 kg 65 kg 65 kg    Examination:  General: No acute distress. Cardiovascular: RRR Lungs: unlabored Neurological: Alert and oriented 3. Moves all extremities 4 with equal strength. Cranial nerves II through XII grossly intact. Extremities: RLE in dressing, LUE swelling    Data Reviewed: I have personally reviewed following labs and imaging studies  CBC: Recent Labs  Lab 02/28/24 1557 02/28/24 1627 02/29/24 0018 02/29/24 0644 02/29/24 1119 03/01/24 0740 03/02/24 0454  WBC 6.3  --   --   --  5.8 4.4 4.8  NEUTROABS 4.0  --   --   --  4.4 2.4 2.5  HGB 6.4*   < > 6.2* 7.5* 7.5* 8.2* 7.8*  HCT 20.8*   < > 20.6* 23.6* 23.6* 24.9* 24.4*  MCV 76.2*  --   --   --  75.4* 73.7* 74.4*  PLT 156  --   --   --  117* 115* 134*   < > = values in this interval not displayed.    Basic Metabolic Panel: Recent Labs  Lab 02/28/24 1627 02/28/24 1759 02/29/24 0644 02/29/24 1119 03/01/24 0740 03/02/24 0454  NA 136 137  --  138 132* 130*  K 6.6* 4.1  --  3.5 3.0* 4.3  CL  --  102  --  105 100 100  CO2  --  19*  --  25 24 24   GLUCOSE  --  557*  --  240* 199* 274*  BUN  --  7  --  <5* <5* <5*  CREATININE  --  0.78  --  0.50* 0.50* 0.51*  CALCIUM   --  9.3  --  8.4* 7.9* 8.3*  MG  --   --  1.1*  --  1.2* 1.5*  PHOS  --   --   --   --  2.9 2.6    GFR: Estimated Creatinine Clearance: 98.6 mL/min (Jarmar Rousseau) (by C-G formula based on SCr of 0.51 mg/dL (L)).  Liver Function Tests: Recent Labs  Lab 02/28/24 1759 02/29/24 1119 03/01/24 0740 03/02/24 0454  AST 36 36 42* 47*  ALT  20 18 18 20   ALKPHOS 277* 225* 235* 237*  BILITOT 2.2* 2.6* 3.6* 2.3*  PROT 9.1* 7.8 7.5 7.4  ALBUMIN 2.8* 2.2* 2.0* 2.1*    CBG: Recent Labs  Lab 03/01/24 1012 03/01/24 1148 03/01/24 1633 03/01/24 2122 03/02/24 0750  GLUCAP 247* 319* 307* 313* 294*     Recent Results (from the past 240 hours)  Culture, blood (Routine X 2) w Reflex to ID Panel     Status: None (Preliminary result)   Collection Time: 02/29/24 12:19 AM   Specimen: BLOOD RIGHT HAND  Result Value Ref Range Status   Specimen Description BLOOD RIGHT HAND  Final   Special Requests   Final    BOTTLES DRAWN AEROBIC AND ANAEROBIC Blood Culture adequate volume   Culture   Final    NO GROWTH 2 DAYS Performed at [facility], [address], [place], [place] [address]    Report Status PENDING  Incomplete  Culture, blood (Routine X 2) w Reflex to ID Panel     Status: None (Preliminary result)   Collection Time: 02/29/24  6:44 AM   Specimen: BLOOD RIGHT HAND  Result Value Ref Range Status   Specimen Description BLOOD RIGHT HAND  Final   Special Requests   Final    BOTTLES DRAWN AEROBIC AND ANAEROBIC Blood Culture adequate volume   Culture   Final    NO GROWTH 2 DAYS Performed at [facility], [address] [place], [place] [address]    Report Status PENDING  Incomplete         Radiology Studies: DG Forearm Left Result Date: 03/01/2024 CLINICAL DATA:  Swelling, recent injury. EXAM: LEFT FOREARM - 2 VIEW COMPARISON:  None Available. FINDINGS: Oblique mildly displaced distal ulnar metaphyseal fracture. No definite intra-articular involvement. Distal radioulnar assessment is limited on provided views. Proximal forearm is intact. Elbow alignment is maintained. Soft tissue edema distally. IMPRESSION: Oblique mildly displaced distal ulnar metaphyseal fracture. Electronically Signed   By: Andrea  Sanford M.D.   On: 03/01/2024 14:18   EEG adult Result Date: 02/29/2024 Shelton Arlin KIDD, MD     02/29/2024   5:21 PM Patient Name: JACKOB CROOKSTON MRN: [id number] Epilepsy Attending: Arlin KIDD Shelton Referring Physician/Provider: Perri DELENA Meliton Mickey., MD Date: 02/29/2024 Duration: 24.44 mins Patient history: 53yo M with ams. EEG to evaluate for seizure Level of alertness: Awake AEDs during EEG study: LEV, versed  Technical aspects: This EEG study was done with scalp electrodes positioned according to the 10-20 International system of electrode placement. Electrical activity was reviewed with band pass filter of 1-70Hz , sensitivity of 7 uV/mm, display speed of 23mm/sec with Zechariah Bissonnette 60Hz  notched filter applied as appropriate. EEG data were recorded continuously and digitally stored.  Video monitoring was available and reviewed as appropriate. Description: The posterior dominant rhythm consists of 8 Hz activity of moderate voltage (25-35 uV) seen predominantly in posterior head regions, symmetric and reactive to eye opening and eye closing. Hyperventilation and photic stimulation were not performed.   IMPRESSION: This study is within normal limits. No seizures or epileptiform discharges were seen throughout the recording. Priyanka O Yadav        Scheduled Meds:  folic acid   1 mg Oral Daily   insulin  aspart  0-15 Units Subcutaneous TID WC   insulin  glargine  15 Units Subcutaneous Daily   lactulose   20 g Oral Daily   levETIRAcetam   1,000 mg Oral BID   LORazepam   0-4 mg Oral Q12H   multivitamin with minerals  1 tablet Oral Daily   pantoprazole  (PROTONIX ) IV  40 mg Intravenous Q12H   thiamine   100 mg Oral Daily   Or   thiamine   100 mg Intravenous Daily   Continuous Infusions:  ampicillin -sulbactam (UNASYN ) IV 3 g (03/02/24 0811)   vancomycin  1,000 mg (03/01/24 2105)     LOS: 3 days    Time spent: over 30 min    Meliton Perri, MD [facility]   To contact the attending provider between 7A-7P or the covering provider during after hours 7P-7A, please log into the web site [email or web address] and access  using universal [place] password for that web site. If you do not have the password, please call the hospital operator.  03/02/2024, 10:58 AM

## 2024-03-02 NOTE — Progress Notes (Signed)
 Pharmacy Antibiotic Note  Gary Mitchell is a 53 y.o. male admitted on 02/28/2024 with  hardware infection .  Pharmacy has been consulted for vancomycin  and Unasyn  dosing.  Patient declines amputation.  Renal function stable, afebrile, WBC WNL.  Plan: Continue vanc 1gm IV Q12H Continue Unasyn  3gm IV Q6H Monitor renal fxn, clinical progress, vanc levels soon if still on therapy F/u ID consult  Height: 5' 6 (167.6 cm) Weight: 65 kg (143 lb 4.8 oz) IBW/kg (Calculated) : 63.8  Temp (24hrs), Avg:98.1 F (36.7 C), Min:97.5 F (36.4 C), Max:98.7 F (37.1 C)  Recent Labs  Lab 02/28/24 1557 02/28/24 1627 02/28/24 1759 02/28/24 2035 02/29/24 0020 02/29/24 1119 03/01/24 0740 03/02/24 0454  WBC 6.3  --   --   --   --  5.8 4.4 4.8  CREATININE  --   --  0.78  --   --  0.50* 0.50* 0.51*  LATICACIDVEN  --  7.3*  --  4.6* 3.0*  --   --   --     Estimated Creatinine Clearance: 98.6 mL/min (A) (by C-G formula based on SCr of 0.51 mg/dL (L)).    Allergies  Allergen Reactions   Iohexol  Other (See Comments)    Unknown reaction at 27 days old Mom at bedside reported patient was given injections of iohexol  in foot and resulted in hole in foot or foot got infected   Nsaids Other (See Comments)    Patient said he was told he could take Tylenol  ONLY.   Vanc 8/23 >> Unasyn  8/24 >> Merrem  8/23 >> 8/24   8/24 BCx - NGTD  Gary Mitchell D. [place], PharmD, BCPS, BCCCP 03/02/2024, 8:22 AM

## 2024-03-02 NOTE — Telephone Encounter (Signed)
 Mansouraty, Aloha Raddle., MD  Ever Greig RAMAN, PA-C; Anitra Odetta CROME, RN; Leigh, Elspeth SQUIBB, MD Team, He was not in the hospital when he was supposed to come for his procedure.  His mother was going to be his ride and could not locate him that day and she actually showed up to the hospital. If he is rescheduled he needs to be seen in clinic before by one of the APPs.   If he misses one more clinic or procedure date he will need to be dismissed from the practice. You can put the EUS date out into October so all of this can occur. Inpatient team please reiterate this to the patient about dismissal possibility. Thanks GM

## 2024-03-02 NOTE — Telephone Encounter (Signed)
-----   Message from [name] sent at 03/02/2024 11:04 AM EDT ----- Regarding: reschedule EUS This patient missed a recent EUS appointment with Dr. Wilhelmenia due to being hospitalized with infected hardware in his foot.  We Are currently seeing him for anemia with finding of a small gastric ulcer on EGD.  Has underlying EtOH related cirrhosis and a known pancreatic cyst for which he was scheduled for the EUS due to elevated CA 19-9  Gary Mitchell, can you please work on getting him rescheduled for EUS. Expect he will be in the hospital for another couple of days  Enck you

## 2024-03-02 NOTE — Telephone Encounter (Signed)
 10/3 at 310 pm appt made to see Dr Wilhelmenia to discuss

## 2024-03-02 NOTE — Consult Note (Signed)
 [facility]    Date of Admission:  02/28/2024     Reason for Consult: osteomyelitis    Referring Provider: Meliton      Abx: Vanc Amp/sulb  Outpatient doxy/augmentin         Assessment: 53 yo male etoh abuse, liver cirrhosis, right ankle hardware complicated by om/infection failing oral abx readmitted 8/23   He was admitted 7/30 for initial infection visit at [facility] cone underwent lateral screws removal and sent out on doxy/augmentin  e gallinarum (s amp; r vanc), and mrsa (S doxy). Mixed organisms not worked up  He returns with now medial side skin break down and ct showing significantly worsening imaging  He had refused amputation which Dr Harden had advised. From what ct shows I agree I am not sure this is salvageable    Plan: Can continue doxy/augmentin  for another 6 weeks; if continued failure there is no other option outside of surgery Maintain contact isolation precaution ID clinic f/u 9/18 with me If he agrees to surgery bka, then no further abx needed and no need for id f/u in that case Id will sign off Discussed with primary team     ------------------------------------------------ Principal Problem:   AMS (altered mental status) Active Problems:   Seizure (HCC)   Subacute osteomyelitis of right ankle (HCC)   Acute gastric ulcer with hemorrhage   Hyperglycemia    HPI: Gary Mitchell is a 53 y.o. male here with hardware associate right ankle om/septic arthritis  Patient came on 02/04/24 and had partial hardware removal; cx reviewed Given doxy/augmentin  short course on discharge  Readmitted 8/26 with worsening wound and now medial side where hardware remains breaking open No f/c Afebrile No leukocytosis Ct showed likely medial hardware involvement as well  Dr Harden reengaged but patient refused bka      Family History  Problem Relation Age of Onset   Diabetes Mellitus II Mother    Colon cancer Neg Hx     Esophageal cancer Neg Hx    Inflammatory bowel disease Neg Hx    Liver disease Neg Hx    Pancreatic cancer Neg Hx    Rectal cancer Neg Hx    Stomach cancer Neg Hx     Social History   Tobacco Use   Smoking status: Former    Current packs/day: 0.00    Average packs/day: 0.1 packs/day for 25.0 years (2.5 ttl pk-yrs)    Types: Cigarettes    Start date: 64    Quit date: 2019    Years since quitting: 6.6    Passive exposure: Past   Smokeless tobacco: Never   Tobacco comments:    Former smoker 08/08/22  Vaping Use   Vaping status: Never Used  Substance Use Topics   Alcohol use: Not Currently   Drug use: Not Currently    Allergies  Allergen Reactions   Iohexol  Other (See Comments)    Unknown reaction at 36 days old Mom at bedside reported patient was given injections of iohexol  in foot and resulted in hole in foot or foot got infected   Nsaids Other (See Comments)    Patient said he was told he could take Tylenol  ONLY.    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   Seizures (HCC)    Seizures (HCC)    Thrombocytopenia (HCC)        Scheduled Meds:  apixaban   2.5 mg Oral BID   folic acid   1 mg Oral Daily   insulin  aspart  0-15 Units Subcutaneous TID WC   [START ON 03/03/2024] insulin  glargine  25  Units Subcutaneous Daily   lactulose   20 g Oral Daily   levETIRAcetam   1,000 mg Oral BID   LORazepam   0-4 mg Oral Q12H   multivitamin with minerals  1 tablet Oral Daily   pantoprazole  (PROTONIX ) IV  40 mg Intravenous Q12H   thiamine   100 mg Oral Daily   Or   thiamine   100 mg Intravenous Daily   Continuous Infusions:  ampicillin -sulbactam (UNASYN ) IV 3 g (03/02/24 1502)   vancomycin  1,000 mg (03/02/24 1113)   PRN Meds:.acetaminophen , LORazepam  **OR** LORazepam , ondansetron  **OR** ondansetron  (ZOFRAN ) IV   OBJECTIVE: Blood pressure (!) 142/96, pulse 93, temperature 98.9 F (37.2 C), temperature source Oral, resp. rate 11, height 5' 6 (1.676 m), weight 65 kg, SpO2 99%.  Physical Exam  General/constitutional: no distress, pleasant HEENT: Normocephalic, PER, Conj Clear, EOMI, Oropharynx clear Neck supple CV: rrr no mrg Lungs: clear to auscultation, normal respiratory effort Abd: Soft, Nontender Ext: no edema Skin: No Rash Neuro: nonfocal MSK: right ankle swollen; dressign with strikethrough laterall; no proximal cellulitis changes     Lab Results Lab Results  Component Value Date   WBC 4.8 03/02/2024   HGB 7.8 (L) 03/02/2024   HCT 24.4 (L) 03/02/2024   MCV 74.4 (L) 03/02/2024   PLT 134 (L) 03/02/2024    Lab Results  Component Value Date   CREATININE 0.51 (L) 03/02/2024   BUN <5 (L) 03/02/2024   NA 130 (L) 03/02/2024   K 4.3 03/02/2024   CL 100 03/02/2024   CO2 24 03/02/2024    Lab Results  Component Value Date   ALT 20 03/02/2024   AST 47 (H) 03/02/2024   ALKPHOS 237 (H) 03/02/2024   BILITOT 2.3 (H) 03/02/2024      Microbiology: Recent Results (from the past 240 hours)  Culture, blood (Routine X 2) w Reflex to ID Panel     Status: None (Preliminary result)   Collection Time: 02/29/24 12:19 AM   Specimen: BLOOD RIGHT HAND  Result Value Ref Range Status   Specimen Description BLOOD RIGHT HAND  Final   Special Requests   Final    BOTTLES DRAWN AEROBIC  AND ANAEROBIC Blood Culture adequate volume   Culture   Final    NO GROWTH 2 DAYS Performed at [facility], [address], [place], [place] [address]    Report Status PENDING  Incomplete  Culture, blood (Routine X 2) w Reflex to ID Panel     Status: None (Preliminary result)   Collection Time: 02/29/24  6:44 AM   Specimen: BLOOD RIGHT HAND  Result Value  Ref Range Status   Specimen Description BLOOD RIGHT HAND  Final   Special Requests   Final    BOTTLES DRAWN AEROBIC AND ANAEROBIC Blood Culture adequate volume   Culture   Final    NO GROWTH 2 DAYS Performed at [facility], [address], [place], [place] [address]    Report Status PENDING  Incomplete     Serology:    Imaging: If present, new imagings (plain films, ct scans, and mri) have been personally visualized and interpreted; radiology reports have been reviewed. Decision making incorporated into the Impression / Recommendations.  8/23 ct right ankle no contrast 1. Marked interval worsening since 01/18/2024. 2. New erosive tibiotalar arthropathy and joint collapse, almost certainly on a septic basis, with fluid and bony/calcific debris in the joint space. 3. Interval bone density loss in the distal tibia, talus, and lateral malleolar fracture fragment. 4. Increased lateral and slight cephalad displacement of the lateral malleolar fracture fragment up to nearly 1 shaft width laterally, with increased lateral subluxation of the talus. 5. Two medial malleolar screws remain, both with perihardware lucency consistent with loosening and both of which have backed out about 6 mm. 6. Metallic transsyndesmotic cable fixation plate remains medially and has also increasingly separated from the tibia. 7. Widespread periosteal amorphous/dystrophic calcifications around the distal tibia and fibula, most if not all of which is probably periostitis related to osteomyelitis. Scattered tiny cortical erosions. 8.  Diffuse edema in the distal foreleg muscles which could be reactive or due to myositis. 9. Soft tissue ulceration laterally at the level of the distal fibular fracture, the ulcer crater extending almost down to the bone. 10. More shallow surface ulceration medially at the level of the medial malleolar fracture. 11. No underlying localizing fluid collection or soft tissue gas is seen.  8/24 mri brain 1. Intrinsic T1 hyperintensity in the bilateral globus pallidus, likely due to hepatic insufficiency, cirrhosis. Correlation with serum ammonia levels is recommended. 2. No superimposed acute intracranial abnormality. Chronic nonspecific cerebral volume loss and chronic white matter signal changes are stable since 2022 and nonspecific.  8/24 liver u/s IMPRESSION: 1. No significant ascites.    Constance ONEIDA Passer, MD [facility] [phone] pager    03/02/2024, 4:19 PM

## 2024-03-02 NOTE — Progress Notes (Signed)
 LUE venous exam is completed. Trudy Kory, RVT

## 2024-03-02 NOTE — Progress Notes (Signed)
 Orthopedic Tech Progress Note Patient Details:  Gary Mitchell [birth year] [id number]  Ortho Devices Type of Ortho Device: Arm sling, Sugartong splint Ortho Device/Splint Location: LUE Ortho Device/Splint Interventions: Ordered, Application   Post Interventions Patient Tolerated: Well  Gary Mitchell Cos 03/02/2024, 5:23 PM

## 2024-03-02 NOTE — Progress Notes (Signed)
 Patient ID: Gary Mitchell, male   DOB: 1971/04/07, 53 y.o.   MRN: [id number]    Progress Note   Subjective   Day # 3 CC; anemia, iron  deficiency in setting of EtOH induced cirrhosis, decompensated.  Admitted with altered mental status.  Recent right lower extremity injury wound VAC in place  EGD yesterday-1 cm hiatal hernia, grade 1 esophageal varices, patchy gastritis, 1 superficial gastric ulcer gastric antrum small amount of oozing treated with hemostatic clip, area of nodularity in the duodenum question possible adenoma -biopsy done  Labs WBC 4.8/hemoglobin 7.8/hematocrit 24.4/MCV 74/platelets 134 Potassium 4.3/BUN 5/creatinine 0.51 T. bili 2.3/alk phos 237/AST 47/ALT 20  Patient up in lounge chair, talking on the phone, mentating well, no GI complaints tolerating solid diet.  He says he has not noted any melena or hematochezia and did not note any prior to admission.  Complaints of abdominal pain.  Worried about his foot   Objective   Vital signs in last 24 hours: Temp:  [98 F (36.7 C)-98.9 F (37.2 C)] 98.9 F (37.2 C) (08/26 0749) Pulse Rate:  [90-99] 93 (08/26 0749) Resp:  [11-20] 20 (08/25 2318) BP: (122-139)/(78-91) 131/85 (08/26 0749) SpO2:  [97 %-100 %] 99 % (08/26 0749) Last BM Date : 02/29/24 General:    Older African-American male in NAD Heart:  Regular rate and rhythm; no murmurs Lungs: Respirations even and unlabored, lungs CTA bilaterally Abdomen:  Soft, nontender and nondistended. Normal bowel sounds. Extremities:  Without edema. Neurologic:  Alert and oriented,  grossly normal neurologically. Psych:  Cooperative. Normal mood and affect.  Intake/Output from previous day: 08/25 0701 - 08/26 0700 In: 1604.5 [P.O.:720; IV Piggyback:884.5] Out: 4090 [Urine:4090] Intake/Output this shift: Total I/O In: -  Out: 200 [Urine:200]  Lab Results: Recent Labs    02/29/24 1119 03/01/24 0740 03/02/24 0454  WBC 5.8 4.4 4.8  HGB 7.5* 8.2* 7.8*  HCT 23.6* 24.9*  24.4*  PLT 117* 115* 134*   BMET Recent Labs    02/29/24 1119 03/01/24 0740 03/02/24 0454  NA 138 132* 130*  K 3.5 3.0* 4.3  CL 105 100 100  CO2 25 24 24   GLUCOSE 240* 199* 274*  BUN <5* <5* <5*  CREATININE 0.50* 0.50* 0.51*  CALCIUM  8.4* 7.9* 8.3*   LFT Recent Labs    03/02/24 0454  PROT 7.4  ALBUMIN 2.1*  AST 47*  ALT 20  ALKPHOS 237*  BILITOT 2.3*   PT/INR Recent Labs    02/28/24 1557  LABPROT 17.5*  INR 1.4*    Studies/Results: DG Forearm Left Result Date: 03/01/2024 CLINICAL DATA:  Swelling, recent injury. EXAM: LEFT FOREARM - 2 VIEW COMPARISON:  None Available. FINDINGS: Oblique mildly displaced distal ulnar metaphyseal fracture. No definite intra-articular involvement. Distal radioulnar assessment is limited on provided views. Proximal forearm is intact. Elbow alignment is maintained. Soft tissue edema distally. IMPRESSION: Oblique mildly displaced distal ulnar metaphyseal fracture. Electronically Signed   By: Andrea Gasman M.D.   On: 03/01/2024 14:18   EEG adult Result Date: 02/29/2024 Shelton Arlin KIDD, MD     02/29/2024  5:21 PM Patient Name: Gary Mitchell MRN: [id number] Epilepsy Attending: Arlin KIDD Shelton Referring Physician/Provider: Perri DELENA Meliton Mickey., MD Date: 02/29/2024 Duration: 24.44 mins Patient history: 53yo M with ams. EEG to evaluate for seizure Level of alertness: Awake AEDs during EEG study: LEV, versed  Technical aspects: This EEG study was done with scalp electrodes positioned according to the 10-20 International system of electrode placement. Electrical activity was  reviewed with band pass filter of 1-70Hz , sensitivity of 7 uV/mm, display speed of 45mm/sec with a 60Hz  notched filter applied as appropriate. EEG data were recorded continuously and digitally stored.  Video monitoring was available and reviewed as appropriate. Description: The posterior dominant rhythm consists of 8 Hz activity of moderate voltage (25-35 uV) seen predominantly in  posterior head regions, symmetric and reactive to eye opening and eye closing. Hyperventilation and photic stimulation were not performed.   IMPRESSION: This study is within normal limits. No seizures or epileptiform discharges were seen throughout the recording. Priyanka MALVA Krebs       Assessment / Plan:    #54 53 year old male admitted with altered mental status/likely related to seizure disorder Mentating well currently  #2 acute on chronic anemia no evidence for overt GI blood loss, and actually heme-negative on admission. Suspect this is multifactorial as patient has had orthopedic surgery as of 02/04/2024 and had a wound VAC in place.   EGD yesterday with finding of grade 1 varices, patchy gastritis and 1 superficial gastric ulcer which was oozing and treated with Hemoclip for hemostasis. 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 Wilhelmenia had been scheduled for recent EUS but missed appointment due to being in the hospital this will need to be rescheduled  #5 history of atrial fibrillation-usually on Eliquis  currently on hold  Plan; Continue twice daily PPI x 8 weeks then once daily thereafter We will arrange for patient to be rescheduled for EUS for further evaluation of the pancreatic cyst.  Follow-up duodenal biopsies  GI will sign off, available if needed  Principal Problem:   AMS (altered mental status) Active Problems:   Seizure (HCC)   Subacute osteomyelitis of right ankle (HCC)   Acute gastric ulcer with hemorrhage   Hyperglycemia     LOS: 3 days   Fraser Busche PA-C 03/02/2024, 10:05 AM

## 2024-03-03 DIAGNOSIS — R400 Somnolence: Secondary | ICD-10-CM

## 2024-03-03 LAB — CBC WITH DIFFERENTIAL/PLATELET
Basophils Absolute: 0 K/uL (ref 0.0–0.1)
Basophils Relative: 0 %
Eosinophils Absolute: 0.3 K/uL (ref 0.0–0.5)
Eosinophils Relative: 6 %
HCT: 24.3 % — ABNORMAL LOW (ref 39.0–52.0)
Hemoglobin: 7.7 g/dL — ABNORMAL LOW (ref 13.0–17.0)
Lymphocytes Relative: 20 %
Lymphs Abs: 1.1 K/uL (ref 0.7–4.0)
MCH: 23.9 pg — ABNORMAL LOW (ref 26.0–34.0)
MCHC: 31.7 g/dL (ref 30.0–36.0)
MCV: 75.5 fL — ABNORMAL LOW (ref 80.0–100.0)
Monocytes Absolute: 0.3 K/uL (ref 0.1–1.0)
Monocytes Relative: 6 %
Neutro Abs: 3.8 K/uL (ref 1.7–7.7)
Neutrophils Relative %: 68 %
Platelets: 128 K/uL — ABNORMAL LOW (ref 150–400)
RBC: 3.22 MIL/uL — ABNORMAL LOW (ref 4.22–5.81)
RDW: 25.1 % — ABNORMAL HIGH (ref 11.5–15.5)
WBC: 5.6 K/uL (ref 4.0–10.5)
nRBC: 0 % (ref 0.0–0.2)

## 2024-03-03 LAB — GLUCOSE, CAPILLARY
Glucose-Capillary: 243 mg/dL — ABNORMAL HIGH (ref 70–99)
Glucose-Capillary: 402 mg/dL — ABNORMAL HIGH (ref 70–99)
Glucose-Capillary: 220 mg/dL — ABNORMAL HIGH (ref 70–99)
Glucose-Capillary: 221 mg/dL — ABNORMAL HIGH (ref 70–99)

## 2024-03-03 LAB — COMPREHENSIVE METABOLIC PANEL WITH GFR
ALT: 18 U/L (ref 0–44)
AST: 36 U/L (ref 15–41)
Albumin: 2.2 g/dL — ABNORMAL LOW (ref 3.5–5.0)
Alkaline Phosphatase: 228 U/L — ABNORMAL HIGH (ref 38–126)
Anion gap: 10 (ref 5–15)
BUN: 6 mg/dL (ref 6–20)
CO2: 22 mmol/L (ref 22–32)
Calcium: 8.3 mg/dL — ABNORMAL LOW (ref 8.9–10.3)
Chloride: 99 mmol/L (ref 98–111)
Creatinine, Ser: 0.52 mg/dL — ABNORMAL LOW (ref 0.61–1.24)
GFR, Estimated: >60 mL/min (ref >60)
Glucose, Bld: 234 mg/dL — ABNORMAL HIGH (ref 70–99)
Potassium: 3.8 mmol/L (ref 3.5–5.1)
Sodium: 131 mmol/L — ABNORMAL LOW (ref 135–145)
Total Bilirubin: 2 mg/dL — ABNORMAL HIGH (ref 0.0–1.2)
Total Protein: 7.3 g/dL (ref 6.5–8.1)

## 2024-03-03 LAB — CBC
HCT: 28.5 % — ABNORMAL LOW (ref 39.0–52.0)
Hemoglobin: 9.1 g/dL — ABNORMAL LOW (ref 13.0–17.0)
MCH: 24.1 pg — ABNORMAL LOW (ref 26.0–34.0)
MCHC: 31.9 g/dL (ref 30.0–36.0)
MCV: 75.4 fL — ABNORMAL LOW (ref 80.0–100.0)
Platelets: 193 K/uL (ref 150–400)
RBC: 3.78 MIL/uL — ABNORMAL LOW (ref 4.22–5.81)
RDW: 25.6 % — ABNORMAL HIGH (ref 11.5–15.5)
WBC: 7.6 K/uL (ref 4.0–10.5)
nRBC: 0 % (ref 0.0–0.2)

## 2024-03-03 LAB — MAGNESIUM
Magnesium: 1.4 mg/dL — ABNORMAL LOW (ref 1.7–2.4)
Magnesium: 1.4 mg/dL — ABNORMAL LOW (ref 1.7–2.4)

## 2024-03-03 LAB — PHOSPHORUS
Phosphorus: 3 mg/dL (ref 2.5–4.6)
Phosphorus: 3.2 mg/dL (ref 2.5–4.6)

## 2024-03-03 MED ORDER — IRON SUCROSE 400 MG IVPB - SIMPLE MED
400.0000 mg | Freq: Once | Status: AC
  Administered 2024-03-03: 400 mg via INTRAVENOUS
  Filled 2024-03-03: qty 400

## 2024-03-03 MED ORDER — HYDRALAZINE HCL 20 MG/ML IJ SOLN: 10.0000 mg | INTRAMUSCULAR | Status: DC | PRN

## 2024-03-03 MED ORDER — DOXYCYCLINE HYCLATE 100 MG PO TABS
100.0000 mg | ORAL_TABLET | Freq: Two times a day (BID) | ORAL | Status: DC
  Administered 2024-03-03 – 2024-03-04 (×2): 100 mg via ORAL
  Filled 2024-03-03 (×3): qty 1

## 2024-03-03 MED ORDER — MAGNESIUM SULFATE 4 GM/100ML IV SOLN
4.0000 g | Freq: Once | INTRAVENOUS | Status: DC
  Filled 2024-03-03: qty 100

## 2024-03-03 MED ORDER — AMOXICILLIN-POT CLAVULANATE 875-125 MG PO TABS
1.0000 | ORAL_TABLET | Freq: Two times a day (BID) | ORAL | Status: DC
  Administered 2024-03-03 – 2024-03-04 (×2): 1 via ORAL
  Filled 2024-03-03 (×3): qty 1

## 2024-03-03 MED ORDER — ADULT MULTIVITAMIN W/MINERALS CH: 1.0000 | ORAL_TABLET | ORAL | Status: DC

## 2024-03-03 MED ORDER — INSULIN ASPART 100 UNIT/ML IJ SOLN
5.0000 [IU] | Freq: Three times a day (TID) | INTRAMUSCULAR | Status: DC
  Administered 2024-03-03 – 2024-03-04 (×4): 5 [IU] via SUBCUTANEOUS

## 2024-03-03 MED ORDER — GUAIFENESIN 100 MG/5ML PO LIQD: 5.0000 mL | ORAL | Status: DC | PRN

## 2024-03-03 MED ORDER — IPRATROPIUM-ALBUTEROL 0.5-2.5 (3) MG/3ML IN SOLN: 3.0000 mL | RESPIRATORY_TRACT | Status: DC | PRN

## 2024-03-03 MED ORDER — GLUCAGON HCL RDNA (DIAGNOSTIC) 1 MG IJ SOLR: 1.0000 mg | INTRAMUSCULAR | Status: DC | PRN

## 2024-03-03 MED ORDER — MAGNESIUM SULFATE 4 GM/100ML IV SOLN
4.0000 g | Freq: Once | INTRAVENOUS | Status: AC
  Administered 2024-03-03: 4 g via INTRAVENOUS
  Filled 2024-03-03: qty 100

## 2024-03-03 NOTE — Progress Notes (Signed)
   03/03/24 1535  TOC Brief Assessment  Insurance and Status Reviewed  Patient has primary care physician Yes  Home environment has been reviewed homeless- reports that his sister has said he can not return to her home  Prior level of function: self  Prior/Current Home Services No current home services  Social Drivers of Health Review SDOH reviewed needs interventions  Readmission risk has been reviewed Yes  Transition of care needs transition of care needs identified, TOC will continue to follow    Attempted to see pt at bedside, pt taking a nap and was not arousable to voice. CM will return at a later time. Per chart review pt has declined HH/outpt follow up in the past. Has received DME- RW on 02/06/24 Tad), Shower Chair on 01/07/24 ([employer]) and wheelchair on 12/19/23 Daron). Insurance would not cover another RW or Wheelchair at this time (once every 22yrs). Will follow up with pt regarding what's wrong with w/c and where it is at presently. Note plan for oral abx on discharge.  IP CM to follow up on transition plan/needs

## 2024-03-03 NOTE — Progress Notes (Signed)
 PROGRESS NOTE    Gary Mitchell  FMW:[id number] DOB: 04/15/1971 DOA: 02/28/2024 PCP: Delbert Clam, MD    Brief Narrative:    53 yo with hx etoh abuse, cirrhosis, atrial fib on eliquis , T2DM and multiple other medical issues with recent closed R ankle fracture s/p ORIF 01/06/2024.  He was admitted by orthopedics with infected hardware 7/30 and had surgery per ortho with removal of hardware.  He was discharged on 8/1.  He's returned in the setting of being found down after a suspected seizure and right lower extremity swelling/infection.     Dr. Harden recommended transtibial amputation, he's currently refusing.  ID has been consulted for abx recs.  GI was consulted for his anemia and he's s/p EGD with an oozing gastric ulcer (clipped) and grade 1 varices (no stigmata bleeding).     He's currently homeless.  Therapy evals are pending.  Discharge is pending safe discharge plan, ID eval.      Assessment & Plan:  Principal Problem:   AMS (altered mental status) Active Problems:   Seizure (HCC)   Subacute osteomyelitis of right ankle (HCC)   Acute gastric ulcer with hemorrhage   Hyperglycemia     Right Lower Extremity Infection  Infected Hardware  Septic Arthritis  Osteomyelitis  Myositis  CT scan is showing concerns for right ankle hardware complicated by osteomyelitis/infection.  Orthopedic is recommending amputation but patient currently refusing.  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  Seen by infectious disease now recommending 6 weeks of p.o. doxycycline  and Augmentin .  Seizures Acute Metabolic Encephalopathy Question of possible seizure.  MRI brain showing T1 hyperintensity in the bilateral globus pallidus likely from hepatic insufficiency/cirrhosis.  EEG is negative for seizure-like activity.  Neurology recommending Keppra  1000 mg twice daily.   Per [place]  DMV statutes, patients with seizures are not allowed to drive until  they have been seizure-free  for six months. Use caution when using heavy equipment or power tools. Avoid working on ladders or at heights. Take showers instead of baths. Ensure the water  temperature is not too high on the home water  heater. Do not go swimming alone. When caring for infants or small children, sit down when holding, feeding, or changing them to minimize risk of injury to the child in the event you have a seizure. Also, Maintain good sleep hygiene. Avoid alcohol.   Anemia Hb 6.4 at presentation (was 9.9 at the end of July).  Seen by [facility].  Overall multifactorial in nature.  EGD on 8/25 showing grade 1 esophageal varices with slow oozing small ulcer.  Biopsies of these were taken.  For now recommending PPI twice daily for 8 weeks followed by daily.  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  Okay to resume Eliquis .  Iron  deficiency - Will give IV iron    Atrial Fibrillation Continue Eliquis    Etoh Abuse [place]    Decompensated cirrhosis due to esophageal varices Abortive care   T2DM Continue long-acting insulin .  Sliding scale and Accu-Cheks   Anion Gap Metabolic Acidosis resolved   Hypomagnesemia Replace and follow     Left Arm Swelling Distal Ulnar Metaphyseal Fracture Discussed with orthopedics, plan for sugar tong splint, NWB, and follow with Dr. Colman in 1-2 weeks   Homelessness TOC       DVT prophylaxis: Eliquis  Code Status: full Family Communication: none Disposition:    Status is: Inpatient Remains inpatient appropriate because: need for continued inpatient care     PT Follow up Recs: Outpatient  Pt8/26/2025 1115  Subjective: Resting comfortably no complaints.   Examination:  General exam: Appears calm and comfortable  Respiratory system: Clear to auscultation. Respiratory effort normal. Cardiovascular system: S1 & S2 heard, RRR. No JVD, murmurs, rubs, gallops or clicks. No pedal edema. Gastrointestinal system: Abdomen is nondistended, soft and nontender. No  organomegaly or masses felt. Normal bowel sounds heard. Central nervous system: Alert and oriented. No focal neurological deficits. Extremities: Symmetric 5 x 5 power. Skin: No rashes, lesions or ulcers Psychiatry: Judgement and insight appear normal. Mood & affect appropriate.                Diet Orders (From admission, onward)     Start     Ordered   03/01/24 1224  Diet Carb Modified Fluid consistency: Thin; Room service appropriate? Yes  Diet effective now       Question Answer Comment  Diet-HS Snack? Nothing   Calorie Level Medium 1600-2000   Fluid consistency: Thin   Room service appropriate? Yes      03/01/24 1224            Objective: Vitals:   03/02/24 1930 03/02/24 2247 03/03/24 0311 03/03/24 0800  BP: 132/80 138/89 (!) 145/82 131/87  Pulse:      Resp: 17 15 18 15   Temp: 98.6 F (37 C) 98.3 F (36.8 C) 97.8 F (36.6 C)   TempSrc: Oral Oral Oral   SpO2: 98% 97% 98%   Weight:      Height:        Intake/Output Summary (Last 24 hours) at 03/03/2024 1157 Last data filed at 03/03/2024 0513 Gross per 24 hour  Intake 600 ml  Output 3550 ml  Net -2950 ml   Filed Weights   02/28/24 2200 02/29/24 0105 03/01/24 0821  Weight: 64 kg 65 kg 65 kg    Scheduled Meds:  apixaban   2.5 mg Oral BID   folic acid   1 mg Oral Daily   insulin  aspart  0-15 Units Subcutaneous TID WC   insulin  aspart  5 Units Subcutaneous TID WC   insulin  glargine  25 Units Subcutaneous Daily   lactulose   20 g Oral Daily   levETIRAcetam   1,000 mg Oral BID   LORazepam   0-4 mg Oral Q12H   multivitamin with minerals  1 tablet Oral Daily   pantoprazole  (PROTONIX ) IV  40 mg Intravenous Q12H   thiamine   100 mg Oral Daily   Or   thiamine   100 mg Intravenous Daily   Continuous Infusions:  ampicillin -sulbactam (UNASYN ) IV 3 g (03/03/24 0959)   iron  sucrose 400 mg (03/03/24 1030)   magnesium  sulfate bolus IVPB     vancomycin  1,000 mg (03/03/24 1033)    Nutritional status      Body mass index is 23.13 kg/m.  Data Reviewed:   CBC: Recent Labs  Lab 02/28/24 1557 02/28/24 1627 02/29/24 1119 03/01/24 0740 03/02/24 0454 03/03/24 0420 03/03/24 0901  WBC 6.3  --  5.8 4.4 4.8 5.6 7.6  NEUTROABS 4.0  --  4.4 2.4 2.5 3.8  --   HGB 6.4*   < > 7.5* 8.2* 7.8* 7.7* 9.1*  HCT 20.8*   < > 23.6* 24.9* 24.4* 24.3* 28.5*  MCV 76.2*  --  75.4* 73.7* 74.4* 75.5* 75.4*  PLT 156  --  117* 115* 134* 128* 193   < > = values in this interval not displayed.   Basic Metabolic Panel: Recent Labs  Lab 02/28/24 1759 02/29/24 0644 02/29/24 1119 03/01/24 0740  03/02/24 0454 03/03/24 0420 03/03/24 0901  NA 137  --  138 132* 130* 131*  --   K 4.1  --  3.5 3.0* 4.3 3.8  --   CL 102  --  105 100 100 99  --   CO2 19*  --  25 24 24 22   --   GLUCOSE 557*  --  240* 199* 274* 234*  --   BUN 7  --  <5* <5* <5* 6  --   CREATININE 0.78  --  0.50* 0.50* 0.51* 0.52*  --   CALCIUM  9.3  --  8.4* 7.9* 8.3* 8.3*  --   MG  --  1.1*  --  1.2* 1.5* 1.4* 1.4*  PHOS  --   --   --  2.9 2.6 3.0 3.2   GFR: Estimated Creatinine Clearance: 98.6 mL/min (A) (by C-G formula based on SCr of 0.52 mg/dL (L)). Liver Function Tests: Recent Labs  Lab 02/28/24 1759 02/29/24 1119 03/01/24 0740 03/02/24 0454 03/03/24 0420  AST 36 36 42* 47* 36  ALT 20 18 18 20 18   ALKPHOS 277* 225* 235* 237* 228*  BILITOT 2.2* 2.6* 3.6* 2.3* 2.0*  PROT 9.1* 7.8 7.5 7.4 7.3  ALBUMIN 2.8* 2.2* 2.0* 2.1* 2.2*   No results for input(s): LIPASE, AMYLASE in the last 168 hours. Recent Labs  Lab 02/29/24 0019  AMMONIA 90*   Coagulation Profile: Recent Labs  Lab 02/28/24 1557  INR 1.4*   Cardiac Enzymes: No results for input(s): CKTOTAL, CKMB, CKMBINDEX, TROPONINI in the last 168 hours. BNP (last 3 results) No results for input(s): PROBNP in the last 8760 hours. HbA1C: No results for input(s): HGBA1C in the last 72 hours. CBG: Recent Labs  Lab 03/02/24 1107 03/02/24 1700 03/02/24 2123  03/03/24 0801 03/03/24 1125  GLUCAP 302* 296* 300* 243* 402*   Lipid Profile: No results for input(s): CHOL, HDL, LDLCALC, TRIG, CHOLHDL, LDLDIRECT in the last 72 hours. Thyroid  Function Tests: No results for input(s): TSH, T4TOTAL, FREET4, T3FREE, THYROIDAB in the last 72 hours. Anemia Panel: Recent Labs    02/29/24 1351 03/01/24 0740  VITAMINB12  --  392  FOLATE  --  13.5  FERRITIN 15* 20*  TIBC 372 350  IRON  135 151   Sepsis Labs: Recent Labs  Lab 02/28/24 1627 02/28/24 2035 02/29/24 0020  LATICACIDVEN 7.3* 4.6* 3.0*    Recent Results (from the past 240 hours)  Culture, blood (Routine X 2) w Reflex to ID Panel     Status: None (Preliminary result)   Collection Time: 02/29/24 12:19 AM   Specimen: BLOOD RIGHT HAND  Result Value Ref Range Status   Specimen Description BLOOD RIGHT HAND  Final   Special Requests   Final    BOTTLES DRAWN AEROBIC AND ANAEROBIC Blood Culture adequate volume   Culture   Final    NO GROWTH 3 DAYS Performed at [facility], [address], [place], [place] [address]    Report Status PENDING  Incomplete  Culture, blood (Routine X 2) w Reflex to ID Panel     Status: None (Preliminary result)   Collection Time: 02/29/24  6:44 AM   Specimen: BLOOD RIGHT HAND  Result Value Ref Range Status   Specimen Description BLOOD RIGHT HAND  Final   Special Requests   Final    BOTTLES DRAWN AEROBIC AND ANAEROBIC Blood Culture adequate volume   Culture   Final    NO GROWTH 3 DAYS Performed at [facility], [address] [place], [place] [address]    Report Status PENDING  Incomplete         Radiology Studies: VAS US  UPPER EXTREMITY VENOUS DUPLEX Result Date: 03/02/2024 UPPER VENOUS STUDY  Patient Name:  Gary Mitchell  Date of Exam:   03/02/2024 Medical Rec #: [id number]        Accession #:    [id number] Date of Birth: 09/07/1970        Patient Gender: M Patient Age:   40 years Exam Location:  [facility]  Procedure:      VAS US  UPPER EXTREMITY VENOUS DUPLEX Referring Phys: A POWELL JR --------------------------------------------------------------------------------  Indications: Swelling Performing Technologist: Elmarie Lindau, RVT  Examination Guidelines: A complete evaluation includes B-mode imaging, spectral Doppler, color Doppler, and power Doppler as needed of all accessible portions of each vessel. Bilateral testing is considered an integral part of a complete examination. Limited examinations for reoccurring indications may be performed as noted.  Right Findings: +----------+------------+---------+-----------+----------+-------+ RIGHT     CompressiblePhasicitySpontaneousPropertiesSummary +----------+------------+---------+-----------+----------+-------+ Subclavian               Yes                                +----------+------------+---------+-----------+----------+-------+  Left Findings: +----------+------------+---------+-----------+----------+-------+ LEFT      CompressiblePhasicitySpontaneousPropertiesSummary +----------+------------+---------+-----------+----------+-------+ IJV           Full       Yes                                +----------+------------+---------+-----------+----------+-------+ Subclavian               Yes                                +----------+------------+---------+-----------+----------+-------+ Axillary      Full                                          +----------+------------+---------+-----------+----------+-------+ Brachial      Full                                          +----------+------------+---------+-----------+----------+-------+ Radial        Full                                          +----------+------------+---------+-----------+----------+-------+ Ulnar         Full                                          +----------+------------+---------+-----------+----------+-------+ Cephalic       Full                                          +----------+------------+---------+-----------+----------+-------+ Basilic       Full                                          +----------+------------+---------+-----------+----------+-------+  Summary:  Right: No evidence of thrombosis in the subclavian.  Left: No evidence of deep vein thrombosis in the upper extremity. No evidence of superficial vein thrombosis in the upper extremity.  *See table(s) above for measurements and observations.  Diagnosing physician: Gaile New MD Electronically signed by Gaile New MD on 03/02/2024 at 7:17:44 PM.    Final            LOS: 4 days   Time spent= 35 mins    Burgess JAYSON Dare, MD [facility]  If 7PM-7AM, please contact night-coverage  03/03/2024, 11:57 AM

## 2024-03-04 ENCOUNTER — Other Ambulatory Visit (HOSPITAL_COMMUNITY): Payer: Self-pay

## 2024-03-04 ENCOUNTER — Other Ambulatory Visit: Payer: Self-pay

## 2024-03-04 DIAGNOSIS — R400 Somnolence: Secondary | ICD-10-CM | POA: Diagnosis not present

## 2024-03-04 LAB — CBC
HCT: 24.8 % — ABNORMAL LOW (ref 39.0–52.0)
Hemoglobin: 7.8 g/dL — ABNORMAL LOW (ref 13.0–17.0)
MCH: 24 pg — ABNORMAL LOW (ref 26.0–34.0)
MCHC: 31.5 g/dL (ref 30.0–36.0)
MCV: 76.3 fL — ABNORMAL LOW (ref 80.0–100.0)
Platelets: 123 K/uL — ABNORMAL LOW (ref 150–400)
RBC: 3.25 MIL/uL — ABNORMAL LOW (ref 4.22–5.81)
RDW: 25.8 % — ABNORMAL HIGH (ref 11.5–15.5)
WBC: 6.2 K/uL (ref 4.0–10.5)
nRBC: 0 % (ref 0.0–0.2)

## 2024-03-04 LAB — BASIC METABOLIC PANEL WITH GFR
Anion gap: 9 (ref 5–15)
BUN: 7 mg/dL (ref 6–20)
CO2: 20 mmol/L — ABNORMAL LOW (ref 22–32)
Calcium: 8.2 mg/dL — ABNORMAL LOW (ref 8.9–10.3)
Chloride: 102 mmol/L (ref 98–111)
Creatinine, Ser: 0.57 mg/dL — ABNORMAL LOW (ref 0.61–1.24)
GFR, Estimated: >60 mL/min (ref >60)
Glucose, Bld: 290 mg/dL — ABNORMAL HIGH (ref 70–99)
Potassium: 4 mmol/L (ref 3.5–5.1)
Sodium: 131 mmol/L — ABNORMAL LOW (ref 135–145)

## 2024-03-04 LAB — PHOSPHORUS: Phosphorus: 2.8 mg/dL (ref 2.5–4.6)

## 2024-03-04 LAB — MAGNESIUM: Magnesium: 1.5 mg/dL — ABNORMAL LOW (ref 1.7–2.4)

## 2024-03-04 LAB — GLUCOSE, CAPILLARY: Glucose-Capillary: 289 mg/dL — ABNORMAL HIGH (ref 70–99)

## 2024-03-04 MED ORDER — LEVETIRACETAM 1000 MG PO TABS
1000.0000 mg | ORAL_TABLET | Freq: Two times a day (BID) | ORAL | 0 refills | Status: DC
  Filled 2024-03-04: qty 60, 30d supply, fill #0

## 2024-03-04 MED ORDER — ADULT MULTIVITAMIN W/MINERALS CH
1.0000 | ORAL_TABLET | ORAL | 0 refills | Status: DC
  Filled 2024-03-04: qty 30, 30d supply, fill #0

## 2024-03-04 MED ORDER — PANTOPRAZOLE SODIUM 40 MG PO TBEC
DELAYED_RELEASE_TABLET | ORAL | 0 refills | Status: DC
  Filled 2024-03-04: qty 150, 90d supply, fill #0

## 2024-03-04 MED ORDER — MAGNESIUM OXIDE -MG SUPPLEMENT 400 (240 MG) MG PO TABS
800.0000 mg | ORAL_TABLET | Freq: Once | ORAL | Status: AC
  Administered 2024-03-04: 800 mg via ORAL
  Filled 2024-03-04: qty 2

## 2024-03-04 MED ORDER — MAGNESIUM SULFATE 4 GM/100ML IV SOLN
4.0000 g | Freq: Once | INTRAVENOUS | Status: AC
  Administered 2024-03-04: 4 g via INTRAVENOUS
  Filled 2024-03-04: qty 100

## 2024-03-04 MED ORDER — THIAMINE HCL 100 MG PO TABS
100.0000 mg | ORAL_TABLET | Freq: Every day | ORAL | 0 refills | Status: DC
  Filled 2024-03-04: qty 90, 90d supply, fill #0

## 2024-03-04 MED ORDER — FOLIC ACID 1 MG PO TABS
1.0000 mg | ORAL_TABLET | Freq: Every day | ORAL | 0 refills | Status: DC
  Filled 2024-03-04: qty 90, 90d supply, fill #0

## 2024-03-04 MED ORDER — AMOXICILLIN-POT CLAVULANATE 875-125 MG PO TABS
1.0000 | ORAL_TABLET | Freq: Two times a day (BID) | ORAL | 0 refills | Status: AC
  Filled 2024-03-04: qty 60, 30d supply, fill #0

## 2024-03-04 MED ORDER — DOXYCYCLINE HYCLATE 100 MG PO TABS
100.0000 mg | ORAL_TABLET | Freq: Two times a day (BID) | ORAL | 0 refills | Status: AC
  Filled 2024-03-04: qty 60, 30d supply, fill #0

## 2024-03-04 NOTE — Progress Notes (Signed)
 Physical Therapy Treatment Patient Details Name: Gary Mitchell MRN: [id number] DOB: [birth year] Today's Date: 03/04/2024   History of Present Illness Pt is 53 yo male presents to [facility] ED on 02/28/24 with c/o seizure, AMS, and headache. S/p EGD 8/25. L distal ulna fx. PMH: Infected hardware removal 7/30, R ankle fx ORIF 01/06/24, EtOH abuse, hepatitis, seizure disorder, DM2, neuropathy. Pancreatitis, cirrhosis, coagulopathy, depression    PT Comments  Patient demonstrated independence with bed mobility, transfers, and gait training without a device. Reports he has been walking to the bathroom on his own. Reports he now plans to stay with someone who has a ramp to enter home, although based on his ambulation feel he could manage several steps if needed. No further PT or DME needs identified. Plan is for pt to discharge today.     If plan is discharge home, recommend the following: Assist for transportation   Can travel by private vehicle        Equipment Recommendations  None recommended by PT    Recommendations for Other Services       Precautions / Restrictions Precautions Precautions: Fall Restrictions Weight Bearing Restrictions Per Provider Order: Yes LUE Weight Bearing Per Provider Order: Non weight bearing RLE Weight Bearing Per Provider Order: Weight bearing as tolerated     Mobility  Bed Mobility Overal bed mobility: Needs Assistance Bed Mobility: Supine to Sit     Supine to sit: Independent          Transfers Overall transfer level: Independent Equipment used: None Transfers: Sit to/from Stand Sit to Stand: Independent           General transfer comment: from EOB; no assist or imbalance    Ambulation/Gait Ambulation/Gait assistance: Independent Gait Distance (Feet): 25 Feet Assistive device: None Gait Pattern/deviations: Decreased stance time - right, Decreased step length - right       General Gait Details: mild pain in rt ankle without boot due to  pt reports it has rubbed his skin and opened wounds around ankle (bandaged and could not observe)   Stairs Stairs:  (states he will have a ramp where he is going to stay)           Wheelchair Mobility     Tilt Bed    Modified Rankin (Stroke Patients Only)       Balance Overall balance assessment: Needs assistance Sitting-balance support: Feet supported Sitting balance-Leahy Scale: Good     Standing balance support: No upper extremity supported, During functional activity Standing balance-Leahy Scale: Good Standing balance comment: dynamic standing witout difficulty                            Communication Communication Communication: No apparent difficulties  Cognition Arousal: Alert Behavior During Therapy: WFL for tasks assessed/performed   PT - Cognitive impairments: No apparent impairments                         Following commands: Intact      Cueing Cueing Techniques: Verbal cues, Gestural cues  Exercises      General Comments        Pertinent Vitals/Pain Pain Assessment Pain Assessment: Faces Faces Pain Scale: Hurts a little bit Pain Location: R ankle Pain Descriptors / Indicators: Aching Pain Intervention(s): Limited activity within patient's tolerance, Monitored during session    Home Living  Prior Function            PT Goals (current goals can now be found in the care plan section) Acute Rehab PT Goals Patient Stated Goal: To get better PT Goal Formulation: With patient Time For Goal Achievement: 03/16/24 Potential to Achieve Goals: Good    Frequency    Min 3X/week      PT Plan      Co-evaluation              AM-PAC PT 6 Clicks Mobility   Outcome Measure  Help needed turning from your back to your side while in a flat bed without using bedrails?: None Help needed moving from lying on your back to sitting on the side of a flat bed without using bedrails?:  None Help needed moving to and from a bed to a chair (including a wheelchair)?: None Help needed standing up from a chair using your arms (e.g., wheelchair or bedside chair)?: None Help needed to walk in hospital room?: None Help needed climbing 3-5 steps with a railing? : None 6 Click Score: 24    End of Session   Activity Tolerance: Patient tolerated treatment well Patient left: with call bell/phone within reach;in bed Nurse Communication: Mobility status;Other (comment) (ok for discharge) PT Visit Diagnosis: Other abnormalities of gait and mobility (R26.89);Muscle weakness (generalized) (M62.81)     Time: 8898-8888 PT Time Calculation (min) (ACUTE ONLY): 10 min  Charges:    $Gait Training: 8-22 mins PT General Charges $$ ACUTE PT VISIT: 1 Visit                      Macario RAMAN, PT Acute Rehabilitation Services  Office [phone]    Macario SHAUNNA Soja 03/04/2024, 11:17 AM

## 2024-03-04 NOTE — Discharge Summary (Signed)
 Physician Discharge Summary  Gary Mitchell FMW:[id number] DOB: [birth year] DOA: 02/28/2024  PCP: Delbert Clam, MD  Admit date: 02/28/2024 Discharge date: 03/04/2024  Admitted From: Home Disposition:  Home  Recommendations for Outpatient Follow-up:  Follow up with PCP in 1-2 weeks Please obtain BMP/CBC in one week your next doctors visit.  Declined Amputation for now, advised outpatient follow up with Dr Harden from Ortho 6 weeks of PO Abx, Doxy and Augmentin .  Has outpatient ID appt Keppra  changed to 1000mg  bid.  Outpatint GI Appt for EUS per [facility]   Discharge Condition: Stable CODE STATUS: Full Diet recommendation: Diabetic  Brief/Interim Summary: Brief Narrative:    53 yo with hx etoh abuse, cirrhosis, atrial fib on eliquis , T2DM and multiple other medical issues with recent closed R ankle fracture s/p ORIF 01/06/2024.  He was admitted by orthopedics with infected hardware 7/30 and had surgery per ortho with removal of hardware.  He was discharged on 8/1.  He's returned in the setting of being found down after a suspected seizure and right lower extremity swelling/infection.     Dr. Harden recommended transtibial amputation, he's currently refusing.  ID has been consulted for abx recs.  GI was consulted for his anemia and he's s/p EGD with an oozing gastric ulcer (clipped) and grade 1 varices (no stigmata bleeding).     Patient was placed on 8 weeks of PPI twice daily followed by daily.  Plans for outpatient EUS. Patient declining amputation, seen by ID recommending 6 weeks of p.o. antibiotics doxycycline  and Augmentin .  Will discharge the patient.    Assessment & Plan:  Principal Problem:   AMS (altered mental status) Active Problems:   Seizure (HCC)   Subacute osteomyelitis of right ankle (HCC)   Acute gastric ulcer with hemorrhage   Hyperglycemia     Right Lower Extremity Infection  Infected Hardware  Septic Arthritis  Osteomyelitis  Myositis  CT scan is showing  concerns for right ankle hardware complicated by osteomyelitis/infection.  Orthopedic is recommending amputation but patient currently refusing.  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  Seen by infectious disease now recommending 6 weeks of p.o. doxycycline  and Augmentin .  Outptn ID appt 9/18 Can follow up with Outpatient with Dr Harden if he is interested.   Seizures Acute Metabolic Encephalopathy Question of possible seizure.  MRI brain showing T1 hyperintensity in the bilateral globus pallidus likely from hepatic insufficiency/cirrhosis.  EEG is negative for seizure-like activity.  Neurology recommending Keppra  1000 mg twice daily.   Per [place]  DMV statutes, patients with seizures are not allowed to drive until  they have been seizure-free for six months. Use caution when using heavy equipment or power tools. Avoid working on ladders or at heights. Take showers instead of baths. Ensure the water  temperature is not too high on the home water  heater. Do not go swimming alone. When caring for infants or small children, sit down when holding, feeding, or changing them to minimize risk of injury to the child in the event you have a seizure. Also, Maintain good sleep hygiene. Avoid alcohol.   Anemia Hb 6.4 at presentation (was 9.9 at the end of July).  Seen by [facility].  Overall multifactorial in nature.  EGD on 8/25 showing grade 1 esophageal varices with slow oozing small ulcer.  Biopsies of these were taken.  For now recommending PPI twice daily for 8 weeks followed by daily.  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  Okay to resume Eliquis .  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  Sliding scale and Accu-Cheks   Anion Gap Metabolic Acidosis resolved    Hypomagnesemia Replace and follow     Left Arm Swelling Distal Ulnar Metaphyseal Fracture Discussed with orthopedics, plan for sugar tong splint, NWB, and follow with Dr. Colman in 1-2 weeks   Homelessness TOC       DVT prophylaxis: Eliquis  Code Status: full Family Communication: none Disposition:    Status is: Inpatient Remains inpatient appropriate because: need for continued inpatient care     PT Follow up Recs: Outpatient Pt8/26/2025 1115  Subjective: Resting comfortably no complaints.   Examination:  General exam: Appears calm and comfortable  Respiratory system: Clear to auscultation. Respiratory effort normal. Cardiovascular system: S1 & S2 heard, RRR. No JVD, murmurs, rubs, gallops or clicks. No pedal edema. Gastrointestinal system: Abdomen is nondistended, soft and nontender. No organomegaly or masses felt. Normal bowel sounds heard. Central nervous system: Alert and oriented. No focal neurological deficits. Extremities: Symmetric 5 x 5 power. Skin: No rashes, lesions or ulcers Psychiatry: Judgement and insight appear normal. Mood & affect appropriate.    Discharge Diagnoses:  Principal Problem:   AMS (altered mental status) Active Problems:   Seizure (HCC)   Subacute osteomyelitis of right ankle (HCC)   Acute gastric ulcer with hemorrhage   Hyperglycemia      Discharge Exam: Vitals:   03/04/24 0744 03/04/24 0746  BP:  (!) 145/96  Pulse:    Resp:  20  Temp: 97.6 F (36.4 C) 97.6 F (36.4 C)  SpO2:     Vitals:   03/03/24 2306 03/04/24 0326 03/04/24 0744 03/04/24 0746  BP: (!) 139/94 134/84  (!) 145/96  Pulse: 96     Resp: 20 20  20   Temp: 98.7 F (37.1 C) 98.4 F (36.9 C) 97.6 F (36.4 C) 97.6 F (36.4 C)  TempSrc: Oral Oral  Oral  SpO2: 98% 100%    Weight:      Height:          Discharge Instructions   Allergies as of 03/04/2024       Reactions   Iohexol  Other (See Comments)   Unknown reaction at 41 days old Mom at  bedside reported patient was given injections of iohexol  in foot and resulted in hole in foot or foot got infected   Nsaids Other (See Comments)   Patient said he was told he could take Tylenol  ONLY.        Medication List     TAKE these medications    acetaminophen  500 MG tablet Commonly known as: TYLENOL  Take 1,500 mg by mouth daily as needed for moderate pain (pain score 4-6), fever or headache.   amoxicillin -clavulanate 875-125 MG tablet Commonly known as: AUGMENTIN  Take 1 tablet by mouth every 12 (twelve) hours.   atorvastatin  20 MG tablet Commonly known as: LIPITOR Take 1 tablet (20 mg total) by mouth daily.   CertaVite/Antioxidants Tabs Take 1 tablet by mouth daily.   doxycycline  100 MG tablet Commonly known as: VIBRA -TABS Take 1 tablet (100 mg total) by mouth every 12 (twelve) hours.   Eliquis  2.5 MG Tabs tablet Generic drug: apixaban  Take 1 tablet (2.5 mg total) by mouth 2 (two) times daily for 28 days.  folic acid  1 MG tablet Commonly known as: FOLVITE  Take 1 tablet (1 mg total) by mouth daily. Start taking on: March 05, 2024   FreeStyle Libre 3 Plus Sensor Misc Change sensor every 15 days. Use to check blood sugar continuously.   ibuprofen  200 MG tablet Commonly known as: ADVIL  Take 400 mg by mouth every 6 (six) hours as needed for mild pain (pain score 1-3) or moderate pain (pain score 4-6).   insulin  lispro 100 UNIT/ML KwikPen Commonly known as: HumaLOG  KwikPen Inject 10 Units into the skin 3 (three) times daily.   Jardiance  10 MG Tabs tablet Generic drug: empagliflozin  Take 1 tablet (10 mg total) by mouth daily before breakfast.   lactulose  10 GM/15ML solution Commonly known as: CHRONULAC  Take 45 mLs (30 g total) by mouth as needed for mild constipation.   Lantus  SoloStar 100 UNIT/ML Solostar Pen Generic drug: insulin  glargine Inject 30 Units into the skin daily.   levETIRAcetam  1000 MG tablet Commonly known as: Keppra  Take 1 tablet  (1,000 mg total) by mouth 2 (two) times daily. What changed:  medication strength how much to take   metFORMIN  500 MG tablet Commonly known as: GLUCOPHAGE  Take 2 tablets (1,000 mg total) by mouth 2 (two) times daily with a meal.   metoprolol  succinate 25 MG 24 hr tablet Commonly known as: TOPROL -XL Take 0.5 tablets (12.5 mg total) by mouth daily.   Muscle Rub 10-15 % Crea Apply 1 Application topically as needed for muscle pain (arm pain).   pantoprazole  40 MG tablet Commonly known as: PROTONIX  Take 1 tablet (40 mg total) by mouth 2 (two) times daily before a meal for 60 days, THEN 1 tablet (40 mg total) daily before breakfast. Start taking on: March 04, 2024 What changed: See the new instructions.   thiamine  100 MG tablet Commonly known as: VITAMIN B1 Take 1 tablet (100 mg total) by mouth daily. Start taking on: March 05, 2024        Follow-up Information     Delbert Clam, MD Follow up in 1 week(s).   Specialty: Family Medicine Contact information: [address] [place] [address] [phone]         [employer] Follow up.   Why: You may contact [employer] regarding your wheelchair issues- may need to take to the store location above for the to assess the chair and see if it can be repaired.  (if you need another wheelchair insurance would not cover and you would have to pay out of pocket for replacement- estimated cost $600 Contact information: [address] [place] [address] [phone]                Allergies  Allergen Reactions   Iohexol  Other (See Comments)    Unknown reaction at 1 days old Mom at bedside reported patient was given injections of iohexol  in foot and resulted in hole in foot or foot got infected   Nsaids Other (See Comments)    Patient said he was told he could take Tylenol  ONLY.    You were cared for by a hospitalist during your hospital stay. If you have any questions about your  discharge medications or the care you received while you were in the hospital after you are discharged, you can call the unit and asked to speak with the hospitalist on call if the hospitalist that took care of you is not available. Once you are discharged, your primary care physician will handle any  further medical issues. Please note that no refills for any discharge medications will be authorized once you are discharged, as it is imperative that you return to your primary care physician (or establish a relationship with a primary care physician if you do not have one) for your aftercare needs so that they can reassess your need for medications and monitor your lab values.  You were cared for by a hospitalist during your hospital stay. If you have any questions about your discharge medications or the care you received while you were in the hospital after you are discharged, you can call the unit and asked to speak with the hospitalist on call if the hospitalist that took care of you is not available. Once you are discharged, your primary care physician will handle any further medical issues. Please note that NO REFILLS for any discharge medications will be authorized once you are discharged, as it is imperative that you return to your primary care physician (or establish a relationship with a primary care physician if you do not have one) for your aftercare needs so that they can reassess your need for medications and monitor your lab values.  Please request your Prim.MD to go over all Hospital Tests and Procedure/Radiological results at the follow up, please get all Hospital records sent to your Prim MD by signing hospital release before you go home.  Get CBC, CMP, 2 view Chest X ray checked  by Primary MD during your next visit or SNF MD in 5-7 days ( we routinely change or add medications that can affect your baseline labs and fluid status, therefore we recommend that you get the mentioned basic workup  next visit with your PCP, your PCP may decide not to get them or add new tests based on their clinical decision)  On your next visit with your primary care physician please Get Medicines reviewed and adjusted.  If you experience worsening of your admission symptoms, develop shortness of breath, life threatening emergency, suicidal or homicidal thoughts you must seek medical attention immediately by calling 911 or calling your MD immediately  if symptoms less severe.  You Must read complete instructions/literature along with all the possible adverse reactions/side effects for all the Medicines you take and that have been prescribed to you. Take any new Medicines after you have completely understood and accpet all the possible adverse reactions/side effects.   Do not drive, operate heavy machinery, perform activities at heights, swimming or participation in water  activities or provide baby sitting services if your were admitted for syncope or siezures until you have seen by Primary MD or a Neurologist and advised to do so again.  Do not drive when taking Pain medications.   Procedures/Studies: VAS US  UPPER EXTREMITY VENOUS DUPLEX Result Date: 03/02/2024 UPPER VENOUS STUDY  Patient Name:  Gary Mitchell  Date of Exam:   03/02/2024 Medical Rec #: [id number]        Accession #:    [id number] Date of Birth: [birth year]        Patient Gender: M Patient Age:   16 years Exam Location:  [facility] Procedure:      VAS US  UPPER EXTREMITY VENOUS DUPLEX Referring Phys: A POWELL JR --------------------------------------------------------------------------------  Indications: Swelling Performing Technologist: Elmarie Lindau, RVT  Examination Guidelines: A complete evaluation includes B-mode imaging, spectral Doppler, color Doppler, and power Doppler as needed of all accessible portions of each vessel. Bilateral testing is considered an integral part of a complete examination. Limited examinations for  reoccurring  indications may be performed as noted.  Right Findings: +----------+------------+---------+-----------+----------+-------+ RIGHT     CompressiblePhasicitySpontaneousPropertiesSummary +----------+------------+---------+-----------+----------+-------+ Subclavian               Yes                                +----------+------------+---------+-----------+----------+-------+  Left Findings: +----------+------------+---------+-----------+----------+-------+ LEFT      CompressiblePhasicitySpontaneousPropertiesSummary +----------+------------+---------+-----------+----------+-------+ IJV           Full       Yes                                +----------+------------+---------+-----------+----------+-------+ Subclavian               Yes                                +----------+------------+---------+-----------+----------+-------+ Axillary      Full                                          +----------+------------+---------+-----------+----------+-------+ Brachial      Full                                          +----------+------------+---------+-----------+----------+-------+ Radial        Full                                          +----------+------------+---------+-----------+----------+-------+ Ulnar         Full                                          +----------+------------+---------+-----------+----------+-------+ Cephalic      Full                                          +----------+------------+---------+-----------+----------+-------+ Basilic       Full                                          +----------+------------+---------+-----------+----------+-------+  Summary:  Right: No evidence of thrombosis in the subclavian.  Left: No evidence of deep vein thrombosis in the upper extremity. No evidence of superficial vein thrombosis in the upper extremity.  *See table(s) above for measurements and observations.  Diagnosing  physician: Gaile New MD Electronically signed by Gaile New MD on 03/02/2024 at 7:17:44 PM.    Final    DG Forearm Left Result Date: 03/01/2024 CLINICAL DATA:  Swelling, recent injury. EXAM: LEFT FOREARM - 2 VIEW COMPARISON:  None Available. FINDINGS: Oblique mildly displaced distal ulnar metaphyseal fracture. No definite intra-articular involvement. Distal radioulnar assessment is limited on provided views. Proximal forearm is intact. Elbow alignment is maintained. Soft tissue edema distally. IMPRESSION: Oblique  mildly displaced distal ulnar metaphyseal fracture. Electronically Signed   By: Andrea Gasman M.D.   On: 03/01/2024 14:18   EEG adult Result Date: 02/29/2024 Shelton Arlin KIDD, MD     02/29/2024  5:21 PM Patient Name: KARMAN VENEY MRN: [id number] Epilepsy Attending: Arlin KIDD Shelton Referring Physician/Provider: Perri DELENA Meliton Mickey., MD Date: 02/29/2024 Duration: 24.44 mins Patient history: 53yo M with ams. EEG to evaluate for seizure Level of alertness: Awake AEDs during EEG study: LEV, versed  Technical aspects: This EEG study was done with scalp electrodes positioned according to the 10-20 International system of electrode placement. Electrical activity was reviewed with band pass filter of 1-70Hz , sensitivity of 7 uV/mm, display speed of 9mm/sec with a 60Hz  notched filter applied as appropriate. EEG data were recorded continuously and digitally stored.  Video monitoring was available and reviewed as appropriate. Description: The posterior dominant rhythm consists of 8 Hz activity of moderate voltage (25-35 uV) seen predominantly in posterior head regions, symmetric and reactive to eye opening and eye closing. Hyperventilation and photic stimulation were not performed.   IMPRESSION: This study is within normal limits. No seizures or epileptiform discharges were seen throughout the recording. Priyanka O Yadav   US  ASCITES (ABDOMEN LIMITED) Result Date: 02/29/2024 EXAM: LIMITED  ABDOMINAL ULTRASOUND FOR ASCITES EVALUATION TECHNIQUE: Limited real-time sonography of all 4 quadrants of the abdomen was performed for evaluation of ascites. COMPARISON: None. CLINICAL HISTORY: 07165 Cirrhosis (HCC) [id number]. Cirrhosis (HCC). FINDINGS: RIGHT UPPER QUADRANT: No ascites seen. LEFT UPPER QUADRANT: No ascites seen. RIGHT LOWER QUADRANT: No ascites seen. LEFT LOWER QUADRANT: No ascites seen. OTHER: Limited visualization of the rest of the abdomen demonstrates no acute abnormality. IMPRESSION: 1. No significant ascites. Electronically signed by: Katheleen Faes MD 02/29/2024 12:17 PM EDT RP Workstation: [id number]   MR BRAIN WO CONTRAST Result Date: 02/29/2024 CLINICAL DATA:  53 year old male status post seizure. EXAM: MRI HEAD WITHOUT CONTRAST TECHNIQUE: Multiplanar, multiecho pulse sequences of the brain and surrounding structures were obtained without intravenous contrast. COMPARISON:  Head CT yesterday.  Brain MRI 09/18/2020. FINDINGS: Brain: Stable cerebral volume since 2022. Intermittent mild motion artifact. No restricted diffusion to suggest acute infarction. No midline shift, mass effect, evidence of mass lesion, ventriculomegaly, extra-axial collection or acute intracranial hemorrhage. Cervicomedullary junction and pituitary are within normal limits. Chronic patchy T2 and FLAIR hyperintensity in the periventricular white matter is stable and nonspecific. No cortical encephalomalacia or chronic cerebral blood products identified. Intrinsic increased T1 signal in the bilateral globus pallidus (series 15, image 29). Deep gray nuclei, brainstem and cerebellum otherwise negative. Vascular: Major intracranial vascular flow voids are stable since 2022, preserved. Skull and upper cervical spine: Conspicuous DWI signal in the calvarium (series 5, image 79) but normal T1 marrow signal, and stable findings compared to 2022. Negative visible cervical spine. Sinuses/Orbits: Stable. Chronic right orbital floor  fracture. Otherwise negative. Other: Mastoids are clear. Visible internal auditory structures appear normal. Negative visible scalp and face. IMPRESSION: 1. Intrinsic T1 hyperintensity in the bilateral globus pallidus, likely due to hepatic insufficiency, cirrhosis. Correlation with serum ammonia levels is recommended. 2. No superimposed acute intracranial abnormality. Chronic nonspecific cerebral volume loss and chronic white matter signal changes are stable since 2022 and nonspecific. Electronically Signed   By: VEAR Hurst M.D.   On: 02/29/2024 05:59   CT ANKLE RIGHT WO CONTRAST Result Date: 02/28/2024 CLINICAL DATA:  Right ankle and foot bone and soft tissue infection. Original injury was 12/18/2023. ORIF performed 01/06/2024 for a bimalleolar fracture with  dislocation. Fibular sideplate fixation plating was subsequently removed 02/04/2024. He presents with worsening pain and a draining open wound laterally. EXAM: CT OF THE RIGHT ANKLE WITHOUT CONTRAST TECHNIQUE: Multidetector CT imaging of the right ankle was performed according to the standard protocol. Multiplanar CT image reconstructions were also generated. RADIATION DOSE REDUCTION: This exam was performed according to the departmental dose-optimization program which includes automated exposure control, adjustment of the mA and/or kV according to patient size and/or use of iterative reconstruction technique. COMPARISON:  Right ankle series from today, right ankle series 2 weeks postoperative 01/18/2024. There are no intervening studies. FINDINGS: Bones/Joint/Cartilage There is a marked interval worsening since 01/18/2024. Evidence of erosive tibiotalar septic arthropathy and joint collapse, with fluid and bony/calcific debris in the joint space. Erosive changes extend over the talar fall dome and portions of the tibial plafond. Interval bone density loss is seen in the distal tibia, the talus, and the of fibular/malleolar fracture fragment. There is removed  lateral fibular sideplate hardware, with increased lateral and slight cephalad displacement of the lateral malleolar fracture fragment up to nearly 1 shaft width laterally, with increased lateral subluxation of the talus of up to 1.5 cm with respect to the medial edge of the tibial plafond. Two medial malleolar screws remain in place, both of which demonstrate perihardware lucency consistent with loosening and both of which have backed out from the bone about 6 mm. There is increasing gapping between the medial malleolar fracture fragment in the parent bone now up to 4 mm as well. Metallic transsyndesmotic cable fixation plate remains medially and has also increasingly separated from the tibia. There are widespread periosteal amorphous/dystrophic calcifications around the distal tibia and fibula, most if not all of which is probably periostitis related to osteomyelitis. Small scattered cortical erosions are noted over posterior malleolus, medial distal tibial metaphysis, and the lateral malleolar fracture fragment in addition to the tibiotalar joint surface erosions. There are no overt destructive changes to the calcaneus, midfoot and visualized proximal metatarsals. Ligaments Suboptimally assessed by CT. Muscles and Tendons There is diffuse edema in the distal foreleg muscles which could be reactive or due to myositis. The plantar intrinsic foot musculature is unremarkable. Tendons are difficult to evaluate due to the degree of surrounding edema but are grossly intact as far as seen. No intramuscular abscess or focal fluid collection is seen, no intramuscular soft tissue gas. Soft tissues Diffuse moderate to severe edema. There soft tissue ulceration noted laterally at the level of the distal fibular fracture, the ulcer crater extending almost down to the bone. There is overlying bandaging, probably some packing material in it. There is a the more shallow surface ulceration medially at the level of the medial  malleolar fracture. No underlying localizing fluid collection or soft tissue gas is seen. IMPRESSION: 1. Marked interval worsening since 01/18/2024. 2. New erosive tibiotalar arthropathy and joint collapse, almost certainly on a septic basis, with fluid and bony/calcific debris in the joint space. 3. Interval bone density loss in the distal tibia, talus, and lateral malleolar fracture fragment. 4. Increased lateral and slight cephalad displacement of the lateral malleolar fracture fragment up to nearly 1 shaft width laterally, with increased lateral subluxation of the talus. 5. Two medial malleolar screws remain, both with perihardware lucency consistent with loosening and both of which have backed out about 6 mm. 6. Metallic transsyndesmotic cable fixation plate remains medially and has also increasingly separated from the tibia. 7. Widespread periosteal amorphous/dystrophic calcifications around the distal tibia and fibula,  most if not all of which is probably periostitis related to osteomyelitis. Scattered tiny cortical erosions. 8. Diffuse edema in the distal foreleg muscles which could be reactive or due to myositis. 9. Soft tissue ulceration laterally at the level of the distal fibular fracture, the ulcer crater extending almost down to the bone. 10. More shallow surface ulceration medially at the level of the medial malleolar fracture. 11. No underlying localizing fluid collection or soft tissue gas is seen. Electronically Signed   By: Francis Quam M.D.   On: 02/28/2024 23:53   CT Head Wo Contrast Result Date: 02/28/2024 CLINICAL DATA:  Status post seizure. EXAM: CT HEAD WITHOUT CONTRAST TECHNIQUE: Contiguous axial images were obtained from the base of the skull through the vertex without intravenous contrast. RADIATION DOSE REDUCTION: This exam was performed according to the departmental dose-optimization program which includes automated exposure control, adjustment of the mA and/or kV according to  patient size and/or use of iterative reconstruction technique. COMPARISON:  None Available. FINDINGS: Brain: There is generalized cerebral atrophy with widening of the extra-axial spaces and ventricular dilatation. There are areas of decreased attenuation within the white matter tracts of the supratentorial brain, consistent with microvascular disease changes. Vascular: No hyperdense vessel or unexpected calcification. Skull: Normal. Negative for fracture or focal lesion. Sinuses/Orbits: No acute finding. Other: None. IMPRESSION: 1. Generalized cerebral atrophy with chronic white matter small vessel ischemic changes. 2. No acute intracranial abnormality. Electronically Signed   By: Suzen Dials M.D.   On: 02/28/2024 18:15   DG Ankle Complete Right Result Date: 02/28/2024 CLINICAL DATA:  Evaluate for osteomyelitis. Right ankle surgery 1 month ago. Hardware removal 2 weeks ago. There is a large knot on the medial ankle with an open wound on the lateral ankle. EXAM: RIGHT ANKLE - COMPLETE 3+ VIEW COMPARISON:  01/18/2024 FINDINGS: Healing fractures of the medial and lateral malleolus with residual fracture lines present possibly indicating nonunion. Interval removal of a previous lateral plate and screw. Two fixation screws still demonstrated in the medial malleolus. There is about 9 mm lateral displacement of the talus and of the distal fibular fragment with respect to the tibia. Displacement is new since the previous study which demonstrated near anatomic alignment. Soft tissue gas demonstrated over the medial and lateral malleolus suggesting soft tissue ulceration or infection. Periosteal reaction in bone loss demonstrated at the lateral malleolar fracture. This could indicate osteomyelitis or bone healing. Mild diffuse soft tissue swelling. IMPRESSION: 1. Previous medial and lateral malleolus fractures with residual fracture lines may indicate nonunion. 2. Interval removal of lateral plate and screw. 3.  Interval lateral displacement of the talus and the distal lateral malleolar fragment with respect to the tibia. 4. Soft tissue ulceration and soft tissue gas. Underlying periosteal reaction in bone loss in the lateral malleolus could indicate osteomyelitis or bone healing. Electronically Signed   By: Elsie Gravely M.D.   On: 02/28/2024 17:11   DG Chest Port 1 View Result Date: 02/28/2024 CLINICAL DATA:  Sepsis. EXAM: PORTABLE CHEST 1 VIEW COMPARISON:  12/18/2023 FINDINGS: The heart size and mediastinal contours are within normal limits. Both lungs are clear. The visualized skeletal structures are unremarkable. IMPRESSION: No active disease. Electronically Signed   By: Norleen DELENA Kil M.D.   On: 02/28/2024 16:57     The results of significant diagnostics from this hospitalization (including imaging, microbiology, ancillary and laboratory) are listed below for reference.     Microbiology: Recent Results (from the past 240 hours)  Culture, blood (Routine  X 2) w Reflex to ID Panel     Status: None (Preliminary result)   Collection Time: 02/29/24 12:19 AM   Specimen: BLOOD RIGHT HAND  Result Value Ref Range Status   Specimen Description BLOOD RIGHT HAND  Final   Special Requests   Final    BOTTLES DRAWN AEROBIC AND ANAEROBIC Blood Culture adequate volume   Culture   Final    NO GROWTH 3 DAYS Performed at [facility], [address] [place], [place] [address]    Report Status PENDING  Incomplete  Culture, blood (Routine X 2) w Reflex to ID Panel     Status: None (Preliminary result)   Collection Time: 02/29/24  6:44 AM   Specimen: BLOOD RIGHT HAND  Result Value Ref Range Status   Specimen Description BLOOD RIGHT HAND  Final   Special Requests   Final    BOTTLES DRAWN AEROBIC AND ANAEROBIC Blood Culture adequate volume   Culture   Final    NO GROWTH 3 DAYS Performed at [facility], [address] [place], [place] [address]    Report Status PENDING  Incomplete      Labs: BNP (last 3 results) No results for input(s): BNP in the last 8760 hours. Basic Metabolic Panel: Recent Labs  Lab 02/29/24 1119 03/01/24 0740 03/02/24 0454 03/03/24 0420 03/03/24 0901 03/04/24 0304  NA 138 132* 130* 131*  --  131*  K 3.5 3.0* 4.3 3.8  --  4.0  CL 105 100 100 99  --  102  CO2 25 24 24 22   --  20*  GLUCOSE 240* 199* 274* 234*  --  290*  BUN <5* <5* <5* 6  --  7  CREATININE 0.50* 0.50* 0.51* 0.52*  --  0.57*  CALCIUM  8.4* 7.9* 8.3* 8.3*  --  8.2*  MG  --  1.2* 1.5* 1.4* 1.4* 1.5*  PHOS  --  2.9 2.6 3.0 3.2 2.8   Liver Function Tests: Recent Labs  Lab 02/28/24 1759 02/29/24 1119 03/01/24 0740 03/02/24 0454 03/03/24 0420  AST 36 36 42* 47* 36  ALT 20 18 18 20 18   ALKPHOS 277* 225* 235* 237* 228*  BILITOT 2.2* 2.6* 3.6* 2.3* 2.0*  PROT 9.1* 7.8 7.5 7.4 7.3  ALBUMIN 2.8* 2.2* 2.0* 2.1* 2.2*   No results for input(s): LIPASE, AMYLASE in the last 168 hours. Recent Labs  Lab 02/29/24 0019  AMMONIA 90*   CBC: Recent Labs  Lab 02/28/24 1557 02/28/24 1627 02/29/24 1119 03/01/24 0740 03/02/24 0454 03/03/24 0420 03/03/24 0901 03/04/24 0304  WBC 6.3  --  5.8 4.4 4.8 5.6 7.6 6.2  NEUTROABS 4.0  --  4.4 2.4 2.5 3.8  --   --   HGB 6.4*   < > 7.5* 8.2* 7.8* 7.7* 9.1* 7.8*  HCT 20.8*   < > 23.6* 24.9* 24.4* 24.3* 28.5* 24.8*  MCV 76.2*  --  75.4* 73.7* 74.4* 75.5* 75.4* 76.3*  PLT 156  --  117* 115* 134* 128* 193 123*   < > = values in this interval not displayed.   Cardiac Enzymes: No results for input(s): CKTOTAL, CKMB, CKMBINDEX, TROPONINI in the last 168 hours. BNP: Invalid input(s): POCBNP CBG: Recent Labs  Lab 03/03/24 0801 03/03/24 1125 03/03/24 1530 03/03/24 2112 03/04/24 0745  GLUCAP 243* 402* 220* 221* 289*   D-Dimer No results for input(s): DDIMER in the last 72 hours. Hgb A1c No results for input(s): HGBA1C in the last 72 hours. Lipid  Profile No results for input(s): CHOL, HDL, LDLCALC,  TRIG, CHOLHDL, LDLDIRECT in the last 72 hours. Thyroid  function studies No results for input(s): TSH, T4TOTAL, T3FREE, THYROIDAB in the last 72 hours.  Invalid input(s): FREET3 Anemia work up No results for input(s): VITAMINB12, FOLATE, FERRITIN, TIBC, IRON , RETICCTPCT in the last 72 hours. Urinalysis    Component Value Date/Time   COLORURINE STRAW (A) 02/28/2024 1557   APPEARANCEUR CLEAR 02/28/2024 1557   LABSPEC 1.023 02/28/2024 1557   PHURINE 6.0 02/28/2024 1557   GLUCOSEU >=500 (A) 02/28/2024 1557   HGBUR SMALL (A) 02/28/2024 1557   BILIRUBINUR NEGATIVE 02/28/2024 1557   KETONESUR NEGATIVE 02/28/2024 1557   PROTEINUR NEGATIVE 02/28/2024 1557   NITRITE NEGATIVE 02/28/2024 1557   LEUKOCYTESUR NEGATIVE 02/28/2024 1557   Sepsis Labs Recent Labs  Lab 03/02/24 0454 03/03/24 0420 03/03/24 0901 03/04/24 0304  WBC 4.8 5.6 7.6 6.2   Microbiology Recent Results (from the past 240 hours)  Culture, blood (Routine X 2) w Reflex to ID Panel     Status: None (Preliminary result)   Collection Time: 02/29/24 12:19 AM   Specimen: BLOOD RIGHT HAND  Result Value Ref Range Status   Specimen Description BLOOD RIGHT HAND  Final   Special Requests   Final    BOTTLES DRAWN AEROBIC AND ANAEROBIC Blood Culture adequate volume   Culture   Final    NO GROWTH 3 DAYS Performed at [facility], [address] [place], [place] [address]    Report Status PENDING  Incomplete  Culture, blood (Routine X 2) w Reflex to ID Panel     Status: None (Preliminary result)   Collection Time: 02/29/24  6:44 AM   Specimen: BLOOD RIGHT HAND  Result Value Ref Range Status   Specimen Description BLOOD RIGHT HAND  Final   Special Requests   Final    BOTTLES DRAWN AEROBIC AND ANAEROBIC Blood Culture adequate volume   Culture   Final    NO GROWTH 3 DAYS Performed at [facility], [address], [place], [place] [address]    Report Status PENDING  Incomplete     Time  coordinating discharge:  I have spent 35 minutes face to face with the patient and on the ward discussing the patients care, assessment, plan and disposition with other care givers. >50% of the time was devoted counseling the patient about the risks and benefits of treatment/Discharge disposition and coordinating care.   SIGNED:   Burgess JAYSON Dare, MD  [facility] 03/04/2024, 2:08 PM   If 7PM-7AM, please contact night-coverage

## 2024-03-04 NOTE — Progress Notes (Signed)
 PROGRESS NOTE    Gary Mitchell  FMW:[id number] DOB: 25-Oct-1970 DOA: 02/28/2024 PCP: Delbert Clam, MD    Brief Narrative:    53 yo with hx etoh abuse, cirrhosis, atrial fib on eliquis , T2DM and multiple other medical issues with recent closed R ankle fracture s/p ORIF 01/06/2024.  He was admitted by orthopedics with infected hardware 7/30 and had surgery per ortho with removal of hardware.  He was discharged on 8/1.  He's returned in the setting of being found down after a suspected seizure and right lower extremity swelling/infection.     Dr. Harden recommended transtibial amputation, he's currently refusing.  ID has been consulted for abx recs.  GI was consulted for his anemia and he's s/p EGD with an oozing gastric ulcer (clipped) and grade 1 varices (no stigmata bleeding).     Patient was placed on 8 weeks of PPI twice daily followed by daily.  Plans for outpatient EUS. Patient declining amputation, seen by ID recommending 6 weeks of p.o. antibiotics doxycycline  and Augmentin .  Will discharge the patient.    Assessment & Plan:  Principal Problem:   AMS (altered mental status) Active Problems:   Seizure (HCC)   Subacute osteomyelitis of right ankle (HCC)   Acute gastric ulcer with hemorrhage   Hyperglycemia     Right Lower Extremity Infection  Infected Hardware  Septic Arthritis  Osteomyelitis  Myositis  CT scan is showing concerns for right ankle hardware complicated by osteomyelitis/infection.  Orthopedic is recommending amputation but patient currently refusing.  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  Seen by infectious disease now recommending 6 weeks of p.o. doxycycline  and Augmentin .  Outptn ID appt 9/18 Can follow up with Outpatient with Dr Harden if he is interested.   Seizures Acute Metabolic Encephalopathy Question of possible seizure.  MRI brain showing T1 hyperintensity in the bilateral globus pallidus likely from hepatic insufficiency/cirrhosis.   EEG is negative for seizure-like activity.  Neurology recommending Keppra  1000 mg twice daily.   Per [place] statutes, patients with seizures are not allowed to drive until  they have been seizure-free for six months. Use caution when using heavy equipment or power tools. Avoid working on ladders or at heights. Take showers instead of baths. Ensure the water  temperature is not too high on the home water  heater. Do not go swimming alone. When caring for infants or small children, sit down when holding, feeding, or changing them to minimize risk of injury to the child in the event you have a seizure. Also, Maintain good sleep hygiene. Avoid alcohol.   Anemia Hb 6.4 at presentation (was 9.9 at the end of July).  Seen by [facility].  Overall multifactorial in nature.  EGD on 8/25 showing grade 1 esophageal varices with slow oozing small ulcer.  Biopsies of these were taken.  For now recommending PPI twice daily for 8 weeks followed by daily.  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  Okay to resume Eliquis .  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  Sliding scale and Accu-Cheks   Anion Gap Metabolic Acidosis resolved   Hypomagnesemia Replace and follow     Left Arm Swelling Distal Ulnar Metaphyseal Fracture Discussed with orthopedics, plan for sugar tong splint, NWB, and follow with  Dr. Colman in 1-2 weeks   Homelessness TOC       DVT prophylaxis: Eliquis  Code Status: full Family Communication: none Disposition:    Status is: Inpatient Remains inpatient appropriate because: need for continued inpatient care     PT Follow up Recs: Outpatient Pt8/26/2025 1115  Subjective: Resting comfortably no complaints.   Examination:  General exam: Appears calm  and comfortable  Respiratory system: Clear to auscultation. Respiratory effort normal. Cardiovascular system: S1 & S2 heard, RRR. No JVD, murmurs, rubs, gallops or clicks. No pedal edema. Gastrointestinal system: Abdomen is nondistended, soft and nontender. No organomegaly or masses felt. Normal bowel sounds heard. Central nervous system: Alert and oriented. No focal neurological deficits. Extremities: Symmetric 5 x 5 power. Skin: No rashes, lesions or ulcers Psychiatry: Judgement and insight appear normal. Mood & affect appropriate.                Diet Orders (From admission, onward)     Start     Ordered   03/01/24 1224  Diet Carb Modified Fluid consistency: Thin; Room service appropriate? Yes  Diet effective now       Question Answer Comment  Diet-HS Snack? Nothing   Calorie Level Medium 1600-2000   Fluid consistency: Thin   Room service appropriate? Yes      03/01/24 1224            Objective: Vitals:   03/03/24 2306 03/04/24 0326 03/04/24 0744 03/04/24 0746  BP: (!) 139/94 134/84  (!) 145/96  Pulse: 96     Resp: 20 20  20   Temp: 98.7 F (37.1 C) 98.4 F (36.9 C) 97.6 F (36.4 C) 97.6 F (36.4 C)  TempSrc: Oral Oral  Oral  SpO2: 98% 100%    Weight:      Height:        Intake/Output Summary (Last 24 hours) at 03/04/2024 1035 Last data filed at 03/04/2024 0739 Gross per 24 hour  Intake 1450 ml  Output 2550 ml  Net -1100 ml   Filed Weights   02/28/24 2200 02/29/24 0105 03/01/24 0821  Weight: 64 kg 65 kg 65 kg    Scheduled Meds:  amoxicillin -clavulanate  1 tablet Oral Q12H   apixaban   2.5 mg Oral BID   doxycycline   100 mg Oral Q12H   folic acid   1 mg Oral Daily   insulin  aspart  0-15 Units Subcutaneous TID WC   insulin  aspart  5 Units Subcutaneous TID WC   insulin  glargine  25 Units Subcutaneous Daily   lactulose   20 g Oral Daily   levETIRAcetam   1,000 mg Oral BID   multivitamin with minerals  1 tablet Oral Q24H   pantoprazole  (PROTONIX ) IV   40 mg Intravenous Q12H   thiamine   100 mg Oral Daily   Or   thiamine   100 mg Intravenous Daily   Continuous Infusions:  magnesium  sulfate bolus IVPB     magnesium  sulfate bolus IVPB 4 g (03/04/24 0851)    Nutritional status     Body mass index is 23.13 kg/m.  Data Reviewed:   CBC: Recent Labs  Lab 02/28/24 1557 02/28/24 1627 02/29/24 1119 03/01/24 0740 03/02/24 0454 03/03/24 0420 03/03/24 0901 03/04/24 0304  WBC 6.3  --  5.8 4.4 4.8 5.6 7.6 6.2  NEUTROABS 4.0  --  4.4 2.4 2.5 3.8  --   --   HGB 6.4*   < > 7.5* 8.2* 7.8* 7.7* 9.1* 7.8*  HCT 20.8*   < > 23.6*  24.9* 24.4* 24.3* 28.5* 24.8*  MCV 76.2*  --  75.4* 73.7* 74.4* 75.5* 75.4* 76.3*  PLT 156  --  117* 115* 134* 128* 193 123*   < > = values in this interval not displayed.   Basic Metabolic Panel: Recent Labs  Lab 02/29/24 1119 03/01/24 0740 03/02/24 0454 03/03/24 0420 03/03/24 0901 03/04/24 0304  NA 138 132* 130* 131*  --  131*  K 3.5 3.0* 4.3 3.8  --  4.0  CL 105 100 100 99  --  102  CO2 25 24 24 22   --  20*  GLUCOSE 240* 199* 274* 234*  --  290*  BUN <5* <5* <5* 6  --  7  CREATININE 0.50* 0.50* 0.51* 0.52*  --  0.57*  CALCIUM  8.4* 7.9* 8.3* 8.3*  --  8.2*  MG  --  1.2* 1.5* 1.4* 1.4* 1.5*  PHOS  --  2.9 2.6 3.0 3.2 2.8   GFR: Estimated Creatinine Clearance: 98.6 mL/min (A) (by C-G formula based on SCr of 0.57 mg/dL (L)). Liver Function Tests: Recent Labs  Lab 02/28/24 1759 02/29/24 1119 03/01/24 0740 03/02/24 0454 03/03/24 0420  AST 36 36 42* 47* 36  ALT 20 18 18 20 18   ALKPHOS 277* 225* 235* 237* 228*  BILITOT 2.2* 2.6* 3.6* 2.3* 2.0*  PROT 9.1* 7.8 7.5 7.4 7.3  ALBUMIN 2.8* 2.2* 2.0* 2.1* 2.2*   No results for input(s): LIPASE, AMYLASE in the last 168 hours. Recent Labs  Lab 02/29/24 0019  AMMONIA 90*   Coagulation Profile: Recent Labs  Lab 02/28/24 1557  INR 1.4*   Cardiac Enzymes: No results for input(s): CKTOTAL, CKMB, CKMBINDEX, TROPONINI in the last 168  hours. BNP (last 3 results) No results for input(s): PROBNP in the last 8760 hours. HbA1C: No results for input(s): HGBA1C in the last 72 hours. CBG: Recent Labs  Lab 03/03/24 0801 03/03/24 1125 03/03/24 1530 03/03/24 2112 03/04/24 0745  GLUCAP 243* 402* 220* 221* 289*   Lipid Profile: No results for input(s): CHOL, HDL, LDLCALC, TRIG, CHOLHDL, LDLDIRECT in the last 72 hours. Thyroid  Function Tests: No results for input(s): TSH, T4TOTAL, FREET4, T3FREE, THYROIDAB in the last 72 hours. Anemia Panel: No results for input(s): VITAMINB12, FOLATE, FERRITIN, TIBC, IRON , RETICCTPCT in the last 72 hours. Sepsis Labs: Recent Labs  Lab 02/28/24 1627 02/28/24 2035 02/29/24 0020  LATICACIDVEN 7.3* 4.6* 3.0*    Recent Results (from the past 240 hours)  Culture, blood (Routine X 2) w Reflex to ID Panel     Status: None (Preliminary result)   Collection Time: 02/29/24 12:19 AM   Specimen: BLOOD RIGHT HAND  Result Value Ref Range Status   Specimen Description BLOOD RIGHT HAND  Final   Special Requests   Final    BOTTLES DRAWN AEROBIC AND ANAEROBIC Blood Culture adequate volume   Culture   Final    NO GROWTH 3 DAYS Performed at [facility], [address], [place], [place] [address]    Report Status PENDING  Incomplete  Culture, blood (Routine X 2) w Reflex to ID Panel     Status: None (Preliminary result)   Collection Time: 02/29/24  6:44 AM   Specimen: BLOOD RIGHT HAND  Result Value Ref Range Status   Specimen Description BLOOD RIGHT HAND  Final   Special Requests   Final    BOTTLES DRAWN AEROBIC AND ANAEROBIC Blood Culture adequate volume   Culture   Final    NO GROWTH 3 DAYS Performed  at [facility], [address], [place], [place] [address]    Report Status PENDING  Incomplete         Radiology Studies: No results found.         LOS: 5 days   Time spent= 35 mins    Burgess JAYSON Dare, MD [facility]  If 7PM-7AM, please contact night-coverage  03/04/2024, 10:35 AM

## 2024-03-04 NOTE — Plan of Care (Signed)
  Problem: Education: Goal: Ability to describe self-care measures that may prevent or decrease complications (Diabetes Survival Skills Education) will improve Outcome: Adequate for Discharge Goal: Individualized Educational Video(s) Outcome: Adequate for Discharge   Problem: Coping: Goal: Ability to adjust to condition or change in health will improve Outcome: Adequate for Discharge   Problem: Fluid Volume: Goal: Ability to maintain a balanced intake and output will improve Outcome: Adequate for Discharge   Problem: Health Behavior/Discharge Planning: Goal: Ability to identify and utilize available resources and services will improve Outcome: Adequate for Discharge Goal: Ability to manage health-related needs will improve Outcome: Adequate for Discharge   Problem: Metabolic: Goal: Ability to maintain appropriate glucose levels will improve Outcome: Adequate for Discharge   Problem: Nutritional: Goal: Maintenance of adequate nutrition will improve Outcome: Adequate for Discharge Goal: Progress toward achieving an optimal weight will improve Outcome: Adequate for Discharge   Problem: Skin Integrity: Goal: Risk for impaired skin integrity will decrease Outcome: Adequate for Discharge   Problem: Tissue Perfusion: Goal: Adequacy of tissue perfusion will improve Outcome: Adequate for Discharge   Problem: Education: Goal: Knowledge of General Education information will improve Description: Including pain rating scale, medication(s)/side effects and non-pharmacologic comfort measures Outcome: Adequate for Discharge   Problem: Health Behavior/Discharge Planning: Goal: Ability to manage health-related needs will improve Outcome: Adequate for Discharge   Problem: Clinical Measurements: Goal: Ability to maintain clinical measurements within normal limits will improve Outcome: Adequate for Discharge Goal: Will remain free from infection Outcome: Adequate for Discharge Goal:  Diagnostic test results will improve Outcome: Adequate for Discharge Goal: Respiratory complications will improve Outcome: Adequate for Discharge Goal: Cardiovascular complication will be avoided Outcome: Adequate for Discharge   Problem: Activity: Goal: Risk for activity intolerance will decrease Outcome: Adequate for Discharge   Problem: Nutrition: Goal: Adequate nutrition will be maintained Outcome: Adequate for Discharge   Problem: Coping: Goal: Level of anxiety will decrease Outcome: Adequate for Discharge   Problem: Elimination: Goal: Will not experience complications related to bowel motility Outcome: Adequate for Discharge Goal: Will not experience complications related to urinary retention Outcome: Adequate for Discharge   Problem: Pain Managment: Goal: General experience of comfort will improve and/or be controlled Outcome: Adequate for Discharge   Problem: Safety: Goal: Ability to remain free from injury will improve Outcome: Adequate for Discharge   Problem: Skin Integrity: Goal: Risk for impaired skin integrity will decrease Outcome: Adequate for Discharge   Problem: Acute Rehab PT Goals(only PT should resolve) Goal: Patient Will Transfer Sit To/From Stand Outcome: Adequate for Discharge Goal: Pt Will Ambulate Outcome: Adequate for Discharge Goal: Pt Will Go Up/Down Stairs Outcome: Adequate for Discharge

## 2024-03-04 NOTE — Progress Notes (Signed)
 Explained discharge instructions to patient. Reviewed follow up appointment and next medication administration times. Also reviewed education. Patient verbalized having an understanding for instructions given. All belongings are in the patient's possession. Will pick up TOC meds on the way to the discharge. No other needs verbalized. Transporting downstairs for discharge.

## 2024-03-04 NOTE — TOC Transition Note (Signed)
 Transition of Care (TOC) - Discharge Note Rayfield Gobble RN,BSN Inpatient Care Management Unit 4NP (Non Trauma)- RN Case Manager See Treatment Team for direct Phone #   Patient Details  Name: Gary Mitchell MRN: [id number] Date of Birth: [birth year]  Transition of Care [facility]) CM/SW Contact:  Gobble Rayfield Hurst, RN Phone Number: 03/04/2024, 12:27 PM   Clinical Narrative:    Pt stable for transition home today, CM spoke with pt at bedside to discuss transition needs.   Per pt he plans to go back to his cousins home to stay for now (his RW and crutches are there), he is going to try to see if he can stay with [facility] as he doesn't want to stay with cousin for long as there are too many people there.   Discussed outpt PT- pt declines referral- voiced that he has done outpt before and did not feel they helped much (all they did was give me a rubber band to use)-  Also discussed wheelchair issues- pt states that one of the rubber wheels keeps coming off the rim- he states that it is not at his cousins home but another address-  [address], [place] [address] - explained to pt that he would need to get w/c to [facility] store for them to look at and see if chair could be repaired.  Pt not sure he has anyone that can take him to do this, he also reports that he has a moped that he uses currently to get around.   PCP is at [facility], pt uses pharmacy there as well.   Pt will need assistance with transportation home as reports his cousin does not drive, d/c lounge to assist with taxi voucher.   No further IP CM needs noted on discharge.   TOC pharmacy to fill meds   Final next level of care: Home/Self Care Barriers to Discharge: No Barriers Identified   Patient Goals and CMS Choice Patient states their goals for this hospitalization and ongoing recovery are:: return home   Choice offered to / list presented to : NA      Discharge Placement               Home         Discharge Plan and Services Additional resources added to the After Visit Summary for     Discharge Planning Services: CM Consult Post Acute Care Choice: NA          DME Arranged: N/A DME Agency: NA       HH Arranged: NA HH Agency: NA        Social Drivers of Health (SDOH) Interventions SDOH Screenings   Food Insecurity: Food Insecurity Present (02/29/2024)  Housing: High Risk (02/29/2024)  Transportation Needs: Unmet Transportation Needs (02/29/2024)  Utilities: Patient Declined (02/29/2024)  Recent Concern: Utilities - At Risk (02/04/2024)  Alcohol Screen: Low Risk  (05/13/2023)  Depression (PHQ2-9): High Risk (11/17/2023)  Financial Resource Strain: High Risk (04/11/2023)  Physical Activity: Patient Declined (05/13/2023)  Social Connections: Socially Isolated (05/13/2023)  Stress: No Stress Concern Present (05/13/2023)  Tobacco Use: Medium Risk (02/29/2024)  Health Literacy: Adequate Health Literacy (05/13/2023)     Readmission Risk Interventions    03/04/2024   12:26 PM 01/06/2024    9:34 AM  Readmission Risk Prevention Plan  Transportation Screening Complete Complete  PCP or Specialist Appt within 5-7 Days  Complete  Home Care Screening  Complete  Medication Review (RN CM)  Complete  HRI or  Home Care Consult Complete   Social Work Consult for Recovery Care Planning/Counseling Complete   Palliative Care Screening Not Applicable   Medication Review Oceanographer) Complete

## 2024-03-05 ENCOUNTER — Other Ambulatory Visit: Payer: Self-pay

## 2024-03-05 ENCOUNTER — Telehealth: Payer: Self-pay | Admitting: *Deleted

## 2024-03-05 ENCOUNTER — Telehealth: Payer: Self-pay

## 2024-03-05 LAB — CULTURE, BLOOD (ROUTINE X 2)
Culture: NO GROWTH
Culture: NO GROWTH
Special Requests: ADEQUATE
Special Requests: ADEQUATE

## 2024-03-05 NOTE — Transitions of Care (Post Inpatient/ED Visit) (Signed)
 03/05/2024  Name: Gary Mitchell MRN: [id number] DOB: Jul 23, 1970  Today's TOC FU Call Status: Today's TOC FU Call Status:: Successful TOC FU Call Completed TOC FU Call Complete Date: 03/05/24 Patient's Name and Date of Birth confirmed.  Transition Care Management Follow-up Telephone Call Date of Discharge: 03/04/24 Discharge Facility: [facility]) Type of Discharge: Inpatient Admission Primary Inpatient Discharge Diagnosis:: altered mental status How have you been since you were released from the hospital?: Same Any questions or concerns?: Yes Patient Questions/Concerns:: Patient expresses concern regarding discussion to amputate his foot. Patient Questions/Concerns Addressed: Other: (RNCM advised contacting Orthopedic provider to discuss concerns. Patient planning to contact Ortho provider today.)  Items Reviewed: Did you receive and understand the discharge instructions provided?: Yes Medications obtained,verified, and reconciled?: No Medications Not Reviewed Reasons:: Other: (Call ended, RNCM unable to reconnect.) Dietary orders reviewed?: Yes Type of Diet Ordered:: Heart Healthy Do you have support at home?: Yes People in Home [RPT]: other relative(s) Name of Support/Comfort Primary Source: living with uncle currently  Medications Reviewed Today:RNCM unable to complete Medication review with patient. Call ended and RNCM unable to reconnect with patient. Medications Reviewed Today     Reviewed by Lucky Andrea LABOR, RN (Registered Nurse) on 03/05/24 at [phone]  Med List Status: <None>   Medication Order Taking? Sig Documenting Provider Last Dose Status Informant  acetaminophen  (TYLENOL ) 500 MG tablet [id number] No Take 1,500 mg by mouth daily as needed for moderate pain (pain score 4-6), fever or headache. [provider] 02/28/2024 Morning Active Self, Pharmacy Records           Med Note (LEE, NICOLE   Sat Feb 28, 2024 10:14 PM) Patient states he only takes this dosage  once per day.  amoxicillin -clavulanate (AUGMENTIN ) 875-125 MG tablet [id number]  Take 1 tablet by mouth every 12 (twelve) hours. Caleen Burgess BROCKS, MD  Active   apixaban  (ELIQUIS ) 2.5 MG TABS tablet [id number] No Take 1 tablet (2.5 mg total) by mouth 2 (two) times daily for 28 days. Drusilla Sabas RAMAN, MD 02/28/2024  5:00 AM Active Self, Pharmacy Records  atorvastatin  (LIPITOR) 20 MG tablet [id number] No Take 1 tablet (20 mg total) by mouth daily. Laurence Locus, DO Unknown Active Self, Pharmacy Records           Med Note MARISA, NATHANEL SAILOR   Mon Jan 05, 2024  2:10 PM)    Continuous Glucose Sensor (FREESTYLE LIBRE 3 PLUS SENSOR) [place] [id number] No Change sensor every 15 days. Use to check blood sugar continuously. Newlin, Enobong, MD 02/28/2024 Active Self, Pharmacy Records  doxycycline  (VIBRA -TABS) 100 MG tablet [id number]  Take 1 tablet (100 mg total) by mouth every 12 (twelve) hours. Amin, Ankit C, MD  Active   empagliflozin  (JARDIANCE ) 10 MG TABS tablet [id number] No Take 1 tablet (10 mg total) by mouth daily before breakfast. Delbert Clam, MD Past Week Active Self, Pharmacy Records  folic acid  (FOLVITE ) 1 MG tablet [id number]  Take 1 tablet (1 mg total) by mouth daily. Caleen Burgess BROCKS, MD  Active   ibuprofen  (ADVIL ) 200 MG tablet [id number] No Take 400 mg by mouth every 6 (six) hours as needed for mild pain (pain score 1-3) or moderate pain (pain score 4-6). [provider] 02/28/2024 Morning Active Self, Pharmacy Records           Med Note LEOBARDO, NICOLE   Sat Feb 28, 2024 10:24 PM) Patient knows he should not take this medication.   insulin  glargine (LANTUS  SOLOSTAR) 100 UNIT/ML  Solostar Pen [id number] No Inject 30 Units into the skin daily. Trine Raynell Moder, MD 02/28/2024 Morning Active Self, Pharmacy Records           Med Note LEOBARDO, NICOLE   Sat Feb 28, 2024 10:18 PM)    insulin  lispro (HUMALOG  KWIKPEN) 100 UNIT/ML KwikPen [id number] No Inject 10 Units into the skin 3 (three) times daily. Trine Raynell Moder, MD 02/28/2024 Morning Active Self, Pharmacy Records           Med Note LEOBARDO, NICOLE   Sat Feb 28, 2024 10:18 PM)    lactulose  (CHRONULAC ) 10 GM/15ML solution [id number] No Take 45 mLs (30 g total) by mouth as needed for mild constipation. Beather Delon Gibson, [place] Unknown Active Self, Pharmacy Records  levETIRAcetam  (KEPPRA ) 1000 MG tablet [id number]  Take 1 tablet (1,000 mg total) by mouth 2 (two) times daily. Caleen Burgess BROCKS, MD  Active   Menthol-Methyl Salicylate (MUSCLE RUB) 10-15 % CREA [id number] No Apply 1 Application topically as needed for muscle pain (arm pain). [provider] 02/28/2024 Morning Active Self, Pharmacy Records  metFORMIN  (GLUCOPHAGE ) 500 MG tablet [id number] No Take 2 tablets (1,000 mg total) by mouth 2 (two) times daily with a meal. Delbert Clam, MD 02/27/2024 Morning Active Self, Pharmacy Records           Med Note MARISA, NATHANEL SAILOR   Mon Jan 05, 2024  3:15 PM)    metoprolol  succinate (TOPROL -XL) 25 MG 24 hr tablet [id number] No Take 0.5 tablets (12.5 mg total) by mouth daily. Drusilla Sabas RAMAN, MD 02/27/2024 Morning Active Self, Pharmacy Records  Multiple Vitamin (MULTIVITAMIN WITH MINERALS) TABS tablet [id number]  Take 1 tablet by mouth daily. Caleen Burgess BROCKS, MD  Active   pantoprazole  (PROTONIX ) 40 MG tablet [id number]  Take 1 tablet (40 mg total) by mouth 2 (two) times daily before a meal for 60 days, THEN 1 tablet (40 mg total) daily before breakfast. Amin, Ankit C, MD  Active   thiamine  (VITAMIN B1) 100 MG tablet [id number]  Take 1 tablet (100 mg total) by mouth daily. Caleen Burgess BROCKS, MD  Active   Med List Note LEOBARDO Garre, CPhT 02/28/24 2228): Patient is non-compliant with taking his medications.             Home Care and Equipment/Supplies: Were Home Health Services Ordered?: No Any new equipment or medical supplies ordered?: No  Functional Questionnaire: Do you need assistance with bathing/showering or dressing?: No Do you need assistance with meal  preparation?: No Do you need assistance with eating?: No Do you have difficulty maintaining continence: No Do you need assistance with getting out of bed/getting out of a chair/moving?: No Do you have difficulty managing or taking your medications?: Yes (difficulty remembering to take medications)  Follow up appointments reviewed: PCP Follow-up appointment confirmed?: Yes Date of PCP follow-up appointment?: 03/22/24 Follow-up Provider: Dr. Newlin Specialist [facility] Follow-up appointment confirmed?: Yes Date of Specialist follow-up appointment?: 03/25/24 Follow-Up Specialty Provider:: [facility] Do you need transportation to your follow-up appointment?: No (Patient will schedule transportation via [facility])  Patient ended call prior to [facility] completing TOC assessment. RNCM unable to reconnect with Mr. Ozburn.  Andrea Dimes RN, BSN [place]  [facility] RN Care Manager [phone]

## 2024-03-05 NOTE — Telephone Encounter (Signed)
 Per Dr Erwin Gary Mitchell needs to be seen for wrist fx. Tried calling Gary Mitchell to schedule. No answer and no voice mail

## 2024-03-09 ENCOUNTER — Encounter: Admitting: Orthopedic Surgery

## 2024-03-11 ENCOUNTER — Encounter: Admitting: Orthopedic Surgery

## 2024-03-19 DIAGNOSIS — Z419 Encounter for procedure for purposes other than remedying health state, unspecified: Secondary | ICD-10-CM | POA: Diagnosis not present

## 2024-03-22 ENCOUNTER — Other Ambulatory Visit: Payer: Self-pay

## 2024-03-22 ENCOUNTER — Encounter: Payer: Self-pay | Admitting: Family Medicine

## 2024-03-22 ENCOUNTER — Telehealth: Payer: Self-pay

## 2024-03-22 ENCOUNTER — Ambulatory Visit: Attending: Family Medicine | Admitting: Family Medicine

## 2024-03-22 VITALS — BP 120/74 | HR 110 | Ht 66.0 in | Wt 133.2 lb

## 2024-03-22 DIAGNOSIS — K7030 Alcoholic cirrhosis of liver without ascites: Secondary | ICD-10-CM | POA: Diagnosis not present

## 2024-03-22 DIAGNOSIS — R569 Unspecified convulsions: Secondary | ICD-10-CM

## 2024-03-22 DIAGNOSIS — Z794 Long term (current) use of insulin: Secondary | ICD-10-CM | POA: Diagnosis not present

## 2024-03-22 DIAGNOSIS — I480 Paroxysmal atrial fibrillation: Secondary | ICD-10-CM

## 2024-03-22 DIAGNOSIS — Z59812 Housing instability, housed, homelessness in past 12 months: Secondary | ICD-10-CM

## 2024-03-22 DIAGNOSIS — E1169 Type 2 diabetes mellitus with other specified complication: Secondary | ICD-10-CM | POA: Diagnosis not present

## 2024-03-22 DIAGNOSIS — M19071 Primary osteoarthritis, right ankle and foot: Secondary | ICD-10-CM

## 2024-03-22 LAB — POCT GLYCOSYLATED HEMOGLOBIN (HGB A1C): HbA1c, POC (controlled diabetic range): 9.6 % — AB (ref 0.0–7.0)

## 2024-03-22 MED ORDER — LANTUS SOLOSTAR 100 UNIT/ML ~~LOC~~ SOPN
32.0000 [IU] | PEN_INJECTOR | Freq: Every day | SUBCUTANEOUS | 3 refills | Status: DC
  Filled 2024-03-22: qty 27, 84d supply, fill #0
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
## 2024-03-22 NOTE — Patient Instructions (Addendum)
 VISIT SUMMARY:  You came in today for a follow-up on your diabetes management and foot infection. We discussed your current medications, challenges with managing your diabetes and foot care, and your recent health issues.  YOUR PLAN:  -TYPE 2 DIABETES MELLITUS WITH POOR GLYCEMIC CONTROL: Your blood sugar levels are not well controlled, with your A1c increasing to 7.6%. This is partly due to difficulties with medication access and diet. We have increased your Lantus  dose to 32 units daily and may increase it to 34 units if your blood sugar remains high. We also clarified that your Lantus  pen does not need to be refrigerated. A case manager will assist you with housing to help manage your diabetes better.  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 Your wound is healing slowly, and you are on antibiotics (doxycycline  and Augmentin ). We will arrange for a nurse to help with your wound dressing and a follow-up with Dr. Harden to prevent further infection.  -TACHYCARDIA: You have a fast heart rate, possibly because you are not taking your metoprolol  regularly. This medication helps to slow your heart rate. We discussed the importance of taking metoprolol  as prescribed, and we will ensure it is included in your refills.  -SEIZURE DISORDER, ON KEPPRA : You have a seizure disorder and are taking Keppra  to manage it. Continue taking Keppra  as prescribed.  INSTRUCTIONS:  Please follow up in one month to assess your blood glucose control and the status of your wound. Make sure to take all your medications as prescribed and reach out if you have any questions or concerns.  Which was recommend

## 2024-03-22 NOTE — Progress Notes (Signed)
 Subjective:  Patient ID: Gary Mitchell, male    DOB: 1971/06/16  Age: 53 y.o. MRN: [id number]  CC: Medical Management of Chronic Issues (S.O.B/Wound on right ankle/Discuss housing)     Discussed the use of AI scribe software for clinical note transcription with the patient, who gave verbal consent to proceed.  History of Present Illness Gary Mitchell is a 53 year old male with  a history of alcoholic cirrhosis, pancreatitis, solitary kidney, necrotic cyst, history of alcoholic hepatitis in 11/2019, alcohol-related seizures, type 2 diabetes mellitus, previously smoked (2 cig/day and quit 2022), paroxysmal A-fib, right ankle fracture status post ORIF 01/2024 with subsequent infected hardware followed by removal of hardware, right ankle osteomyelitis who presents for follow-up on his diabetes management and foot infection.  He experiences severe pain in his right foot, which he states is rolling to the side following multiple surgeries, including hardware insertion and removal. He continues antibiotics, doxycycline  and Augmentin , after discharge for an infection in the right ankle hardware. He declined a recommended amputation and has not followed up with orthopedics.  He has an upcoming appointment with infectious disease in 3 days.  Diabetes management is difficult due to homelessness and inconsistent medication access. His A1c increased from 7.1 in June to 9.6. Blood glucose levels are frequently in the 300s to 400s, with occasional readings in the 120s. He takes Jardiance , Lantus  (30 units), and Novolog  (10 units before meals) but is inconsistent with Lantus  due to storage misconceptions as he was of the opinion his pen needed to be stored in the fridge rater which she does not have. Dietary management is challenging due to living with relatives with dietary restrictions.  Currently homeless and staying with his cousin, he faces challenges in managing diabetes and foot care. Walking to get  food is difficult due to his foot condition.  He did have a hospitalization for hyperglycemia and seizure at the end of last month and has been on Keppra .   He is not taking metoprolol  regularly, believing it speeds him up, despite its purpose to slow heart rate.  He endorses adherence with his Eliquis .  Managing multiple medications is difficult while homeless.    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   Seizures (HCC)    Seizures (HCC)    Thrombocytopenia (HCC)     Past Surgical History:  Procedure Laterality Date   BIOPSY  08/02/2021   Procedure: BIOPSY;  Surgeon: Teressa Toribio SQUIBB, MD;  Location: [facility];  Service: Endoscopy;;   BIOPSY  05/07/2022   Procedure: BIOPSY;  Surgeon: Stacia Glendia BRAVO, MD;  Location: [facility];  Service: Gastroenterology;;   BONE BIOPSY  03/01/2024   Procedure: BIOPSY, GI;  Surgeon: Leigh Elspeth SQUIBB, MD;  Location: [facility];  Service: Gastroenterology;;   COLONOSCOPY WITH PROPOFOL   N/A 05/08/2022   Procedure: COLONOSCOPY WITH PROPOFOL ;  Surgeon: Stacia Glendia BRAVO, MD;  Location: [facility];  Service: Gastroenterology;  Laterality: N/A;   ENTEROSCOPY N/A 09/20/2020   Procedure: ENTEROSCOPY;  Surgeon: Eda Iha, MD;  Location: [facility];  Service: Gastroenterology;  Laterality: N/A;   ENTEROSCOPY N/A 05/11/2022   Procedure: ENTEROSCOPY;   Surgeon: Stacia Glendia BRAVO, MD;  Location: [facility];  Service: Gastroenterology;  Laterality: N/A;   ESOPHAGOGASTRODUODENOSCOPY N/A 03/01/2024   Procedure: EGD (ESOPHAGOGASTRODUODENOSCOPY);  Surgeon: Leigh Elspeth SQUIBB, MD;  Location: [facility];  Service: Gastroenterology;  Laterality: N/A;   ESOPHAGOGASTRODUODENOSCOPY (EGD) WITH PROPOFOL  N/A 08/02/2021   Procedure: ESOPHAGOGASTRODUODENOSCOPY (EGD) WITH PROPOFOL ;  Surgeon: Teressa Toribio SQUIBB, MD;  Location: [facility];  Service: Endoscopy;  Laterality: N/A;   ESOPHAGOGASTRODUODENOSCOPY (EGD) WITH PROPOFOL  N/A 01/31/2022   Procedure: ESOPHAGOGASTRODUODENOSCOPY (EGD) WITH PROPOFOL ;  Surgeon: Teressa Toribio SQUIBB, MD;  Location: [facility];  Service: Gastroenterology;  Laterality: N/A;   ESOPHAGOGASTRODUODENOSCOPY (EGD) WITH PROPOFOL  N/A 05/07/2022   Procedure: ESOPHAGOGASTRODUODENOSCOPY (EGD) WITH PROPOFOL ;  Surgeon: Stacia Glendia BRAVO, MD;  [facility]: [facility];  Service: Gastroenterology;  Laterality: N/A;   ESOPHAGOGASTRODUODENOSCOPY (EGD) WITH PROPOFOL  N/A 06/13/2022   Procedure: ESOPHAGOGASTRODUODENOSCOPY (EGD) WITH PROPOFOL ;  Surgeon: Wilhelmenia Aloha Raddle., MD;  Location: [facility];  Service: Gastroenterology;  Laterality: N/A;   EUS N/A 01/31/2022   Procedure: UPPER ENDOSCOPIC ULTRASOUND (EUS) RADIAL;  Surgeon: Teressa Toribio SQUIBB, MD;  Location: [facility];  Service: Gastroenterology;  Laterality: N/A;   EUS N/A 06/13/2022   Procedure: UPPER ENDOSCOPIC ULTRASOUND (EUS) LINEAR;  Surgeon: Wilhelmenia Aloha Raddle., MD;  Location: [facility];  Service: Gastroenterology;  Laterality: N/A;   FINE NEEDLE ASPIRATION N/A 08/02/2021   Procedure: FINE NEEDLE ASPIRATION (FNA) LINEAR;  Surgeon: Teressa Toribio SQUIBB, MD;  Location: [facility];  Service: Endoscopy;  Laterality: N/A;   FINE NEEDLE ASPIRATION N/A 01/31/2022   Procedure: FINE NEEDLE ASPIRATION (FNA) LINEAR;  Surgeon: Teressa Toribio SQUIBB, MD;  Location: [facility];  Service:  Gastroenterology;  Laterality: N/A;   FINE NEEDLE ASPIRATION N/A 06/13/2022   Procedure: FINE NEEDLE ASPIRATION (FNA) LINEAR;  Surgeon: Wilhelmenia Aloha Raddle., MD;  Location: [facility];  Service: Gastroenterology;  Laterality: N/A;   GIVENS CAPSULE STUDY N/A 05/09/2022   Procedure: GIVENS CAPSULE STUDY;  Surgeon: Stacia Glendia BRAVO, MD;  Location: [facility];  Service: Gastroenterology;  Laterality: N/A;   HARDWARE REMOVAL Right 02/04/2024   Procedure: REMOVAL, HARDWARE;  Surgeon: Harden Jerona GAILS, MD;  Location: [facility] OR;  Service: Orthopedics;  Laterality: Right;  HARDWARE REMOVAL RIGHT ANKLE   HEMOSTASIS CLIP PLACEMENT  05/08/2022   Procedure: HEMOSTASIS CLIP PLACEMENT;  Surgeon: Stacia Glendia BRAVO, MD;  Location: [facility];  Service: Gastroenterology;;   HEMOSTASIS CLIP PLACEMENT  05/11/2022   Procedure: HEMOSTASIS CLIP PLACEMENT;  Surgeon: Stacia Glendia BRAVO, MD;  Location: [facility];  Service: Gastroenterology;;   HEMOSTASIS CLIP PLACEMENT  03/01/2024   Procedure: CONTROL OF HEMORRHAGE, GI TRACT, ENDOSCOPIC, BY CLIPPING OR OVERSEWING;  Surgeon: Leigh Elspeth SQUIBB, MD;  Location: [facility];  Service: Gastroenterology;;   HOT HEMOSTASIS N/A 06/13/2022   Procedure: HOT HEMOSTASIS (ARGON PLASMA COAGULATION/BICAP);  Surgeon: Wilhelmenia Aloha Raddle., MD;  Location: [facility];  Service: Gastroenterology;  Laterality: N/A;   left kidney removed     ORIF ANKLE FRACTURE Right 01/06/2024   Procedure: OPEN REDUCTION INTERNAL FIXATION (ORIF) ANKLE FRACTURE;  Surgeon: Beverley Evalene BIRCH, MD;  Location: [facility];  Service: Orthopedics;  Laterality: Right;   POLYPECTOMY  05/08/2022   Procedure:  POLYPECTOMY;  Surgeon: Stacia Glendia BRAVO, MD;  Location: [facility];  Service: Gastroenterology;;   UPPER ESOPHAGEAL ENDOSCOPIC ULTRASOUND (EUS) N/A 08/02/2021   Procedure: UPPER ESOPHAGEAL ENDOSCOPIC ULTRASOUND (EUS);  Surgeon: Teressa Toribio SQUIBB, MD;  Location: [facility];  Service: Endoscopy;   Laterality: N/A;  Radial and Linear    Family History  Problem Relation Age of Onset   Diabetes Mellitus II Mother    Colon cancer Neg Hx    Esophageal cancer Neg Hx    Inflammatory bowel disease Neg Hx    Liver disease Neg Hx    Pancreatic cancer Neg Hx    Rectal cancer Neg Hx    Stomach cancer Neg Hx     Social History   Socioeconomic History   Marital status: Single    Spouse name: Not on file   Number of children: Not on file   Years of education: Not on file   Highest education level: Not on file  Occupational History   Not on file  Tobacco Use   Smoking status: Former    Current packs/day: 0.00    Average packs/day: 0.1 packs/day for 25.0 years (2.5 ttl pk-yrs)    Types: Cigarettes    Start date: 33    Quit date: 2019    Years since quitting: 6.7    Passive exposure: Past   Smokeless tobacco: Never   Tobacco comments:    Former smoker 08/08/22  Vaping Use   Vaping status: Never Used  Substance and Sexual Activity   Alcohol use: Not Currently   Drug use: Not Currently   Sexual activity: Not Currently  Other Topics Concern   Not on file  Social History Narrative   Not on file   Social Drivers of Health   Financial Resource Strain: High Risk (04/11/2023)   Overall Financial Resource Strain (CARDIA)    Difficulty of Paying Living Expenses: Hard  Food Insecurity: Food Insecurity Present (02/29/2024)   Hunger Vital Sign    Worried About Running Out of Food in the Last Year: Often true    Ran Out of Food in the Last Year: Often true  Transportation Needs: Unmet Transportation Needs (02/29/2024)   PRAPARE - Transportation    Lack of Transportation (Medical): Yes    Lack of Transportation (Non-Medical): Yes  Physical Activity: Patient Declined (05/13/2023)   Exercise Vital Sign    Days of Exercise per Week: Patient declined    Minutes of Exercise per Session: Patient declined  Stress: No Stress Concern Present (05/13/2023)   [employer] of  Occupational Health - Occupational Stress Questionnaire    Feeling of Stress : Not at all  Social Connections: Socially Isolated (05/13/2023)   Social Connection and Isolation Panel    Frequency of Communication with Friends and Family: Once a week    Frequency of Social Gatherings with Friends and Family: Once a week    Attends Religious Services: Never    Database administrator or Organizations: No    Attends Engineer, structural: Never    Marital Status: Never married    Allergies  Allergen Reactions   Iohexol  Other (See Comments)    Unknown reaction at 90 days old Mom at bedside reported patient was given injections of iohexol  in foot and resulted in hole in foot or foot got infected   Nsaids Other (See Comments)    Patient said he was told he could take Tylenol  ONLY.    Outpatient Medications Prior to Visit  Medication  Sig Dispense Refill   acetaminophen  (TYLENOL ) 500 MG tablet Take 1,500 mg by mouth daily as needed for moderate pain (pain score 4-6), fever or headache.     amoxicillin -clavulanate (AUGMENTIN ) 875-125 MG tablet Take 1 tablet by mouth every 12 (twelve) hours. 80 tablet 0   apixaban  (ELIQUIS ) 2.5 MG TABS tablet Take 1 tablet (2.5 mg total) by mouth 2 (two) times daily for 28 days. 56 tablet 0   atorvastatin  (LIPITOR) 20 MG tablet Take 1 tablet (20 mg total) by mouth daily. 90 tablet 3   Continuous Glucose Sensor (FREESTYLE LIBRE 3 PLUS SENSOR) MISC Change sensor every 15 days. Use to check blood sugar continuously. 2 each 6   doxycycline  (VIBRA -TABS) 100 MG tablet Take 1 tablet (100 mg total) by mouth every 12 (twelve) hours. 80 tablet 0   empagliflozin  (JARDIANCE ) 10 MG TABS tablet Take 1 tablet (10 mg total) by mouth daily before breakfast. 90 tablet 1   folic acid  (FOLVITE ) 1 MG tablet Take 1 tablet (1 mg total) by mouth daily. 90 tablet 0   ibuprofen  (ADVIL ) 200 MG tablet Take 400 mg by mouth every 6 (six) hours as needed for mild pain (pain score 1-3)  or moderate pain (pain score 4-6).     insulin  lispro (HUMALOG  KWIKPEN) 100 UNIT/ML KwikPen Inject 10 Units into the skin 3 (three) times daily. 30 mL 3   lactulose  (CHRONULAC ) 10 GM/15ML solution Take 45 mLs (30 g total) by mouth as needed for mild constipation. 236 mL 3   levETIRAcetam  (KEPPRA ) 1000 MG tablet Take 1 tablet (1,000 mg total) by mouth 2 (two) times daily. 60 tablet 0   Menthol-Methyl Salicylate (MUSCLE RUB) 10-15 % CREA Apply 1 Application topically as needed for muscle pain (arm pain).     metFORMIN  (GLUCOPHAGE ) 500 MG tablet Take 2 tablets (1,000 mg total) by mouth 2 (two) times daily with a meal. 360 tablet 1   metoprolol  succinate (TOPROL -XL) 25 MG 24 hr tablet Take 0.5 tablets (12.5 mg total) by mouth daily. 45 tablet 1   Multiple Vitamin (MULTIVITAMIN WITH MINERALS) TABS tablet Take 1 tablet by mouth daily. 30 tablet 0   pantoprazole  (PROTONIX ) 40 MG tablet Take 1 tablet (40 mg total) by mouth 2 (two) times daily before a meal for 60 days, THEN 1 tablet (40 mg total) daily before breakfast. 150 tablet 0   thiamine  (VITAMIN B1) 100 MG tablet Take 1 tablet (100 mg total) by mouth daily. 90 tablet 0   insulin  glargine (LANTUS  SOLOSTAR) 100 UNIT/ML Solostar Pen Inject 30 Units into the skin daily. 30 mL 1   No facility-administered medications prior to visit.     ROS Review of Systems  Constitutional:  Negative for activity change and appetite change.  HENT:  Negative for sinus pressure and sore throat.   Respiratory:  Positive for shortness of breath. Negative for chest tightness and wheezing.   Cardiovascular:  Negative for chest pain and palpitations.  Gastrointestinal:  Negative for abdominal distention, abdominal pain and constipation.  Genitourinary: Negative.   Musculoskeletal: Negative.        See HPI  Skin:  Positive for wound.  Psychiatric/Behavioral:  Negative for behavioral problems and dysphoric mood.     Objective:  BP 120/74   Pulse (!) 110   Ht 5' 6  (1.676 m)   Wt 133 lb 3.2 oz (60.4 kg)   SpO2 99%   BMI 21.50 kg/m      03/22/2024    3:19 PM  03/04/2024    7:46 AM 03/04/2024    3:26 AM  BP/Weight  Systolic BP 120 145 134  Diastolic BP 74 96 84  Wt. (Lbs) 133.2    BMI 21.5 kg/m2        Physical Exam Constitutional:      Appearance: He is well-developed.  Cardiovascular:     Rate and Rhythm: Normal rate.     Heart sounds: Normal heart sounds. No murmur heard. Pulmonary:     Effort: Pulmonary effort is normal.     Breath sounds: Normal breath sounds. No wheezing or rales.  Chest:     Chest wall: No tenderness.  Abdominal:     General: Bowel sounds are normal. There is no distension.     Palpations: Abdomen is soft. There is no mass.     Tenderness: There is no abdominal tenderness.  Musculoskeletal:        General: Normal range of motion.     Right lower leg: Edema present.     Left lower leg: No edema.  Skin:    Comments: Open wound in right lateral malleolus with sutures in place with slight bleeding Medial malleolus of right ankle with erythematous superficial ulcer  Neurological:     Mental Status: He is alert and oriented to person, place, and time.  Psychiatric:        Mood and Affect: Mood normal.        Latest Ref Rng & Units 03/04/2024    3:04 AM 03/03/2024    4:20 AM 03/02/2024    4:54 AM  CMP  Glucose 70 - 99 mg/dL 709  765  725   BUN 6 - 20 mg/dL 7  6  <5   Creatinine 9.38 - 1.24 mg/dL 9.42  9.47  9.48   Sodium 135 - 145 mmol/L 131  131  130   Potassium 3.5 - 5.1 mmol/L 4.0  3.8  4.3   Chloride 98 - 111 mmol/L 102  99  100   CO2 22 - 32 mmol/L 20  22  24    Calcium  8.9 - 10.3 mg/dL 8.2  8.3  8.3   Total Protein 6.5 - 8.1 g/dL  7.3  7.4   Total Bilirubin 0.0 - 1.2 mg/dL  2.0  2.3   Alkaline Phos 38 - 126 U/L  228  237   AST 15 - 41 U/L  36  47   ALT 0 - 44 U/L  18  20     Lipid Panel     Component Value Date/Time   TRIG 120 07/09/2020 1240    CBC    Component Value Date/Time   WBC  6.2 03/04/2024 0304   RBC 3.25 (L) 03/04/2024 0304   HGB 7.8 (L) 03/04/2024 0304   HGB 10.7 (L) 10/16/2022 1226   HCT 24.8 (L) 03/04/2024 0304   HCT 31.3 (L) 10/16/2022 1226   PLT 123 (L) 03/04/2024 0304   PLT 87 (LL) 10/16/2022 1226   MCV 76.3 (L) 03/04/2024 0304   MCV 87 10/16/2022 1226   MCH 24.0 (L) 03/04/2024 0304   MCHC 31.5 03/04/2024 0304   RDW 25.8 (H) 03/04/2024 0304   RDW 19.8 (H) 10/16/2022 1226   LYMPHSABS 1.1 03/03/2024 0420   LYMPHSABS 1.3 10/16/2022 1226   MONOABS 0.3 03/03/2024 0420   EOSABS 0.3 03/03/2024 0420   EOSABS 0.0 10/16/2022 1226   BASOSABS 0.0 03/03/2024 0420   BASOSABS 0.1 10/16/2022 1226    Lab Results  Component Value  Date   HGBA1C 9.6 (A) 03/22/2024    Lab Results  Component Value Date   HGBA1C 9.6 (A) 03/22/2024   HGBA1C 7.1 (H) 12/30/2023   HGBA1C 8.6 (A) 10/14/2023        Assessment & Plan Type 2 diabetes mellitus with poor glycemic control Poor glycemic control with A1c increase to 9.6%. Contributing factors: homelessness, inconsistent insulin  use, dietary challenges, recent infection. - Increase Lantus  to 32 units daily, consider 34 units if hyperglycemia persists. - Educated on Humalog  use with meals only to prevent hypoglycemia. - Clarified Lantus  pen storage does not require refrigeration. - Arrange case manager assistance for housing.  Chronic right ankle osteomyelitis with retained hardware and postoperative wound with delayed healing Chronic osteomyelitis with retained hardware. Delayed wound healing and bleeding. Declined amputation by orthopedic. On doxycycline  and Augmentin . Concerns about systemic infection due to diabetes and open wound.  -Dressing change provided in clinic - Arrange follow-up with Dr. Harden as he needs his future sticking out and evaluation of the wound - Continue doxycycline  and Augmentin . -Keep appointment with infectious disease - Arrange nurse for wound dressing. - Educated on follow-up  importance to prevent systemic infection as he is at high risk due to hyperglycemia  Paroxysmal A-fib Currently in sinus tachycardia Tachycardia possibly due to non-compliance with metoprolol . Misunderstanding of medication purpose. - Educated on metoprolol  importance for heart rate management. - Ensure metoprolol  included in refills. - Continue anticoagulation with Eliquis   Alcohol-induced seizures/alcoholic cirrhosis Seizure disorder managed with Keppra . - Continue Keppra  as prescribed. - Continue to follow-up with GI   Homelessness - Case manager called in to see patient to assist with resources    Follow-Up Follow-up needed for diabetes management and wound healing. - Schedule follow-up in one month to assess blood glucose control and wound status.      Meds ordered this encounter  Medications   insulin  glargine (LANTUS  SOLOSTAR) 100 UNIT/ML Solostar Pen    Sig: Inject 32 Units into the skin daily.    Dispense:  30 mL    Refill:  3    Dose increase    Follow-up: Return in about 1 month (around 04/21/2024) for Diabetes follow-up with PCP.       Corrina Sabin, MD, FAAFP. [facility] [place], [place] [phone]   03/22/2024, 5:56 PM

## 2024-03-23 DIAGNOSIS — R531 Weakness: Secondary | ICD-10-CM | POA: Diagnosis not present

## 2024-03-25 ENCOUNTER — Other Ambulatory Visit: Payer: Self-pay

## 2024-03-25 ENCOUNTER — Ambulatory Visit (INDEPENDENT_AMBULATORY_CARE_PROVIDER_SITE_OTHER): Payer: Self-pay | Admitting: Internal Medicine

## 2024-03-25 ENCOUNTER — Encounter: Payer: Self-pay | Admitting: Internal Medicine

## 2024-03-25 VITALS — BP 139/80 | HR 93 | Temp 99.3°F

## 2024-03-25 DIAGNOSIS — M869 Osteomyelitis, unspecified: Secondary | ICD-10-CM

## 2024-03-25 DIAGNOSIS — T847XXD Infection and inflammatory reaction due to other internal orthopedic prosthetic devices, implants and grafts, subsequent encounter: Secondary | ICD-10-CM

## 2024-03-25 DIAGNOSIS — M86171 Other acute osteomyelitis, right ankle and foot: Secondary | ICD-10-CM | POA: Diagnosis not present

## 2024-03-25 DIAGNOSIS — M1712 Unilateral primary osteoarthritis, left knee: Secondary | ICD-10-CM | POA: Diagnosis not present

## 2024-03-25 NOTE — Patient Instructions (Addendum)
 I have placed referral to dr duda's office again for reevaluation and see if wound care would help. I suspect it won't get better without surgery   His office phone is: [phone]    Continue the current doxycycline /augmentin  until 04/10/24    Labs today    See me end of October or beginning of November for follow up

## 2024-03-25 NOTE — Progress Notes (Signed)
 [facility]  Patient Active Problem List   Diagnosis Date Noted   Subacute osteomyelitis of right ankle (HCC) 03/01/2024   Acute gastric ulcer with hemorrhage 03/01/2024   Hyperglycemia 03/01/2024   AMS (altered mental status) 02/28/2024   Infected hardware in right leg (HCC) 02/04/2024   Infected hardware in right lower extremity (HCC) 02/04/2024   Closed right ankle fracture 01/05/2024   Depression 11/17/2023   Pancreatic cyst 04/30/2023   Microcytic anemia 04/30/2023   Alcohol use disorder 04/30/2023   Acute metabolic encephalopathy 03/31/2023   Encounter for medication review and counseling 11/13/2022   Paroxysmal atrial fibrillation (HCC) 08/08/2022   Hypercoagulable state due to paroxysmal atrial fibrillation (HCC) 08/08/2022   Malnutrition of moderate degree (HCC) 05/08/2022   Benign neoplasm of descending colon    Symptomatic anemia 05/05/2022   Hyperlipidemia 05/05/2022   Hyperammonemia (HCC) 05/03/2022   Type 2 diabetes mellitus (HCC) 05/29/2021   Portal hypertensive gastropathy (HCC)    Esophageal varices without bleeding (HCC)    Seizure disorder (HCC) 09/17/2020   Alcoholic cirrhosis of liver without ascites (HCC) 09/17/2020   Acute hepatic encephalopathy (HCC) 09/17/2020   Hypomagnesemia 09/17/2020   Suspected CVA (cerebral vascular accident) (HCC) 09/17/2020   Hypokalemia 09/17/2020   Seizure (HCC) 07/09/2020   High bilirubin 07/09/2020   Anemia 07/09/2020   Thrombocytopenia (HCC) 07/09/2020   COVID-19 virus infection 07/09/2020   GERD (gastroesophageal reflux disease) 07/09/2020   Pancreatic mass 11/29/2019   Alcoholic hepatitis 11/23/2019   Gallstones 11/23/2019   Alcohol abuse 11/23/2019      Subjective:    Patient ID: Gary Mitchell, male    DOB: [birth year], 53 y.o.   MRN: [id number]  Chief Complaint  Patient presents with   Follow-up    HPI:  Gary Mitchell is a 53 y.o. male with cirrhosis, etoh use,  right ankle hardware, here for hospital f/u of hardware associated osteomyelitis of the ankle  He was high risk for failure as already failed abx therapy after partial hardware removal 02/04/24, but refused amputation   He is here for f/u on augmentin /doxy for planned 6 weeks from 8/23   Allergies  Allergen Reactions   Iohexol  Other (See Comments)    Unknown reaction at 78 days old Mom at bedside reported patient was given injections of iohexol  in foot and resulted in hole in foot or foot got infected   Nsaids Other (See Comments)    Patient said he was told he could take Tylenol  ONLY.      Outpatient Medications Prior to Visit  Medication Sig Dispense Refill   amoxicillin -clavulanate (AUGMENTIN ) 875-125 MG tablet Take 1 tablet by mouth every 12 (twelve) hours. 80 tablet 0   doxycycline  (VIBRA -TABS) 100 MG tablet Take 1 tablet (100 mg total) by mouth every 12 (twelve) hours. 80 tablet 0   acetaminophen  (TYLENOL ) 500 MG tablet Take 1,500 mg by mouth daily as needed for moderate pain (pain score 4-6), fever or headache.     apixaban  (ELIQUIS ) 2.5 MG TABS tablet Take 1 tablet (2.5 mg total) by mouth 2 (two) times daily for 28 days. 56 tablet 0   atorvastatin  (LIPITOR) 20 MG tablet Take 1 tablet (20 mg total) by mouth daily. 90 tablet 3   Continuous Glucose Sensor (FREESTYLE LIBRE 3 PLUS SENSOR) MISC Change sensor every 15 days. Use to check blood sugar continuously. 2 each 6   empagliflozin  (JARDIANCE ) 10 MG TABS tablet Take 1 tablet (  10 mg total) by mouth daily before breakfast. 90 tablet 1   folic acid  (FOLVITE ) 1 MG tablet Take 1 tablet (1 mg total) by mouth daily. 90 tablet 0   ibuprofen  (ADVIL ) 200 MG tablet Take 400 mg by mouth every 6 (six) hours as needed for mild pain (pain score 1-3) or moderate pain (pain score 4-6).     insulin  glargine (LANTUS  SOLOSTAR) 100 UNIT/ML Solostar Pen Inject 32 Units into the skin daily. 30 mL 3   insulin  lispro (HUMALOG  KWIKPEN) 100 UNIT/ML  KwikPen Inject 10 Units into the skin 3 (three) times daily. 30 mL 3   lactulose  (CHRONULAC ) 10 GM/15ML solution Take 45 mLs (30 g total) by mouth as needed for mild constipation. 236 mL 3   levETIRAcetam  (KEPPRA ) 1000 MG tablet Take 1 tablet (1,000 mg total) by mouth 2 (two) times daily. 60 tablet 0   Menthol-Methyl Salicylate (MUSCLE RUB) 10-15 % CREA Apply 1 Application topically as needed for muscle pain (arm pain).     metFORMIN  (GLUCOPHAGE ) 500 MG tablet Take 2 tablets (1,000 mg total) by mouth 2 (two) times daily with a meal. 360 tablet 1   metoprolol  succinate (TOPROL -XL) 25 MG 24 hr tablet Take 0.5 tablets (12.5 mg total) by mouth daily. 45 tablet 1   Multiple Vitamin (MULTIVITAMIN WITH MINERALS) TABS tablet Take 1 tablet by mouth daily. 30 tablet 0   pantoprazole  (PROTONIX ) 40 MG tablet Take 1 tablet (40 mg total) by mouth 2 (two) times daily before a meal for 60 days, THEN 1 tablet (40 mg total) daily before breakfast. 150 tablet 0   thiamine  (VITAMIN B1) 100 MG tablet Take 1 tablet (100 mg total) by mouth daily. 90 tablet 0   No facility-administered medications prior to visit.     Social History   Socioeconomic History   Marital status: Single    Spouse name: Not on file   Number of children: Not on file   Years of education: Not on file   Highest education level: Not on file  Occupational History   Not on file  Tobacco Use   Smoking status: Former    Current packs/day: 0.00    Average packs/day: 0.1 packs/day for 25.0 years (2.5 ttl pk-yrs)    Types: Cigarettes    Start date: 24    Quit date: 2019    Years since quitting: 6.7    Passive exposure: Past   Smokeless tobacco: Never   Tobacco comments:    Former smoker 08/08/22  Vaping Use   Vaping status: Never Used  Substance and Sexual Activity   Alcohol use: Not Currently   Drug use: Not Currently   Sexual activity: Not Currently  Other Topics Concern   Not on file  Social History Narrative   Not on file    Social Drivers of Health   Financial Resource Strain: High Risk (04/11/2023)   Overall Financial Resource Strain (CARDIA)    Difficulty of Paying Living Expenses: Hard  Food Insecurity: Food Insecurity Present (02/29/2024)   Hunger Vital Sign    Worried About Running Out of Food in the Last Year: Often true    Ran Out of Food in the Last Year: Often true  Transportation Needs: Unmet Transportation Needs (02/29/2024)   PRAPARE - Transportation    Lack of Transportation (Medical): Yes    Lack of Transportation (Non-Medical): Yes  Physical Activity: Patient Declined (05/13/2023)   Exercise Vital Sign    Days of Exercise per Week: Patient declined  Minutes of Exercise per Session: Patient declined  Stress: No Stress Concern Present (05/13/2023)   [employer] of Occupational Health - Occupational Stress Questionnaire    Feeling of Stress : Not at all  Social Connections: Socially Isolated (05/13/2023)   Social Connection and Isolation Panel    Frequency of Communication with Friends and Family: Once a week    Frequency of Social Gatherings with Friends and Family: Once a week    Attends Religious Services: Never    Database administrator or Organizations: No    Attends Banker Meetings: Never    Marital Status: Never married  Intimate Partner Violence: Patient Declined (02/29/2024)   Humiliation, Afraid, Rape, and Kick questionnaire    Fear of Current or Ex-Partner: Patient declined    Emotionally Abused: Patient declined    Physically Abused: Patient declined    Sexually Abused: Patient declined      Review of Systems     Objective:    BP 139/80   Pulse 93   Temp 99.3 F (37.4 C) (Temporal)   SpO2 98%  Nursing note and vital signs reviewed.  Physical Exam     General/constitutional: no distress, pleasant HEENT: Normocephalic, PER, Conj Clear, EOMI, Oropharynx clear Neck supple CV: rrr no mrg Lungs: clear to auscultation, normal respiratory  effort Abd: Soft, Nontender Ext: no edema Skin: No Rash Neuro: nonfocal MSK: rle swelling, mild tenderness; warm to touch no fluctuance; lateral incision dehiscence and chronic wound medial malleolus. Dressing shown purulent discharge         Labs: Lab Results  Component Value Date   WBC 6.2 03/04/2024   HGB 7.8 (L) 03/04/2024   HCT 24.8 (L) 03/04/2024   MCV 76.3 (L) 03/04/2024   PLT 123 (L) 03/04/2024   Last metabolic panel Lab Results  Component Value Date   GLUCOSE 290 (H) 03/04/2024   NA 131 (L) 03/04/2024   K 4.0 03/04/2024   CL 102 03/04/2024   CO2 20 (L) 03/04/2024   BUN 7 03/04/2024   CREATININE 0.57 (L) 03/04/2024   GFRNONAA >60 03/04/2024   CALCIUM  8.2 (L) 03/04/2024   PHOS 2.8 03/04/2024   PROT 7.3 03/03/2024   ALBUMIN 2.2 (L) 03/03/2024   LABGLOB 3.7 08/19/2023   AGRATIO 1.2 08/05/2022   BILITOT 2.0 (H) 03/03/2024   ALKPHOS 228 (H) 03/03/2024   AST 36 03/03/2024   ALT 18 03/03/2024   ANIONGAP 9 03/04/2024   Lab Results  Component Value Date   CRP 1.4 (H) 07/09/2020   No results found for: ESRSEDRATE, POCTSEDRATE  Micro:  Serology:  Imaging:  Assessment & Plan:   Problem List Items Addressed This Visit   None Visit Diagnoses       Hardware complicating wound infection, subsequent encounter    -  Primary     Osteomyelitis, unspecified site, unspecified type (HCC)       Relevant Orders   CBC w/Diff   COMPLETE METABOLIC PANEL WITHOUT GFR   C-reactive protein   Sedimentation rate   Ambulatory referral to Orthopedic Surgery         No orders of the defined types were placed in this encounter.    Abx: 8/23 vanc/amp-sulb --> doxy/augmentin    Outpatient doxy/augmentin   Assessment: 53 yo male etoh abuse, liver cirrhosis, right ankle hardware complicated by om/infection failing oral abx readmitted 8/23, now here for hospital f/u of same   He was admitted 7/30 for initial  infection visit at [facility] underwent lateral screws removal and sent out on doxy/augmentin  e gallinarum (s amp; r vanc), and mrsa (S doxy). Mixed organisms not worked up   He returns with now medial side skin break down and ct showing significantly worsening imaging   He had refused amputation which Dr Harden had advised. From what ct shows I agree I am not sure this is salvageable   Sent out on 6 weeks doxy/augmentin  8/28 with discussion that I wasn't optimistic this will work   ----------- 03/25/24 id clinic assessment 6 weeks plan abx would end 04/10/24 He has intermittent shooting pain on the right leg/ankle He has purulent/bloody discharge and swelling The medial/lateral incision remains open. There is some suture on the lateral side that needs to be removed  No fever, chill  He hasn't followed up with ortho and the way things look I told him abx likely not doing anything and could be dangerous for him   He can finish abx course and will need at least wound care if not amputation  F/u with me 2-4 weeks after he finish abx    Labs today    Follow-up: Return in about 6 weeks (around 05/06/2024).      Constance ONEIDA Passer, MD [facility] 03/25/2024, 3:07 PM

## 2024-03-26 ENCOUNTER — Ambulatory Visit (INDEPENDENT_AMBULATORY_CARE_PROVIDER_SITE_OTHER): Admitting: Family

## 2024-03-26 ENCOUNTER — Encounter: Payer: Self-pay | Admitting: Family

## 2024-03-26 DIAGNOSIS — T847XXA Infection and inflammatory reaction due to other internal orthopedic prosthetic devices, implants and grafts, initial encounter: Secondary | ICD-10-CM

## 2024-03-26 LAB — COMPLETE METABOLIC PANEL WITHOUT GFR
AG Ratio: 0.5 (calc) — ABNORMAL LOW (ref 1.0–2.5)
ALT: 16 U/L (ref 9–46)
AST: 45 U/L — ABNORMAL HIGH (ref 10–35)
Albumin: 2.9 g/dL — ABNORMAL LOW (ref 3.6–5.1)
Alkaline phosphatase (APISO): 380 U/L — ABNORMAL HIGH (ref 35–144)
BUN/Creatinine Ratio: 14 (calc) (ref 6–22)
BUN: 6 mg/dL — ABNORMAL LOW (ref 7–25)
CO2: 24 mmol/L (ref 20–32)
Calcium: 8.4 mg/dL — ABNORMAL LOW (ref 8.6–10.3)
Chloride: 98 mmol/L (ref 98–110)
Creat: 0.43 mg/dL — ABNORMAL LOW (ref 0.70–1.30)
Globulin: 5.4 g/dL — ABNORMAL HIGH (ref 1.9–3.7)
Glucose, Bld: 212 mg/dL — ABNORMAL HIGH (ref 65–99)
Potassium: 3.7 mmol/L (ref 3.5–5.3)
Sodium: 132 mmol/L — ABNORMAL LOW (ref 135–146)
Total Bilirubin: 2.1 mg/dL — ABNORMAL HIGH (ref 0.2–1.2)
Total Protein: 8.3 g/dL — ABNORMAL HIGH (ref 6.1–8.1)

## 2024-03-26 LAB — CBC WITH DIFFERENTIAL/PLATELET
Absolute Lymphocytes: 1625 {cells}/uL (ref 850–3900)
Absolute Monocytes: 826 {cells}/uL (ref 200–950)
Basophils Absolute: 52 {cells}/uL (ref 0–200)
Basophils Relative: 0.8 %
Eosinophils Absolute: 130 {cells}/uL (ref 15–500)
Eosinophils Relative: 2 %
HCT: 28.6 % — ABNORMAL LOW (ref 38.5–50.0)
Hemoglobin: 8.6 g/dL — ABNORMAL LOW (ref 13.2–17.1)
MCH: 25.2 pg — ABNORMAL LOW (ref 27.0–33.0)
MCHC: 30.1 g/dL — ABNORMAL LOW (ref 32.0–36.0)
MCV: 83.9 fL (ref 80.0–100.0)
MPV: 10.8 fL (ref 7.5–12.5)
Monocytes Relative: 12.7 %
Neutro Abs: 3868 {cells}/uL (ref 1500–7800)
Neutrophils Relative %: 59.5 %
Platelets: 178 Thousand/uL (ref 140–400)
RBC: 3.41 Million/uL — ABNORMAL LOW (ref 4.20–5.80)
RDW: 20 % — ABNORMAL HIGH (ref 11.0–15.0)
Total Lymphocyte: 25 %
WBC: 6.5 Thousand/uL (ref 3.8–10.8)

## 2024-03-26 LAB — C-REACTIVE PROTEIN: CRP: 7.1 mg/L (ref <8.0)

## 2024-03-26 LAB — SEDIMENTATION RATE: Sed Rate: 119 mm/h — ABNORMAL HIGH (ref 0–20)

## 2024-03-26 NOTE — Progress Notes (Signed)
 Office Visit Note   Patient: Gary Mitchell           Date of Birth: 01-10-1971           MRN: [id number] Visit Date: 03/26/2024              Requested by: Delbert Clam, MD [address] [place],  [place] [address] PCP: Delbert Clam, MD  Chief Complaint  Patient presents with   Right Ankle - Pain      HPI: Patient is a 53 year old gentleman with a history of cirrhosis, EtOH use, right ankle hardware and hardware associated osteomyelitis of the ankle has had hardware removal.  He has already failed antibiotic therapy after partial hardware removal on July 30.  At that time Dr. Harden recommended below-knee amputation on the right.  Patient declined  Has been following with ID he is currently on a course of Augmentin  and doxycycline  plan for 6 weeks to and October 4  Today's concern for malalignment of the ankle difficulty bearing weight he is able to bear weight he reports most of the time but sometimes things feel unstable and painful  He has not been able to change the dressing daily he does still have sutures laterally.  Reports when he does change the dressing he has been cleansing with alcohol and peroxide  Assessment & Plan: Visit Diagnoses: No diagnosis found.  Plan: Again discussed below-knee amputation on the right his current osteomyelitis of the ankle.  Patient is not willing to proceed with amputation at this time.  Will plan to follow-up in 3 weeks a few days after his completion of Augmentin  and doxycycline   Given Vashe today he will begin daily Vashe cleansing of the ulcers, as well as Vashe to dry dressings  Follow-Up Instructions: Return in about 17 days (around 04/12/2024), or c duda.   Ortho Exam  Patient is alert, oriented, no adenopathy, well-dressed, normal affect, normal respiratory effort. On examination right ankle there is trace edema present no erythema no cellulitis laterally sutures are in place the incision has dehisced there is an  ulcer present which is about 3 cm in diameter 2 mm deep there is no exposed bone today granulation present in the wound bed immediately there is a 3 cm ulcer which is superficial with flat pink tissue reports this was the result of rubbing in his cam boot  Palpable strong dorsalis pedis pulse  Imaging: No results found. No images are attached to the encounter.  Radiographs of the right ankle from August 23: IMPRESSION: 1. Previous medial and lateral malleolus fractures with residual fracture lines may indicate nonunion. 2. Interval removal of lateral plate and screw. 3. Interval lateral displacement of the talus and the distal lateral malleolar fragment with respect to the tibia. 4. Soft tissue ulceration and soft tissue gas. Underlying periosteal reaction in bone loss in the lateral malleolus could indicate osteomyelitis or bone healing.  Labs: Lab Results  Component Value Date   HGBA1C 9.6 (A) 03/22/2024   HGBA1C 7.1 (H) 12/30/2023   HGBA1C 8.6 (A) 10/14/2023   ESRSEDRATE 119 (H) 03/25/2024   CRP 7.1 03/25/2024   CRP 1.4 (H) 07/09/2020   REPTSTATUS 03/05/2024 FINAL 02/29/2024   GRAMSTAIN NO WBC SEEN NO ORGANISMS SEEN  02/04/2024   CULT  02/29/2024    NO GROWTH 5 DAYS Performed at [facility], [address], [place], [place] [address]    LABORGA METHICILLIN RESISTANT STAPHYLOCOCCUS AUREUS 02/04/2024   LABORGA  ENTEROCOCCUS GALLINARUM 02/04/2024     Lab Results  Component Value Date   ALBUMIN 2.2 (L) 03/03/2024   ALBUMIN 2.1 (L) 03/02/2024   ALBUMIN 2.0 (L) 03/01/2024   PREALBUMIN 14 (L) 05/06/2022    Lab Results  Component Value Date   MG 1.5 (L) 03/04/2024   MG 1.4 (L) 03/03/2024   MG 1.4 (L) 03/03/2024   No results found for: VD25OH  Lab Results  Component Value Date   PREALBUMIN 14 (L) 05/06/2022      Latest Ref Rng & Units 03/25/2024    3:35 AM 03/04/2024    3:04 AM 03/03/2024    9:01 AM  CBC EXTENDED  WBC 3.8 - 10.8 Thousand/uL 6.5  6.2   7.6   RBC 4.20 - 5.80 Million/uL 3.41  3.25  3.78   Hemoglobin 13.2 - 17.1 g/dL 8.6  7.8  9.1   HCT 61.4 - 50.0 % 28.6  24.8  28.5   Platelets 140 - 400 Thousand/uL 178  123  193   NEUT# 1,500 - 7,800 cells/uL 3,868        There is no height or weight on file to calculate BMI.  Orders:  No orders of the defined types were placed in this encounter.  No orders of the defined types were placed in this encounter.    Procedures: No procedures performed  Clinical Data: No additional findings.  ROS:  All other systems negative, except as noted in the HPI. Review of Systems  Objective: Vital Signs: There were no vitals taken for this visit.  Specialty Comments:  No specialty comments available.  PMFS History: Patient Active Problem List   Diagnosis Date Noted   Subacute osteomyelitis of right ankle (HCC) 03/01/2024   Acute gastric ulcer with hemorrhage 03/01/2024   Hyperglycemia 03/01/2024   AMS (altered mental status) 02/28/2024   Infected hardware in right leg (HCC) 02/04/2024   Infected hardware in right lower extremity (HCC) 02/04/2024   Closed right ankle fracture 01/05/2024   Depression 11/17/2023   Pancreatic cyst 04/30/2023   Microcytic anemia 04/30/2023   Alcohol use disorder 04/30/2023   Acute metabolic encephalopathy 03/31/2023   Encounter for medication review and counseling 11/13/2022   Paroxysmal atrial fibrillation (HCC) 08/08/2022   Hypercoagulable state due to paroxysmal atrial fibrillation (HCC) 08/08/2022   Malnutrition of moderate degree (HCC) 05/08/2022   Benign neoplasm of descending colon    Symptomatic anemia 05/05/2022   Hyperlipidemia 05/05/2022   Hyperammonemia (HCC) 05/03/2022   Type 2 diabetes mellitus (HCC) 05/29/2021   Portal hypertensive gastropathy (HCC)    Esophageal varices without bleeding (HCC)    Seizure disorder (HCC) 09/17/2020   Alcoholic cirrhosis of liver without ascites (HCC) 09/17/2020   Acute hepatic encephalopathy  (HCC) 09/17/2020   Hypomagnesemia 09/17/2020   Suspected CVA (cerebral vascular accident) (HCC) 09/17/2020   Hypokalemia 09/17/2020   Seizure (HCC) 07/09/2020   High bilirubin 07/09/2020   Anemia 07/09/2020   Thrombocytopenia (HCC) 07/09/2020   COVID-19 virus infection 07/09/2020   GERD (gastroesophageal reflux disease) 07/09/2020   Pancreatic mass 11/29/2019   Alcoholic hepatitis 11/23/2019   Gallstones 11/23/2019   Alcohol abuse 11/23/2019   Past Medical History:  Diagnosis Date   Alcoholic hepatitis 11/2019   Anemia    Anxiety    Atrial fibrillation (HCC)    Cirrhosis with alcoholism (HCC) 11/2019   Coagulopathy (HCC)    Attributed to liver disease/cirrhosis   Depression    Diabetes mellitus without complication (HCC)    type  2   Dyspnea    Neuromuscular disorder (HCC)    neuropathy  feet legs   Pancreatic lesion 11/2019   Initially concerning for neoplasm but improved appearance on MRI 12/2019 at which time pseudocyst was most likely diagnosis.   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   Seizures (HCC)    Seizures (HCC)    Thrombocytopenia (HCC)     Family History  Problem Relation Age of Onset   Diabetes Mellitus II Mother    Colon cancer Neg Hx    Esophageal cancer Neg Hx    Inflammatory bowel disease Neg Hx    Liver disease Neg Hx    Pancreatic cancer Neg Hx    Rectal cancer Neg Hx    Stomach cancer Neg Hx     Past Surgical History:  Procedure Laterality Date   BIOPSY  08/02/2021   Procedure: BIOPSY;  Surgeon: Teressa Toribio SQUIBB, MD;  Location: [facility];  Service: Endoscopy;;   BIOPSY  05/07/2022   Procedure: BIOPSY;  Surgeon: Stacia Glendia BRAVO, MD;  Location: [facility];  Service: Gastroenterology;;   BONE BIOPSY  03/01/2024   Procedure: BIOPSY, GI;  Surgeon: Leigh Elspeth SQUIBB, MD;  [facility];  Service: Gastroenterology;;   COLONOSCOPY WITH PROPOFOL  N/A  05/08/2022   Procedure: COLONOSCOPY WITH PROPOFOL ;  Surgeon: Stacia Glendia BRAVO, MD;  Location: [facility];  Service: Gastroenterology;  Laterality: N/A;   ENTEROSCOPY N/A 09/20/2020   Procedure: ENTEROSCOPY;  Surgeon: Eda Iha, MD;  Location: [facility] ENDOSCOPY;  Service: Gastroenterology;  Laterality: N/A;   ENTEROSCOPY N/A 05/11/2022   Procedure: ENTEROSCOPY;  Surgeon: Stacia Glendia BRAVO, MD;  Location: [facility];  Service: Gastroenterology;  Laterality: N/A;   ESOPHAGOGASTRODUODENOSCOPY N/A 03/01/2024   Procedure: EGD (ESOPHAGOGASTRODUODENOSCOPY);  Surgeon: Leigh Elspeth SQUIBB, MD;  Location: [facility] ENDOSCOPY;  Service: Gastroenterology;  Laterality: N/A;   ESOPHAGOGASTRODUODENOSCOPY (EGD) WITH PROPOFOL  N/A 08/02/2021   Procedure: ESOPHAGOGASTRODUODENOSCOPY (EGD) WITH PROPOFOL ;  Surgeon: Teressa Toribio SQUIBB, MD;  Location: [facility];  Service: Endoscopy;  Laterality: N/A;   ESOPHAGOGASTRODUODENOSCOPY (EGD) WITH PROPOFOL  N/A 01/31/2022   Procedure: ESOPHAGOGASTRODUODENOSCOPY (EGD) WITH PROPOFOL ;  Surgeon: Teressa Toribio SQUIBB, MD;  Location: [facility];  Service: Gastroenterology;  Laterality: N/A;   ESOPHAGOGASTRODUODENOSCOPY (EGD) WITH PROPOFOL  N/A 05/07/2022   Procedure: ESOPHAGOGASTRODUODENOSCOPY (EGD) WITH PROPOFOL ;  Surgeon: Stacia Glendia BRAVO, MD;  Location: [facility];  Service: Gastroenterology;  Laterality: N/A;   ESOPHAGOGASTRODUODENOSCOPY (EGD) WITH PROPOFOL  N/A 06/13/2022   Procedure: ESOPHAGOGASTRODUODENOSCOPY (EGD) WITH PROPOFOL ;  Surgeon: Wilhelmenia Aloha Raddle., MD;  Location: [facility];  Service: Gastroenterology;  Laterality: N/A;   EUS N/A 01/31/2022   Procedure: UPPER ENDOSCOPIC ULTRASOUND (EUS) RADIAL;  Surgeon: Teressa Toribio SQUIBB, MD;  Location: [facility];  Service: Gastroenterology;  Laterality: N/A;   EUS N/A 06/13/2022   Procedure: UPPER ENDOSCOPIC ULTRASOUND (EUS) LINEAR;  Surgeon: Wilhelmenia Aloha Raddle., MD;  Location: [facility];  Service: Gastroenterology;   Laterality: N/A;   FINE NEEDLE ASPIRATION N/A 08/02/2021   Procedure: FINE NEEDLE ASPIRATION (FNA) LINEAR;  Surgeon: Teressa Toribio SQUIBB, MD;  Location: [facility];  Service: Endoscopy;  Laterality: N/A;   FINE NEEDLE ASPIRATION N/A 01/31/2022   Procedure: FINE NEEDLE ASPIRATION (FNA) LINEAR;  Surgeon: Teressa Toribio SQUIBB, MD;  Location: [facility];  Service: Gastroenterology;  Laterality: N/A;   FINE NEEDLE ASPIRATION N/A 06/13/2022   Procedure: FINE NEEDLE ASPIRATION (FNA) LINEAR;  Surgeon: Wilhelmenia Aloha Raddle., MD;  Location: [facility];  Service: Gastroenterology;  Laterality: N/A;   GIVENS CAPSULE STUDY N/A 05/09/2022   Procedure: GIVENS CAPSULE STUDY;  Surgeon: Stacia Glendia BRAVO, MD;  Location: [facility];  Service: Gastroenterology;  Laterality: N/A;   HARDWARE REMOVAL Right 02/04/2024   Procedure: REMOVAL, HARDWARE;  Surgeon: Harden Jerona GAILS, MD;  [facility];  Service: Orthopedics;  Laterality: Right;  HARDWARE REMOVAL RIGHT ANKLE   HEMOSTASIS CLIP PLACEMENT  05/08/2022   Procedure: HEMOSTASIS CLIP PLACEMENT;  Surgeon: Stacia Glendia BRAVO, MD;  Location: [facility];  Service: Gastroenterology;;   HEMOSTASIS CLIP PLACEMENT  05/11/2022   Procedure: HEMOSTASIS CLIP PLACEMENT;  Surgeon: Stacia Glendia BRAVO, MD;  Location: [facility];  Service: Gastroenterology;;   HEMOSTASIS CLIP PLACEMENT  03/01/2024   Procedure: CONTROL OF HEMORRHAGE, GI TRACT, ENDOSCOPIC, BY CLIPPING OR OVERSEWING;  Surgeon: Leigh Elspeth SQUIBB, MD;  Location: [facility];  Service: Gastroenterology;;   HOT HEMOSTASIS N/A 06/13/2022   Procedure: HOT HEMOSTASIS (ARGON PLASMA COAGULATION/BICAP);  Surgeon: Wilhelmenia Aloha Raddle., MD;  Location: [facility];  Service: Gastroenterology;  Laterality: N/A;   left kidney removed     ORIF ANKLE FRACTURE Right 01/06/2024   Procedure: OPEN REDUCTION INTERNAL FIXATION (ORIF) ANKLE FRACTURE;  Surgeon: Beverley Evalene BIRCH, MD;  Location: [facility];  Service: Orthopedics;   Laterality: Right;   POLYPECTOMY  05/08/2022   Procedure: POLYPECTOMY;  Surgeon: Stacia Glendia BRAVO, MD;  Location: [facility];  Service: Gastroenterology;;   UPPER ESOPHAGEAL ENDOSCOPIC ULTRASOUND (EUS) N/A 08/02/2021   Procedure: UPPER ESOPHAGEAL ENDOSCOPIC ULTRASOUND (EUS);  Surgeon: Teressa Toribio SQUIBB, MD;  Location: [facility];  Service: Endoscopy;  Laterality: N/A;  Radial and Linear   Social History   Occupational History   Not on file  Tobacco Use   Smoking status: Former    Current packs/day: 0.00    Average packs/day: 0.1 packs/day for 25.0 years (2.5 ttl pk-yrs)    Types: Cigarettes    Start date: 16    Quit date: 2019    Years since quitting: 6.7    Passive exposure: Past   Smokeless tobacco: Never   Tobacco comments:    Former smoker 08/08/22  Vaping Use   Vaping status: Never Used  Substance and Sexual Activity   Alcohol use: Not Currently   Drug use: Not Currently   Sexual activity: Not Currently

## 2024-03-29 ENCOUNTER — Other Ambulatory Visit: Payer: Self-pay

## 2024-03-31 ENCOUNTER — Other Ambulatory Visit: Payer: Self-pay

## 2024-04-09 ENCOUNTER — Ambulatory Visit: Admitting: Gastroenterology

## 2024-04-10 ENCOUNTER — Emergency Department (HOSPITAL_COMMUNITY)
Admission: EM | Admit: 2024-04-10 | Discharge: 2024-04-10 | Discharge status: Against Medical Advice | Attending: Emergency Medicine | Admitting: Emergency Medicine

## 2024-04-10 DIAGNOSIS — L089 Local infection of the skin and subcutaneous tissue, unspecified: Secondary | ICD-10-CM | POA: Diagnosis not present

## 2024-04-10 DIAGNOSIS — Z5321 Procedure and treatment not carried out due to patient leaving prior to being seen by health care provider: Secondary | ICD-10-CM | POA: Diagnosis not present

## 2024-04-10 DIAGNOSIS — Z743 Need for continuous supervision: Secondary | ICD-10-CM | POA: Diagnosis not present

## 2024-04-10 LAB — CBG MONITORING, ED: Glucose-Capillary: 375 mg/dL — ABNORMAL HIGH (ref 70–99)
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
## 2024-04-14 ENCOUNTER — Other Ambulatory Visit: Payer: Self-pay

## 2024-04-22 DIAGNOSIS — R531 Weakness: Secondary | ICD-10-CM | POA: Diagnosis not present

## 2024-04-23 ENCOUNTER — Emergency Department (HOSPITAL_COMMUNITY)

## 2024-04-23 ENCOUNTER — Inpatient Hospital Stay (HOSPITAL_COMMUNITY)
Admission: EM | Admit: 2024-04-23 | Discharge: 2024-04-26 | DRG: 638 | Disposition: A | Discharge status: Other Acute Inpatient Hospital | Attending: Internal Medicine | Admitting: Internal Medicine

## 2024-04-23 ENCOUNTER — Encounter (HOSPITAL_COMMUNITY): Payer: Self-pay | Admitting: Emergency Medicine

## 2024-04-23 ENCOUNTER — Other Ambulatory Visit: Payer: Self-pay

## 2024-04-23 DIAGNOSIS — Z794 Long term (current) use of insulin: Secondary | ICD-10-CM

## 2024-04-23 DIAGNOSIS — Z833 Family history of diabetes mellitus: Secondary | ICD-10-CM

## 2024-04-23 DIAGNOSIS — I4891 Unspecified atrial fibrillation: Secondary | ICD-10-CM | POA: Diagnosis present

## 2024-04-23 DIAGNOSIS — H579 Unspecified disorder of eye and adnexa: Secondary | ICD-10-CM | POA: Diagnosis not present

## 2024-04-23 DIAGNOSIS — M869 Osteomyelitis, unspecified: Secondary | ICD-10-CM | POA: Diagnosis present

## 2024-04-23 DIAGNOSIS — K7030 Alcoholic cirrhosis of liver without ascites: Secondary | ICD-10-CM | POA: Diagnosis present

## 2024-04-23 DIAGNOSIS — S8261XA Displaced fracture of lateral malleolus of right fibula, initial encounter for closed fracture: Secondary | ICD-10-CM | POA: Diagnosis not present

## 2024-04-23 DIAGNOSIS — Z79899 Other long term (current) drug therapy: Secondary | ICD-10-CM

## 2024-04-23 DIAGNOSIS — Z91041 Radiographic dye allergy status: Secondary | ICD-10-CM

## 2024-04-23 DIAGNOSIS — D638 Anemia in other chronic diseases classified elsewhere: Secondary | ICD-10-CM | POA: Diagnosis present

## 2024-04-23 DIAGNOSIS — Z743 Need for continuous supervision: Secondary | ICD-10-CM | POA: Diagnosis not present

## 2024-04-23 DIAGNOSIS — E1140 Type 2 diabetes mellitus with diabetic neuropathy, unspecified: Secondary | ICD-10-CM | POA: Diagnosis present

## 2024-04-23 DIAGNOSIS — M86671 Other chronic osteomyelitis, right ankle and foot: Principal | ICD-10-CM | POA: Diagnosis present

## 2024-04-23 DIAGNOSIS — Z5982 Transportation insecurity: Secondary | ICD-10-CM

## 2024-04-23 DIAGNOSIS — Z87891 Personal history of nicotine dependence: Secondary | ICD-10-CM

## 2024-04-23 DIAGNOSIS — R569 Unspecified convulsions: Secondary | ICD-10-CM | POA: Diagnosis present

## 2024-04-23 DIAGNOSIS — Z5941 Food insecurity: Secondary | ICD-10-CM

## 2024-04-23 DIAGNOSIS — Z886 Allergy status to analgesic agent status: Secondary | ICD-10-CM

## 2024-04-23 DIAGNOSIS — E1169 Type 2 diabetes mellitus with other specified complication: Principal | ICD-10-CM | POA: Diagnosis present

## 2024-04-23 DIAGNOSIS — F10920 Alcohol use, unspecified with intoxication, uncomplicated: Secondary | ICD-10-CM

## 2024-04-23 DIAGNOSIS — Z7984 Long term (current) use of oral hypoglycemic drugs: Secondary | ICD-10-CM

## 2024-04-23 DIAGNOSIS — K7010 Alcoholic hepatitis without ascites: Secondary | ICD-10-CM | POA: Diagnosis present

## 2024-04-23 DIAGNOSIS — Y908 Blood alcohol level of 240 mg/100 ml or more: Secondary | ICD-10-CM | POA: Diagnosis present

## 2024-04-23 DIAGNOSIS — R45851 Suicidal ideations: Secondary | ICD-10-CM | POA: Diagnosis present

## 2024-04-23 DIAGNOSIS — I10 Essential (primary) hypertension: Secondary | ICD-10-CM | POA: Diagnosis present

## 2024-04-23 DIAGNOSIS — I8510 Secondary esophageal varices without bleeding: Secondary | ICD-10-CM | POA: Diagnosis present

## 2024-04-23 DIAGNOSIS — Z7901 Long term (current) use of anticoagulants: Secondary | ICD-10-CM

## 2024-04-23 DIAGNOSIS — M79673 Pain in unspecified foot: Secondary | ICD-10-CM | POA: Diagnosis not present

## 2024-04-23 DIAGNOSIS — Z59868 Other specified financial insecurity: Secondary | ICD-10-CM

## 2024-04-23 DIAGNOSIS — F10220 Alcohol dependence with intoxication, uncomplicated: Secondary | ICD-10-CM | POA: Diagnosis present

## 2024-04-23 LAB — ETHANOL: Alcohol, Ethyl (B): 255 mg/dL — ABNORMAL HIGH (ref <15)

## 2024-04-23 LAB — COMPREHENSIVE METABOLIC PANEL WITH GFR
ALT: 19 U/L (ref 0–44)
AST: 49 U/L — ABNORMAL HIGH (ref 15–41)
Albumin: 3.5 g/dL (ref 3.5–5.0)
Alkaline Phosphatase: 352 U/L — ABNORMAL HIGH (ref 38–126)
Anion gap: 16 — ABNORMAL HIGH (ref 5–15)
BUN: 8 mg/dL (ref 6–20)
CO2: 24 mmol/L (ref 22–32)
Calcium: 9.5 mg/dL (ref 8.9–10.3)
Chloride: 103 mmol/L (ref 98–111)
Creatinine, Ser: 0.52 mg/dL — ABNORMAL LOW (ref 0.61–1.24)
GFR, Estimated: >60 mL/min (ref >60)
Glucose, Bld: 290 mg/dL — ABNORMAL HIGH (ref 70–99)
Potassium: 3.8 mmol/L (ref 3.5–5.1)
Sodium: 143 mmol/L (ref 135–145)
Total Bilirubin: 1.8 mg/dL — ABNORMAL HIGH (ref 0.0–1.2)
Total Protein: 9 g/dL — ABNORMAL HIGH (ref 6.5–8.1)

## 2024-04-23 LAB — CBC
HCT: 31.7 % — ABNORMAL LOW (ref 39.0–52.0)
Hemoglobin: 9.3 g/dL — ABNORMAL LOW (ref 13.0–17.0)
MCH: 24.4 pg — ABNORMAL LOW (ref 26.0–34.0)
MCHC: 29.3 g/dL — ABNORMAL LOW (ref 30.0–36.0)
MCV: 83.2 fL (ref 80.0–100.0)
Platelets: 208 K/uL (ref 150–400)
RBC: 3.81 MIL/uL — ABNORMAL LOW (ref 4.22–5.81)
RDW: 21.7 % — ABNORMAL HIGH (ref 11.5–15.5)
WBC: 6.2 K/uL (ref 4.0–10.5)
nRBC: 0 % (ref 0.0–0.2)

## 2024-04-23 MED ORDER — VANCOMYCIN HCL IN DEXTROSE 1-5 GM/200ML-% IV SOLN
1000.0000 mg | Freq: Once | INTRAVENOUS | Status: AC
  Administered 2024-04-23: 1000 mg via INTRAVENOUS
  Filled 2024-04-23: qty 200

## 2024-04-23 MED ORDER — LACTATED RINGERS IV BOLUS
1000.0000 mL | Freq: Once | INTRAVENOUS | Status: AC
  Administered 2024-04-23: 1000 mL via INTRAVENOUS

## 2024-04-23 MED ORDER — SODIUM CHLORIDE 0.9 % IV SOLN
3.0000 g | Freq: Once | INTRAVENOUS | Status: AC
  Administered 2024-04-23: 3 g via INTRAVENOUS
  Filled 2024-04-23: qty 8

## 2024-04-23 MED ORDER — LEVETIRACETAM 500 MG PO TABS
1000.0000 mg | ORAL_TABLET | Freq: Two times a day (BID) | ORAL | Status: DC
  Administered 2024-04-23 – 2024-04-26 (×6): 1000 mg via ORAL
  Filled 2024-04-23 (×6): qty 2

## 2024-04-23 NOTE — ED Provider Notes (Signed)
 [name] Provider Note   CSN: [id number] Arrival date & time: 04/23/24  2006     Patient presents with: Wound Check   Gary Mitchell is a 53 y.o. male.    Wound Check     Patient has a history of seizures, cirrhosis, alcoholism, diabetes, atrial fibrillation.  Patient also has a history of infected hardware in his right lower extremity.  Patient has prior ankle fracture.  He developed osteomyelitis.  Patient ended up having hardware removal.  Patient continued to have osteomyelitis despite antibiotic therapy.  In in July of this year he saw Dr. Harden who recommended amputation.  Patient declined that treatment.  He is continue to follow with infectious disease and his primary doctor.  Patient continues to have difficulty bearing weight.  He has open wounds on both his medial and lateral malleolus.  Police were called due to a family disturbance.  Patient was felt to be intoxicated.  They were not able to take the patient in jail because of the condition of his right ankle.  Patient reports ankle pain.  Triage mentions patient indicating suicidal ideation but he denies that to me.  Patient states he is thirsty and hungry  Prior to Admission medications   Medication Sig Start Date End Date Taking? Authorizing Provider  acetaminophen  (TYLENOL ) 500 MG tablet Take 1,500 mg by mouth daily as needed for moderate pain (pain score 4-6), fever or headache.    [provider]  apixaban  (ELIQUIS ) 2.5 MG TABS tablet Take 1 tablet (2.5 mg total) by mouth 2 (two) times daily for 28 days. 01/08/24 07/07/24  Drusilla Sabas RAMAN, MD  atorvastatin  (LIPITOR) 20 MG tablet Take 1 tablet (20 mg total) by mouth daily. 04/02/23   Laurence Locus, DO  Continuous Glucose Sensor (FREESTYLE LIBRE 3 PLUS SENSOR) MISC Change sensor every 15 days. Use to check blood sugar continuously. 12/15/23   Newlin, Enobong, MD  empagliflozin  (JARDIANCE ) 10 MG TABS tablet Take 1 tablet (10  mg total) by mouth daily before breakfast. 11/17/23   Newlin, Enobong, MD  folic acid  (FOLVITE ) 1 MG tablet Take 1 tablet (1 mg total) by mouth daily. 03/05/24   Amin, Ankit C, MD  ibuprofen  (ADVIL ) 200 MG tablet Take 400 mg by mouth every 6 (six) hours as needed for mild pain (pain score 1-3) or moderate pain (pain score 4-6).    [provider]  insulin  glargine (LANTUS  SOLOSTAR) 100 UNIT/ML Solostar Pen Inject 32 Units into the skin daily. 03/22/24   Newlin, Enobong, MD  insulin  lispro (HUMALOG  KWIKPEN) 100 UNIT/ML KwikPen Inject 10 Units into the skin 3 (three) times daily. 01/18/24   Trine Raynell Moder, MD  lactulose  (CHRONULAC ) 10 GM/15ML solution Take 45 mLs (30 g total) by mouth as needed for mild constipation. 12/08/23   Beather Delon Gibson, PA  levETIRAcetam  (KEPPRA ) 1000 MG tablet Take 1 tablet (1,000 mg total) by mouth 2 (two) times daily. 03/04/24   Amin, Ankit C, MD  Menthol-Methyl Salicylate (MUSCLE RUB) 10-15 % CREA Apply 1 Application topically as needed for muscle pain (arm pain).    [provider]  metFORMIN  (GLUCOPHAGE ) 500 MG tablet Take 2 tablets (1,000 mg total) by mouth 2 (two) times daily with a meal. 07/11/23   Delbert Clam, MD  metoprolol  succinate (TOPROL -XL) 25 MG 24 hr tablet Take 0.5 tablets (12.5 mg total) by mouth daily. 01/08/24   Drusilla Sabas RAMAN, MD  Multiple Vitamin (MULTIVITAMIN WITH MINERALS) TABS  tablet Take 1 tablet by mouth daily. 03/04/24   Amin, Burgess BROCKS, MD  pantoprazole  (PROTONIX ) 40 MG tablet Take 1 tablet (40 mg total) by mouth 2 (two) times daily before a meal for 60 days, THEN 1 tablet (40 mg total) daily before breakfast. 03/04/24 06/02/24  Amin, Ankit C, MD  thiamine  (VITAMIN B1) 100 MG tablet Take 1 tablet (100 mg total) by mouth daily. 03/05/24   Caleen Burgess BROCKS, MD    Allergies: Iohexol  and Nsaids    Review of Systems  Updated Vital Signs BP 116/78   Pulse (!) 111   Temp 98 F (36.7 C)   Resp 20   SpO2 100%   Physical  Exam  (all labs ordered are listed, but only abnormal results are displayed) Labs Reviewed  CBC - Abnormal; Notable for the following components:      Result Value   RBC 3.81 (*)    Hemoglobin 9.3 (*)    HCT 31.7 (*)    MCH 24.4 (*)    MCHC 29.3 (*)    RDW 21.7 (*)    All other components within normal limits  COMPREHENSIVE METABOLIC PANEL WITH GFR - Abnormal; Notable for the following components:   Glucose, Bld 290 (*)    Creatinine, Ser 0.52 (*)    Total Protein 9.0 (*)    AST 49 (*)    Alkaline Phosphatase 352 (*)    Total Bilirubin 1.8 (*)    Anion gap 16 (*)    All other components within normal limits  ETHANOL - Abnormal; Notable for the following components:   Alcohol, Ethyl (B) 255 (*)    All other components within normal limits    EKG: None  Radiology: DG Ankle Complete Right Result Date: 04/23/2024 CLINICAL DATA:  Pain, open wounds, drainage EXAM: RIGHT ANKLE - COMPLETE 3+ VIEW COMPARISON:  02/28/2024 FINDINGS: Frontal, oblique, lateral views of the right ankle are obtained. 2 cannulated screws are again seen traversing the medial malleolus, with increased lucency surrounding the screws consistent with infection and/or loosening. There is further distraction of the medial and lateral malleolar fractures, with increased angulation at the lateral malleolar fracture site. Bony resorption is seen along the tibial plafond lateral aspect as well as along the talar dome. Findings are concerning for underlying osteomyelitis and septic arthritis. Increased callus formation along the lateral malleolar fracture site. Marked increased soft tissue swelling. No subcutaneous gas or radiopaque foreign body. IMPRESSION: 1. Increased soft tissue swelling, with findings consistent with progressive osteomyelitis and septic arthritis of the right ankle. Electronically Signed   By: Ozell Daring M.D.   On: 04/23/2024 21:43     Procedures   Medications Ordered in the ED   Ampicillin -Sulbactam (UNASYN ) 3 g in sodium chloride  0.9 % 100 mL IVPB (has no administration in time range)    And  vancomycin  (VANCOCIN ) IVPB 1000 mg/200 mL premix (has no administration in time range)  levETIRAcetam  (KEPPRA ) tablet 1,000 mg (has no administration in time range)  thiamine  (VITAMIN B1) tablet 100 mg (has no administration in time range)  lactated ringers  bolus 1,000 mL (has no administration in time range)    Clinical Course as of 04/23/24 2358  Fri Apr 23, 2024  2222 Ethanol(!) Alcohol intoxication noted.  Metabolic panel does show hyperglycemia.  Alkaline phosphatase and bilirubin elevated.  This has been noted previously [JK]  2223 CBC(!) No leukocytosis anemia stable [JK]  2223 Patient has increased soft tissue swelling with findings consistent with progressive osteomyelitis  and septic arthritis of the right ankle [JK]  2307 Case discussed with Dr Vernetta.  Would recommend admission to [facility].  Ortho will consult  [JK]  2357 Case discussed with Dr. Marcene [JK]    Clinical Course User Index [JK] Randol Simmonds, MD                                 Medical Decision Making Amount and/or Complexity of Data Reviewed Labs: ordered. Decision-making details documented in ED Course. Radiology: ordered.  Risk OTC drugs. Prescription drug management. Decision regarding hospitalization.   Patient presented to the ED after initially being brought to jail because of his behavioral disturbance.  They did not feel that he was medically stable and he was sent to the ED.  Patient does have history of a chronic osteomyelitis of his ankle.  He has been followed by infectious disease and orthopedics.  They have recommended amputation but the patient has declined.  Patient does not have any signs of fever or systemic infection at this time.  Patient's laboratory test show hyperglycemia but no signs of acidosis.  He is chronic anemia which is stable.  Patient is intoxicated with alcohol  level 255.  Patient's x-rays show worsening destruction of the bones in his ankle.  His osteomyelitis appears to be progressing.  Infectious disease and orthopedics did not feel that he would respond to continuing antibiotics.  Patient still declines any surgical intervention at this time.  Of consult with orthopedics will bring him in the hospital for IV antibiotics.  Patient will need to be monitored for alcohol withdrawal.     Final diagnoses:  Other chronic osteomyelitis of right ankle (HCC)  Alcoholic intoxication without complication    ED Discharge Orders     None          Randol Simmonds, MD 04/23/24 2359

## 2024-04-23 NOTE — ED Triage Notes (Addendum)
 Patient BIB EMS for wound check. Patient was suppose to be check in jail d/t family disturbances but unable d/t condition of right ankle. ETOH on board. Patient c/o 10/10 right ankle pain. Patient report SI.  Hx Ankle fracture   BP 142/86 HR110 RR 20  O2sat 99% on RA

## 2024-04-23 NOTE — ED Notes (Signed)
 RN tried to put on CAM boots but Patient stated I'm gonna wear it but not right now.

## 2024-04-24 DIAGNOSIS — Z886 Allergy status to analgesic agent status: Secondary | ICD-10-CM | POA: Diagnosis not present

## 2024-04-24 DIAGNOSIS — S8261XA Displaced fracture of lateral malleolus of right fibula, initial encounter for closed fracture: Secondary | ICD-10-CM | POA: Diagnosis not present

## 2024-04-24 DIAGNOSIS — Z7689 Persons encountering health services in other specified circumstances: Secondary | ICD-10-CM | POA: Diagnosis not present

## 2024-04-24 DIAGNOSIS — I10 Essential (primary) hypertension: Secondary | ICD-10-CM | POA: Diagnosis not present

## 2024-04-24 DIAGNOSIS — Z59868 Other specified financial insecurity: Secondary | ICD-10-CM | POA: Diagnosis not present

## 2024-04-24 DIAGNOSIS — M86471 Chronic osteomyelitis with draining sinus, right ankle and foot: Secondary | ICD-10-CM

## 2024-04-24 DIAGNOSIS — D638 Anemia in other chronic diseases classified elsewhere: Secondary | ICD-10-CM | POA: Diagnosis not present

## 2024-04-24 DIAGNOSIS — I4891 Unspecified atrial fibrillation: Secondary | ICD-10-CM | POA: Diagnosis not present

## 2024-04-24 DIAGNOSIS — R569 Unspecified convulsions: Secondary | ICD-10-CM | POA: Diagnosis not present

## 2024-04-24 DIAGNOSIS — Z79899 Other long term (current) drug therapy: Secondary | ICD-10-CM | POA: Diagnosis not present

## 2024-04-24 DIAGNOSIS — Z5941 Food insecurity: Secondary | ICD-10-CM | POA: Diagnosis not present

## 2024-04-24 DIAGNOSIS — E1140 Type 2 diabetes mellitus with diabetic neuropathy, unspecified: Secondary | ICD-10-CM | POA: Diagnosis not present

## 2024-04-24 DIAGNOSIS — Z833 Family history of diabetes mellitus: Secondary | ICD-10-CM | POA: Diagnosis not present

## 2024-04-24 DIAGNOSIS — R45851 Suicidal ideations: Secondary | ICD-10-CM | POA: Diagnosis not present

## 2024-04-24 DIAGNOSIS — Z91041 Radiographic dye allergy status: Secondary | ICD-10-CM | POA: Diagnosis not present

## 2024-04-24 DIAGNOSIS — Z7401 Bed confinement status: Secondary | ICD-10-CM | POA: Diagnosis not present

## 2024-04-24 DIAGNOSIS — M86671 Other chronic osteomyelitis, right ankle and foot: Secondary | ICD-10-CM

## 2024-04-24 DIAGNOSIS — Z7984 Long term (current) use of oral hypoglycemic drugs: Secondary | ICD-10-CM | POA: Diagnosis not present

## 2024-04-24 DIAGNOSIS — Z5982 Transportation insecurity: Secondary | ICD-10-CM | POA: Diagnosis not present

## 2024-04-24 DIAGNOSIS — I8510 Secondary esophageal varices without bleeding: Secondary | ICD-10-CM | POA: Diagnosis not present

## 2024-04-24 DIAGNOSIS — M869 Osteomyelitis, unspecified: Secondary | ICD-10-CM | POA: Diagnosis not present

## 2024-04-24 DIAGNOSIS — Z87891 Personal history of nicotine dependence: Secondary | ICD-10-CM | POA: Diagnosis not present

## 2024-04-24 DIAGNOSIS — Y908 Blood alcohol level of 240 mg/100 ml or more: Secondary | ICD-10-CM | POA: Diagnosis present

## 2024-04-24 DIAGNOSIS — K7010 Alcoholic hepatitis without ascites: Secondary | ICD-10-CM | POA: Diagnosis not present

## 2024-04-24 DIAGNOSIS — B999 Unspecified infectious disease: Secondary | ICD-10-CM | POA: Diagnosis not present

## 2024-04-24 DIAGNOSIS — Z794 Long term (current) use of insulin: Secondary | ICD-10-CM | POA: Diagnosis not present

## 2024-04-24 DIAGNOSIS — F10220 Alcohol dependence with intoxication, uncomplicated: Secondary | ICD-10-CM | POA: Diagnosis not present

## 2024-04-24 DIAGNOSIS — F10920 Alcohol use, unspecified with intoxication, uncomplicated: Secondary | ICD-10-CM

## 2024-04-24 DIAGNOSIS — E1169 Type 2 diabetes mellitus with other specified complication: Secondary | ICD-10-CM | POA: Diagnosis not present

## 2024-04-24 DIAGNOSIS — K7030 Alcoholic cirrhosis of liver without ascites: Secondary | ICD-10-CM | POA: Diagnosis not present

## 2024-04-24 DIAGNOSIS — Z7901 Long term (current) use of anticoagulants: Secondary | ICD-10-CM | POA: Diagnosis not present

## 2024-04-24 LAB — CBC WITH DIFFERENTIAL/PLATELET
Abs Immature Granulocytes: 0.01 K/uL (ref 0.00–0.07)
Basophils Absolute: 0.1 K/uL (ref 0.0–0.1)
Basophils Relative: 1 %
Eosinophils Absolute: 0.1 K/uL (ref 0.0–0.5)
Eosinophils Relative: 1 %
HCT: 24.4 % — ABNORMAL LOW (ref 39.0–52.0)
Hemoglobin: 7.8 g/dL — ABNORMAL LOW (ref 13.0–17.0)
Immature Granulocytes: 0 %
Lymphocytes Relative: 30 %
Lymphs Abs: 1.3 K/uL (ref 0.7–4.0)
MCH: 25.6 pg — ABNORMAL LOW (ref 26.0–34.0)
MCHC: 32 g/dL (ref 30.0–36.0)
MCV: 80 fL (ref 80.0–100.0)
Monocytes Absolute: 0.6 K/uL (ref 0.1–1.0)
Monocytes Relative: 12 %
Neutro Abs: 2.5 K/uL (ref 1.7–7.7)
Neutrophils Relative %: 56 %
Platelets: 121 K/uL — ABNORMAL LOW (ref 150–400)
RBC: 3.05 MIL/uL — ABNORMAL LOW (ref 4.22–5.81)
RDW: 21 % — ABNORMAL HIGH (ref 11.5–15.5)
WBC: 4.5 K/uL (ref 4.0–10.5)
nRBC: 0 % (ref 0.0–0.2)

## 2024-04-24 LAB — COMPREHENSIVE METABOLIC PANEL WITH GFR
ALT: 14 U/L (ref 0–44)
AST: 35 U/L (ref 15–41)
Albumin: 2.1 g/dL — ABNORMAL LOW (ref 3.5–5.0)
Alkaline Phosphatase: 219 U/L — ABNORMAL HIGH (ref 38–126)
Anion gap: 14 (ref 5–15)
BUN: 5 mg/dL — ABNORMAL LOW (ref 6–20)
CO2: 23 mmol/L (ref 22–32)
Calcium: 8.2 mg/dL — ABNORMAL LOW (ref 8.9–10.3)
Chloride: 99 mmol/L (ref 98–111)
Creatinine, Ser: 0.42 mg/dL — ABNORMAL LOW (ref 0.61–1.24)
GFR, Estimated: >60 mL/min (ref >60)
Glucose, Bld: 256 mg/dL — ABNORMAL HIGH (ref 70–99)
Potassium: 3.6 mmol/L (ref 3.5–5.1)
Sodium: 136 mmol/L (ref 135–145)
Total Bilirubin: 2 mg/dL — ABNORMAL HIGH (ref 0.0–1.2)
Total Protein: 6.9 g/dL (ref 6.5–8.1)

## 2024-04-24 LAB — GLUCOSE, CAPILLARY
Glucose-Capillary: 190 mg/dL — ABNORMAL HIGH (ref 70–99)
Glucose-Capillary: 244 mg/dL — ABNORMAL HIGH (ref 70–99)
Glucose-Capillary: 333 mg/dL — ABNORMAL HIGH (ref 70–99)
Glucose-Capillary: 377 mg/dL — ABNORMAL HIGH (ref 70–99)
Glucose-Capillary: 217 mg/dL — ABNORMAL HIGH (ref 70–99)

## 2024-04-24 LAB — HEMOGLOBIN AND HEMATOCRIT, BLOOD
HCT: 24.7 % — ABNORMAL LOW (ref 39.0–52.0)
Hemoglobin: 7.8 g/dL — ABNORMAL LOW (ref 13.0–17.0)

## 2024-04-24 LAB — CBG MONITORING, ED: Glucose-Capillary: 225 mg/dL — ABNORMAL HIGH (ref 70–99)

## 2024-04-24 LAB — MAGNESIUM: Magnesium: 0.9 mg/dL — CL (ref 1.7–2.4)

## 2024-04-24 LAB — SEDIMENTATION RATE: Sed Rate: 25 mm/h — ABNORMAL HIGH (ref 0–16)

## 2024-04-24 LAB — C-REACTIVE PROTEIN: CRP: 0.5 mg/dL (ref <1.0)

## 2024-04-24 MED ORDER — LORAZEPAM 1 MG PO TABS: 1.0000 mg | ORAL_TABLET | ORAL | Status: DC | PRN

## 2024-04-24 MED ORDER — INSULIN ASPART 100 UNIT/ML IJ SOLN
0.0000 [IU] | Freq: Three times a day (TID) | INTRAMUSCULAR | Status: DC
  Administered 2024-04-24: 3 [IU] via SUBCUTANEOUS
  Administered 2024-04-24: 9 [IU] via SUBCUTANEOUS
  Administered 2024-04-24: 7 [IU] via SUBCUTANEOUS
  Administered 2024-04-25: 3 [IU] via SUBCUTANEOUS
  Administered 2024-04-25: 2 [IU] via SUBCUTANEOUS
  Administered 2024-04-25: 5 [IU] via SUBCUTANEOUS
  Administered 2024-04-26 (×2): 2 [IU] via SUBCUTANEOUS
  Filled 2024-04-24: qty 0.09

## 2024-04-24 MED ORDER — ACETAMINOPHEN 650 MG RE SUPP: 650.0000 mg | Freq: Four times a day (QID) | RECTAL | Status: DC | PRN

## 2024-04-24 MED ORDER — ATORVASTATIN CALCIUM 10 MG PO TABS
20.0000 mg | ORAL_TABLET | Freq: Every day | ORAL | Status: DC
  Administered 2024-04-24 – 2024-04-26 (×3): 20 mg via ORAL
  Filled 2024-04-24 (×3): qty 2

## 2024-04-24 MED ORDER — THIAMINE MONONITRATE 100 MG PO TABS
100.0000 mg | ORAL_TABLET | Freq: Every day | ORAL | Status: DC
Start: 2024-04-24 — End: 2024-04-26
  Administered 2024-04-24 – 2024-04-26 (×3): 100 mg via ORAL
  Filled 2024-04-24 (×5): qty 1

## 2024-04-24 MED ORDER — ONDANSETRON HCL 4 MG/2ML IJ SOLN
4.0000 mg | Freq: Four times a day (QID) | INTRAMUSCULAR | Status: DC | PRN
  Administered 2024-04-26: 4 mg via INTRAVENOUS
  Filled 2024-04-24: qty 2

## 2024-04-24 MED ORDER — ACETAMINOPHEN 325 MG PO TABS: 650.0000 mg | ORAL_TABLET | Freq: Four times a day (QID) | ORAL | Status: DC | PRN

## 2024-04-24 MED ORDER — LORAZEPAM 2 MG/ML IJ SOLN: 1.0000 mg | INTRAMUSCULAR | Status: DC | PRN

## 2024-04-24 MED ORDER — OXYCODONE HCL 5 MG PO TABS
5.0000 mg | ORAL_TABLET | ORAL | Status: DC | PRN
  Administered 2024-04-25 – 2024-04-26 (×4): 5 mg via ORAL
  Filled 2024-04-24 (×4): qty 1

## 2024-04-24 MED ORDER — INSULIN ASPART 100 UNIT/ML IJ SOLN: 0.0000 [IU] | Freq: Every day | INTRAMUSCULAR | Status: DC

## 2024-04-24 MED ORDER — INSULIN GLARGINE-YFGN 100 UNIT/ML ~~LOC~~ SOLN
32.0000 [IU] | Freq: Every day | SUBCUTANEOUS | Status: DC
  Administered 2024-04-24 – 2024-04-26 (×3): 32 [IU] via SUBCUTANEOUS
  Filled 2024-04-24 (×3): qty 0.32

## 2024-04-24 MED ORDER — LORAZEPAM 1 MG PO TABS: 0.0000 mg | ORAL_TABLET | Freq: Two times a day (BID) | ORAL | Status: DC

## 2024-04-24 MED ORDER — INSULIN ASPART 100 UNIT/ML IJ SOLN
0.0000 [IU] | Freq: Every day | INTRAMUSCULAR | Status: DC
  Administered 2024-04-24 (×2): 2 [IU] via SUBCUTANEOUS
  Administered 2024-04-25: 3 [IU] via SUBCUTANEOUS
  Filled 2024-04-24: qty 0.05

## 2024-04-24 MED ORDER — FOLIC ACID 1 MG PO TABS: 1.0000 mg | ORAL_TABLET | Freq: Every day | ORAL | Status: DC

## 2024-04-24 MED ORDER — SODIUM CHLORIDE 0.9 % IV SOLN
2.0000 g | Freq: Three times a day (TID) | INTRAVENOUS | Status: DC
  Administered 2024-04-24 – 2024-04-26 (×7): 2 g via INTRAVENOUS
  Filled 2024-04-24 (×7): qty 12.5

## 2024-04-24 MED ORDER — METOPROLOL SUCCINATE ER 25 MG PO TB24
12.5000 mg | ORAL_TABLET | Freq: Every day | ORAL | Status: DC
  Administered 2024-04-24 – 2024-04-26 (×3): 12.5 mg via ORAL
  Filled 2024-04-24 (×3): qty 1

## 2024-04-24 MED ORDER — EMPAGLIFLOZIN 10 MG PO TABS
10.0000 mg | ORAL_TABLET | Freq: Every day | ORAL | Status: DC
  Administered 2024-04-25 – 2024-04-26 (×2): 10 mg via ORAL
  Filled 2024-04-24 (×2): qty 1

## 2024-04-24 MED ORDER — FENTANYL CITRATE (PF) 50 MCG/ML IJ SOSY
25.0000 ug | PREFILLED_SYRINGE | INTRAMUSCULAR | Status: DC | PRN
  Administered 2024-04-24 – 2024-04-25 (×2): 25 ug via INTRAVENOUS
  Filled 2024-04-24 (×2): qty 1

## 2024-04-24 MED ORDER — MELATONIN 3 MG PO TABS: 3.0000 mg | ORAL_TABLET | Freq: Every evening | ORAL | Status: DC | PRN

## 2024-04-24 MED ORDER — PANTOPRAZOLE SODIUM 40 MG PO TBEC
40.0000 mg | DELAYED_RELEASE_TABLET | Freq: Two times a day (BID) | ORAL | Status: DC
  Administered 2024-04-24 – 2024-04-26 (×4): 40 mg via ORAL
  Filled 2024-04-24 (×4): qty 1

## 2024-04-24 MED ORDER — LORAZEPAM 1 MG PO TABS
1.0000 mg | ORAL_TABLET | ORAL | Status: DC | PRN
  Administered 2024-04-24: 1 mg via ORAL
  Filled 2024-04-24: qty 1

## 2024-04-24 MED ORDER — VANCOMYCIN HCL 1750 MG/350ML IV SOLN
1750.0000 mg | INTRAVENOUS | Status: DC
  Administered 2024-04-24 – 2024-04-25 (×2): 1750 mg via INTRAVENOUS
  Filled 2024-04-24 (×3): qty 350

## 2024-04-24 MED ORDER — MAGNESIUM SULFATE 4 GM/100ML IV SOLN
4.0000 g | Freq: Once | INTRAVENOUS | Status: AC
  Administered 2024-04-24: 4 g via INTRAVENOUS
  Filled 2024-04-24: qty 100

## 2024-04-24 MED ORDER — LACTATED RINGERS IV SOLN: INTRAVENOUS | Status: AC

## 2024-04-24 MED ORDER — ADULT MULTIVITAMIN W/MINERALS CH
1.0000 | ORAL_TABLET | Freq: Every day | ORAL | Status: DC
  Administered 2024-04-24 – 2024-04-26 (×3): 1 via ORAL
  Filled 2024-04-24 (×3): qty 1

## 2024-04-24 MED ORDER — ENSURE PLUS HIGH PROTEIN PO LIQD
237.0000 mL | Freq: Two times a day (BID) | ORAL | Status: DC
  Administered 2024-04-24 – 2024-04-26 (×5): 237 mL via ORAL

## 2024-04-24 MED ORDER — LORAZEPAM 1 MG PO TABS
0.0000 mg | ORAL_TABLET | Freq: Four times a day (QID) | ORAL | Status: AC
  Administered 2024-04-24 (×2): 1 mg via ORAL
  Filled 2024-04-24 (×2): qty 1

## 2024-04-24 MED ORDER — FOLIC ACID 1 MG PO TABS
1.0000 mg | ORAL_TABLET | Freq: Every day | ORAL | Status: DC
  Administered 2024-04-24 – 2024-04-26 (×3): 1 mg via ORAL
  Filled 2024-04-24 (×3): qty 1

## 2024-04-24 MED ORDER — INSULIN LISPRO (1 UNIT DIAL) 100 UNIT/ML (KWIKPEN): 10.0000 [IU] | PEN_INJECTOR | Freq: Three times a day (TID) | SUBCUTANEOUS | Status: DC

## 2024-04-24 NOTE — Consult Note (Signed)
 ORTHOPAEDIC CONSULTATION  REQUESTING PHYSICIAN: Cindy Garnette POUR, MD  Chief Complaint: Chronic osteomyelitis right ankle  HPI: Gary Mitchell is a 53 y.o. male who presents with chronic osteomyelitis right ankle.  Patient is status post hardware removal in July and has had recurrent ulceration and progressive joint destruction secondary to chronic osteomyelitis.  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   Seizures (HCC)    Seizures (HCC)    Thrombocytopenia    Past Surgical History:  Procedure Laterality Date   BIOPSY  08/02/2021   Procedure: BIOPSY;  Surgeon: Teressa Toribio SQUIBB, MD;  Location: [facility];  Service: Endoscopy;;   BIOPSY  05/07/2022   Procedure: BIOPSY;  Surgeon: Stacia Glendia BRAVO, MD;  Location: [facility];  Service: Gastroenterology;;   BONE BIOPSY  03/01/2024   Procedure: BIOPSY, GI;  Surgeon: Leigh Elspeth SQUIBB, MD;  Location: [facility] ENDOSCOPY;  Service: Gastroenterology;;   COLONOSCOPY WITH PROPOFOL  N/A 05/08/2022   Procedure: COLONOSCOPY WITH PROPOFOL ;  Surgeon: Stacia Glendia BRAVO, MD;  Location: [facility];  Service: Gastroenterology;  Laterality: N/A;   ENTEROSCOPY N/A 09/20/2020   Procedure: ENTEROSCOPY;  Surgeon: Eda Iha, MD;  Location: [facility] ENDOSCOPY;  Service: Gastroenterology;  Laterality: N/A;   ENTEROSCOPY N/A 05/11/2022   Procedure: ENTEROSCOPY;   Surgeon: Stacia Glendia BRAVO, MD;  Location: [facility];  Service: Gastroenterology;  Laterality: N/A;   ESOPHAGOGASTRODUODENOSCOPY N/A 03/01/2024   Procedure: EGD (ESOPHAGOGASTRODUODENOSCOPY);  Surgeon: Leigh Elspeth SQUIBB, MD;  Location: [facility] ENDOSCOPY;  Service: Gastroenterology;  Laterality: N/A;   ESOPHAGOGASTRODUODENOSCOPY (EGD) WITH PROPOFOL  N/A 08/02/2021   Procedure: ESOPHAGOGASTRODUODENOSCOPY (EGD) WITH PROPOFOL ;  Surgeon: Teressa Toribio SQUIBB, MD;  Location: [facility];  Service: Endoscopy;  Laterality: N/A;   ESOPHAGOGASTRODUODENOSCOPY (EGD) WITH PROPOFOL  N/A 01/31/2022   Procedure: ESOPHAGOGASTRODUODENOSCOPY (EGD) WITH PROPOFOL ;  Surgeon: Teressa Toribio SQUIBB, MD;  Location: [facility];  Service: Gastroenterology;  Laterality: N/A;   ESOPHAGOGASTRODUODENOSCOPY (EGD) WITH PROPOFOL  N/A 05/07/2022   Procedure: ESOPHAGOGASTRODUODENOSCOPY (EGD) WITH PROPOFOL ;  Surgeon: Stacia Glendia BRAVO, MD;  Location: [facility];  Service: Gastroenterology;  Laterality: N/A;   ESOPHAGOGASTRODUODENOSCOPY (EGD) WITH PROPOFOL  N/A 06/13/2022   Procedure: ESOPHAGOGASTRODUODENOSCOPY (EGD) WITH PROPOFOL ;  Surgeon: Wilhelmenia Aloha Raddle., MD;  Location: [facility];  Service: Gastroenterology;  Laterality: N/A;   EUS N/A 01/31/2022   Procedure: UPPER ENDOSCOPIC ULTRASOUND (EUS) RADIAL;  Surgeon: Teressa Toribio SQUIBB, MD;  Location: [facility];  Service: Gastroenterology;  Laterality: N/A;   EUS N/A 06/13/2022   Procedure: UPPER ENDOSCOPIC ULTRASOUND (EUS) LINEAR;  Surgeon: Wilhelmenia Aloha Raddle., MD;  Location: [facility];  Service: Gastroenterology;  Laterality: N/A;   FINE NEEDLE ASPIRATION N/A 08/02/2021   Procedure: FINE NEEDLE ASPIRATION (FNA) LINEAR;  Surgeon: Teressa Toribio SQUIBB, MD;  Location: [facility];  Service: Endoscopy;  Laterality: N/A;   FINE NEEDLE ASPIRATION N/A 01/31/2022  Procedure: FINE NEEDLE ASPIRATION (FNA) LINEAR;  Surgeon: Teressa Toribio SQUIBB, MD;  Location: [facility];  Service:  Gastroenterology;  Laterality: N/A;   FINE NEEDLE ASPIRATION N/A 06/13/2022   Procedure: FINE NEEDLE ASPIRATION (FNA) LINEAR;  Surgeon: Wilhelmenia Aloha Raddle., MD;  Location: [facility];  Service: Gastroenterology;  Laterality: N/A;   GIVENS CAPSULE STUDY N/A 05/09/2022   Procedure: GIVENS CAPSULE STUDY;  Surgeon: Stacia Glendia BRAVO, MD;  Location: [facility];  Service: Gastroenterology;  Laterality: N/A;   HARDWARE REMOVAL Right 02/04/2024   Procedure: REMOVAL, HARDWARE;  Surgeon: Harden Jerona GAILS, MD;  [facility];  Service: Orthopedics;  Laterality: Right;  HARDWARE REMOVAL RIGHT ANKLE   HEMOSTASIS CLIP PLACEMENT  05/08/2022   Procedure: HEMOSTASIS CLIP PLACEMENT;  Surgeon: Stacia Glendia BRAVO, MD;  Location: [facility];  Service: Gastroenterology;;   HEMOSTASIS CLIP PLACEMENT  05/11/2022   Procedure: HEMOSTASIS CLIP PLACEMENT;  Surgeon: Stacia Glendia BRAVO, MD;  Location: [facility];  Service: Gastroenterology;;   HEMOSTASIS CLIP PLACEMENT  03/01/2024   Procedure: CONTROL OF HEMORRHAGE, GI TRACT, ENDOSCOPIC, BY CLIPPING OR OVERSEWING;  Surgeon: Leigh Elspeth SQUIBB, MD;  Location: [facility];  Service: Gastroenterology;;   HOT HEMOSTASIS N/A 06/13/2022   Procedure: HOT HEMOSTASIS (ARGON PLASMA COAGULATION/BICAP);  Surgeon: Wilhelmenia Aloha Raddle., MD;  Location: [facility];  Service: Gastroenterology;  Laterality: N/A;   left kidney removed     ORIF ANKLE FRACTURE Right 01/06/2024   Procedure: OPEN REDUCTION INTERNAL FIXATION (ORIF) ANKLE FRACTURE;  Surgeon: Beverley Evalene BIRCH, MD;  Location: [facility];  Service: Orthopedics;  Laterality: Right;   POLYPECTOMY  05/08/2022   Procedure: POLYPECTOMY;  Surgeon: Stacia Glendia BRAVO, MD;  Location: [facility];  Service: Gastroenterology;;   UPPER ESOPHAGEAL ENDOSCOPIC ULTRASOUND (EUS) N/A 08/02/2021   Procedure: UPPER ESOPHAGEAL ENDOSCOPIC ULTRASOUND (EUS);  Surgeon: Teressa Toribio SQUIBB, MD;  Location: [facility];  Service: Endoscopy;   Laterality: N/A;  Radial and Linear   Social History   Socioeconomic History   Marital status: Single    Spouse name: Not on file   Number of children: Not on file   Years of education: Not on file   Highest education level: Not on file  Occupational History   Not on file  Tobacco Use   Smoking status: Former    Current packs/day: 0.00    Average packs/day: 0.1 packs/day for 25.0 years (2.5 ttl pk-yrs)    Types: Cigarettes    Start date: 77    Quit date: 2019    Years since quitting: 6.8    Passive exposure: Past   Smokeless tobacco: Never   Tobacco comments:    Former smoker 08/08/22  Vaping Use   Vaping status: Never Used  Substance and Sexual Activity   Alcohol use: Not Currently   Drug use: Not Currently   Sexual activity: Not Currently  Other Topics Concern   Not on file  Social History Narrative   Not on file   Social Drivers of Health   Financial Resource Strain: High Risk (04/11/2023)   Overall Financial Resource Strain (CARDIA)    Difficulty of Paying Living Expenses: Hard  Food Insecurity: Food Insecurity Present (04/24/2024)   Hunger Vital Sign    Worried About Running Out of Food in the Last Year: Often true    Ran Out of Food in the Last Year: Often true  Transportation Needs: Unmet Transportation Needs (04/24/2024)   PRAPARE - Administrator, Civil Service (Medical): Yes    Lack of  Transportation (Non-Medical): Yes  Physical Activity: Patient Declined (05/13/2023)   Exercise Vital Sign    Days of Exercise per Week: Patient declined    Minutes of Exercise per Session: Patient declined  Stress: No Stress Concern Present (05/13/2023)   [employer] of Occupational Health - Occupational Stress Questionnaire    Feeling of Stress : Not at all  Social Connections: Socially Isolated (05/13/2023)   Social Connection and Isolation Panel    Frequency of Communication with Friends and Family: Once a week    Frequency of Social Gatherings with  Friends and Family: Once a week    Attends Religious Services: Never    Database administrator or Organizations: No    Attends Engineer, structural: Never    Marital Status: Never married   Family History  Problem Relation Age of Onset   Diabetes Mellitus II Mother    Colon cancer Neg Hx    Esophageal cancer Neg Hx    Inflammatory bowel disease Neg Hx    Liver disease Neg Hx    Pancreatic cancer Neg Hx    Rectal cancer Neg Hx    Stomach cancer Neg Hx    - negative except otherwise stated in the family history section Allergies  Allergen Reactions   Iohexol  Other (See Comments)    Unknown reaction at 71 days old Mom at bedside reported patient was given injections of iohexol  in foot and resulted in hole in foot or foot got infected   Nsaids Other (See Comments)    Patient said he was told he could take Tylenol  ONLY.   Prior to Admission medications   Medication Sig Start Date End Date Taking? Authorizing Provider  apixaban  (ELIQUIS ) 2.5 MG TABS tablet Take 1 tablet (2.5 mg total) by mouth 2 (two) times daily for 28 days. 01/08/24 07/07/24 Yes Drusilla Sabas RAMAN, MD  acetaminophen  (TYLENOL ) 500 MG tablet Take 1,500 mg by mouth daily as needed for moderate pain (pain score 4-6), fever or headache.    [provider]  atorvastatin  (LIPITOR) 20 MG tablet Take 1 tablet (20 mg total) by mouth daily. 04/02/23   Laurence Locus, DO  Continuous Glucose Sensor (FREESTYLE LIBRE 3 PLUS SENSOR) MISC Change sensor every 15 days. Use to check blood sugar continuously. 12/15/23   Newlin, Enobong, MD  empagliflozin  (JARDIANCE ) 10 MG TABS tablet Take 1 tablet (10 mg total) by mouth daily before breakfast. 11/17/23   Newlin, Enobong, MD  folic acid  (FOLVITE ) 1 MG tablet Take 1 tablet (1 mg total) by mouth daily. 03/05/24   Amin, Ankit C, MD  ibuprofen  (ADVIL ) 200 MG tablet Take 400 mg by mouth every 6 (six) hours as needed for mild pain (pain score 1-3) or moderate pain (pain score 4-6).     [provider]  insulin  glargine (LANTUS  SOLOSTAR) 100 UNIT/ML Solostar Pen Inject 32 Units into the skin daily. 03/22/24   Newlin, Enobong, MD  insulin  lispro (HUMALOG  KWIKPEN) 100 UNIT/ML KwikPen Inject 10 Units into the skin 3 (three) times daily. 01/18/24   Trine Raynell Moder, MD  lactulose  (CHRONULAC ) 10 GM/15ML solution Take 45 mLs (30 g total) by mouth as needed for mild constipation. 12/08/23   Beather Delon Gibson, PA  levETIRAcetam  (KEPPRA ) 1000 MG tablet Take 1 tablet (1,000 mg total) by mouth 2 (two) times daily. 03/04/24   Amin, Ankit C, MD  Menthol-Methyl Salicylate (MUSCLE RUB) 10-15 % CREA Apply 1 Application topically as needed for muscle pain (arm pain).  [provider]  metFORMIN  (GLUCOPHAGE ) 500 MG tablet Take 2 tablets (1,000 mg total) by mouth 2 (two) times daily with a meal. 07/11/23   Delbert Clam, MD  metoprolol  succinate (TOPROL -XL) 25 MG 24 hr tablet Take 0.5 tablets (12.5 mg total) by mouth daily. 01/08/24   Drusilla Sabas RAMAN, MD  Multiple Vitamin (MULTIVITAMIN WITH MINERALS) TABS tablet Take 1 tablet by mouth daily. 03/04/24   Amin, Ankit C, MD  pantoprazole  (PROTONIX ) 40 MG tablet Take 1 tablet (40 mg total) by mouth 2 (two) times daily before a meal for 60 days, THEN 1 tablet (40 mg total) daily before breakfast. 03/04/24 06/02/24  Amin, Ankit C, MD  thiamine  (VITAMIN B1) 100 MG tablet Take 1 tablet (100 mg total) by mouth daily. 03/05/24   Caleen Burgess BROCKS, MD   DG Ankle Complete Right Result Date: 04/23/2024 CLINICAL DATA:  Pain, open wounds, drainage EXAM: RIGHT ANKLE - COMPLETE 3+ VIEW COMPARISON:  02/28/2024 FINDINGS: Frontal, oblique, lateral views of the right ankle are obtained. 2 cannulated screws are again seen traversing the medial malleolus, with increased lucency surrounding the screws consistent with infection and/or loosening. There is further distraction of the medial and lateral malleolar fractures, with increased angulation at the lateral  malleolar fracture site. Bony resorption is seen along the tibial plafond lateral aspect as well as along the talar dome. Findings are concerning for underlying osteomyelitis and septic arthritis. Increased callus formation along the lateral malleolar fracture site. Marked increased soft tissue swelling. No subcutaneous gas or radiopaque foreign body. IMPRESSION: 1. Increased soft tissue swelling, with findings consistent with progressive osteomyelitis and septic arthritis of the right ankle. Electronically Signed   By: Ozell Daring M.D.   On: 04/23/2024 21:43   - pertinent xrays, CT, MRI studies were reviewed and independently interpreted  Positive ROS: All other systems have been reviewed and were otherwise negative with the exception of those mentioned in the HPI and as above.  Physical Exam: General: Alert, no acute distress Psychiatric: Patient is competent for consent with normal mood and affect Lymphatic: No axillary or cervical lymphadenopathy Cardiovascular: No pedal edema Respiratory: No cyanosis, no use of accessory musculature GI: No organomegaly, abdomen is soft and non-tender    Images:  @ENCIMAGES @  Labs:  Lab Results  Component Value Date   HGBA1C 9.6 (A) 03/22/2024   HGBA1C 7.1 (H) 12/30/2023   HGBA1C 8.6 (A) 10/14/2023   ESRSEDRATE 25 (H) 04/24/2024   ESRSEDRATE 119 (H) 03/25/2024   CRP 0.5 04/24/2024   CRP 7.1 03/25/2024   CRP 1.4 (H) 07/09/2020   REPTSTATUS 03/05/2024 FINAL 02/29/2024   GRAMSTAIN NO WBC SEEN NO ORGANISMS SEEN  02/04/2024   CULT  02/29/2024    NO GROWTH 5 DAYS Performed at [facility], [address], [place], [place] [address]    LABORGA METHICILLIN RESISTANT STAPHYLOCOCCUS AUREUS 02/04/2024   LABORGA ENTEROCOCCUS GALLINARUM 02/04/2024    Lab Results  Component Value Date   ALBUMIN 2.1 (L) 04/24/2024   ALBUMIN 3.5 04/23/2024   ALBUMIN 2.2 (L) 03/03/2024   PREALBUMIN 14 (L) 05/06/2022        Latest Ref Rng & Units  04/24/2024   11:34 AM 04/24/2024    7:32 AM 04/23/2024    9:22 PM  CBC EXTENDED  WBC 4.0 - 10.5 K/uL  4.5  6.2   RBC 4.22 - 5.81 MIL/uL  3.05  3.81   Hemoglobin 13.0 - 17.0 g/dL 7.8  7.8  9.3   HCT  39.0 - 52.0 % 24.7  24.4  31.7   Platelets 150 - 400 K/uL  121  208   NEUT# 1.7 - 7.7 K/uL  2.5    Lymph# 0.7 - 4.0 K/uL  1.3      Neurologic: Patient does not have protective sensation bilateral lower extremities.   MUSCULOSKELETAL:   Skin: Examination patient has open wounds around the ankle with swelling.  There is drainage the ulcers extend down to bone.  Hemoglobin 7.8 with albumin of 2.1 and a hemoglobin A1c of 9.6.  Assessment: Assessment: Chronic osteomyelitis right ankle with draining ulceration.  Plan: Plan: Patient is emphatic that he will not consider amputation surgery.  [facility]  I will place an order for a fracture boot.  Patient may be weightbearing as tolerated.  Thank you for the consult and the opportunity to see Mr. Neysa Jerona Sage, MD [facility] [phone] 3:20 PM

## 2024-04-24 NOTE — Progress Notes (Signed)
 Pharmacy Antibiotic Note  Gary Mitchell is a 53 y.o. male admitted on 04/23/2024 with osteomyelitis .  Pharmacy has been consulted for vancomycin  dosing.  Plan: Vancomycin  1750mg  IV q24h (AUC 491.6, Scr  used 0.8) Follow renal function and clinical course  Weight: 60.4 kg (133 lb 2.5 oz)  Temp (24hrs), Avg:98 F (36.7 C), Min:98 F (36.7 C), Max:98 F (36.7 C)  Recent Labs  Lab 04/23/24 2122  WBC 6.2  CREATININE 0.52*    Estimated Creatinine Clearance: 93.3 mL/min (A) (by C-G formula based on SCr of 0.52 mg/dL (L)).    Allergies  Allergen Reactions   Iohexol  Other (See Comments)    Unknown reaction at 37 days old Mom at bedside reported patient was given injections of iohexol  in foot and resulted in hole in foot or foot got infected   Nsaids Other (See Comments)    Patient said he was told he could take Tylenol  ONLY.    Antimicrobials this admission: 10/18 unasyn  x 1 10/18 vanc >> 10/18 cefepime >>  Dose adjustments this admission:   Microbiology results:   Thank you for allowing pharmacy to be a part of this patient's care.  Leeroy Mace RPh 04/24/2024, 1:12 AM

## 2024-04-24 NOTE — Consult Note (Signed)
 Reason for Consult:Right ankle infection  Referring Physician: Jamauri Kruzel is an 53 y.o. male.  HPI: 53 year old male  well known to Dr. Harden admitted through the ER with open wounds right ankle. Patient with known osteomyelitis right.  Initially had ankle fracture underwent with surgery and developed infection.  Hardware removed he is treated with antibiotics and despite this continues to have advancing osteomyelitis.  He was seen by Dr. Irean to in July he recommended BKA.  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  States he has chills but no fevers.  No nausea or vomiting.  Continued right ankle pain.  Evidently was [place] and family disturbance police were called patient was felt to be intoxicated.  He is awaiting transfer to jail however this is being deferred due to his ongoing ankle infection.  Radiographs reviewed from yesterday and show progression of osteomyelitis lab in the right ankle.  Bone agree observe option of the tibial plafond laterally as well as the talar dome.  Lateral malleolus also with further reabsorption of bone secondary to osteomyelitis.  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   Seizures (HCC)    Seizures (HCC)    Thrombocytopenia     Past Surgical History:  Procedure Laterality Date   BIOPSY  08/02/2021   Procedure:  BIOPSY;  Surgeon: Teressa Toribio SQUIBB, MD;  Location: [facility];  Service: Endoscopy;;   BIOPSY  05/07/2022   Procedure: BIOPSY;  Surgeon: Stacia Glendia BRAVO, MD;  Location: [facility];  Service: Gastroenterology;;   BONE BIOPSY  03/01/2024   Procedure: BIOPSY, GI;  Surgeon: Leigh Elspeth SQUIBB, MD;  Location: [facility];  Service: Gastroenterology;;   COLONOSCOPY WITH PROPOFOL  N/A 05/08/2022   Procedure: COLONOSCOPY WITH PROPOFOL ;  Surgeon: Stacia Glendia BRAVO, MD;  Location: [facility];  Service: Gastroenterology;  Laterality: N/A;   ENTEROSCOPY N/A 09/20/2020   Procedure: ENTEROSCOPY;  Surgeon: Eda Iha, MD;  Location[facility]COPY;  Service: Gastroenterology;  Laterality: N/A;   ENTEROSCOPY N/A 05/11/2022   Procedure: ENTEROSCOPY;  Surgeon: Stacia Glendia BRAVO, MD;  Location: [facility];  Service: Gastroenterology;  Laterality: N/A;   ESOPHAGOGASTRODUODENOSCOPY N/A 03/01/2024   Procedure: EGD (ESOPHAGOGASTRODUODENOSCOPY);  Surgeon: Leigh Elspeth SQUIBB, MD;  Location: [facility] ENDOSCOPY;  Service: Gastroenterology;  Laterality: N/A;   ESOPHAGOGASTRODUODENOSCOPY (EGD) WITH PROPOFOL  N/A 08/02/2021   Procedure: ESOPHAGOGASTRODUODENOSCOPY (EGD) WITH PROPOFOL ;  Surgeon: Teressa Toribio SQUIBB, MD;  Location: [facility];  Service: Endoscopy;  Laterality: N/A;   ESOPHAGOGASTRODUODENOSCOPY (EGD) WITH PROPOFOL  N/A 01/31/2022   Procedure: ESOPHAGOGASTRODUODENOSCOPY (EGD) WITH PROPOFOL ;  Surgeon: Teressa Toribio SQUIBB, MD;  Location: [facility];  Service: Gastroenterology;  Laterality: N/A;   ESOPHAGOGASTRODUODENOSCOPY (EGD) WITH PROPOFOL  N/A 05/07/2022   Procedure: ESOPHAGOGASTRODUODENOSCOPY (EGD) WITH PROPOFOL ;  Surgeon: Stacia,  Glendia BRAVO, MD;  Location: [facility];  Service: Gastroenterology;  Laterality: N/A;   ESOPHAGOGASTRODUODENOSCOPY (EGD) WITH PROPOFOL  N/A 06/13/2022   Procedure: ESOPHAGOGASTRODUODENOSCOPY (EGD) WITH PROPOFOL ;  Surgeon: Wilhelmenia Aloha Raddle., MD;  Location: [facility];   Service: Gastroenterology;  Laterality: N/A;   EUS N/A 01/31/2022   Procedure: UPPER ENDOSCOPIC ULTRASOUND (EUS) RADIAL;  Surgeon: Teressa Toribio SQUIBB, MD;  Location: [facility];  Service: Gastroenterology;  Laterality: N/A;   EUS N/A 06/13/2022   Procedure: UPPER ENDOSCOPIC ULTRASOUND (EUS) LINEAR;  Surgeon: Wilhelmenia Aloha Raddle., MD;  Location: [facility];  Service: Gastroenterology;  Laterality: N/A;   FINE NEEDLE ASPIRATION N/A 08/02/2021   Procedure: FINE NEEDLE ASPIRATION (FNA) LINEAR;  Surgeon: Teressa Toribio SQUIBB, MD;  Location: [facility];  Service: Endoscopy;  Laterality: N/A;   FINE NEEDLE ASPIRATION N/A 01/31/2022   Procedure: FINE NEEDLE ASPIRATION (FNA) LINEAR;  Surgeon: Teressa Toribio SQUIBB, MD;  Location: [facility];  Service: Gastroenterology;  Laterality: N/A;   FINE NEEDLE ASPIRATION N/A 06/13/2022   Procedure: FINE NEEDLE ASPIRATION (FNA) LINEAR;  Surgeon: Wilhelmenia Aloha Raddle., MD;  Location: [facility];  Service: Gastroenterology;  Laterality: N/A;   GIVENS CAPSULE STUDY N/A 05/09/2022   Procedure: GIVENS CAPSULE STUDY;  Surgeon: Stacia Glendia BRAVO, MD;  Location: [facility];  Service: Gastroenterology;  Laterality: N/A;   HARDWARE REMOVAL Right 02/04/2024   Procedure: REMOVAL, HARDWARE;  Surgeon: Harden Jerona GAILS, MD;  Location: [facility] OR;  Service: Orthopedics;  Laterality: Right;  HARDWARE REMOVAL RIGHT ANKLE   HEMOSTASIS CLIP PLACEMENT  05/08/2022   Procedure: HEMOSTASIS CLIP PLACEMENT;  Surgeon: Stacia Glendia BRAVO, MD;  Location: [facility];  Service: Gastroenterology;;   HEMOSTASIS CLIP PLACEMENT  05/11/2022   Procedure: HEMOSTASIS CLIP PLACEMENT;  Surgeon: Stacia Glendia BRAVO, MD;  Location: [facility];  Service: Gastroenterology;;   HEMOSTASIS CLIP PLACEMENT  03/01/2024   Procedure: CONTROL OF HEMORRHAGE, GI TRACT, ENDOSCOPIC, BY CLIPPING OR OVERSEWING;  Surgeon: Leigh Elspeth SQUIBB, MD;  Location: [facility];  Service: Gastroenterology;;   HOT HEMOSTASIS N/A  06/13/2022   Procedure: HOT HEMOSTASIS (ARGON PLASMA COAGULATION/BICAP);  Surgeon: Wilhelmenia Aloha Raddle., MD;  Location: [facility];  Service: Gastroenterology;  Laterality: N/A;   left kidney removed     ORIF ANKLE FRACTURE Right 01/06/2024   Procedure: OPEN REDUCTION INTERNAL FIXATION (ORIF) ANKLE FRACTURE;  Surgeon: Beverley Evalene BIRCH, MD;  Location: [facility];  Service: Orthopedics;  Laterality: Right;   POLYPECTOMY  05/08/2022   Procedure: POLYPECTOMY;  Surgeon: Stacia Glendia BRAVO, MD;  Location: [facility];  Service: Gastroenterology;;   UPPER ESOPHAGEAL ENDOSCOPIC ULTRASOUND (EUS) N/A 08/02/2021   Procedure: UPPER ESOPHAGEAL ENDOSCOPIC ULTRASOUND (EUS);  Surgeon: Teressa Toribio SQUIBB, MD;  Location: [facility];  Service: Endoscopy;  Laterality: N/A;  Radial and Linear    Family History  Problem Relation Age of Onset   Diabetes Mellitus II Mother    Colon cancer Neg Hx    Esophageal cancer Neg Hx    Inflammatory bowel disease Neg Hx    Liver disease Neg Hx    Pancreatic cancer Neg Hx    Rectal cancer Neg Hx    Stomach cancer Neg Hx     Social History:  reports that he quit smoking about 6 years ago. His smoking use included cigarettes. He started smoking about 31 years ago. He has a 2.5 pack-year smoking history. He has been exposed to tobacco smoke. He has never used smokeless tobacco. He reports that he does not currently use alcohol. He reports that he  does not currently use drugs.  Allergies:  Allergies  Allergen Reactions   Iohexol  Other (See Comments)    Unknown reaction at 67 days old Mom at bedside reported patient was given injections of iohexol  in foot and resulted in hole in foot or foot got infected   Nsaids Other (See Comments)    Patient said he was told he could take Tylenol  ONLY.    Medications: I have reviewed the patient's current medications.  Results for orders placed or performed during the hospital encounter of 04/23/24 (from the past 48 hours)  CBC      Status: Abnormal   Collection Time: 04/23/24  9:22 PM  Result Value Ref Range   WBC 6.2 4.0 - 10.5 K/uL   RBC 3.81 (L) 4.22 - 5.81 MIL/uL   Hemoglobin 9.3 (L) 13.0 - 17.0 g/dL   HCT 68.2 (L) 60.9 - 47.9 %   MCV 83.2 80.0 - 100.0 fL   MCH 24.4 (L) 26.0 - 34.0 pg   MCHC 29.3 (L) 30.0 - 36.0 g/dL   RDW 78.2 (H) 88.4 - 84.4 %   Platelets 208 150 - 400 K/uL   nRBC 0.0 0.0 - 0.2 %    Comment: Performed at [facility], [address] [place], [place] [address]  Comprehensive metabolic panel     Status: Abnormal   Collection Time: 04/23/24  9:22 PM  Result Value Ref Range   Sodium 143 135 - 145 mmol/L   Potassium 3.8 3.5 - 5.1 mmol/L   Chloride 103 98 - 111 mmol/L   CO2 24 22 - 32 mmol/L   Glucose, Bld 290 (H) 70 - 99 mg/dL    Comment: Glucose reference range applies only to samples taken after fasting for at least 8 hours.   BUN 8 6 - 20 mg/dL   Creatinine, Ser 9.47 (L) 0.61 - 1.24 mg/dL   Calcium  9.5 8.9 - 10.3 mg/dL   Total Protein 9.0 (H) 6.5 - 8.1 g/dL   Albumin 3.5 3.5 - 5.0 g/dL   AST 49 (H) 15 - 41 U/L   ALT 19 0 - 44 U/L   Alkaline Phosphatase 352 (H) 38 - 126 U/L   Total Bilirubin 1.8 (H) 0.0 - 1.2 mg/dL   GFR, Estimated >39 >39 mL/min    Comment: (NOTE) Calculated using the CKD-EPI Creatinine Equation (2021)    Anion gap 16 (H) 5 - 15    Comment: Performed at [facility], [address], [place], [place] [address]  Ethanol     Status: Abnormal   Collection Time: 04/23/24  9:22 PM  Result Value Ref Range   Alcohol, Ethyl (B) 255 (H) <15 mg/dL    Comment: (NOTE) For medical purposes only. Performed at [facility], [address] [place], [place] [address]   CBG monitoring, ED     Status: Abnormal   Collection Time: 04/24/24  1:35 AM  Result Value Ref Range   Glucose-Capillary 225 (H) 70 - 99 mg/dL    Comment: Glucose reference range applies only to samples taken after fasting for at least 8 hours.   Glucose, capillary     Status: Abnormal   Collection Time: 04/24/24  4:35 AM  Result Value Ref Range   Glucose-Capillary 190 (H) 70 - 99 mg/dL    Comment: Glucose reference range applies only to samples taken after fasting for at least 8 hours.  Glucose, capillary     Status: Abnormal   Collection  Time: 04/24/24  7:30 AM  Result Value Ref Range   Glucose-Capillary 244 (H) 70 - 99 mg/dL    Comment: Glucose reference range applies only to samples taken after fasting for at least 8 hours.  CBC with Differential/Platelet     Status: Abnormal   Collection Time: 04/24/24  7:32 AM  Result Value Ref Range   WBC 4.5 4.0 - 10.5 K/uL   RBC 3.05 (L) 4.22 - 5.81 MIL/uL   Hemoglobin 7.8 (L) 13.0 - 17.0 g/dL   HCT 75.5 (L) 60.9 - 47.9 %   MCV 80.0 80.0 - 100.0 fL   MCH 25.6 (L) 26.0 - 34.0 pg   MCHC 32.0 30.0 - 36.0 g/dL   RDW 78.9 (H) 88.4 - 84.4 %   Platelets 121 (L) 150 - 400 K/uL   nRBC 0.0 0.0 - 0.2 %   Neutrophils Relative % 56 %   Neutro Abs 2.5 1.7 - 7.7 K/uL   Lymphocytes Relative 30 %   Lymphs Abs 1.3 0.7 - 4.0 K/uL   Monocytes Relative 12 %   Monocytes Absolute 0.6 0.1 - 1.0 K/uL   Eosinophils Relative 1 %   Eosinophils Absolute 0.1 0.0 - 0.5 K/uL   Basophils Relative 1 %   Basophils Absolute 0.1 0.0 - 0.1 K/uL   Immature Granulocytes 0 %   Abs Immature Granulocytes 0.01 0.00 - 0.07 K/uL    Comment: Performed at [facility], [address] [place], [place] [address]  Comprehensive metabolic panel with GFR     Status: Abnormal   Collection Time: 04/24/24  7:32 AM  Result Value Ref Range   Sodium 136 135 - 145 mmol/L   Potassium 3.6 3.5 - 5.1 mmol/L   Chloride 99 98 - 111 mmol/L   CO2 23 22 - 32 mmol/L   Glucose, Bld 256 (H) 70 - 99 mg/dL    Comment: Glucose reference range applies only to samples taken after fasting for at least 8 hours.   BUN 5 (L) 6 - 20 mg/dL   Creatinine, Ser 9.57 (L) 0.61 - 1.24 mg/dL   Calcium  8.2 (L) 8.9 - 10.3 mg/dL   Total Protein 6.9 6.5  - 8.1 g/dL   Albumin 2.1 (L) 3.5 - 5.0 g/dL   AST 35 15 - 41 U/L   ALT 14 0 - 44 U/L   Alkaline Phosphatase 219 (H) 38 - 126 U/L   Total Bilirubin 2.0 (H) 0.0 - 1.2 mg/dL   GFR, Estimated >39 >39 mL/min    Comment: (NOTE) Calculated using the CKD-EPI Creatinine Equation (2021)    Anion gap 14 5 - 15    Comment: Performed at [facility], [address] [place], [place] [address]  Magnesium      Status: Abnormal   Collection Time: 04/24/24  7:32 AM  Result Value Ref Range   Magnesium  0.9 (LL) 1.7 - 2.4 mg/dL    Comment: 1ST ATTEMPT TO CALL 0815 04/24/2024 CRITICAL RESULT CALLED TO, READ BACK BY AND VERIFIED WITH RONAL LOUDER RN @0831  04/24/24 MAB Performed at [facility], [address], [place], [place] [address]   C-reactive protein     Status: None   Collection Time: 04/24/24  7:32 AM  Result Value Ref Range   CRP 0.5 <1.0 mg/dL    Comment: Performed at [facility], [address] [place], [place] [address]  Sedimentation rate     Status: Abnormal   Collection Time: 04/24/24  7:32 AM  Result Value Ref Range   Sed Rate 25 (H) 0 - 16 mm/hr    Comment: Performed at [facility], [address] [place], [place] [address]    DG Ankle Complete Right Result Date: 04/23/2024 CLINICAL DATA:  Pain, open wounds, drainage EXAM: RIGHT ANKLE - COMPLETE 3+ VIEW COMPARISON:  02/28/2024 FINDINGS: Frontal, oblique, lateral views of the right ankle are obtained. 2 cannulated screws are again seen traversing the medial malleolus, with increased lucency surrounding the screws consistent with infection and/or loosening. There is further distraction of the medial and lateral malleolar fractures, with increased angulation at the lateral malleolar fracture site. Bony resorption is seen along the tibial plafond lateral aspect as well as along the talar dome. Findings are concerning for underlying osteomyelitis and septic arthritis. Increased callus formation along the lateral  malleolar fracture site. Marked increased soft tissue swelling. No subcutaneous gas or radiopaque foreign body. IMPRESSION: 1. Increased soft tissue swelling, with findings consistent with progressive osteomyelitis and septic arthritis of the right ankle. Electronically Signed   By: Ozell Daring M.D.   On: 04/23/2024 21:43    Review of Systems Blood pressure 128/77, pulse (!) 105, temperature 98.8 F (37.1 C), resp. rate 18, height 5' 4 (1.626 m), weight 61.7 kg, SpO2 100%. Physical Exam Constitutional:      Appearance: He is normal weight. He is not ill-appearing.  Cardiovascular:     Pulses: Normal pulses.  Pulmonary:     Effort: Pulmonary effort is normal.  Neurological:     Mental Status: He is alert.  Psychiatric:        Thought Content: Thought content normal.    Right ankle/ foot : Medial and lateral granular ulcerations with serous drainage . No malodor or purulence. Senstion subjectively intact through out the foot. Mild edema right foot and ankle without erythema. Calf supple non- tender. Dorsi plantar flexion right ankle inatct.    Assessment/Plan: Despite patient's evidence of worsening osteomyelitis on radiograph.  Patient does not appear septic.  Will have Dr. Harden evaluate patient on his return on Monday.  New dressing applied.  Recommend elevation and offloading of the right heel. Touch down weight bearing right lower extremity for balance.  Continue antibiotics ( vancomycin  and maxipime)     Hellena Pridgen 04/24/2024, 9:45 AM

## 2024-04-24 NOTE — Consult Note (Addendum)
 WOC Nurse has reviewed record and this patient has a positive xray or MRI for osteomyelitis, this is considered outside of the scope of practice for the WOC nurse, for that reason WOC Nurse will not consult.  I will add topical care only, will need further workup for ankle wounds per orthopedics. Has been followed by Dr. Harden in the past.   Wound type: chronic full thickness wounds right medial and lateral malleolus in the presence of chronic osteomylitis  Pressure Injury POA: NA Measurement:see nursing flow sheets Wound bed: hypergranulation  Drainage (amount, consistency, odor) per ED notes malodorous, see nursing flow sheets Periwound: intact  Dressing procedure/placement/frequency: Cleanse right ankle wound with Vashe [name] # [id number]), apply Vashe moist gauze dressing, top with dry dressing and kerlix. Change daily     Gary Mitchell [facility] MSN, RN, [place], CNS, MAINE [phone]

## 2024-04-24 NOTE — H&P (Signed)
 History and Physical    Patient: Gary Mitchell FMW:[id number] DOB: 1971/06/15 DOA: 04/23/2024 DOS: the patient was seen and examined on 04/24/2024 PCP: Delbert Clam, MD  Patient coming from: Home  Chief Complaint:  Chief Complaint  Patient presents with   Wound Check   HPI: Gary Mitchell is a 53 y.o. male with medical history significant of chronic osteomyelitis of the right ankle, chronic alcohol abuse, poorly controlled type 2 diabetes mellitus, with most recent hemoglobin A1c 9.6% in September 2025, who is being admitted to [facility] with worsening osteomyelitis of the right ankle after presenting to [facility] emergency department for evaluation of right ankle swelling, erythema.    The patient has a history of chronic osteomyelitis involving the right ankle, and has been following with orthopedic surgery as well as infectious disease as an outpatient for this.  Orthopedic surgery is reportedly discussed with patient the possibility of subsequently requiring amputation procedure for his chronic right ankle osteomyelitis.  The patient preferred to try conservative measures, and is separately been on antibiotics for this, but is experienced worsening erythema, increased swelling involving the right hand, in spite of interval antibiotics.   In the ED this evening, he underwent plain films of the right ankle, which show interval worsening and associated osteomyelitis.    Per presenting CMP, glucose elevated to 298.  He also has a history of chronic alcohol abuse, and is noted to have a serum ethanol level of 255  Review of Systems: As mentioned in the history of present illness. All other systems reviewed and are negative. 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   Seizures (HCC)    Seizures (HCC)    Thrombocytopenia    Past Surgical History:  Procedure Laterality Date   BIOPSY  08/02/2021   Procedure: BIOPSY;  Surgeon: Teressa Toribio SQUIBB, MD;  Location: [facility];  Service: Endoscopy;;   BIOPSY  05/07/2022   Procedure: BIOPSY;  Surgeon: Stacia Glendia BRAVO, MD;  Location: [facility];  Service: Gastroenterology;;   BONE BIOPSY  03/01/2024   Procedure: BIOPSY, GI;  Surgeon: Leigh Elspeth SQUIBB, MD;  Location: [facility] ENDOSCOPY;  Service: Gastroenterology;;   COLONOSCOPY WITH PROPOFOL  N/A 05/08/2022   Procedure: COLONOSCOPY WITH PROPOFOL ;  Surgeon: Stacia Glendia BRAVO, MD;  Location: [facility];  Service: Gastroenterology;  Laterality: N/A;   ENTEROSCOPY N/A 09/20/2020   Procedure: ENTEROSCOPY;  Surgeon: Eda Iha, MD;  Location: [facility] ENDOSCOPY;  Service: Gastroenterology;  Laterality: N/A;   ENTEROSCOPY N/A 05/11/2022   Procedure: ENTEROSCOPY;  Surgeon: Stacia Glendia BRAVO, MD;  Location: [facility];  Service: Gastroenterology;  Laterality: N/A;   ESOPHAGOGASTRODUODENOSCOPY N/A 03/01/2024   Procedure: EGD (ESOPHAGOGASTRODUODENOSCOPY);  Surgeon: Leigh Elspeth SQUIBB, MD;  Location: [facility] ENDOSCOPY;  Service: Gastroenterology;  Laterality: N/A;   ESOPHAGOGASTRODUODENOSCOPY (EGD) WITH PROPOFOL  N/A  08/02/2021   Procedure: ESOPHAGOGASTRODUODENOSCOPY (EGD) WITH PROPOFOL ;  Surgeon: Teressa Toribio SQUIBB, MD;  Location: [facility];  Service: Endoscopy;  Laterality: N/A;   ESOPHAGOGASTRODUODENOSCOPY (EGD) WITH PROPOFOL  N/A 01/31/2022   Procedure: ESOPHAGOGASTRODUODENOSCOPY (EGD) WITH  PROPOFOL ;  Surgeon: Teressa Toribio SQUIBB, MD;  Location: [facility];  Service: Gastroenterology;  Laterality: N/A;   ESOPHAGOGASTRODUODENOSCOPY (EGD) WITH PROPOFOL  N/A 05/07/2022   Procedure: ESOPHAGOGASTRODUODENOSCOPY (EGD) WITH PROPOFOL ;  Surgeon: Stacia Glendia BRAVO, MD;  [facility];  Service: Gastroenterology;  Laterality: N/A;   ESOPHAGOGASTRODUODENOSCOPY (EGD) WITH PROPOFOL  N/A 06/13/2022   Procedure: ESOPHAGOGASTRODUODENOSCOPY (EGD) WITH PROPOFOL ;  Surgeon: Wilhelmenia Aloha Raddle., MD;  Location: [facility];  Service: Gastroenterology;  Laterality: N/A;   EUS N/A 01/31/2022   Procedure: UPPER ENDOSCOPIC ULTRASOUND (EUS) RADIAL;  Surgeon: Teressa Toribio SQUIBB, MD;  Location: [facility];  Service: Gastroenterology;  Laterality: N/A;   EUS N/A 06/13/2022   Procedure: UPPER ENDOSCOPIC ULTRASOUND (EUS) LINEAR;  Surgeon: Wilhelmenia Aloha Raddle., MD;  Location: [facility];  Service: Gastroenterology;  Laterality: N/A;   FINE NEEDLE ASPIRATION N/A 08/02/2021   Procedure: FINE NEEDLE ASPIRATION (FNA) LINEAR;  Surgeon: Teressa Toribio SQUIBB, MD;  Location: [facility];  Service: Endoscopy;  Laterality: N/A;   FINE NEEDLE ASPIRATION N/A 01/31/2022   Procedure: FINE NEEDLE ASPIRATION (FNA) LINEAR;  Surgeon: Teressa Toribio SQUIBB, MD;  Location: [facility];  Service: Gastroenterology;  Laterality: N/A;   FINE NEEDLE ASPIRATION N/A 06/13/2022   Procedure: FINE NEEDLE ASPIRATION (FNA) LINEAR;  Surgeon: Wilhelmenia Aloha Raddle., MD;  Location: [facility];  Service: Gastroenterology;  Laterality: N/A;   GIVENS CAPSULE STUDY N/A 05/09/2022   Procedure: GIVENS CAPSULE STUDY;  Surgeon: Stacia Glendia BRAVO, MD;  Location: [facility];  Service: Gastroenterology;  Laterality: N/A;   HARDWARE REMOVAL Right 02/04/2024   Procedure: REMOVAL, HARDWARE;  Surgeon: Harden Jerona GAILS, MD;  Location: [facility] OR;  Service: Orthopedics;  Laterality: Right;  HARDWARE REMOVAL RIGHT ANKLE   HEMOSTASIS CLIP PLACEMENT  05/08/2022    Procedure: HEMOSTASIS CLIP PLACEMENT;  Surgeon: Stacia Glendia BRAVO, MD;  Location: [facility];  Service: Gastroenterology;;   HEMOSTASIS CLIP PLACEMENT  05/11/2022   Procedure: HEMOSTASIS CLIP PLACEMENT;  Surgeon: Stacia Glendia BRAVO, MD;  Location: [facility];  Service: Gastroenterology;;   HEMOSTASIS CLIP PLACEMENT  03/01/2024   Procedure: CONTROL OF HEMORRHAGE, GI TRACT, ENDOSCOPIC, BY CLIPPING OR OVERSEWING;  Surgeon: Leigh Elspeth SQUIBB, MD;  Location: [facility];  Service: Gastroenterology;;   HOT HEMOSTASIS N/A 06/13/2022   Procedure: HOT HEMOSTASIS (ARGON PLASMA COAGULATION/BICAP);  Surgeon: Wilhelmenia Aloha Raddle., MD;  Location: [facility];  Service: Gastroenterology;  Laterality: N/A;   left kidney removed     ORIF ANKLE FRACTURE Right 01/06/2024   Procedure: OPEN REDUCTION INTERNAL FIXATION (ORIF) ANKLE FRACTURE;  Surgeon: Beverley Evalene BIRCH, MD;  Location: [facility];  Service: Orthopedics;  Laterality: Right;   POLYPECTOMY  05/08/2022   Procedure: POLYPECTOMY;  Surgeon: Stacia Glendia BRAVO, MD;  Location: [facility];  Service: Gastroenterology;;   UPPER ESOPHAGEAL ENDOSCOPIC ULTRASOUND (EUS) N/A 08/02/2021   Procedure: UPPER ESOPHAGEAL ENDOSCOPIC ULTRASOUND (EUS);  Surgeon: Teressa Toribio SQUIBB, MD;  Location: [facility];  Service: Endoscopy;  Laterality: N/A;  Radial and Linear   Social History:  reports that he quit smoking about 6 years ago. His smoking use included cigarettes. He started smoking about 31 years ago. He has a 2.5 pack-year smoking history. He has been exposed to tobacco smoke. He has never used smokeless tobacco. He reports that he does not currently use alcohol.  He reports that he does not currently use drugs.  Allergies  Allergen Reactions   Iohexol  Other (See Comments)    Unknown reaction at 61 days old Mom at bedside reported patient was given injections of iohexol  in foot and resulted in hole in foot or foot got infected   Nsaids Other (See Comments)     Patient said he was told he could take Tylenol  ONLY.    Family History  Problem Relation Age of Onset   Diabetes Mellitus II Mother    Colon cancer Neg Hx    Esophageal cancer Neg Hx    Inflammatory bowel disease Neg Hx    Liver disease Neg Hx    Pancreatic cancer Neg Hx    Rectal cancer Neg Hx    Stomach cancer Neg Hx     Prior to Admission medications   Medication Sig Start Date End Date Taking? Authorizing Provider  acetaminophen  (TYLENOL ) 500 MG tablet Take 1,500 mg by mouth daily as needed for moderate pain (pain score 4-6), fever or headache.    [provider]  apixaban  (ELIQUIS ) 2.5 MG TABS tablet Take 1 tablet (2.5 mg total) by mouth 2 (two) times daily for 28 days. 01/08/24 07/07/24  Drusilla Sabas RAMAN, MD  atorvastatin  (LIPITOR) 20 MG tablet Take 1 tablet (20 mg total) by mouth daily. 04/02/23   Laurence Locus, DO  Continuous Glucose Sensor (FREESTYLE LIBRE 3 PLUS SENSOR) MISC Change sensor every 15 days. Use to check blood sugar continuously. 12/15/23   Newlin, Enobong, MD  empagliflozin  (JARDIANCE ) 10 MG TABS tablet Take 1 tablet (10 mg total) by mouth daily before breakfast. 11/17/23   Newlin, Enobong, MD  folic acid  (FOLVITE ) 1 MG tablet Take 1 tablet (1 mg total) by mouth daily. 03/05/24   Amin, Ankit C, MD  ibuprofen  (ADVIL ) 200 MG tablet Take 400 mg by mouth every 6 (six) hours as needed for mild pain (pain score 1-3) or moderate pain (pain score 4-6).    [provider]  insulin  glargine (LANTUS  SOLOSTAR) 100 UNIT/ML Solostar Pen Inject 32 Units into the skin daily. 03/22/24   Newlin, Enobong, MD  insulin  lispro (HUMALOG  KWIKPEN) 100 UNIT/ML KwikPen Inject 10 Units into the skin 3 (three) times daily. 01/18/24   Trine Raynell Moder, MD  lactulose  (CHRONULAC ) 10 GM/15ML solution Take 45 mLs (30 g total) by mouth as needed for mild constipation. 12/08/23   Beather Delon Gibson, PA  levETIRAcetam  (KEPPRA ) 1000 MG tablet Take 1 tablet (1,000 mg total) by mouth 2 (two)  times daily. 03/04/24   Amin, Ankit C, MD  Menthol-Methyl Salicylate (MUSCLE RUB) 10-15 % CREA Apply 1 Application topically as needed for muscle pain (arm pain).    [provider]  metFORMIN  (GLUCOPHAGE ) 500 MG tablet Take 2 tablets (1,000 mg total) by mouth 2 (two) times daily with a meal. 07/11/23   Delbert Clam, MD  metoprolol  succinate (TOPROL -XL) 25 MG 24 hr tablet Take 0.5 tablets (12.5 mg total) by mouth daily. 01/08/24   Drusilla Sabas RAMAN, MD  Multiple Vitamin (MULTIVITAMIN WITH MINERALS) TABS tablet Take 1 tablet by mouth daily. 03/04/24   Amin, Ankit C, MD  pantoprazole  (PROTONIX ) 40 MG tablet Take 1 tablet (40 mg total) by mouth 2 (two) times daily before a meal for 60 days, THEN 1 tablet (40 mg total) daily before breakfast. 03/04/24 06/02/24  Amin, Ankit C, MD  thiamine  (VITAMIN B1) 100 MG tablet Take 1 tablet (100 mg total) by mouth daily. 03/05/24  Caleen Burgess BROCKS, MD    Physical Exam: Vitals:   04/24/24 0600 04/24/24 0634 04/24/24 0734 04/24/24 0834  BP: 134/78 134/78 123/74 128/77  Pulse: (!) 108 (!) 108 (!) 106 (!) 105  Resp:   20 18  Temp:   99.5 F (37.5 C) 98.8 F (37.1 C)  TempSrc:   Oral   SpO2:  99% 98% 100%  Weight:      Height:       General exam: Awake, laying in bed, in nad Respiratory system: Normal respiratory effort, no wheezing Cardiovascular system: regular rate, s1, s2 Gastrointestinal system: Soft, nondistended, positive BS Central nervous system: CN2-12 grossly intact, strength intact Extremities: Perfused, no clubbing Skin: Normal skin turgor, no notable skin lesions seen Psychiatry: Mood normal // no visual hallucinations   Data Reviewed:  Labs reviewed: Na 136, K 3.6, Cr 0.42, Alk phos 219, TB 2.0, WBC 4.5, Hgb 7.8, Plts 121, Mg 0.9  Assessment and Plan: R ankle osteomyelitis -Continued on empiric abx -Orthopedic Surgery consulted  -Hold eliquis  for now -Will order ABI -Cont analgesia as needed  On going ETOH abuse -Elevated ETOH  level in excess of 200's at presentation -Will need cessation -Cont on CIWA as needed  Cirrhosis -Seen on prior CT abd. Also noted to have grade 1 varices on EGD earlier this year -Follow LFT trends -Follow CBC, per bele  Anemia -Hgb down to 7.9, in the setting of eliquis  and known esophageal varices -Hold eliquis  for now per above -Pt reported vomiting bloody emesis overnight -check stool for occult blood -follow CBC trends  DM -cont on SSI as needed  HTN -stable and controlled at this time   Advance Care Planning:   Code Status: Full Code Full  Consults: Orthopedic Surgery  Family Communication: Pt in room  Severity of Illness: The appropriate patient status for this patient is INPATIENT. Inpatient status is judged to be reasonable and necessary in order to provide the required intensity of service to ensure the patient's safety. The patient's presenting symptoms, physical exam findings, and initial radiographic and laboratory data in the context of their chronic comorbidities is felt to place them at high risk for further clinical deterioration. Furthermore, it is not anticipated that the patient will be medically stable for discharge from the hospital within 2 midnights of admission.   * I certify that at the point of admission it is my clinical judgment that the patient will require inpatient hospital care spanning beyond 2 midnights from the point of admission due to high intensity of service, high risk for further deterioration and high frequency of surveillance required.*  Author: Garnette Pelt, MD 04/24/2024 10:13 AM  For on call review [email or web address].

## 2024-04-24 NOTE — Hospital Course (Signed)
 53 year old male with history of chronic osteomyelitis of the right ankle, chronic alcohol abuse, poorly controlled type 2 diabetes mellitus, with most recent hemoglobin A1c 9.6% in September 2025, who is being admitted to [facility] with worsening osteomyelitis of the right ankle after presenting to [facility] emergency department for evaluation of right ankle swelling, erythema.    The patient has a history of chronic osteomyelitis involving the right ankle, and has been following with orthopedic surgery as well as infectious disease as an outpatient for this.  Orthopedic surgery is reportedly discussed with patient the possibility of subsequently requiring amputation procedure for his chronic right ankle osteomyelitis.  The patient preferred to try conservative measures, and is separately been on antibiotics for this, but is experienced worsening erythema, increased swelling involving the right hand, in spite of interval antibiotics.   In the ED this evening, he underwent plain films of the right ankle, which show interval worsening and associated osteomyelitis.    Per presenting CMP, glucose elevated to 298.  He also has a history of chronic alcohol abuse, and is noted to have a serum ethanol level of 255

## 2024-04-24 NOTE — Progress Notes (Signed)
  Carryover admission to the Day Admitter.  I discussed this case with the EDP, Dr. Randol.  Per these discussions:   This is a 53 year old male with history of chronic osteomyelitis of the right ankle, chronic alcohol abuse, poorly controlled type 2 diabetes mellitus, with most recent hemoglobin A1c 9.6% in September 2025, who is being admitted to [facility] with worsening osteomyelitis of the right ankle after presenting to [facility] emergency department for evaluation of right ankle swelling, erythema.   The patient has a history of chronic osteomyelitis involving the right ankle, and has been following with orthopedic surgery as well as infectious disease as an outpatient for this.  Orthopedic surgery is reportedly discussed with patient the possibility of subsequently requiring amputation procedure for his chronic right ankle osteomyelitis.  The patient preferred to try conservative measures, and is separately been on antibiotics for this, but is experienced worsening erythema, increased swelling involving the right hand, in spite of interval antibiotics.  In the ED this evening, he underwent plain films of the right ankle, which show interval worsening and associated osteomyelitis.   Per presenting CMP, glucose elevated to 298.  He also has a history of chronic alcohol abuse, and is noted to have a serum ethanol level of 255 this evening.  EDP at [facility] discussed patient's case with on-call orthopedic surgery, Dr. Vernetta, who conveyed that Dr. Harden will consult/see the patient in the morning, and requested Hospitalist admission to [facility].   I have placed an order for inpatient admission to a med/tele bed at [facility], per request of orthopedic surgery, as above.  I have placed some additional preliminary admit orders via the adult multi-morbid admission order set. I have also ordered IV vancomycin , cefepime for worsening of osteomyelitis involving the right ankle.  Have also ordered CRP,  ESR, prn IV fentanyl , in addition to the morning labs that include CMP, CBC, and magnesium  level.  Regarding his hyperglycemia in the setting of poorly controlled type 2 diabetes mellitus, have ordered lactated Ringer 's at 100 cc/h x 12 hours, as well as before every meal/at bedtime CBG monitoring with moderate dose sliding scale insulin .  The setting of his history of chronic alcohol abuse, I have completed the CIWA protocol order set and have placed consult with transition of care team.  I have also ordered daily thiamine , folic acid , multivitamin supplementation, with first dose of thiamine  supplementation to occur now.    Eva Pore, DO Hospitalist

## 2024-04-25 DIAGNOSIS — F10920 Alcohol use, unspecified with intoxication, uncomplicated: Secondary | ICD-10-CM | POA: Diagnosis not present

## 2024-04-25 DIAGNOSIS — M86671 Other chronic osteomyelitis, right ankle and foot: Secondary | ICD-10-CM | POA: Diagnosis not present

## 2024-04-25 LAB — GLUCOSE, CAPILLARY
Glucose-Capillary: 223 mg/dL — ABNORMAL HIGH (ref 70–99)
Glucose-Capillary: 161 mg/dL — ABNORMAL HIGH (ref 70–99)
Glucose-Capillary: 263 mg/dL — ABNORMAL HIGH (ref 70–99)
Glucose-Capillary: 279 mg/dL — ABNORMAL HIGH (ref 70–99)

## 2024-04-25 LAB — COMPREHENSIVE METABOLIC PANEL WITH GFR
ALT: 16 U/L (ref 0–44)
AST: 47 U/L — ABNORMAL HIGH (ref 15–41)
Albumin: 2 g/dL — ABNORMAL LOW (ref 3.5–5.0)
Alkaline Phosphatase: 234 U/L — ABNORMAL HIGH (ref 38–126)
Anion gap: 12 (ref 5–15)
BUN: 5 mg/dL — ABNORMAL LOW (ref 6–20)
CO2: 19 mmol/L — ABNORMAL LOW (ref 22–32)
Calcium: 8 mg/dL — ABNORMAL LOW (ref 8.9–10.3)
Chloride: 99 mmol/L (ref 98–111)
Creatinine, Ser: 0.45 mg/dL — ABNORMAL LOW (ref 0.61–1.24)
GFR, Estimated: >60 mL/min (ref >60)
Glucose, Bld: 222 mg/dL — ABNORMAL HIGH (ref 70–99)
Potassium: 3.9 mmol/L (ref 3.5–5.1)
Sodium: 130 mmol/L — ABNORMAL LOW (ref 135–145)
Total Bilirubin: 1.9 mg/dL — ABNORMAL HIGH (ref 0.0–1.2)
Total Protein: 5.9 g/dL — ABNORMAL LOW (ref 6.5–8.1)

## 2024-04-25 LAB — CBC
HCT: 26.8 % — ABNORMAL LOW (ref 39.0–52.0)
Hemoglobin: 8.3 g/dL — ABNORMAL LOW (ref 13.0–17.0)
MCH: 25 pg — ABNORMAL LOW (ref 26.0–34.0)
MCHC: 31 g/dL (ref 30.0–36.0)
MCV: 80.7 fL (ref 80.0–100.0)
Platelets: 117 K/uL — ABNORMAL LOW (ref 150–400)
RBC: 3.32 MIL/uL — ABNORMAL LOW (ref 4.22–5.81)
RDW: 21.1 % — ABNORMAL HIGH (ref 11.5–15.5)
WBC: 4.4 K/uL (ref 4.0–10.5)
nRBC: 0 % (ref 0.0–0.2)

## 2024-04-25 LAB — MAGNESIUM: Magnesium: 1.3 mg/dL — ABNORMAL LOW (ref 1.7–2.4)

## 2024-04-25 MED ORDER — MAGNESIUM SULFATE 4 GM/100ML IV SOLN
4.0000 g | Freq: Once | INTRAVENOUS | Status: AC
  Administered 2024-04-25: 4 g via INTRAVENOUS
  Filled 2024-04-25: qty 100

## 2024-04-25 NOTE — Progress Notes (Signed)
  Progress Note   Patient: Gary Mitchell FMW:[id number] DOB: 12-18-1970 DOA: 04/23/2024     1 DOS: the patient was seen and examined on 04/25/2024   Brief hospital course: 53 year old male with history of chronic osteomyelitis of the right ankle, chronic alcohol abuse, poorly controlled type 2 diabetes mellitus, with most recent hemoglobin A1c 9.6% in September 2025, who is being admitted to [facility] with worsening osteomyelitis of the right ankle after presenting to [facility] emergency department for evaluation of right ankle swelling, erythema.    The patient has a history of chronic osteomyelitis involving the right ankle, and has been following with orthopedic surgery as well as infectious disease as an outpatient for this.  Orthopedic surgery is reportedly discussed with patient the possibility of subsequently requiring amputation procedure for his chronic right ankle osteomyelitis.  The patient preferred to try conservative measures, and is separately been on antibiotics for this, but is experienced worsening erythema, increased swelling involving the right hand, in spite of interval antibiotics.   In the ED this evening, he underwent plain films of the right ankle, which show interval worsening and associated osteomyelitis.    Per presenting CMP, glucose elevated to 298.  He also has a history of chronic alcohol abuse, and is noted to have a serum ethanol level of 255  Assessment and Plan: R ankle osteomyelitis -Continued on empiric abx, cefepime, vanc -Orthopedic Surgery consulted. Pt emphatic that he will not consider amputation surgery -Ortho recs for fracture bood. May be weightbearing as tolerated  On going ETOH abuse -Elevated ETOH level in excess of 200's at presentation -Will need cessation -Cont on CIWA as needed  Cirrhosis -Seen on prior CT abd. Also noted to have grade 1 varices on EGD earlier this year -Follow LFT trends -CBC stable  Anemia -Hgb down to 7.9,  in the setting of eliquis  and known esophageal varices -Pt reported vomiting bloody emesis in ED, none since -hgb trends stable and near baseline -Had earlier d/w GI who recommended monitoring CBC trends  DM -cont on SSI as needed  HTN -stable and controlled at this time  Hypomagnesemia -replaced  Subjective: Without complaints  Physical Exam: Vitals:   04/25/24 0500 04/25/24 0736 04/25/24 1214 04/25/24 1627  BP: 117/73 125/77 120/76 116/75  Pulse: 94 79 82 86  Resp:      Temp:  (!) 97.3 F (36.3 C)  98.1 F (36.7 C)  TempSrc:  Oral  Oral  SpO2:  100% 99% 100%  Weight:      Height:       General exam: Awake, laying in bed, in nad Respiratory system: Normal respiratory effort, no wheezing Cardiovascular system: regular rate, s1, s2 Gastrointestinal system: Soft, nondistended, positive BS Central nervous system: CN2-12 grossly intact, strength intact Extremities: Perfused, no clubbing Skin: Normal skin turgor, no notable skin lesions seen Psychiatry: Mood normal // no visual hallucinations   Data Reviewed:  Labs reviewed: Na 130, K 3.9, Cr 0.45, WBC 4.4, Hgb 8.3, Plts 117  Family Communication: Pt in room  Disposition: Status is: Inpatient Remains inpatient appropriate because: Severity of illness  Planned Discharge Destination: Jail     Author: Garnette Pelt, MD 04/25/2024 5:12 PM  For on call review [email or web address].

## 2024-04-25 NOTE — Plan of Care (Signed)

## 2024-04-26 ENCOUNTER — Inpatient Hospital Stay (HOSPITAL_COMMUNITY)

## 2024-04-26 DIAGNOSIS — M86671 Other chronic osteomyelitis, right ankle and foot: Secondary | ICD-10-CM | POA: Diagnosis not present

## 2024-04-26 DIAGNOSIS — F10920 Alcohol use, unspecified with intoxication, uncomplicated: Secondary | ICD-10-CM | POA: Diagnosis not present

## 2024-04-26 LAB — COMPREHENSIVE METABOLIC PANEL WITH GFR
ALT: 21 U/L (ref 0–44)
AST: 59 U/L — ABNORMAL HIGH (ref 15–41)
Albumin: 2.4 g/dL — ABNORMAL LOW (ref 3.5–5.0)
Alkaline Phosphatase: 268 U/L — ABNORMAL HIGH (ref 38–126)
Anion gap: 8 (ref 5–15)
BUN: 7 mg/dL (ref 6–20)
CO2: 21 mmol/L — ABNORMAL LOW (ref 22–32)
Calcium: 8.5 mg/dL — ABNORMAL LOW (ref 8.9–10.3)
Chloride: 101 mmol/L (ref 98–111)
Creatinine, Ser: 0.51 mg/dL — ABNORMAL LOW (ref 0.61–1.24)
GFR, Estimated: >60 mL/min (ref >60)
Glucose, Bld: 119 mg/dL — ABNORMAL HIGH (ref 70–99)
Potassium: 4 mmol/L (ref 3.5–5.1)
Sodium: 130 mmol/L — ABNORMAL LOW (ref 135–145)
Total Bilirubin: 2.1 mg/dL — ABNORMAL HIGH (ref 0.0–1.2)
Total Protein: 8.4 g/dL — ABNORMAL HIGH (ref 6.5–8.1)

## 2024-04-26 LAB — CBC
HCT: 28.7 % — ABNORMAL LOW (ref 39.0–52.0)
Hemoglobin: 9.2 g/dL — ABNORMAL LOW (ref 13.0–17.0)
MCH: 25.1 pg — ABNORMAL LOW (ref 26.0–34.0)
MCHC: 32.1 g/dL (ref 30.0–36.0)
MCV: 78.4 fL — ABNORMAL LOW (ref 80.0–100.0)
Platelets: 145 K/uL — ABNORMAL LOW (ref 150–400)
RBC: 3.66 MIL/uL — ABNORMAL LOW (ref 4.22–5.81)
RDW: 20.7 % — ABNORMAL HIGH (ref 11.5–15.5)
WBC: 6.5 K/uL (ref 4.0–10.5)
nRBC: 0 % (ref 0.0–0.2)

## 2024-04-26 LAB — GLUCOSE, CAPILLARY
Glucose-Capillary: 167 mg/dL — ABNORMAL HIGH (ref 70–99)
Glucose-Capillary: 162 mg/dL — ABNORMAL HIGH (ref 70–99)
Glucose-Capillary: 164 mg/dL — ABNORMAL HIGH (ref 70–99)

## 2024-04-26 LAB — MAGNESIUM: Magnesium: 1.6 mg/dL — ABNORMAL LOW (ref 1.7–2.4)

## 2024-04-26 MED ORDER — MAGNESIUM SULFATE 4 GM/100ML IV SOLN
4.0000 g | Freq: Once | INTRAVENOUS | Status: AC
  Administered 2024-04-26: 4 g via INTRAVENOUS
  Filled 2024-04-26: qty 100

## 2024-04-26 MED ORDER — DOXYCYCLINE HYCLATE 100 MG PO TABS: 100.0000 mg | ORAL_TABLET | Freq: Two times a day (BID) | ORAL | Status: DC

## 2024-04-26 MED ORDER — AMOXICILLIN-POT CLAVULANATE 875-125 MG PO TABS: 1.0000 | ORAL_TABLET | Freq: Two times a day (BID) | ORAL | Status: DC

## 2024-04-26 NOTE — Discharge Summary (Signed)
 Physician Discharge Summary   Patient: Gary Mitchell MRN: [id number] DOB: 1971-04-17  Admit date:     04/23/2024  Discharge date: 04/26/24  Discharge Physician: Garnette Pelt   PCP: Delbert Clam, MD   Recommendations at discharge:    Follow up with PCP in 1-2 weeks Follow up with Orthopedic Surgery as needed Follow up with ID as already scheduled  Discharge Diagnoses: Principal Problem:   Osteomyelitis of right ankle (HCC)  Resolved Problems:   * No resolved hospital problems. *  Hospital Course: 53 year old male with history of chronic osteomyelitis of the right ankle, chronic alcohol abuse, poorly controlled type 2 diabetes mellitus, with most recent hemoglobin A1c 9.6% in September 2025, who is being admitted to [facility] with worsening osteomyelitis of the right ankle after presenting to [facility] emergency department for evaluation of right ankle swelling, erythema.    The patient has a history of chronic osteomyelitis involving the right ankle, and has been following with orthopedic surgery as well as infectious disease as an outpatient for this.  Orthopedic surgery is reportedly discussed with patient the possibility of subsequently requiring amputation procedure for his chronic right ankle osteomyelitis.  The patient preferred to try conservative measures, and is separately been on antibiotics for this, but is experienced worsening erythema, increased swelling involving the right hand, in spite of interval antibiotics.   In the ED this evening, he underwent plain films of the right ankle, which show interval worsening and associated osteomyelitis.    Per presenting CMP, glucose elevated to 298.  He also has a history of chronic alcohol abuse, and is noted to have a serum ethanol level of 255  Assessment and Plan: R ankle osteomyelitis -Was initially continued on empiric abx, cefepime, vanc -Orthopedic Surgery consulted. Pt emphatic that he will not consider  amputation surgery -Ortho recs for fracture bood. May be weightbearing as tolerated -Discussed with ID. Recommendation to complete course of Augmentin  and Doxycycline  through at least his ID f/u on 10/30   On going ETOH abuse -Elevated ETOH level in excess of 200's at presentation -Will need cessation -No evidence of withdrawals this visit   Cirrhosis -Seen on prior CT abd. Also noted to have grade 1 varices on EGD earlier this year -LFT's overall remained stable   Anemia -Hgb down to 7.9, in the setting of eliquis  and known esophageal varices -Pt reported vomiting bloody emesis in ED, none since -hgb trends remained stable -Had earlier d/w GI who recommended monitoring CBC trends   DM -cont on SSI as needed this visit   HTN -stable and controlled at this time   Hypomagnesemia -replaced    Consultants: Orthopedic Surgery Procedures performed:   Disposition: Return to [place] Diet recommendation:  Carb modified diet DISCHARGE MEDICATION: Allergies as of 04/26/2024       Reactions   Iohexol  Other (See Comments)   Unknown reaction at 29 days old Mom at bedside reported patient was given injections of iohexol  in foot and resulted in hole in foot or foot got infected   Nsaids Other (See Comments)   Patient said he was told he could take Tylenol  ONLY.        Medication List     TAKE these medications    acetaminophen  500 MG tablet Commonly known as: TYLENOL  Take 1,500 mg by mouth daily as needed for moderate pain (pain score 4-6), fever or headache.   amoxicillin -clavulanate 875-125 MG tablet Commonly known as: AUGMENTIN  Take 1 tablet by mouth 2 (two)  times daily for 11 days.   atorvastatin  20 MG tablet Commonly known as: LIPITOR Take 1 tablet (20 mg total) by mouth daily.   CertaVite/Antioxidants Tabs Take 1 tablet by mouth daily.   doxycycline  100 MG tablet Commonly known as: VIBRA -TABS Take 1 tablet (100 mg total) by mouth 2 (two) times daily for 11  days.   Eliquis  2.5 MG Tabs tablet Generic drug: apixaban  Take 1 tablet (2.5 mg total) by mouth 2 (two) times daily for 28 days.   folic acid  1 MG tablet Commonly known as: FOLVITE  Take 1 tablet (1 mg total) by mouth daily.   FreeStyle Libre 3 Plus Sensor Misc Change sensor every 15 days. Use to check blood sugar continuously.   ibuprofen  200 MG tablet Commonly known as: ADVIL  Take 400 mg by mouth every 6 (six) hours as needed for mild pain (pain score 1-3) or moderate pain (pain score 4-6).   insulin  lispro 100 UNIT/ML KwikPen Commonly known as: HumaLOG  KwikPen Inject 10 Units into the skin 3 (three) times daily.   Jardiance  10 MG Tabs tablet Generic drug: empagliflozin  Take 1 tablet (10 mg total) by mouth daily before breakfast.   lactulose  10 GM/15ML solution Commonly known as: CHRONULAC  Take 45 mLs (30 g total) by mouth as needed for mild constipation.   Lantus  SoloStar 100 UNIT/ML Solostar Pen Generic drug: insulin  glargine Inject 32 Units into the skin daily.   levETIRAcetam  1000 MG tablet Commonly known as: Keppra  Take 1 tablet (1,000 mg total) by mouth 2 (two) times daily.   metFORMIN  500 MG tablet Commonly known as: GLUCOPHAGE  Take 2 tablets (1,000 mg total) by mouth 2 (two) times daily with a meal.   metoprolol  succinate 25 MG 24 hr tablet Commonly known as: TOPROL -XL Take 0.5 tablets (12.5 mg total) by mouth daily.   Muscle Rub 10-15 % Crea Apply 1 Application topically as needed for muscle pain (arm pain).   pantoprazole  40 MG tablet Commonly known as: PROTONIX  Take 1 tablet (40 mg total) by mouth 2 (two) times daily before a meal for 60 days, THEN 1 tablet (40 mg total) daily before breakfast. Start taking on: March 04, 2024   thiamine  100 MG tablet Commonly known as: VITAMIN B1 Take 1 tablet (100 mg total) by mouth daily.        Follow-up Information     Delbert Clam, MD Follow up in 2 week(s).   Specialty: Family Medicine Why:  Hospital follow up Contact information: [address] [place] [address] [place] [address] [phone]         [place] Constance DASEN, MD Follow up on 05/06/2024.   Specialty: Infectious Diseases Why: as scheduled Contact information: [address] [place] [address] [phone]         Harden Jerona GAILS, MD Follow up.   Specialty: Orthopedic Surgery Why: As needed Contact information: [address]  [place] [address] [phone]                Discharge Exam: Fredricka Weights   04/24/24 0100 04/24/24 0417  Weight: 60.4 kg 61.7 kg   General exam: Awake, laying in bed, in nad Respiratory system: Normal respiratory effort, no wheezing Cardiovascular system: regular rate, s1, s2 Gastrointestinal system: Soft, nondistended, positive BS Central nervous system: CN2-12 grossly intact, strength intact Extremities: Perfused, no clubbing Skin: Normal skin turgor, no notable skin lesions seen Psychiatry: Mood normal // no visual hallucinations   Condition at discharge: fair  The results of  significant diagnostics from this hospitalization (including imaging, microbiology, ancillary and laboratory) are listed below for reference.   Imaging Studies: DG Ankle Complete Right Result Date: 04/23/2024 CLINICAL DATA:  Pain, open wounds, drainage EXAM: RIGHT ANKLE - COMPLETE 3+ VIEW COMPARISON:  02/28/2024 FINDINGS: Frontal, oblique, lateral views of the right ankle are obtained. 2 cannulated screws are again seen traversing the medial malleolus, with increased lucency surrounding the screws consistent with infection and/or loosening. There is further distraction of the medial and lateral malleolar fractures, with increased angulation at the lateral malleolar fracture site. Bony resorption is seen along the tibial plafond lateral aspect as well as along the talar dome. Findings are concerning for underlying osteomyelitis and septic arthritis. Increased callus formation  along the lateral malleolar fracture site. Marked increased soft tissue swelling. No subcutaneous gas or radiopaque foreign body. IMPRESSION: 1. Increased soft tissue swelling, with findings consistent with progressive osteomyelitis and septic arthritis of the right ankle. Electronically Signed   By: Ozell Daring M.D.   On: 04/23/2024 21:43    Microbiology: Results for orders placed or performed during the hospital encounter of 02/28/24  Culture, blood (Routine X 2) w Reflex to ID Panel     Status: None   Collection Time: 02/29/24 12:19 AM   Specimen: BLOOD RIGHT HAND  Result Value Ref Range Status   Specimen Description BLOOD RIGHT HAND  Final   Special Requests   Final    BOTTLES DRAWN AEROBIC AND ANAEROBIC Blood Culture adequate volume   Culture   Final    NO GROWTH 5 DAYS Performed at [facility], [address], [place], [place] [address]    Report Status 03/05/2024 FINAL  Final  Culture, blood (Routine X 2) w Reflex to ID Panel     Status: None   Collection Time: 02/29/24  6:44 AM   Specimen: BLOOD RIGHT HAND  Result Value Ref Range Status   Specimen Description BLOOD RIGHT HAND  Final   Special Requests   Final    BOTTLES DRAWN AEROBIC AND ANAEROBIC Blood Culture adequate volume   Culture   Final    NO GROWTH 5 DAYS Performed at [facility], [address] [place], [place] [address]    Report Status 03/05/2024 FINAL  Final    Labs: CBC: Recent Labs  Lab 04/23/24 2122 04/24/24 0732 04/24/24 1134 04/25/24 0518 04/26/24 0447  WBC 6.2 4.5  --  4.4 6.5  NEUTROABS  --  2.5  --   --   --   HGB 9.3* 7.8* 7.8* 8.3* 9.2*  HCT 31.7* 24.4* 24.7* 26.8* 28.7*  MCV 83.2 80.0  --  80.7 78.4*  PLT 208 121*  --  117* 145*   Basic Metabolic Panel: Recent Labs  Lab 04/23/24 2122 04/24/24 0732 04/25/24 0518 04/26/24 0447  NA 143 136 130* 130*  K 3.8 3.6 3.9 4.0  CL 103 99 99 101  CO2 24 23 19* 21*  GLUCOSE 290* 256* 222* 119*  BUN 8 5* 5* 7  CREATININE  0.52* 0.42* 0.45* 0.51*  CALCIUM  9.5 8.2* 8.0* 8.5*  MG  --  0.9* 1.3* 1.6*   Liver Function Tests: Recent Labs  Lab 04/23/24 2122 04/24/24 0732 04/25/24 0518 04/26/24 0447  AST 49* 35 47* 59*  ALT 19 14 16 21   ALKPHOS 352* 219* 234* 268*  BILITOT 1.8* 2.0* 1.9* 2.1*  PROT 9.0* 6.9 5.9* 8.4*  ALBUMIN 3.5 2.1* 2.0* 2.4*   CBG: Recent Labs  Lab 04/25/24 1211 04/25/24 1624 04/25/24 2017 04/26/24 0759 04/26/24 1132  GLUCAP 161* 263* 279* 167* 162*    Discharge time spent: less than 30 minutes.  Signed: Garnette Pelt, MD [facility] 04/26/2024

## 2024-04-26 NOTE — Plan of Care (Signed)
  Problem: Education: Goal: Ability to describe self-care measures that may prevent or decrease complications (Diabetes Survival Skills Education) will improve Outcome: Progressing   Problem: Fluid Volume: Goal: Ability to maintain a balanced intake and output will improve Outcome: Progressing   Problem: Coping: Goal: Ability to adjust to condition or change in health will improve Outcome: Not Progressing   Problem: Metabolic: Goal: Ability to maintain appropriate glucose levels will improve Outcome: Not Progressing   Problem: Skin Integrity: Goal: Risk for impaired skin integrity will decrease Outcome: Not Progressing

## 2024-04-26 NOTE — Plan of Care (Signed)

## 2024-04-27 ENCOUNTER — Telehealth: Payer: Self-pay | Admitting: *Deleted

## 2024-04-27 NOTE — Transitions of Care (Post Inpatient/ED Visit) (Signed)
   04/27/2024  Name: Gary Mitchell MRN: [id number] DOB: 27-Jul-1970  Today's TOC FU Call Status: Today's TOC FU Call Status:: Unsuccessful Call (1st Attempt) Unsuccessful Call (1st Attempt) Date: 04/27/24  Attempted to reach the patient regarding the most recent Inpatient/ED visit.  Follow Up Plan: Additional outreach attempts will be made to reach the patient to complete the Transitions of Care (Post Inpatient/ED visit) call.   Andrea Dimes RN, BSN [place]  [facility] RN Care Manager [phone]

## 2024-04-28 ENCOUNTER — Other Ambulatory Visit: Payer: Self-pay

## 2024-04-28 ENCOUNTER — Other Ambulatory Visit: Payer: Self-pay | Admitting: Family Medicine

## 2024-04-28 MED ORDER — DOXYCYCLINE HYCLATE 100 MG PO TABS
100.0000 mg | ORAL_TABLET | Freq: Two times a day (BID) | ORAL | 0 refills | Status: DC
  Filled 2024-04-28: qty 22, 11d supply, fill #0

## 2024-04-28 MED ORDER — DOXYCYCLINE HYCLATE 100 MG PO TABS: 100.0000 mg | ORAL_TABLET | Freq: Two times a day (BID) | ORAL | 0 refills | Status: AC

## 2024-04-28 MED ORDER — AMOXICILLIN-POT CLAVULANATE 875-125 MG PO TABS
1.0000 | ORAL_TABLET | Freq: Two times a day (BID) | ORAL | 0 refills | Status: DC
  Filled 2024-04-28: qty 22, 11d supply, fill #0

## 2024-04-28 MED ORDER — AMOXICILLIN-POT CLAVULANATE 875-125 MG PO TABS: 1.0000 | ORAL_TABLET | Freq: Two times a day (BID) | ORAL | 0 refills | Status: AC

## 2024-05-06 ENCOUNTER — Other Ambulatory Visit: Payer: Self-pay | Admitting: Family Medicine

## 2024-05-06 ENCOUNTER — Other Ambulatory Visit: Payer: Self-pay

## 2024-05-06 ENCOUNTER — Ambulatory Visit: Admitting: Internal Medicine

## 2024-05-06 ENCOUNTER — Encounter: Payer: Self-pay | Admitting: Internal Medicine

## 2024-05-06 ENCOUNTER — Other Ambulatory Visit (HOSPITAL_COMMUNITY): Payer: Self-pay

## 2024-05-06 VITALS — BP 117/72 | HR 90 | Temp 98.6°F | Resp 16 | Wt 145.4 lb

## 2024-05-06 DIAGNOSIS — B9689 Other specified bacterial agents as the cause of diseases classified elsewhere: Secondary | ICD-10-CM

## 2024-05-06 DIAGNOSIS — T847XXD Infection and inflammatory reaction due to other internal orthopedic prosthetic devices, implants and grafts, subsequent encounter: Secondary | ICD-10-CM

## 2024-05-06 DIAGNOSIS — M86171 Other acute osteomyelitis, right ankle and foot: Secondary | ICD-10-CM

## 2024-05-06 DIAGNOSIS — M8660 Other chronic osteomyelitis, unspecified site: Secondary | ICD-10-CM

## 2024-05-06 NOTE — Patient Instructions (Addendum)
 Please make sure you see dr Harden  Chart forwarded to him   If you haven't heard from him within 2-4 weeks please let us  know  See me in 6 weeks   Finish a few more days of antibiotics but no more

## 2024-05-06 NOTE — Progress Notes (Signed)
 [facility]  Patient Active Problem List   Diagnosis Date Noted   Osteomyelitis of right ankle (HCC) 04/24/2024   Subacute osteomyelitis of right ankle (HCC) 03/01/2024   Acute gastric ulcer with hemorrhage 03/01/2024   Hyperglycemia 03/01/2024   AMS (altered mental status) 02/28/2024   Infected hardware in right leg 02/04/2024   Infected hardware in right lower extremity 02/04/2024   Closed right ankle fracture 01/05/2024   Depression 11/17/2023   Pancreatic cyst 04/30/2023   Microcytic anemia 04/30/2023   Alcohol use disorder 04/30/2023   Acute metabolic encephalopathy 03/31/2023   Encounter for medication review and counseling 11/13/2022   Paroxysmal atrial fibrillation (HCC) 08/08/2022   Hypercoagulable state due to paroxysmal atrial fibrillation (HCC) 08/08/2022   Malnutrition of moderate degree 05/08/2022   Benign neoplasm of descending colon    Symptomatic anemia 05/05/2022   Hyperlipidemia 05/05/2022   Hyperammonemia 05/03/2022   Type 2 diabetes mellitus (HCC) 05/29/2021   Portal hypertensive gastropathy (HCC)    Esophageal varices without bleeding (HCC)    Seizure disorder (HCC) 09/17/2020   Alcoholic cirrhosis of liver without ascites (HCC) 09/17/2020   Acute hepatic encephalopathy (HCC) 09/17/2020   Hypomagnesemia 09/17/2020   Suspected CVA (cerebral vascular accident) (HCC) 09/17/2020   Hypokalemia 09/17/2020   Seizure (HCC) 07/09/2020   High bilirubin 07/09/2020   Anemia 07/09/2020   Thrombocytopenia 07/09/2020   COVID-19 virus infection 07/09/2020   GERD (gastroesophageal reflux disease) 07/09/2020   Pancreatic mass 11/29/2019   Alcoholic hepatitis (HCC) 11/23/2019   Gallstones 11/23/2019   Alcohol abuse 11/23/2019      Subjective:    Patient ID: Gary Mitchell, male    DOB: Jul 03, 1971, 53 y.o.   MRN: [id number]  Chief Complaint  Patient presents with   Follow-up    Hardware complicating wound infection,  subsequent encounter     HPI:  Gary Mitchell is a 53 y.o. male with cirrhosis, etoh use, right ankle hardware, here for hospital f/u of hardware associated osteomyelitis of the ankle  He was high risk for failure as already failed abx therapy after partial hardware removal 02/04/24, but refused amputation   He is here for f/u on augmentin /doxy for planned 6 weeks from 8/23   05/06/24 id clinic f/u See a&p for detail Was just in jailed (did go back to hospital 10/18) for ongoing wound/pain right ankle - xray showed progression of infection Given abx and on doxy/augmentin  still Has a few more days left augmentin  No n/v/d No f/c   Allergies  Allergen Reactions   Iohexol  Other (See Comments)    Unknown reaction at 52 days old Mom at bedside reported patient was given injections of iohexol  in foot and resulted in hole in foot or foot got infected   Nsaids Other (See Comments)    Patient said he was told he could take Tylenol  ONLY.      Outpatient Medications Prior to Visit  Medication Sig Dispense Refill   acetaminophen  (TYLENOL ) 500 MG tablet Take 1,500 mg by mouth daily as needed for moderate pain (pain score 4-6), fever or headache.     amoxicillin -clavulanate (AUGMENTIN ) 875-125 MG tablet Take 1 tablet by mouth 2 (two) times daily for 11 days. 22 tablet 0   apixaban  (ELIQUIS ) 2.5 MG TABS tablet Take 1 tablet (2.5 mg total) by mouth 2 (two) times daily for 28 days. 56 tablet 0   atorvastatin  (LIPITOR) 20 MG tablet Take 1 tablet (20 mg  total) by mouth daily. 90 tablet 3   Continuous Glucose Sensor (FREESTYLE LIBRE 3 PLUS SENSOR) MISC Change sensor every 15 days. Use to check blood sugar continuously. 2 each 6   doxycycline  (VIBRA -TABS) 100 MG tablet Take 1 tablet (100 mg total) by mouth 2 (two) times daily for 11 days. 22 tablet 0   empagliflozin  (JARDIANCE ) 10 MG TABS tablet Take 1 tablet (10 mg total) by mouth daily before breakfast. 90 tablet 1   folic acid  (FOLVITE ) 1  MG tablet Take 1 tablet (1 mg total) by mouth daily. 90 tablet 0   ibuprofen  (ADVIL ) 200 MG tablet Take 400 mg by mouth every 6 (six) hours as needed for mild pain (pain score 1-3) or moderate pain (pain score 4-6).     insulin  glargine (LANTUS  SOLOSTAR) 100 UNIT/ML Solostar Pen Inject 32 Units into the skin daily. 30 mL 3   insulin  lispro (HUMALOG  KWIKPEN) 100 UNIT/ML KwikPen Inject 10 Units into the skin 3 (three) times daily. 30 mL 3   lactulose  (CHRONULAC ) 10 GM/15ML solution Take 45 mLs (30 g total) by mouth as needed for mild constipation. 236 mL 3   levETIRAcetam  (KEPPRA ) 1000 MG tablet Take 1 tablet (1,000 mg total) by mouth 2 (two) times daily. 60 tablet 0   Menthol-Methyl Salicylate (MUSCLE RUB) 10-15 % CREA Apply 1 Application topically as needed for muscle pain (arm pain).     metFORMIN  (GLUCOPHAGE ) 500 MG tablet Take 2 tablets (1,000 mg total) by mouth 2 (two) times daily with a meal. 360 tablet 1   metoprolol  succinate (TOPROL -XL) 25 MG 24 hr tablet Take 0.5 tablets (12.5 mg total) by mouth daily. 45 tablet 1   Multiple Vitamin (MULTIVITAMIN WITH MINERALS) TABS tablet Take 1 tablet by mouth daily. 30 tablet 0   pantoprazole  (PROTONIX ) 40 MG tablet Take 1 tablet (40 mg total) by mouth 2 (two) times daily before a meal for 60 days, THEN 1 tablet (40 mg total) daily before breakfast. 150 tablet 0   thiamine  (VITAMIN B1) 100 MG tablet Take 1 tablet (100 mg total) by mouth daily. 90 tablet 0   No facility-administered medications prior to visit.     Social History   Socioeconomic History   Marital status: Single    Spouse name: Not on file   Number of children: Not on file   Years of education: Not on file   Highest education level: Not on file  Occupational History   Not on file  Tobacco Use   Smoking status: Former    Current packs/day: 0.00    Average packs/day: 0.1 packs/day for 25.0 years (2.5 ttl pk-yrs)    Types: Cigarettes    Start date: 31    Quit date: 2019     Years since quitting: 6.8    Passive exposure: Past   Smokeless tobacco: Never   Tobacco comments:    Former smoker 08/08/22  Vaping Use   Vaping status: Never Used  Substance and Sexual Activity   Alcohol use: Not Currently   Drug use: Not Currently   Sexual activity: Not Currently  Other Topics Concern   Not on file  Social History Narrative   Not on file   Social Drivers of Health   Financial Resource Strain: High Risk (04/11/2023)   Overall Financial Resource Strain (CARDIA)    Difficulty of Paying Living Expenses: Hard  Food Insecurity: Food Insecurity Present (04/24/2024)   Hunger Vital Sign    Worried About Radiation Protection Practitioner of Food  in the Last Year: Often true    Ran Out of Food in the Last Year: Often true  Transportation Needs: Unmet Transportation Needs (04/24/2024)   PRAPARE - Administrator, Civil Service (Medical): Yes    Lack of Transportation (Non-Medical): Yes  Physical Activity: Patient Declined (05/13/2023)   Exercise Vital Sign    Days of Exercise per Week: Patient declined    Minutes of Exercise per Session: Patient declined  Stress: No Stress Concern Present (05/13/2023)   [employer] of Occupational Health - Occupational Stress Questionnaire    Feeling of Stress : Not at all  Social Connections: Socially Isolated (05/13/2023)   Social Connection and Isolation Panel    Frequency of Communication with Friends and Family: Once a week    Frequency of Social Gatherings with Friends and Family: Once a week    Attends Religious Services: Never    Database Administrator or Organizations: No    Attends Banker Meetings: Never    Marital Status: Never married  Intimate Partner Violence: Patient Declined (04/24/2024)   Humiliation, Afraid, Rape, and Kick questionnaire    Fear of Current or Ex-Partner: Patient declined    Emotionally Abused: Patient declined    Physically Abused: Patient declined    Sexually Abused: Patient declined       Review of Systems    All other ros negative  Objective:    BP 117/72   Pulse 90   Temp 98.6 F (37 C) (Oral)   Resp 16   Wt 145 lb 6.4 oz (66 kg)   SpO2 98%   BMI 24.96 kg/m  Nursing note and vital signs reviewed.  Physical Exam     General/constitutional: no distress, pleasant HEENT: Normocephalic, PER, Conj Clear, EOMI, Oropharynx clear Neck supple CV: rrr no mrg Lungs: clear to auscultation, normal respiratory effort Abd: Soft, Nontender Ext: no edema Skin: No Rash Neuro: nonfocal MSK: rle swelling, mild tenderness; warm to touch no fluctuance; lateral incision dehiscence and chronic wound medial malleolus. Dressing shown purulent discharge        -------------- 05/06/24 picture        Labs: Lab Results  Component Value Date   WBC 6.5 04/26/2024   HGB 9.2 (L) 04/26/2024   HCT 28.7 (L) 04/26/2024   MCV 78.4 (L) 04/26/2024   PLT 145 (L) 04/26/2024   Last metabolic panel Lab Results  Component Value Date   GLUCOSE 119 (H) 04/26/2024   NA 130 (L) 04/26/2024   K 4.0 04/26/2024   CL 101 04/26/2024   CO2 21 (L) 04/26/2024   BUN 7 04/26/2024   CREATININE 0.51 (L) 04/26/2024   GFRNONAA >60 04/26/2024   CALCIUM  8.5 (L) 04/26/2024   PHOS 2.8 03/04/2024   PROT 8.4 (H) 04/26/2024   ALBUMIN 2.4 (L) 04/26/2024   LABGLOB 3.7 08/19/2023   AGRATIO 1.2 08/05/2022   BILITOT 2.1 (H) 04/26/2024   ALKPHOS 268 (H) 04/26/2024   AST 59 (H) 04/26/2024   ALT 21 04/26/2024   ANIONGAP 8 04/26/2024   Lab Results  Component Value Date   CRP 0.5 04/24/2024   Lab Results  Component Value Date   ESRSEDRATE 25 (H) 04/24/2024    Micro:  Serology:  Imaging: Reviewed  04/23/24 xray right ankle Frontal, oblique, lateral views of the right ankle are obtained. 2 cannulated screws are again seen traversing the medial malleolus, with increased lucency surrounding the screws consistent with infection and/or loosening. There is further distraction of  the medial and lateral malleolar fractures, with increased angulation at the lateral malleolar fracture site. Bony resorption is seen along the tibial plafond lateral aspect as well as along the talar dome. Findings are concerning for underlying osteomyelitis and septic arthritis. Increased callus formation along the lateral malleolar fracture site. Marked increased soft tissue swelling. No subcutaneous gas or radiopaque foreign body.   1. Increased soft tissue swelling, with findings consistent with progressive osteomyelitis and septic arthritis of the right ankle.     05/2023 ct abdomen  1. Unchanged cystic lesion arising from the ventral pancreatic body measuring 4.5 x 4.1 cm. As on prior examination, there is severe ductal dilatation of the pancreatic tail distal to this lesion, measuring up to 0.9 cm. Coarse calcifications and heterogeneous cystic change within the pancreatic head and uncinate. Findings are most consistent with a large pseudocyst and underlying stigmata of chronic pancreatitis. Given large size and presence of associated pancreatic ductal dilatation, consider EUS/FNA for definitive diagnosis. 2. Cirrhosis and hepatomegaly. No suspicious liver lesions. 3. Recanalization of the umbilical vein with caput medusa varices, consistent with portal hypertension. 4. Status post left nephrectomy. 5. Cholelithiasis.     Assessment & Plan:   Problem List Items Addressed This Visit   None      No orders of the defined types were placed in this encounter.    Abx: 8/23 vanc/amp-sulb --> doxy/augmentin    Outpatient doxy/augmentin                                                       Assessment: 53 yo male etoh abuse, liver cirrhosis, right ankle hardware complicated by om/infection failing oral abx readmitted 8/23, now here for hospital f/u of same   He was admitted 7/30 for initial infection visit at [facility] underwent lateral screws removal and sent  out on doxy/augmentin  e gallinarum (s amp; r vanc), and mrsa (S doxy). Mixed organisms not worked up   He returns with now medial side skin break down and ct showing significantly worsening imaging   He had refused amputation which Dr Harden had advised. From what ct shows I agree I am not sure this is salvageable   Sent out on 6 weeks doxy/augmentin  8/28 with discussion that I wasn't optimistic this will work   ----------- 03/25/24 id clinic assessment 6 weeks plan abx would end 04/10/24 He has intermittent shooting pain on the right leg/ankle He has purulent/bloody discharge and swelling The medial/lateral incision remains open. There is some suture on the lateral side that needs to be removed  No fever, chill  He hasn't followed up with ortho and the way things look I told him abx likely not doing anything and could be dangerous for him   He can finish abx course and will need at least wound care if not amputation  F/u with me 2-4 weeks after he finish abx    Labs today   ------------ 05/06/24 id clinic f/u Lab Results  Component Value Date   WBC 6.5 04/26/2024   HGB 9.2 (L) 04/26/2024   HCT 28.7 (L) 04/26/2024   MCV 78.4 (L) 04/26/2024   PLT 145 (L) 04/26/2024   Last metabolic panel Lab Results  Component Value Date   GLUCOSE 119 (H) 04/26/2024   NA 130 (L) 04/26/2024   K 4.0 04/26/2024  CL 101 04/26/2024   CO2 21 (L) 04/26/2024   BUN 7 04/26/2024   CREATININE 0.51 (L) 04/26/2024   GFRNONAA >60 04/26/2024   CALCIUM  8.5 (L) 04/26/2024   PHOS 2.8 03/04/2024   PROT 8.4 (H) 04/26/2024   ALBUMIN 2.4 (L) 04/26/2024   LABGLOB 3.7 08/19/2023   AGRATIO 1.2 08/05/2022   BILITOT 2.1 (H) 04/26/2024   ALKPHOS 268 (H) 04/26/2024   AST 59 (H) 04/26/2024   ALT 21 04/26/2024   ANIONGAP 8 04/26/2024   Lab Results  Component Value Date   CRP 0.5 04/24/2024   Lab Results  Component Value Date   ESRSEDRATE 25 (H) 04/24/2024    He has recent ankle xray which looks  very concerning. On abx crp normal not as helpful and would go based on xray finding   I suspect he needs surgical debridement/hardware removal/amputation   He can finish a few more days of given doxy/augmentin  but further abx is of no benefit but rather more harm  I will forward chart to dr Duda's office to have him evaluate patient for surgery   Follow up with me in 6 weeks   He said he is still waiting on new phone and can't access mychart so communication to his mother requested   Follow-up: Return in about 6 weeks (around 06/17/2024).      Constance ONEIDA Passer, MD [facility] 05/06/2024, 1:56 PM

## 2024-05-07 MED ORDER — LEVETIRACETAM 1000 MG PO TABS: 1000.0000 mg | ORAL_TABLET | Freq: Two times a day (BID) | ORAL | 0 refills | Status: DC

## 2024-05-10 ENCOUNTER — Encounter: Payer: Self-pay | Admitting: Radiology

## 2024-05-13 ENCOUNTER — Ambulatory Visit: Admitting: Orthopedic Surgery

## 2024-05-17 ENCOUNTER — Telehealth: Payer: Self-pay

## 2024-05-17 ENCOUNTER — Other Ambulatory Visit (HOSPITAL_COMMUNITY): Payer: Self-pay

## 2024-05-17 NOTE — Telephone Encounter (Signed)
 Left message on machine to call back

## 2024-05-17 NOTE — Telephone Encounter (Signed)
-----   Message from Nurse Naomie RAMAN sent at 12/05/2023 12:37 PM EDT ----- Needs MRI/MRCP around 06/06/24 for f/u pancreatic cyst, ductal dilation- mansouraty pt-see 12/04/23 result note

## 2024-05-18 ENCOUNTER — Other Ambulatory Visit: Payer: Self-pay | Admitting: Family Medicine

## 2024-05-18 DIAGNOSIS — K219 Gastro-esophageal reflux disease without esophagitis: Secondary | ICD-10-CM

## 2024-05-18 DIAGNOSIS — I480 Paroxysmal atrial fibrillation: Secondary | ICD-10-CM

## 2024-05-18 DIAGNOSIS — E1169 Type 2 diabetes mellitus with other specified complication: Secondary | ICD-10-CM

## 2024-05-18 MED ORDER — FREESTYLE LIBRE 3 PLUS SENSOR MISC: 6 refills | Status: DC

## 2024-05-18 MED ORDER — INSULIN LISPRO (1 UNIT DIAL) 100 UNIT/ML (KWIKPEN): 10.0000 [IU] | PEN_INJECTOR | Freq: Three times a day (TID) | SUBCUTANEOUS | 3 refills | Status: AC

## 2024-05-18 NOTE — Telephone Encounter (Signed)
 Copied from CRM [id number]. Topic: Clinical - Medication Refill >> May 18, 2024 10:24 AM Kendralyn S wrote: Medication: insulin  lispro (HUMALOG  KWIKPEN) 100 UNIT/ML KwikPen folic acid  (FOLVITE ) 1 MG tablet metoprolol  succinate (TOPROL -XL) 25 MG 24 hr tablet insulin  glargine (LANTUS  SOLOSTAR) 100 UNIT/ML Solostar Pen empagliflozin  (JARDIANCE ) 10 MG TABS tablet thiamine  (VITAMIN B1) 100 MG tablet Multiple Vitamin (MULTIVITAMIN WITH MINERALS) TABS tablet Continuous Glucose Sensor (FREESTYLE LIBRE 3 PLUS SENSOR) MISC pantoprazole  (PROTONIX ) 40 MG tablet metFORMIN  (GLUCOPHAGE ) 500 MG tablet levETIRAcetam  (KEPPRA ) 1000 MG tablet Pen needles Alcohol pads   Has the patient contacted their pharmacy? Yes (Agent: If no, request that the patient contact the pharmacy for the refill. If patient does not wish to contact the pharmacy document the reason why and proceed with request.) (Agent: If yes, when and what did the pharmacy advise?)  This is the patient's preferred pharmacy:   [facility], [place] - [address] [place] [address] Phone: [phone] Fax: [phone]  Is this the correct pharmacy for this prescription? Yes If no, delete pharmacy and type the correct one.   Has the prescription been filled recently? No  Is the patient out of the medication? No  Has the patient been seen for an appointment in the last year OR does the patient have an upcoming appointment? Yes  Can we respond through MyChart? Yes  Agent: Please be advised that Rx refills may take up to 3 business days. We ask that you follow-up with your pharmacy.

## 2024-05-18 NOTE — Telephone Encounter (Signed)
 Unable to pend:  Folic Acid  Thiamine  Pen Needles Alcohol Pads Metformin 

## 2024-05-18 NOTE — Telephone Encounter (Signed)
 Mailbox full will mail letter and send to My Chart

## 2024-05-19 DIAGNOSIS — Z419 Encounter for procedure for purposes other than remedying health state, unspecified: Secondary | ICD-10-CM | POA: Diagnosis not present

## 2024-05-20 ENCOUNTER — Other Ambulatory Visit: Payer: Self-pay | Admitting: Family Medicine

## 2024-05-20 NOTE — Telephone Encounter (Signed)
 Copied from CRM #[id number]. Topic: Clinical - Medication Refill >> May 20, 2024 11:16 AM Donna BRAVO wrote: Medication:  -thiamine  (VITAMIN B1) 100 MG tablet -Continuous Glucose Sensor Kit (FREESTYLE LIBRE 3 PLUS SENSOR) MISC  -Insulin  Pen Needle (TRUEPLUS 5-BEVEL PEN NEEDLES) 32G X 4 MM MISC -Alcohol pads     Has the patient contacted their pharmacy? Yes Pharmacy calling  This is the patient's preferred pharmacy:   [facility], [place] - [address] [place] [address] Phone: [phone] Fax: [phone]  Is this the correct pharmacy for this prescription? Yes If no, delete pharmacy and type the correct one.   Has the prescription been filled recently? Yes  Is the patient out of the medication? Yes  Has the patient been seen for an appointment in the last year OR does the patient have an upcoming appointment? No  Can we respond through MyChart? No  Agent: Please be advised that Rx refills may take up to 3 business days. We ask that you follow-up with your pharmacy.

## 2024-05-24 ENCOUNTER — Other Ambulatory Visit: Payer: Self-pay | Admitting: Family Medicine

## 2024-05-24 MED ORDER — FREESTYLE LIBRE 3 PLUS SENSOR MISC: 6 refills | Status: AC

## 2024-05-24 MED ORDER — THIAMINE HCL 100 MG PO TABS: 100.0000 mg | ORAL_TABLET | Freq: Every day | ORAL | 0 refills | Status: AC

## 2024-05-24 NOTE — Telephone Encounter (Signed)
 Copied from CRM #[id number]. Topic: Clinical - Medication Refill >> May 24, 2024  1:01 PM Montie POUR wrote: Medication:  thiamine  (VITAMIN B1) 100 MG tablet Continuous Glucose Sensor (FREESTYLE LIBRE 3 PLUS SENSOR) MISC  Pen needles and acholic swabs   Has the patient contacted their pharmacy? Yes (Agent: If no, request that the patient contact the pharmacy for the refill. If patient does not wish to contact the pharmacy document the reason why and proceed with request.) (Agent: If yes, when and what did the pharmacy advise?) Pharmacy needs order to refill  This is the patient's preferred pharmacy:  [facility], [place] - [address] [place] [address] Phone: [phone] Fax: [phone]  Is this the correct pharmacy for this prescription? Yes If no, delete pharmacy and type the correct one.   Has the prescription been filled recently? No  Is the patient out of the medication? No  Has the patient been seen for an appointment in the last year OR does the patient have an upcoming appointment? Yes  Can we respond through MyChart? No  Agent: Please be advised that Rx refills may take up to 3 business days. We ask that you follow-up with your pharmacy.

## 2024-05-24 NOTE — Telephone Encounter (Signed)
 Pen needles and acholic swabs  not on current list.

## 2024-05-26 ENCOUNTER — Telehealth: Payer: Self-pay | Admitting: Family Medicine

## 2024-05-26 DIAGNOSIS — E1169 Type 2 diabetes mellitus with other specified complication: Secondary | ICD-10-CM

## 2024-05-26 NOTE — Telephone Encounter (Unsigned)
 Copied from CRM [id number]. Topic: Clinical - Medication Refill >> May 26, 2024  2:06 PM Hadassah PARAS wrote: Medication: Pen needles and acholic swabs   Has the patient contacted their pharmacy? Yes (Agent: If no, request that the patient contact the pharmacy for the refill. If patient does not wish to contact the pharmacy document the reason why and proceed with request.) (Agent: If yes, when and what did the pharmacy advise?)  This is the patient's preferred pharmacy:  [facility] [address] [place] [address] Phone: [phone] Fax: [phone]    [facility] - [address], [place] - [address] [place] [address] Phone: [phone] Fax: [phone]  Is this the correct pharmacy for this prescription? Yes If no, delete pharmacy and type the correct one.   Has the prescription been filled recently? Yes  Is the patient out of the medication? Yes  Has the patient been seen for an appointment in the last year OR does the patient have an upcoming appointment? Yes  Can we respond through MyChart? Yes  Agent: Please be advised that Rx refills may take up to 3 business days. We ask that you follow-up with your pharmacy.

## 2024-05-27 ENCOUNTER — Other Ambulatory Visit: Payer: Self-pay

## 2024-05-27 MED ORDER — PEN NEEDLES 32G X 4 MM MISC
2 refills | Status: AC
  Filled 2024-05-27: qty 100, 100d supply, fill #0

## 2024-05-27 MED ORDER — ALCOHOL PADS 70 % PADS
MEDICATED_PAD | 2 refills | Status: DC
  Filled 2024-05-27: qty 100, fill #0

## 2024-05-27 NOTE — Telephone Encounter (Signed)
 Needles and alcohol  pads sent to pharmacy.

## 2024-05-27 NOTE — Telephone Encounter (Signed)
 Pen needles and acholic swabs  are not on the current medication list. Patient requesting refill.

## 2024-06-07 ENCOUNTER — Other Ambulatory Visit: Payer: Self-pay

## 2024-06-22 DIAGNOSIS — R531 Weakness: Secondary | ICD-10-CM | POA: Diagnosis not present

## 2024-06-23 ENCOUNTER — Emergency Department (HOSPITAL_COMMUNITY)

## 2024-06-23 ENCOUNTER — Encounter (HOSPITAL_COMMUNITY): Payer: Self-pay

## 2024-06-23 ENCOUNTER — Other Ambulatory Visit: Payer: Self-pay

## 2024-06-23 ENCOUNTER — Inpatient Hospital Stay (HOSPITAL_COMMUNITY)
Admission: EM | Admit: 2024-06-23 | Discharge: 2024-06-25 | DRG: 872 | Discharge status: Against Medical Advice | Attending: Internal Medicine | Admitting: Internal Medicine

## 2024-06-23 DIAGNOSIS — E119 Type 2 diabetes mellitus without complications: Secondary | ICD-10-CM

## 2024-06-23 DIAGNOSIS — Z794 Long term (current) use of insulin: Secondary | ICD-10-CM

## 2024-06-23 DIAGNOSIS — F1020 Alcohol dependence, uncomplicated: Secondary | ICD-10-CM | POA: Diagnosis present

## 2024-06-23 DIAGNOSIS — K7030 Alcoholic cirrhosis of liver without ascites: Secondary | ICD-10-CM | POA: Diagnosis present

## 2024-06-23 DIAGNOSIS — G40909 Epilepsy, unspecified, not intractable, without status epilepticus: Secondary | ICD-10-CM | POA: Diagnosis present

## 2024-06-23 DIAGNOSIS — E1169 Type 2 diabetes mellitus with other specified complication: Secondary | ICD-10-CM | POA: Diagnosis not present

## 2024-06-23 DIAGNOSIS — X58XXXA Exposure to other specified factors, initial encounter: Secondary | ICD-10-CM | POA: Diagnosis present

## 2024-06-23 DIAGNOSIS — Z87891 Personal history of nicotine dependence: Secondary | ICD-10-CM

## 2024-06-23 DIAGNOSIS — I10 Essential (primary) hypertension: Secondary | ICD-10-CM | POA: Diagnosis present

## 2024-06-23 DIAGNOSIS — M86171 Other acute osteomyelitis, right ankle and foot: Secondary | ICD-10-CM | POA: Diagnosis not present

## 2024-06-23 DIAGNOSIS — F419 Anxiety disorder, unspecified: Secondary | ICD-10-CM | POA: Diagnosis present

## 2024-06-23 DIAGNOSIS — Z79899 Other long term (current) drug therapy: Secondary | ICD-10-CM

## 2024-06-23 DIAGNOSIS — D689 Coagulation defect, unspecified: Secondary | ICD-10-CM | POA: Diagnosis present

## 2024-06-23 DIAGNOSIS — M869 Osteomyelitis, unspecified: Secondary | ICD-10-CM | POA: Diagnosis present

## 2024-06-23 DIAGNOSIS — D696 Thrombocytopenia, unspecified: Secondary | ICD-10-CM | POA: Diagnosis present

## 2024-06-23 DIAGNOSIS — I4891 Unspecified atrial fibrillation: Secondary | ICD-10-CM | POA: Diagnosis present

## 2024-06-23 DIAGNOSIS — F1010 Alcohol abuse, uncomplicated: Secondary | ICD-10-CM | POA: Diagnosis present

## 2024-06-23 DIAGNOSIS — R700 Elevated erythrocyte sedimentation rate: Secondary | ICD-10-CM | POA: Diagnosis present

## 2024-06-23 DIAGNOSIS — A419 Sepsis, unspecified organism: Principal | ICD-10-CM | POA: Diagnosis present

## 2024-06-23 DIAGNOSIS — K219 Gastro-esophageal reflux disease without esophagitis: Secondary | ICD-10-CM | POA: Diagnosis present

## 2024-06-23 DIAGNOSIS — M868X7 Other osteomyelitis, ankle and foot: Principal | ICD-10-CM

## 2024-06-23 DIAGNOSIS — S8251XA Displaced fracture of medial malleolus of right tibia, initial encounter for closed fracture: Secondary | ICD-10-CM | POA: Diagnosis not present

## 2024-06-23 DIAGNOSIS — E1142 Type 2 diabetes mellitus with diabetic polyneuropathy: Secondary | ICD-10-CM | POA: Diagnosis present

## 2024-06-23 DIAGNOSIS — S93439A Sprain of tibiofibular ligament of unspecified ankle, initial encounter: Secondary | ICD-10-CM | POA: Diagnosis present

## 2024-06-23 DIAGNOSIS — Z7984 Long term (current) use of oral hypoglycemic drugs: Secondary | ICD-10-CM

## 2024-06-23 DIAGNOSIS — Z833 Family history of diabetes mellitus: Secondary | ICD-10-CM

## 2024-06-23 DIAGNOSIS — R0602 Shortness of breath: Secondary | ICD-10-CM | POA: Diagnosis not present

## 2024-06-23 DIAGNOSIS — S9304XA Dislocation of right ankle joint, initial encounter: Secondary | ICD-10-CM | POA: Diagnosis present

## 2024-06-23 DIAGNOSIS — F32A Depression, unspecified: Secondary | ICD-10-CM | POA: Diagnosis present

## 2024-06-23 DIAGNOSIS — E876 Hypokalemia: Secondary | ICD-10-CM | POA: Diagnosis present

## 2024-06-23 DIAGNOSIS — Z8614 Personal history of Methicillin resistant Staphylococcus aureus infection: Secondary | ICD-10-CM

## 2024-06-23 DIAGNOSIS — K7010 Alcoholic hepatitis without ascites: Secondary | ICD-10-CM | POA: Diagnosis present

## 2024-06-23 DIAGNOSIS — I480 Paroxysmal atrial fibrillation: Secondary | ICD-10-CM

## 2024-06-23 DIAGNOSIS — D649 Anemia, unspecified: Secondary | ICD-10-CM | POA: Diagnosis present

## 2024-06-23 DIAGNOSIS — E872 Acidosis, unspecified: Secondary | ICD-10-CM | POA: Diagnosis present

## 2024-06-23 DIAGNOSIS — M86671 Other chronic osteomyelitis, right ankle and foot: Secondary | ICD-10-CM | POA: Diagnosis present

## 2024-06-23 DIAGNOSIS — R7989 Other specified abnormal findings of blood chemistry: Secondary | ICD-10-CM | POA: Diagnosis present

## 2024-06-23 DIAGNOSIS — M85871 Other specified disorders of bone density and structure, right ankle and foot: Secondary | ICD-10-CM | POA: Diagnosis present

## 2024-06-23 DIAGNOSIS — Z7901 Long term (current) use of anticoagulants: Secondary | ICD-10-CM

## 2024-06-23 LAB — COMPREHENSIVE METABOLIC PANEL WITH GFR
ALT: 31 U/L (ref 0–44)
AST: 154 U/L — ABNORMAL HIGH (ref 15–41)
Albumin: 3.5 g/dL (ref 3.5–5.0)
Alkaline Phosphatase: 411 U/L — ABNORMAL HIGH (ref 38–126)
Anion gap: 25 — ABNORMAL HIGH (ref 5–15)
BUN: 10 mg/dL (ref 6–20)
CO2: 15 mmol/L — ABNORMAL LOW (ref 22–32)
Calcium: 8.6 mg/dL — ABNORMAL LOW (ref 8.9–10.3)
Chloride: 93 mmol/L — ABNORMAL LOW (ref 98–111)
Creatinine, Ser: 0.67 mg/dL (ref 0.61–1.24)
GFR, Estimated: >60 mL/min (ref >60)
Glucose, Bld: 162 mg/dL — ABNORMAL HIGH (ref 70–99)
Potassium: 4.1 mmol/L (ref 3.5–5.1)
Sodium: 133 mmol/L — ABNORMAL LOW (ref 135–145)
Total Bilirubin: 3.1 mg/dL — ABNORMAL HIGH (ref 0.0–1.2)
Total Protein: 8.9 g/dL — ABNORMAL HIGH (ref 6.5–8.1)

## 2024-06-23 LAB — CBC WITH DIFFERENTIAL/PLATELET
Abs Immature Granulocytes: 0.03 K/uL (ref 0.00–0.07)
Basophils Absolute: 0.1 K/uL (ref 0.0–0.1)
Basophils Relative: 1 %
Eosinophils Absolute: 0 K/uL (ref 0.0–0.5)
Eosinophils Relative: 0 %
HCT: 32 % — ABNORMAL LOW (ref 39.0–52.0)
Hemoglobin: 9.6 g/dL — ABNORMAL LOW (ref 13.0–17.0)
Immature Granulocytes: 0 %
Lymphocytes Relative: 24 %
Lymphs Abs: 2 K/uL (ref 0.7–4.0)
MCH: 24.1 pg — ABNORMAL LOW (ref 26.0–34.0)
MCHC: 30 g/dL (ref 30.0–36.0)
MCV: 80.4 fL (ref 80.0–100.0)
Monocytes Absolute: 0.7 K/uL (ref 0.1–1.0)
Monocytes Relative: 8 %
Neutro Abs: 5.5 K/uL (ref 1.7–7.7)
Neutrophils Relative %: 67 %
Platelets: 120 K/uL — ABNORMAL LOW (ref 150–400)
RBC: 3.98 MIL/uL — ABNORMAL LOW (ref 4.22–5.81)
RDW: 26.3 % — ABNORMAL HIGH (ref 11.5–15.5)
Smear Review: NORMAL
WBC: 8.3 K/uL (ref 4.0–10.5)
nRBC: 0 % (ref 0.0–0.2)

## 2024-06-23 LAB — I-STAT CG4 LACTIC ACID, ED: Lactic Acid, Venous: 4.6 mmol/L (ref 0.5–1.9)

## 2024-06-23 MED ORDER — METRONIDAZOLE 500 MG/100ML IV SOLN
500.0000 mg | Freq: Once | INTRAVENOUS | Status: AC
  Administered 2024-06-23: 500 mg via INTRAVENOUS
  Filled 2024-06-23: qty 100

## 2024-06-23 MED ORDER — LACTATED RINGERS IV SOLN: INTRAVENOUS | Status: DC

## 2024-06-23 MED ORDER — VANCOMYCIN HCL IN DEXTROSE 1-5 GM/200ML-% IV SOLN: 1000.0000 mg | Freq: Once | INTRAVENOUS | Status: DC

## 2024-06-23 MED ORDER — LACTATED RINGERS IV BOLUS (SEPSIS)
1000.0000 mL | Freq: Once | INTRAVENOUS | Status: AC
  Administered 2024-06-23: 1000 mL via INTRAVENOUS

## 2024-06-23 MED ORDER — VANCOMYCIN HCL 1250 MG/250ML IV SOLN
1250.0000 mg | Freq: Once | INTRAVENOUS | Status: AC
  Administered 2024-06-24: 1250 mg via INTRAVENOUS
  Filled 2024-06-23: qty 250

## 2024-06-23 MED ORDER — SODIUM CHLORIDE 0.9 % IV SOLN
2.0000 g | Freq: Once | INTRAVENOUS | Status: AC
  Administered 2024-06-23: 23:00:00 2 g via INTRAVENOUS
  Filled 2024-06-23: qty 12.5

## 2024-06-23 MED ORDER — LACTATED RINGERS IV BOLUS (SEPSIS)
250.0000 mL | Freq: Once | INTRAVENOUS | Status: AC
  Administered 2024-06-24: 250 mL via INTRAVENOUS

## 2024-06-23 MED ORDER — LACTATED RINGERS IV BOLUS (SEPSIS)
1000.0000 mL | Freq: Once | INTRAVENOUS | Status: AC
  Administered 2024-06-23: 23:00:00 1000 mL via INTRAVENOUS

## 2024-06-23 NOTE — Sepsis Progress Note (Signed)
 Elink monitoring for the code sepsis protocol.

## 2024-06-23 NOTE — ED Triage Notes (Signed)
 Patient BIB [facility] from home for ankle swelling and pain x 3 days. EMS reports open sore to lateral ankle, patient states to EMS that he has resided in the floor for 4 days, not taken prescribed antibiotics, and has not been performing wound care. Patient A&Ox4, 10/10 pain, feels warm to touch.  BP 138/82 HR 116 R 16 99% RA CBG 205

## 2024-06-24 ENCOUNTER — Inpatient Hospital Stay (HOSPITAL_COMMUNITY)

## 2024-06-24 DIAGNOSIS — Z7984 Long term (current) use of oral hypoglycemic drugs: Secondary | ICD-10-CM | POA: Diagnosis not present

## 2024-06-24 DIAGNOSIS — Z1621 Resistance to vancomycin: Secondary | ICD-10-CM | POA: Diagnosis not present

## 2024-06-24 DIAGNOSIS — F1020 Alcohol dependence, uncomplicated: Secondary | ICD-10-CM | POA: Diagnosis present

## 2024-06-24 DIAGNOSIS — B9562 Methicillin resistant Staphylococcus aureus infection as the cause of diseases classified elsewhere: Secondary | ICD-10-CM | POA: Diagnosis not present

## 2024-06-24 DIAGNOSIS — Z79899 Other long term (current) drug therapy: Secondary | ICD-10-CM | POA: Diagnosis not present

## 2024-06-24 DIAGNOSIS — D649 Anemia, unspecified: Secondary | ICD-10-CM | POA: Diagnosis present

## 2024-06-24 DIAGNOSIS — K7030 Alcoholic cirrhosis of liver without ascites: Secondary | ICD-10-CM

## 2024-06-24 DIAGNOSIS — R6520 Severe sepsis without septic shock: Secondary | ICD-10-CM | POA: Diagnosis not present

## 2024-06-24 DIAGNOSIS — M25571 Pain in right ankle and joints of right foot: Secondary | ICD-10-CM | POA: Diagnosis present

## 2024-06-24 DIAGNOSIS — E1142 Type 2 diabetes mellitus with diabetic polyneuropathy: Secondary | ICD-10-CM | POA: Diagnosis present

## 2024-06-24 DIAGNOSIS — E1169 Type 2 diabetes mellitus with other specified complication: Secondary | ICD-10-CM | POA: Diagnosis not present

## 2024-06-24 DIAGNOSIS — F1010 Alcohol abuse, uncomplicated: Secondary | ICD-10-CM

## 2024-06-24 DIAGNOSIS — D689 Coagulation defect, unspecified: Secondary | ICD-10-CM | POA: Diagnosis present

## 2024-06-24 DIAGNOSIS — J9600 Acute respiratory failure, unspecified whether with hypoxia or hypercapnia: Secondary | ICD-10-CM | POA: Diagnosis not present

## 2024-06-24 DIAGNOSIS — M86671 Other chronic osteomyelitis, right ankle and foot: Secondary | ICD-10-CM | POA: Diagnosis present

## 2024-06-24 DIAGNOSIS — A419 Sepsis, unspecified organism: Principal | ICD-10-CM

## 2024-06-24 DIAGNOSIS — Z794 Long term (current) use of insulin: Secondary | ICD-10-CM

## 2024-06-24 DIAGNOSIS — M86471 Chronic osteomyelitis with draining sinus, right ankle and foot: Secondary | ICD-10-CM | POA: Diagnosis not present

## 2024-06-24 DIAGNOSIS — E876 Hypokalemia: Secondary | ICD-10-CM | POA: Diagnosis not present

## 2024-06-24 DIAGNOSIS — K219 Gastro-esophageal reflux disease without esophagitis: Secondary | ICD-10-CM | POA: Diagnosis not present

## 2024-06-24 DIAGNOSIS — M86171 Other acute osteomyelitis, right ankle and foot: Secondary | ICD-10-CM | POA: Diagnosis not present

## 2024-06-24 DIAGNOSIS — B952 Enterococcus as the cause of diseases classified elsewhere: Secondary | ICD-10-CM

## 2024-06-24 DIAGNOSIS — G40909 Epilepsy, unspecified, not intractable, without status epilepticus: Secondary | ICD-10-CM | POA: Diagnosis not present

## 2024-06-24 DIAGNOSIS — X58XXXA Exposure to other specified factors, initial encounter: Secondary | ICD-10-CM | POA: Diagnosis present

## 2024-06-24 DIAGNOSIS — F32A Depression, unspecified: Secondary | ICD-10-CM | POA: Diagnosis present

## 2024-06-24 DIAGNOSIS — D696 Thrombocytopenia, unspecified: Secondary | ICD-10-CM | POA: Diagnosis not present

## 2024-06-24 DIAGNOSIS — Z7901 Long term (current) use of anticoagulants: Secondary | ICD-10-CM | POA: Diagnosis not present

## 2024-06-24 DIAGNOSIS — S9304XA Dislocation of right ankle joint, initial encounter: Secondary | ICD-10-CM | POA: Diagnosis present

## 2024-06-24 DIAGNOSIS — I4891 Unspecified atrial fibrillation: Secondary | ICD-10-CM | POA: Diagnosis present

## 2024-06-24 DIAGNOSIS — E872 Acidosis, unspecified: Secondary | ICD-10-CM | POA: Diagnosis present

## 2024-06-24 DIAGNOSIS — K7010 Alcoholic hepatitis without ascites: Secondary | ICD-10-CM | POA: Diagnosis present

## 2024-06-24 DIAGNOSIS — I10 Essential (primary) hypertension: Secondary | ICD-10-CM | POA: Diagnosis present

## 2024-06-24 DIAGNOSIS — M868X7 Other osteomyelitis, ankle and foot: Secondary | ICD-10-CM | POA: Diagnosis not present

## 2024-06-24 LAB — URINALYSIS, W/ REFLEX TO CULTURE (INFECTION SUSPECTED)
Bacteria, UA: NONE SEEN
Bilirubin Urine: NEGATIVE
Glucose, UA: NEGATIVE mg/dL
Hgb urine dipstick: NEGATIVE
Ketones, ur: 20 mg/dL — AB
Leukocytes,Ua: NEGATIVE
Nitrite: NEGATIVE
Protein, ur: 100 mg/dL — AB
Specific Gravity, Urine: 1.015 (ref 1.005–1.030)
pH: 7 (ref 5.0–8.0)

## 2024-06-24 LAB — CBG MONITORING, ED
Glucose-Capillary: 174 mg/dL — ABNORMAL HIGH (ref 70–99)
Glucose-Capillary: 167 mg/dL — ABNORMAL HIGH (ref 70–99)
Glucose-Capillary: 76 mg/dL (ref 70–99)

## 2024-06-24 LAB — I-STAT CG4 LACTIC ACID, ED
Lactic Acid, Venous: 4.7 mmol/L (ref 0.5–1.9)
Lactic Acid, Venous: 3.7 mmol/L (ref 0.5–1.9)

## 2024-06-24 LAB — CBC
HCT: 25.8 % — ABNORMAL LOW (ref 39.0–52.0)
Hemoglobin: 8 g/dL — ABNORMAL LOW (ref 13.0–17.0)
MCH: 24 pg — ABNORMAL LOW (ref 26.0–34.0)
MCHC: 31 g/dL (ref 30.0–36.0)
MCV: 77.5 fL — ABNORMAL LOW (ref 80.0–100.0)
Platelets: 88 K/uL — ABNORMAL LOW (ref 150–400)
RBC: 3.33 MIL/uL — ABNORMAL LOW (ref 4.22–5.81)
RDW: 25.8 % — ABNORMAL HIGH (ref 11.5–15.5)
WBC: 6.7 K/uL (ref 4.0–10.5)
nRBC: 0 % (ref 0.0–0.2)

## 2024-06-24 LAB — COMPREHENSIVE METABOLIC PANEL WITH GFR
ALT: 27 U/L (ref 0–44)
AST: 121 U/L — ABNORMAL HIGH (ref 15–41)
Albumin: 3.1 g/dL — ABNORMAL LOW (ref 3.5–5.0)
Alkaline Phosphatase: 343 U/L — ABNORMAL HIGH (ref 38–126)
Anion gap: 16 — ABNORMAL HIGH (ref 5–15)
BUN: 7 mg/dL (ref 6–20)
CO2: 22 mmol/L (ref 22–32)
Calcium: 8 mg/dL — ABNORMAL LOW (ref 8.9–10.3)
Chloride: 94 mmol/L — ABNORMAL LOW (ref 98–111)
Creatinine, Ser: 0.46 mg/dL — ABNORMAL LOW (ref 0.61–1.24)
GFR, Estimated: >60 mL/min (ref >60)
Glucose, Bld: 191 mg/dL — ABNORMAL HIGH (ref 70–99)
Potassium: 3.7 mmol/L (ref 3.5–5.1)
Sodium: 133 mmol/L — ABNORMAL LOW (ref 135–145)
Total Bilirubin: 2.4 mg/dL — ABNORMAL HIGH (ref 0.0–1.2)
Total Protein: 7.5 g/dL (ref 6.5–8.1)

## 2024-06-24 LAB — PROCALCITONIN: Procalcitonin: 0.16 ng/mL

## 2024-06-24 LAB — BLOOD GAS, VENOUS
Acid-Base Excess: 2 mmol/L (ref 0.0–2.0)
Bicarbonate: 26.5 mmol/L (ref 20.0–28.0)
Drawn by: 72300
O2 Saturation: 86.8 %
Patient temperature: 36.9
pCO2, Ven: 40 mmHg — ABNORMAL LOW (ref 44–60)
pH, Ven: 7.43 (ref 7.25–7.43)
pO2, Ven: 58 mmHg — ABNORMAL HIGH (ref 32–45)

## 2024-06-24 LAB — URINALYSIS, ROUTINE W REFLEX MICROSCOPIC
Bacteria, UA: NONE SEEN
Bilirubin Urine: NEGATIVE
Glucose, UA: NEGATIVE mg/dL
Ketones, ur: 20 mg/dL — AB
Leukocytes,Ua: NEGATIVE
Nitrite: NEGATIVE
Protein, ur: NEGATIVE mg/dL
Specific Gravity, Urine: 1.01 (ref 1.005–1.030)
pH: 7 (ref 5.0–8.0)

## 2024-06-24 LAB — HEMOGLOBIN A1C
Hgb A1c MFr Bld: 6.9 % — ABNORMAL HIGH (ref 4.8–5.6)
Mean Plasma Glucose: 151.33 mg/dL

## 2024-06-24 LAB — LACTIC ACID, PLASMA
Lactic Acid, Venous: 3.2 mmol/L (ref 0.5–1.9)
Lactic Acid, Venous: 3 mmol/L (ref 0.5–1.9)

## 2024-06-24 LAB — IRON AND TIBC
Iron: 45 ug/dL (ref 45–182)
Saturation Ratios: 14 % — ABNORMAL LOW (ref 17.9–39.5)
TIBC: 322 ug/dL (ref 250–450)
UIBC: 277 ug/dL

## 2024-06-24 LAB — C-REACTIVE PROTEIN: CRP: <3 mg/dL — ABNORMAL HIGH (ref <1.0)

## 2024-06-24 LAB — LIPASE, BLOOD: Lipase: 11 U/L (ref 11–51)

## 2024-06-24 LAB — FERRITIN: Ferritin: 29 ng/mL (ref 24–336)

## 2024-06-24 LAB — SEDIMENTATION RATE: Sed Rate: 38 mm/h — ABNORMAL HIGH (ref 0–16)

## 2024-06-24 LAB — MAGNESIUM: Magnesium: 0.9 mg/dL — CL (ref 1.7–2.4)

## 2024-06-24 LAB — TSH: TSH: 1.63 u[IU]/mL (ref 0.350–4.500)

## 2024-06-24 MED ORDER — THIAMINE MONONITRATE 100 MG PO TABS
100.0000 mg | ORAL_TABLET | Freq: Every day | ORAL | Status: DC
  Administered 2024-06-25: 100 mg via ORAL
  Filled 2024-06-24: qty 1

## 2024-06-24 MED ORDER — INSULIN GLARGINE 100 UNIT/ML ~~LOC~~ SOLN
20.0000 [IU] | Freq: Every day | SUBCUTANEOUS | Status: DC
  Administered 2024-06-24 – 2024-06-25 (×2): 20 [IU] via SUBCUTANEOUS
  Filled 2024-06-24 (×3): qty 0.2

## 2024-06-24 MED ORDER — ACETAMINOPHEN 325 MG PO TABS: 650.0000 mg | ORAL_TABLET | Freq: Four times a day (QID) | ORAL | Status: DC | PRN

## 2024-06-24 MED ORDER — ONDANSETRON HCL 4 MG PO TABS: 4.0000 mg | ORAL_TABLET | Freq: Four times a day (QID) | ORAL | Status: DC | PRN

## 2024-06-24 MED ORDER — SODIUM CHLORIDE 0.9 % IV SOLN
3.0000 g | Freq: Four times a day (QID) | INTRAVENOUS | Status: DC
  Administered 2024-06-24 – 2024-06-25 (×6): 3 g via INTRAVENOUS
  Filled 2024-06-24 (×6): qty 8

## 2024-06-24 MED ORDER — CEFEPIME HCL 2 G IV SOLR
2.0000 g | Freq: Three times a day (TID) | INTRAVENOUS | Status: DC
  Administered 2024-06-24: 06:00:00 2 g via INTRAVENOUS
  Filled 2024-06-24: qty 12.5

## 2024-06-24 MED ORDER — LORAZEPAM 1 MG PO TABS: 0.0000 mg | ORAL_TABLET | Freq: Two times a day (BID) | ORAL | Status: DC

## 2024-06-24 MED ORDER — ONDANSETRON HCL 4 MG/2ML IJ SOLN
4.0000 mg | Freq: Once | INTRAMUSCULAR | Status: AC
  Administered 2024-06-24: 4 mg via INTRAVENOUS
  Filled 2024-06-24: qty 2

## 2024-06-24 MED ORDER — HEPARIN SODIUM (PORCINE) 5000 UNIT/ML IJ SOLN
5000.0000 [IU] | Freq: Three times a day (TID) | INTRAMUSCULAR | Status: DC
  Administered 2024-06-24 – 2024-06-25 (×6): 5000 [IU] via SUBCUTANEOUS
  Filled 2024-06-24 (×6): qty 1

## 2024-06-24 MED ORDER — ONDANSETRON HCL 4 MG/2ML IJ SOLN
4.0000 mg | Freq: Once | INTRAMUSCULAR | Status: AC
  Administered 2024-06-24: 04:00:00 4 mg via INTRAVENOUS
  Filled 2024-06-24: qty 2

## 2024-06-24 MED ORDER — ATORVASTATIN CALCIUM 10 MG PO TABS
20.0000 mg | ORAL_TABLET | Freq: Every day | ORAL | Status: DC
  Administered 2024-06-24 – 2024-06-25 (×2): 20 mg via ORAL
  Filled 2024-06-24 (×2): qty 2

## 2024-06-24 MED ORDER — MAGNESIUM SULFATE 50 % IJ SOLN
6.0000 g | Freq: Once | INTRAVENOUS | Status: AC
  Administered 2024-06-24: 12:00:00 6 g via INTRAVENOUS
  Filled 2024-06-24: qty 10

## 2024-06-24 MED ORDER — ADULT MULTIVITAMIN W/MINERALS CH: 1.0000 | ORAL_TABLET | Freq: Every day | ORAL | Status: DC

## 2024-06-24 MED ORDER — PANTOPRAZOLE SODIUM 40 MG PO TBEC
40.0000 mg | DELAYED_RELEASE_TABLET | Freq: Two times a day (BID) | ORAL | Status: DC
  Administered 2024-06-24 – 2024-06-25 (×4): 40 mg via ORAL
  Filled 2024-06-24 (×4): qty 1

## 2024-06-24 MED ORDER — THIAMINE HCL 100 MG/ML IJ SOLN: 100.0000 mg | Freq: Every day | INTRAMUSCULAR | Status: DC

## 2024-06-24 MED ORDER — INSULIN ASPART 100 UNIT/ML IJ SOLN
10.0000 [IU] | Freq: Three times a day (TID) | INTRAMUSCULAR | Status: DC
  Filled 2024-06-24: qty 10

## 2024-06-24 MED ORDER — FENTANYL CITRATE (PF) 50 MCG/ML IJ SOSY
50.0000 ug | PREFILLED_SYRINGE | Freq: Once | INTRAMUSCULAR | Status: AC
  Administered 2024-06-24: 50 ug via INTRAVENOUS
  Filled 2024-06-24: qty 1

## 2024-06-24 MED ORDER — PIPERACILLIN-TAZOBACTAM 3.375 G IVPB: 3.3750 g | Freq: Three times a day (TID) | INTRAVENOUS | Status: DC

## 2024-06-24 MED ORDER — GADOBUTROL 1 MMOL/ML IV SOLN
7.0000 mL | Freq: Once | INTRAVENOUS | Status: AC | PRN
  Administered 2024-06-24: 06:00:00 7 mL via INTRAVENOUS

## 2024-06-24 MED ORDER — VANCOMYCIN HCL IN DEXTROSE 1-5 GM/200ML-% IV SOLN: 1000.0000 mg | Freq: Two times a day (BID) | INTRAVENOUS | Status: DC

## 2024-06-24 MED ORDER — LORAZEPAM 1 MG PO TABS
0.0000 mg | ORAL_TABLET | Freq: Four times a day (QID) | ORAL | Status: DC
  Filled 2024-06-24 (×2): qty 1

## 2024-06-24 MED ORDER — METRONIDAZOLE 500 MG/100ML IV SOLN: 500.0000 mg | Freq: Two times a day (BID) | INTRAVENOUS | Status: DC

## 2024-06-24 MED ORDER — ACETAMINOPHEN 650 MG RE SUPP: 650.0000 mg | Freq: Four times a day (QID) | RECTAL | Status: DC | PRN

## 2024-06-24 MED ORDER — FOLIC ACID 1 MG PO TABS
1.0000 mg | ORAL_TABLET | Freq: Every day | ORAL | Status: DC
  Administered 2024-06-24 – 2024-06-25 (×2): 1 mg via ORAL
  Filled 2024-06-24 (×2): qty 1

## 2024-06-24 MED ORDER — DAPTOMYCIN-SODIUM CHLORIDE 500-0.9 MG/50ML-% IV SOLN
8.0000 mg/kg | Freq: Every day | INTRAVENOUS | Status: DC
  Administered 2024-06-24 – 2024-06-25 (×2): 500 mg via INTRAVENOUS
  Filled 2024-06-24 (×2): qty 50

## 2024-06-24 MED ORDER — MAGNESIUM SULFATE 4 GM/100ML IV SOLN: 4.0000 g | Freq: Once | INTRAVENOUS | Status: DC

## 2024-06-24 MED ORDER — LACTATED RINGERS IV SOLN: INTRAVENOUS | Status: DC

## 2024-06-24 MED ORDER — ADULT MULTIVITAMIN W/MINERALS CH
1.0000 | ORAL_TABLET | Freq: Every day | ORAL | Status: DC
  Administered 2024-06-24 – 2024-06-25 (×2): 1 via ORAL
  Filled 2024-06-24 (×2): qty 1

## 2024-06-24 MED ORDER — LORAZEPAM 2 MG/ML IJ SOLN: 1.0000 mg | INTRAMUSCULAR | Status: DC | PRN

## 2024-06-24 MED ORDER — ONDANSETRON HCL 4 MG/2ML IJ SOLN: 4.0000 mg | Freq: Four times a day (QID) | INTRAMUSCULAR | Status: DC | PRN

## 2024-06-24 MED ORDER — LORAZEPAM 1 MG PO TABS
1.0000 mg | ORAL_TABLET | ORAL | Status: DC | PRN
  Administered 2024-06-25: 1 mg via ORAL

## 2024-06-24 MED ORDER — LEVETIRACETAM 500 MG PO TABS
1000.0000 mg | ORAL_TABLET | Freq: Two times a day (BID) | ORAL | Status: DC
  Administered 2024-06-24 – 2024-06-25 (×4): 1000 mg via ORAL
  Filled 2024-06-24 (×4): qty 2

## 2024-06-24 MED ORDER — FENTANYL CITRATE (PF) 50 MCG/ML IJ SOSY
50.0000 ug | PREFILLED_SYRINGE | INTRAMUSCULAR | Status: DC | PRN
  Administered 2024-06-24 – 2024-06-25 (×5): 50 ug via INTRAVENOUS
  Filled 2024-06-24 (×5): qty 1

## 2024-06-24 MED ORDER — INSULIN ASPART 100 UNIT/ML IJ SOLN
0.0000 [IU] | Freq: Three times a day (TID) | INTRAMUSCULAR | Status: DC
  Administered 2024-06-24 – 2024-06-25 (×4): 1 [IU] via SUBCUTANEOUS
  Filled 2024-06-24 (×3): qty 1

## 2024-06-24 MED ORDER — METOPROLOL SUCCINATE ER 25 MG PO TB24
12.5000 mg | ORAL_TABLET | Freq: Every day | ORAL | Status: DC
  Administered 2024-06-24 – 2024-06-25 (×2): 12.5 mg via ORAL
  Filled 2024-06-24 (×2): qty 1

## 2024-06-24 MED ADMIN — Empagliflozin Tab 10 MG: 10 mg | ORAL | @ 12:00:00 | NDC 00597015230

## 2024-06-24 MED ADMIN — Thiamine Mononitrate Tab 100 MG: 100 mg | ORAL | @ 10:00:00 | NDC 54629005701

## 2024-06-24 MED FILL — Empagliflozin Tab 10 MG: 10.0000 mg | ORAL | Qty: 1 | Status: AC

## 2024-06-24 MED FILL — Thiamine Mononitrate Tab 100 MG: 100.0000 mg | ORAL | Qty: 1 | Status: AC

## 2024-06-24 MED FILL — Thiamine HCl Tab 100 MG: 100.0000 mg | ORAL | Qty: 1 | Status: AC

## 2024-06-24 NOTE — Progress Notes (Incomplete)
 I have seen and assessed patient and agree with Dr. Marliss assessment and plan.

## 2024-06-24 NOTE — H&P (Addendum)
 History and Physical    Gary Mitchell FMW:[id number] DOB: 05-Dec-1970 DOA: 06/23/2024  PCP: Delbert Clam, MD  Patient coming from: home  I have personally briefly reviewed patient's old medical records in [facility]  Chief Complaint: ankle swelling  and pain x 3 days right lower ext  HPI: Gary Mitchell is a 53 y.o. male with medical history significant of chronic osteomyelitis of the right ankle, chronic alcohol  abuse, poorly controlled type 2 diabetes mellitus, cirrhosis, Atrial fibrillation,Depression, anxiety , anemia, neuropathy lower extremities, hx of Seizure d/o ,as well as thrombocytopenia. Patient has interim history of admission 10/17-10/25 with diagnosis of chronic osteomyelitis of right ankle. At that  time patient was seen by orthopedics and per notes was empathic regarding not having an amputation. At that time patient was discharged on Augmentin  and doxycycline  with end dated fo 10/30. Patient states last 4 days feeling poorly, having nausea, note eating and weak.  He notes no fever/chills But does note increase pain and swelling to right ankle.   ED Course:  Vitals: afeb bp 128/83, hr 110, rr 16, sat 97%  Na 133, K 4.1, Cl 93, bicarb 15, glu 162, cr 0.67, AST 154, Alphos 411 AG 25, bicarb 15,  glucose 162  Lactic acidosis  Cxr: NAD Wbc 8.3,  hgb 9.6 ( at baseilne)  plt 120 ( 145) Xray ankle  IMPRESSION: 1. Progressive osteomyelitis involving the distal tibia, fibula, and medial malleolus, with involvement of the talar dome. Progressive periprosthetic erosion. Persistent changes in keeping with disruption of the syndesmotic reconstruction. 2. Stable lateral dislocation of the talar dome relative to the tibial plafond with stable lateral subluxation at the tibiotalar articulation. 3. Extensive surrounding soft tissue swelling with a lateral soft tissue wound and superficial ulceration adjacent to the distal tibial callus. 4. Diffuse osteopenia.   tX vanc ,  cefepime , metronidazole  Review of Systems: As per HPI otherwise 10 point review of systems negative.   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   Seizures (HCC)    Seizures (HCC)    Thrombocytopenia     Past Surgical History:  Procedure Laterality Date   BIOPSY  08/02/2021   Procedure: BIOPSY;  Surgeon: Teressa Toribio SQUIBB, MD;  Location: [facility];  Service: Endoscopy;;   BIOPSY  05/07/2022   Procedure: BIOPSY;  Surgeon: Stacia Glendia BRAVO, MD;  Location: [facility];  Service: Gastroenterology;;   BONE BIOPSY  03/01/2024   Procedure: BIOPSY, GI;  Surgeon: Leigh Elspeth SQUIBB, MD;  Location: [facility] ENDOSCOPY;  Service: Gastroenterology;;   COLONOSCOPY WITH PROPOFOL  N/A 05/08/2022   Procedure: COLONOSCOPY WITH PROPOFOL ;  Surgeon: Stacia Glendia BRAVO, MD;  Location: [facility];  Service: Gastroenterology;  Laterality: N/A;   ENTEROSCOPY N/A 09/20/2020   Procedure: ENTEROSCOPY;  Surgeon: Eda Iha, MD;  Location: [facility];  Service: Gastroenterology;  Laterality: N/A;   ENTEROSCOPY N/A 05/11/2022   Procedure: ENTEROSCOPY;  Surgeon: Stacia Glendia BRAVO, MD;  Location: [facility];  Service: Gastroenterology;  Laterality: N/A;   ESOPHAGOGASTRODUODENOSCOPY N/A 03/01/2024   Procedure: EGD (ESOPHAGOGASTRODUODENOSCOPY);  Surgeon: Leigh Elspeth SQUIBB, MD;  Location: [facility] ENDOSCOPY;  Service:  Gastroenterology;  Laterality: N/A;   ESOPHAGOGASTRODUODENOSCOPY (EGD) WITH PROPOFOL  N/A 08/02/2021   Procedure: ESOPHAGOGASTRODUODENOSCOPY (EGD) WITH PROPOFOL ;  Surgeon: Teressa Toribio SQUIBB, MD;  Location: [facility];  Service: Endoscopy;  Laterality: N/A;   ESOPHAGOGASTRODUODENOSCOPY (EGD) WITH PROPOFOL  N/A 01/31/2022   Procedure: ESOPHAGOGASTRODUODENOSCOPY (EGD) WITH PROPOFOL ;  Surgeon: Teressa Toribio SQUIBB, MD;  Location: [facility];  Service: Gastroenterology;  Laterality: N/A;   ESOPHAGOGASTRODUODENOSCOPY (EGD) WITH PROPOFOL  N/A 05/07/2022   Procedure: ESOPHAGOGASTRODUODENOSCOPY (EGD) WITH PROPOFOL ;  Surgeon: Stacia Glendia BRAVO, MD;  Location: [facility];  Service: Gastroenterology;  Laterality: N/A;   ESOPHAGOGASTRODUODENOSCOPY (EGD) WITH PROPOFOL  N/A 06/13/2022   Procedure: ESOPHAGOGASTRODUODENOSCOPY (EGD) WITH PROPOFOL ;  Surgeon: Wilhelmenia Aloha Raddle., MD;  Location: [facility];  Service: Gastroenterology;  Laterality: N/A;   EUS N/A 01/31/2022   Procedure: UPPER ENDOSCOPIC ULTRASOUND (EUS) RADIAL;  Surgeon: Teressa Toribio SQUIBB, MD;  Location: [facility];  Service: Gastroenterology;  Laterality: N/A;   EUS N/A 06/13/2022   Procedure: UPPER ENDOSCOPIC ULTRASOUND (EUS) LINEAR;  Surgeon: Wilhelmenia Aloha Raddle., MD;  Location: [facility];  Service: Gastroenterology;  Laterality: N/A;   FINE NEEDLE ASPIRATION N/A 08/02/2021   Procedure: FINE NEEDLE ASPIRATION (FNA) LINEAR;  Surgeon: Teressa Toribio SQUIBB, MD;  Location: [facility];  Service: Endoscopy;  Laterality: N/A;   FINE NEEDLE ASPIRATION N/A 01/31/2022   Procedure: FINE NEEDLE ASPIRATION (FNA) LINEAR;  Surgeon: Teressa Toribio SQUIBB, MD;  Location: [facility];  Service: Gastroenterology;  Laterality: N/A;   FINE NEEDLE ASPIRATION N/A 06/13/2022   Procedure: FINE NEEDLE ASPIRATION (FNA) LINEAR;  Surgeon: Wilhelmenia Aloha Raddle., MD;  Location: [facility];  Service: Gastroenterology;  Laterality: N/A;   GIVENS CAPSULE STUDY N/A 05/09/2022    Procedure: GIVENS CAPSULE STUDY;  Surgeon: Stacia Glendia BRAVO, MD;  Location: [facility];  Service: Gastroenterology;  Laterality: N/A;   HARDWARE REMOVAL Right 02/04/2024   Procedure: REMOVAL, HARDWARE;  Surgeon: Harden Jerona GAILS, MD;  Location: [facility] OR;  Service: Orthopedics;  Laterality: Right;  HARDWARE REMOVAL RIGHT ANKLE   HEMOSTASIS CLIP PLACEMENT  05/08/2022   Procedure: HEMOSTASIS CLIP PLACEMENT;  Surgeon: Stacia Glendia BRAVO, MD;  Location: [facility];  Service: Gastroenterology;;   HEMOSTASIS CLIP PLACEMENT  05/11/2022   Procedure: HEMOSTASIS CLIP PLACEMENT;  Surgeon: Stacia Glendia BRAVO, MD;  Location: [facility];  Service: Gastroenterology;;   HEMOSTASIS CLIP PLACEMENT  03/01/2024   Procedure: CONTROL OF HEMORRHAGE, GI TRACT, ENDOSCOPIC, BY CLIPPING OR OVERSEWING;  Surgeon: Leigh Elspeth SQUIBB, MD;  Location: [facility];  Service: Gastroenterology;;   HOT HEMOSTASIS N/A 06/13/2022   Procedure: HOT HEMOSTASIS (ARGON PLASMA COAGULATION/BICAP);  Surgeon: Wilhelmenia Aloha Raddle., MD;  Location: [facility];  Service: Gastroenterology;  Laterality: N/A;   left kidney removed     ORIF ANKLE FRACTURE Right 01/06/2024   Procedure: OPEN REDUCTION INTERNAL FIXATION (ORIF) ANKLE FRACTURE;  Surgeon: Beverley Evalene BIRCH, MD;  Location: [facility];  Service: Orthopedics;  Laterality: Right;   POLYPECTOMY  05/08/2022   Procedure: POLYPECTOMY;  Surgeon: Stacia Glendia BRAVO, MD;  Location: [facility];  Service: Gastroenterology;;   UPPER ESOPHAGEAL ENDOSCOPIC ULTRASOUND (EUS) N/A 08/02/2021   Procedure: UPPER ESOPHAGEAL ENDOSCOPIC ULTRASOUND (EUS);  Surgeon: Teressa Toribio SQUIBB, MD;  Location: [facility];  Service:  Endoscopy;  Laterality: N/A;  Radial and Linear     reports that he quit smoking about 6 years ago. His smoking use included cigarettes. He started smoking about 31 years ago. He has a 2.5 pack-year smoking history. He has been exposed to tobacco smoke. He has never used smokeless tobacco. He  reports that he does not currently use alcohol . He reports that he does not currently use drugs.  Allergies[1]  Family History  Problem Relation Age of Onset   Diabetes Mellitus II Mother    Colon cancer Neg Hx    Esophageal cancer Neg Hx    Inflammatory bowel disease Neg Hx    Liver disease Neg Hx    Pancreatic cancer Neg Hx    Rectal cancer Neg Hx    Stomach cancer Neg Hx     Prior to Admission medications  Medication Sig Start Date End Date Taking? Authorizing Provider  acetaminophen  (TYLENOL ) 500 MG tablet Take 2,000 mg by mouth 2 (two) times daily as needed for moderate pain (pain score 4-6), fever or headache.   Yes [provider]  empagliflozin  (JARDIANCE ) 10 MG TABS tablet TAKE 1 TABLET BY MOUTH ONCE DAILY BEFORE BREAKFAST *NEW PRESCRIPTION REQUEST* 05/18/24  Yes Newlin, Enobong, MD  insulin  glargine (LANTUS  SOLOSTAR) 100 UNIT/ML Solostar Pen INJECT 32 UNITS SUBCUTANEOUSLY ONCE DAILY *NEW PRESCRIPTION REQUEST* 05/18/24  Yes Newlin, Enobong, MD  insulin  lispro (HUMALOG  KWIKPEN) 100 UNIT/ML KwikPen Inject 10 Units into the skin 3 (three) times daily. 05/18/24  Yes Newlin, Corrina, MD  levETIRAcetam  (KEPPRA ) 1000 MG tablet TAKE 1 TABLET(S) BY MOUTH 2 TIMES PER DAY *NEW PRESCRIPTION REQUEST* 05/18/24  Yes Newlin, Enobong, MD  metFORMIN  (GLUCOPHAGE ) 500 MG tablet TAKE 2 TABLET(S) BY MOUTH 2 TIMES PER DAY *NEW PRESCRIPTION REQUEST* 05/18/24  Yes Newlin, Corrina, MD  metoprolol  succinate (TOPROL -XL) 25 MG 24 hr tablet TAKE 1/2 (ONE-HALF) TABLET BY MOUTH ONCE DAILY *NEW PRESCRIPTION REQUEST* 05/18/24  Yes Newlin, Enobong, MD  pantoprazole  (PROTONIX ) 40 MG tablet TAKE 1 TABLET(S) BY MOUTH 2 TIMES PER DAY *NEW PRESCRIPTION REQUEST* 05/18/24  Yes Newlin, Enobong, MD  Alcohol  Swabs  (ALCOHOL  PADS) 70 % PADS Clean skin before every injection 05/27/24   Delbert Corrina, MD  apixaban  (ELIQUIS ) 2.5 MG TABS tablet Take 1 tablet (2.5 mg total) by mouth 2 (two) times daily for 28  days. Patient not taking: Reported on 06/24/2024 01/08/24 07/07/24  Drusilla Sabas RAMAN, MD  atorvastatin  (LIPITOR) 20 MG tablet Take 1 tablet (20 mg total) by mouth daily. Patient not taking: Reported on 06/24/2024 04/02/23   Laurence Locus, DO  Continuous Glucose Sensor (FREESTYLE LIBRE 3 PLUS SENSOR) MISC Change sensor every 15 days. Use to check blood sugar continuously. 05/24/24   Newlin, Enobong, MD  folic acid  (FOLVITE ) 1 MG tablet TAKE 1 TABLET(S) BY MOUTH 1 TIMES PER DAY *NEW PRESCRIPTION REQUEST* Patient not taking: Reported on 06/24/2024 05/18/24   Newlin, Enobong, MD  Insulin  Pen Needle (PEN NEEDLES) 32G X 4 MM MISC Use to inject insulin  once daily. 05/27/24   Newlin, Enobong, MD  Multiple Vitamin (MULTIVITAMIN) TABS TAKE 1 TABLET(S) BY MOUTH 1 TIMES PER DAY *NEW PRESCRIPTION REQUEST* Patient not taking: Reported on 06/24/2024 05/18/24   Newlin, Enobong, MD  thiamine  (VITAMIN B1) 100 MG tablet Take 1 tablet (100 mg total) by mouth daily. 05/24/24   Delbert Corrina, MD    Physical Exam: Vitals:   06/24/24 0100 06/24/24 0152 06/24/24 0352 06/24/24 0400  BP: 126/76   (!) 143/82  Pulse: ROLLEN)  102   95  Resp: 20   12  Temp:  98.2 F (36.8 C) 98.3 F (36.8 C)   TempSrc:  Oral Oral   SpO2: 99%   100%    Constitutional: NAD, calm, comfortable Vitals:   06/24/24 0100 06/24/24 0152 06/24/24 0352 06/24/24 0400  BP: 126/76   (!) 143/82  Pulse: (!) 102   95  Resp: 20   12  Temp:  98.2 F (36.8 C) 98.3 F (36.8 C)   TempSrc:  Oral Oral   SpO2: 99%   100%   Eyes: PERRL, lids and conjunctivae normal ENMT: Mucous membranes are moist. Posterior pharynx clear of any exudate or lesions.Normal dentition.  Neck: normal, supple, no masses, no thyromegaly Respiratory: clear to auscultation bilaterally, no wheezing, no crackles. Normal respiratory effort. No accessory muscle use.  Cardiovascular: Regular rate and rhythm, no murmurs / rubs / gallops. No extremity edema. 2+ pedal pulses.  Abdomen: no  tenderness, no masses palpated. No hepatosplenomegaly. Bowel sounds positive.  Musculoskeletal: no clubbing / cyanosis. No joint deformity upper and lower extremities. Good ROM, no contractures. Normal muscle tone.  Right ankle swollen and tender  Skin: no rashes, lesions, ulcers. No induration Neurologic: CN 2-12 grossly intact. Sensation intact, Strength 5/5 in all 4.  Psychiatric: Alert and oriented x 3. Normal mood.    Labs on Admission: I have personally reviewed following labs and imaging studies  CBC: Recent Labs  Lab 06/23/24 2107  WBC 8.3  NEUTROABS 5.5  HGB 9.6*  HCT 32.0*  MCV 80.4  PLT 120*   Basic Metabolic Panel: Recent Labs  Lab 06/23/24 2107  NA 133*  K 4.1  CL 93*  CO2 15*  GLUCOSE 162*  BUN 10  CREATININE 0.67  CALCIUM  8.6*   GFR: CrCl cannot be calculated (Unknown ideal weight.). Liver Function Tests: Recent Labs  Lab 06/23/24 2107  AST 154*  ALT 31  ALKPHOS 411*  BILITOT 3.1*  PROT 8.9*  ALBUMIN 3.5   No results for input(s): LIPASE, AMYLASE in the last 168 hours. No results for input(s): AMMONIA in the last 168 hours. Coagulation Profile: No results for input(s): INR, PROTIME in the last 168 hours. Cardiac Enzymes: No results for input(s): CKTOTAL, CKMB, CKMBINDEX, TROPONINI in the last 168 hours. BNP (last 3 results) No results for input(s): PROBNP in the last 8760 hours. HbA1C: No results for input(s): HGBA1C in the last 72 hours. CBG: No results for input(s): GLUCAP in the last 168 hours. Lipid Profile: No results for input(s): CHOL, HDL, LDLCALC, TRIG, CHOLHDL, LDLDIRECT in the last 72 hours. Thyroid  Function Tests: No results for input(s): TSH, T4TOTAL, FREET4, T3FREE, THYROIDAB in the last 72 hours. Anemia Panel: No results for input(s): VITAMINB12, FOLATE, FERRITIN, TIBC, IRON , RETICCTPCT in the last 72 hours. Urine analysis:    Component Value Date/Time    COLORURINE AMBER (A) 06/23/2024 2022   APPEARANCEUR CLEAR 06/23/2024 2022   LABSPEC 1.015 06/23/2024 2022   PHURINE 7.0 06/23/2024 2022   GLUCOSEU NEGATIVE 06/23/2024 2022   HGBUR NEGATIVE 06/23/2024 2022   BILIRUBINUR NEGATIVE 06/23/2024 2022   KETONESUR 20 (A) 06/23/2024 2022   PROTEINUR 100 (A) 06/23/2024 2022   NITRITE NEGATIVE 06/23/2024 2022   LEUKOCYTESUR NEGATIVE 06/23/2024 2022    Radiological Exams on Admission: DG Ankle Complete Right Result Date: 06/23/2024 EXAM: 3 OR MORE VIEW(S) XRAY OF THE ANKLE 06/23/2024 11:43:13 PM CLINICAL HISTORY: Chronic osteo, worsening pain, sepsis COMPARISON: 04/23/2024 FINDINGS: BONES AND JOINTS: Stable ORIF of medial  malleolus. Extensive bony destruction of distal tibia and fibula. Fragmentation of medial malleolus similar to previous exam. Increasing periprosthetic lucencies surrounding the lag screws within the medial malleolus. Displacement of the retaining button along the medial cortex of the distal tibia in keeping with disruption of the type of reconstruction. Increasing erosion along the fracture margins, within the talar dome, and within the distal tibia and medial malleolar fracture fragments in keeping with progressive changes of osteomyelitis. Osseous structures are diffusely osteopenic in keeping with disease. Stable lateral dislocation of the talar dome in relation to the tibial plafond. Stable lateral subluxation at tibiotalar articulation. SOFT TISSUES: Extensive surrounding soft tissue swelling. Soft tissue wound now seen with superficial ulceration lateral tibia distal tibial callus. IMPRESSION: 1. Progressive osteomyelitis involving the distal tibia, fibula, and medial malleolus, with involvement of the talar dome. Progressive periprosthetic erosion. Persistent changes in keeping with disruption of the syndesmotic reconstruction. 2. Stable lateral dislocation of the talar dome relative to the tibial plafond with stable lateral subluxation  at the tibiotalar articulation. 3. Extensive surrounding soft tissue swelling with a lateral soft tissue wound and superficial ulceration adjacent to the distal tibial callus. 4. Diffuse osteopenia. Electronically signed by: Dorethia Molt MD 06/23/2024 11:55 PM EST RP Workstation: [id number]   DG Chest Port 1 View Result Date: 06/23/2024 EXAM: 1 VIEW(S) XRAY OF THE CHEST 06/23/2024 11:37:00 PM COMPARISON: 02/28/2024 CLINICAL HISTORY: SOB (shortness of breath) FINDINGS: LUNGS AND PLEURA: No focal pulmonary opacity. No pleural effusion. No pneumothorax. HEART AND MEDIASTINUM: No acute abnormality of the cardiac and mediastinal silhouettes. BONES AND SOFT TISSUES: No acute osseous abnormality. IMPRESSION: 1. No acute cardiopulmonary abnormality. Electronically signed by: Dorethia Molt MD 06/23/2024 11:48 PM EST RP Workstation: [id number]    EKG: Independently reviewed.  Assessment/Plan  Progressive Chronic Osteomyelitis of Right lower extremity  Sepsis  - admit to progressive care  - start on broad spectrum abx  - I D consult  - orthopedic consult  -check crp, esr, procal  -lactic 4.7 continue to trend  - continue with ivfs   Metabolic acidosis  - due to lactic acidosis  -continue ivfs -vbg to be complete   ETOH abuse -place on ciwa  - mvi, thiamine  folate    ETOH liver disease with Cirrhosis -hx of elevated lfts  -continue to monitor noted trend up wards ,related to etoh / infection    Anemia - at baseline   DM -fs 160's -resume lantus  at lower dose titrate to home dose as able  -iss/fs    HTN -resume metoprolol  as bp tolerates   Seizure d/o  -continue on keppra  bid   GERD -ppi   DVT prophylaxis: heparin  Code Status: full/ as discussed per patient wishes in event of cardiac arrest  Family Communication: none at bedside  Disposition Plan: patient  expected to be admitted greater than 2 midnights  Consults called:  ID -[facility]  Orthopedics (Please call ortho care in  am ) Admission status: progressive   Camila DELENA Ned MD [facility]   If 7PM-7AM, please contact night-coverage [email or web address] Password [email or web address]  06/24/2024, 4:14 AM        [1]  Allergies Allergen Reactions   Iohexol  Other (See Comments)    Unknown reaction at 60 days old Mom at bedside reported patient was given injections of iohexol  in foot and resulted in hole in foot or foot got infected   Nsaids Other (See Comments)    Patient said he was told he could take Tylenol  ONLY.

## 2024-06-24 NOTE — Sepsis Progress Note (Signed)
 Notified bedside nurse of need to draw repeat lactic acid.

## 2024-06-24 NOTE — Sepsis Progress Note (Signed)
 Notified provider of need to order repeat lactic acid.

## 2024-06-24 NOTE — Consult Note (Signed)
 Orthopedic Consultation Note  Current Hospital Day : Hospital Day: 2  Reason For Consult: Right ankle infection  History of Present Illness:  NINO AMANO is a 53 y.o. male who presents to the hospital due to feeling weak, nauseous, increasing ankle pain after running out of p.o. antibiotics.  He has a history of right ankle reduction internal fixation by Dr. Beverley in July and subsequent removal of hardware by Dr. Harden.  He has previously been seen 2 months ago by Dr. Due to where below-knee amputation was recommended but patient was opposed to that approach.  He was discharged on p.o. antibiotics.  Presently endorses some mild right ankle pain.  He states he is normally able to walk on his ankle but not very much.  Physical Examination Right Lower Extremity: Diffuse swelling Open wound at the proximal aspect of his lateral incision Able to grossly move the toes Foot wwp   Imaging: X-rays and MRI of the right ankle reviewed and interpreted demonstrating nonunited and displaced bimalleolar ankle fracture with lucency concerning for osteomyelitis  Assessment:   CURTIS CAIN is a 53 y.o. 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  Presently he is normotensive, afebrile, normal white blood cell count.  Blood cultures are pending  I discussed with the patient that I personally do not have a surgical option to recommend to him aside from a below-knee amputation.  I do not know of any reconstructive options for the ankle with osteomyelitis that would provide him with any meaningful improvement in function and eradication of his infection.  We discussed that a below-knee amputation is not urgent at the moment but it may become so if his infection progresses.  We discussed proceeding with below-knee amputation either with myself or Dr. Harden or Dr. Beverley during this admission which he was opposed to.  Plan:   Agree with continuing IV antibiotics. Recommend below-knee amputation if/when  patient is agreeable

## 2024-06-24 NOTE — Progress Notes (Signed)
° °  Brief Progress Note   _____________________________________________________________________________________________________________  Patient Name: Gary Mitchell Patient DOB: [birth year] Date: @TODAY @      Data: Reviewed vital signs, labs, and clinical notes.    Action: No action required at this time.    Response:  ID and Ortho consulted.   _____________________________________________________________________________________________________________  [facility] RN [employer] Allex Madia S Mendi Constable Please contact us  directly via secure chat (search for [facility]) or by calling us  at [phone] [facility]).

## 2024-06-24 NOTE — ED Provider Notes (Addendum)
 [place] EMERGENCY DEPARTMENT AT [facility] Provider Note   CSN: [id number] Arrival date & time: 06/23/24  1956     Patient presents with: Ankle Pain   Gary Mitchell is a 53 y.o. male. Patient with past medical history significant for alcoholic hepatitis, high bilirubin, chronic osteomyelitis of the right ankle, seizures, T2DM presents to the emergency department via EMS due to worsening right ankle pain. He reports running out of his antibiotics one week ago. For the past 4 days he reports lying in his floor, stating he felt too weak to get up to eat or drink. He denies any falls or injuries.      Ankle Pain      Prior to Admission medications  Medication Sig Start Date End Date Taking? Authorizing Provider  acetaminophen  (TYLENOL ) 500 MG tablet Take 2,000 mg by mouth 2 (two) times daily as needed for moderate pain (pain score 4-6), fever or headache.   Yes [provider]  empagliflozin  (JARDIANCE ) 10 MG TABS tablet TAKE 1 TABLET BY MOUTH ONCE DAILY BEFORE BREAKFAST *NEW PRESCRIPTION REQUEST* 05/18/24  Yes Newlin, Corrina, MD  insulin  glargine (LANTUS  SOLOSTAR) 100 UNIT/ML Solostar Pen INJECT 32 UNITS SUBCUTANEOUSLY ONCE DAILY *NEW PRESCRIPTION REQUEST* 05/18/24  Yes Newlin, Enobong, MD  insulin  lispro (HUMALOG  KWIKPEN) 100 UNIT/ML KwikPen Inject 10 Units into the skin 3 (three) times daily. 05/18/24  Yes Newlin, Enobong, MD  levETIRAcetam  (KEPPRA ) 1000 MG tablet TAKE 1 TABLET(S) BY MOUTH 2 TIMES PER DAY *NEW PRESCRIPTION REQUEST* 05/18/24  Yes Newlin, Enobong, MD  metFORMIN  (GLUCOPHAGE ) 500 MG tablet TAKE 2 TABLET(S) BY MOUTH 2 TIMES PER DAY *NEW PRESCRIPTION REQUEST* 05/18/24  Yes Newlin, Enobong, MD  metoprolol  succinate (TOPROL -XL) 25 MG 24 hr tablet TAKE 1/2 (ONE-HALF) TABLET BY MOUTH ONCE DAILY *NEW PRESCRIPTION REQUEST* 05/18/24  Yes Newlin, Enobong, MD  pantoprazole  (PROTONIX ) 40 MG tablet TAKE 1 TABLET(S) BY MOUTH 2 TIMES PER DAY *NEW PRESCRIPTION  REQUEST* 05/18/24  Yes Delbert Corrina, MD  Alcohol  Swabs  (ALCOHOL  PADS) 70 % PADS Clean skin before every injection 05/27/24   Newlin, Enobong, MD  apixaban  (ELIQUIS ) 2.5 MG TABS tablet Take 1 tablet (2.5 mg total) by mouth 2 (two) times daily for 28 days. Patient not taking: Reported on 06/24/2024 01/08/24 07/07/24  Drusilla Sabas RAMAN, MD  atorvastatin  (LIPITOR) 20 MG tablet Take 1 tablet (20 mg total) by mouth daily. Patient not taking: Reported on 06/24/2024 04/02/23   Laurence Locus, DO  Continuous Glucose Sensor (FREESTYLE LIBRE 3 PLUS SENSOR) MISC Change sensor every 15 days. Use to check blood sugar continuously. 05/24/24   Newlin, Enobong, MD  folic acid  (FOLVITE ) 1 MG tablet TAKE 1 TABLET(S) BY MOUTH 1 TIMES PER DAY *NEW PRESCRIPTION REQUEST* Patient not taking: Reported on 06/24/2024 05/18/24   Newlin, Enobong, MD  Insulin  Pen Needle (PEN NEEDLES) 32G X 4 MM MISC Use to inject insulin  once daily. 05/27/24   Newlin, Enobong, MD  Multiple Vitamin (MULTIVITAMIN) TABS TAKE 1 TABLET(S) BY MOUTH 1 TIMES PER DAY *NEW PRESCRIPTION REQUEST* Patient not taking: Reported on 06/24/2024 05/18/24   Newlin, Enobong, MD  thiamine  (VITAMIN B1) 100 MG tablet Take 1 tablet (100 mg total) by mouth daily. 05/24/24   Newlin, Enobong, MD    Allergies: Iohexol  and Nsaids    Review of Systems  Updated Vital Signs BP 126/76   Pulse (!) 102   Temp 98.2 F (36.8 C) (Oral)   Resp 20   SpO2 99%   Physical Exam Constitutional:  General: He is not in acute distress. HENT:     Head: Normocephalic and atraumatic.  Eyes:     Conjunctiva/sclera: Conjunctivae normal.     Pupils: Pupils are equal, round, and reactive to light.  Cardiovascular:     Rate and Rhythm: Regular rhythm. Tachycardia present.  Pulmonary:     Effort: Pulmonary effort is normal. No respiratory distress.  Abdominal:     General: There is no distension.     Tenderness: There is no abdominal tenderness.  Musculoskeletal:     Comments:  Swollen right ankle, no active drainage or crepitus  Skin:    General: Skin is warm and dry.  Neurological:     General: No focal deficit present.     Mental Status: He is alert. Mental status is at baseline.  Psychiatric:        Mood and Affect: Mood normal.        Behavior: Behavior normal.     (all labs ordered are listed, but only abnormal results are displayed) Labs Reviewed  COMPREHENSIVE METABOLIC PANEL WITH GFR - Abnormal; Notable for the following components:      Result Value   Sodium 133 (*)    Chloride 93 (*)    CO2 15 (*)    Glucose, Bld 162 (*)    Calcium  8.6 (*)    Total Protein 8.9 (*)    AST 154 (*)    Alkaline Phosphatase 411 (*)    Total Bilirubin 3.1 (*)    Anion gap 25 (*)    All other components within normal limits  CBC WITH DIFFERENTIAL/PLATELET - Abnormal; Notable for the following components:   RBC 3.98 (*)    Hemoglobin 9.6 (*)    HCT 32.0 (*)    MCH 24.1 (*)    RDW 26.3 (*)    Platelets 120 (*)    All other components within normal limits  URINALYSIS, W/ REFLEX TO CULTURE (INFECTION SUSPECTED) - Abnormal; Notable for the following components:   Color, Urine AMBER (*)    Ketones, ur 20 (*)    Protein, ur 100 (*)    All other components within normal limits  I-STAT CG4 LACTIC ACID, ED - Abnormal; Notable for the following components:   Lactic Acid, Venous 4.6 (*)    All other components within normal limits  I-STAT CG4 LACTIC ACID, ED - Abnormal; Notable for the following components:   Lactic Acid, Venous 4.7 (*)    All other components within normal limits  I-STAT CG4 LACTIC ACID, ED - Abnormal; Notable for the following components:   Lactic Acid, Venous 3.7 (*)    All other components within normal limits  CULTURE, BLOOD (SINGLE)    EKG: None  Radiology: DG Ankle Complete Right Result Date: 06/23/2024 EXAM: 3 OR MORE VIEW(S) XRAY OF THE ANKLE 06/23/2024 11:43:13 PM CLINICAL HISTORY: Chronic osteo, worsening pain, sepsis COMPARISON:  04/23/2024 FINDINGS: BONES AND JOINTS: Stable ORIF of medial malleolus. Extensive bony destruction of distal tibia and fibula. Fragmentation of medial malleolus similar to previous exam. Increasing periprosthetic lucencies surrounding the lag screws within the medial malleolus. Displacement of the retaining button along the medial cortex of the distal tibia in keeping with disruption of the type of reconstruction. Increasing erosion along the fracture margins, within the talar dome, and within the distal tibia and medial malleolar fracture fragments in keeping with progressive changes of osteomyelitis. Osseous structures are diffusely osteopenic in keeping with disease. Stable lateral dislocation of the talar dome in  relation to the tibial plafond. Stable lateral subluxation at tibiotalar articulation. SOFT TISSUES: Extensive surrounding soft tissue swelling. Soft tissue wound now seen with superficial ulceration lateral tibia distal tibial callus. IMPRESSION: 1. Progressive osteomyelitis involving the distal tibia, fibula, and medial malleolus, with involvement of the talar dome. Progressive periprosthetic erosion. Persistent changes in keeping with disruption of the syndesmotic reconstruction. 2. Stable lateral dislocation of the talar dome relative to the tibial plafond with stable lateral subluxation at the tibiotalar articulation. 3. Extensive surrounding soft tissue swelling with a lateral soft tissue wound and superficial ulceration adjacent to the distal tibial callus. 4. Diffuse osteopenia. Electronically signed by: Dorethia Molt MD 06/23/2024 11:55 PM EST RP Workstation: [id number]   DG Chest Port 1 View Result Date: 06/23/2024 EXAM: 1 VIEW(S) XRAY OF THE CHEST 06/23/2024 11:37:00 PM COMPARISON: 02/28/2024 CLINICAL HISTORY: SOB (shortness of breath) FINDINGS: LUNGS AND PLEURA: No focal pulmonary opacity. No pleural effusion. No pneumothorax. HEART AND MEDIASTINUM: No acute abnormality of the cardiac  and mediastinal silhouettes. BONES AND SOFT TISSUES: No acute osseous abnormality. IMPRESSION: 1. No acute cardiopulmonary abnormality. Electronically signed by: Dorethia Molt MD 06/23/2024 11:48 PM EST RP Workstation: [id number]     .Critical Care  Performed by: Logan Ubaldo NOVAK, PA-C Authorized by: Logan Ubaldo NOVAK, PA-C   Critical care provider statement:    Critical care time (minutes):  30   Critical care was necessary to treat or prevent imminent or life-threatening deterioration of the following conditions:  Sepsis   Critical care was time spent personally by me on the following activities:  Development of treatment plan with patient or surrogate, discussions with consultants, evaluation of patient's response to treatment, examination of patient, ordering and review of laboratory studies, ordering and review of radiographic studies, ordering and performing treatments and interventions, pulse oximetry, re-evaluation of patient's condition and review of old charts    Medications Ordered in the ED  lactated ringers  infusion ( Intravenous New Bag/Given 06/24/24 0108)  lactated ringers  bolus 1,000 mL (0 mLs Intravenous Stopped 06/23/24 2346)    And  lactated ringers  bolus 1,000 mL (0 mLs Intravenous Stopped 06/24/24 0009)    And  lactated ringers  bolus 250 mL (0 mLs Intravenous Stopped 06/24/24 0023)  ceFEPIme  (MAXIPIME ) 2 g in sodium chloride  0.9 % 100 mL IVPB (0 g Intravenous Stopped 06/23/24 2348)  metroNIDAZOLE  (FLAGYL ) IVPB 500 mg (0 mg Intravenous Stopped 06/24/24 0042)  vancomycin  (VANCOREADY) IVPB 1250 mg/250 mL (0 mg Intravenous Stopped 06/24/24 0133)  fentaNYL  (SUBLIMAZE ) injection 50 mcg (50 mcg Intravenous Given 06/24/24 0013)  ondansetron  (ZOFRAN ) injection 4 mg (4 mg Intravenous Given 06/24/24 0014)                                    Medical Decision Making Amount and/or Complexity of Data Reviewed Labs: ordered. Radiology: ordered.  Risk Prescription drug  management. Decision regarding hospitalization.   This patient presents to the ED for concern of ankle pain, this involves an extensive number of treatment options, and is a complaint that carries with it a high risk of complications and morbidity.  The differential diagnosis includes worsening osteomyelitis, fracture, dislocation, soft tissue injury, others   Co morbidities / Chronic conditions that complicate the patient evaluation  As noted in HPI   Additional history obtained:  Additional history obtained from EMR External records from outside source obtained and reviewed including infectious disease notes, orthopedic surgery notes.  Orthopedic  surgery recommended BKA in July but patient had refused at that time.   Lab Tests:  I Ordered, and personally interpreted labs.  The pertinent results include: Lactic acid 4.7.  Hemoglobin 9.6.  Decreased bicarb and anion gap of 25, thought to be due to starvation ketosis as patient has not eaten in days   Imaging Studies ordered:  I ordered imaging studies including plain films of the right ankle and chest I independently visualized and interpreted imaging which showed  1. Progressive osteomyelitis involving the distal tibia, fibula, and medial  malleolus, with involvement of the talar dome. Progressive periprosthetic  erosion. Persistent changes in keeping with disruption of the syndesmotic  reconstruction.  2. Stable lateral dislocation of the talar dome relative to the tibial plafond  with stable lateral subluxation at the tibiotalar articulation.  3. Extensive surrounding soft tissue swelling with a lateral soft tissue wound  and superficial ulceration adjacent to the distal tibial callus.  4. Diffuse osteopenia.  1. No acute cardiopulmonary abnormality.  I agree with the radiologist interpretation   Problem List / ED Course / Critical interventions / Medication management   I ordered medication including vancomycin , flagyl ,  cefepime , fentanyl , zofran , LR boluses   Reevaluation of the patient after these medicines showed that the patient had improvement in pain   Consultations Obtained:  I requested consultation with the hospitalist, Dr.Thomas,  and discussed lab and imaging findings as well as pertinent plan - they recommend: admission   Social Determinants of Health:  Patient has Medicaid for his primary health insurance type, is a former smoker   Test / Admission - Considered:  Patient with worsening osteomyelitis of the right lower extremity. He has significant lactic acidosis. Patient needs continued fluids and antibiotics. It has been recommended that the patient have a BKA but patient has previously refused. It may be worthwhile for the patient to have a repeat discussion with orthopedics for definitive management. Patient will need admission for further management.       Final diagnoses:  Other osteomyelitis of right ankle [facility])    ED Discharge Orders     None          Logan Ubaldo KATHEE DEVONNA 06/24/24 0329    Logan Ubaldo KATHEE, PA-C 06/24/24 0329    Logan Ubaldo KATHEE, PA-C 06/24/24 0330    Palumbo, April, MD 06/24/24 [phone]

## 2024-06-24 NOTE — Progress Notes (Signed)
 PROGRESS NOTE    Gary GIRGENTI  FMW:[id number] DOB: 26-Oct-1970 DOA: 06/23/2024 PCP: Delbert Clam, MD    Chief Complaint  Patient presents with   Ankle Pain    Brief Narrative:  Patient 53 year old gentleman history of chronic osteomyelitis of the right ankle, chronic alcohol  abuse, poorly controlled type 2 diabetes, cirrhosis, A-fib, depression, anxiety, anemia, neuropathy of lower extremities, history of seizure disorder, thrombocytopenia recently hospitalized from 10/17-10/25 with diagnosis of chronic osteomyelitis of the right ankle seen by orthopedics at that time and insisted on not having the amputation at that time discharged on Augmentin  and doxycycline  completed course of antibiotic treatment presenting back with generalized weakness, increased pain and swelling to the right ankle.  Imaging done concerning for progressive osteomyelitis.  Patient placed on empiric IV antibiotics.  Orthopedics and ID consulted   Assessment & Plan:   Principal Problem:   Sepsis (HCC) Active Problems:   GERD (gastroesophageal reflux disease)   Alcohol  abuse   Type 2 diabetes mellitus (HCC)   Alcoholic cirrhosis of liver without ascites (HCC)   Hypomagnesemia   Osteomyelitis of right ankle (HCC)  #1 sepsis likely secondary to progressive chronic osteomyelitis of right lower extremity - Patient presenting with increasing right ankle pain and swelling, generalized weakness, patient noted to have elevated lactic acid level of 4.7, elevated sed rate of 38.  Patient noted on admission to be tachycardic. - Plan films of the right ankle done concerning for progressive osteomyelitis involving distal tibia, fibula, medial malleolus and involvement of the talar dome.  Progressive periprosthetic erosion.  Persistent changes in keeping with disruption of the syndesmotic reconstruction.  Stable lateral dislocation of the talar dome relative to the tibial plafond and with stable lateral subluxation at the  tibiotalar articulation.  Extensive surrounding soft tissue swelling with a lateral soft tissue wound and superficial ulceration adjacent to the distal tibial callus.  Diffuse osteopenia. - Check blood cultures x 2 hours 1 set of blood cultures was obtained on admission. - Patient placed on empiric IV cefepime , IV vancomycin  on admission. - Orthopedics consulted, patient seen by Dr. Germaine, who has assessed patient and recommending BKA and discussed with patient and patient currently opposed to BKA at this time. - Orthopedics recommending continuation of IV antibiotics and recommendation of BKA when patient agreeable - ID consulted and antibiotics have been changed from IV cefepime  and IV vancomycin  to IV daptomycin  and IV Unasyn . - MRI pending. - Appreciate ID and orthopedics input and recommendations.  2.  Metabolic acidosis -Likely secondary to the lactic acidosis secondary to problem #1 - Improving with hydration.  3.  History of alcohol  abuse- -place on the Ativan  CIWA withdrawal protocol, thiamine , folic acid , multivitamin.  4.  Alcoholic liver disease with cirrhosis -Follow LFTs. - Outpatient follow-up.  5.  Diabetes mellitus type 2 Hemoglobin A1c 6.9. -CBG noted at 174 this morning. - Patient noted to be on Jardiance , meal coverage Humalog , Lantus  32 units daily, metformin  1000 mg twice daily. - Discontinue Jardiance . - Place on Lantus  20 units daily. - Continue meal coverage NovoLog  10 units 3 times daily with meals. - SSI.  6.  Hypertension -Continue home regimen Toprol -XL.  7.  Seizure disorder -Continue home regimen Keppra .  8.  GERD -PPI.  9.  Hypomagnesemia  - Magnesium  at 0.9. - Magnesium  sulfate 6 g IV x 1. - Repeat labs in the AM.  10.  Anemia -Patient with no overt bleeding. - Follow H&H.  11.  Thrombocytopenia -Follow.   DVT  prophylaxis: SCDs Code Status: Full Family Communication: Updated patient.  No family at bedside. Disposition:  TBD  Status is: Inpatient Remains inpatient appropriate because: Severity of illness   Consultants:  Orthopedics: Dr. Germaine 06/24/2024 ID: Dr. Dennise 06/24/2024  Procedures:  Plain films of the right ankle 06/23/2024 Chest x-ray 06/23/2024 MRI right ankle 06/24/2024 pending  Antimicrobials:  Anti-infectives (From admission, onward)    Start     Dose/Rate Route Frequency Ordered Stop   06/24/24 1400  piperacillin -tazobactam (ZOSYN ) IVPB 3.375 g  Status:  Discontinued        3.375 g 12.5 mL/hr over 240 Minutes Intravenous Every 8 hours 06/24/24 0631 06/24/24 1144   06/24/24 1400  DAPTOmycin  (CUBICIN ) IVPB 500 mg/51mL premix        8 mg/kg  63.5 kg 100 mL/hr over 30 Minutes Intravenous Daily 06/24/24 1144     06/24/24 1200  vancomycin  (VANCOCIN ) IVPB 1000 mg/200 mL premix  Status:  Discontinued        1,000 mg 200 mL/hr over 60 Minutes Intravenous Every 12 hours 06/24/24 0515 06/24/24 1144   06/24/24 1200  Ampicillin -Sulbactam (UNASYN ) 3 g in sodium chloride  0.9 % 100 mL IVPB        3 g 200 mL/hr over 30 Minutes Intravenous Every 6 hours 06/24/24 1144     06/24/24 1000  metroNIDAZOLE  (FLAGYL ) IVPB 500 mg  Status:  Discontinued        500 mg 100 mL/hr over 60 Minutes Intravenous Every 12 hours 06/24/24 0437 06/24/24 0631   06/24/24 0600  ceFEPIme  (MAXIPIME ) 2 g in sodium chloride  0.9 % 100 mL IVPB  Status:  Discontinued        2 g 200 mL/hr over 30 Minutes Intravenous Every 8 hours 06/24/24 0437 06/24/24 0631   06/23/24 2315  vancomycin  (VANCOREADY) IVPB 1250 mg/250 mL        1,250 mg 166.7 mL/hr over 90 Minutes Intravenous  Once 06/23/24 2300 06/24/24 0133   06/23/24 2300  ceFEPIme  (MAXIPIME ) 2 g in sodium chloride  0.9 % 100 mL IVPB        2 g 200 mL/hr over 30 Minutes Intravenous  Once 06/23/24 2258 06/23/24 2348   06/23/24 2300  metroNIDAZOLE  (FLAGYL ) IVPB 500 mg        500 mg 100 mL/hr over 60 Minutes Intravenous  Once 06/23/24 2258 06/24/24 0042   06/23/24 2300   vancomycin  (VANCOCIN ) IVPB 1000 mg/200 mL premix  Status:  Discontinued        1,000 mg 200 mL/hr over 60 Minutes Intravenous  Once 06/23/24 2258 06/23/24 2300         Subjective: Sitting up in gurney.  Denies any chest pain or shortness of breath.  No abdominal pain.  States some improvement with right ankle pain.  States was significantly weak and as such was on the ground for several days.  Denies any falls.  Feels a little bit better today.  Objective: Vitals:   06/24/24 1800 06/24/24 1808 06/24/24 1830 06/24/24 1900  BP: (!) 143/89  139/83 137/76  Pulse:  92 94 91  Resp:  18 17 16   Temp:    98.6 F (37 C)  TempSrc:    Oral  SpO2:  100% 100% 100%  Weight:      Height:        Intake/Output Summary (Last 24 hours) at 06/24/2024 1922 Last data filed at 06/24/2024 1414 Gross per 24 hour  Intake 929.99 ml  Output 2050 ml  Net -1120.01  ml   Filed Weights   06/24/24 0400  Weight: 63.5 kg    Examination:  General exam: Appears calm and comfortable  Respiratory system: Clear to auscultation. Respiratory effort normal. Cardiovascular system: S1 & S2 heard, RRR. No JVD, murmurs, rubs, gallops or clicks. No pedal edema. Gastrointestinal system: Abdomen is nondistended, soft and nontender. No organomegaly or masses felt. Normal bowel sounds heard. Central nervous system: Alert and oriented. No focal neurological deficits. Extremities: Right ankle swelling.  No significant tenderness to palpation.  Skin: No rashes, lesions or ulcers Psychiatry: Judgement and insight appear normal. Mood & affect appropriate.     Data Reviewed: I have personally reviewed following labs and imaging studies  CBC: Recent Labs  Lab 06/23/24 2107 06/24/24 0547  WBC 8.3 6.7  NEUTROABS 5.5  --   HGB 9.6* 8.0*  HCT 32.0* 25.8*  MCV 80.4 77.5*  PLT 120* 88*    Basic Metabolic Panel: Recent Labs  Lab 06/23/24 2107 06/24/24 0547 06/24/24 0905  NA 133* 133*  --   K 4.1 3.7  --   CL  93* 94*  --   CO2 15* 22  --   GLUCOSE 162* 191*  --   BUN 10 7  --   CREATININE 0.67 0.46*  --   CALCIUM  8.6* 8.0*  --   MG  --   --  0.9*    GFR: Estimated Creatinine Clearance: 91.5 mL/min (A) (by C-G formula based on SCr of 0.46 mg/dL (L)).  Liver Function Tests: Recent Labs  Lab 06/23/24 2107 06/24/24 0547  AST 154* 121*  ALT 31 27  ALKPHOS 411* 343*  BILITOT 3.1* 2.4*  PROT 8.9* 7.5  ALBUMIN 3.5 3.1*    CBG: Recent Labs  Lab 06/24/24 0741 06/24/24 1207 06/24/24 1646  GLUCAP 174* 167* 76     No results found for this or any previous visit (from the past 240 hours).       Radiology Studies: DG Ankle Complete Right Result Date: 06/23/2024 EXAM: 3 OR MORE VIEW(S) XRAY OF THE ANKLE 06/23/2024 11:43:13 PM CLINICAL HISTORY: Chronic osteo, worsening pain, sepsis COMPARISON: 04/23/2024 FINDINGS: BONES AND JOINTS: Stable ORIF of medial malleolus. Extensive bony destruction of distal tibia and fibula. Fragmentation of medial malleolus similar to previous exam. Increasing periprosthetic lucencies surrounding the lag screws within the medial malleolus. Displacement of the retaining button along the medial cortex of the distal tibia in keeping with disruption of the type of reconstruction. Increasing erosion along the fracture margins, within the talar dome, and within the distal tibia and medial malleolar fracture fragments in keeping with progressive changes of osteomyelitis. Osseous structures are diffusely osteopenic in keeping with disease. Stable lateral dislocation of the talar dome in relation to the tibial plafond. Stable lateral subluxation at tibiotalar articulation. SOFT TISSUES: Extensive surrounding soft tissue swelling. Soft tissue wound now seen with superficial ulceration lateral tibia distal tibial callus. IMPRESSION: 1. Progressive osteomyelitis involving the distal tibia, fibula, and medial malleolus, with involvement of the talar dome. Progressive  periprosthetic erosion. Persistent changes in keeping with disruption of the syndesmotic reconstruction. 2. Stable lateral dislocation of the talar dome relative to the tibial plafond with stable lateral subluxation at the tibiotalar articulation. 3. Extensive surrounding soft tissue swelling with a lateral soft tissue wound and superficial ulceration adjacent to the distal tibial callus. 4. Diffuse osteopenia. Electronically signed by: Dorethia Molt MD 06/23/2024 11:55 PM EST RP Workstation: [id number]   DG Chest Port 1 View Result Date: 06/23/2024 EXAM:  1 VIEW(S) XRAY OF THE CHEST 06/23/2024 11:37:00 PM COMPARISON: 02/28/2024 CLINICAL HISTORY: SOB (shortness of breath) FINDINGS: LUNGS AND PLEURA: No focal pulmonary opacity. No pleural effusion. No pneumothorax. HEART AND MEDIASTINUM: No acute abnormality of the cardiac and mediastinal silhouettes. BONES AND SOFT TISSUES: No acute osseous abnormality. IMPRESSION: 1. No acute cardiopulmonary abnormality. Electronically signed by: Dorethia Molt MD 06/23/2024 11:48 PM EST RP Workstation: [id number]        Scheduled Meds:  atorvastatin   20 mg Oral Daily   folic acid   1 mg Oral Daily   heparin   5,000 Units Subcutaneous Q8H   insulin  aspart  0-6 Units Subcutaneous TID WC   insulin  aspart  10 Units Subcutaneous TID WC   insulin  glargine  20 Units Subcutaneous Daily   levETIRAcetam   1,000 mg Oral BID   LORazepam   0-4 mg Oral Q6H   Followed by   NOREEN ON 06/26/2024] LORazepam   0-4 mg Oral Q12H   metoprolol  succinate  12.5 mg Oral Daily   multivitamin with minerals  1 tablet Oral Daily   multivitamin with minerals  1 tablet Oral Daily   pantoprazole   40 mg Oral BID   thiamine   100 mg Oral Daily   Or   thiamine   100 mg Intravenous Daily   thiamine   100 mg Oral Daily   Continuous Infusions:  ampicillin -sulbactam (UNASYN ) IV Stopped (06/24/24 1805)   DAPTOmycin  Stopped (06/24/24 1413)   lactated ringers  150 mL/hr at 06/24/24 1230     LOS: 0  days    Time spent: 35 minutes    Toribio Hummer, MD [facility]   To contact the attending provider between 7A-7P or the covering provider during after hours 7P-7A, please log into the web site [email or web address] and access using universal [place] password for that web site. If you do not have the password, please call the hospital operator.  06/24/2024, 7:22 PM

## 2024-06-24 NOTE — ED Provider Notes (Incomplete)
  EMERGENCY DEPARTMENT AT [facility] Provider Note   CSN: [id number] Arrival date & time: 06/23/24  1956     Patient presents with: Ankle Pain   Gary Mitchell is a 53 y.o. male. Patient with past medical history significant for alcoholic hepatitis, high bilirubin, chronic osteomyelitis of the right ankle, seizures, T2DM presents to the emergency department via EMS due to worsening right ankle pain. He reports running out of his antibiotics one week ago. For the past 4 days he reports lying in his floor, stating he felt too weak to get up to eat or drink. He denies any falls or injuries.    {Add pertinent medical, surgical, social history, OB history to HPI:32947}  Ankle Pain      Prior to Admission medications  Medication Sig Start Date End Date Taking? Authorizing Provider  acetaminophen  (TYLENOL ) 500 MG tablet Take 1,500 mg by mouth daily as needed for moderate pain (pain score 4-6), fever or headache.    [provider]  Alcohol  Swabs  (ALCOHOL  PADS) 70 % PADS Clean skin before every injection 05/27/24   Newlin, Enobong, MD  apixaban  (ELIQUIS ) 2.5 MG TABS tablet Take 1 tablet (2.5 mg total) by mouth 2 (two) times daily for 28 days. 01/08/24 07/07/24  Drusilla Sabas RAMAN, MD  atorvastatin  (LIPITOR) 20 MG tablet Take 1 tablet (20 mg total) by mouth daily. 04/02/23   Laurence Locus, DO  Continuous Glucose Sensor (FREESTYLE LIBRE 3 PLUS SENSOR) MISC Change sensor every 15 days. Use to check blood sugar continuously. 05/24/24   Newlin, Enobong, MD  empagliflozin  (JARDIANCE ) 10 MG TABS tablet TAKE 1 TABLET BY MOUTH ONCE DAILY BEFORE BREAKFAST *NEW PRESCRIPTION REQUEST* 05/18/24   Newlin, Enobong, MD  folic acid  (FOLVITE ) 1 MG tablet TAKE 1 TABLET(S) BY MOUTH 1 TIMES PER DAY *NEW PRESCRIPTION REQUEST* 05/18/24   Newlin, Enobong, MD  ibuprofen  (ADVIL ) 200 MG tablet Take 400 mg by mouth every 6 (six) hours as needed for mild pain (pain score 1-3) or moderate pain (pain score  4-6).    [provider]  insulin  glargine (LANTUS  SOLOSTAR) 100 UNIT/ML Solostar Pen INJECT 32 UNITS SUBCUTANEOUSLY ONCE DAILY *NEW PRESCRIPTION REQUEST* 05/18/24   Newlin, Enobong, MD  insulin  lispro (HUMALOG  KWIKPEN) 100 UNIT/ML KwikPen Inject 10 Units into the skin 3 (three) times daily. 05/18/24   Newlin, Enobong, MD  Insulin  Pen Needle (PEN NEEDLES) 32G X 4 MM MISC Use to inject insulin  once daily. 05/27/24   Newlin, Enobong, MD  lactulose  (CHRONULAC ) 10 GM/15ML solution Take 45 mLs (30 g total) by mouth as needed for mild constipation. 12/08/23   Beather Delon Gibson, PA  levETIRAcetam  (KEPPRA ) 1000 MG tablet TAKE 1 TABLET(S) BY MOUTH 2 TIMES PER DAY *NEW PRESCRIPTION REQUEST* 05/18/24   Newlin, Enobong, MD  Menthol-Methyl Salicylate (MUSCLE RUB) 10-15 % CREA Apply 1 Application topically as needed for muscle pain (arm pain).    [provider]  metFORMIN  (GLUCOPHAGE ) 500 MG tablet TAKE 2 TABLET(S) BY MOUTH 2 TIMES PER DAY *NEW PRESCRIPTION REQUEST* 05/18/24   Newlin, Enobong, MD  metoprolol  succinate (TOPROL -XL) 25 MG 24 hr tablet TAKE 1/2 (ONE-HALF) TABLET BY MOUTH ONCE DAILY *NEW PRESCRIPTION REQUEST* 05/18/24   Newlin, Enobong, MD  Multiple Vitamin (MULTIVITAMIN) TABS TAKE 1 TABLET(S) BY MOUTH 1 TIMES PER DAY *NEW PRESCRIPTION REQUEST* 05/18/24   Newlin, Enobong, MD  pantoprazole  (PROTONIX ) 40 MG tablet TAKE 1 TABLET(S) BY MOUTH 2 TIMES PER DAY *NEW PRESCRIPTION REQUEST* 05/18/24   Delbert Clam, MD  thiamine  (VITAMIN B1) 100 MG tablet Take 1 tablet (100 mg total) by mouth daily. 05/24/24   Delbert Clam, MD    Allergies: Iohexol  and Nsaids    Review of Systems  Updated Vital Signs BP (!) 145/86   Pulse (!) 115   Temp 99 F (37.2 C) (Temporal)   Resp 18   SpO2 100%   Physical Exam  (all labs ordered are listed, but only abnormal results are displayed) Labs Reviewed  COMPREHENSIVE METABOLIC PANEL WITH GFR - Abnormal; Notable for the following components:       Result Value   Sodium 133 (*)    Chloride 93 (*)    CO2 15 (*)    Glucose, Bld 162 (*)    Calcium  8.6 (*)    Total Protein 8.9 (*)    AST 154 (*)    Alkaline Phosphatase 411 (*)    Total Bilirubin 3.1 (*)    Anion gap 25 (*)    All other components within normal limits  CBC WITH DIFFERENTIAL/PLATELET - Abnormal; Notable for the following components:   RBC 3.98 (*)    Hemoglobin 9.6 (*)    HCT 32.0 (*)    MCH 24.1 (*)    RDW 26.3 (*)    Platelets 120 (*)    All other components within normal limits  I-STAT CG4 LACTIC ACID, ED - Abnormal; Notable for the following components:   Lactic Acid, Venous 4.6 (*)    All other components within normal limits  CULTURE, BLOOD (SINGLE)  URINALYSIS, W/ REFLEX TO CULTURE (INFECTION SUSPECTED)  I-STAT CG4 LACTIC ACID, ED    EKG: None  Radiology: DG Ankle Complete Right Result Date: 06/23/2024 EXAM: 3 OR MORE VIEW(S) XRAY OF THE ANKLE 06/23/2024 11:43:13 PM CLINICAL HISTORY: Chronic osteo, worsening pain, sepsis COMPARISON: 04/23/2024 FINDINGS: BONES AND JOINTS: Stable ORIF of medial malleolus. Extensive bony destruction of distal tibia and fibula. Fragmentation of medial malleolus similar to previous exam. Increasing periprosthetic lucencies surrounding the lag screws within the medial malleolus. Displacement of the retaining button along the medial cortex of the distal tibia in keeping with disruption of the type of reconstruction. Increasing erosion along the fracture margins, within the talar dome, and within the distal tibia and medial malleolar fracture fragments in keeping with progressive changes of osteomyelitis. Osseous structures are diffusely osteopenic in keeping with disease. Stable lateral dislocation of the talar dome in relation to the tibial plafond. Stable lateral subluxation at tibiotalar articulation. SOFT TISSUES: Extensive surrounding soft tissue swelling. Soft tissue wound now seen with superficial ulceration lateral  tibia distal tibial callus. IMPRESSION: 1. Progressive osteomyelitis involving the distal tibia, fibula, and medial malleolus, with involvement of the talar dome. Progressive periprosthetic erosion. Persistent changes in keeping with disruption of the syndesmotic reconstruction. 2. Stable lateral dislocation of the talar dome relative to the tibial plafond with stable lateral subluxation at the tibiotalar articulation. 3. Extensive surrounding soft tissue swelling with a lateral soft tissue wound and superficial ulceration adjacent to the distal tibial callus. 4. Diffuse osteopenia. Electronically signed by: Dorethia Molt MD 06/23/2024 11:55 PM EST RP Workstation: [id number]   DG Chest Port 1 View Result Date: 06/23/2024 EXAM: 1 VIEW(S) XRAY OF THE CHEST 06/23/2024 11:37:00 PM COMPARISON: 02/28/2024 CLINICAL HISTORY: SOB (shortness of breath) FINDINGS: LUNGS AND PLEURA: No focal pulmonary opacity. No pleural effusion. No pneumothorax. HEART AND MEDIASTINUM: No acute abnormality of the cardiac and mediastinal silhouettes. BONES AND SOFT TISSUES: No acute osseous abnormality. IMPRESSION: 1. No acute cardiopulmonary  abnormality. Electronically signed by: Dorethia Molt MD 06/23/2024 11:48 PM EST RP Workstation: [id number]    {Document cardiac monitor, telemetry assessment procedure when appropriate:32947} Procedures   Medications Ordered in the ED  lactated ringers  infusion (has no administration in time range)  lactated ringers  bolus 1,000 mL (0 mLs Intravenous Stopped 06/23/24 2346)    And  lactated ringers  bolus 1,000 mL (1,000 mLs Intravenous New Bag/Given 06/23/24 2339)    And  lactated ringers  bolus 250 mL (250 mLs Intravenous New Bag/Given 06/24/24 0016)  metroNIDAZOLE  (FLAGYL ) IVPB 500 mg (500 mg Intravenous New Bag/Given 06/23/24 2342)  vancomycin  (VANCOREADY) IVPB 1250 mg/250 mL (1,250 mg Intravenous New Bag/Given 06/24/24 0003)  ceFEPIme  (MAXIPIME ) 2 g in sodium chloride  0.9 % 100 mL IVPB  (0 g Intravenous Stopped 06/23/24 2348)  fentaNYL  (SUBLIMAZE ) injection 50 mcg (50 mcg Intravenous Given 06/24/24 0013)  ondansetron  (ZOFRAN ) injection 4 mg (4 mg Intravenous Given 06/24/24 0014)      {Click here for ABCD2, HEART and other calculators REFRESH Note before signing:1}                              Medical Decision Making Amount and/or Complexity of Data Reviewed Labs: ordered. Radiology: ordered.  Risk Prescription drug management.   ***  {Document critical care time when appropriate  Document review of labs and clinical decision tools ie CHADS2VASC2, etc  Document your independent review of radiology images and any outside records  Document your discussion with family members, caretakers and with consultants  Document social determinants of health affecting pt's care  Document your decision making why or why not admission, treatments were needed:32947:::1}   Final diagnoses:  None    ED Discharge Orders     None

## 2024-06-24 NOTE — ED Notes (Signed)
 Patient transported to MRI

## 2024-06-24 NOTE — Consult Note (Signed)
 [facility]    Date of Admission:  06/23/2024     Total days of antibiotics 2               Reason for Consult: Osteomyelitis    Referring Provider: Dr. 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  Blood cultures are pending.  Discontinue cefepime  and start ampicillin -sulbactam and change vancomycin  to Daptomycin .  Initiate therapeutic drug monitoring of renal function and CK per protocol.  Await MRI results although suspect he will need orthopedic consult and likely amputation given progressive osteomyelitis findings as antibiotics alone are unlikely to solve this infection.  Contact precautions for vancomycin -resistant Enterococcus and MRSA.  Monitor blood cultures for bacteremia.  Remaining medical and supportive care per internal medicine.  PLAN:  Continue current dose of daptomcycin and change cefepime  to ampicillin -sulbactam. Therapeutic drug monitoring of vancomycin  levels and renal function to ensure continued safety with antibiotic administration of vancomycin . Monitor blood cultures for bacteremia  Await MRI results. Contact precautions for vancomycin  resistant Enterococcus and MRSA. Manage medical and supportive care per internal medicine.   Principal Problem:   Sepsis (HCC)    atorvastatin   20 mg Oral Daily   empagliflozin   10 mg Oral Daily   heparin   5,000 Units Subcutaneous Q8H   insulin  aspart  0-6 Units Subcutaneous TID WC   insulin  aspart  10 Units Subcutaneous TID WC   insulin  glargine  20 Units  Subcutaneous Daily   levETIRAcetam   1,000 mg Oral BID   metoprolol  succinate  12.5 mg Oral Daily   multivitamin with minerals  1 tablet Oral Daily   pantoprazole   40 mg Oral BID   thiamine   100 mg Oral Daily     HPI: Gary Mitchell is a 53 y.o. male with previous medical history of alcoholic cirrhosis, depression, type 2 diabetes, neuropathy, and seizures presenting to the ED for ankle swelling and pain.  Gary Mitchell is known to the ID team having last been seen by Dr. 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  Previously treated with doxycycline  and amoxicillin /clavulanate.  Orthopedics recommended amputation and was requested to follow-up with orthopedics following that appointment with no follow-ups noted.  Gary Mitchell is now presenting to the ED with 3-day history of worsening swelling and pain in his right ankle.  Initially afebrile with white blood cell count of 6700.  Started on broad-spectrum coverage with vancomycin , cefepime , metronidazole .  Chest x-ray with no cardiopulmonary abnormalities.  Right ankle x-ray with progressive osteomyelitis involving the distal fibula, tibia, and medial malleolus and involvement of the talar dome.  MRI is pending.  Most recent lab work with hemoglobin A1c of 6.9%.  ID has been asked to make antibiotic recommendations.   Review of Systems: Review of Systems  Constitutional:  Negative for chills, fever and weight loss.  Respiratory:  Negative for cough, shortness of breath and wheezing.   Cardiovascular:  Negative for chest pain and leg swelling.  Gastrointestinal:  Negative for abdominal pain, constipation, diarrhea, nausea and vomiting.  Musculoskeletal:  Right ankle pain and swelling  Skin:  Negative for rash.     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   Seizures (HCC)    Seizures (HCC)    Thrombocytopenia     Social History[1]  Family History  Problem Relation Age of Onset   Diabetes Mellitus II Mother    Colon cancer Neg Hx    Esophageal cancer Neg Hx    Inflammatory bowel disease Neg Hx    Liver disease Neg Hx    Pancreatic cancer Neg Hx    Rectal cancer Neg Hx    Stomach cancer Neg Hx     Allergies[2]  OBJECTIVE: Blood pressure 136/75, pulse 96, temperature 98.5 F (36.9 C), temperature source Oral, resp. rate 15, height 5' 4 (1.626 m), weight 63.5 kg, SpO2 100%.  Physical Exam Constitutional:      General: He is not in acute distress.    Appearance: He is well-developed.  Cardiovascular:     Rate and Rhythm: Normal rate and regular rhythm.     Heart sounds: Normal heart sounds.  Pulmonary:     Effort: Pulmonary effort is normal.     Breath sounds: Normal breath sounds.  Musculoskeletal:     Comments: Right ankle edematous and tender.   Skin:    General: Skin is warm and dry.  Neurological:     Mental Status: He is alert and oriented to person, place, and time.     Lab Results Lab Results  Component Value Date   WBC 6.7 06/24/2024   HGB 8.0 (L) 06/24/2024   HCT 25.8 (L) 06/24/2024   MCV 77.5 (L) 06/24/2024   PLT 88 (L) 06/24/2024    Lab Results  Component Value Date   CREATININE 0.46 (L) 06/24/2024   BUN 7 06/24/2024   NA 133 (L) 06/24/2024   K 3.7 06/24/2024   CL 94 (L)  06/24/2024   CO2 22 06/24/2024    Lab Results  Component Value Date   ALT 27 06/24/2024   AST 121 (H) 06/24/2024   ALKPHOS 343 (H) 06/24/2024   BILITOT 2.4 (H) 06/24/2024     Microbiology: No results found for this or any previous visit (from the past 240 hours).   Cathlyn July, NP [facility]  06/24/2024  9:08 AM     [1]  Social History Tobacco Use   Smoking status: Former    Current packs/day: 0.00    Average packs/day: 0.1 packs/day for 25.0 years (2.5 ttl pk-yrs)    Types: Cigarettes    Start date: 39    Quit date: 2019    Years since quitting: 6.9    Passive exposure: Past   Smokeless tobacco: Never   Tobacco comments:    Former smoker 08/08/22  Vaping Use   Vaping status: Never Used  Substance Use Topics   Alcohol  use: Not Currently   Drug use: Not Currently  [2]  Allergies Allergen Reactions   Iohexol  Other (See Comments)    Unknown reaction at 52 days old Mom at bedside reported patient was given injections of iohexol  in foot and resulted in hole in foot or foot got infected   Nsaids Other (See Comments)    Patient said he was told he could take Tylenol  ONLY.

## 2024-06-24 NOTE — Progress Notes (Signed)
 Pharmacy Antibiotic Note  Gary Mitchell is a 53 y.o. male admitted on 06/23/2024 with worsening R. ankle pain.  Pharmacy has been consulted for vancomycin  dosing. PMH includes chronic osteo of R. Ankle for which underwent lateral screw removal in 02/04/2024 w/ X-ray showing osteo and septic arthritis of r. Ankle and cultures growing MRSA/ enterococcus gallinarum (R-vancomycin )and tx'd with unasyn /vanc >> doxy/augmentin    -WBC WNL, afebrile, sCr ~bl -Single blood culture collected -X-ray: progressive osteo in R. ankle  Plan: -Zosyn  3.375gm IV every 8 hours -Vancomycin  1000mg  IV every 12 hours (AUC 540, Vd 0.72, IBW, sCr 0.8) -Monitor renal function -Follow up signs of clinical improvement, LOT, de-escalation of antibiotics  -F/u ortho consult   Height: 5' 4 (162.6 cm) Weight: 63.5 kg (140 lb) IBW/kg (Calculated) : 59.2  Temp (24hrs), Avg:98.4 F (36.9 C), Min:98.1 F (36.7 C), Max:99 F (37.2 C)  Recent Labs  Lab 06/23/24 2107 06/23/24 2113 06/24/24 0115 06/24/24 0326  WBC 8.3  --   --   --   CREATININE 0.67  --   --   --   LATICACIDVEN  --  4.6* 4.7* 3.7*    Estimated Creatinine Clearance: 91.5 mL/min (by C-G formula based on SCr of 0.67 mg/dL).    Allergies[1]  Antimicrobials this admission: Cefepime /flagyl  x1 Zosyn  12/18 >>  Vancomycin  12/17 >>   Microbiology results: 12/17 BCx:   Thank you for allowing pharmacy to be a part of this patients care.  Lynwood Poplar, PharmD, BCPS Clinical Pharmacist 06/24/2024 5:08 AM       [1]  Allergies Allergen Reactions   Iohexol  Other (See Comments)    Unknown reaction at 51 days old Mom at bedside reported patient was given injections of iohexol  in foot and resulted in hole in foot or foot got infected   Nsaids Other (See Comments)    Patient said he was told he could take Tylenol  ONLY.

## 2024-06-25 DIAGNOSIS — D649 Anemia, unspecified: Secondary | ICD-10-CM

## 2024-06-25 DIAGNOSIS — D696 Thrombocytopenia, unspecified: Secondary | ICD-10-CM

## 2024-06-25 DIAGNOSIS — G40909 Epilepsy, unspecified, not intractable, without status epilepticus: Secondary | ICD-10-CM

## 2024-06-25 DIAGNOSIS — E876 Hypokalemia: Secondary | ICD-10-CM

## 2024-06-25 LAB — CBC WITH DIFFERENTIAL/PLATELET
Abs Immature Granulocytes: 0.02 K/uL (ref 0.00–0.07)
Basophils Absolute: 0 K/uL (ref 0.0–0.1)
Basophils Relative: 1 %
Eosinophils Absolute: 0.1 K/uL (ref 0.0–0.5)
Eosinophils Relative: 1 %
HCT: 26.7 % — ABNORMAL LOW (ref 39.0–52.0)
Hemoglobin: 8.6 g/dL — ABNORMAL LOW (ref 13.0–17.0)
Immature Granulocytes: 0 %
Lymphocytes Relative: 23 %
Lymphs Abs: 1.5 K/uL (ref 0.7–4.0)
MCH: 24.3 pg — ABNORMAL LOW (ref 26.0–34.0)
MCHC: 32.2 g/dL (ref 30.0–36.0)
MCV: 75.4 fL — ABNORMAL LOW (ref 80.0–100.0)
Monocytes Absolute: 0.8 K/uL (ref 0.1–1.0)
Monocytes Relative: 13 %
Neutro Abs: 3.9 K/uL (ref 1.7–7.7)
Neutrophils Relative %: 62 %
Platelets: 71 K/uL — ABNORMAL LOW (ref 150–400)
RBC: 3.54 MIL/uL — ABNORMAL LOW (ref 4.22–5.81)
RDW: 25.4 % — ABNORMAL HIGH (ref 11.5–15.5)
Smear Review: NORMAL
WBC: 6.3 K/uL (ref 4.0–10.5)
nRBC: 0 % (ref 0.0–0.2)

## 2024-06-25 LAB — GLUCOSE, CAPILLARY
Glucose-Capillary: 103 mg/dL — ABNORMAL HIGH (ref 70–99)
Glucose-Capillary: 163 mg/dL — ABNORMAL HIGH (ref 70–99)
Glucose-Capillary: 125 mg/dL — ABNORMAL HIGH (ref 70–99)
Glucose-Capillary: 156 mg/dL — ABNORMAL HIGH (ref 70–99)
Glucose-Capillary: 174 mg/dL — ABNORMAL HIGH (ref 70–99)

## 2024-06-25 LAB — RENAL FUNCTION PANEL
Albumin: 3.2 g/dL — ABNORMAL LOW (ref 3.5–5.0)
Anion gap: 13 (ref 5–15)
BUN: <5 mg/dL — ABNORMAL LOW (ref 6–20)
CO2: 26 mmol/L (ref 22–32)
Calcium: 8.2 mg/dL — ABNORMAL LOW (ref 8.9–10.3)
Chloride: 91 mmol/L — ABNORMAL LOW (ref 98–111)
Creatinine, Ser: 0.54 mg/dL — ABNORMAL LOW (ref 0.61–1.24)
GFR, Estimated: >60 mL/min
Glucose, Bld: 127 mg/dL — ABNORMAL HIGH (ref 70–99)
Phosphorus: 1.5 mg/dL — ABNORMAL LOW (ref 2.5–4.6)
Potassium: 3.2 mmol/L — ABNORMAL LOW (ref 3.5–5.1)
Sodium: 130 mmol/L — ABNORMAL LOW (ref 135–145)

## 2024-06-25 LAB — CK: Total CK: 336 U/L (ref 49–397)

## 2024-06-25 LAB — MAGNESIUM: Magnesium: 1.3 mg/dL — ABNORMAL LOW (ref 1.7–2.4)

## 2024-06-25 LAB — MRSA NEXT GEN BY PCR, NASAL: MRSA by PCR Next Gen: NOT DETECTED

## 2024-06-25 LAB — VITAMIN B12: Vitamin B-12: 632 pg/mL (ref 180–914)

## 2024-06-25 LAB — LACTIC ACID, PLASMA: Lactic Acid, Venous: 1.5 mmol/L (ref 0.5–1.9)

## 2024-06-25 LAB — FOLATE: Folate: 11 ng/mL

## 2024-06-25 MED ORDER — MAGNESIUM SULFATE 50 % IJ SOLN
6.0000 g | Freq: Once | INTRAVENOUS | Status: AC
  Administered 2024-06-25: 6 g via INTRAVENOUS
  Filled 2024-06-25: qty 2

## 2024-06-25 MED ORDER — ORAL CARE MOUTH RINSE: 15.0000 mL | OROMUCOSAL | Status: DC | PRN

## 2024-06-25 MED ORDER — POTASSIUM PHOSPHATES 15 MMOLE/5ML IV SOLN
45.0000 mmol | Freq: Once | INTRAVENOUS | Status: AC
  Administered 2024-06-25: 45 mmol via INTRAVENOUS
  Filled 2024-06-25: qty 15

## 2024-06-25 MED ORDER — POTASSIUM CHLORIDE CRYS ER 20 MEQ PO TBCR
40.0000 meq | EXTENDED_RELEASE_TABLET | ORAL | Status: AC
  Administered 2024-06-25 (×2): 40 meq via ORAL
  Filled 2024-06-25 (×2): qty 2

## 2024-06-25 NOTE — Progress Notes (Incomplete)
" ° ° °  Against Medical Advice  I was notified at 9:51 PM that patient, Gary Mitchell , expresses desire to leave the Hospital immediately.  I was able to further discuss this decision with the patient via telephone.   Patient is A/O x4 and expresses irritability over needing to remain hospitalized.  he states the reason for leaving hospital care against advice is due to ***  I have discussed with the patient his care and reason for hospitalization, including ongoing treatment for chronic osteomyelitis of right lower extremity. I have made it clear many times that current recommended treatment is to continue with IV antibiotics.    Although the patient confirms he understands why I am recommending he remain admitted, the patient is adamant about leaving medical care now.    I have warned the patient that leaving AMA can result in medical complications like worsening infection, irreversible limb and organ damage, disability and/ or death.       The patient understands and accepts the risks involved and assumes full responsibilty of this decision.   Patient is agreeable to signing AMA form.  This patient has also been advised that if they feel the need for further medical assistance to return to any available ER or dial  [id number]. Informed by Nursing staff that this patient has left care and has signed the AMA form on 06/25/2024 at ***.    Joene Gelder, DNP, ACNPC- AG [facility] [place]    "

## 2024-06-25 NOTE — Plan of Care (Signed)
  Problem: Coping: Goal: Ability to adjust to condition or change in health will improve Outcome: Progressing   Problem: Health Behavior/Discharge Planning: Goal: Ability to identify and utilize available resources and services will improve Outcome: Progressing   

## 2024-06-25 NOTE — Progress Notes (Signed)
 " PROGRESS NOTE    Gary Mitchell  FMW:[id number] DOB: 1971-02-26 DOA: 06/23/2024 PCP: Delbert Clam, MD    Chief Complaint  Patient presents with   Ankle Pain    Brief Narrative:  Patient 53 year old gentleman history of chronic osteomyelitis of the right ankle, chronic alcohol  abuse, poorly controlled type 2 diabetes, cirrhosis, A-fib, depression, anxiety, anemia, neuropathy of lower extremities, history of seizure disorder, thrombocytopenia recently hospitalized from 10/17-10/25 with diagnosis of chronic osteomyelitis of the right ankle seen by orthopedics at that time and insisted on not having the amputation at that time discharged on Augmentin  and doxycycline  completed course of antibiotic treatment presenting back with generalized weakness, increased pain and swelling to the right ankle.  Imaging done concerning for progressive osteomyelitis.  Patient placed on empiric IV antibiotics.  Orthopedics and ID consulted   Assessment & Plan:   Principal Problem:   Sepsis (HCC) Active Problems:   GERD (gastroesophageal reflux disease)   Alcohol  abuse   Type 2 diabetes mellitus (HCC)   Alcoholic cirrhosis of liver without ascites (HCC)   Hypomagnesemia   Hypokalemia   Osteomyelitis of right ankle (HCC)   Hypophosphatemia  #1 sepsis likely secondary to progressive chronic osteomyelitis of right lower extremity - Patient presented with increasing right ankle pain and swelling, generalized weakness, patient noted to have elevated lactic acid level of 4.7, elevated sed rate of 38.  Patient noted on admission to be tachycardic. - Plan films of the right ankle done concerning for progressive osteomyelitis involving distal tibia, fibula, medial malleolus and involvement of the talar dome.  Progressive periprosthetic erosion.  Persistent changes in keeping with disruption of the syndesmotic reconstruction.  Stable lateral dislocation of the talar dome relative to the tibial plafond and with  stable lateral subluxation at the tibiotalar articulation.  Extensive surrounding soft tissue swelling with a lateral soft tissue wound and superficial ulceration adjacent to the distal tibial callus.  Diffuse osteopenia. - Blood cultures ordered and pending.. - Patient placed on empiric IV cefepime , IV vancomycin  on admission. - Orthopedics consulted, patient seen by Dr. Germaine, who has assessed patient and recommending BKA and discussed with patient and patient currently opposed to BKA at this time. - Orthopedics recommending continuation of IV antibiotics and recommendation of BKA when patient agreeable - ID consulted and antibiotics have been changed from IV cefepime  and IV vancomycin  to IV daptomycin  and IV Unasyn . - MRI pending. - Appreciate ID and orthopedics input and recommendations.  2.  Metabolic acidosis/lactic acid -Likely secondary to the lactic acidosis secondary to problem #1 - Improved with hydration.  3.  History of alcohol  abuse- -patient states does not drink as heavily as he used 2 before in the past.   - No significant withdrawal symptoms noted on exam.   - Continue Ativan  CIWA withdrawal protocol, thiamine , folic acid , multivitamin.  4.  Alcoholic liver disease with cirrhosis -Follow LFTs. - Outpatient follow-up.  5.  Diabetes mellitus type 2 Hemoglobin A1c 6.9. -CBG noted at 163 this morning. - Patient noted to be on Jardiance , meal coverage Humalog , Lantus  32 units daily, metformin  1000 mg twice daily. - Discontinued Jardiance . - Continue Lantus  20 units daily, no coverage NovoLog  10 units 3 times daily with meals, SSI.   6.  Hypertension - Continue Toprol -XL.   7.  Seizure disorder - Continue Keppra .   8.  GERD - Continue PPI twice daily.   9.  Hypomagnesemia/hypophosphatemia/hypokalemia - Magnesium  at 1.3 from 0.9.   - Phosphorus at 1.5.   -  Potassium at 3.2.   - K-Phos 45 mmol IV x 1.   -K. Dur 40 mEq p.o. every 4 hours x 2 doses. -Repeat labs  in the AM.  10.  Anemia -Patient with no overt bleeding. - Follow H&H.  11.  Thrombocytopenia -Follow.   DVT prophylaxis: SCDs Code Status: Full Family Communication: Updated patient.  No family at bedside. Disposition: Transfer to telemetry.  Status is: Inpatient Remains inpatient appropriate because: Severity of illness   Consultants:  Orthopedics: Dr. Germaine 06/24/2024 ID: Dr. Dennise 06/24/2024  Procedures:  Plain films of the right ankle 06/23/2024 Chest x-ray 06/23/2024 MRI right ankle 06/24/2024 pending  Antimicrobials:  Anti-infectives (From admission, onward)    Start     Dose/Rate Route Frequency Ordered Stop   06/24/24 1400  piperacillin -tazobactam (ZOSYN ) IVPB 3.375 g  Status:  Discontinued        3.375 g 12.5 mL/hr over 240 Minutes Intravenous Every 8 hours 06/24/24 0631 06/24/24 1144   06/24/24 1400  DAPTOmycin  (CUBICIN ) IVPB 500 mg/50mL premix        8 mg/kg  63.5 kg 100 mL/hr over 30 Minutes Intravenous Daily 06/24/24 1144     06/24/24 1200  vancomycin  (VANCOCIN ) IVPB 1000 mg/200 mL premix  Status:  Discontinued        1,000 mg 200 mL/hr over 60 Minutes Intravenous Every 12 hours 06/24/24 0515 06/24/24 1144   06/24/24 1200  Ampicillin -Sulbactam (UNASYN ) 3 g in sodium chloride  0.9 % 100 mL IVPB        3 g 200 mL/hr over 30 Minutes Intravenous Every 6 hours 06/24/24 1144     06/24/24 1000  metroNIDAZOLE  (FLAGYL ) IVPB 500 mg  Status:  Discontinued        500 mg 100 mL/hr over 60 Minutes Intravenous Every 12 hours 06/24/24 0437 06/24/24 0631   06/24/24 0600  ceFEPIme  (MAXIPIME ) 2 g in sodium chloride  0.9 % 100 mL IVPB  Status:  Discontinued        2 g 200 mL/hr over 30 Minutes Intravenous Every 8 hours 06/24/24 0437 06/24/24 0631   06/23/24 2315  vancomycin  (VANCOREADY) IVPB 1250 mg/250 mL        1,250 mg 166.7 mL/hr over 90 Minutes Intravenous  Once 06/23/24 2300 06/24/24 0133   06/23/24 2300  ceFEPIme  (MAXIPIME ) 2 g in sodium chloride  0.9 % 100 mL  IVPB        2 g 200 mL/hr over 30 Minutes Intravenous  Once 06/23/24 2258 06/23/24 2348   06/23/24 2300  metroNIDAZOLE  (FLAGYL ) IVPB 500 mg        500 mg 100 mL/hr over 60 Minutes Intravenous  Once 06/23/24 2258 06/24/24 0042   06/23/24 2300  vancomycin  (VANCOCIN ) IVPB 1000 mg/200 mL premix  Status:  Discontinued        1,000 mg 200 mL/hr over 60 Minutes Intravenous  Once 06/23/24 2258 06/23/24 2300         Subjective: Patient sitting up in bed.  States improvement with weakness.  Denies any chest pain or shortness of breath.  No abdominal pain.  States appetite is slowly picking up.  Still with complaints of significant right ankle pain.  Feels swelling has improved.  Patient at this time does not want an amputation but may consider it if right ankle swelling/osteomyelitis worsens.    Objective: Vitals:   06/24/24 2356 06/25/24 0310 06/25/24 0600 06/25/24 0755  BP: (!) 148/88 (!) 141/88  113/78  Pulse: (!) 101 90 88 (!) 102  Resp:  15 16  15   Temp: 98.5 F (36.9 C) 98.3 F (36.8 C)  97.9 F (36.6 C)  TempSrc: Oral Oral  Oral  SpO2: 98% 100%  99%  Weight:      Height:        Intake/Output Summary (Last 24 hours) at 06/25/2024 1012 Last data filed at 06/24/2024 2344 Gross per 24 hour  Intake --  Output 2750 ml  Net -2750 ml   Filed Weights   06/24/24 0400  Weight: 63.5 kg    Examination:  General exam: NAD. Respiratory system: CTAB.  No wheezes, no crackles, no rhonchi.  Fair air movement.  Speaking in full sentences.  No use of accessory muscles of respiration. Cardiovascular system: Regular rate rhythm no murmurs rubs or gallops.  No JVD.  No lower extremity edema.  Gastrointestinal system: Abdomen is soft, nontender, nondistended, positive bowel sounds.  No rebound.  No guarding.  Central nervous system: Alert and oriented. No focal neurological deficits. Extremities: Right ankle swelling slowly improving.  Right ankle with some tenderness to palpation. Skin: No  rashes, lesions or ulcers Psychiatry: Judgement and insight appear normal. Mood & affect appropriate.     Data Reviewed: I have personally reviewed following labs and imaging studies  CBC: Recent Labs  Lab 06/23/24 2107 06/24/24 0547 06/25/24 0616  WBC 8.3 6.7 6.3  NEUTROABS 5.5  --  3.9  HGB 9.6* 8.0* 8.6*  HCT 32.0* 25.8* 26.7*  MCV 80.4 77.5* 75.4*  PLT 120* 88* 71*    Basic Metabolic Panel: Recent Labs  Lab 06/23/24 2107 06/24/24 0547 06/24/24 0905 06/25/24 0616  NA 133* 133*  --  130*  K 4.1 3.7  --  3.2*  CL 93* 94*  --  91*  CO2 15* 22  --  26  GLUCOSE 162* 191*  --  127*  BUN 10 7  --  <5*  CREATININE 0.67 0.46*  --  0.54*  CALCIUM  8.6* 8.0*  --  8.2*  MG  --   --  0.9* 1.3*  PHOS  --   --   --  1.5*    GFR: Estimated Creatinine Clearance: 91.5 mL/min (A) (by C-G formula based on SCr of 0.54 mg/dL (L)).  Liver Function Tests: Recent Labs  Lab 06/23/24 2107 06/24/24 0547 06/25/24 0616  AST 154* 121*  --   ALT 31 27  --   ALKPHOS 411* 343*  --   BILITOT 3.1* 2.4*  --   PROT 8.9* 7.5  --   ALBUMIN 3.5 3.1* 3.2*    CBG: Recent Labs  Lab 06/24/24 0741 06/24/24 1207 06/24/24 1646 06/25/24 0321 06/25/24 0752  GLUCAP 174* 167* 76 103* 163*     Recent Results (from the past 240 hours)  Culture, blood (single)     Status: None (Preliminary result)   Collection Time: 06/23/24 10:57 PM   Specimen: BLOOD LEFT ARM  Result Value Ref Range Status   Specimen Description BLOOD LEFT ARM  Final   Special Requests   Final    BOTTLES DRAWN AEROBIC AND ANAEROBIC Blood Culture adequate volume   Culture   Final    NO GROWTH 1 DAY Performed at [facility], [address], [place], [place] [address]    Report Status PENDING  Incomplete  Culture, blood (Routine X 2) w Reflex to ID Panel     Status: None (Preliminary result)   Collection Time: 06/24/24  8:07 AM   Specimen: BLOOD RIGHT ARM  Result  Value Ref Range Status   Specimen Description  BLOOD RIGHT ARM  Final   Special Requests   Final    BOTTLES DRAWN AEROBIC AND ANAEROBIC Blood Culture results may not be optimal due to an inadequate volume of blood received in culture bottles   Culture   Final    NO GROWTH < 24 HOURS Performed at [facility], [address], [place], [place] [address]    Report Status PENDING  Incomplete  Culture, blood (Routine X 2) w Reflex to ID Panel     Status: None (Preliminary result)   Collection Time: 06/24/24  8:12 AM   Specimen: BLOOD RIGHT HAND  Result Value Ref Range Status   Specimen Description BLOOD RIGHT HAND  Final   Special Requests   Final    BOTTLES DRAWN AEROBIC AND ANAEROBIC Blood Culture results may not be optimal due to an inadequate volume of blood received in culture bottles   Culture   Final    NO GROWTH < 24 HOURS Performed at [facility], [address], [place], [place] [address]    Report Status PENDING  Incomplete         Radiology Studies: MR ANKLE RIGHT W WO CONTRAST Result Date: 06/25/2024 EXAM: MRI of the right Ankle with and without contrast. 06/24/2024 05:47:00 AM TECHNIQUE: Multiplanar multisequence MRI of the right ankle was performed with and without the administration of 7 mL of gadobutrol  (GADAVIST ) 1 MMOL/ML intravenous contrast. COMPARISON: None available. CLINICAL HISTORY: Osteomyelitis, ankle. FINDINGS: SYNDESMOTIC LIGAMENTS: Intact anterior inferior tibiofibular ligament and posterior inferior tibiofibular ligament. No significant periligamentous edema or interval widening. LATERAL COLLATERAL LIGAMENT COMPLEX: Intact anterior talofibular ligament, posterior talofibular ligament and calcaneofibular ligament. DELTOID LIGAMENT COMPLEX: Intact superficial and deep components. SINUS TARSI AND SPRING LIGAMENT: Small effusion of the posterior subtalar joint anteromedially with edema in the sinus tali. Visualized portions of the spring ligament are intact. MEDIAL TENDONS: Deficient tibialis  posterior tendon is likely torn in the vicinity of the medial malleolus. Flexor hallucis longus tendon sheath enhancement, for example on image 21 series 8, cannot exclude infectious tenosynovitis. Intact flexor digitorum longus tendon. LATERAL TENDONS: Intact peroneus brevis and longus tendons. ANTERIOR TENDONS: The tibialis anterior, extensor hallucis longus and extensor digitorum longus tendons are normal in position, morphology and signal. ACHILLES TENDON: Mild distal achilles tendinopathy. PLANTAR FASCIA: The medial and lateral bundles of the plantar fascia are normal in morphology and signal. There is no acute plantar fasciitis or tear. No plantar fascial nodules. TARSAL TUNNEL: Flexor hallucis longus tendon sheath enhancement, for example on image 21 series 8, cannot exclude infectious tenosynovitis. BONE MARROW: Previous fracture or dislocation at the ankle with extensive residual deformity. Bony resorption of portions of the talar dome and tibial plafond with surrounding marrow enhancement compatible with active osteomyelitis. 2 lag screws in the medial malleolus traverse the persistent fracture, with lucency around the screws on conventional radiography favoring infection. The joint effusion extends into the fracture plane of the distal fibula with surrounding distal fibular enhancement compatible with osteomyelitis. Appearance compatible with septic tibiotalar joint with distal tibial, distal fibular, and talar osteomyelitis. Substantial marrow edema is present in the dorsum of the ankle and foot. JOINT SPACES: Previous fracture or dislocation at the ankle with extensive residual deformity, approximately 1 cm of lateral displacement of the talus with respect to the tibial shaft. Complex tibiotalar joint effusion with severe synovitis suggesting septic joint. Small effusion of the posterior subtalar joint anteromedially. SOFT TISSUES:  Cellulitis not excluded. Cannot exclude lateral draining sinus tract  from the suspected septic joint. Small subcutaneous fluid collection with enhancing margin medial to the proximal margins of the medial malleolar screws shown on image 14 series 8, consistent with peri-malleolar infection. IMPRESSION: 1. Septic tibiotalar arthritis with osteomyelitis involving the distal tibia, distal fibula, and talus. 2. Subcutaneous fluid collection medial to the medial malleolar screws, compatible with infection. 3. Possible lateral sinus tract extending from the tibiotalar joint to the cutaneous surface. Correlate with any visible drainage. 4. Flexor hallucis longus tendon sheath enhancement with possible infectious tenosynovitis. 5. Small posterior subtalar joint effusion anteromedially with mild sinus tarsi edema. 6. Chronic posttraumatic ankle deformity with persistent fractures and medial malleolar screw fixation. 7. Tibialis posterior tendon tear near the medial malleolus. 8. Mild distal achilles tendinopathy. Electronically signed by: Ryan Salvage MD 06/25/2024 09:12 AM EST RP Workstation: [id number]   DG Ankle Complete Right Result Date: 06/23/2024 EXAM: 3 OR MORE VIEW(S) XRAY OF THE ANKLE 06/23/2024 11:43:13 PM CLINICAL HISTORY: Chronic osteo, worsening pain, sepsis COMPARISON: 04/23/2024 FINDINGS: BONES AND JOINTS: Stable ORIF of medial malleolus. Extensive bony destruction of distal tibia and fibula. Fragmentation of medial malleolus similar to previous exam. Increasing periprosthetic lucencies surrounding the lag screws within the medial malleolus. Displacement of the retaining button along the medial cortex of the distal tibia in keeping with disruption of the type of reconstruction. Increasing erosion along the fracture margins, within the talar dome, and within the distal tibia and medial malleolar fracture fragments in keeping with progressive changes of osteomyelitis. Osseous structures are diffusely osteopenic in keeping with disease. Stable lateral dislocation of the  talar dome in relation to the tibial plafond. Stable lateral subluxation at tibiotalar articulation. SOFT TISSUES: Extensive surrounding soft tissue swelling. Soft tissue wound now seen with superficial ulceration lateral tibia distal tibial callus. IMPRESSION: 1. Progressive osteomyelitis involving the distal tibia, fibula, and medial malleolus, with involvement of the talar dome. Progressive periprosthetic erosion. Persistent changes in keeping with disruption of the syndesmotic reconstruction. 2. Stable lateral dislocation of the talar dome relative to the tibial plafond with stable lateral subluxation at the tibiotalar articulation. 3. Extensive surrounding soft tissue swelling with a lateral soft tissue wound and superficial ulceration adjacent to the distal tibial callus. 4. Diffuse osteopenia. Electronically signed by: Dorethia Molt MD 06/23/2024 11:55 PM EST RP Workstation: [id number]   DG Chest Port 1 View Result Date: 06/23/2024 EXAM: 1 VIEW(S) XRAY OF THE CHEST 06/23/2024 11:37:00 PM COMPARISON: 02/28/2024 CLINICAL HISTORY: SOB (shortness of breath) FINDINGS: LUNGS AND PLEURA: No focal pulmonary opacity. No pleural effusion. No pneumothorax. HEART AND MEDIASTINUM: No acute abnormality of the cardiac and mediastinal silhouettes. BONES AND SOFT TISSUES: No acute osseous abnormality. IMPRESSION: 1. No acute cardiopulmonary abnormality. Electronically signed by: Dorethia Molt MD 06/23/2024 11:48 PM EST RP Workstation: [id number]        Scheduled Meds:  atorvastatin   20 mg Oral Daily   folic acid   1 mg Oral Daily   heparin   5,000 Units Subcutaneous Q8H   insulin  aspart  0-6 Units Subcutaneous TID WC   insulin  aspart  10 Units Subcutaneous TID WC   insulin  glargine  20 Units Subcutaneous Daily   levETIRAcetam   1,000 mg Oral BID   LORazepam   0-4 mg Oral Q6H   Followed by   NOREEN ON 06/26/2024] LORazepam   0-4 mg Oral Q12H   metoprolol  succinate  12.5 mg Oral Daily   multivitamin with  minerals  1 tablet Oral Daily  pantoprazole   40 mg Oral BID   potassium chloride   40 mEq Oral Q4H   thiamine   100 mg Oral Daily   Or   thiamine   100 mg Intravenous Daily   Continuous Infusions:  ampicillin -sulbactam (UNASYN ) IV 3 g (06/25/24 0630)   DAPTOmycin  Stopped (06/24/24 1413)   magnesium  sulfate bolus IVPB     potassium PHOSPHATE  IVPB (in mmol)       LOS: 1 day    Time spent: 40 minutes    Toribio Hummer, MD [facility]   To contact the attending provider between 7A-7P or the covering provider during after hours 7P-7A, please log into the web site [email or web address] and access using universal [place] password for that web site. If you do not have the password, please call the hospital operator.  06/25/2024, 10:12 AM    "

## 2024-06-25 NOTE — Evaluation (Signed)
 Physical Therapy Brief Evaluation and Discharge Note Patient Details Name: Gary Mitchell MRN: [id number] DOB: [birth year] Today's Date: 06/25/2024   History of Present Illness  53 yo M adm 06/23/24 with increased Rt ankle pain, sepsis, chronic Rt ankle osteomyelitis with BKA recommended but pt declining. PMH: Rt ankle fx ORIF 01/06/24 with infected hardware removal 02/04/24, EtOH abuse, hepatitis, seizure d/o, DM2, neuropathy, PAF, pancreatitis, cirrhosis, coagulopathy, depression  Clinical Impression  Pt lives at home with mom who he states does not assist. Pt walks with crutches limited distance due to ankle pain but reports performing ADLs and IADLs with assist to get groceries. This date pt performing transfers and gait without physical assist and supervision only for lines. Pt at baseline function without further therapy needs at this time, will sign off.      PT Assessment Patient does not need any further PT services  Assistance Needed at Discharge  PRN    Equipment Recommendations None recommended by PT  Recommendations for Other Services       Precautions/Restrictions Precautions Precautions: Fall Recall of Precautions/Restrictions: Intact Restrictions Weight Bearing Restrictions Per Provider Order: No        Mobility  Bed Mobility   Supine/Sidelying to sit: Modified independent (Device/Increased time) Sit to supine/sidelying: Modified independent (Device/Increased time)    Transfers Overall transfer level: Modified independent                      Ambulation/Gait Ambulation/Gait assistance: Supervision Gait Distance (Feet): 300 Feet Assistive device: Rolling walker (2 wheels) Gait Pattern/deviations: Step-through pattern, Decreased stride length, Trunk flexed Gait Speed: Below normal General Gait Details: cues for posture and proximity to RW  Home Activity Instructions    Stairs            Modified Rankin (Stroke Patients Only)         Balance Overall balance assessment: Needs assistance Sitting-balance support: No upper extremity supported, Feet supported Sitting balance-Leahy Scale: Good     Standing balance support: No upper extremity supported, Bilateral upper extremity supported Standing balance-Leahy Scale: Good Standing balance comment: can static stand without support, RW for gait to off weight RLE          Pertinent Vitals/Pain PT - Brief Vital Signs All Vital Signs Stable: Yes Pain Assessment Pain Assessment: 0-10 Pain Score: 4  Pain Location: Rt ankle Pain Descriptors / Indicators: Sore Pain Intervention(s): Limited activity within patient's tolerance, Monitored during session, Repositioned     Home Living Family/patient expects to be discharged to:: Private residence Living Arrangements: Other relatives;Parent Available Help at Discharge: Family;Available PRN/intermittently Home Environment: Stairs to enter  Stairs-Number of Steps: 4 Home Equipment: Shower seat;Wheelchair - Forensic Psychologist (2 wheels);Crutches   Additional Comments: Pt reports wheelchair is broken    Prior Function Level of Independence: Independent with assistive device(s) Comments: walks with crutches, has someone go pick up groceries, does not drive    UE/LE Assessment   UE ROM/Strength/Tone/Coordination: WFL    LE ROM/Strength/Tone/Coordination: [facility]      Communication   Communication Communication: No apparent difficulties     Cognition Overall Cognitive Status: Appears within functional limits for tasks assessed/performed       General Comments      Exercises     Assessment/Plan    PT Problem List         PT Visit Diagnosis Other abnormalities of gait and mobility (R26.89);Difficulty in walking, not elsewhere classified (R26.2)    No Skilled PT  Patient at baseline level of functioning;Patient is modified independent with all activity/mobility;All education completed   Co-evaluation                 AMPAC 6 Clicks Help needed turning from your back to your side while in a flat bed without using bedrails?: None Help needed moving from lying on your back to sitting on the side of a flat bed without using bedrails?: None Help needed moving to and from a bed to a chair (including a wheelchair)?: None Help needed standing up from a chair using your arms (e.g., wheelchair or bedside chair)?: None Help needed to walk in hospital room?: A Little Help needed climbing 3-5 steps with a railing? : A Little 6 Click Score: 22      End of Session   Activity Tolerance: Patient tolerated treatment well Patient left: in bed;with call bell/phone within reach;with bed alarm set Nurse Communication: Mobility status PT Visit Diagnosis: Other abnormalities of gait and mobility (R26.89);Difficulty in walking, not elsewhere classified (R26.2)     Time: 8671-8657 PT Time Calculation (min) (ACUTE ONLY): 14 min  Charges:   PT Evaluation $PT Eval Low Complexity: 1 Low      Carmine Youngberg P, PT Acute Rehabilitation Services Office: [phone]   Lenoard NOVAK Macil Crady  06/25/2024, 1:59 PM

## 2024-06-25 NOTE — Progress Notes (Signed)
 OT Cancellation Note  Patient Details Name: MISHAEL HARAN MRN: [id number] DOB: 04-02-1971   Cancelled Treatment:    Reason Eval/Treat Not Completed: OT screened, no needs identified, will sign off.  Communicated from primary PT that no acute rehab needs exist.  Patient is functioning at his baseline.    Lavonia Eager D Alexzandrea Normington 06/25/2024, 2:10 PM 06/25/2024  RP, OTR/L  Acute Rehabilitation Services  Office:  [phone]

## 2024-06-25 NOTE — Progress Notes (Signed)
 Patient is refusing treatment and pulled out his IV and pulled off cardiac monitor. Charge nurse notified of patient refusing care. Importance of care explained to patient from clinical research associate and press photographer. Provider Andrez notified of patient refusing care. Provider Andrez speaking to patient about the importance of IV antibiotic therapy. Provider A Andrez went into great detail about risk and benefits of his care. Despite staff discussing benefits of continuing care patient still refused. Patient also refused to sign AMA form and stated to staff that he would not leave and that he would knock on random doors on unit. Security notified, patient ambulated off unit.

## 2024-06-25 NOTE — Progress Notes (Signed)
 Report given to nurse receiving pt on 5N. Pt transported to 5N09 in wheelchair. Pt belongings at bedside.

## 2024-06-29 LAB — CULTURE, BLOOD (ROUTINE X 2)
Culture: NO GROWTH
Culture: NO GROWTH

## 2024-06-29 LAB — CULTURE, BLOOD (SINGLE)
Culture: NO GROWTH
Special Requests: ADEQUATE

## 2024-06-30 ENCOUNTER — Telehealth: Payer: Self-pay

## 2024-06-30 DIAGNOSIS — M86671 Other chronic osteomyelitis, right ankle and foot: Secondary | ICD-10-CM | POA: Diagnosis not present

## 2024-06-30 DIAGNOSIS — F419 Anxiety disorder, unspecified: Secondary | ICD-10-CM | POA: Diagnosis present

## 2024-06-30 DIAGNOSIS — Z79899 Other long term (current) drug therapy: Secondary | ICD-10-CM

## 2024-06-30 DIAGNOSIS — Z5329 Procedure and treatment not carried out because of patient's decision for other reasons: Secondary | ICD-10-CM | POA: Diagnosis not present

## 2024-06-30 DIAGNOSIS — Z794 Long term (current) use of insulin: Secondary | ICD-10-CM

## 2024-06-30 DIAGNOSIS — F10220 Alcohol dependence with intoxication, uncomplicated: Secondary | ICD-10-CM | POA: Diagnosis present

## 2024-06-30 DIAGNOSIS — Z7984 Long term (current) use of oral hypoglycemic drugs: Secondary | ICD-10-CM

## 2024-06-30 DIAGNOSIS — M009 Pyogenic arthritis, unspecified: Secondary | ICD-10-CM | POA: Diagnosis present

## 2024-06-30 DIAGNOSIS — K7030 Alcoholic cirrhosis of liver without ascites: Secondary | ICD-10-CM | POA: Diagnosis present

## 2024-06-30 DIAGNOSIS — E1165 Type 2 diabetes mellitus with hyperglycemia: Secondary | ICD-10-CM | POA: Diagnosis present

## 2024-06-30 DIAGNOSIS — R9431 Abnormal electrocardiogram [ECG] [EKG]: Secondary | ICD-10-CM | POA: Diagnosis not present

## 2024-06-30 DIAGNOSIS — E871 Hypo-osmolality and hyponatremia: Secondary | ICD-10-CM | POA: Diagnosis not present

## 2024-06-30 DIAGNOSIS — Z888 Allergy status to other drugs, medicaments and biological substances status: Secondary | ICD-10-CM

## 2024-06-30 DIAGNOSIS — D696 Thrombocytopenia, unspecified: Secondary | ICD-10-CM | POA: Diagnosis present

## 2024-06-30 DIAGNOSIS — K219 Gastro-esophageal reflux disease without esophagitis: Secondary | ICD-10-CM | POA: Diagnosis present

## 2024-06-30 DIAGNOSIS — Z789 Other specified health status: Secondary | ICD-10-CM

## 2024-06-30 DIAGNOSIS — S82891G Other fracture of right lower leg, subsequent encounter for closed fracture with delayed healing: Secondary | ICD-10-CM

## 2024-06-30 DIAGNOSIS — I4891 Unspecified atrial fibrillation: Secondary | ICD-10-CM | POA: Diagnosis present

## 2024-06-30 DIAGNOSIS — K7010 Alcoholic hepatitis without ascites: Secondary | ICD-10-CM | POA: Diagnosis present

## 2024-06-30 DIAGNOSIS — F32A Depression, unspecified: Secondary | ICD-10-CM | POA: Diagnosis present

## 2024-06-30 DIAGNOSIS — R569 Unspecified convulsions: Secondary | ICD-10-CM | POA: Diagnosis present

## 2024-06-30 DIAGNOSIS — I10 Essential (primary) hypertension: Secondary | ICD-10-CM | POA: Diagnosis present

## 2024-06-30 DIAGNOSIS — Z833 Family history of diabetes mellitus: Secondary | ICD-10-CM

## 2024-06-30 DIAGNOSIS — T847XXA Infection and inflammatory reaction due to other internal orthopedic prosthetic devices, implants and grafts, initial encounter: Secondary | ICD-10-CM | POA: Diagnosis present

## 2024-06-30 DIAGNOSIS — L02818 Cutaneous abscess of other sites: Secondary | ICD-10-CM | POA: Diagnosis present

## 2024-06-30 DIAGNOSIS — M25471 Effusion, right ankle: Secondary | ICD-10-CM | POA: Diagnosis present

## 2024-06-30 DIAGNOSIS — D509 Iron deficiency anemia, unspecified: Secondary | ICD-10-CM | POA: Diagnosis present

## 2024-06-30 DIAGNOSIS — Z87891 Personal history of nicotine dependence: Secondary | ICD-10-CM

## 2024-06-30 DIAGNOSIS — Z886 Allergy status to analgesic agent status: Secondary | ICD-10-CM

## 2024-06-30 DIAGNOSIS — B9562 Methicillin resistant Staphylococcus aureus infection as the cause of diseases classified elsewhere: Secondary | ICD-10-CM | POA: Diagnosis present

## 2024-06-30 DIAGNOSIS — Y908 Blood alcohol level of 240 mg/100 ml or more: Secondary | ICD-10-CM | POA: Diagnosis present

## 2024-06-30 DIAGNOSIS — E1169 Type 2 diabetes mellitus with other specified complication: Principal | ICD-10-CM | POA: Diagnosis present

## 2024-06-30 NOTE — Transitions of Care (Post Inpatient/ED Visit) (Signed)
" ° °  06/30/2024  Name: VIOLET CART MRN: [id number] DOB: 02/09/1971  Today's TOC FU Call Status: Today's TOC FU Call Status:: Unsuccessful Call (1st Attempt) Unsuccessful Call (1st Attempt) Date: 06/30/24  Attempted to reach the patient regarding the most recent Inpatient/ED visit.  Follow Up Plan: Additional outreach attempts will be made to reach the patient to complete the Transitions of Care (Post Inpatient/ED visit) call.   Signature  Slater Diesel, RN   "

## 2024-07-01 ENCOUNTER — Emergency Department (HOSPITAL_COMMUNITY)

## 2024-07-01 ENCOUNTER — Other Ambulatory Visit: Payer: Self-pay

## 2024-07-01 ENCOUNTER — Inpatient Hospital Stay (HOSPITAL_COMMUNITY)
Admission: EM | Admit: 2024-07-01 | Discharge: 2024-07-07 | DRG: 638 | Disposition: A | Discharge status: Home / Self Care | Attending: Nurse Practitioner | Admitting: Nurse Practitioner

## 2024-07-01 ENCOUNTER — Encounter (HOSPITAL_COMMUNITY): Payer: Self-pay | Admitting: Emergency Medicine

## 2024-07-01 DIAGNOSIS — Z794 Long term (current) use of insulin: Secondary | ICD-10-CM | POA: Diagnosis not present

## 2024-07-01 DIAGNOSIS — L02818 Cutaneous abscess of other sites: Secondary | ICD-10-CM | POA: Diagnosis present

## 2024-07-01 DIAGNOSIS — M8600 Acute hematogenous osteomyelitis, unspecified site: Secondary | ICD-10-CM

## 2024-07-01 DIAGNOSIS — E1169 Type 2 diabetes mellitus with other specified complication: Secondary | ICD-10-CM | POA: Diagnosis present

## 2024-07-01 DIAGNOSIS — M009 Pyogenic arthritis, unspecified: Secondary | ICD-10-CM | POA: Diagnosis present

## 2024-07-01 DIAGNOSIS — M7989 Other specified soft tissue disorders: Secondary | ICD-10-CM | POA: Diagnosis not present

## 2024-07-01 DIAGNOSIS — F32A Depression, unspecified: Secondary | ICD-10-CM | POA: Diagnosis present

## 2024-07-01 DIAGNOSIS — K219 Gastro-esophageal reflux disease without esophagitis: Secondary | ICD-10-CM | POA: Diagnosis present

## 2024-07-01 DIAGNOSIS — Z833 Family history of diabetes mellitus: Secondary | ICD-10-CM | POA: Diagnosis not present

## 2024-07-01 DIAGNOSIS — I4891 Unspecified atrial fibrillation: Secondary | ICD-10-CM | POA: Diagnosis present

## 2024-07-01 DIAGNOSIS — M86171 Other acute osteomyelitis, right ankle and foot: Secondary | ICD-10-CM | POA: Diagnosis not present

## 2024-07-01 DIAGNOSIS — M00871 Arthritis due to other bacteria, right ankle and foot: Secondary | ICD-10-CM | POA: Diagnosis not present

## 2024-07-01 DIAGNOSIS — E871 Hypo-osmolality and hyponatremia: Secondary | ICD-10-CM | POA: Diagnosis not present

## 2024-07-01 DIAGNOSIS — F10220 Alcohol dependence with intoxication, uncomplicated: Secondary | ICD-10-CM | POA: Diagnosis present

## 2024-07-01 DIAGNOSIS — M869 Osteomyelitis, unspecified: Secondary | ICD-10-CM | POA: Diagnosis present

## 2024-07-01 DIAGNOSIS — D509 Iron deficiency anemia, unspecified: Secondary | ICD-10-CM | POA: Diagnosis present

## 2024-07-01 DIAGNOSIS — K7030 Alcoholic cirrhosis of liver without ascites: Secondary | ICD-10-CM | POA: Diagnosis present

## 2024-07-01 DIAGNOSIS — F10920 Alcohol use, unspecified with intoxication, uncomplicated: Secondary | ICD-10-CM

## 2024-07-01 DIAGNOSIS — M86671 Other chronic osteomyelitis, right ankle and foot: Secondary | ICD-10-CM | POA: Diagnosis not present

## 2024-07-01 DIAGNOSIS — E1165 Type 2 diabetes mellitus with hyperglycemia: Secondary | ICD-10-CM | POA: Diagnosis present

## 2024-07-01 DIAGNOSIS — L0291 Cutaneous abscess, unspecified: Secondary | ICD-10-CM | POA: Diagnosis not present

## 2024-07-01 DIAGNOSIS — Z87891 Personal history of nicotine dependence: Secondary | ICD-10-CM | POA: Diagnosis not present

## 2024-07-01 DIAGNOSIS — D696 Thrombocytopenia, unspecified: Secondary | ICD-10-CM | POA: Diagnosis present

## 2024-07-01 DIAGNOSIS — Z789 Other specified health status: Secondary | ICD-10-CM | POA: Diagnosis not present

## 2024-07-01 DIAGNOSIS — Y908 Blood alcohol level of 240 mg/100 ml or more: Secondary | ICD-10-CM | POA: Diagnosis present

## 2024-07-01 DIAGNOSIS — Z7984 Long term (current) use of oral hypoglycemic drugs: Secondary | ICD-10-CM | POA: Diagnosis not present

## 2024-07-01 DIAGNOSIS — T8459XA Infection and inflammatory reaction due to other internal joint prosthesis, initial encounter: Secondary | ICD-10-CM | POA: Diagnosis not present

## 2024-07-01 DIAGNOSIS — R7989 Other specified abnormal findings of blood chemistry: Secondary | ICD-10-CM

## 2024-07-01 DIAGNOSIS — L02415 Cutaneous abscess of right lower limb: Secondary | ICD-10-CM | POA: Diagnosis not present

## 2024-07-01 DIAGNOSIS — K7010 Alcoholic hepatitis without ascites: Secondary | ICD-10-CM | POA: Diagnosis present

## 2024-07-01 DIAGNOSIS — T847XXA Infection and inflammatory reaction due to other internal orthopedic prosthetic devices, implants and grafts, initial encounter: Secondary | ICD-10-CM | POA: Diagnosis present

## 2024-07-01 DIAGNOSIS — R569 Unspecified convulsions: Secondary | ICD-10-CM | POA: Diagnosis present

## 2024-07-01 DIAGNOSIS — B9562 Methicillin resistant Staphylococcus aureus infection as the cause of diseases classified elsewhere: Secondary | ICD-10-CM | POA: Diagnosis present

## 2024-07-01 DIAGNOSIS — I10 Essential (primary) hypertension: Secondary | ICD-10-CM | POA: Diagnosis present

## 2024-07-01 LAB — CBC WITH DIFFERENTIAL/PLATELET
Abs Immature Granulocytes: 0.03 K/uL (ref 0.00–0.07)
Basophils Absolute: 0.1 K/uL (ref 0.0–0.1)
Basophils Relative: 2 %
Eosinophils Absolute: 0.2 K/uL (ref 0.0–0.5)
Eosinophils Relative: 3 %
HCT: 28.2 % — ABNORMAL LOW (ref 39.0–52.0)
Hemoglobin: 9 g/dL — ABNORMAL LOW (ref 13.0–17.0)
Immature Granulocytes: 0 %
Lymphocytes Relative: 30 %
Lymphs Abs: 2.4 K/uL (ref 0.7–4.0)
MCH: 23.8 pg — ABNORMAL LOW (ref 26.0–34.0)
MCHC: 31.9 g/dL (ref 30.0–36.0)
MCV: 74.6 fL — ABNORMAL LOW (ref 80.0–100.0)
Monocytes Absolute: 0.7 K/uL (ref 0.1–1.0)
Monocytes Relative: 9 %
Neutro Abs: 4.6 K/uL (ref 1.7–7.7)
Neutrophils Relative %: 56 %
Platelets: 107 K/uL — ABNORMAL LOW (ref 150–400)
RBC: 3.78 MIL/uL — ABNORMAL LOW (ref 4.22–5.81)
RDW: 28.2 % — ABNORMAL HIGH (ref 11.5–15.5)
Smear Review: NORMAL
WBC: 8.1 K/uL (ref 4.0–10.5)
nRBC: 0 % (ref 0.0–0.2)

## 2024-07-01 LAB — CK: Total CK: 77 U/L (ref 49–397)

## 2024-07-01 LAB — ETHANOL: Alcohol, Ethyl (B): 357 mg/dL

## 2024-07-01 LAB — COMPREHENSIVE METABOLIC PANEL WITH GFR
ALT: 45 U/L — ABNORMAL HIGH (ref 0–44)
AST: 143 U/L — ABNORMAL HIGH (ref 15–41)
Albumin: 3.3 g/dL — ABNORMAL LOW (ref 3.5–5.0)
Alkaline Phosphatase: 498 U/L — ABNORMAL HIGH (ref 38–126)
Anion gap: 15 (ref 5–15)
BUN: 8 mg/dL (ref 6–20)
CO2: 22 mmol/L (ref 22–32)
Calcium: 8.8 mg/dL — ABNORMAL LOW (ref 8.9–10.3)
Chloride: 101 mmol/L (ref 98–111)
Creatinine, Ser: 0.45 mg/dL — ABNORMAL LOW (ref 0.61–1.24)
GFR, Estimated: >60 mL/min
Glucose, Bld: 297 mg/dL — ABNORMAL HIGH (ref 70–99)
Potassium: 4.6 mmol/L (ref 3.5–5.1)
Sodium: 138 mmol/L (ref 135–145)
Total Bilirubin: 3.2 mg/dL — ABNORMAL HIGH (ref 0.0–1.2)
Total Protein: 9.8 g/dL — ABNORMAL HIGH (ref 6.5–8.1)

## 2024-07-01 LAB — GLUCOSE, CAPILLARY
Glucose-Capillary: 195 mg/dL — ABNORMAL HIGH (ref 70–99)
Glucose-Capillary: 135 mg/dL — ABNORMAL HIGH (ref 70–99)
Glucose-Capillary: 72 mg/dL (ref 70–99)
Glucose-Capillary: 161 mg/dL — ABNORMAL HIGH (ref 70–99)

## 2024-07-01 LAB — MAGNESIUM: Magnesium: 1.5 mg/dL — ABNORMAL LOW (ref 1.7–2.4)

## 2024-07-01 LAB — BETA-HYDROXYBUTYRIC ACID: Beta-Hydroxybutyric Acid: 0.1 mmol/L (ref 0.05–0.27)

## 2024-07-01 LAB — CBG MONITORING, ED: Glucose-Capillary: 310 mg/dL — ABNORMAL HIGH (ref 70–99)

## 2024-07-01 MED ORDER — FOLIC ACID 1 MG PO TABS
1.0000 mg | ORAL_TABLET | Freq: Every day | ORAL | Status: DC
  Administered 2024-07-02 – 2024-07-07 (×5): 1 mg via ORAL
  Filled 2024-07-01 (×3): qty 1

## 2024-07-01 MED ORDER — METOPROLOL TARTRATE 5 MG/5ML IV SOLN: 5.0000 mg | INTRAVENOUS | Status: DC | PRN

## 2024-07-01 MED ORDER — FERROUS SULFATE 325 (65 FE) MG PO TABS
325.0000 mg | ORAL_TABLET | Freq: Every day | ORAL | Status: DC
  Administered 2024-07-02 – 2024-07-04 (×3): 325 mg via ORAL
  Filled 2024-07-01 (×3): qty 1

## 2024-07-01 MED ORDER — HYDROCODONE-ACETAMINOPHEN 5-325 MG PO TABS
1.0000 | ORAL_TABLET | Freq: Four times a day (QID) | ORAL | Status: AC | PRN
  Administered 2024-07-01 – 2024-07-02 (×2): 1 via ORAL
  Filled 2024-07-01 (×2): qty 1

## 2024-07-01 MED ORDER — LORAZEPAM 2 MG/ML IJ SOLN: 0.0000 mg | Freq: Four times a day (QID) | INTRAMUSCULAR | Status: AC

## 2024-07-01 MED ORDER — ENOXAPARIN SODIUM 40 MG/0.4ML IJ SOSY
40.0000 mg | PREFILLED_SYRINGE | INTRAMUSCULAR | Status: DC
  Administered 2024-07-01 – 2024-07-02 (×2): 40 mg via SUBCUTANEOUS
  Filled 2024-07-01 (×2): qty 0.4

## 2024-07-01 MED ORDER — GLUCAGON HCL RDNA (DIAGNOSTIC) 1 MG IJ SOLR: 1.0000 mg | INTRAMUSCULAR | Status: DC | PRN

## 2024-07-01 MED ORDER — LEVETIRACETAM 500 MG PO TABS
1000.0000 mg | ORAL_TABLET | Freq: Two times a day (BID) | ORAL | Status: DC
  Administered 2024-07-01 – 2024-07-07 (×12): 1000 mg via ORAL
  Filled 2024-07-01 (×7): qty 2

## 2024-07-01 MED ORDER — MAGNESIUM SULFATE 2 GM/50ML IV SOLN
2.0000 g | Freq: Once | INTRAVENOUS | Status: AC
  Administered 2024-07-01: 2 g via INTRAVENOUS
  Filled 2024-07-01: qty 50

## 2024-07-01 MED ORDER — SENNOSIDES-DOCUSATE SODIUM 8.6-50 MG PO TABS
2.0000 | ORAL_TABLET | Freq: Every day | ORAL | Status: DC
  Administered 2024-07-01 – 2024-07-03 (×3): 2 via ORAL
  Filled 2024-07-01 (×3): qty 2

## 2024-07-01 MED ORDER — IPRATROPIUM-ALBUTEROL 0.5-2.5 (3) MG/3ML IN SOLN: 3.0000 mL | RESPIRATORY_TRACT | Status: DC | PRN

## 2024-07-01 MED ORDER — TRAZODONE HCL 50 MG PO TABS
50.0000 mg | ORAL_TABLET | Freq: Every evening | ORAL | Status: DC | PRN
  Administered 2024-07-05 – 2024-07-06 (×3): 50 mg via ORAL

## 2024-07-01 MED ORDER — DAPTOMYCIN-SODIUM CHLORIDE 500-0.9 MG/50ML-% IV SOLN
8.0000 mg/kg | Freq: Every day | INTRAVENOUS | Status: DC
  Administered 2024-07-01 – 2024-07-05 (×6): 500 mg via INTRAVENOUS
  Filled 2024-07-01 (×5): qty 50

## 2024-07-01 MED ORDER — LORAZEPAM 1 MG PO TABS: 0.0000 mg | ORAL_TABLET | Freq: Four times a day (QID) | ORAL | Status: AC

## 2024-07-01 MED ORDER — ADULT MULTIVITAMIN W/MINERALS CH
1.0000 | ORAL_TABLET | Freq: Every day | ORAL | Status: DC
  Administered 2024-07-02 – 2024-07-04 (×3): 1 via ORAL
  Filled 2024-07-01 (×3): qty 1

## 2024-07-01 MED ORDER — THIAMINE MONONITRATE 100 MG PO TABS: 100.0000 mg | ORAL_TABLET | Freq: Every day | ORAL | Status: DC

## 2024-07-01 MED ORDER — THIAMINE HCL 100 MG/ML IJ SOLN
100.0000 mg | Freq: Every day | INTRAMUSCULAR | Status: DC
  Filled 2024-07-01: qty 2

## 2024-07-01 MED ORDER — INSULIN ASPART 100 UNIT/ML IJ SOLN
0.0000 [IU] | Freq: Three times a day (TID) | INTRAMUSCULAR | Status: DC
  Administered 2024-07-01: 4 [IU] via SUBCUTANEOUS
  Administered 2024-07-01: 2 [IU] via SUBCUTANEOUS
  Administered 2024-07-02: 4 [IU] via SUBCUTANEOUS
  Administered 2024-07-02 – 2024-07-03 (×4): 2 [IU] via SUBCUTANEOUS
  Administered 2024-07-03: 8 [IU] via SUBCUTANEOUS
  Administered 2024-07-04: 12 [IU] via SUBCUTANEOUS
  Administered 2024-07-04: 4 [IU] via SUBCUTANEOUS
  Administered 2024-07-04: 2 [IU] via SUBCUTANEOUS
  Administered 2024-07-05 (×2): 4 [IU] via SUBCUTANEOUS
  Administered 2024-07-06: 12 [IU] via SUBCUTANEOUS
  Administered 2024-07-07: 2 [IU] via SUBCUTANEOUS
  Administered 2024-07-07: 12 [IU] via SUBCUTANEOUS
  Filled 2024-07-01: qty 2
  Filled 2024-07-01: qty 12
  Filled 2024-07-01 (×2): qty 2
  Filled 2024-07-01: qty 8
  Filled 2024-07-01: qty 2
  Filled 2024-07-01: qty 4
  Filled 2024-07-01: qty 2
  Filled 2024-07-01: qty 4
  Filled 2024-07-01: qty 2
  Filled 2024-07-01: qty 4

## 2024-07-01 MED ORDER — LORAZEPAM 2 MG/ML IJ SOLN: 0.0000 mg | Freq: Two times a day (BID) | INTRAMUSCULAR | Status: AC

## 2024-07-01 MED ORDER — PANTOPRAZOLE SODIUM 40 MG PO TBEC
40.0000 mg | DELAYED_RELEASE_TABLET | Freq: Two times a day (BID) | ORAL | Status: DC
  Administered 2024-07-01 – 2024-07-07 (×11): 40 mg via ORAL
  Filled 2024-07-01 (×6): qty 1

## 2024-07-01 MED ORDER — THIAMINE MONONITRATE 100 MG PO TABS
100.0000 mg | ORAL_TABLET | Freq: Every day | ORAL | Status: DC
  Administered 2024-07-02 – 2024-07-07 (×5): 100 mg via ORAL
  Filled 2024-07-01 (×3): qty 1

## 2024-07-01 MED ORDER — ONDANSETRON HCL 4 MG PO TABS: 4.0000 mg | ORAL_TABLET | Freq: Four times a day (QID) | ORAL | Status: DC | PRN

## 2024-07-01 MED ORDER — ONDANSETRON HCL 4 MG/2ML IJ SOLN
4.0000 mg | Freq: Four times a day (QID) | INTRAMUSCULAR | Status: DC | PRN
  Administered 2024-07-05: 4 mg via INTRAVENOUS

## 2024-07-01 MED ORDER — PIPERACILLIN-TAZOBACTAM 3.375 G IVPB 30 MIN
3.3750 g | Freq: Once | INTRAVENOUS | Status: AC
  Administered 2024-07-01: 3.375 g via INTRAVENOUS
  Filled 2024-07-01: qty 50

## 2024-07-01 MED ORDER — IRON SUCROSE 300 MG IVPB - SIMPLE MED
300.0000 mg | Freq: Once | Status: AC
  Administered 2024-07-01: 300 mg via INTRAVENOUS
  Filled 2024-07-01: qty 300

## 2024-07-01 MED ORDER — HYDRALAZINE HCL 20 MG/ML IJ SOLN: 10.0000 mg | INTRAMUSCULAR | Status: DC | PRN

## 2024-07-01 MED ORDER — LORAZEPAM 1 MG PO TABS: 1.0000 mg | ORAL_TABLET | ORAL | Status: AC | PRN

## 2024-07-01 MED ORDER — GUAIFENESIN 100 MG/5ML PO LIQD: 5.0000 mL | ORAL | Status: DC | PRN

## 2024-07-01 MED ORDER — DEXTROSE-SODIUM CHLORIDE 5-0.45 % IV SOLN: INTRAVENOUS | Status: AC

## 2024-07-01 MED ORDER — THIAMINE HCL 100 MG/ML IJ SOLN
100.0000 mg | Freq: Every day | INTRAMUSCULAR | Status: DC
  Administered 2024-07-01: 100 mg via INTRAVENOUS

## 2024-07-01 MED ORDER — SENNOSIDES-DOCUSATE SODIUM 8.6-50 MG PO TABS: 1.0000 | ORAL_TABLET | Freq: Every evening | ORAL | Status: DC | PRN

## 2024-07-01 MED ORDER — LORAZEPAM 2 MG/ML IJ SOLN: 1.0000 mg | INTRAMUSCULAR | Status: AC | PRN

## 2024-07-01 MED ORDER — LEVETIRACETAM (KEPPRA) 500 MG/5 ML ADULT IV PUSH
1000.0000 mg | Freq: Once | INTRAVENOUS | Status: AC
  Administered 2024-07-01: 1000 mg via INTRAVENOUS
  Filled 2024-07-01: qty 10

## 2024-07-01 MED ORDER — ACETAMINOPHEN 325 MG PO TABS
650.0000 mg | ORAL_TABLET | Freq: Four times a day (QID) | ORAL | Status: DC | PRN
  Administered 2024-07-05 – 2024-07-07 (×4): 650 mg via ORAL

## 2024-07-01 MED ORDER — METOPROLOL SUCCINATE ER 25 MG PO TB24
12.5000 mg | ORAL_TABLET | Freq: Every day | ORAL | Status: DC
  Administered 2024-07-02 – 2024-07-07 (×5): 12.5 mg via ORAL
  Filled 2024-07-01 (×3): qty 1

## 2024-07-01 MED ORDER — SODIUM CHLORIDE 0.9 % IV BOLUS
1000.0000 mL | Freq: Once | INTRAVENOUS | Status: AC
  Administered 2024-07-01: 1000 mL via INTRAVENOUS

## 2024-07-01 MED ORDER — ACETAMINOPHEN 650 MG RE SUPP: 650.0000 mg | Freq: Four times a day (QID) | RECTAL | Status: DC | PRN

## 2024-07-01 MED ORDER — LORAZEPAM 1 MG PO TABS: 0.0000 mg | ORAL_TABLET | Freq: Two times a day (BID) | ORAL | Status: AC

## 2024-07-01 MED ORDER — LORAZEPAM 2 MG/ML IJ SOLN
0.0000 mg | Freq: Four times a day (QID) | INTRAMUSCULAR | Status: AC
  Administered 2024-07-01: 2 mg via INTRAVENOUS
  Filled 2024-07-01: qty 1

## 2024-07-01 MED ORDER — SODIUM CHLORIDE 0.9 % IV SOLN
3.0000 g | Freq: Four times a day (QID) | INTRAVENOUS | Status: DC
  Administered 2024-07-01 – 2024-07-05 (×18): 3 g via INTRAVENOUS
  Filled 2024-07-01 (×17): qty 8

## 2024-07-01 MED ORDER — INSULIN GLARGINE-YFGN 100 UNIT/ML ~~LOC~~ SOLN
28.0000 [IU] | Freq: Every day | SUBCUTANEOUS | Status: DC
  Administered 2024-07-01: 28 [IU] via SUBCUTANEOUS
  Filled 2024-07-01 (×2): qty 0.28

## 2024-07-01 NOTE — H&P (Signed)
 " History and Physical    Gary Mitchell FMW:[id number] DOB: Nov 26, 1970 DOA: 07/01/2024  PCP: Delbert Clam, MD   Chief Complaint: foot pain  HPI: Gary Mitchell is a 53 y.o. male with medical history significant of alcoholic hepatitis, A-fib, cirrhosis, type 2 diabetes, seizure who denting to emergency department due to poorly controlled blood sugar and lack of medications.  Patient reportedly has not been feeling well and was having difficulty articulating symptoms.  He was endorsing swelling of his right ankle since he had a fracture 6 months ago.  He has been actively drinking.  He presented to the emergency department where he was found to be afebrile hemodynamically stable.  Labs were obtained on presentation which showed alcohol  level 357, magnesium  1.5, WBC 8.1, hemoglobin 9.0, platelets 107, AST 143, ALT 45.  Patient underwent x-ray of ankle which showed persistent osteomyelitis of the tibia and fibula.  Patient was admitted for further workup.  Of note patient was recently admitted on 12/18.  During this presentation he presented with alcohol  usage and was being treated for chronic osteo.  Infectious disease was consulted and recommended Unasyn  and daptomycin .  MRI was obtained at that time which showed persistent osteomyelitis.  Patient eventually left AGAINST MEDICAL ADVICE.  He presented to the ER today for reevaluation of care.   Review of Systems: Review of Systems  Constitutional:  Negative for chills and fever.  HENT: Negative.    Eyes:  Positive for blurred vision.  Respiratory: Negative.    Cardiovascular: Negative.   Gastrointestinal: Negative.   Genitourinary: Negative.   Musculoskeletal: Negative.   Skin: Negative.   Neurological: Negative.   Endo/Heme/Allergies: Negative.   Psychiatric/Behavioral: Negative.    All other systems reviewed and are negative.    As per HPI otherwise 10 point review of systems negative.   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   Seizures (HCC)    Seizures (HCC)    Thrombocytopenia     Past Surgical History:  Procedure Laterality Date   BIOPSY  08/02/2021   Procedure: BIOPSY;  Surgeon: Teressa Toribio SQUIBB, MD;  Location: [facility];  Service: Endoscopy;;   BIOPSY  05/07/2022   Procedure: BIOPSY;  Surgeon: Stacia Glendia BRAVO, MD;  Location: [facility];  Service: Gastroenterology;;   BONE BIOPSY  03/01/2024   Procedure: BIOPSY, GI;  Surgeon: Leigh Elspeth SQUIBB, MD;  Location: [facility]COPY;  Service: Gastroenterology;;   COLONOSCOPY WITH PROPOFOL  N/A 05/08/2022   Procedure: COLONOSCOPY WITH PROPOFOL ;  Surgeon: Stacia Glendia BRAVO, MD;  Location: [facility];  Service: Gastroenterology;  Laterality: N/A;   ENTEROSCOPY N/A 09/20/2020   Procedure: ENTEROSCOPY;  Surgeon: Eda Iha, MD;  Location: [facility] ENDOSCOPY;  Service: Gastroenterology;  Laterality: N/A;   ENTEROSCOPY N/A 05/11/2022   Procedure: ENTEROSCOPY;  Surgeon: Stacia,  Glendia BRAVO, MD;  Location: [facility];  Service: Gastroenterology;  Laterality: N/A;   ESOPHAGOGASTRODUODENOSCOPY N/A 03/01/2024   Procedure: EGD (ESOPHAGOGASTRODUODENOSCOPY);  Surgeon: Leigh Elspeth SQUIBB, MD;  Location: [facility] ENDOSCOPY;  Service: Gastroenterology;  Laterality: N/A;   ESOPHAGOGASTRODUODENOSCOPY (EGD) WITH PROPOFOL  N/A 08/02/2021   Procedure:  ESOPHAGOGASTRODUODENOSCOPY (EGD) WITH PROPOFOL ;  Surgeon: Teressa Toribio SQUIBB, MD;  Location: [facility];  Service: Endoscopy;  Laterality: N/A;   ESOPHAGOGASTRODUODENOSCOPY (EGD) WITH PROPOFOL  N/A 01/31/2022   Procedure: ESOPHAGOGASTRODUODENOSCOPY (EGD) WITH PROPOFOL ;  Surgeon: Teressa Toribio SQUIBB, MD;  Location: [facility];  Service: Gastroenterology;  Laterality: N/A;   ESOPHAGOGASTRODUODENOSCOPY (EGD) WITH PROPOFOL  N/A 05/07/2022   Procedure: ESOPHAGOGASTRODUODENOSCOPY (EGD) WITH PROPOFOL ;  Surgeon: Stacia Glendia BRAVO, MD;  Location: [facility];  Service: Gastroenterology;  Laterality: N/A;   ESOPHAGOGASTRODUODENOSCOPY (EGD) WITH PROPOFOL  N/A 06/13/2022   Procedure: ESOPHAGOGASTRODUODENOSCOPY (EGD) WITH PROPOFOL ;  Surgeon: Wilhelmenia Aloha Raddle., MD;  Location: [facility];  Service: Gastroenterology;  Laterality: N/A;   EUS N/A 01/31/2022   Procedure: UPPER ENDOSCOPIC ULTRASOUND (EUS) RADIAL;  Surgeon: Teressa Toribio SQUIBB, MD;  Location: [facility];  Service: Gastroenterology;  Laterality: N/A;   EUS N/A 06/13/2022   Procedure: UPPER ENDOSCOPIC ULTRASOUND (EUS) LINEAR;  Surgeon: Wilhelmenia Aloha Raddle., MD;  Location: [facility];  Service: Gastroenterology;  Laterality: N/A;   FINE NEEDLE ASPIRATION N/A 08/02/2021   Procedure: FINE NEEDLE ASPIRATION (FNA) LINEAR;  Surgeon: Teressa Toribio SQUIBB, MD;  Location: [facility];  Service: Endoscopy;  Laterality: N/A;   FINE NEEDLE ASPIRATION N/A 01/31/2022   Procedure: FINE NEEDLE ASPIRATION (FNA) LINEAR;  Surgeon: Teressa Toribio SQUIBB, MD;  Location: [facility];  Service: Gastroenterology;  Laterality: N/A;   FINE NEEDLE ASPIRATION N/A 06/13/2022   Procedure: FINE NEEDLE ASPIRATION (FNA) LINEAR;  Surgeon: Wilhelmenia Aloha Raddle., MD;  Location: [facility];  Service: Gastroenterology;  Laterality: N/A;   GIVENS CAPSULE STUDY N/A 05/09/2022   Procedure: GIVENS CAPSULE STUDY;  Surgeon: Stacia Glendia BRAVO, MD;  Location: [facility];  Service:  Gastroenterology;  Laterality: N/A;   HARDWARE REMOVAL Right 02/04/2024   Procedure: REMOVAL, HARDWARE;  Surgeon: Harden Jerona GAILS, MD;  Location[facility];  Service: Orthopedics;  Laterality: Right;  HARDWARE REMOVAL RIGHT ANKLE   HEMOSTASIS CLIP PLACEMENT  05/08/2022   Procedure: HEMOSTASIS CLIP PLACEMENT;  Surgeon: Stacia Glendia BRAVO, MD;  Location: [facility];  Service: Gastroenterology;;   HEMOSTASIS CLIP PLACEMENT  05/11/2022   Procedure: HEMOSTASIS CLIP PLACEMENT;  Surgeon: Stacia Glendia BRAVO, MD;  Location: [facility];  Service: Gastroenterology;;   HEMOSTASIS CLIP PLACEMENT  03/01/2024   Procedure: CONTROL OF HEMORRHAGE, GI TRACT, ENDOSCOPIC, BY CLIPPING OR OVERSEWING;  Surgeon: Leigh Elspeth SQUIBB, MD;  Location: [facility];  Service: Gastroenterology;;   HOT HEMOSTASIS N/A 06/13/2022   Procedure: HOT HEMOSTASIS (ARGON PLASMA COAGULATION/BICAP);  Surgeon: Wilhelmenia Aloha Raddle., MD;  Location: [facility];  Service: Gastroenterology;  Laterality: N/A;   left kidney removed     ORIF ANKLE FRACTURE Right 01/06/2024   Procedure: OPEN REDUCTION INTERNAL FIXATION (ORIF) ANKLE FRACTURE;  Surgeon: Beverley Evalene BIRCH, MD;  Location: [facility];  Service: Orthopedics;  Laterality: Right;   POLYPECTOMY  05/08/2022   Procedure: POLYPECTOMY;  Surgeon: Stacia Glendia BRAVO, MD;  Location: [facility];  Service: Gastroenterology;;   UPPER ESOPHAGEAL ENDOSCOPIC ULTRASOUND (EUS) N/A 08/02/2021   Procedure: UPPER ESOPHAGEAL ENDOSCOPIC ULTRASOUND (EUS);  Surgeon: Teressa Toribio SQUIBB, MD;  Location: [facility];  Service: Endoscopy;  Laterality: N/A;  Radial and Linear     reports that he quit smoking  about 6 years ago. His smoking use included cigarettes. He started smoking about 32 years ago. He has a 2.5 pack-year smoking history. He has been exposed to tobacco smoke. He has never used smokeless tobacco. He reports that he does not currently use alcohol . He reports that he does not currently use drugs.  Family  History  Problem Relation Age of Onset   Diabetes Mellitus II Mother    Colon cancer Neg Hx    Esophageal cancer Neg Hx    Inflammatory bowel disease Neg Hx    Liver disease Neg Hx    Pancreatic cancer Neg Hx    Rectal cancer Neg Hx    Stomach cancer Neg Hx     Prior to Admission medications  Medication Sig Start Date End Date Taking? Authorizing Provider  acetaminophen  (TYLENOL ) 500 MG tablet Take 2,000 mg by mouth 2 (two) times daily as needed for moderate pain (pain score 4-6), fever or headache.    [provider]  Alcohol  Swabs  (ALCOHOL  PADS) 70 % PADS Clean skin before every injection 05/27/24   Newlin, Enobong, MD  atorvastatin  (LIPITOR) 20 MG tablet Take 1 tablet (20 mg total) by mouth daily. Patient not taking: Reported on 06/24/2024 04/02/23   Laurence Locus, DO  Continuous Glucose Sensor (FREESTYLE LIBRE 3 PLUS SENSOR) MISC Change sensor every 15 days. Use to check blood sugar continuously. 05/24/24   Newlin, Enobong, MD  empagliflozin  (JARDIANCE ) 10 MG TABS tablet TAKE 1 TABLET BY MOUTH ONCE DAILY BEFORE BREAKFAST *NEW PRESCRIPTION REQUEST* 05/18/24   Newlin, Enobong, MD  folic acid  (FOLVITE ) 1 MG tablet TAKE 1 TABLET(S) BY MOUTH 1 TIMES PER DAY *NEW PRESCRIPTION REQUEST* Patient not taking: Reported on 06/24/2024 05/18/24   Newlin, Enobong, MD  insulin  glargine (LANTUS  SOLOSTAR) 100 UNIT/ML Solostar Pen INJECT 32 UNITS SUBCUTANEOUSLY ONCE DAILY *NEW PRESCRIPTION REQUEST* 05/18/24   Newlin, Enobong, MD  insulin  lispro (HUMALOG  KWIKPEN) 100 UNIT/ML KwikPen Inject 10 Units into the skin 3 (three) times daily. 05/18/24   Newlin, Enobong, MD  Insulin  Pen Needle (PEN NEEDLES) 32G X 4 MM MISC Use to inject insulin  once daily. 05/27/24   Newlin, Enobong, MD  levETIRAcetam  (KEPPRA ) 1000 MG tablet TAKE 1 TABLET(S) BY MOUTH 2 TIMES PER DAY *NEW PRESCRIPTION REQUEST* 05/18/24   Newlin, Corrina, MD  metFORMIN  (GLUCOPHAGE ) 500 MG tablet TAKE 2 TABLET(S) BY MOUTH 2 TIMES PER DAY *NEW  PRESCRIPTION REQUEST* 05/18/24   Newlin, Enobong, MD  metoprolol  succinate (TOPROL -XL) 25 MG 24 hr tablet TAKE 1/2 (ONE-HALF) TABLET BY MOUTH ONCE DAILY *NEW PRESCRIPTION REQUEST* 05/18/24   Newlin, Enobong, MD  pantoprazole  (PROTONIX ) 40 MG tablet TAKE 1 TABLET(S) BY MOUTH 2 TIMES PER DAY *NEW PRESCRIPTION REQUEST* 05/18/24   Newlin, Enobong, MD  thiamine  (VITAMIN B1) 100 MG tablet Take 1 tablet (100 mg total) by mouth daily. 05/24/24   Delbert Corrina, MD    Physical Exam: Vitals:   07/01/24 0030 07/01/24 0045 07/01/24 0100 07/01/24 0354  BP: 128/82 127/79 128/85 135/89  Pulse:  (!) 102  96  Resp: 16 16  14   Temp:    97.7 F (36.5 C)  TempSrc:      SpO2:  91%  95%  Weight:      Height:       Physical Exam Constitutional:      Appearance: He is normal weight.  HENT:     Head: Normocephalic.     Nose: Nose normal.     Mouth/Throat:     Mouth: Mucous  membranes are moist.     Pharynx: Oropharynx is clear.  Eyes:     Extraocular Movements: Extraocular movements intact.     Conjunctiva/sclera: Conjunctivae normal.     Pupils: Pupils are equal, round, and reactive to light.  Cardiovascular:     Rate and Rhythm: Normal rate and regular rhythm.     Pulses: Normal pulses.     Heart sounds: Normal heart sounds.  Pulmonary:     Effort: Pulmonary effort is normal.     Breath sounds: Normal breath sounds.  Abdominal:     General: Abdomen is flat. Bowel sounds are normal.  Musculoskeletal:        General: Swelling and deformity present. Normal range of motion.     Cervical back: Normal range of motion.  Skin:    General: Skin is warm.     Capillary Refill: Capillary refill takes less than 2 seconds.  Neurological:     General: No focal deficit present.     Mental Status: He is alert and oriented to person, place, and time.  Psychiatric:        Mood and Affect: Mood normal.        Labs on Admission: I have personally reviewed the patients's labs and imaging  studies.  Assessment/Plan Principal Problem:   Osteomyelitis (HCC)   # Chronic osteomyelitis of right ankle - Previously seen by infectious disease on 12/19 recommended daptomycin  and Unasyn .  Orthopedics at the time had recommended BKA however patient was not amenable   Plan: Continue daptomycin  and Unasyn  Patient is currently not amenable to below-knee amputation.  Would recommend further discussion in the morning.  # History of alcohol  usage-continue CIWA protocol  # Type 2 diabetes-continue Lantus , aspart  # History of seizure-continue Keppra   # Hypertension-continue metoprolol   # GERD-continue Protonix    Admission status: Inpatient Med-Surg  Certification: The appropriate patient status for this patient is INPATIENT. Inpatient status is judged to be reasonable and necessary in order to provide the required intensity of service to ensure the patient's safety. The patient's presenting symptoms, physical exam findings, and initial radiographic and laboratory data in the context of their chronic comorbidities is felt to place them at high risk for further clinical deterioration. Furthermore, it is not anticipated that the patient will be medically stable for discharge from the hospital within 2 midnights of admission.   * I certify that at the point of admission it is my clinical judgment that the patient will require inpatient hospital care spanning beyond 2 midnights from the point of admission due to high intensity of service, high risk for further deterioration and high frequency of surveillance required.DEWAINE Lamar Dess MD [facility] If 7PM-7AM, please contact night-coverage [email or web address]  07/01/2024, 4:46 AM        [1]  Allergies Allergen Reactions   Iohexol  Other (See Comments)    Unknown reaction at 54 days old Mom at bedside reported patient was given injections of iohexol  in foot and resulted in hole in foot or foot got infected   Nsaids Other  (See Comments)    Patient said he was told he could take Tylenol  ONLY.   "

## 2024-07-01 NOTE — ED Triage Notes (Signed)
 Pt BIBA c/o not feeling well/hyperglycemia. Hasn't taken diabetic meds in a week. ETOH on board, reports drinking large amounts of liquor daily. Hx of seizures.  PTA  CBG 441

## 2024-07-01 NOTE — ED Notes (Signed)
 Pt's oxygen sats dropped to 74% while sleeping, nurse placed pt on 2L [place], pt sats came up to 100%. Raford MD aware.

## 2024-07-01 NOTE — ED Provider Notes (Addendum)
 " [place] EMERGENCY DEPARTMENT AT [facility] Provider Note   CSN: [id number] Arrival date & time: 06/30/24  2359     Patient presents with: Chief complaint: Hyperglycemia  Gary Mitchell is a 53 y.o. male.   The history is provided by the patient.   He has history of diabetes, alcoholic hepatitis, osteomyelitis of the right ankle and states that he had been hospitalized but left the hospital about 4 days ago and was not able to get in the trailer with his medication so he has not been on any medication since then.  He states that he feels bad but cannot explain what does feel better.  He denies fever or chills and denies pain and denies nausea or vomiting or diarrhea.  He does have swelling of his right ankle which has been present since an ankle fracture 6 months ago.  Of note, he does admit to drinking a bottle of whiskey today.  He denies drug use.    Prior to Admission medications  Medication Sig Start Date End Date Taking? Authorizing Provider  acetaminophen  (TYLENOL ) 500 MG tablet Take 2,000 mg by mouth 2 (two) times daily as needed for moderate pain (pain score 4-6), fever or headache.    [provider]  Alcohol  Swabs  (ALCOHOL  PADS) 70 % PADS Clean skin before every injection 05/27/24   Newlin, Enobong, MD  atorvastatin  (LIPITOR) 20 MG tablet Take 1 tablet (20 mg total) by mouth daily. Patient not taking: Reported on 06/24/2024 04/02/23   Laurence Locus, DO  Continuous Glucose Sensor (FREESTYLE LIBRE 3 PLUS SENSOR) MISC Change sensor every 15 days. Use to check blood sugar continuously. 05/24/24   Newlin, Enobong, MD  empagliflozin  (JARDIANCE ) 10 MG TABS tablet TAKE 1 TABLET BY MOUTH ONCE DAILY BEFORE BREAKFAST *NEW PRESCRIPTION REQUEST* 05/18/24   Delbert Clam, MD  folic acid  (FOLVITE ) 1 MG tablet TAKE 1 TABLET(S) BY MOUTH 1 TIMES PER DAY *NEW PRESCRIPTION REQUEST* Patient not taking: Reported on 06/24/2024 05/18/24   Newlin, Enobong, MD  insulin  glargine  (LANTUS  SOLOSTAR) 100 UNIT/ML Solostar Pen INJECT 32 UNITS SUBCUTANEOUSLY ONCE DAILY *NEW PRESCRIPTION REQUEST* 05/18/24   Newlin, Enobong, MD  insulin  lispro (HUMALOG  KWIKPEN) 100 UNIT/ML KwikPen Inject 10 Units into the skin 3 (three) times daily. 05/18/24   Newlin, Enobong, MD  Insulin  Pen Needle (PEN NEEDLES) 32G X 4 MM MISC Use to inject insulin  once daily. 05/27/24   Newlin, Enobong, MD  levETIRAcetam  (KEPPRA ) 1000 MG tablet TAKE 1 TABLET(S) BY MOUTH 2 TIMES PER DAY *NEW PRESCRIPTION REQUEST* 05/18/24   Newlin, Clam, MD  metFORMIN  (GLUCOPHAGE ) 500 MG tablet TAKE 2 TABLET(S) BY MOUTH 2 TIMES PER DAY *NEW PRESCRIPTION REQUEST* 05/18/24   Newlin, Enobong, MD  metoprolol  succinate (TOPROL -XL) 25 MG 24 hr tablet TAKE 1/2 (ONE-HALF) TABLET BY MOUTH ONCE DAILY *NEW PRESCRIPTION REQUEST* 05/18/24   Newlin, Clam, MD  pantoprazole  (PROTONIX ) 40 MG tablet TAKE 1 TABLET(S) BY MOUTH 2 TIMES PER DAY *NEW PRESCRIPTION REQUEST* 05/18/24   Newlin, Enobong, MD  thiamine  (VITAMIN B1) 100 MG tablet Take 1 tablet (100 mg total) by mouth daily. 05/24/24   Newlin, Enobong, MD    Allergies: Iohexol  and Nsaids    Review of Systems  All other systems reviewed and are negative.   Updated Vital Signs BP 127/79 (BP Location: Left Arm)   Pulse (!) 102   Temp 97.7 F (36.5 C) (Oral)   Resp 14   Ht 5' 4 (1.626 m)   Wt 63.5 kg  SpO2 100%   BMI 24.03 kg/m   Physical Exam Vitals and nursing note reviewed.   53 year old male, resting comfortably and in no acute distress. Vital signs are vital signs are significant for slightly elevated heart rate. Oxygen saturation is 100%, which is normal. Head is normocephalic and atraumatic. PERRLA, EOMI. Oropharynx is clear. Neck is nontender and supple without adenopathy. Lungs are clear without rales, wheezes, or rhonchi. Chest is nontender. Heart has regular rate and rhythm without murmur. Abdomen is soft, flat, nontender. Extremities: There is soft tissue  swelling around the right lower leg and right ankle with an ulcer over the lateral malleolus.  Swelling is maximum over the ankle and over the medial aspect of the ankle. Skin is warm and dry without rash. Neurologic: Awake and alert, moves all extremities equally.      (all labs ordered are listed, but only abnormal results are displayed) Labs Reviewed  COMPREHENSIVE METABOLIC PANEL WITH GFR - Abnormal; Notable for the following components:      Result Value   Glucose, Bld 297 (*)    Creatinine, Ser 0.45 (*)    Calcium  8.8 (*)    Total Protein 9.8 (*)    Albumin 3.3 (*)    AST 143 (*)    ALT 45 (*)    Alkaline Phosphatase 498 (*)    Total Bilirubin 3.2 (*)    All other components within normal limits  CBC WITH DIFFERENTIAL/PLATELET - Abnormal; Notable for the following components:   RBC 3.78 (*)    Hemoglobin 9.0 (*)    HCT 28.2 (*)    MCV 74.6 (*)    MCH 23.8 (*)    RDW 28.2 (*)    Platelets 107 (*)    All other components within normal limits  ETHANOL - Abnormal; Notable for the following components:   Alcohol , Ethyl (B) 357 (*)    All other components within normal limits  MAGNESIUM  - Abnormal; Notable for the following components:   Magnesium  1.5 (*)    All other components within normal limits  CBG MONITORING, ED - Abnormal; Notable for the following components:   Glucose-Capillary 310 (*)    All other components within normal limits  BETA-HYDROXYBUTYRIC ACID  URINALYSIS, ROUTINE W REFLEX MICROSCOPIC  CBG MONITORING, ED  CBG MONITORING, ED    EKG: EKG Interpretation Date/Time:  Thursday July 01 2024 00:09:43 EST Ventricular Rate:  103 PR Interval:  155 QRS Duration:  122 QT Interval:  365 QTC Calculation: 478 R Axis:   68  Text Interpretation: Sinus tachycardia Left ventricular hypertrophy ST elev, probable normal early repol pattern Borderline prolonged QT interval Artifact in lead(s) I II III aVR aVL aVF V1 V2 V5 When compared with ECG of 01/18/2024,  No significant change was found Confirmed by Raford Lenis ([id number]) on 07/01/2024 2:43:29 AM  Radiology: [facility] Ankle 2 Views Right Result Date: 07/01/2024 EXAM: 2 VIEW(S) XRAY OF THE RIGHT ANKLE 07/01/2024 02:24:00 AM CLINICAL HISTORY: osteomyelitis COMPARISON: Comparison with 06/23/2024 and MRI 06/24/2024. FINDINGS: BONES AND JOINTS: Persistent osteomyelitis involving the distal tibia, fibula, and medial malleolus with involvement of the talar dome. Periprosthetic lucencies compatible with osteomyelitic involvement. Similar lateral dislocation of the talus in relation to the tibia with widening of the medial mortise. SOFT TISSUES: Extensive soft tissue swelling about the ankle. IMPRESSION: 1. Persistent osteomyelitis involving the distal tibia, fibula, medial malleolus, and talar dome. 2. Similar lateral dislocation of the talus relative to the tibia with widening of the medial  mortise and extensive soft tissue swelling about the ankle. Electronically signed by: Norman Gatlin MD 07/01/2024 02:29 AM EST RP Workstation: [id number]     Procedures   Medications Ordered in the ED  LORazepam  (ATIVAN ) injection 0-4 mg (2 mg Intravenous Given 07/01/24 0123)    Or  LORazepam  (ATIVAN ) tablet 0-4 mg ( Oral See Alternative 07/01/24 0123)  LORazepam  (ATIVAN ) injection 0-4 mg (has no administration in time range)    Or  LORazepam  (ATIVAN ) tablet 0-4 mg (has no administration in time range)  thiamine  (VITAMIN B1) tablet 100 mg (has no administration in time range)    Or  thiamine  (VITAMIN B1) injection 100 mg (has no administration in time range)  DAPTOmycin  (CUBICIN ) IVPB 500 mg/50mL premix (500 mg Intravenous New Bag/Given 07/01/24 0137)  metoprolol  succinate (TOPROL -XL) 24 hr tablet 12.5 mg (has no administration in time range)  insulin  glargine-yfgn (SEMGLEE ) injection 28 Units (has no administration in time range)  insulin  aspart (novoLOG ) injection 0-24 Units (has no administration in time range)   pantoprazole  (PROTONIX ) EC tablet 40 mg (has no administration in time range)  levETIRAcetam  (KEPPRA ) tablet 1,000 mg (has no administration in time range)  enoxaparin  (LOVENOX ) injection 40 mg (has no administration in time range)  acetaminophen  (TYLENOL ) tablet 650 mg (has no administration in time range)    Or  acetaminophen  (TYLENOL ) suppository 650 mg (has no administration in time range)  ondansetron  (ZOFRAN ) tablet 4 mg (has no administration in time range)    Or  ondansetron  (ZOFRAN ) injection 4 mg (has no administration in time range)  magnesium  sulfate IVPB 2 g 50 mL (2 g Intravenous Not Given 07/01/24 0645)  LORazepam  (ATIVAN ) tablet 1-4 mg (has no administration in time range)    Or  LORazepam  (ATIVAN ) injection 1-4 mg (has no administration in time range)  thiamine  (VITAMIN B1) tablet 100 mg (has no administration in time range)    Or  thiamine  (VITAMIN B1) injection 100 mg (has no administration in time range)  folic acid  (FOLVITE ) tablet 1 mg (has no administration in time range)  multivitamin with minerals tablet 1 tablet (has no administration in time range)  LORazepam  (ATIVAN ) injection 0-4 mg ( Intravenous Not Given 07/01/24 0645)    Followed by  LORazepam  (ATIVAN ) injection 0-4 mg (has no administration in time range)  Ampicillin -Sulbactam (UNASYN ) 3 g in sodium chloride  0.9 % 100 mL IVPB (has no administration in time range)  sodium chloride  0.9 % bolus 1,000 mL (1,000 mLs Intravenous New Bag/Given 07/01/24 0131)  levETIRAcetam  (KEPPRA ) undiluted injection 1,000 mg (1,000 mg Intravenous Given 07/01/24 0120)  piperacillin -tazobactam (ZOSYN ) IVPB 3.375 g (0 g Intravenous Stopped 07/01/24 0155)                                    Medical Decision Making Amount and/or Complexity of Data Reviewed Labs: ordered. Radiology: ordered.  Risk OTC drugs. Prescription drug management. Decision regarding hospitalization.   Medication noncompliance, osteomyelitis of  the right ankle with gap in treatment.  I have reviewed his records and note hospitalization on 06/23/2024 for osteomyelitis of the right ankle and left on 06/25/2024 AGAINST MEDICAL ADVICE.  When I told him that he had not been discharged but he chose to leave, he became very angry and indignant and started telling me how he was not going to let certain people touch him.  I did assure me that I only wanted to make  sure that appropriate care was initiated.  I do not see any documentation of antibiotic prescriptions having been given.  However, he is supposed to be taking levetiracetam  daily.  I have ordered a loading dose of levetiracetam  as well as some IV fluids.  I have ordered screening labs.  I can find no culture results to direct therapy so I have restarted the treatment which was being done in the hospital which was piperacillin -tazobactam and daptomycin .  Of note, patient blames all of the problems with his ankle on surgery that was done and states that if he had just had his ankle wrapped he would never have had any problems with his ankle.  I have compared his ankle ulceration with photo taken on 12/19, and it appears similar.  On 05/06/2024 he also had photos of the ankle at which time he actually had an ulceration on the medial aspect of the ankle which is no longer present.   I have reviewed his electrocardiogram, and my interpretation is sinus tachycardia, left ventricular hypertrophy, early repolarization unchanged from prior.  I have reviewed his laboratory tests, and my interpretation is elevated alkaline phosphatase which has increased, elevated transaminases not significantly changed from prior, elevated bilirubin which has increased slightly, low magnesium , normal WBC, stable anemia, stable thrombocytopenia.  I have ordered intravenous magnesium .  He ankle x-ray shows persistent osteomyelitis and chronic dislocation of the talus.  Have independently viewed the images, and agree with  radiologist's interpretation.  I have discussed case with Dr. Dena of [facility], who agrees to admit the patient.  Final diagnoses:  Other chronic osteomyelitis of right ankle (HCC)  Alcohol  intoxication, uncomplicated  Microcytic anemia  Thrombocytopenia  Hypomagnesemia  Elevated liver function tests    ED Discharge Orders     None          Raford Lenis, MD 07/01/24 [id number]    Raford Lenis, MD 07/01/24 [phone]  "

## 2024-07-01 NOTE — Hospital Course (Addendum)
 Brief Narrative:   53 y.o. male with medical history significant of alcoholic hepatitis, A-fib, cirrhosis, type 2 diabetes, seizure who denting to emergency department due to poorly controlled blood sugar and lack of medications.  Also reported of swelling in the right ankle.  Recent x-ray head showed persistent osteomyelitis of tibia/fibula.  ID had recommended Unasyn /daptomycin  but patient left AMA and presented back to the ER.  During this hospitalization currently on IV antibiotics, repeat MRI ankle has been ordered but delayed due to concerns of  bed bugs. Discussed with orthopedic Dr Jerri, if patient agreeable for BKA, consult Dr. Harden on Monday  Assessment & Plan:   Generalized weakness Osteomyelitis of the right ankle -Recently admitted for sepsis secondary to right lower extremity osteomyelitis seen by orthopedic and infectious disease.  Patient declined BKA and opted for IV antibiotic, ID recommended IV daptomycin  and Unasyn  but patient ended up leaving AGAINST MEDICAL ADVICE.  For now we will continue IV daptomycin  and Unasyn  per ID - MRI of the right ankle is delayed due to live bedbugs.  But based off of previous MRI orthopedic has recommended amputation. - Spoke with Dr. Jerri 12/25.  For now continue IV antibiotics, if patient becomes agreeable for BKA, will consult Dr. Harden who should return on Monday  Hypomagnesemia - As needed repletion  Bedbugs - Nursing staff noted like bedbugs.  Appropriate hospital staff has been notified.  History of alcohol  use Transaminitis, alcohol  pattern -Alcohol  withdrawal protocol  Microcytic anemia - Recent iron  studies showed low saturation and low ferritin levels.  Will give him IV iron  and make outpatient referral  Diabetes mellitus type 2, uncontrolled due to hyper/hypoglycemia -Recent hemoglobin A1c 6.9.  Overnight hypoglycemia therefore will reduce long-acting.  Continue sliding scale  History of seizure -Keppra   Essential  hypertension -Continue home medication.  IV as needed  GERD -PPI  There is concerns of bedbugs.  Appropriate staff has been notified  DVT prophylaxis: Lovenox     Code Status: Full Code Family Communication:   Ongoing management for right ankle osteomyelitis   PT Follow up Recs:   Subjective:   Examination:  General exam: Appears calm and comfortable  Respiratory system: Clear to auscultation. Respiratory effort normal. Cardiovascular system: S1 & S2 heard, RRR. No JVD, murmurs, rubs, gallops or clicks. No pedal edema. Gastrointestinal system: Abdomen is nondistended, soft and nontender. No organomegaly or masses felt. Normal bowel sounds heard. Central nervous system: Alert and oriented. No focal neurological deficits. Extremities: Symmetric 5 x 5 power. Skin: Right ankle swelling Psychiatry: Judgement and insight appear normal. Mood & affect appropriate.

## 2024-07-01 NOTE — Consult Note (Signed)
 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  Noted that he was not feeling well - He was seen by surgery at the last recommended BKA.  As previously noted antibiotics are futile given hardware, chronic infection as well as new abscess.  Recommendations:  -blood cx  - daptomycin  and unsyn.  Agree with BKA as previously noted -MRI Right ankle as ankle swelling looks worse. Communicated with radiology, pt is not alert they need further imaging( xrays of DG skull 1-2V, DG ABD 1V, and CXR ). I think as long as pt stable, can assess tomorrow for mental status and if unchanged imaging above prior to mri -Surgery evaluation -Plan communicated with primary -Standard precautions  Microbiology:   Antibiotics: Daptomycin  and Unasyn   Cultures: Blood  Urine  Other   HPI: NASIF BOS is a 53 y.o. 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  MRI has shown On 12/18 septic arthritis and osteomyelitis of distal tibia fillet childless, septated fluid collection medial to screws compatible infection, possible sinus tach stented tibiotalar joint.  He returns back for as he was not feeling well.  Vital stable on admission.  ID engaged.   Review of Systems: Review of  Systems  All other systems reviewed and are negative.   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   Seizures (HCC)    Seizures (HCC)    Thrombocytopenia     Social History[1]  Family History  Problem Relation Age of Onset   Diabetes Mellitus II Mother    Colon cancer Neg Hx    Esophageal cancer Neg Hx    Inflammatory bowel disease Neg Hx    Liver disease Neg Hx    Pancreatic cancer Neg Hx    Rectal cancer Neg Hx    Stomach cancer Neg Hx    Scheduled Meds:  enoxaparin  (LOVENOX ) injection  40 mg Subcutaneous Q24H   [START ON 07/02/2024] ferrous sulfate   325 mg Oral Q breakfast   folic acid   1 mg Oral Daily   insulin  aspart  0-24 Units Subcutaneous TID WC   insulin   glargine-yfgn  28 Units Subcutaneous Daily   levETIRAcetam   1,000 mg Oral BID   LORazepam   0-4 mg Intravenous Q6H   Or   LORazepam   0-4 mg Oral Q6H   [START ON 07/03/2024] LORazepam   0-4 mg Intravenous Q12H   Or   [START ON 07/03/2024] LORazepam   0-4 mg Oral Q12H   LORazepam   0-4 mg Intravenous Q6H   Followed by   NOREEN ON 07/03/2024] LORazepam   0-4 mg Intravenous Q12H   metoprolol  succinate  12.5 mg Oral Daily   multivitamin with minerals  1 tablet Oral Daily   pantoprazole   40 mg Oral BID   senna-docusate  2 tablet Oral QHS   thiamine   100 mg Oral Daily   Or   thiamine   100 mg Intravenous Daily   thiamine   100 mg  Oral Daily   Or   thiamine   100 mg Intravenous Daily   Continuous Infusions:  ampicillin -sulbactam (UNASYN ) IV 3 g (07/01/24 0825)   DAPTOmycin  500 mg (07/01/24 0137)   iron  sucrose     magnesium  sulfate bolus IVPB     PRN Meds:.acetaminophen  **OR** acetaminophen , glucagon  (human recombinant), guaiFENesin , hydrALAZINE , ipratropium-albuterol , LORazepam  **OR** LORazepam , metoprolol  tartrate, ondansetron  **OR** ondansetron  (ZOFRAN ) IV, senna-docusate, traZODone  Allergies[2]  OBJECTIVE: Blood pressure (!) 176/81, pulse 97, temperature 98.2 F (36.8 C), temperature source Oral, resp. rate 16, height 5' 4 (1.626 m), weight 63.5 kg, SpO2 100%.  Physical Exam Constitutional:      General: He is not in acute distress.    Appearance: He is normal weight. He is not toxic-appearing.  HENT:     Head: Normocephalic and atraumatic.     Right Ear: External ear normal.     Left Ear: External ear normal.     Nose: No congestion or rhinorrhea.     Mouth/Throat:     Mouth: Mucous membranes are moist.     Pharynx: Oropharynx is clear.  Eyes:     Extraocular Movements: Extraocular movements intact.     Conjunctiva/sclera: Conjunctivae normal.     Pupils: Pupils are equal, round, and reactive to light.  Cardiovascular:     Rate and Rhythm: Normal rate and regular rhythm.     Heart sounds: No murmur heard.    No friction rub. No gallop.  Pulmonary:     Effort: Pulmonary effort is normal.     Breath sounds: Normal breath sounds.  Abdominal:     General: Abdomen is flat. Bowel sounds are normal.     Palpations: Abdomen is soft.  Musculoskeletal:        General: Swelling present.     Cervical back: Normal range of motion and neck supple.  Skin:    General: Skin is warm and dry.  Neurological:     General: No focal deficit present.     Mental Status: He is oriented to person, place, and time.  Psychiatric:        Mood and Affect: Mood normal.     Lab Results Lab Results  Component  Value Date   WBC 8.1 07/01/2024   HGB 9.0 (L) 07/01/2024   HCT 28.2 (L) 07/01/2024   MCV 74.6 (L) 07/01/2024   PLT 107 (L) 07/01/2024    Lab Results  Component Value Date   CREATININE 0.45 (L) 07/01/2024   BUN 8 07/01/2024   NA 138 07/01/2024   K 4.6 07/01/2024   CL 101 07/01/2024   CO2 22 07/01/2024    Lab Results  Component Value Date  ALT 45 (H) 07/01/2024   AST 143 (H) 07/01/2024   ALKPHOS 498 (H) 07/01/2024   BILITOT 3.2 (H) 07/01/2024       Loney Stank, MD [facility] 07/01/2024, 8:32 AM Evaluation of this patient requires complex antimicrobial therapy evaluation and counseling + isolation needs for disease transmission risk assessment and mitigation       [1]  Social History Tobacco Use   Smoking status: Former    Current packs/day: 0.00    Average packs/day: 0.1 packs/day for 25.0 years (2.5 ttl pk-yrs)    Types: Cigarettes    Start date: 35    Quit date: 2019    Years since quitting: 6.9    Passive exposure: Past   Smokeless tobacco: Never   Tobacco comments:    Former smoker 08/08/22  Vaping Use   Vaping status: Never Used  Substance Use Topics   Alcohol  use: Not Currently   Drug use: Not Currently  [2]  Allergies Allergen Reactions   Iohexol  Other (See Comments)    Unknown reaction at 25 days old Mom at bedside reported patient was given injections of iohexol  in foot and resulted in hole in foot or foot got infected   Nsaids Other (See Comments)    Patient said he was told he could take Tylenol  ONLY.   "

## 2024-07-01 NOTE — ED Provider Notes (Incomplete)
 " [facility] AT [facility] Provider Note   CSN: [id number] Arrival date & time: 06/30/24  2359     Patient presents with: No chief complaint on file.   Gary Mitchell is a 53 y.o. male.  {Add pertinent medical, surgical, social history, OB history to YEP:67052} The history is provided by the patient.   He has history of diabetes, alcoholic hepatitis,    Prior to Admission medications  Medication Sig Start Date End Date Taking? Authorizing Provider  acetaminophen  (TYLENOL ) 500 MG tablet Take 2,000 mg by mouth 2 (two) times daily as needed for moderate pain (pain score 4-6), fever or headache.    [provider]  Alcohol  Swabs  (ALCOHOL  PADS) 70 % PADS Clean skin before every injection 05/27/24   Newlin, Corrina, MD  atorvastatin  (LIPITOR) 20 MG tablet Take 1 tablet (20 mg total) by mouth daily. Patient not taking: Reported on 06/24/2024 04/02/23   Laurence Locus, DO  Continuous Glucose Sensor (FREESTYLE LIBRE 3 PLUS SENSOR) MISC Change sensor every 15 days. Use to check blood sugar continuously. 05/24/24   Delbert Corrina, MD  empagliflozin  (JARDIANCE ) 10 MG TABS tablet TAKE 1 TABLET BY MOUTH ONCE DAILY BEFORE BREAKFAST *NEW PRESCRIPTION REQUEST* 05/18/24   Delbert Corrina, MD  folic acid  (FOLVITE ) 1 MG tablet TAKE 1 TABLET(S) BY MOUTH 1 TIMES PER DAY *NEW PRESCRIPTION REQUEST* Patient not taking: Reported on 06/24/2024 05/18/24   Newlin, Enobong, MD  insulin  glargine (LANTUS  SOLOSTAR) 100 UNIT/ML Solostar Pen INJECT 32 UNITS SUBCUTANEOUSLY ONCE DAILY *NEW PRESCRIPTION REQUEST* 05/18/24   Newlin, Enobong, MD  insulin  lispro (HUMALOG  KWIKPEN) 100 UNIT/ML KwikPen Inject 10 Units into the skin 3 (three) times daily. 05/18/24   Newlin, Enobong, MD  Insulin  Pen Needle (PEN NEEDLES) 32G X 4 MM MISC Use to inject insulin  once daily. 05/27/24   Newlin, Enobong, MD  levETIRAcetam  (KEPPRA ) 1000 MG tablet TAKE 1 TABLET(S) BY MOUTH 2 TIMES PER DAY *NEW  PRESCRIPTION REQUEST* 05/18/24   Newlin, Enobong, MD  metFORMIN  (GLUCOPHAGE ) 500 MG tablet TAKE 2 TABLET(S) BY MOUTH 2 TIMES PER DAY *NEW PRESCRIPTION REQUEST* 05/18/24   Newlin, Enobong, MD  metoprolol  succinate (TOPROL -XL) 25 MG 24 hr tablet TAKE 1/2 (ONE-HALF) TABLET BY MOUTH ONCE DAILY *NEW PRESCRIPTION REQUEST* 05/18/24   Newlin, Corrina, MD  pantoprazole  (PROTONIX ) 40 MG tablet TAKE 1 TABLET(S) BY MOUTH 2 TIMES PER DAY *NEW PRESCRIPTION REQUEST* 05/18/24   Newlin, Enobong, MD  thiamine  (VITAMIN B1) 100 MG tablet Take 1 tablet (100 mg total) by mouth daily. 05/24/24   Newlin, Enobong, MD    Allergies: Iohexol  and Nsaids    Review of Systems  All other systems reviewed and are negative.   Updated Vital Signs There were no vitals taken for this visit.  Physical Exam Vitals and nursing note reviewed.   53 year old male, resting comfortably and in no acute distress. Vital signs are ***. Oxygen saturation is ***%, which is normal. Head is normocephalic and atraumatic. PERRLA, EOMI. Oropharynx is clear. Neck is nontender and supple without adenopathy or JVD. Back is nontender and there is no CVA tenderness. Lungs are clear without rales, wheezes, or rhonchi. Chest is nontender. Heart has regular rate and rhythm without murmur. Abdomen is soft, flat, nontender without masses or hepatosplenomegaly and peristalsis is normoactive. Extremities have no cyanosis or edema, full range of motion is present. Skin is warm and dry without rash. Neurologic: Mental status is normal, cranial nerves are intact, there are no motor or  sensory deficits.  (all labs ordered are listed, but only abnormal results are displayed) Labs Reviewed - No data to display  EKG: None  Radiology: No results found.  {Document cardiac monitor, telemetry assessment procedure when appropriate:32947} Procedures   Medications Ordered in the ED - No data to display    {Click here for ABCD2, HEART and other  calculators REFRESH Note before signing:1}                              Medical Decision Making  ***  {Document critical care time when appropriate  Document review of labs and clinical decision tools ie CHADS2VASC2, etc  Document your independent review of radiology images and any outside records  Document your discussion with family members, caretakers and with consultants  Document social determinants of health affecting pt's care  Document your decision making why or why not admission, treatments were needed:32947:::1}   Final diagnoses:  None    ED Discharge Orders     None        "

## 2024-07-01 NOTE — Progress Notes (Signed)
 Patient has arrived on unit, pt is very somnolent and refuses to allow this nurse to obtain vitals. Unable to obtain any information from patient at this time.

## 2024-07-01 NOTE — Progress Notes (Signed)
 " PROGRESS NOTE    Gary Mitchell  FMW:[id number] DOB: 13-Nov-1970 DOA: 07/01/2024 PCP: Delbert Clam, MD    Brief Narrative:   53 y.o. male with medical history significant of alcoholic hepatitis, A-fib, cirrhosis, type 2 diabetes, seizure who denting to emergency department due to poorly controlled blood sugar and lack of medications.  Also reported of swelling in the right ankle.  Recent x-ray head showed persistent osteomyelitis of tibia/fibula.  ID had recommended Unasyn /daptomycin  but patient left AMA and presented back to the ER.  Assessment & Plan:   Generalized weakness Osteomyelitis of the right ankle -Recently admitted for sepsis secondary to right lower extremity osteomyelitis seen by orthopedic and infectious disease.  Patient declined BKA and opted for IV antibiotic, ID recommended IV daptomycin  and Unasyn  but patient ended up leaving AGAINST MEDICAL ADVICE.  For now we will continue IV daptomycin  and Unasyn  per ID - Will repeat MRI - Spoke with Dr. Jerri.  For now continue IV antibiotics, if patient becomes agreeable for BKA, will consult Dr. Harden who should return on Monday  Hypomagnesemia - As needed repletion  History of alcohol  use Transaminitis, alcohol  pattern -Alcohol  withdrawal protocol  Microcytic anemia - Recent iron  studies showed low saturation and low ferritin levels.  Will give him IV iron  and make outpatient referral  Diabetes mellitus type 2, uncontrolled due to hyperglycemia -Recent hemoglobin A1c 6.9.  Will continue long-acting along with sliding scale and Accu-Cheks  History of seizure -Keppra   Essential hypertension -Continue home medication.  IV as needed  GERD -PPI  DVT prophylaxis: Lovenox     Code Status: Full Code Family Communication:   Ongoing management for right ankle osteomyelitis   PT Follow up Recs:   Subjective:    Examination:  General exam: Appears calm and comfortable  Respiratory system: Clear to auscultation.  Respiratory effort normal. Cardiovascular system: S1 & S2 heard, RRR. No JVD, murmurs, rubs, gallops or clicks. No pedal edema. Gastrointestinal system: Abdomen is nondistended, soft and nontender. No organomegaly or masses felt. Normal bowel sounds heard. Central nervous system: Alert and oriented. No focal neurological deficits. Extremities: Symmetric 5 x 5 power. Skin: No rashes, lesions or ulcers Psychiatry: Judgement and insight appear normal. Mood & affect appropriate.                Diet Orders (From admission, onward)     Start     Ordered   07/01/24 0445  Diet NPO time specified  Diet effective now        07/01/24 0444            Objective: Vitals:   07/01/24 0045 07/01/24 0100 07/01/24 0354 07/01/24 0618  BP: 127/79 128/85 135/89 (!) 176/81  Pulse: (!) 102  96 97  Resp: 16  14 16   Temp:   97.7 F (36.5 C) 98.2 F (36.8 C)  TempSrc:    Oral  SpO2: 91%  95% 100%  Weight:      Height:       No intake or output data in the 24 hours ending 07/01/24 1029 Filed Weights   07/01/24 0010  Weight: 63.5 kg    Scheduled Meds:  enoxaparin  (LOVENOX ) injection  40 mg Subcutaneous Q24H   [START ON 07/02/2024] ferrous sulfate   325 mg Oral Q breakfast   folic acid   1 mg Oral Daily   insulin  aspart  0-24 Units Subcutaneous TID WC   insulin  glargine-yfgn  28 Units Subcutaneous Daily   levETIRAcetam   1,000 mg Oral  BID   LORazepam   0-4 mg Intravenous Q6H   Or   LORazepam   0-4 mg Oral Q6H   [START ON 07/03/2024] LORazepam   0-4 mg Intravenous Q12H   Or   [START ON 07/03/2024] LORazepam   0-4 mg Oral Q12H   LORazepam   0-4 mg Intravenous Q6H   Followed by   NOREEN ON 07/03/2024] LORazepam   0-4 mg Intravenous Q12H   metoprolol  succinate  12.5 mg Oral Daily   multivitamin with minerals  1 tablet Oral Daily   pantoprazole   40 mg Oral BID   senna-docusate  2 tablet Oral QHS   thiamine   100 mg Oral Daily   Or   thiamine   100 mg Intravenous Daily   thiamine   100 mg  Oral Daily   Or   thiamine   100 mg Intravenous Daily   Continuous Infusions:  ampicillin -sulbactam (UNASYN ) IV 3 g (07/01/24 0825)   DAPTOmycin  500 mg (07/01/24 0137)   iron  sucrose     magnesium  sulfate bolus IVPB 2 g (07/01/24 1015)    Nutritional status     Body mass index is 24.03 kg/m.  Data Reviewed:   CBC: Recent Labs  Lab 06/25/24 0616 07/01/24 0104  WBC 6.3 8.1  NEUTROABS 3.9 4.6  HGB 8.6* 9.0*  HCT 26.7* 28.2*  MCV 75.4* 74.6*  PLT 71* 107*   Basic Metabolic Panel: Recent Labs  Lab 06/25/24 0616 07/01/24 0104  NA 130* 138  K 3.2* 4.6  CL 91* 101  CO2 26 22  GLUCOSE 127* 297*  BUN <5* 8  CREATININE 0.54* 0.45*  CALCIUM  8.2* 8.8*  MG 1.3* 1.5*  PHOS 1.5*  --    GFR: Estimated Creatinine Clearance: 91.5 mL/min (A) (by C-G formula based on SCr of 0.45 mg/dL (L)). Liver Function Tests: Recent Labs  Lab 06/25/24 0616 07/01/24 0104  AST  --  143*  ALT  --  45*  ALKPHOS  --  498*  BILITOT  --  3.2*  PROT  --  9.8*  ALBUMIN 3.2* 3.3*   No results for input(s): LIPASE, AMYLASE in the last 168 hours. No results for input(s): AMMONIA in the last 168 hours. Coagulation Profile: No results for input(s): INR, PROTIME in the last 168 hours. Cardiac Enzymes: Recent Labs  Lab 06/25/24 0616  CKTOTAL 336   BNP (last 3 results) No results for input(s): PROBNP in the last 8760 hours. HbA1C: No results for input(s): HGBA1C in the last 72 hours. CBG: Recent Labs  Lab 06/25/24 1201 06/25/24 1617 06/25/24 2135 07/01/24 0015 07/01/24 0809  GLUCAP 125* 156* 174* 310* 195*   Lipid Profile: No results for input(s): CHOL, HDL, LDLCALC, TRIG, CHOLHDL, LDLDIRECT in the last 72 hours. Thyroid  Function Tests: No results for input(s): TSH, T4TOTAL, FREET4, T3FREE, THYROIDAB in the last 72 hours. Anemia Panel: No results for input(s): VITAMINB12, FOLATE, FERRITIN, TIBC, IRON , RETICCTPCT in the last 72  hours. Sepsis Labs: Recent Labs  Lab 06/25/24 9383  LATICACIDVEN 1.5    Recent Results (from the past 240 hours)  Culture, blood (single)     Status: None   Collection Time: 06/23/24 10:57 PM   Specimen: BLOOD LEFT ARM  Result Value Ref Range Status   Specimen Description BLOOD LEFT ARM  Final   Special Requests   Final    BOTTLES DRAWN AEROBIC AND ANAEROBIC Blood Culture adequate volume   Culture   Final    NO GROWTH 5 DAYS Performed at [facility], [address],  [place], [place] [address]    Report Status 06/29/2024 FINAL  Final  Culture, blood (Routine X 2) w Reflex to ID Panel     Status: None   Collection Time: 06/24/24  8:07 AM   Specimen: BLOOD RIGHT ARM  Result Value Ref Range Status   Specimen Description BLOOD RIGHT ARM  Final   Special Requests   Final    BOTTLES DRAWN AEROBIC AND ANAEROBIC Blood Culture results may not be optimal due to an inadequate volume of blood received in culture bottles   Culture   Final    NO GROWTH 5 DAYS Performed at [facility], [address], [place], [place] [address]    Report Status 06/29/2024 FINAL  Final  Culture, blood (Routine X 2) w Reflex to ID Panel     Status: None   Collection Time: 06/24/24  8:12 AM   Specimen: BLOOD RIGHT HAND  Result Value Ref Range Status   Specimen Description BLOOD RIGHT HAND  Final   Special Requests   Final    BOTTLES DRAWN AEROBIC AND ANAEROBIC Blood Culture results may not be optimal due to an inadequate volume of blood received in culture bottles   Culture   Final    NO GROWTH 5 DAYS Performed at [facility], [address], [place], [place] [address]    Report Status 06/29/2024 FINAL  Final  MRSA Next Gen by PCR, Nasal     Status: None   Collection Time: 06/25/24 10:54 AM   Specimen: Nasal Mucosa; Nasal Swab  Result Value Ref Range Status   MRSA by PCR Next Gen NOT DETECTED NOT DETECTED Final    Comment: (NOTE) The GeneXpert MRSA Assay (FDA approved for NASAL  specimens only), is one component of a comprehensive MRSA colonization surveillance program. It is not intended to diagnose MRSA infection nor to guide or monitor treatment for MRSA infections. Test performance is not FDA approved in patients less than 25 years old. Performed at [facility], [address] [place], [place] [address]          Radiology Studies: DG Ankle 2 Views Right Result Date: 07/01/2024 EXAM: 2 VIEW(S) XRAY OF THE RIGHT ANKLE 07/01/2024 02:24:00 AM CLINICAL HISTORY: osteomyelitis COMPARISON: Comparison with 06/23/2024 and MRI 06/24/2024. FINDINGS: BONES AND JOINTS: Persistent osteomyelitis involving the distal tibia, fibula, and medial malleolus with involvement of the talar dome. Periprosthetic lucencies compatible with osteomyelitic involvement. Similar lateral dislocation of the talus in relation to the tibia with widening of the medial mortise. SOFT TISSUES: Extensive soft tissue swelling about the ankle. IMPRESSION: 1. Persistent osteomyelitis involving the distal tibia, fibula, medial malleolus, and talar dome. 2. Similar lateral dislocation of the talus relative to the tibia with widening of the medial mortise and extensive soft tissue swelling about the ankle. Electronically signed by: Norman Gatlin MD 07/01/2024 02:29 AM EST RP Workstation: [id number]           LOS: 0 days   Time spent= 35 mins    Burgess JAYSON Dare, MD [facility]  If 7PM-7AM, please contact night-coverage  07/01/2024, 10:29 AM  "

## 2024-07-02 ENCOUNTER — Inpatient Hospital Stay (HOSPITAL_COMMUNITY)

## 2024-07-02 DIAGNOSIS — L02415 Cutaneous abscess of right lower limb: Secondary | ICD-10-CM | POA: Diagnosis not present

## 2024-07-02 DIAGNOSIS — M8600 Acute hematogenous osteomyelitis, unspecified site: Secondary | ICD-10-CM | POA: Diagnosis not present

## 2024-07-02 DIAGNOSIS — M86671 Other chronic osteomyelitis, right ankle and foot: Secondary | ICD-10-CM | POA: Diagnosis not present

## 2024-07-02 LAB — GLUCOSE, CAPILLARY
Glucose-Capillary: 78 mg/dL (ref 70–99)
Glucose-Capillary: 118 mg/dL — ABNORMAL HIGH (ref 70–99)
Glucose-Capillary: 152 mg/dL — ABNORMAL HIGH (ref 70–99)
Glucose-Capillary: 158 mg/dL — ABNORMAL HIGH (ref 70–99)
Glucose-Capillary: 187 mg/dL — ABNORMAL HIGH (ref 70–99)
Glucose-Capillary: 185 mg/dL — ABNORMAL HIGH (ref 70–99)

## 2024-07-02 LAB — BASIC METABOLIC PANEL WITH GFR
Anion gap: 13 (ref 5–15)
BUN: 7 mg/dL (ref 6–20)
CO2: 26 mmol/L (ref 22–32)
Calcium: 8.1 mg/dL — ABNORMAL LOW (ref 8.9–10.3)
Chloride: 98 mmol/L (ref 98–111)
Creatinine, Ser: 0.44 mg/dL — ABNORMAL LOW (ref 0.61–1.24)
GFR, Estimated: >60 mL/min
Glucose, Bld: 43 mg/dL — CL (ref 70–99)
Potassium: 3.1 mmol/L — ABNORMAL LOW (ref 3.5–5.1)
Sodium: 136 mmol/L (ref 135–145)

## 2024-07-02 LAB — PHOSPHORUS: Phosphorus: 3.2 mg/dL (ref 2.5–4.6)

## 2024-07-02 LAB — CBC
HCT: 22.3 % — ABNORMAL LOW (ref 39.0–52.0)
Hemoglobin: 7.3 g/dL — ABNORMAL LOW (ref 13.0–17.0)
MCH: 23.8 pg — ABNORMAL LOW (ref 26.0–34.0)
MCHC: 32.7 g/dL (ref 30.0–36.0)
MCV: 72.6 fL — ABNORMAL LOW (ref 80.0–100.0)
Platelets: 93 K/uL — ABNORMAL LOW (ref 150–400)
RBC: 3.07 MIL/uL — ABNORMAL LOW (ref 4.22–5.81)
RDW: 27.6 % — ABNORMAL HIGH (ref 11.5–15.5)
WBC: 8.3 K/uL (ref 4.0–10.5)
nRBC: 0.2 % (ref 0.0–0.2)

## 2024-07-02 LAB — MAGNESIUM: Magnesium: 1.5 mg/dL — ABNORMAL LOW (ref 1.7–2.4)

## 2024-07-02 MED ORDER — HYDROCODONE-ACETAMINOPHEN 5-325 MG PO TABS
1.0000 | ORAL_TABLET | Freq: Four times a day (QID) | ORAL | Status: AC | PRN
  Administered 2024-07-02 – 2024-07-04 (×6): 1 via ORAL
  Filled 2024-07-02 (×6): qty 1

## 2024-07-02 MED ORDER — POTASSIUM CHLORIDE 10 MEQ/100ML IV SOLN
10.0000 meq | INTRAVENOUS | Status: AC
  Administered 2024-07-02 (×5): 10 meq via INTRAVENOUS
  Filled 2024-07-02 (×6): qty 100

## 2024-07-02 MED ORDER — POTASSIUM CHLORIDE 10 MEQ/100ML IV SOLN
10.0000 meq | Freq: Once | INTRAVENOUS | Status: AC
  Administered 2024-07-02: 10 meq via INTRAVENOUS

## 2024-07-02 MED ORDER — MAGNESIUM SULFATE 4 GM/100ML IV SOLN
4.0000 g | Freq: Once | INTRAVENOUS | Status: AC
  Administered 2024-07-02: 4 g via INTRAVENOUS
  Filled 2024-07-02: qty 100

## 2024-07-02 MED ORDER — INSULIN GLARGINE-YFGN 100 UNIT/ML ~~LOC~~ SOLN
10.0000 [IU] | Freq: Every day | SUBCUTANEOUS | Status: DC
  Administered 2024-07-02 – 2024-07-06 (×5): 10 [IU] via SUBCUTANEOUS
  Filled 2024-07-02 (×3): qty 0.1

## 2024-07-02 MED ORDER — GADOBUTROL 1 MMOL/ML IV SOLN
6.0000 mL | Freq: Once | INTRAVENOUS | Status: AC | PRN
  Administered 2024-07-02: 6 mL via INTRAVENOUS

## 2024-07-02 NOTE — Plan of Care (Signed)
" °  Problem: Health Behavior/Discharge Planning: Goal: Ability to manage health-related needs will improve Outcome: Progressing   Problem: Clinical Measurements: Goal: Diagnostic test results will improve Outcome: Progressing Goal: Respiratory complications will improve Outcome: Progressing Goal: Cardiovascular complication will be avoided Outcome: Progressing   Problem: Elimination: Goal: Will not experience complications related to urinary retention Outcome: Progressing   Problem: Pain Managment: Goal: General experience of comfort will improve and/or be controlled Outcome: Progressing   Problem: Safety: Goal: Ability to remain free from injury will improve Outcome: Progressing   Problem: Skin Integrity: Goal: Risk for impaired skin integrity will decrease Outcome: Progressing   "

## 2024-07-02 NOTE — Progress Notes (Signed)
 Received notification from MARLA Moats in the lab at 0455 of critical lab value--Blood glucose 43.  Began hypoglycemia protocol, gave patient 8 oz of orange juice and rechecked BG at 0515.  BG 78.  Lynwood Kipper, NP, notified at [phone] advised to offer long acting snack.  Will continue to monitor patient.

## 2024-07-02 NOTE — Progress Notes (Signed)
 Pt unable to go down to MRI because a live bed bug was seen in pts bed. Provider notified. RN changed pts sheets and gave him a bath and changed gown. EVS notified and stated that pest control will not be called until pt is discharged. Provider notified. MRI said that pt can't go if there is a live bed bug because it will contaminate area.

## 2024-07-02 NOTE — Progress Notes (Signed)
 " PROGRESS NOTE    Gary Mitchell  FMW:[id number] DOB: 09-18-1970 DOA: 07/01/2024 PCP: Delbert Clam, MD    Brief Narrative:   53 y.o. male with medical history significant of alcoholic hepatitis, A-fib, cirrhosis, type 2 diabetes, seizure who denting to emergency department due to poorly controlled blood sugar and lack of medications.  Also reported of swelling in the right ankle.  Recent x-ray head showed persistent osteomyelitis of tibia/fibula.  ID had recommended Unasyn /daptomycin  but patient left AMA and presented back to the ER.  Assessment & Plan:   Generalized weakness Osteomyelitis of the right ankle -Recently admitted for sepsis secondary to right lower extremity osteomyelitis seen by orthopedic and infectious disease.  Patient declined BKA and opted for IV antibiotic, ID recommended IV daptomycin  and Unasyn  but patient ended up leaving AGAINST MEDICAL ADVICE.  For now we will continue IV daptomycin  and Unasyn  per ID - MRI of the right ankle pending - Spoke with Dr. Jerri 12/25.  For now continue IV antibiotics, if patient becomes agreeable for BKA, will consult Dr. Harden who should return on Monday  Hypomagnesemia - As needed repletion  History of alcohol  use Transaminitis, alcohol  pattern -Alcohol  withdrawal protocol  Microcytic anemia - Recent iron  studies showed low saturation and low ferritin levels.  Will give him IV iron  and make outpatient referral  Diabetes mellitus type 2, uncontrolled due to hyper/hypoglycemia -Recent hemoglobin A1c 6.9.  Overnight hypoglycemia therefore will reduce long-acting.  Continue sliding scale  History of seizure -Keppra   Essential hypertension -Continue home medication.  IV as needed  GERD -PPI  DVT prophylaxis: Lovenox     Code Status: Full Code Family Communication:   Ongoing management for right ankle osteomyelitis   PT Follow up Recs:   Subjective:  Doing okay no complaints, awaiting his  MRI  Examination:  General exam: Appears calm and comfortable  Respiratory system: Clear to auscultation. Respiratory effort normal. Cardiovascular system: S1 & S2 heard, RRR. No JVD, murmurs, rubs, gallops or clicks. No pedal edema. Gastrointestinal system: Abdomen is nondistended, soft and nontender. No organomegaly or masses felt. Normal bowel sounds heard. Central nervous system: Alert and oriented. No focal neurological deficits. Extremities: Symmetric 5 x 5 power. Skin: No rashes, lesions or ulcers Psychiatry: Judgement and insight appear normal. Mood & affect appropriate.                Diet Orders (From admission, onward)     Start     Ordered   07/01/24 1723  Diet Carb Modified Room service appropriate? Yes  Diet effective now       Question Answer Comment  Diet-HS Snack? Nothing   Calorie Level Medium 1600-2000   Fluid consistency: Thin   Room service appropriate? Yes      07/01/24 1723            Objective: Vitals:   07/01/24 1237 07/01/24 2114 07/02/24 0518 07/02/24 0859  BP: (!) 136/91 (!) 146/87 (!) 146/86 (!) 147/82  Pulse: (!) 104 (!) 104 92 100  Resp: 16 18 18    Temp: 98.4 F (36.9 C) 100 F (37.8 C) 97.8 F (36.6 C)   TempSrc: Oral Oral Oral   SpO2: 100% 99% 100%   Weight:      Height:        Intake/Output Summary (Last 24 hours) at 07/02/2024 1101 Last data filed at 07/02/2024 0754 Gross per 24 hour  Intake 1148.05 ml  Output 2100 ml  Net -951.95 ml   [employer]  07/01/24 0010  Weight: 63.5 kg    Scheduled Meds:  ferrous sulfate   325 mg Oral Q breakfast   folic acid   1 mg Oral Daily   insulin  aspart  0-24 Units Subcutaneous TID WC   insulin  glargine-yfgn  10 Units Subcutaneous Daily   levETIRAcetam   1,000 mg Oral BID   LORazepam   0-4 mg Intravenous Q6H   Or   LORazepam   0-4 mg Oral Q6H   [START ON 07/03/2024] LORazepam   0-4 mg Intravenous Q12H   Or   [START ON 07/03/2024] LORazepam   0-4 mg Oral Q12H   LORazepam    0-4 mg Intravenous Q6H   Followed by   NOREEN ON 07/03/2024] LORazepam   0-4 mg Intravenous Q12H   metoprolol  succinate  12.5 mg Oral Daily   multivitamin with minerals  1 tablet Oral Daily   pantoprazole   40 mg Oral BID   senna-docusate  2 tablet Oral QHS   thiamine   100 mg Oral Daily   Or   thiamine   100 mg Intravenous Daily   Continuous Infusions:  ampicillin -sulbactam (UNASYN ) IV 3 g (07/02/24 0819)   DAPTOmycin  100 mL/hr at 07/01/24 1833   dextrose  5 % and 0.45 % NaCl 75 mL/hr at 07/02/24 0856   magnesium  sulfate bolus IVPB     potassium chloride  10 mEq (07/02/24 1014)    Nutritional status     Body mass index is 24.03 kg/m.  Data Reviewed:   CBC: Recent Labs  Lab 07/01/24 0104 07/02/24 0324  WBC 8.1 8.3  NEUTROABS 4.6  --   HGB 9.0* 7.3*  HCT 28.2* 22.3*  MCV 74.6* 72.6*  PLT 107* 93*   Basic Metabolic Panel: Recent Labs  Lab 07/01/24 0104 07/02/24 0324  NA 138 136  K 4.6 3.1*  CL 101 98  CO2 22 26  GLUCOSE 297* 43*  BUN 8 7  CREATININE 0.45* 0.44*  CALCIUM  8.8* 8.1*  MG 1.5* 1.5*  PHOS  --  3.2   GFR: Estimated Creatinine Clearance: 91.5 mL/min (A) (by C-G formula based on SCr of 0.44 mg/dL (L)). Liver Function Tests: Recent Labs  Lab 07/01/24 0104  AST 143*  ALT 45*  ALKPHOS 498*  BILITOT 3.2*  PROT 9.8*  ALBUMIN 3.3*   No results for input(s): LIPASE, AMYLASE in the last 168 hours. No results for input(s): AMMONIA in the last 168 hours. Coagulation Profile: No results for input(s): INR, PROTIME in the last 168 hours. Cardiac Enzymes: Recent Labs  Lab 07/01/24 1440  CKTOTAL 77   BNP (last 3 results) No results for input(s): PROBNP in the last 8760 hours. HbA1C: No results for input(s): HGBA1C in the last 72 hours. CBG: Recent Labs  Lab 07/01/24 1652 07/01/24 2116 07/02/24 0516 07/02/24 0536 07/02/24 0730  GLUCAP 72 161* 78 118* 152*   Lipid Profile: No results for input(s): CHOL, HDL, LDLCALC,  TRIG, CHOLHDL, LDLDIRECT in the last 72 hours. Thyroid  Function Tests: No results for input(s): TSH, T4TOTAL, FREET4, T3FREE, THYROIDAB in the last 72 hours. Anemia Panel: No results for input(s): VITAMINB12, FOLATE, FERRITIN, TIBC, IRON , RETICCTPCT in the last 72 hours. Sepsis Labs: No results for input(s): PROCALCITON, LATICACIDVEN in the last 168 hours.  Recent Results (from the past 240 hours)  Culture, blood (single)     Status: None   Collection Time: 06/23/24 10:57 PM   Specimen: BLOOD LEFT ARM  Result Value Ref Range Status   Specimen Description BLOOD LEFT ARM  Final   Special Requests   Final  BOTTLES DRAWN AEROBIC AND ANAEROBIC Blood Culture adequate volume   Culture   Final    NO GROWTH 5 DAYS Performed at [facility], [address] [place], [place] [address]    Report Status 06/29/2024 FINAL  Final  Culture, blood (Routine X 2) w Reflex to ID Panel     Status: None   Collection Time: 06/24/24  8:07 AM   Specimen: BLOOD RIGHT ARM  Result Value Ref Range Status   Specimen Description BLOOD RIGHT ARM  Final   Special Requests   Final    BOTTLES DRAWN AEROBIC AND ANAEROBIC Blood Culture results may not be optimal due to an inadequate volume of blood received in culture bottles   Culture   Final    NO GROWTH 5 DAYS Performed at [facility], [address] [place], [place] [address]    Report Status 06/29/2024 FINAL  Final  Culture, blood (Routine X 2) w Reflex to ID Panel     Status: None   Collection Time: 06/24/24  8:12 AM   Specimen: BLOOD RIGHT HAND  Result Value Ref Range Status   Specimen Description BLOOD RIGHT HAND  Final   Special Requests   Final    BOTTLES DRAWN AEROBIC AND ANAEROBIC Blood Culture results may not be optimal due to an inadequate volume of blood received in culture bottles   Culture   Final    NO GROWTH 5 DAYS Performed at [facility], [address], [place], [place] [address]     Report Status 06/29/2024 FINAL  Final  MRSA Next Gen by PCR, Nasal     Status: None   Collection Time: 06/25/24 10:54 AM   Specimen: Nasal Mucosa; Nasal Swab  Result Value Ref Range Status   MRSA by PCR Next Gen NOT DETECTED NOT DETECTED Final    Comment: (NOTE) The GeneXpert MRSA Assay (FDA approved for NASAL specimens only), is one component of a comprehensive MRSA colonization surveillance program. It is not intended to diagnose MRSA infection nor to guide or monitor treatment for MRSA infections. Test performance is not FDA approved in patients less than 54 years old. Performed at [facility], [address] [place], [place] [address]   Culture, blood (Routine X 2) w Reflex to ID Panel     Status: None (Preliminary result)   Collection Time: 07/01/24  2:35 PM   Specimen: BLOOD RIGHT HAND  Result Value Ref Range Status   Specimen Description   Final    BLOOD RIGHT HAND Performed at [facility], [address], [place], [place] [address]    Special Requests   Final    BOTTLES DRAWN AEROBIC AND ANAEROBIC Blood Culture adequate volume Performed at [facility], [address] [place], [place] [address]    Culture   Final    NO GROWTH < 24 HOURS Performed at [facility], [address], [place], [place] [address]    Report Status PENDING  Incomplete  Culture, blood (Routine X 2) w Reflex to ID Panel     Status: None (Preliminary result)   Collection Time: 07/01/24  2:40 PM   Specimen: BLOOD RIGHT HAND  Result Value Ref Range Status   Specimen Description   Final    BLOOD RIGHT HAND Performed at [facility], [address], [place], [place] [address]    Special Requests   Final    BOTTLES DRAWN AEROBIC  AND ANAEROBIC Blood Culture adequate volume Performed at [facility], [address] [place], [place] [address]    Culture   Final    NO GROWTH < 24 HOURS Performed at [facility], [address], [place], [place] [address]    Report Status PENDING  Incomplete         Radiology Studies: DG Ankle 2 Views Right Result Date: 07/01/2024 EXAM: 2 VIEW(S) XRAY OF THE RIGHT ANKLE 07/01/2024 02:24:00 AM CLINICAL HISTORY: osteomyelitis COMPARISON: Comparison with 06/23/2024 and MRI 06/24/2024. FINDINGS: BONES AND JOINTS: Persistent osteomyelitis involving the distal tibia, fibula, and medial malleolus with involvement of the talar dome. Periprosthetic lucencies compatible with osteomyelitic involvement. Similar lateral dislocation of the talus in relation to the tibia with widening of the medial mortise. SOFT TISSUES: Extensive soft tissue swelling about the ankle. IMPRESSION: 1. Persistent osteomyelitis involving the distal tibia, fibula, medial malleolus, and talar dome. 2. Similar lateral dislocation of the talus relative to the tibia with widening of the medial mortise and extensive soft tissue swelling about the ankle. Electronically signed by: Norman Gatlin MD 07/01/2024 02:29 AM EST RP Workstation: [id number]           LOS: 1 day   Time spent= 35 mins    Burgess JAYSON Dare, MD [facility]  If 7PM-7AM, please contact night-coverage  07/02/2024, 11:01 AM  "

## 2024-07-02 NOTE — Inpatient Diabetes Management (Signed)
 Inpatient Diabetes Program Recommendations  AACE/ADA: New Consensus Statement on Inpatient Glycemic Control (2015)  Target Ranges:  Prepandial:   less than 140 mg/dL      Peak postprandial:   less than 180 mg/dL (1-2 hours)      Critically ill patients:  140 - 180 mg/dL   Lab Results  Component Value Date   GLUCAP 158 (H) 07/02/2024   HGBA1C 6.9 (H) 06/24/2024    Review of Glycemic Control  Diabetes history: DM2 Outpatient Diabetes medications: Lantus  32 units daily, Humalog  10 TID, FS Libre, metformin  1000 mg BID Current orders for Inpatient glycemic control: Semglee  10 daily, Novolog  0-24 TID  HgbA1C - 6.9%  Inpatient Diabetes Program Recommendations:    Consider:  Changing Novolog  to 0-9 TID with meals Novolog  4 units TID with meals if eating > 50%  Will continue to follow.  Thank you. Shona Brandy, RD, LDN, CDCES Inpatient Diabetes Coordinator [phone]

## 2024-07-03 LAB — PHOSPHORUS: Phosphorus: 2.2 mg/dL — ABNORMAL LOW (ref 2.5–4.6)

## 2024-07-03 LAB — GLUCOSE, CAPILLARY
Glucose-Capillary: 150 mg/dL — ABNORMAL HIGH (ref 70–99)
Glucose-Capillary: 159 mg/dL — ABNORMAL HIGH (ref 70–99)
Glucose-Capillary: 225 mg/dL — ABNORMAL HIGH (ref 70–99)
Glucose-Capillary: 206 mg/dL — ABNORMAL HIGH (ref 70–99)

## 2024-07-03 LAB — BASIC METABOLIC PANEL WITH GFR
Anion gap: 7 (ref 5–15)
BUN: <5 mg/dL — ABNORMAL LOW (ref 6–20)
CO2: 25 mmol/L (ref 22–32)
Calcium: 8.5 mg/dL — ABNORMAL LOW (ref 8.9–10.3)
Chloride: 95 mmol/L — ABNORMAL LOW (ref 98–111)
Creatinine, Ser: 0.47 mg/dL — ABNORMAL LOW (ref 0.61–1.24)
GFR, Estimated: >60 mL/min
Glucose, Bld: 174 mg/dL — ABNORMAL HIGH (ref 70–99)
Potassium: 4.6 mmol/L (ref 3.5–5.1)
Sodium: 127 mmol/L — ABNORMAL LOW (ref 135–145)

## 2024-07-03 LAB — MAGNESIUM: Magnesium: 1.4 mg/dL — ABNORMAL LOW (ref 1.7–2.4)

## 2024-07-03 MED ORDER — SODIUM CHLORIDE 0.9 % IV SOLN: INTRAVENOUS | Status: AC

## 2024-07-03 NOTE — Progress Notes (Addendum)
 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  Noted that he was not feeling well - He was seen by surgery at the last recommended BKA.  As previously noted antibiotics are futile given hardware, chronic infection as well as new abscess.   Recommendations:  -Follow blood cx  - daptomycin  and unsyn.  Agree with BKA as previously noted - Primary communicated with surgery who noted if patient becomes amenable to BKA then consult Dr. Harden on Monday.  Today, patient noted that he will never lose his leg - MRI -Contact precautions - Dr. Overton is covering this weekend   Microbiology:   Antibiotics: Dpato and unasyn  Cultures: Blood 12/25 Urine  Other   SUBJECTIVE: Sitting in bed. Able ot answer questions Interval: tmax 100 overnight  Review of Systems: Review of Systems  All other systems reviewed and are negative.    Scheduled Meds:  ferrous sulfate   325 mg Oral Q breakfast   folic acid   1 mg Oral Daily   insulin  aspart  0-24 Units Subcutaneous TID WC   insulin  glargine-yfgn  10 Units Subcutaneous Daily   levETIRAcetam   1,000 mg Oral BID   LORazepam   0-4 mg Intravenous Q12H   Or   LORazepam   0-4 mg Oral Q12H   LORazepam   0-4 mg Intravenous Q6H   Followed by   LORazepam   0-4 mg Intravenous Q12H   metoprolol  succinate  12.5 mg Oral Daily   multivitamin with minerals  1 tablet Oral Daily   pantoprazole   40 mg Oral BID   senna-docusate  2 tablet Oral QHS   thiamine   100 mg Oral Daily   Or   thiamine   100 mg Intravenous Daily    Continuous Infusions:  ampicillin -sulbactam (UNASYN ) IV 3 g (07/03/24 0234)   DAPTOmycin  Stopped (07/02/24 1517)   PRN Meds:.acetaminophen  **OR** acetaminophen , glucagon  (human recombinant), guaiFENesin , hydrALAZINE , HYDROcodone -acetaminophen , ipratropium-albuterol , LORazepam  **OR** LORazepam , metoprolol  tartrate, ondansetron  **OR** ondansetron  (ZOFRAN ) IV, senna-docusate, traZODone  Allergies[1]  OBJECTIVE: Vitals:   07/02/24 1238 07/02/24 1800 07/02/24 1954 07/03/24 0117  BP: (!) 141/92 (!) 142/89 119/73 (!) 140/84  Pulse: 83 88 83 86  Resp: 16  15 18   Temp: 97.6 F (36.4 C)  98.1 F (36.7 C) 99.2 F (37.3 C)  TempSrc: Oral  Oral Oral  SpO2: 100%  99% 100%  Weight:      Height:       Body mass index is 24.03 kg/m.  Physical Exam Constitutional:      General: He is not in acute distress.    Appearance: He is normal weight. He is not toxic-appearing.  HENT:     Head: Normocephalic and atraumatic.     Right Ear: External ear normal.     Left Ear: External ear normal.     Nose: No congestion or rhinorrhea.     Mouth/Throat:     Mouth: Mucous membranes are moist.     Pharynx: Oropharynx is clear.  Eyes:     Extraocular Movements: Extraocular movements intact.     Conjunctiva/sclera: Conjunctivae normal.  Pupils: Pupils are equal, round, and reactive to light.  Cardiovascular:     Rate and Rhythm: Normal rate and regular rhythm.     Heart sounds: No murmur heard.    No friction rub. No gallop.  Pulmonary:     Effort: Pulmonary effort is normal.     Breath sounds: Normal breath sounds.  Abdominal:     General: Abdomen is flat. Bowel sounds are normal.     Palpations: Abdomen is soft.  Musculoskeletal:        General: Swelling present.     Cervical back: Normal range of motion and neck supple.     Comments: Ankle swelling  Skin:    General: Skin is warm and dry.  Neurological:     General: No focal deficit present.     Mental Status: He is oriented to  person, place, and time.  Psychiatric:        Mood and Affect: Mood normal.       Lab Results Lab Results  Component Value Date   WBC 8.3 07/02/2024   HGB 7.3 (L) 07/02/2024   HCT 22.3 (L) 07/02/2024   MCV 72.6 (L) 07/02/2024   PLT 93 (L) 07/02/2024    Lab Results  Component Value Date   CREATININE 0.44 (L) 07/02/2024   BUN 7 07/02/2024   NA 136 07/02/2024   K 3.1 (L) 07/02/2024   CL 98 07/02/2024   CO2 26 07/02/2024    Lab Results  Component Value Date   ALT 45 (H) 07/01/2024   AST 143 (H) 07/01/2024   ALKPHOS 498 (H) 07/01/2024   BILITOT 3.2 (H) 07/01/2024        Loney Stank, MD [facility] 07/03/2024, 3:07 AM      [1]  Allergies Allergen Reactions   Iohexol  Other (See Comments)    Unknown reaction at 31 days old Mom at bedside reported patient was given injections of iohexol  in foot and resulted in hole in foot or foot got infected   Nsaids Other (See Comments)    Patient said he was told he could take Tylenol  ONLY.   "

## 2024-07-03 NOTE — Plan of Care (Signed)
  Problem: Safety: Goal: Ability to remain free from injury will improve Outcome: Progressing   Problem: Pain Managment: Goal: General experience of comfort will improve and/or be controlled Outcome: Progressing   Problem: Elimination: Goal: Will not experience complications related to urinary retention Outcome: Progressing

## 2024-07-03 NOTE — Progress Notes (Signed)
 " PROGRESS NOTE    Gary Mitchell  FMW:[id number] DOB: Oct 30, 1970 DOA: 07/01/2024 PCP: Delbert Clam, MD    Brief Narrative:   53 y.o. male with medical history significant of alcoholic hepatitis, A-fib, cirrhosis, type 2 diabetes, seizure who denting to emergency department due to poorly controlled blood sugar and lack of medications.  Also reported of swelling in the right ankle.  Recent x-ray head showed persistent osteomyelitis of tibia/fibula.  ID had recommended Unasyn /daptomycin  but patient left AMA and presented back to the ER.  During this hospitalization currently on IV antibiotics, repeat MRI ankle has been ordered but delayed due to concerns of  bed bugs. Discussed with orthopedic Dr Jerri, if patient agreeable for BKA, consult Dr. Harden on Monday  Assessment & Plan:   Generalized weakness Osteomyelitis of the right ankle -Recently admitted for sepsis secondary to right lower extremity osteomyelitis seen by orthopedic and infectious disease.  Patient declined BKA and opted for IV antibiotic, ID recommended IV daptomycin  and Unasyn  but patient ended up leaving AGAINST MEDICAL ADVICE.  For now we will continue IV daptomycin  and Unasyn  per ID - MRI of the right ankle is delayed due to live bedbugs.  But based off of previous MRI orthopedic has recommended amputation. - Spoke with Dr. Jerri 12/25.  For now continue IV antibiotics, if patient becomes agreeable for BKA, will consult Dr. Harden who should return on Monday  Hypomagnesemia - As needed repletion  Bedbugs - Nursing staff noted like bedbugs.  Appropriate hospital staff has been notified.  History of alcohol  use Transaminitis, alcohol  pattern -Alcohol  withdrawal protocol  Microcytic anemia - Recent iron  studies showed low saturation and low ferritin levels.  Will give him IV iron  and make outpatient referral  Diabetes mellitus type 2, uncontrolled due to hyper/hypoglycemia -Recent hemoglobin A1c 6.9.  Overnight  hypoglycemia therefore will reduce long-acting.  Continue sliding scale  History of seizure -Keppra   Essential hypertension -Continue home medication.  IV as needed  GERD -PPI  There is concerns of bedbugs.  Appropriate staff has been notified  DVT prophylaxis: Lovenox     Code Status: Full Code Family Communication:   Ongoing management for right ankle osteomyelitis   PT Follow up Recs:   Subjective:   Examination:  General exam: Appears calm and comfortable  Respiratory system: Clear to auscultation. Respiratory effort normal. Cardiovascular system: S1 & S2 heard, RRR. No JVD, murmurs, rubs, gallops or clicks. No pedal edema. Gastrointestinal system: Abdomen is nondistended, soft and nontender. No organomegaly or masses felt. Normal bowel sounds heard. Central nervous system: Alert and oriented. No focal neurological deficits. Extremities: Symmetric 5 x 5 power. Skin: Right ankle swelling Psychiatry: Judgement and insight appear normal. Mood & affect appropriate.                Diet Orders (From admission, onward)     Start     Ordered   07/01/24 1723  Diet Carb Modified Room service appropriate? Yes  Diet effective now       Question Answer Comment  Diet-HS Snack? Nothing   Calorie Level Medium 1600-2000   Fluid consistency: Thin   Room service appropriate? Yes      07/01/24 1723            Objective: Vitals:   07/02/24 1954 07/03/24 0117 07/03/24 0553 07/03/24 0820  BP: 119/73 (!) 140/84 (!) 141/93 (!) 144/92  Pulse: 83 86 86 85  Resp: 15 18 17 16   Temp: 98.1 F (36.7 C) 99.2  F (37.3 C) 98.3 F (36.8 C) 98.1 F (36.7 C)  TempSrc: Oral Oral Oral Oral  SpO2: 99% 100% 100% 99%  Weight:      Height:        Intake/Output Summary (Last 24 hours) at 07/03/2024 1041 Last data filed at 07/03/2024 1033 Gross per 24 hour  Intake 1978.14 ml  Output 2950 ml  Net -971.86 ml   Filed Weights   07/01/24 0010  Weight: 63.5 kg     Scheduled Meds:  ferrous sulfate   325 mg Oral Q breakfast   folic acid   1 mg Oral Daily   insulin  aspart  0-24 Units Subcutaneous TID WC   insulin  glargine-yfgn  10 Units Subcutaneous Daily   levETIRAcetam   1,000 mg Oral BID   LORazepam   0-4 mg Intravenous Q12H   Or   LORazepam   0-4 mg Oral Q12H   LORazepam   0-4 mg Intravenous Q12H   metoprolol  succinate  12.5 mg Oral Daily   multivitamin with minerals  1 tablet Oral Daily   pantoprazole   40 mg Oral BID   senna-docusate  2 tablet Oral QHS   thiamine   100 mg Oral Daily   Or   thiamine   100 mg Intravenous Daily   Continuous Infusions:  sodium chloride  50 mL/hr at 07/03/24 1029   ampicillin -sulbactam (UNASYN ) IV 3 g (07/03/24 0826)   DAPTOmycin  Stopped (07/02/24 1517)    Nutritional status     Body mass index is 24.03 kg/m.  Data Reviewed:   CBC: Recent Labs  Lab 07/01/24 0104 07/02/24 0324  WBC 8.1 8.3  NEUTROABS 4.6  --   HGB 9.0* 7.3*  HCT 28.2* 22.3*  MCV 74.6* 72.6*  PLT 107* 93*   Basic Metabolic Panel: Recent Labs  Lab 07/01/24 0104 07/02/24 0324 07/03/24 0824  NA 138 136 127*  K 4.6 3.1* 4.6  CL 101 98 95*  CO2 22 26 25   GLUCOSE 297* 43* 174*  BUN 8 7 <5*  CREATININE 0.45* 0.44* 0.47*  CALCIUM  8.8* 8.1* 8.5*  MG 1.5* 1.5* 1.4*  PHOS  --  3.2 2.2*   GFR: Estimated Creatinine Clearance: 91.5 mL/min (A) (by C-G formula based on SCr of 0.47 mg/dL (L)). Liver Function Tests: Recent Labs  Lab 07/01/24 0104  AST 143*  ALT 45*  ALKPHOS 498*  BILITOT 3.2*  PROT 9.8*  ALBUMIN 3.3*   No results for input(s): LIPASE, AMYLASE in the last 168 hours. No results for input(s): AMMONIA in the last 168 hours. Coagulation Profile: No results for input(s): INR, PROTIME in the last 168 hours. Cardiac Enzymes: Recent Labs  Lab 07/01/24 1440  CKTOTAL 77   BNP (last 3 results) No results for input(s): PROBNP in the last 8760 hours. HbA1C: No results for input(s): HGBA1C in the  last 72 hours. CBG: Recent Labs  Lab 07/02/24 0730 07/02/24 1231 07/02/24 1801 07/02/24 2150 07/03/24 0729  GLUCAP 152* 158* 187* 185* 150*   Lipid Profile: No results for input(s): CHOL, HDL, LDLCALC, TRIG, CHOLHDL, LDLDIRECT in the last 72 hours. Thyroid  Function Tests: No results for input(s): TSH, T4TOTAL, FREET4, T3FREE, THYROIDAB in the last 72 hours. Anemia Panel: No results for input(s): VITAMINB12, FOLATE, FERRITIN, TIBC, IRON , RETICCTPCT in the last 72 hours. Sepsis Labs: No results for input(s): PROCALCITON, LATICACIDVEN in the last 168 hours.  Recent Results (from the past 240 hours)  Culture, blood (single)     Status: None   Collection Time: 06/23/24 10:57 PM   Specimen: BLOOD LEFT  ARM  Result Value Ref Range Status   Specimen Description BLOOD LEFT ARM  Final   Special Requests   Final    BOTTLES DRAWN AEROBIC AND ANAEROBIC Blood Culture adequate volume   Culture   Final    NO GROWTH 5 DAYS Performed at [facility], [address], [place], [place] [address]    Report Status 06/29/2024 FINAL  Final  Culture, blood (Routine X 2) w Reflex to ID Panel     Status: None   Collection Time: 06/24/24  8:07 AM   Specimen: BLOOD RIGHT ARM  Result Value Ref Range Status   Specimen Description BLOOD RIGHT ARM  Final   Special Requests   Final    BOTTLES DRAWN AEROBIC AND ANAEROBIC Blood Culture results may not be optimal due to an inadequate volume of blood received in culture bottles   Culture   Final    NO GROWTH 5 DAYS Performed at [facility], [address] [place], [place] [address]    Report Status 06/29/2024 FINAL  Final  Culture, blood (Routine X 2) w Reflex to ID Panel     Status: None   Collection Time: 06/24/24  8:12 AM   Specimen: BLOOD RIGHT HAND  Result Value Ref Range Status   Specimen Description BLOOD RIGHT HAND  Final   Special Requests   Final    BOTTLES DRAWN AEROBIC AND ANAEROBIC Blood  Culture results may not be optimal due to an inadequate volume of blood received in culture bottles   Culture   Final    NO GROWTH 5 DAYS Performed at [facility], [address], [place], [place] [address]    Report Status 06/29/2024 FINAL  Final  MRSA Next Gen by PCR, Nasal     Status: None   Collection Time: 06/25/24 10:54 AM   Specimen: Nasal Mucosa; Nasal Swab  Result Value Ref Range Status   MRSA by PCR Next Gen NOT DETECTED NOT DETECTED Final    Comment: (NOTE) The GeneXpert MRSA Assay (FDA approved for NASAL specimens only), is one component of a comprehensive MRSA colonization surveillance program. It is not intended to diagnose MRSA infection nor to guide or monitor treatment for MRSA infections. Test performance is not FDA approved in patients less than 63 years old. Performed at [facility], [address], [place], [place] [address]   Culture, blood (Routine X 2) w Reflex to ID Panel     Status: None (Preliminary result)   Collection Time: 07/01/24  2:35 PM   Specimen: BLOOD RIGHT HAND  Result Value Ref Range Status   Specimen Description   Final    BLOOD RIGHT HAND Performed at [facility], [address], [place], [place] [address]    Special Requests   Final    BOTTLES DRAWN AEROBIC AND ANAEROBIC Blood Culture adequate volume Performed at [facility], [address], [place], [place] [address]    Culture   Final    NO GROWTH 2 DAYS Performed at [facility], [address], [place], [place] [address]    Report Status PENDING  Incomplete  Culture, blood (Routine X 2) w Reflex to ID Panel     Status: None (Preliminary result)   Collection Time: 07/01/24  2:40 PM   Specimen: BLOOD RIGHT HAND  Result Value Ref Range Status   Specimen Description   Final    BLOOD RIGHT HAND Performed at  [facility], [address] [place], [place] [address]    Special Requests   Final    BOTTLES  DRAWN AEROBIC AND ANAEROBIC Blood Culture adequate volume Performed at [facility], [address], [place], [place] [address]    Culture   Final    NO GROWTH 2 DAYS Performed at [facility], [address], [place], [place] [address]    Report Status PENDING  Incomplete         Radiology Studies: No results found.         LOS: 2 days   Time spent= 35 mins    Burgess JAYSON Dare, MD [facility]  If 7PM-7AM, please contact night-coverage  07/03/2024, 10:41 AM  "

## 2024-07-03 NOTE — Plan of Care (Signed)
   Problem: Activity: Goal: Risk for activity intolerance will decrease Outcome: Progressing   Problem: Nutrition: Goal: Adequate nutrition will be maintained Outcome: Progressing   Problem: Safety: Goal: Ability to remain free from injury will improve Outcome: Progressing

## 2024-07-03 NOTE — Consult Note (Signed)
 WOC Nurse Consult Note: Reason for Consult: ankle wound  Left medial malleolar  Right medial malleolar   septic arthritis of the tibiotalar joint with osteomyelitis of the distal tibia, distal fibula, and talus Complex effusion.  Wounds are chronic  Wound type: non healing wounds; related to joint osteomyelitis  Pressure Injury POA:NA Measurement: see nursing flow sheets Wound bed: pink; pale Drainage (amount, consistency, odor) see nursing flow sheets Periwound: intact  Dressing procedure/placement/frequency: Cleanse wounds with Vashe [name] [phone]), pat dry.  Cover wounds with single layer of xeroform and top with foam, change every other day  Consider consultation with orthopedics, based on MRI Re consult if needed, will not follow at this time. Thanks  [employer]endon [employer], RN,CWOCN, CNS, [employer] [phone]

## 2024-07-04 DIAGNOSIS — M869 Osteomyelitis, unspecified: Secondary | ICD-10-CM | POA: Diagnosis not present

## 2024-07-04 LAB — GLUCOSE, CAPILLARY
Glucose-Capillary: 173 mg/dL — ABNORMAL HIGH (ref 70–99)
Glucose-Capillary: 124 mg/dL — ABNORMAL HIGH (ref 70–99)
Glucose-Capillary: 272 mg/dL — ABNORMAL HIGH (ref 70–99)
Glucose-Capillary: 131 mg/dL — ABNORMAL HIGH (ref 70–99)

## 2024-07-04 LAB — CBC
HCT: 21.9 % — ABNORMAL LOW (ref 39.0–52.0)
Hemoglobin: 7.1 g/dL — ABNORMAL LOW (ref 13.0–17.0)
MCH: 23.7 pg — ABNORMAL LOW (ref 26.0–34.0)
MCHC: 32.4 g/dL (ref 30.0–36.0)
MCV: 73.2 fL — ABNORMAL LOW (ref 80.0–100.0)
Platelets: 144 K/uL — ABNORMAL LOW (ref 150–400)
RBC: 2.99 MIL/uL — ABNORMAL LOW (ref 4.22–5.81)
RDW: 28.8 % — ABNORMAL HIGH (ref 11.5–15.5)
WBC: 8.8 K/uL (ref 4.0–10.5)
nRBC: 0.3 % — ABNORMAL HIGH (ref 0.0–0.2)

## 2024-07-04 LAB — BASIC METABOLIC PANEL WITH GFR
Anion gap: 9 (ref 5–15)
BUN: 7 mg/dL (ref 6–20)
CO2: 21 mmol/L — ABNORMAL LOW (ref 22–32)
Calcium: 8.4 mg/dL — ABNORMAL LOW (ref 8.9–10.3)
Chloride: 96 mmol/L — ABNORMAL LOW (ref 98–111)
Creatinine, Ser: 0.49 mg/dL — ABNORMAL LOW (ref 0.61–1.24)
GFR, Estimated: >60 mL/min
Glucose, Bld: 205 mg/dL — ABNORMAL HIGH (ref 70–99)
Potassium: 4 mmol/L (ref 3.5–5.1)
Sodium: 126 mmol/L — ABNORMAL LOW (ref 135–145)

## 2024-07-04 MED ORDER — MAGNESIUM SULFATE 4 GM/100ML IV SOLN
4.0000 g | Freq: Once | INTRAVENOUS | Status: AC
  Administered 2024-07-04: 4 g via INTRAVENOUS
  Filled 2024-07-04: qty 100

## 2024-07-04 MED ORDER — SODIUM CHLORIDE 1 G PO TABS
1.0000 g | ORAL_TABLET | Freq: Three times a day (TID) | ORAL | Status: DC
  Administered 2024-07-04 – 2024-07-06 (×5): 1 g via ORAL
  Filled 2024-07-04 (×3): qty 1

## 2024-07-04 MED ORDER — SODIUM PHOSPHATES 45 MMOLE/15ML IV SOLN
15.0000 mmol | Freq: Once | INTRAVENOUS | Status: AC
  Administered 2024-07-04: 15 mmol via INTRAVENOUS
  Filled 2024-07-04: qty 5

## 2024-07-04 NOTE — Progress Notes (Signed)
 " PROGRESS NOTE  Gary Mitchell  FMW:[id number] DOB: Oct 08, 1970 DOA: 07/01/2024 PCP: Delbert Clam, MD   Brief Narrative: 53 y.o. male with medical history significant of alcoholic hepatitis, A-fib, cirrhosis, type 2 diabetes, seizure who denting to emergency department due to poorly controlled blood sugar and lack of medications.  Also reported of swelling in the right ankle.  Recent x-ray head showed persistent osteomyelitis of tibia/fibula.  ID had recommended Unasyn /daptomycin  but patient left AMA and presented back to the ER.  During this hospitalization currently on IV antibiotics, repeat MRI ankle has been ordered but delayed due to concerns of  bed bugs. Discussed with orthopedic Dr Jerri, if patient agreeable for BKA, consult Dr. Harden on Monday.  Patient continues to decline amputation or any kind of surgery.  Assessment & Plan:  Principal Problem:   Osteomyelitis (HCC)   Generalized weakness Osteomyelitis of the right ankle -Recently admitted for sepsis secondary to right lower extremity osteomyelitis seen by orthopedic and infectious disease.  Patient declined BKA and opted for IV antibiotic, ID recommended IV daptomycin  and Unasyn  but patient ended up leaving AGAINST MEDICAL ADVICE.  For now we will continue IV daptomycin  and Unasyn  per ID - MRI of the right ankle is delayed due to live bedbugs.  But based off of previous MRI orthopedic has recommended amputation. - Spoke with Dr. Jerri 12/25.  For now continue IV antibiotics, if patient becomes agreeable for BKA, will consult Dr. Harden who should return on Monday - Patient continues to decline amputation or any kind of surgery   Hypomagnesemia - As needed repletion   Bedbugs - Nursing staff noted like bedbugs.  Appropriate hospital staff has been notified.   History of alcohol  use Transaminitis, alcohol  pattern -Alcohol  withdrawal protocol  Hyponatremia: This is likely from chronic alcohol  abuse.  Started on salt tablets.    Microcytic anemia - Recent iron  studies showed low saturation and low ferritin levels.  Given iron  and make outpatient referral.  No evidence of acute blood loss.  Hemoglobin in the range of 7 today.  Will transfuse if it drops less than 7.   Diabetes mellitus type 2, uncontrolled due to hyper/hypoglycemia -Recent hemoglobin A1c 6.9.  Overnight hypoglycemia therefore will reduce long-acting.  Continue sliding scale   History of seizure -Keppra    Essential hypertension -Continue home medication.  IV as needed   GERD -PPI        DVT prophylaxis:SCDs Start: 07/01/24 0445     Code Status: Full Code  Family Communication: None at bedside  Patient status:Inpatient  Patient is from :home  Anticipated discharge un:ynfz  Estimated DC date:not sure   Consultants: Orthopedics  Procedures: None  Antimicrobials:  Anti-infectives (From admission, onward)    Start     Dose/Rate Route Frequency Ordered Stop   07/01/24 0800  Ampicillin -Sulbactam (UNASYN ) 3 g in sodium chloride  0.9 % 100 mL IVPB        3 g 200 mL/hr over 30 Minutes Intravenous Every 6 hours 07/01/24 0456     07/01/24 0130  DAPTOmycin  (CUBICIN ) IVPB 500 mg/32mL premix        8 mg/kg  63.5 kg 100 mL/hr over 30 Minutes Intravenous Daily 07/01/24 0122     07/01/24 0100  piperacillin -tazobactam (ZOSYN ) IVPB 3.375 g        3.375 g 100 mL/hr over 30 Minutes Intravenous  Once 07/01/24 0050 07/01/24 0155       Subjective: Patient seen and examined at the bedside today.  Hemodynamically stable.  Comfortable lying in bed.  Not in any kind of distress.  Has dressing on the right ankle.  Denies any significant pain.  Continues to decline amputation or surgery.  Objective: Vitals:   07/03/24 1727 07/03/24 2321 07/04/24 0654 07/04/24 0810  BP: 116/74 (!) 130/90 (!) 147/88 139/88  Pulse: 80 86 97 92  Resp:  18 18 16   Temp:  98.5 F (36.9 C) 98.1 F (36.7 C) 98 F (36.7 C)  TempSrc:  Oral Oral Oral  SpO2:  98%  100% 100%  Weight:      Height:        Intake/Output Summary (Last 24 hours) at 07/04/2024 1213 Last data filed at 07/04/2024 1030 Gross per 24 hour  Intake 3076.28 ml  Output 1600 ml  Net 1476.28 ml   Filed Weights   07/01/24 0010  Weight: 63.5 kg    Examination:  General exam: Overall comfortable, not in distress HEENT: PERRL Respiratory system:  no wheezes or crackles  Cardiovascular system: S1 & S2 heard, RRR.  Gastrointestinal system: Abdomen is nondistended, soft and nontender. Central nervous system: Alert and oriented Extremities: No edema, no clubbing ,no cyanosis, wound on the right ankle, covered with dressing Skin: No rashes, no icterus     Data Reviewed: I have personally reviewed following labs and imaging studies  CBC: Recent Labs  Lab 07/01/24 0104 07/02/24 0324 07/04/24 0847  WBC 8.1 8.3 8.8  NEUTROABS 4.6  --   --   HGB 9.0* 7.3* 7.1*  HCT 28.2* 22.3* 21.9*  MCV 74.6* 72.6* 73.2*  PLT 107* 93* 144*   Basic Metabolic Panel: Recent Labs  Lab 07/01/24 0104 07/02/24 0324 07/03/24 0824 07/04/24 0847  NA 138 136 127* 126*  K 4.6 3.1* 4.6 4.0  CL 101 98 95* 96*  CO2 22 26 25  21*  GLUCOSE 297* 43* 174* 205*  BUN 8 7 <5* 7  CREATININE 0.45* 0.44* 0.47* 0.49*  CALCIUM  8.8* 8.1* 8.5* 8.4*  MG 1.5* 1.5* 1.4*  --   PHOS  --  3.2 2.2*  --      Recent Results (from the past 240 hours)  MRSA Next Gen by PCR, Nasal     Status: None   Collection Time: 06/25/24 10:54 AM   Specimen: Nasal Mucosa; Nasal Swab  Result Value Ref Range Status   MRSA by PCR Next Gen NOT DETECTED NOT DETECTED Final    Comment: (NOTE) The GeneXpert MRSA Assay (FDA approved for NASAL specimens only), is one component of a comprehensive MRSA colonization surveillance program. It is not intended to diagnose MRSA infection nor to guide or monitor treatment for MRSA infections. Test performance is not FDA approved in patients less than 44 years old. Performed at [facility], [address], [place], [place] [address]   Culture, blood (Routine X 2) w Reflex to ID Panel     Status: None (Preliminary result)   Collection Time: 07/01/24  2:35 PM   Specimen: BLOOD RIGHT HAND  Result Value Ref Range Status   Specimen Description   Final    BLOOD RIGHT HAND Performed at [facility], [address], [place], [place] [address]    Special Requests   Final    BOTTLES DRAWN AEROBIC AND ANAEROBIC Blood Culture adequate volume Performed at [facility], [address], [place], [place] [address]    Culture   Final    NO GROWTH 3 DAYS Performed at [facility],  [address] [place], [place] [address]    Report Status PENDING  Incomplete  Culture, blood (Routine X 2) w Reflex to ID Panel     Status: None (Preliminary result)   Collection Time: 07/01/24  2:40 PM   Specimen: BLOOD RIGHT HAND  Result Value Ref Range Status   Specimen Description   Final    BLOOD RIGHT HAND Performed at [facility], [address], [place], [place] [address]    Special Requests   Final    BOTTLES DRAWN AEROBIC AND ANAEROBIC Blood Culture adequate volume Performed at [facility], [address], [place], [place] [address]    Culture   Final    NO GROWTH 3 DAYS Performed at [facility], [address] [place], [place] [address]    Report Status PENDING  Incomplete     Radiology Studies: MR ANKLE RIGHT W WO CONTRAST Result Date: 07/03/2024 CLINICAL DATA:  Osteomyelitis.  Swelling. EXAM: MRI OF THE RIGHT ANKLE WITHOUT AND WITH CONTRAST TECHNIQUE: Multiplanar, multisequence MR imaging of the ankle was performed before and after the administration of intravenous contrast. CONTRAST:  6mL GADAVIST  GADOBUTROL  1 MMOL/ML IV SOLN COMPARISON:  Right ankle radiographs dated 07/01/2024. MRI of the right ankle dated 06/24/2024. FINDINGS: Bones/Joints: Chronic posttraumatic ankle deformity with  similar approximately 1 cm of lateral displacement of the talus relative to the tibial plafond and destructive changes/resorption of the talar dome and tibial plafond. Redemonstrated septic arthritis of the tibiotalar joint with osteomyelitis of the distal tibia, distal fibula, and talus. Two screws again noted traversing a persistent medial malleolar fracture. Increased size of a complex tibiotalar joint effusion with synovitis. As before, the joint effusion extends into the fracture plane of the distal fibula with similar findings concerning for lateral draining sinus tract from the suspected septic joint to the skin surface. Soft Tissue: Increased size of a now 1.9 x 1.1 cm heterogenous subcutaneous fluid collection with peripheral enhancement medial to the proximal margins of the medial malleolar screws, concerning for infection. Tendons: Peroneal: Peroneal longus tendon intact. Peroneal brevis intact. Posteromedial: Redemonstrated tear of the posterior tibialis tendon near the level of the medial malleolus. Similar enhancement of the flexor hallucis longus tendon sheath, infectious tenosynovitis can not be excluded. Flexor digitorum longus tendon intact. Anterior: Tibialis anterior tendon intact. Extensor hallucis longus tendon intact Extensor digitorum longus tendon intact. Achilles:  Intact with similar mild distal Achilles tendinopathy. Plantar Fascia: Intact. Ligaments: Lateral: Intact. Disruption of the distal tibiofibular syndesmotic ligaments, unchanged. Medial: Intact. IMPRESSION: IMPRESSION 1. Redemonstrated septic arthritis of the tibiotalar joint with osteomyelitis of the distal tibia, distal fibula, and talus. Chronic posttraumatic ankle deformity with similar approximately 1 cm of lateral displacement of the talus relative to the tibial plafond and destructive changes/resorption of the talar dome and tibial plafond. 2. Increased size of a complex tibiotalar joint effusion with synovitis. As before,  the joint effusion extends into the fracture plane of the distal fibula with similar findings concerning for lateral draining sinus tract from the suspected septic joint to the skin surface. 3. Increased size of a now 1.9 x 1.1 cm subcutaneous complex fluid collection medial to the proximal margins of the medial malleolar screws, concerning for infection. 4. Similar enhancement of the flexor hallucis longus tendon sheath, infectious tenosynovitis can not be excluded. 5. Redemonstrated tear of the posterior tibialis tendon near the medial malleolus. 6. Redemonstrated disruption of the distal tibiofibular syndesmotic ligaments. Electronically Signed   By:  Harrietta Sherry M.D.   On: 07/03/2024 14:55    Scheduled Meds:  ferrous sulfate   325 mg Oral Q breakfast   folic acid   1 mg Oral Daily   insulin  aspart  0-24 Units Subcutaneous TID WC   insulin  glargine-yfgn  10 Units Subcutaneous Daily   levETIRAcetam   1,000 mg Oral BID   LORazepam   0-4 mg Intravenous Q12H   Or   LORazepam   0-4 mg Oral Q12H   LORazepam   0-4 mg Intravenous Q12H   metoprolol  succinate  12.5 mg Oral Daily   multivitamin with minerals  1 tablet Oral Daily   pantoprazole   40 mg Oral BID   senna-docusate  2 tablet Oral QHS   sodium chloride   1 g Oral TID WC   thiamine   100 mg Oral Daily   Or   thiamine   100 mg Intravenous Daily   Continuous Infusions:  ampicillin -sulbactam (UNASYN ) IV Stopped (07/04/24 0815)   DAPTOmycin  Stopped (07/03/24 1505)   sodium PHOSPHATE  IVPB (in mmol) 43 mL/hr at 07/04/24 1028     LOS: 3 days   Ivonne Mustache, MD [facility] P12/28/2025, 12:13 PM  "

## 2024-07-04 NOTE — Plan of Care (Signed)

## 2024-07-04 NOTE — Plan of Care (Signed)

## 2024-07-05 DIAGNOSIS — M869 Osteomyelitis, unspecified: Secondary | ICD-10-CM | POA: Diagnosis not present

## 2024-07-05 DIAGNOSIS — L0291 Cutaneous abscess, unspecified: Secondary | ICD-10-CM | POA: Diagnosis not present

## 2024-07-05 LAB — GLUCOSE, CAPILLARY
Glucose-Capillary: 173 mg/dL — ABNORMAL HIGH (ref 70–99)
Glucose-Capillary: 100 mg/dL — ABNORMAL HIGH (ref 70–99)
Glucose-Capillary: 175 mg/dL — ABNORMAL HIGH (ref 70–99)
Glucose-Capillary: 251 mg/dL — ABNORMAL HIGH (ref 70–99)

## 2024-07-05 LAB — BASIC METABOLIC PANEL WITH GFR
Anion gap: 12 (ref 5–15)
BUN: 8 mg/dL (ref 6–20)
CO2: 18 mmol/L — ABNORMAL LOW (ref 22–32)
Calcium: 8.5 mg/dL — ABNORMAL LOW (ref 8.9–10.3)
Chloride: 96 mmol/L — ABNORMAL LOW (ref 98–111)
Creatinine, Ser: 0.58 mg/dL — ABNORMAL LOW (ref 0.61–1.24)
GFR, Estimated: >60 mL/min
Glucose, Bld: 190 mg/dL — ABNORMAL HIGH (ref 70–99)
Potassium: 4.4 mmol/L (ref 3.5–5.1)
Sodium: 127 mmol/L — ABNORMAL LOW (ref 135–145)

## 2024-07-05 LAB — CBC
HCT: 24.3 % — ABNORMAL LOW (ref 39.0–52.0)
Hemoglobin: 8.1 g/dL — ABNORMAL LOW (ref 13.0–17.0)
MCH: 24.5 pg — ABNORMAL LOW (ref 26.0–34.0)
MCHC: 33.3 g/dL (ref 30.0–36.0)
MCV: 73.4 fL — ABNORMAL LOW (ref 80.0–100.0)
Platelets: 170 K/uL (ref 150–400)
RBC: 3.31 MIL/uL — ABNORMAL LOW (ref 4.22–5.81)
RDW: 29.8 % — ABNORMAL HIGH (ref 11.5–15.5)
WBC: 12.3 K/uL — ABNORMAL HIGH (ref 4.0–10.5)
nRBC: 0 % (ref 0.0–0.2)

## 2024-07-05 MED ORDER — ADULT MULTIVITAMIN W/MINERALS CH
1.0000 | ORAL_TABLET | Freq: Every day | ORAL | Status: DC
  Administered 2024-07-06 – 2024-07-07 (×2): 1 via ORAL
  Filled 2024-07-05 (×2): qty 1

## 2024-07-05 MED ORDER — PROCHLORPERAZINE EDISYLATE 10 MG/2ML IJ SOLN
10.0000 mg | INTRAMUSCULAR | Status: DC | PRN
  Administered 2024-07-05 – 2024-07-06 (×2): 10 mg via INTRAVENOUS
  Filled 2024-07-05 (×2): qty 2

## 2024-07-05 MED ORDER — DOXYCYCLINE HYCLATE 100 MG PO TABS
100.0000 mg | ORAL_TABLET | Freq: Two times a day (BID) | ORAL | Status: DC
  Administered 2024-07-05 – 2024-07-07 (×4): 100 mg via ORAL
  Filled 2024-07-05 (×4): qty 1

## 2024-07-05 MED ORDER — FERROUS SULFATE 325 (65 FE) MG PO TABS
325.0000 mg | ORAL_TABLET | Freq: Every day | ORAL | Status: DC
  Administered 2024-07-06 – 2024-07-07 (×2): 325 mg via ORAL
  Filled 2024-07-05 (×2): qty 1

## 2024-07-05 MED ORDER — AMOXICILLIN-POT CLAVULANATE 875-125 MG PO TABS
1.0000 | ORAL_TABLET | Freq: Two times a day (BID) | ORAL | Status: DC
  Administered 2024-07-05 – 2024-07-07 (×4): 1 via ORAL
  Filled 2024-07-05 (×4): qty 1

## 2024-07-05 NOTE — Plan of Care (Signed)
  Problem: Education: Goal: Knowledge of General Education information will improve Description: Including pain rating scale, medication(s)/side effects and non-pharmacologic comfort measures Outcome: Progressing   Problem: Activity: Goal: Risk for activity intolerance will decrease Outcome: Progressing   Problem: Pain Managment: Goal: General experience of comfort will improve and/or be controlled Outcome: Progressing

## 2024-07-05 NOTE — Progress Notes (Signed)
 " PROGRESS NOTE  Gary Mitchell  FMW:[id number] DOB: February 19, 1971 DOA: 07/01/2024 PCP: Delbert Clam, MD   Brief Narrative: 53 y.o. male with medical history significant of alcoholic hepatitis, A-fib, cirrhosis, type 2 diabetes, seizure who denting to emergency department due to poorly controlled blood sugar and lack of medications.  Also reported of swelling in the right ankle.  Recent x-ray head showed persistent osteomyelitis of tibia/fibula.  ID had recommended Unasyn /daptomycin  but patient left AMA and presented back to the ER.  During this hospitalization currently on IV antibiotics, repeat MRI ankle has been ordered but delayed due to concerns of  bed bugs.  Patient continues to decline amputation or any kind of surgery.  Case discussed with Dr. Harden.  ID consulted for recommendation for antibiotics.  PT consulted for discharge planning.  Assessment & Plan:  Principal Problem:   Osteomyelitis (HCC)   Generalized weakness Osteomyelitis of the right ankle -Recently admitted for sepsis secondary to right lower extremity osteomyelitis seen by orthopedic and infectious disease.  Patient declined BKA and opted for IV antibiotic, ID recommended IV daptomycin  and Unasyn  but patient ended up leaving AGAINST MEDICAL ADVICE. Restarted  IV daptomycin  and Unasyn   - MRI of the right ankle is delayed due to live bedbugs.  But based off of previous MRI orthopedic has recommended amputation. - Patient continues to decline amputation or any kind of surgery.  Case discussed with Dr. Harden.  There is no option besides amputation. -ID consulted for recommendation of oral antibiotic -PT consulted   Hypomagnesemia - As needed repletion   Bedbugs - Nursing staff noted like bedbugs.  Appropriate hospital staff has been notified.   History of alcohol  use Transaminitis, alcohol  pattern -Alcohol  withdrawal protocol  Hyponatremia: This is likely from chronic alcohol  abuse.  Started on salt tablets.    Microcytic anemia - Recent iron  studies showed low saturation and low ferritin levels.  Given iron  and make outpatient referral.  No evidence of acute blood loss.  Hemoglobin in the range of 7 today.  Will transfuse if it drops less than 7.   Diabetes mellitus type 2, uncontrolled due to hyper/hypoglycemia -Recent hemoglobin A1c 6.9.  Overnight hypoglycemia therefore will reduce long-acting.  Continue sliding scale   History of seizure -Keppra    Essential hypertension -Continue home medication.  IV as needed   GERD -Continue PPI        DVT prophylaxis:SCDs Start: 07/01/24 0445     Code Status: Full Code  Family Communication: None at bedside  Patient status:Inpatient  Patient is from :home  Anticipated discharge un:ynfz  Estimated DC date:not sure   Consultants: Orthopedics,ID  Procedures: None  Antimicrobials:  Anti-infectives (From admission, onward)    Start     Dose/Rate Route Frequency Ordered Stop   07/01/24 0800  Ampicillin -Sulbactam (UNASYN ) 3 g in sodium chloride  0.9 % 100 mL IVPB        3 g 200 mL/hr over 30 Minutes Intravenous Every 6 hours 07/01/24 0456     07/01/24 0130  DAPTOmycin  (CUBICIN ) IVPB 500 mg/46mL premix        8 mg/kg  63.5 kg 100 mL/hr over 30 Minutes Intravenous Daily 07/01/24 0122     07/01/24 0100  piperacillin -tazobactam (ZOSYN ) IVPB 3.375 g        3.375 g 100 mL/hr over 30 Minutes Intravenous  Once 07/01/24 0050 07/01/24 0155       Subjective: Patient seen and examined at bedside today.  Hemodynamically stable.  Has some nausea and vomited  a few times this morning.  Abdomen is soft and nondistended.  Had a bowel movement yesterday. Continues to decline any kind of surgical intervention on the right ankle  Objective: Vitals:   07/04/24 1358 07/04/24 1726 07/04/24 2256 07/05/24 0627  BP: 121/75 129/79 127/84 122/78  Pulse: 80 79 87 88  Resp: 16  16 18   Temp: 98.4 F (36.9 C)  99.2 F (37.3 C) 98.8 F (37.1 C)   TempSrc: Oral  Oral   SpO2: 100%  100% 99%  Weight:      Height:        Intake/Output Summary (Last 24 hours) at 07/05/2024 1120 Last data filed at 07/05/2024 9372 Gross per 24 hour  Intake 1626.7 ml  Output 1825 ml  Net -198.3 ml   Filed Weights   07/01/24 0010  Weight: 63.5 kg    Examination:   General exam: Overall comfortable, not in distress HEENT: PERRL Respiratory system:  no wheezes or crackles  Cardiovascular system: S1 & S2 heard, RRR.  Gastrointestinal system: Abdomen is nondistended, soft and nontender. Central nervous system: Alert and oriented Extremities: No edema, no clubbing ,no cyanosis.  Wound on the right ankle covered with dressing Skin: No rashes, no ulcers,no icterus     Data Reviewed: I have personally reviewed following labs and imaging studies  CBC: Recent Labs  Lab 07/01/24 0104 07/02/24 0324 07/04/24 0847 07/05/24 0327  WBC 8.1 8.3 8.8 12.3*  NEUTROABS 4.6  --   --   --   HGB 9.0* 7.3* 7.1* 8.1*  HCT 28.2* 22.3* 21.9* 24.3*  MCV 74.6* 72.6* 73.2* 73.4*  PLT 107* 93* 144* 170   Basic Metabolic Panel: Recent Labs  Lab 07/01/24 0104 07/02/24 0324 07/03/24 0824 07/04/24 0847 07/05/24 0327  NA 138 136 127* 126* 127*  K 4.6 3.1* 4.6 4.0 4.4  CL 101 98 95* 96* 96*  CO2 22 26 25  21* 18*  GLUCOSE 297* 43* 174* 205* 190*  BUN 8 7 <5* 7 8  CREATININE 0.45* 0.44* 0.47* 0.49* 0.58*  CALCIUM  8.8* 8.1* 8.5* 8.4* 8.5*  MG 1.5* 1.5* 1.4*  --   --   PHOS  --  3.2 2.2*  --   --      Recent Results (from the past 240 hours)  Culture, blood (Routine X 2) w Reflex to ID Panel     Status: None (Preliminary result)   Collection Time: 07/01/24  2:35 PM   Specimen: BLOOD RIGHT HAND  Result Value Ref Range Status   Specimen Description   Final    BLOOD RIGHT HAND Performed at [facility], [address], [place], [place] [address]    Special Requests   Final    BOTTLES DRAWN AEROBIC AND ANAEROBIC Blood Culture  adequate volume Performed at [facility], [address], [place], [place] [address]    Culture   Final    NO GROWTH 4 DAYS Performed at [facility], [address], [place], [place] [address]    Report Status PENDING  Incomplete  Culture, blood (Routine X 2) w Reflex to ID Panel     Status: None (Preliminary result)   Collection Time: 07/01/24  2:40 PM   Specimen: BLOOD RIGHT HAND  Result Value Ref Range Status   Specimen Description   Final    BLOOD RIGHT HAND Performed at [facility], [address], [place], [place] [address]    Special Requests   Final  BOTTLES DRAWN AEROBIC AND ANAEROBIC Blood Culture adequate volume Performed at [facility], [address] [place], [place] [address]    Culture   Final    NO GROWTH 4 DAYS Performed at [facility], [address], [place], [place] [address]    Report Status PENDING  Incomplete     Radiology Studies: No results found.   Scheduled Meds:  ferrous sulfate   325 mg Oral Q breakfast   folic acid   1 mg Oral Daily   insulin  aspart  0-24 Units Subcutaneous TID WC   insulin  glargine-yfgn  10 Units Subcutaneous Daily   levETIRAcetam   1,000 mg Oral BID   metoprolol  succinate  12.5 mg Oral Daily   multivitamin with minerals  1 tablet Oral Daily   pantoprazole   40 mg Oral BID   senna-docusate  2 tablet Oral QHS   sodium chloride   1 g Oral TID WC   thiamine   100 mg Oral Daily   Or   thiamine   100 mg Intravenous Daily   Continuous Infusions:  ampicillin -sulbactam (UNASYN ) IV 3 g (07/05/24 0954)   DAPTOmycin  Stopped (07/04/24 1514)     LOS: 4 days   Ivonne Mustache, MD [facility] P12/29/2025, 11:20 AM  "

## 2024-07-05 NOTE — Progress Notes (Signed)
 PT Cancellation Note  Patient Details Name: Gary Mitchell MRN: [id number] DOB: 01/22/1971   Cancelled Treatment:    Reason Eval/Treat Not Completed: Fatigue/lethargy limiting ability to participate. PT arrived 1520 and unable to verbally rouse pt. Pt is awaiting imaging and orthopedic consult. PT to return as scheduled allows. PT to continue to follow acutely.   [place], PT Acute Rehab   [place] VEAR Drone 07/05/2024, 3:26 PM

## 2024-07-05 NOTE — Progress Notes (Signed)
 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  Noted that he was not feeling well - He was seen by surgery at the last recommended BKA.  As previously noted antibiotics are futile given hardware, chronic infection as well as new abscess. -- he has failed several courses of abx treatment in the past   12/25 admission bcx negative Discussed I have nothing to offer him in terms of curable treatment. He needs surgery. He continues to hesitate. I am willing to advise 4 weeks more of doxy/augmentin  and he'll need to follow up ortho for definitive management    Recommendations:  -change abx to doxy/augmentin  -can provide 4 weeks more -id has nothing else to offer -f/u ortho outpatient -maintain standard isolation precaution -will sign off -discuss with primary team   Microbiology:   Antibiotics: Dpato and unasyn  Cultures: Blood 12/25 Urine  Other   SUBJECTIVE: No complaint Doesn't want surgery  I am walking better on my foot Afebrile   Review of Systems: Review of Systems  All other systems reviewed and are negative.    Scheduled Meds:  ferrous sulfate   325 mg Oral Q breakfast   folic acid   1 mg Oral Daily   insulin  aspart  0-24 Units Subcutaneous TID WC   insulin  glargine-yfgn  10 Units Subcutaneous Daily   levETIRAcetam   1,000 mg Oral BID   metoprolol  succinate  12.5 mg Oral Daily   multivitamin with minerals  1 tablet Oral Daily   pantoprazole   40 mg Oral BID   senna-docusate  2 tablet Oral QHS   sodium chloride   1 g  Oral TID WC   thiamine   100 mg Oral Daily   Or   thiamine   100 mg Intravenous Daily   Continuous Infusions:  ampicillin -sulbactam (UNASYN ) IV 3 g (07/05/24 1339)   DAPTOmycin  500 mg (07/05/24 1435)   PRN Meds:.acetaminophen  **OR** acetaminophen , glucagon  (human recombinant), guaiFENesin , hydrALAZINE , ipratropium-albuterol , metoprolol  tartrate, ondansetron  **OR** ondansetron  (ZOFRAN ) IV, prochlorperazine , senna-docusate, traZODone  Allergies[1]  OBJECTIVE: Vitals:   07/04/24 1358 07/04/24 1726 07/04/24 2256 07/05/24 0627  BP: 121/75 129/79 127/84 122/78  Pulse: 80 79 87 88  Resp: 16  16 18   Temp: 98.4 F (36.9 C)  99.2 F (37.3 C) 98.8 F (37.1 C)  TempSrc: Oral  Oral   SpO2: 100%  100% 99%  Weight:      Height:       Body mass index is 24.03 kg/m.  Physical Exam Constitutional:      General: He is not in acute distress.    Appearance: He is normal weight. He is not toxic-appearing.  HENT:     Head: Normocephalic and atraumatic.     Right Ear: External ear normal.     Left Ear: External ear normal.     Nose: No congestion or rhinorrhea.     Mouth/Throat:     Mouth: Mucous membranes are moist.     Pharynx: Oropharynx is clear.  Eyes:     Extraocular Movements: Extraocular movements intact.     Conjunctiva/sclera: Conjunctivae  normal.     Pupils: Pupils are equal, round, and reactive to light.  Cardiovascular:     Rate and Rhythm: Normal rate and regular rhythm.     Heart sounds: No murmur heard.    No friction rub. No gallop.  Pulmonary:     Effort: Pulmonary effort is normal.     Breath sounds: Normal breath sounds.  Abdominal:     General: Abdomen is flat. Bowel sounds are normal.     Palpations: Abdomen is soft.  Musculoskeletal:        General: Swelling present.     Cervical back: Normal range of motion and neck supple.     Comments: Ankle swelling  Skin:    General: Skin is warm and dry.  Neurological:     General: No focal deficit present.     Mental  Status: He is oriented to person, place, and time.  Psychiatric:        Mood and Affect: Mood normal.       Lab Results Lab Results  Component Value Date   WBC 12.3 (H) 07/05/2024   HGB 8.1 (L) 07/05/2024   HCT 24.3 (L) 07/05/2024   MCV 73.4 (L) 07/05/2024   PLT 170 07/05/2024    Lab Results  Component Value Date   CREATININE 0.58 (L) 07/05/2024   BUN 8 07/05/2024   NA 127 (L) 07/05/2024   K 4.4 07/05/2024   CL 96 (L) 07/05/2024   CO2 18 (L) 07/05/2024    Lab Results  Component Value Date   ALT 45 (H) 07/01/2024   AST 143 (H) 07/01/2024   ALKPHOS 498 (H) 07/01/2024   BILITOT 3.2 (H) 07/01/2024     Imaging: Reviewed  1. Redemonstrated septic arthritis of the tibiotalar joint with osteomyelitis of the distal tibia, distal fibula, and talus. Chronic posttraumatic ankle deformity with similar approximately 1 cm of lateral displacement of the talus relative to the tibial plafond and destructive changes/resorption of the talar dome and tibial plafond. 2. Increased size of a complex tibiotalar joint effusion with synovitis. As before, the joint effusion extends into the fracture plane of the distal fibula with similar findings concerning for lateral draining sinus tract from the suspected septic joint to the skin surface. 3. Increased size of a now 1.9 x 1.1 cm subcutaneous complex fluid collection medial to the proximal margins of the medial malleolar screws, concerning for infection. 4. Similar enhancement of the flexor hallucis longus tendon sheath, infectious tenosynovitis can not be excluded. 5. Redemonstrated tear of the posterior tibialis tendon near the medial malleolus. 6. Redemonstrated disruption of the distal tibiofibular syndesmotic ligaments.  Constance ONEIDA Passer, MD [facility] 07/05/2024, 7:27 PM       [1]  Allergies Allergen Reactions   Iohexol  Other (See Comments)    Unknown reaction at 59 days  old Mom at bedside reported patient was given injections of iohexol  in foot and resulted in hole in foot or foot got infected   Nsaids Other (See Comments)    Patient said he was told he could take Tylenol  ONLY.   "

## 2024-07-06 DIAGNOSIS — M869 Osteomyelitis, unspecified: Secondary | ICD-10-CM | POA: Diagnosis not present

## 2024-07-06 LAB — CULTURE, BLOOD (ROUTINE X 2)
Culture: NO GROWTH
Culture: NO GROWTH
Special Requests: ADEQUATE
Special Requests: ADEQUATE

## 2024-07-06 LAB — GLUCOSE, CAPILLARY
Glucose-Capillary: 266 mg/dL — ABNORMAL HIGH (ref 70–99)
Glucose-Capillary: 89 mg/dL (ref 70–99)
Glucose-Capillary: 110 mg/dL — ABNORMAL HIGH (ref 70–99)
Glucose-Capillary: 224 mg/dL — ABNORMAL HIGH (ref 70–99)

## 2024-07-06 LAB — BASIC METABOLIC PANEL WITH GFR
Anion gap: 11 (ref 5–15)
BUN: 8 mg/dL (ref 6–20)
CO2: 19 mmol/L — ABNORMAL LOW (ref 22–32)
Calcium: 8.2 mg/dL — ABNORMAL LOW (ref 8.9–10.3)
Chloride: 94 mmol/L — ABNORMAL LOW (ref 98–111)
Creatinine, Ser: 0.61 mg/dL (ref 0.61–1.24)
GFR, Estimated: >60 mL/min
Glucose, Bld: 283 mg/dL — ABNORMAL HIGH (ref 70–99)
Potassium: 4.2 mmol/L (ref 3.5–5.1)
Sodium: 124 mmol/L — ABNORMAL LOW (ref 135–145)

## 2024-07-06 LAB — CBC
HCT: 25.3 % — ABNORMAL LOW (ref 39.0–52.0)
Hemoglobin: 8.2 g/dL — ABNORMAL LOW (ref 13.0–17.0)
MCH: 24.4 pg — ABNORMAL LOW (ref 26.0–34.0)
MCHC: 32.4 g/dL (ref 30.0–36.0)
MCV: 75.3 fL — ABNORMAL LOW (ref 80.0–100.0)
Platelets: 176 K/uL (ref 150–400)
RBC: 3.36 MIL/uL — ABNORMAL LOW (ref 4.22–5.81)
RDW: 30.9 % — ABNORMAL HIGH (ref 11.5–15.5)
WBC: 13.4 K/uL — ABNORMAL HIGH (ref 4.0–10.5)
nRBC: 0 % (ref 0.0–0.2)

## 2024-07-06 MED ORDER — SODIUM CHLORIDE 0.9 % IV SOLN: INTRAVENOUS | Status: DC

## 2024-07-06 MED ORDER — INSULIN GLARGINE-YFGN 100 UNIT/ML ~~LOC~~ SOLN: 20.0000 [IU] | Freq: Every day | SUBCUTANEOUS | Status: DC

## 2024-07-06 MED ORDER — SODIUM CHLORIDE 1 G PO TABS
2.0000 g | ORAL_TABLET | Freq: Two times a day (BID) | ORAL | Status: DC
  Administered 2024-07-06 – 2024-07-07 (×2): 2 g via ORAL
  Filled 2024-07-06 (×4): qty 2

## 2024-07-06 MED ORDER — INSULIN GLARGINE 100 UNIT/ML ~~LOC~~ SOLN
20.0000 [IU] | Freq: Every day | SUBCUTANEOUS | Status: DC
  Administered 2024-07-07: 20 [IU] via SUBCUTANEOUS
  Filled 2024-07-06: qty 0.2

## 2024-07-06 MED ORDER — ENOXAPARIN SODIUM 40 MG/0.4ML IJ SOSY
40.0000 mg | PREFILLED_SYRINGE | Freq: Every day | INTRAMUSCULAR | Status: DC
  Administered 2024-07-06 – 2024-07-07 (×2): 40 mg via SUBCUTANEOUS
  Filled 2024-07-06 (×2): qty 0.4

## 2024-07-06 MED ORDER — METOCLOPRAMIDE HCL 5 MG/ML IJ SOLN
10.0000 mg | Freq: Three times a day (TID) | INTRAMUSCULAR | Status: DC
  Administered 2024-07-06 – 2024-07-07 (×3): 10 mg via INTRAVENOUS
  Filled 2024-07-06 (×3): qty 2

## 2024-07-06 NOTE — Plan of Care (Signed)
  Problem: Education: Goal: Knowledge of General Education information will improve Description: Including pain rating scale, medication(s)/side effects and non-pharmacologic comfort measures Outcome: Progressing   Problem: Health Behavior/Discharge Planning: Goal: Ability to manage health-related needs will improve Outcome: Progressing   Problem: Clinical Measurements: Goal: Ability to maintain clinical measurements within normal limits will improve Outcome: Progressing Goal: Will remain free from infection Outcome: Progressing Goal: Diagnostic test results will improve Outcome: Progressing Goal: Respiratory complications will improve Outcome: Progressing Goal: Cardiovascular complication will be avoided Outcome: Progressing   Problem: Activity: Goal: Risk for activity intolerance will decrease Outcome: Progressing   Problem: Nutrition: Goal: Adequate nutrition will be maintained Outcome: Progressing   Problem: Coping: Goal: Level of anxiety will decrease Outcome: Progressing   Problem: Elimination: Goal: Will not experience complications related to bowel motility Outcome: Completed/Met Goal: Will not experience complications related to urinary retention Outcome: Completed/Met   Problem: Pain Managment: Goal: General experience of comfort will improve and/or be controlled Outcome: Progressing   Problem: Safety: Goal: Ability to remain free from injury will improve Outcome: Progressing   Problem: Skin Integrity: Goal: Risk for impaired skin integrity will decrease Outcome: Progressing

## 2024-07-06 NOTE — Progress Notes (Addendum)
 PT Cancellation Note  Patient Details Name: Gary Mitchell MRN: [id number] DOB: [birth year]   Cancelled Treatment:    Reason Eval/Treat Not Completed: PT screened, no needs identified, will sign off Per RN, pt is independent in the room. PT to sign off.  Followed up with pt per MD request. Pt got OOB mod I, ambulated in room with RW & without AD; less antalgic gait with RW vs no AD as pt appears to have increased pain in RLE with weight bearing. Discussed PT in hospital & HHPT f/u with pt declining both, reporting he has 2 walkers at home. MD made aware.  Richerd Pinal, PT, DPT 07/06/2024, 12:14 PM   Richerd CHRISTELLA Pinal 07/06/2024, 10:13 AM

## 2024-07-06 NOTE — Progress Notes (Addendum)
 " PROGRESS NOTE  Gary Mitchell  FMW:[id number] DOB: 11-Jun-1971 DOA: 07/01/2024 PCP: Delbert Clam, MD   Brief Narrative: 53 y.o. male with medical history significant of alcoholic hepatitis, A-fib, cirrhosis, type 2 diabetes, seizure who denting to emergency department due to poorly controlled blood sugar and lack of medications.  Also reported of swelling in the right ankle.  Recent x-ray head showed persistent osteomyelitis of tibia/fibula.  ID had recommended Unasyn /daptomycin  but patient left AMA and presented back to the ER.  During this hospitalization currently on IV antibiotics, repeat MRI ankle has been ordered but delayed due to concerns of  bed bugs.  Patient continues to decline amputation or any kind of surgery.  Case discussed with Dr. Harden.  ID consulted for recommendation for antibiotics.  Recommended 4 weeks of oral antibiotics.  Possible discharge tomorrow if improvement in the sodium level.  Assessment & Plan:  Principal Problem:   Osteomyelitis (HCC) Active Problems:   Abscess   Generalized weakness Osteomyelitis of the right ankle -Recently admitted for sepsis secondary to right lower extremity osteomyelitis seen by orthopedic and infectious disease.  Patient declined BKA and opted for IV antibiotic, ID recommended IV daptomycin  and Unasyn  but patient ended up leaving AGAINST MEDICAL ADVICE. Restarted  IV daptomycin  and Unasyn  here on admission  - Patient continues to decline amputation or any kind of surgery.  Case discussed with Dr. Harden.  There is no option besides amputation. -ID consulted for recommendation of oral antibiotic: Recommended Augmentin  and doxycycline  for 4 weeks -PT consulted: No needs identified  Hyponatremia: This is likely from chronic alcohol  abuse and also could be exacerbated to vomiting.  Also contributed by hyperosmolar hyponatremia due to hyperglycemia.  Started on IVF's today.  Continue salt tablets as well.  BMP tomorrow    Bedbugs -  Nursing staff noted like bedbugs.  Appropriate hospital staff has been notified.   History of alcohol  use Transaminitis, alcohol  pattern - Started on CIWA, currently not on withdrawal  Microcytic anemia - Recent iron  studies showed low saturation and low ferritin levels.  Given iron  and made outpatient referral.  No evidence of acute blood loss.  Hemoglobin in the range of 8 today   Diabetes mellitus type 2, uncontrolled due to hyper/hypoglycemia -Recent hemoglobin A1c 6.9.  On long-acting , sliding scale insulin .  Nausea/vomiting New problem, could be from underlying diabetic gastroparesis.  Continue Reglan .  Abdomen is soft and nondistended   History of seizure - Continue Keppra    Essential hypertension -Continue home medication.   GERD -Continue PPI        DVT prophylaxis:enoxaparin  (LOVENOX ) injection 40 mg Start: 07/06/24 1100 SCDs Start: 07/01/24 0445     Code Status: Full Code  Family Communication: None at bedside  Patient status:Inpatient  Patient is from :home  Anticipated discharge un:ynfz  Estimated DC date:tomorrow.  He says he lives with mom   Consultants: Orthopedics,ID  Procedures: None  Antimicrobials:  Anti-infectives (From admission, onward)    Start     Dose/Rate Route Frequency Ordered Stop   07/05/24 2200  doxycycline  (VIBRA -TABS) tablet 100 mg        100 mg Oral Every 12 hours 07/05/24 1931     07/05/24 2200  amoxicillin -clavulanate (AUGMENTIN ) 875-125 MG per tablet 1 tablet        1 tablet Oral Every 12 hours 07/05/24 1931     07/01/24 0800  Ampicillin -Sulbactam (UNASYN ) 3 g in sodium chloride  0.9 % 100 mL IVPB  Status:  Discontinued  3 g 200 mL/hr over 30 Minutes Intravenous Every 6 hours 07/01/24 0456 07/05/24 1931   07/01/24 0130  DAPTOmycin  (CUBICIN ) IVPB 500 mg/50mL premix  Status:  Discontinued        8 mg/kg  63.5 kg 100 mL/hr over 30 Minutes Intravenous Daily 07/01/24 0122 07/05/24 1931   07/01/24 0100   piperacillin -tazobactam (ZOSYN ) IVPB 3.375 g        3.375 g 100 mL/hr over 30 Minutes Intravenous  Once 07/01/24 0050 07/01/24 0155       Subjective: Patient seen and examined at the bedside today.  Hemodynamically stable.  Appears comfortable.  Lying in bed.  Nausea and vomiting better today.  Ate some food this morning.  Denies significant pain in the right ankle.  Objective: Vitals:   07/04/24 2256 07/05/24 0627 07/05/24 2244 07/06/24 0648  BP: 127/84 122/78 118/78 107/70  Pulse: 87 88 83 86  Resp: 16 18 18 14   Temp:  98.8 F (37.1 C) 98.2 F (36.8 C) 98.4 F (36.9 C)  TempSrc: Oral  Oral Oral  SpO2: 100% 99% 100% 99%  Weight:      Height:        Intake/Output Summary (Last 24 hours) at 07/06/2024 1101 Last data filed at 07/06/2024 0900 Gross per 24 hour  Intake --  Output 1925 ml  Net -1925 ml   Filed Weights   07/01/24 0010  Weight: 63.5 kg    Examination:  General exam: Overall comfortable, not in distress HEENT: PERRL Respiratory system:  no wheezes or crackles  Cardiovascular system: S1 & S2 heard, RRR.  Gastrointestinal system: Abdomen is nondistended, soft and nontender. Central nervous system: Alert and oriented Extremities: No edema, no clubbing ,no cyanosis, wound on the right ankle covered with dressing Skin: No rashes, no ulcers,no icterus      Data Reviewed: I have personally reviewed following labs and imaging studies  CBC: Recent Labs  Lab 07/01/24 0104 07/02/24 0324 07/04/24 0847 07/05/24 0327 07/06/24 0824  WBC 8.1 8.3 8.8 12.3* 13.4*  NEUTROABS 4.6  --   --   --   --   HGB 9.0* 7.3* 7.1* 8.1* 8.2*  HCT 28.2* 22.3* 21.9* 24.3* 25.3*  MCV 74.6* 72.6* 73.2* 73.4* 75.3*  PLT 107* 93* 144* 170 176   Basic Metabolic Panel: Recent Labs  Lab 07/01/24 0104 07/02/24 0324 07/03/24 0824 07/04/24 0847 07/05/24 0327 07/06/24 0824  NA 138 136 127* 126* 127* 124*  K 4.6 3.1* 4.6 4.0 4.4 4.2  CL 101 98 95* 96* 96* 94*  CO2 22 26 25   21* 18* 19*  GLUCOSE 297* 43* 174* 205* 190* 283*  BUN 8 7 <5* 7 8 8   CREATININE 0.45* 0.44* 0.47* 0.49* 0.58* 0.61  CALCIUM  8.8* 8.1* 8.5* 8.4* 8.5* 8.2*  MG 1.5* 1.5* 1.4*  --   --   --   PHOS  --  3.2 2.2*  --   --   --      Recent Results (from the past 240 hours)  Culture, blood (Routine X 2) w Reflex to ID Panel     Status: None   Collection Time: 07/01/24  2:35 PM   Specimen: BLOOD RIGHT HAND  Result Value Ref Range Status   Specimen Description   Final    BLOOD RIGHT HAND Performed at [facility], [address] [place], [place] [address]    Special Requests   Final    BOTTLES DRAWN AEROBIC AND ANAEROBIC Blood Culture adequate  volume Performed at [facility], [address] [place], [place] [address]    Culture   Final    NO GROWTH 5 DAYS Performed at [facility], [address], [place], [place] [address]    Report Status 07/06/2024 FINAL  Final  Culture, blood (Routine X 2) w Reflex to ID Panel     Status: None   Collection Time: 07/01/24  2:40 PM   Specimen: BLOOD RIGHT HAND  Result Value Ref Range Status   Specimen Description   Final    BLOOD RIGHT HAND Performed at [facility], [address], [place], [place] [address]    Special Requests   Final    BOTTLES DRAWN AEROBIC AND ANAEROBIC Blood Culture adequate volume Performed at [facility], [address] [place], [place] [address]    Culture   Final    NO GROWTH 5 DAYS Performed at [facility], [address], [place], [place] [address]    Report Status 07/06/2024 FINAL  Final     Radiology Studies: No results found.   Scheduled Meds:  amoxicillin -clavulanate  1 tablet Oral Q12H   doxycycline   100 mg Oral Q12H   enoxaparin  (LOVENOX ) injection  40 mg Subcutaneous Daily   ferrous sulfate   325 mg Oral QPC lunch   folic acid   1 mg Oral Daily   insulin  aspart  0-24 Units Subcutaneous TID WC   insulin   glargine-yfgn  10 Units Subcutaneous Daily   levETIRAcetam   1,000 mg Oral BID   metoCLOPramide  (REGLAN ) injection  10 mg Intravenous Q8H   metoprolol  succinate  12.5 mg Oral Daily   multivitamin with minerals  1 tablet Oral QPC lunch   pantoprazole   40 mg Oral BID   senna-docusate  2 tablet Oral QHS   sodium chloride   1 g Oral TID WC   thiamine   100 mg Oral Daily   Or   thiamine   100 mg Intravenous Daily   Continuous Infusions:  sodium chloride        LOS: 5 days   Ivonne Mustache, MD [facility] P12/30/2025, 11:01 AM  "

## 2024-07-06 NOTE — Plan of Care (Signed)
   Problem: Coping: Goal: Level of anxiety will decrease Outcome: Progressing   Problem: Pain Managment: Goal: General experience of comfort will improve and/or be controlled Outcome: Progressing   Problem: Safety: Goal: Ability to remain free from injury will improve Outcome: Progressing

## 2024-07-07 ENCOUNTER — Other Ambulatory Visit (HOSPITAL_COMMUNITY): Payer: Self-pay

## 2024-07-07 LAB — BASIC METABOLIC PANEL WITH GFR
Anion gap: 7 (ref 5–15)
BUN: 7 mg/dL (ref 6–20)
CO2: 19 mmol/L — ABNORMAL LOW (ref 22–32)
Calcium: 7.7 mg/dL — ABNORMAL LOW (ref 8.9–10.3)
Chloride: 100 mmol/L (ref 98–111)
Creatinine, Ser: 0.46 mg/dL — ABNORMAL LOW (ref 0.61–1.24)
GFR, Estimated: >60 mL/min
Glucose, Bld: 153 mg/dL — ABNORMAL HIGH (ref 70–99)
Potassium: 3.8 mmol/L (ref 3.5–5.1)
Sodium: 125 mmol/L — ABNORMAL LOW (ref 135–145)

## 2024-07-07 LAB — CBC
HCT: 22.1 % — ABNORMAL LOW (ref 39.0–52.0)
Hemoglobin: 7.4 g/dL — ABNORMAL LOW (ref 13.0–17.0)
MCH: 25 pg — ABNORMAL LOW (ref 26.0–34.0)
MCHC: 33.5 g/dL (ref 30.0–36.0)
MCV: 74.7 fL — ABNORMAL LOW (ref 80.0–100.0)
Platelets: 141 K/uL — ABNORMAL LOW (ref 150–400)
RBC: 2.96 MIL/uL — ABNORMAL LOW (ref 4.22–5.81)
RDW: 31.8 % — ABNORMAL HIGH (ref 11.5–15.5)
WBC: 10.8 K/uL — ABNORMAL HIGH (ref 4.0–10.5)
nRBC: 0 % (ref 0.0–0.2)

## 2024-07-07 LAB — GLUCOSE, CAPILLARY
Glucose-Capillary: 140 mg/dL — ABNORMAL HIGH (ref 70–99)
Glucose-Capillary: 276 mg/dL — ABNORMAL HIGH (ref 70–99)

## 2024-07-07 MED ORDER — SODIUM CHLORIDE 1 G PO TABS
2.0000 g | ORAL_TABLET | Freq: Two times a day (BID) | ORAL | 1 refills | Status: DC
  Filled 2024-07-07: qty 120, 30d supply, fill #0

## 2024-07-07 MED ORDER — FOLIC ACID 1 MG PO TABS
1.0000 mg | ORAL_TABLET | Freq: Every day | ORAL | 0 refills | Status: AC
  Filled 2024-07-07: qty 30, 30d supply, fill #0

## 2024-07-07 MED ORDER — AMOXICILLIN-POT CLAVULANATE 875-125 MG PO TABS
1.0000 | ORAL_TABLET | Freq: Two times a day (BID) | ORAL | 0 refills | Status: AC
  Filled 2024-07-07: qty 56, 28d supply, fill #0

## 2024-07-07 MED ORDER — DOXYCYCLINE HYCLATE 100 MG PO TABS
100.0000 mg | ORAL_TABLET | Freq: Two times a day (BID) | ORAL | 0 refills | Status: AC
  Filled 2024-07-07: qty 56, 28d supply, fill #0

## 2024-07-07 MED ORDER — ACETAMINOPHEN 325 MG PO TABS: 650.0000 mg | ORAL_TABLET | Freq: Four times a day (QID) | ORAL | Status: AC | PRN

## 2024-07-07 NOTE — Plan of Care (Signed)
" °  Problem: Education: Goal: Knowledge of General Education information will improve Description: Including pain rating scale, medication(s)/side effects and non-pharmacologic comfort measures Outcome: Adequate for Discharge   Problem: Health Behavior/Discharge Planning: Goal: Ability to manage health-related needs will improve Outcome: Adequate for Discharge   Problem: Clinical Measurements: Goal: Ability to maintain clinical measurements within normal limits will improve Outcome: Progressing Goal: Will remain free from infection Outcome: Progressing Goal: Diagnostic test results will improve Outcome: Progressing Goal: Respiratory complications will improve Outcome: Adequate for Discharge Goal: Cardiovascular complication will be avoided Outcome: Adequate for Discharge   Problem: Activity: Goal: Risk for activity intolerance will decrease Outcome: Adequate for Discharge   Problem: Nutrition: Goal: Adequate nutrition will be maintained Outcome: Completed/Met   Problem: Coping: Goal: Level of anxiety will decrease Outcome: Progressing   Problem: Pain Managment: Goal: General experience of comfort will improve and/or be controlled Outcome: Progressing   Problem: Safety: Goal: Ability to remain free from injury will improve Outcome: Progressing   Problem: Skin Integrity: Goal: Risk for impaired skin integrity will decrease Outcome: Progressing   "

## 2024-07-07 NOTE — TOC Transition Note (Addendum)
 Transition of Care [facility]) - Discharge Note   Patient Details  Name: Gary Mitchell MRN: [id number] Date of Birth: June 18, 1971  [facility] CM/SW Contact:  NORMAN ASPEN, LCSW Phone Number: 07/07/2024, 12:21 PM   Clinical Narrative:     Pt medically cleared for dc home today.  Have confirmed with pt that he will return to address on record and that he is able to arrange his own transportation.  We discussed possible HHRN referral, however, pt has declined this.   I am not able to get supplies for patient as he has declined any HHRN visits and, actually, very doubtful that any agency would accept with this Medicaid. He is followed at [facility] and I have tried to get an appointment set up but they are working on trying to get appointment within the next two weeks - they will contact him directly with appointment info. RN will give him some dressing supplies to take home with plan for him to follow up with PCP.  Community resources added to AVS.  No further IP CM needs.  Final next level of care: Home/Self Care Barriers to Discharge: Barriers Resolved   Patient Goals and CMS Choice Patient states their goals for this hospitalization and ongoing recovery are:: return home          Discharge Placement                       Discharge Plan and Services Additional resources added to the After Visit Summary for                  DME Arranged: N/A DME Agency: NA                  Social Drivers of Health (SDOH) Interventions SDOH Screenings   Food Insecurity: Food Insecurity Present (07/01/2024)  Housing: High Risk (07/01/2024)  Transportation Needs: Unmet Transportation Needs (07/01/2024)  Utilities: Patient Declined (07/01/2024)  Alcohol  Screen: Low Risk (05/13/2023)  Depression (PHQ2-9): High Risk (03/22/2024)  Financial Resource Strain: High Risk (04/11/2023)  Physical Activity: Patient Declined (05/13/2023)  Social Connections: Socially  Isolated (07/01/2024)  Stress: No Stress Concern Present (05/13/2023)  Tobacco Use: Medium Risk (07/01/2024)  Health Literacy: Adequate Health Literacy (05/13/2023)     Readmission Risk Interventions    07/07/2024   12:20 PM 03/04/2024   12:26 PM 01/06/2024    9:34 AM  Readmission Risk Prevention Plan  Transportation Screening Complete Complete Complete  PCP or Specialist Appt within 5-7 Days   Complete  Home Care Screening   Complete  Medication Review (RN CM)   Complete  HRI or Home Care Consult  Complete   Social Work Consult for Recovery Care Planning/Counseling  Complete   Palliative Care Screening  Not Applicable   Medication Review [employer]) Complete Complete   HRI or Home Care Consult Complete    SW Recovery Care/Counseling Consult Complete    Palliative Care Screening Not Applicable    Skilled Nursing Facility Not Applicable

## 2024-07-07 NOTE — Progress Notes (Signed)
 Upon entering room, it was noted that the patient had removed the dressings to his RIGHT ankle and was picking at the swollen, open area.  Encouraged the patient to not pick at the wound, explaining that it could expose his ankle to further infection and compromise the potential for it to heal. The patient didn't acknowledge anything that was said and continued picking at the wound.  No new open areas were noted, no bleeding or drainage observed.

## 2024-07-07 NOTE — Progress Notes (Signed)
 Pt discharge medications delivered to pt at bedside.

## 2024-07-07 NOTE — Discharge Summary (Signed)
 " Physician Discharge Summary   Patient: Gary Mitchell MRN: [id number] DOB: April 09, 1971  Admit date:     07/01/2024  Discharge date: 07/07/2024  Discharge Physician: Isaiah Lever ANP   PCP: Delbert Clam, MD   Recommendations at discharge:   Patient will continue basic wound care after discharge.  Patient has been instructed to leave dressings in place after applied.  He has been observed picking at his wound after self removal of dressing and has been instructed by nursing staff as well to not do this.  He has refused home health services and basic wound care supplies were given to the patient prior to discharge Recommended wound care as follows:Cleanse wounds with Vashe [name] [phone]), pat dry. Cover wounds with single layer of xeroform and top with foam, change every other day  Patient will need a repeat hematocrit and hemoglobin in 3 to 5 days after discharge.  Hemoglobin on date of discharge was 7.4.  Patient tolerating well and was normotensive without any symptoms such as chest pain, shortness of breath or dizziness  Discharge Diagnoses: Principal Problem: Chronic osteomyelitis (HCC) right ankle   Hospital Course: Brief Narrative:  53 y.o. male with medical history significant of alcoholic hepatitis, A-fib, cirrhosis, type 2 diabetes, seizure who denting to emergency department due to poorly controlled blood sugar and lack of medications.  Also reported of swelling in the right ankle.  Recent x-ray head showed persistent osteomyelitis of tibia/fibula.  ID had recommended Unasyn /daptomycin  but patient left AMA and presented back to the ER.  During this hospitalization currently on IV antibiotics, repeat MRI ankle has been ordered but delayed due to concerns of  bed bugs. Discussed with orthopedic Dr Jerri, if patient agreeable for BKA, consult Dr. Harden on Monday  Assessment & Plan: Generalized weakness Osteomyelitis of the right ankle -Recently admitted for sepsis secondary to  right lower extremity osteomyelitis seen by orthopedic and infectious disease.  During that admission the patient declined BKA and opted for IV antibiotic, ID recommended IV daptomycin  and Unasyn  but patient ended up leaving AGAINST MEDICAL ADVICE.  Returned to the ED and was readmitted and was restarted  IV daptomycin  and Unasyn  here on admission  - Patient continued to decline amputation or any kind of surgery.  Case discussed with Dr. Harden.  There is no option besides amputation. -ID consulted for recommendation of oral antibiotic: Recommended Augmentin  and doxycycline  for 4 weeks -Wound care recommendations as follows:Cleanse wounds with Vashe [name] [phone]), pat dry. Cover wounds with single layer of xeroform and top with foam, change every other day -patient also refusing home health nursing/services so was given basic wound care supplies upon discharge. -PT consulted: No needs identified  Hypomagnesemia - Corrected with repletion  Bedbugs - Nursing staff observed bedbugs.  -- On contact isolation during hospitalization  History of alcohol  use Transaminitis, alcohol  pattern -Alcohol  withdrawal protocol with Ativan  -- No withdrawal symptoms noted  Microcytic anemia - Recent iron  studies showed low saturation and low ferritin levels.  --Was given IV iron  during this admission -- Recommend repeat hemoglobin in 3 to 5 days after discharge.  TOC has attempted to procure appointment for patient at the [facility] with his primary physician.  The clinic will call patient with follow-up appointment  Diabetes mellitus type 2, uncontrolled due to hyper/hypoglycemia -Recent hemoglobin A1c 6.9.   -- Resume metformin  -- Continue Lantus  and Jardiance   History of seizure - Continue Keppra   Essential hypertension -Continue metoprolol   GERD -PPI  DVT prophylaxis: Lovenox     Code Status: Full Code Family Communication:   Ongoing management for right ankle  osteomyelitis     Subjective: Patient was sleeping but did interact with this clinical research associate.  He reports that he may need some of his prescriptions filled since he must send his usual prescriptions off to a pharmacy in [place] .  Appropriate prescriptions as discussed with the patient sent to pharmacy.  Denied any significant pain.  Verbalized understanding of wound care and wound management instructions.  Examination:  General exam: Appears calm and comfortable  Respiratory system: Clear to auscultation. Respiratory effort normal. Cardiovascular system: S1 & S2 heard, RRR. No JVD, murmurs, rubs, gallops or clicks. No pedal edema. Gastrointestinal system: Abdomen is nondistended, soft and nontender. No organomegaly or masses felt. Normal bowel sounds heard. Central nervous system: Alert and oriented. No focal neurological deficits. Extremities: Symmetric 5 x 5 power. Skin: Right ankle swelling-wound covered with dressing Psychiatry: Judgement and insight appear normal. Mood & affect appropriate.         Consultants: Orthopedics, infectious disease Procedures performed: None Disposition: Home Diet recommendation:  Carb modified diet DISCHARGE MEDICATION: Allergies as of 07/07/2024       Reactions   Iohexol  Other (See Comments)   Unknown reaction at 23 days old Mom at bedside reported patient was given injections of iohexol  in foot and resulted in hole in foot or foot got infected   Nsaids Other (See Comments)   Patient said he was told he could take Tylenol  ONLY.        Medication List     STOP taking these medications    atorvastatin  20 MG tablet Commonly known as: LIPITOR       TAKE these medications    acetaminophen  325 MG tablet Commonly known as: TYLENOL  Take 2 tablets (650 mg total) by mouth every 6 (six) hours as needed for mild pain (pain score 1-3) or fever (or Fever >/= 101). What changed:  medication strength how much to take when to take this reasons  to take this   amoxicillin -clavulanate 875-125 MG tablet Commonly known as: AUGMENTIN  Take 1 tablet by mouth every 12 (twelve) hours.   doxycycline  100 MG tablet Commonly known as: VIBRA -TABS Take 1 tablet (100 mg total) by mouth every 12 (twelve) hours.   folic acid  1 MG tablet Commonly known as: FOLVITE  Take 1 tablet (1 mg total) by mouth daily. What changed: See the new instructions.   FreeStyle Libre 3 Plus Sensor Misc Change sensor every 15 days. Use to check blood sugar continuously.   insulin  lispro 100 UNIT/ML KwikPen Commonly known as: HumaLOG  KwikPen Inject 10 Units into the skin 3 (three) times daily.   Jardiance  10 MG Tabs tablet Generic drug: empagliflozin  TAKE 1 TABLET BY MOUTH ONCE DAILY BEFORE BREAKFAST *NEW PRESCRIPTION REQUEST*   Lantus  SoloStar 100 UNIT/ML Solostar Pen Generic drug: insulin  glargine INJECT 32 UNITS SUBCUTANEOUSLY ONCE DAILY *NEW PRESCRIPTION REQUEST*   levETIRAcetam  1000 MG tablet Commonly known as: KEPPRA  TAKE 1 TABLET(S) BY MOUTH 2 TIMES PER DAY *NEW PRESCRIPTION REQUEST*   metFORMIN  500 MG tablet Commonly known as: GLUCOPHAGE  TAKE 2 TABLET(S) BY MOUTH 2 TIMES PER DAY *NEW PRESCRIPTION REQUEST*   metoprolol  succinate 25 MG 24 hr tablet Commonly known as: TOPROL -XL TAKE 1/2 (ONE-HALF) TABLET BY MOUTH ONCE DAILY *NEW PRESCRIPTION REQUEST*   pantoprazole  40 MG tablet Commonly known as: PROTONIX  TAKE 1 TABLET(S) BY MOUTH 2 TIMES PER DAY *NEW PRESCRIPTION REQUEST*   Pen Needles 32G X  4 MM Misc Use to inject insulin  once daily.   thiamine  100 MG tablet Commonly known as: VITAMIN B1 Take 1 tablet (100 mg total) by mouth daily.               Discharge Care Instructions  (From admission, onward)           Start     Ordered   07/07/24 0000  Discharge wound care:       Comments: Cleanse wounds with Vashe [name] [phone]), pat dry.  Cover wounds with single layer of xeroform and top with foam, change every other day  (right ankle)   07/07/24 1250            Follow-up Information     Delbert Clam, MD. Call.   Specialty: Family Medicine Why: call on Friday to see if they have been able to get appointment made with your MD Contact information: [address] [place] [address] [phone]                  Condition at discharge: stable  The results of significant diagnostics from this hospitalization (including imaging, microbiology, ancillary and laboratory) are listed below for reference.   Imaging Studies: MR ANKLE RIGHT W WO CONTRAST Result Date: 07/03/2024 CLINICAL DATA:  Osteomyelitis.  Swelling. EXAM: MRI OF THE RIGHT ANKLE WITHOUT AND WITH CONTRAST TECHNIQUE: Multiplanar, multisequence MR imaging of the ankle was performed before and after the administration of intravenous contrast. CONTRAST:  6mL GADAVIST  GADOBUTROL  1 MMOL/ML IV SOLN COMPARISON:  Right ankle radiographs dated 07/01/2024. MRI of the right ankle dated 06/24/2024. FINDINGS: Bones/Joints: Chronic posttraumatic ankle deformity with similar approximately 1 cm of lateral displacement of the talus relative to the tibial plafond and destructive changes/resorption of the talar dome and tibial plafond. Redemonstrated septic arthritis of the tibiotalar joint with osteomyelitis of the distal tibia, distal fibula, and talus. Two screws again noted traversing a persistent medial malleolar fracture. Increased size of a complex tibiotalar joint effusion with synovitis. As before, the joint effusion extends into the fracture plane of the distal fibula with similar findings concerning for lateral draining sinus tract from the suspected septic joint to the skin surface. Soft Tissue: Increased size of a now 1.9 x 1.1 cm heterogenous subcutaneous fluid collection with peripheral enhancement medial to the proximal margins of the medial malleolar screws, concerning for infection. Tendons: Peroneal: Peroneal longus tendon  intact. Peroneal brevis intact. Posteromedial: Redemonstrated tear of the posterior tibialis tendon near the level of the medial malleolus. Similar enhancement of the flexor hallucis longus tendon sheath, infectious tenosynovitis can not be excluded. Flexor digitorum longus tendon intact. Anterior: Tibialis anterior tendon intact. Extensor hallucis longus tendon intact Extensor digitorum longus tendon intact. Achilles:  Intact with similar mild distal Achilles tendinopathy. Plantar Fascia: Intact. Ligaments: Lateral: Intact. Disruption of the distal tibiofibular syndesmotic ligaments, unchanged. Medial: Intact. IMPRESSION: IMPRESSION 1. Redemonstrated septic arthritis of the tibiotalar joint with osteomyelitis of the distal tibia, distal fibula, and talus. Chronic posttraumatic ankle deformity with similar approximately 1 cm of lateral displacement of the talus relative to the tibial plafond and destructive changes/resorption of the talar dome and tibial plafond. 2. Increased size of a complex tibiotalar joint effusion with synovitis. As before, the joint effusion extends into the fracture plane of the distal fibula with similar findings concerning for lateral draining sinus tract from the suspected septic joint to the skin surface. 3. Increased size of a now 1.9 x 1.1 cm subcutaneous  complex fluid collection medial to the proximal margins of the medial malleolar screws, concerning for infection. 4. Similar enhancement of the flexor hallucis longus tendon sheath, infectious tenosynovitis can not be excluded. 5. Redemonstrated tear of the posterior tibialis tendon near the medial malleolus. 6. Redemonstrated disruption of the distal tibiofibular syndesmotic ligaments. Electronically Signed   By: Harrietta Sherry M.D.   On: 07/03/2024 14:55   DG Ankle 2 Views Right Result Date: 07/01/2024 EXAM: 2 VIEW(S) XRAY OF THE RIGHT ANKLE 07/01/2024 02:24:00 AM CLINICAL HISTORY: osteomyelitis COMPARISON: Comparison with  06/23/2024 and MRI 06/24/2024. FINDINGS: BONES AND JOINTS: Persistent osteomyelitis involving the distal tibia, fibula, and medial malleolus with involvement of the talar dome. Periprosthetic lucencies compatible with osteomyelitic involvement. Similar lateral dislocation of the talus in relation to the tibia with widening of the medial mortise. SOFT TISSUES: Extensive soft tissue swelling about the ankle. IMPRESSION: 1. Persistent osteomyelitis involving the distal tibia, fibula, medial malleolus, and talar dome. 2. Similar lateral dislocation of the talus relative to the tibia with widening of the medial mortise and extensive soft tissue swelling about the ankle. Electronically signed by: Norman Gatlin MD 07/01/2024 02:29 AM EST RP Workstation: [id number]   MR ANKLE RIGHT W WO CONTRAST Result Date: 06/25/2024 EXAM: MRI of the right Ankle with and without contrast. 06/24/2024 05:47:00 AM TECHNIQUE: Multiplanar multisequence MRI of the right ankle was performed with and without the administration of 7 mL of gadobutrol  (GADAVIST ) 1 MMOL/ML intravenous contrast. COMPARISON: None available. CLINICAL HISTORY: Osteomyelitis, ankle. FINDINGS: SYNDESMOTIC LIGAMENTS: Intact anterior inferior tibiofibular ligament and posterior inferior tibiofibular ligament. No significant periligamentous edema or interval widening. LATERAL COLLATERAL LIGAMENT COMPLEX: Intact anterior talofibular ligament, posterior talofibular ligament and calcaneofibular ligament. DELTOID LIGAMENT COMPLEX: Intact superficial and deep components. SINUS TARSI AND SPRING LIGAMENT: Small effusion of the posterior subtalar joint anteromedially with edema in the sinus tali. Visualized portions of the spring ligament are intact. MEDIAL TENDONS: Deficient tibialis posterior tendon is likely torn in the vicinity of the medial malleolus. Flexor hallucis longus tendon sheath enhancement, for example on image 21 series 8, cannot exclude infectious tenosynovitis.  Intact flexor digitorum longus tendon. LATERAL TENDONS: Intact peroneus brevis and longus tendons. ANTERIOR TENDONS: The tibialis anterior, extensor hallucis longus and extensor digitorum longus tendons are normal in position, morphology and signal. ACHILLES TENDON: Mild distal achilles tendinopathy. PLANTAR FASCIA: The medial and lateral bundles of the plantar fascia are normal in morphology and signal. There is no acute plantar fasciitis or tear. No plantar fascial nodules. TARSAL TUNNEL: Flexor hallucis longus tendon sheath enhancement, for example on image 21 series 8, cannot exclude infectious tenosynovitis. BONE MARROW: Previous fracture or dislocation at the ankle with extensive residual deformity. Bony resorption of portions of the talar dome and tibial plafond with surrounding marrow enhancement compatible with active osteomyelitis. 2 lag screws in the medial malleolus traverse the persistent fracture, with lucency around the screws on conventional radiography favoring infection. The joint effusion extends into the fracture plane of the distal fibula with surrounding distal fibular enhancement compatible with osteomyelitis. Appearance compatible with septic tibiotalar joint with distal tibial, distal fibular, and talar osteomyelitis. Substantial marrow edema is present in the dorsum of the ankle and foot. JOINT SPACES: Previous fracture or dislocation at the ankle with extensive residual deformity, approximately 1 cm of lateral displacement of the talus with respect to the tibial shaft. Complex tibiotalar joint effusion with severe synovitis suggesting septic joint. Small effusion of the posterior subtalar joint anteromedially. SOFT TISSUES: Cellulitis not excluded.  Cannot exclude lateral draining sinus tract from the suspected septic joint. Small subcutaneous fluid collection with enhancing margin medial to the proximal margins of the medial malleolar screws shown on image 14 series 8, consistent with  peri-malleolar infection. IMPRESSION: 1. Septic tibiotalar arthritis with osteomyelitis involving the distal tibia, distal fibula, and talus. 2. Subcutaneous fluid collection medial to the medial malleolar screws, compatible with infection. 3. Possible lateral sinus tract extending from the tibiotalar joint to the cutaneous surface. Correlate with any visible drainage. 4. Flexor hallucis longus tendon sheath enhancement with possible infectious tenosynovitis. 5. Small posterior subtalar joint effusion anteromedially with mild sinus tarsi edema. 6. Chronic posttraumatic ankle deformity with persistent fractures and medial malleolar screw fixation. 7. Tibialis posterior tendon tear near the medial malleolus. 8. Mild distal achilles tendinopathy. Electronically signed by: Ryan Salvage MD 06/25/2024 09:12 AM EST RP Workstation: [id number]   DG Ankle Complete Right Result Date: 06/23/2024 EXAM: 3 OR MORE VIEW(S) XRAY OF THE ANKLE 06/23/2024 11:43:13 PM CLINICAL HISTORY: Chronic osteo, worsening pain, sepsis COMPARISON: 04/23/2024 FINDINGS: BONES AND JOINTS: Stable ORIF of medial malleolus. Extensive bony destruction of distal tibia and fibula. Fragmentation of medial malleolus similar to previous exam. Increasing periprosthetic lucencies surrounding the lag screws within the medial malleolus. Displacement of the retaining button along the medial cortex of the distal tibia in keeping with disruption of the type of reconstruction. Increasing erosion along the fracture margins, within the talar dome, and within the distal tibia and medial malleolar fracture fragments in keeping with progressive changes of osteomyelitis. Osseous structures are diffusely osteopenic in keeping with disease. Stable lateral dislocation of the talar dome in relation to the tibial plafond. Stable lateral subluxation at tibiotalar articulation. SOFT TISSUES: Extensive surrounding soft tissue swelling. Soft tissue wound now seen with  superficial ulceration lateral tibia distal tibial callus. IMPRESSION: 1. Progressive osteomyelitis involving the distal tibia, fibula, and medial malleolus, with involvement of the talar dome. Progressive periprosthetic erosion. Persistent changes in keeping with disruption of the syndesmotic reconstruction. 2. Stable lateral dislocation of the talar dome relative to the tibial plafond with stable lateral subluxation at the tibiotalar articulation. 3. Extensive surrounding soft tissue swelling with a lateral soft tissue wound and superficial ulceration adjacent to the distal tibial callus. 4. Diffuse osteopenia. Electronically signed by: Dorethia Molt MD 06/23/2024 11:55 PM EST RP Workstation: [id number]   DG Chest Port 1 View Result Date: 06/23/2024 EXAM: 1 VIEW(S) XRAY OF THE CHEST 06/23/2024 11:37:00 PM COMPARISON: 02/28/2024 CLINICAL HISTORY: SOB (shortness of breath) FINDINGS: LUNGS AND PLEURA: No focal pulmonary opacity. No pleural effusion. No pneumothorax. HEART AND MEDIASTINUM: No acute abnormality of the cardiac and mediastinal silhouettes. BONES AND SOFT TISSUES: No acute osseous abnormality. IMPRESSION: 1. No acute cardiopulmonary abnormality. Electronically signed by: Dorethia Molt MD 06/23/2024 11:48 PM EST RP Workstation: [id number]    Microbiology: Results for orders placed or performed during the hospital encounter of 07/01/24  Culture, blood (Routine X 2) w Reflex to ID Panel     Status: None   Collection Time: 07/01/24  2:35 PM   Specimen: BLOOD RIGHT HAND  Result Value Ref Range Status   Specimen Description   Final    BLOOD RIGHT HAND Performed at [facility], [address], [place], [place] [address]    Special Requests   Final    BOTTLES DRAWN AEROBIC AND ANAEROBIC Blood Culture adequate volume Performed at [facility], [address], [place], [place] [address]    Culture  Final    NO GROWTH 5 DAYS Performed at [facility], [address], [place], [place] [address]    Report Status 07/06/2024 FINAL  Final  Culture, blood (Routine X 2) w Reflex to ID Panel     Status: None   Collection Time: 07/01/24  2:40 PM   Specimen: BLOOD RIGHT HAND  Result Value Ref Range Status   Specimen Description   Final    BLOOD RIGHT HAND Performed at [facility], [address] [place], [place] [address]    Special Requests   Final    BOTTLES DRAWN AEROBIC AND ANAEROBIC Blood Culture adequate volume Performed at [facility], [address] [place], [place] [address]    Culture   Final    NO GROWTH 5 DAYS Performed at [facility], [address], [place], [place] [address]    Report Status 07/06/2024 FINAL  Final    Labs: CBC: Recent Labs  Lab 07/01/24 0104 07/02/24 0324 07/04/24 0847 07/05/24 0327 07/06/24 0824 07/07/24 0324  WBC 8.1 8.3 8.8 12.3* 13.4* 10.8*  NEUTROABS 4.6  --   --   --   --   --   HGB 9.0* 7.3* 7.1* 8.1* 8.2* 7.4*  HCT 28.2* 22.3* 21.9* 24.3* 25.3* 22.1*  MCV 74.6* 72.6* 73.2* 73.4* 75.3* 74.7*  PLT 107* 93* 144* 170 176 141*   Basic Metabolic Panel: Recent Labs  Lab 07/01/24 0104 07/02/24 0324 07/03/24 0824 07/04/24 0847 07/05/24 0327 07/06/24 0824 07/07/24 0324  NA 138 136 127* 126* 127* 124* 125*  K 4.6 3.1* 4.6 4.0 4.4 4.2 3.8  CL 101 98 95* 96* 96* 94* 100  CO2 22 26 25  21* 18* 19* 19*  GLUCOSE 297* 43* 174* 205* 190* 283* 153*  BUN 8 7 <5* 7 8 8 7   CREATININE 0.45* 0.44* 0.47* 0.49* 0.58* 0.61 0.46*  CALCIUM  8.8* 8.1* 8.5* 8.4* 8.5* 8.2* 7.7*  MG 1.5* 1.5* 1.4*  --   --   --   --   PHOS  --  3.2 2.2*  --   --   --   --    Liver Function Tests: Recent Labs  Lab 07/01/24 0104  AST 143*  ALT 45*  ALKPHOS 498*  BILITOT 3.2*  PROT 9.8*  ALBUMIN 3.3*   CBG: Recent Labs  Lab 07/06/24 1144 07/06/24 1700 07/06/24 2312 07/07/24 0726 07/07/24 1229  GLUCAP 89 110* 224* 140* 276*    Discharge time spent:  greater than 30 minutes.  Signed: Isaiah Lever, NP [facility] 07/07/2024 "

## 2024-07-09 ENCOUNTER — Telehealth: Payer: Self-pay

## 2024-07-09 ENCOUNTER — Telehealth: Payer: Self-pay | Admitting: Family Medicine

## 2024-07-09 NOTE — Telephone Encounter (Signed)
 Copied from CRM #[id number]. Topic: Appointments - Scheduling Inquiry for Clinic >> Jul 07, 2024 12:13 PM Winona R wrote:  Pt will be discharged today 12/31 and has a pretty bad wound that needs to be followed up on sooner than later.  Please contact the pt for scheduling

## 2024-07-09 NOTE — Transitions of Care (Post Inpatient/ED Visit) (Signed)
" ° °  07/09/2024  Name: Gary Mitchell MRN: [id number] DOB: 05/18/1971  Today's TOC FU Call Status: Today's TOC FU Call Status:: Unsuccessful Call (1st Attempt) Unsuccessful Call (1st Attempt) Date: 07/09/24  Attempted to reach the patient regarding the most recent Inpatient/ED visit.  Follow Up Plan: Additional outreach attempts will be made to reach the patient to complete the Transitions of Care (Post Inpatient/ED visit) call.   Shona Prow RN, CCM [place]  VBCI-Population Health RN Care Manager [phone]  "

## 2024-07-09 NOTE — Telephone Encounter (Signed)
 Contacted patient, unable to reach. Voicemail could not be left due to mailbox being full.

## 2024-07-12 ENCOUNTER — Other Ambulatory Visit (HOSPITAL_COMMUNITY): Payer: Self-pay

## 2024-07-12 ENCOUNTER — Telehealth: Payer: Self-pay

## 2024-07-12 NOTE — Transitions of Care (Post Inpatient/ED Visit) (Signed)
 "  07/12/2024  Name: Gary Mitchell MRN: [id number] DOB: [birth year]  Today's TOC FU Call Status: Today's TOC FU Call Status:: Successful TOC FU Call Completed TOC FU Call Complete Date: 07/12/24  Patient's Name and Date of Birth confirmed. DOB, Name  Situation/Background: From DC Summary.  54 y.o. male with medical history significant of alcoholic hepatitis, A-fib, cirrhosis, type 2 diabetes, seizure who denting to emergency department due to poorly controlled blood sugar and lack of medications.  Also reported of swelling in the right ankle.  Recent x-ray head showed persistent osteomyelitis of tibia/fibula.  ID had recommended Unasyn /daptomycin  but patient left AMA and presented back to the ER.  During this hospitalization currently on IV antibiotics, repeat MRI ankle has been ordered but delayed due to concerns of  bed bugs. Discussed with orthopedic Dr Jerri, if patient agreeable for BKA, consult Dr. Harden on Monday (was unable to make this due to transportation requirements and holiday).   Transition Care Management Follow-up Telephone Call Date of Discharge: 07/07/24 Discharge Facility: [facility]) Type of Discharge: Inpatient Admission Primary Inpatient Discharge Diagnosis:: Chronic Osteomyelitis Right Ankle How have you been since you were released from the hospital?: Same Any questions or concerns?: Yes Patient Questions/Concerns:: Patient reports that he does not want to see Dr Harden and was advised to ask practice for another provider or a referral as well as to ask PCP for another referral when me makes his HFU appointment. Patient Questions/Concerns Addressed: Other: (Patient reports that he does not want to see Dr Harden and was advised to ask practice for another provider or a referral as well as to ask PCP for another referral when me makes his HFU appointment.)  Items Reviewed: Did you receive and understand the discharge instructions provided?: Yes Medications  obtained,verified, and reconciled?: Yes (Medications Reviewed) Any new allergies since your discharge?: No Dietary orders reviewed?: NA Do you have support at home?: No  Medications Reviewed Today: Medications Reviewed Today     Reviewed by Carolee Heron NOVAK, RN (Case Manager) on 07/12/24 at 1727  Med List Status: <None>   Medication Order Taking? Sig Documenting Provider Last Dose Status Informant  acetaminophen  (TYLENOL ) 325 MG tablet [id number] Yes Take 2 tablets (650 mg total) by mouth every 6 (six) hours as needed for mild pain (pain score 1-3) or fever (or Fever >/= 101). Danton Reyes DASEN, MD  Active   amoxicillin -clavulanate (AUGMENTIN ) 875-125 MG tablet [id number] Yes Take 1 tablet by mouth every 12 (twelve) hours. Alto Isaiah CROME, NP  Active   Continuous Glucose Sensor (FREESTYLE LIBRE 3 PLUS SENSOR) [place] [id number] Yes Change sensor every 15 days. Use to check blood sugar continuously. Newlin, Enobong, MD  Active Self, Pharmacy Records  doxycycline  (VIBRA -TABS) 100 MG tablet [id number] Yes Take 1 tablet (100 mg total) by mouth every 12 (twelve) hours. Alto Isaiah CROME, NP  Active   empagliflozin  (JARDIANCE ) 10 MG TABS tablet [id number] Yes TAKE 1 TABLET BY MOUTH ONCE DAILY BEFORE BREAKFAST *NEW PRESCRIPTION REQUESTDEWAINE Delbert Clam, MD  Active Self, Pharmacy Records  folic acid  (FOLVITE ) 1 MG tablet [id number] Yes Take 1 tablet (1 mg total) by mouth daily. Alto Isaiah CROME, NP  Active   insulin  glargine (LANTUS  SOLOSTAR) 100 UNIT/ML Solostar Pen [id number] Yes INJECT 32 UNITS SUBCUTANEOUSLY ONCE DAILY *NEW PRESCRIPTION REQUEST* Newlin, Enobong, MD  Active Self, Pharmacy Records  insulin  lispro (HUMALOG  KWIKPEN) 100 UNIT/ML KwikPen [id number] Yes Inject 10 Units into the skin 3 (three) times daily. Newlin, Enobong, MD  Active Self, Pharmacy Records  Insulin  Pen Needle (PEN NEEDLES) 32G X 4 MM MISC [id number] Yes Use to inject insulin  once daily. Newlin, Enobong, MD  Active Self, Pharmacy Records   levETIRAcetam  (KEPPRA ) 1000 MG tablet [id number] Yes TAKE 1 TABLET(S) BY MOUTH 2 TIMES PER DAY *NEW PRESCRIPTION REQUEST* Newlin, Enobong, MD  Active Self, Pharmacy Records  metFORMIN  (GLUCOPHAGE ) 500 MG tablet [id number] Yes TAKE 2 TABLET(S) BY MOUTH 2 TIMES PER DAY *NEW PRESCRIPTION REQUEST* Newlin, Enobong, MD  Active Self, Pharmacy Records  metoprolol  succinate (TOPROL -XL) 25 MG 24 hr tablet [id number] Yes TAKE 1/2 (ONE-HALF) TABLET BY MOUTH ONCE DAILY *NEW PRESCRIPTION REQUEST* Newlin, Enobong, MD  Active Self, Pharmacy Records  pantoprazole  (PROTONIX ) 40 MG tablet [id number] Yes TAKE 1 TABLET(S) BY MOUTH 2 TIMES PER DAY *NEW PRESCRIPTION REQUEST* Newlin, Enobong, MD  Active Self, Pharmacy Records  thiamine  (VITAMIN B1) 100 MG tablet [id number] Yes Take 1 tablet (100 mg total) by mouth daily. Delbert Clam, MD  Active Self, Pharmacy Records            Home Care and Equipment/Supplies: Were Home Health Services Ordered?:  (Patient Declined home health via [facility] prior to discharge.) Any new equipment or medical supplies ordered?: No (Has a Rolling walker at home.)  Functional Questionnaire: Do you need assistance with bathing/showering or dressing?: No (Patient states he could use help but no one at home till 5 pm do he does the best he can and declined home health on discharge.) Do you need assistance with meal preparation?: No Do you need assistance with eating?: No Do you have difficulty maintaining continence: No Do you need assistance with getting out of bed/getting out of a chair/moving?: No (Patient states he could use help but no one at home till 5 pm do he does the best he can.) Do you have difficulty managing or taking your medications?: No  Follow up appointments reviewed: PCP Follow-up appointment confirmed?: No MD Provider Line Number:[phone] Given: No Specialist Hospital Follow-up appointment confirmed?: No Reason Specialist Follow-Up Not Confirmed: Patient has  Specialist Provider Number and will Call for Appointment (Wants to see a different ortho surgeon for amputation issues.) Do you need transportation to your follow-up appointment?: No (Medical transport) Do you understand care options if your condition(s) worsen?: Yes-patient verbalized understanding  SDOH Interventions Today    Flowsheet Row Most Recent Value  SDOH Interventions   Food Insecurity Interventions Community Resources Provided, Inpatient TOC  Housing Interventions Intervention Not Indicated  Transportation Interventions Payor Benefit, SCAT (Specialized Community Area Transporation), Patient Resources (Friends/Family), Inpatient TOC  Utilities Interventions Intervention Not Indicated, Other (Comment)  [living with his mother at this time]  Health Literacy Interventions Intervention Not Indicated   07/12/2024 TOC RN CM was able to reach patient by telephone.  Patient verbally declined follow up program after this call.  Inpatient TOC had given patient resources for SDOH risks identified.  Referred patient to contact [employer] at [phone] for member services and [phone] for clinical nurse liaison number for additional resources and assistance potentials.  Patient reports living with his mother and is home alone till 5 pm.  He is using medical transport to appointments.  He is using payor benefits to obtain and pay for medications.  Patients focus on this call was mainly about wanting to change ortho providers expressing frustration with amputation being his only option really given to him.  Encouraged to reach out to PCP for HFU and discuss for another referral or to ask ortho  practice to see another provider given his strong focus on this during this call.      Patient had left AMA but returned to ED and was admitted.   Bing Edison MSN, RN RN Case Sales [facility]  [facility] Office Hours M-F [phone] Direct Dial : [phone] Main Phone  [phone]  Fax: [phone] [email or web address]    "
